# Patient Record
Sex: Male | Born: 1945
Health system: Southern US, Community
[De-identification: ages and names within clinical notes are randomized; demographics above are authoritative.]

## PROBLEM LIST (undated history)

## (undated) DIAGNOSIS — I4891 Unspecified atrial fibrillation: Secondary | ICD-10-CM

## (undated) DIAGNOSIS — E785 Hyperlipidemia, unspecified: Secondary | ICD-10-CM

## (undated) DIAGNOSIS — H269 Unspecified cataract: Secondary | ICD-10-CM

## (undated) DIAGNOSIS — Z8489 Family history of other specified conditions: Secondary | ICD-10-CM

## (undated) DIAGNOSIS — I5042 Chronic combined systolic (congestive) and diastolic (congestive) heart failure: Secondary | ICD-10-CM

## (undated) DIAGNOSIS — I10 Essential (primary) hypertension: Secondary | ICD-10-CM

## (undated) DIAGNOSIS — F419 Anxiety disorder, unspecified: Secondary | ICD-10-CM

## (undated) DIAGNOSIS — K5900 Constipation, unspecified: Secondary | ICD-10-CM

## (undated) DIAGNOSIS — I639 Cerebral infarction, unspecified: Secondary | ICD-10-CM

## (undated) DIAGNOSIS — Q246 Congenital heart block: Secondary | ICD-10-CM

## (undated) DIAGNOSIS — K635 Polyp of colon: Secondary | ICD-10-CM

## (undated) DIAGNOSIS — R011 Cardiac murmur, unspecified: Secondary | ICD-10-CM

## (undated) DIAGNOSIS — D689 Coagulation defect, unspecified: Secondary | ICD-10-CM

## (undated) DIAGNOSIS — I442 Atrioventricular block, complete: Secondary | ICD-10-CM

## (undated) DIAGNOSIS — K5790 Diverticulosis of intestine, part unspecified, without perforation or abscess without bleeding: Secondary | ICD-10-CM

## (undated) DIAGNOSIS — IMO0001 Reserved for inherently not codable concepts without codable children: Secondary | ICD-10-CM

## (undated) DIAGNOSIS — R350 Frequency of micturition: Secondary | ICD-10-CM

## (undated) DIAGNOSIS — K429 Umbilical hernia without obstruction or gangrene: Secondary | ICD-10-CM

## (undated) DIAGNOSIS — F329 Major depressive disorder, single episode, unspecified: Secondary | ICD-10-CM

## (undated) DIAGNOSIS — Z950 Presence of cardiac pacemaker: Secondary | ICD-10-CM

## (undated) DIAGNOSIS — I255 Ischemic cardiomyopathy: Secondary | ICD-10-CM

## (undated) DIAGNOSIS — E0590 Thyrotoxicosis, unspecified without thyrotoxic crisis or storm: Secondary | ICD-10-CM

## (undated) DIAGNOSIS — K222 Esophageal obstruction: Secondary | ICD-10-CM

## (undated) DIAGNOSIS — C8590 Non-Hodgkin lymphoma, unspecified, unspecified site: Secondary | ICD-10-CM

## (undated) HISTORY — DX: Frequency of micturition: R35.0

## (undated) HISTORY — DX: Essential (primary) hypertension: I10

## (undated) HISTORY — PX: COLONOSCOPY: SHX174

## (undated) HISTORY — DX: Polyp of colon: K63.5

## (undated) HISTORY — DX: Hyperlipidemia, unspecified: E78.5

## (undated) HISTORY — DX: Ischemic cardiomyopathy: I25.5

## (undated) HISTORY — DX: Cerebral infarction, unspecified: I63.9

## (undated) HISTORY — DX: Cardiac murmur, unspecified: R01.1

## (undated) HISTORY — DX: Umbilical hernia without obstruction or gangrene: K42.9

## (undated) HISTORY — DX: Congenital heart block: Q24.6

## (undated) HISTORY — DX: Non-Hodgkin lymphoma, unspecified, unspecified site: C85.90

## (undated) HISTORY — DX: Diverticulosis of intestine, part unspecified, without perforation or abscess without bleeding: K57.90

## (undated) HISTORY — DX: Coagulation defect, unspecified: D68.9

## (undated) HISTORY — PX: PACEMAKER PLACEMENT: SHX43

## (undated) HISTORY — DX: Atrioventricular block, complete: I44.2

## (undated) HISTORY — PX: TONSILLECTOMY: SUR1361

## (undated) HISTORY — DX: Esophageal obstruction: K22.2

## (undated) HISTORY — DX: Unspecified cataract: H26.9

## (undated) HISTORY — PX: OTHER SURGICAL HISTORY: SHX169

## (undated) HISTORY — DX: Unspecified atrial fibrillation: I48.91

## (undated) HISTORY — PX: COLECTOMY: SHX59

---
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
## 1996-10-14 DIAGNOSIS — C8590 Non-Hodgkin lymphoma, unspecified, unspecified site: Secondary | ICD-10-CM

## 1996-10-14 HISTORY — DX: Non-Hodgkin lymphoma, unspecified, unspecified site: C85.90

## 2000-01-23 ENCOUNTER — Encounter: Admission: RE | Admit: 2000-01-23 | Discharge: 2000-01-23 | Payer: Self-pay | Admitting: Oncology

## 2000-01-23 ENCOUNTER — Encounter: Payer: Self-pay | Admitting: Oncology

## 2001-07-15 ENCOUNTER — Other Ambulatory Visit: Admission: RE | Admit: 2001-07-15 | Discharge: 2001-07-15 | Payer: Self-pay | Admitting: Gastroenterology

## 2001-07-15 ENCOUNTER — Encounter (INDEPENDENT_AMBULATORY_CARE_PROVIDER_SITE_OTHER): Payer: Self-pay

## 2003-01-05 ENCOUNTER — Encounter: Payer: Self-pay | Admitting: Cardiovascular Disease

## 2003-01-05 ENCOUNTER — Ambulatory Visit (HOSPITAL_COMMUNITY): Admission: RE | Admit: 2003-01-05 | Discharge: 2003-01-06 | Payer: Self-pay | Admitting: Cardiovascular Disease

## 2003-01-06 ENCOUNTER — Encounter: Payer: Self-pay | Admitting: Cardiovascular Disease

## 2003-01-06 HISTORY — PX: CARDIAC CATHETERIZATION: SHX172

## 2003-11-01 ENCOUNTER — Ambulatory Visit (HOSPITAL_COMMUNITY): Admission: RE | Admit: 2003-11-01 | Discharge: 2003-11-01 | Payer: Self-pay | Admitting: Gastroenterology

## 2004-07-08 ENCOUNTER — Inpatient Hospital Stay (HOSPITAL_COMMUNITY): Admission: EM | Admit: 2004-07-08 | Discharge: 2004-07-11 | Payer: Self-pay | Admitting: Emergency Medicine

## 2004-07-09 ENCOUNTER — Encounter (INDEPENDENT_AMBULATORY_CARE_PROVIDER_SITE_OTHER): Payer: Self-pay | Admitting: Cardiology

## 2007-01-16 ENCOUNTER — Encounter: Admission: RE | Admit: 2007-01-16 | Discharge: 2007-01-16 | Payer: Self-pay | Admitting: Family Medicine

## 2007-02-09 ENCOUNTER — Ambulatory Visit (HOSPITAL_COMMUNITY): Admission: RE | Admit: 2007-02-09 | Discharge: 2007-02-09 | Payer: Self-pay | Admitting: Cardiovascular Disease

## 2007-06-25 ENCOUNTER — Encounter: Admission: RE | Admit: 2007-06-25 | Discharge: 2007-06-25 | Payer: Self-pay | Admitting: Otolaryngology

## 2007-09-03 ENCOUNTER — Ambulatory Visit: Payer: Self-pay | Admitting: Gastroenterology

## 2007-09-03 DIAGNOSIS — C8599 Non-Hodgkin lymphoma, unspecified, extranodal and solid organ sites: Secondary | ICD-10-CM

## 2007-09-16 ENCOUNTER — Encounter: Payer: Self-pay | Admitting: Gastroenterology

## 2007-09-16 ENCOUNTER — Ambulatory Visit: Payer: Self-pay | Admitting: Gastroenterology

## 2007-11-11 DIAGNOSIS — I499 Cardiac arrhythmia, unspecified: Secondary | ICD-10-CM | POA: Insufficient documentation

## 2007-11-11 DIAGNOSIS — K5730 Diverticulosis of large intestine without perforation or abscess without bleeding: Secondary | ICD-10-CM | POA: Insufficient documentation

## 2007-11-11 DIAGNOSIS — G459 Transient cerebral ischemic attack, unspecified: Secondary | ICD-10-CM | POA: Insufficient documentation

## 2007-11-11 DIAGNOSIS — K222 Esophageal obstruction: Secondary | ICD-10-CM

## 2008-05-02 ENCOUNTER — Telehealth: Payer: Self-pay | Admitting: Gastroenterology

## 2008-08-18 ENCOUNTER — Encounter: Payer: Self-pay | Admitting: Gastroenterology

## 2009-08-29 ENCOUNTER — Encounter: Payer: Self-pay | Admitting: Gastroenterology

## 2010-03-06 ENCOUNTER — Encounter: Payer: Self-pay | Admitting: Gastroenterology

## 2010-05-22 ENCOUNTER — Ambulatory Visit: Payer: Self-pay | Admitting: Gastroenterology

## 2010-05-22 DIAGNOSIS — K429 Umbilical hernia without obstruction or gangrene: Secondary | ICD-10-CM | POA: Insufficient documentation

## 2010-05-29 ENCOUNTER — Encounter: Admission: RE | Admit: 2010-05-29 | Discharge: 2010-05-29 | Payer: Self-pay | Admitting: Cardiovascular Disease

## 2010-10-12 ENCOUNTER — Encounter: Payer: Self-pay | Admitting: Gastroenterology

## 2010-11-15 NOTE — Assessment & Plan Note (Signed)
Summary: UMBILIcal hernia...em    History of Present Illness Visit Type: Initial Visit Primary GI MD: Melvia Heaps MD [facility] Requesting Jory Tanguma: N/A Chief Complaint: Umbilical hernia and abdominal tightness/constipation History of Present Illness:   Benjamin Mckay is a pleasant 65 year old white male with a history of lymphoma of the colon here for evaluation of an umbilical hernia.  Lymphoma was diagnosed and treated by resection and chemotherapy 13 years ago.  About a year ago he noted some bulging from the umbilical area.  He is without pain.  Last colonoscopy in 2008 showed diverticulosis only.  Patient has a history of an esophageal stricture.  He denies dysphagia.   GI Review of Systems    Reports abdominal pain.      Denies acid reflux, belching, bloating, chest pain, dysphagia with liquids, dysphagia with solids, heartburn, loss of appetite, nausea, vomiting, vomiting blood, weight loss, and  weight gain.      Reports change in bowel habits and  constipation.     Denies anal fissure, black tarry stools, diarrhea, diverticulosis, fecal incontinence, heme positive stool, hemorrhoids, irritable bowel syndrome, jaundice, light color stool, liver problems, rectal bleeding, and  rectal pain.    Current Medications (verified): 1)  Aspirin 81 Mg Tbec (Aspirin) .Marland Kitchen.. 1 By Mouth Once Daily 2)  Toprol Xl 50 Mg Xr24h-Tab (Metoprolol Succinate) .Marland Kitchen.. 1 By Mouth Two Times A Day 3)  Zetia 10 Mg Tabs (Ezetimibe) .Marland Kitchen.. 1 By Mouth Once Daily 4)  Coumadin 5 Mg Tabs (Warfarin Sodium) .Marland Kitchen.. 1 By Mouth Once Daily M,w,th,frid,sat 1/2 Tablet Tues and Sunday 5)  Flomax 0.4 Mg Caps (Tamsulosin Hcl) .... 1 By Mouth Once Daily 6)  Cozaar 50 Mg Tabs (Losartan Potassium) .... 1 By Mouth Once Daily 7)  Diltiazem Hcl 30 Mg Tabs (Diltiazem Hcl) .... 1 By Mouth Once Daily  Allergies (verified): 1)  ! Pcn  Past History:  Past Medical History: Reviewed history from 11/11/2007 and no changes required. Current  Problems:  ESOPHAGEAL STRICTURE (ICD-530.3) TIA (ICD-435.9) UNSPECIFIED CARDIAC DYSRHYTHMIA (ICD-427.9) NEOPLASM, MALIGNANT, COLON (ICD-153.9) Hypertension  Past Surgical History: Appendectomy Pacemaker:  Partial Colon Resection Tonsillectomy  Family History: Reviewed history and no changes required. No FH of Colon Cancer: Family History of Prostate Cancer:father  Social History: Reviewed history and no changes required. Patient has never smoked.  Alcohol Use - no Daily Caffeine Use Illicit Drug Use - no Occupation: [employer] Married   3 boys Smoking Status:  never Drug Use:  no  Review of Systems       The patient complains of arthritis/joint pain.  The patient denies allergy/sinus, anemia, anxiety-new, blood in urine, breast changes/lumps, change in vision, confusion, cough, coughing up blood, depression-new, fainting, fatigue, fever, headaches-new, hearing problems, heart murmur, heart rhythm changes, itching, muscle pains/cramps, night sweats, nosebleeds, shortness of breath, skin rash, sleeping problems, sore throat, swelling of feet/legs, swollen lymph glands, thirst - excessive, urination - excessive, urination changes/pain, urine leakage, vision changes, and voice change.         All other systems were reviewed and were negative   Vital Signs:  Patient profile:   64 year old male Height:      69 inches Weight:      216 pounds BMI:     32 .01 Pulse rate:   68 / minute Pulse rhythm:   regular BP sitting:   142 / 84  (left arm)  Vitals Entered By: Milford Cage NCMA (May 22, 2010 3:53 PM)  Physical Exam  Additional Exam:  On physical exam his healthy-appearing male  Physical Exam: General:   WDWN HEENT:   anicteric.  No pharyngeal abnormalities Neck:   No masses, thyroidmegaly Nodes:   No cervical, axillary, inguinal adenopathy Chest:    Clear to auscultation Cardiac:   No murmurs, gallops, rubs Abdomen:   BS active.  No abd masses,  tenderness, organomegaly; a button size umbilical hernia is present Rectal:   Deferred Extremities:   No cyanosis, clubbing, edema Skeletal:   No deformities Neuro:   Alert, oriented x3.  No focal abnormalities     Impression & Recommendations:  Problem # 1:  HERNIA, UMBILICAL (ICD-553.1) The patient is  asymptomatic.  At this time might do not think that surgical repair as necessary.  Problem # 2:  NEOPLASM, MALIGNANT, COLON (ICD-153.9) History of colonic lymphoma without evidence of recurrence.  Plan follow colonoscopy 2015  Problem # 3:  ESOPHAGEAL STRICTURE (ICD-530.3) Assessment: Comment Only  Problem # 4:  TIA (ICD-435.9) Assessment: Comment Only  Patient Instructions: 1)  Copy sent to : Dr Alanda Amass 2)  Call back as needed  3)  The medication list was reviewed and reconciled.  All changed / newly prescribed medications were explained.  A complete medication list was provided to the patient / caregiver.

## 2010-11-16 NOTE — Letter (Signed)
Summary: [facility]   Imported By: Sherian Rein 08/20/2010 14:26:46  _____________________________________________________________________  External Attachment:    Type:   Image     Comment:   External Document

## 2010-11-21 NOTE — Letter (Signed)
Summary: [facility]  [facility]   Imported By: Lennie Odor 11/12/2010 11:55:54  _____________________________________________________________________  External Attachment:    Type:   Image     Comment:   External Document

## 2011-02-26 NOTE — Cardiovascular Report (Signed)
NAME:  Benjamin Mckay, Benjamin Mckay               ACCOUNT NO.:  [id number]   MEDICAL RECORD NO.:  [id number]          PATIENT TYPE:  AMB   LOCATION:  SDS                          FACILITY:  [facility]   PHYSICIAN:  Nanetta Batty, M.D.   DATE OF BIRTH:  1946-05-15   DATE OF PROCEDURE:  02/09/2007  DATE OF DISCHARGE:                            CARDIAC CATHETERIZATION   PROCEDURE:  Cerebral angiogram, femoral arteriogram, distal abdominal  aortogram.   Mr. Radle is a very pleasant 65 year old gentleman with a history of  noncritical CAD, renal artery stenosis, carotid disease followed by  duplex ultrasound and complete heart block __________ pacemaker therapy.  He has had serial duplexes of his carotids and renals and presents now  for angiography of both to find his anatomy in light of progression of  the disease by duplex ultrasound.   PROCEDURE DESCRIPTION:  The patient was brought to the __________  angiographic suite in the postabsorptive state.  He was not  premedicated.  His right groin was prepped and shaved in the usual  sterile fashion.  Xylocaine 1% was used for local anesthesia.  A 5  French sheath was inserted in the right femoral artery using the  standard Seldinger technique.  A 5 French long pigtail catheter was used  for arch angiography and __________ abdominal aortography.  JB1 catheter  over a Simmons catheter was used for selective angiography of the  carotid and vertebral arteries.  Intra and extra femoral views were  taken.  __________ for the entirety of the case.  Retrograde aortic  pressures monitored at the end of the case.   ANGIOGRAPHIC RESULTS:  1. RCA aortograms:  This was a type 1 bovine arch.  2. Distal abdominal aorta:  Renal arteries appear to be at most 20-30%      stenosis, although the ostium of the right renal artery is not well-      visualized.  The inferior abdominal aorta __________  changes.  3. Right vertebral:  Dominant vessel with thermal intracranial      anatomy.  4. Right carotid:  Sixty to seventy percent mid proximal right ICA      stenosis.  The right carotid supplied the anterior cerebral.  5. Left carotid:  Widely patent internal carotid with normal      intracerebral anatomy.  6. Left vertebral:  Nondominant __________ disease.   IMPRESSION:  Mr. Wolters has moderate right internal carotid artery  stenosis in the 60-60+% range.  It does not appear to be clear enough to  warrant intervention in an asymptomatic individual.  This renal artery  likewise appears  to have noncritical disease.  Continue medical therapy with surveillance  duplex ultrasound will be recommended.  The patient will be discharged  home later today and will follow up with Dr. Alanda Amass in the office.   Sheath was removed.  Pressure was applied to achieve hemostasis.  The  patient left the lab in stable condition.      Nanetta Batty, M.D.  Electronically Signed     JB/MEDQ  D:  02/09/2007  T:  02/09/2007  Job:  [id number]  cc:   Lennis P. Darrold Span, M.D.  Pramod P. Pearlean Brownie, MD

## 2011-02-26 NOTE — Assessment & Plan Note (Signed)
[facility]                         GASTROENTEROLOGY OFFICE NOTE   NAME:Benjamin Mckay, Benjamin Mckay                      MRN:          [id number]  DATE:09/03/2007                            DOB:          [birth year]    PROBLEM:  History of colonic lymphoma.   Benjamin Mckay is a 65 year old white male here for followup colonoscopy.  He has a history of lymphoma of the colon which was treated by resection  and chemotherapy; this occurred almost 10 years ago.  He also has a  history of esophageal stricture which was dilated several years ago.  He  does have some mild dysphagia to solids.  Recent barium swallow  apparently was normal.  He has no lower GI complaints including a change  in bowel habits, abdominal pain, melena or hematochezia.   PAST MEDICAL HISTORY:  Pertinent for:  1. Hypertension.  2. Arrhythmias.  3. He has had a TIA for which he is on Coumadin.  4. He is status post appendectomy.  5. He is status post pacemaker insertion.   FAMILY HISTORY:  Pertinent for father with prostate cancer and mother  with heart disease.   MEDICATIONS:  Baby aspirin, Toprol-XL, Coumadin, Flomax and Crestor.   ALLERGIES:  He is allergic to PENICILLIN.   SOCIAL HISTORY:  He neither smokes or drinks.  He is divorced and works  in Airline pilot.   REVIEW OF SYSTEMS:  Reviewed and is negative.   PHYSICAL EXAMINATION:  VITAL SIGNS:  Pulse 66, blood pressure 124/64,  weight 210.  HEENT:  EOMI.  PERRLA.  Sclerae are anicteric.  Conjunctivae are pink.  NECK:  Supple without thyromegaly, adenopathy or carotid bruits.  CHEST:  Clear to auscultation and percussion without adventitious  sounds.  CARDIAC:  Regular rhythm; normal S1, S2.  There are no murmurs, gallops  or rubs.  ABDOMEN:  Bowel sounds are normoactive.  Abdomen is soft, nontender and  nondistended.  There are no abdominal masses, tenderness, splenic  enlargement or hepatomegaly.  EXTREMITIES:  Full range of motion.   No cyanosis, clubbing or edema.  RECTAL:  Deferred.   IMPRESSION:  1. History of lymphoma of the large intestine.  2. Recurrent dysphagia -- likely secondary to recurrent esophageal      stricture.  3. History of arrhythmias -- on Coumadin.   RECOMMENDATION:  1. Colonoscopy.  2. Esophageal dilatation (to be done at the same time as the      colonoscopy, while off Coumadin).     Barbette Hair. Arlyce Dice, MD,FACG  Electronically Signed    RDK/MedQ  DD: 09/03/2007  DT: 09/04/2007  Job #: [id number]   cc:   Brett Canales A. Cleta Alberts, M.D.  Richard A. Alanda Amass, M.D.  Lennis P. Darrold Span, M.D.

## 2011-03-01 NOTE — Cardiovascular Report (Signed)
NAME:  Benjamin Mckay, Benjamin Mckay NO.:  [id number]   MEDICAL RECORD NO.:  [id number]                   PATIENT TYPE:  OIB   LOCATION:  2856                                 FACILITY:  [facility]   PHYSICIAN:  Richard A. Alanda Amass, M.D.          DATE OF BIRTH:  [birth year]   DATE OF PROCEDURE:  01/05/2003  DATE OF DISCHARGE:                              CARDIAC CATHETERIZATION   PROCEDURE:  End-of-life generator explant with implantation of new VVIR  Pacesetter Verity ADX-XL-SR, model #5157M/S, pulse generator SN:  [id number], 6-  mm pin connector, unipolar.   PRIMARY PHYSICIAN:  Richard A. Alanda Amass, M.D.   COMPLICATIONS:  None.   ESTIMATED BLOOD LOSS:  Approximately 30 cc.   ANESTHESIA:  5 mg of Valium p.o. premedication, 1% local Xylocaine, Versed 3  mg IV, Nubain 4 mg IV, fentanyl 75 mcg IV in divided doses.   PREOPERATIVE DIAGNOSES:  1. Congenital heart block with escape junctional rhythm.  2. Past history of variable atrial inexcitability with prior atrial capture     with pacing.  3. Atrial fibrillation.  4. Recurrent junctional rhythm, intraventricular conduction delay with     symptomatic bradycardia and presyncope associated with end-of-life pulse     generator.  5. Remote PTVP, January 23, 1983, for symptomatic congenital complete heart     block with presyncope and 7-second sinus pauses (678 silicone SE 6-mm pin     connector ventricular lead, passive fixation).  6. End-of-life generator change and DDD upgrade to [facility] III,     July 18, 1994 (#2029M/S generator) with new atrial Pacesetter 1142T;     [id number] electrode.  7. Recent onset of permanent atrial fibrillation.  8. Recent positive Cardiolite for inferior ischemia, January 03, 2003.  9. Status post non-Hodgkin lymphoma, GI treated with colon resection, Dr.     Gerrit Friends, February 03, 1997.  Subsequent 3 cycles of adjuvant CHOP     chemotherapy through September 1998.  No recurrence.   Followed by Dr.     Ottie Glazier P. Livesay.   PROCEDURE:  New pulse generator Verity ADX-XL-SR Pacesetter, #5157M/S.   UNIPOLAR THRESHOLDS:  Ventricular threshold 1.3 to 1.4 volts, R wave 5-6 MV,  impedance 400 ohm.   Good R waves for sensing.   Atrial fibrillation on atrial electrograms.   PROCEDURE:  The patient recently became symptomatic with dizziness and  presyncope, and was found as an outpatient to have end-of-life generator  when seen December 31, 2002.  He was not pacer dependent and was reprogrammed  to the VVI mode, and found to be in underlying atrial fibrillation.  Severe  left atrial enlargement on 2D echocardiogram.  He was admitted for EOL  generator change.  He had not come back for followup for 2 years prior to  this visit, and had not called in on [place].   The patient was brought to the second floor CP lab in the postabsorptive  state after 5 mg of Valium p.o. premedication.  He was given vancomycin 1 g  IV on call to the laboratory for prophylaxis with history of penicillin  allergy.  The left anterior chest was prepped, draped in the usual manner.  One percent Xylocaine was used for local anesthesia.  X-rays were reviewed  and fluoroscopy was reviewed with no lead fractures seen.  The patient was  backup pacing VVI.  A left infraclavicular curvilinear transverse incision  was performed above the previous scar, and above the pulse generator.  Electrocautery was used sparingly to control hemostasis, and blunt  dissection was used to expose the fibrous generator capsule.  The generator  capsule was incised and the generator was freed up using blunt dissection,  and delivered from the pocket.  The patient had underlying junctional and/or  ventricular escape rhythm intermittently, but was temporarily paced through  the ventricular electrode.  Inspection of the ventricular electrode showed  it to be intact.  A 6-mm pin connector silicone __________ LIMA as outlined   above.  The atrial electrode was also intact and had 2 upsizing sleeves on  it that were both removed.  Atrial electrograms were done showing atrial  fibrillation.  The atrial electrode was capped with a silicone cap and two  #1 silk suture ties.  Ventricular thresholds were performed showing adequate  R waves and good chronic thresholds.  The pocket was irrigated with 500 mg  of kanamycin solution.  The new generator was hooked to the [phone] silicone  ventricular electrode, and a single hex nut tightened.  Some silicone glue  was used around the pin connector insulation to ensure good integrity.  The  generator was delivered into the pocket.  Sponge count was correct.  The  subcutaneous tissue was closed with 2 separate running layers of 2-0 Dexon  suture, and the skin was closed with 5-0 subcuticular Dexon sutures.  Steri-  Strips were applied.  Fluoroscopy showed good position of the electrode and  generator.  The patient tolerated the procedure well.  He was VVIR pacing at  the end of the procedure, and reprogrammed in the lab.  Repeat threshold was  1.3 to 1.4 volts.   Magnet rate of BOL equals 100, magnet rate of EOL drops to 85, and voltage  will drop to less than 2.45 volts.   This is a unipolar generator because of the patient's unipolar electrode.                                                 Richard A. Alanda Amass, M.D.    RAW/MEDQ  D:  01/05/2003  T:  01/06/2003  Job:  [id number]   cc:   Darlin Priestly, M.D.  [phone] [address]  [place]  [place] [address]  Fax: [phone]   Lennis P. Darrold Span, M.D.  [address] [facility]  [place]  [place] [address]  Fax: [phone]

## 2011-03-01 NOTE — Discharge Summary (Signed)
NAMEKELSIE, Benjamin Mckay               ACCOUNT NO.:  [id number]   MEDICAL RECORD NO.:  [id number]          PATIENT TYPE:  INP   LOCATION:  3034                         FACILITY:  [facility]   PHYSICIAN:  Pramod P. Pearlean Brownie, MD    DATE OF BIRTH:  November 19, 1945   DATE OF ADMISSION:  07/07/2004  DATE OF DISCHARGE:  07/11/2004                                 DISCHARGE SUMMARY   DISCHARGE DIAGNOSES:  1.  Left hemispheric transient ischemic attacks.  2.  Atrial fibrillation.  3.  Hypertension.  4.  Hyperlipidemia.  5.  History of colon lymphoma.  6.  Pacemaker.  7.  Status post appendectomy.  8.  Status post C-spine surgery.  9.  Status post pacemaker surgery.   STUDIES PERFORMED:  1.  CT of the head on admission showed no acute intracranial abnormality.      CT angio of the head and neck showed moderate stenosis of the proximal      right internal carotid artery about 70%.  Bovine art anatomy, no      stenosis on the left and cannot completely exclude mild stenosis within      the proximal left MCA in one segment.  May consider conventional      angiography.  2.  EKG shows electronic ventricular pacemaker, no old tracing to compare.  3.  Transesophageal echocardiogram performed by Dr. Cristy Hilts. Ganji shows EF 55      to 65% with left atrial appendage, function severely reduced.      Spontaneous echo contrast  or smoke in the left atrial appendage.  It is      felt that reduced LA and LAA function along with the presence of      spontaneous contrast is probably etiology for TIA.  4.  Carotid Doppler with right 60 to 80% ICA stenosis in the mid to upper      end of the scale.  No significant left ICA stenosis.  Vertebral artery      flow antegrade bilaterally.   LABORATORY DATA:  Antithrombin III 99.  ANA negative.  Lupus anticoagulant  not detected.  Factor V Leiden pending.  Hemoglobin A1C 5.4.  Homocystine  10.6.  Lipid profile with cholesterol 172, triglycerides 53, HDL 35 and LDL  126.  TSH was  normal.  Urine drug screen was negative.  CBC normal.  Differential normal except for elevated neutrophils of 80 on admission.  Coagulation studies normal on admission.  Chemistry with glucose 117,  otherwise normal.  Liver function tests normal.  INR the day of discharge  2.8.   HISTORY OF PRESENT ILLNESS:  [name] is a 65 year old right-handed  white male who had recurrent episodes of right body numbness, weakness and  slurred speech the evening of admission.  His symptoms began at 5 p.m. and  for the next five hours, had recurrent episodes of transient five to 10  minute right-sided body weakness, numbness and slurred speech, each lasting  five to 10 minutes.  These episodes occurred multiple times.  When neurology  saw him he had been in the  emergency room two to three hours with no  recurrent symptoms.  He denied previous history of stroke, TIA, blurred  vision or other neurologic problem.  He was not a SAINT-II or TPA candidate  secondary to quick resolution of symptoms.   HOSPITAL COURSE:  Actual stroke could not be documented as the patient could  not have MRI secondary to pacemaker.  It was thought his TIA symptoms were  directly correlated with atrial fibrillation.  The patient was started on IV  heparin to Coumadin.  Cardiology consult with Dr. Alanda Amass, cardiologist,  was obtained.  TEE was performed and did verify atrial smoke and  fibrillation which would probably be etiology of TIAs.  Plans are for  Coumadin, keep INR between 2 and 3 with aspirin.  Dr. Alanda Amass to follow  up.  Neurologically the patient's deficits have resolved and plans are to  discharge him home with return to work next week.  He needs tight risk  factor control.   CONDITION ON DISCHARGE:  Neurologically normal.   PLAN:  1.  Discharge home with family.  Return to work on Tuesday, July 17, 2004.  2.  Coumadin for secondary stroke prevention.  INR goal 2 to 3.  Dr. Pearletha Furl.  Alanda Amass to follow for lab work on Friday and Monday.  3.  Follow-up with Dr. Janalyn Shy P. Sethi in two months.  Call for an      appointment.   DISCHARGE MEDICATIONS:  1.  Coumadin 5 mg a day.  2.  Toprol 50 mg a day.  3.  Lipitor 80 mg a day.  4.  Aspirin 81 mg a day.       SB/MEDQ  D:  07/11/2004  T:  07/11/2004  Job:  [id number]   cc:   Pramod P. Pearlean Brownie, MD  Fax: [phone]   Lennis P. Darrold Span, M.D.  [address] [facility]  [place]  [place] [address]  Fax: [phone]   Richard A. Alanda Amass, M.D.  [phone] [address], [address]  [place]  [place] [address]  Fax: [phone]

## 2011-03-01 NOTE — Discharge Summary (Signed)
NAME:  Benjamin Mckay, Benjamin Mckay NO.:  [id number]   MEDICAL RECORD NO.:  [id number]                   PATIENT TYPE:  OIB   LOCATION:  3712                                 FACILITY:  [facility]   PHYSICIAN:  Richard A. Alanda Amass, M.D.          DATE OF BIRTH:  May 11, 1946   DATE OF ADMISSION:  01/05/2003  DATE OF DISCHARGE:  01/06/2003                                 DISCHARGE SUMMARY   DISCHARGE DIAGNOSES:  1. End-of-life pacemaker, status post generator change this admission.  2. Moderate coronary disease by catheterization this admission.  3. Mild left ventricular dysfunction with an ejection fraction of 45-50% at     catheterization this admission.  4. Peripheral vascular disease with greater than 80% right renal artery     stenosis by angiogram this admission.  5. Treated hyperlipidemia.  6. PENICILLIN allergy.  7. History of colon cancer, status post colectomy in 1998.   HOSPITAL COURSE:  The patient is a 65 year old male followed by Dr.  Alanda Amass with a history of congenital heart block.  He had his initial  pacer in April 1984.  He had a generator upgrade to a pacesetter synchrony  October 1995.  He was recently seen in the office and found to be near end-  of-life.  He is admitted electively now for generator change.  He also has a  family history of coronary disease and he had a Cardiolite study done on  January 03, 2003, which was abnormal with suggestion of inferior ischemia.  It  was decided to proceed with diagnostic catheterization also during this  admission.   The patient was admitted January 05, 2003, as an outpatient for generator  change.  He had a new VVIR pacesetter Verity ADx XL implanted by Dr.  Alanda Amass without complications.  He was set up for diagnostic angiogram  which was done January 06, 2003.  This revealed a 40-50% RCA, 40% circumflex,  80% diagonal, 70% mid LAD with an EF of  45-50% and an incidental finding of  an 80% right renal  artery stenosis.   The plan is for medical therapy at this time.  He will have renal Dopplers  as an outpatient.  Beta-blocker and Plavix were added.  Dr. Alanda Amass will  adjust Coumadin when he sees him as an outpatient.  He was in underlying  atrial fibrillation when his generator was placed.  The patient is  discharged January 06, 2003, in the evening.  He has been instructed that he  should not work x1 week.   DISCHARGE MEDICATIONS:  1. Aspirin 81 mg daily.  2. Plavix 75 mg daily.  3. Lipitor 20 mg daily.  4. Toprol XL 50 mg daily.   LABORATORY DATA:  White count 5.9, hemoglobin 16, hematocrit 47.5, platelets  196. INR 1.08. TSH 1.27.  BUN 16, creatinine 0.8, sodium 137, potassium 4.8.  Liver functions are normal.  Cholesterol is 185, HDL 45,  LDL 131. Urinalysis  is unremarkable.    DISPOSITION:  The patient is discharged in stable condition and will follow  up with Dr. Alanda Amass on January 24, 2003, at 10:30 a.m.  He will have renal  Dopplers as an outpatient January 17, 2003, at 10 a.m.     Abelino Derrick, P.A.-C                    Richard A. Alanda Amass, M.D.    Lenard Lance  D:  01/06/2003  T:  01/07/2003  Job:  [id number]

## 2011-03-01 NOTE — Cardiovascular Report (Signed)
NAME:  Benjamin Mckay, Benjamin Mckay NO.:  [id number]   MEDICAL RECORD NO.:  [id number]                   PATIENT TYPE:  OIB   LOCATION:  3712                                 FACILITY:  [facility]   PHYSICIAN:  Richard A. Alanda Amass, M.D.          DATE OF BIRTH:  May 30, 1946   DATE OF PROCEDURE:  01/06/2003  DATE OF DISCHARGE:                              CARDIAC CATHETERIZATION   PROCEDURES:  Retrograde central aortic catheterization, selective coronary angiography by  Judkins technique, pre and post intracoronary nitroglycerin administration,  left ventricular angiogram by RAO and LAO projection, subselective left  internal mammary artery and right internal mammary artery, abdominal aortic  angiogram midstream projection, selective right and left renal angiograms by  hand injection.   DESCRIPTION OF PROCEDURE:  The patient brought to the second floor CP lab in the postabsorptive state  after 5 mg of Valium p.o. premedication.  He was given 2 mg of Versed for  sedation, 2 mg of Nubain for sedation during the procedure.  The right groin  was prepped, draped in the usual manner.  One percent Xylocaine was used for  local anesthesia.  The RFA was entered with a single puncture using an 18  thin wall needle and a 6-French short Daig side arm sheath was inserted  without difficulty.  Catheterization was done with a 6-French 4-cm taper.  Preformed coronary and pigtail catheters using nonionic dye throughout the  procedure.  LV angiogram was done in the RAO and LAO projections, 25 mL at  14 mL per second, 20 mL 12 mL per second.  Pullback pressure of the CA was  performed.  It showed no gradient across the aortic valve.  Subselective  LIMA and RIMA were done with the right coronary catheter. Abdominal  angiogram was done in the midstream.  Projection at 25 mL 20 mL per second.  Selective left and right renal angiography was done by hand injection with  the right coronary  artery catheter.  The catheter was removed.  Side arm  sheath was flushed.  The patient was given 200 mcg of IC nitroglycerin in  the left coronary with repeat injections done in the lab.  Catheters were  removed.  Side arm sheath was flushed.  The patient tolerated the procedure  well and he was transferred to the holding area for sheath removal and  pressure hemostasis.   PRESSURES:  1. LV:  132/0 mmHg.   1. CA:  132/72 mmHg.   1. There is no gradient across the aortic valve on a catheter pullback.   1. Pressures were done post IC nitroglycerin.  Prior to this, they were 150     and 160 systolic.   LEFT VENTRICULAR ANGIOGRAM:  LV angiogram demonstrated a pacemaker-induced left bundle-branch block  pattern.  There was a synchronous contraction of the septum and hypokinesis  of the distal septum, apical area, and mid inferior wall.  It is  difficult  to tell how much of this was pacemaker-induction related.  Overall EF is  approximately 45% to 50%, and there was no mitral regurgitation.  The  patient was VVI pacing during the procedure.   ABDOMINAL ANGIOGRAM:  Abdominal angiogram demonstrated mild infrarenal atherosclerotic disease,  patent celiac, SMA, and IMAs.  There was borderline LRAS and suspected  significant RRIS on abdominal angiograms and selectives were performed.  This revealed 20% to 30% narrowing of the left single renal artery.  The  right renal artery had a small upper pole branch that was normal, and a very  large middle and lower pole branch (accessory) that had a greater than 80%  ostial and proximal eccentric stenosis.   FLUOROSCOPY:  Fluoroscopy showed good position of the patient's atrial and ventricular  pacer wires that had been previously placed.   There was +1 to +2 coronary calcification seen.   CORONARY ANGIOGRAPHY:  The main left coronary had no significant stenosis.   The left anterior descending artery had irregularities throughout the   proximal third without high grade stenosis.  There was an area of at least  70% narrowing on worst views at the junction of the proximal and mid third  at the level of the small third diagonal branch.  This was eccentric best  seen in the LAO views, and there was good flow.  The LAD beyond this showed  40% irregularity and narrowing in the mid portion and in the junction of the  distal third.  There was good flow in the apex where the vessel bifurcated.   The first diagonal was a small vessel, thin, with an 80% ostial proximal  lesion.   The second diagonal after the SP2 was moderately large, bifurcated, and had  40% proximal narrowing.   The third diagonal was a small thin branch that arose just at the area of  70% LAD narrowing with good flow.   There was a small D branch that was normal.   The circumflex had 40% narrowing in the proximal third, and another  eccentric 40% narrowing before the small OM1.  There was another 40%  irregularity and narrowing in the mid circumflex.  The OM1 had 50% narrowing  which was relatively small.  The OM2 was very small, and the OM3 was  moderate sized, and the distal circumflex bifurcated into a large branch.  The PABG branch from the mid circumflex also gave an LA branch off with no  significant stenosis.   The right coronary was a dominant vessel.  There was approximately 40%  narrowing without catheter dampening in the ostial and very proximal  portion.  Flush injections as well as selective injections and pullbacks  were performed.  There was no catheter damping.   The remainder of the right coronary had no significant stenosis.  There was  a small-to-moderate sized PVA and small bifurcating PLA branch.   DISCUSSION:  The patient's history is well outlined.  He is a 65 year old divorced father  of 3 with 3 grandchildren.  He has congenital heart block which was symptomatic with significant sinus pauses over 7 seconds, prompting VVI   pacer in 1984.  His son has the same problem and has a pacer as well.   The patient had his first pacer January 23, 1983.  He then had a DDD upgrade  on July 18, 1994, when he did have atrial excitability electrically,  although P waves were not seen on the surface EKG.  He  was able to pace DDD  and subsequently VDD.  However, he has an underlying junctional rhythm and  on recent evaluations, has been found to be in atrial fibrillation, when, on  office visit of December 31, 2002, he had end-of-life pulse generator.  He was  admitted because of symptoms of dizziness and presyncope with junctional  bradycardia and intermittent loss of capture secondary to severe generator  battery depletion (the patient had not returned for followup in 2 years or  [place] despite prompting).  On January 05, 2003, he underwent end-of-life  generator explant.  He was found to be in atrial fibrillation.  The atrial  lead was capped.  His 678 silicone segment to LIMA, 6-mm lead was retained  because of excellent chronic thresholds, and a new VVIR generator was  implanted in the left side without problems.  In preparation for this he was  found to have hyperlipidemia (is a nonsmoker) with LDL of 130, cholesterol  less than 200, HDL of 45.  A Cardiolite showed inferior ischemia by  Persantine technique.  It was felt best to proceed with diagnostic  catheterization in this setting.   The patient also has a history of prior non-Hodgkin lymphoma, treated with a  colon resection February 03, 1997, by Dr. Velora Heckler, with subsequent CHOP  through September 1998 with no recurrence, under the followup and care of  Dr. Ottie Glazier P. Livesay.   The patient has diffuse atherosclerotic coronary disease, the majority of  which is not critical or angiographically significant.  He does, however,  have a borderline lesion of the LAD at the junction of the mid third near  the third diagonal.  He does have diffuse atherosclerotic  changes of his  arteries as outlined.  He has high-grade right renal artery stenosis with  moderate hypertension, and normal renal function.   At this point, I would recommend medical therapy.  I would institute aspirin  and Plavix, since his pacer site is stable, and this was not a new pacer but  a generator change (less chance of bleeding).  The patient will need to be  assessed for possible need for Coumadin therapy with his underlying atrial  enlargement and atrial fibrillation, which is chronic and permanent (age  28).  We will institute statin therapy as well.  We will schedule him for  outpatient Dopplers to assess his renal artery stenosis and follow him up  medically.  All of these findings will be explained in detail to the patient  and his family.   CATHETERIZATION DIAGNOSES:  1. Atherosclerotic heart disease, positive Cardiolite for inferior ischemia,    January 03, 2003, possibly related to pacemaker-induced left bundle-branch     block.   1. Mild-to-moderate diffuse coronary artery disease, borderline left     anterior descending lesion, asymptomatic.   1. Ejection fraction of 45% to 50% with pacer-induced left bundle-branch     block.   1. Mild-to-moderate hypertension, probable high-grade right renal artery     stenosis.   1. Status post _______ with atrial paralysis, January 23, 1983, end-of-life     generator July 18, 1994, end-of-life generator January 05, 2003.   1. History of non-Hodgkin lymphoma treated with colon resection, February 03, 1997, subsequent chemotherapy with CHOP x3, disease-free.   1.     Nonsmoker.   1. Hyperlipidemia.  Richard A. Alanda Amass, M.D.    RAW/MEDQ  D:  01/06/2003  T:  01/07/2003  Job:  [id number]   cc:   Darlin Priestly, M.D.  [phone] [address]  [place]  [place] [address]  Fax: [phone]   Lennis P. Darrold Span, M.D.  [address] [facility]  [place]  [place] [address]  Fax:  [phone]   [facility]

## 2011-03-01 NOTE — H&P (Signed)
Benjamin Mckay, Benjamin Mckay               ACCOUNT NO.:  [id number]   MEDICAL RECORD NO.:  [id number]          PATIENT TYPE:  EMS   LOCATION:  MAJO                         FACILITY:  [facility]   PHYSICIAN:  Pramod P. Pearlean Brownie, MD    DATE OF BIRTH:  July 29, 1946   DATE OF ADMISSION:  07/07/2004  DATE OF DISCHARGE:                                HISTORY & PHYSICAL   REFERRING PHYSICIAN:  Paula Libra, M.D.   REASON FOR REFERRAL:  TIA.   HISTORY OF PRESENT ILLNESS:  Benjamin Mckay is a 65 year old, pleasant,  Caucasian male who developed recurrent episodes of right body numbness,  weakness, and slurred speech this evening.  His symptoms began at 5 p.m. and  for the next five hours he had recurrent episodes of transient, 5 to 10  minutes, right sided body weakness, numbness, and slurred speech lasting 5  to 10 minutes.  This occurred multiple times.  Since the last 2-3 hours, he  has been in the emergency room he has had no recurrent symptoms.  He denies  any prior history of strokes, TIAs, blurred vision, focal extremity weakness  or numbness.   PAST MEDICAL HISTORY:  1.  Hypertension.  2.  Hyperlipidemia.  3.  Pacemaker.  4.  Atrial fibrillation.  5.  Colon lymphoma, status post surgery and chemo.   PAST SURGICAL HISTORY:  1.  Appendectomy.  2.  C spine surgery.  3.  Pacemaker surgery.   MEDICATION ALLERGIES:  None.   HOME MEDICATIONS:  Aspirin, Plavix, Lipitor, and Toprol.   SOCIAL HISTORY:  The patient works full time.  He does not smoke or drink.   CARDIOLOGIST:  Richard A. Alanda Mckay, M.D.   REVIEW OF SYSTEMS:  Not significant for any chest pain, shortness of breath,  cough, diarrhea, or fatigue.   PHYSICAL EXAMINATION:  GENERAL:  Reveals a pleasant middle-aged gentleman  not in distress.  VITAL SIGNS:  Afebrile, pulse rate is 68 per minute irregular, blood  pressure 137/89, respiratory rate 18 per minute, sat is 97% on room air.  Distal pulses well felt.  HEAD:  Nontraumatic.  NECK:   Supple without bruit.  ENT:  Unremarkable.  CARDIAC:  Normal.  No murmur or gallop.  LUNGS:  Clear to auscultation.  NEUROLOGIC:  He is awake, alert, oriented x 3 with normal speech and  language function.  There is no aphasia, apraxia, or dysarthria.  Pupils  equal reactive to light and accommodation.  Visual acuity and fields  adequate.  Face is symmetric except for minimal right lower facial  asymmetry.  Tongue is in midline.  Palate elevates normally.  Motor system  exam reveals normal upper extremity drift.  Symmetric strength, tone,  reflexes, coordination, sensation.  Plantars are both downgoing.  Finger-to-  nose and alternating  coordination accurate.  Gait was not tested.   DATA REVIEWED:  Non-contrast CAT scan of the head, done today, reveals no  evidence of acute infarction.   An EKG reveals no evidence of acute ischemia.   A comprehensive metabolic panel, CBC with diff, and coagulation labs and  urine tox screen  are all normal.   IMPRESSION:  A 65 year old gentleman with recurrent left hemispheric  transient ischemic attacks with atrial fibrillation likely of cardioembolic  etiology.   PLAN:  The patient is being admitted to the stroke team.  He will be started  on IV heparin for short term anticoagulation, switched to p.o. Warfarin.  I  strongly counseled the patient to consider long term Warfarin for  anticoagulation given his history of Afib and TIAs.  The patient appears to  have some apprehension related to his job.  We will consult with his  cardiologist, Dr. Alanda Mckay, also to give his input about his need for long  term anticoagulation.  We can not obtain an MRI secondary to his pacemaker,  hence, we will obtain CT angiogram to image his vasculature as well as  carotid and transcranial Doppler studies.  Check fasting lipid profile,  hemoglobin A1c, and homocystine.  I had a long discussion with the patient  and his daughter regarding his symptoms.  Discussed  the plan, further  evaluation and treatment and answered questions.       PPS/MEDQ  D:  07/08/2004  T:  07/08/2004  Job:  [id number]   cc:   Benjamin Mckay, M.D.  [phone] [address]  [place]  [place] [address]  Fax: [phone]

## 2011-04-25 ENCOUNTER — Telehealth: Payer: Self-pay | Admitting: Gastroenterology

## 2011-04-25 NOTE — Telephone Encounter (Signed)
Pt states he has a h/o colon cancer. Pt reports he has been having rectal bleeding and blood in his stools for the past 2 months. He has been having abdominal pain also. Requesting appt with Dr. Arlyce Dice. Appt given for pt to see Dr. Arlyce Dice 04/26/11@9am . Pt aware of appt date and time.

## 2011-04-26 ENCOUNTER — Encounter: Payer: Self-pay | Admitting: Gastroenterology

## 2011-04-26 ENCOUNTER — Ambulatory Visit (INDEPENDENT_AMBULATORY_CARE_PROVIDER_SITE_OTHER): Payer: Medicare Other | Admitting: Gastroenterology

## 2011-04-26 VITALS — BP 136/82 | HR 68 | Ht 69.0 in | Wt 217.8 lb

## 2011-04-26 DIAGNOSIS — K222 Esophageal obstruction: Secondary | ICD-10-CM

## 2011-04-26 DIAGNOSIS — K625 Hemorrhage of anus and rectum: Secondary | ICD-10-CM

## 2011-04-26 MED ORDER — HYDROCORTISONE ACETATE 25 MG RE SUPP: 25.0000 mg | Freq: Two times a day (BID) | RECTAL | Status: DC

## 2011-04-26 MED ORDER — PEG-KCL-NACL-NASULF-NA ASC-C 100 G PO SOLR: 1.0000 | Freq: Once | ORAL | Status: DC

## 2011-04-26 NOTE — Patient Instructions (Signed)
Colonoscopy A colonoscopy is an exam to evaluate your entire colon. In this exam, your colon is cleansed. A long fiberoptic tube is inserted through your rectum and into your colon. The fiberoptic scope (endoscope) is a long bundle of enclosed and very flexible fibers. These fibers transmit light to the area examined and send images from that area to your caregiver. Discomfort is usually minimal. You may be given a drug to help you sleep (sedative) during or prior to the procedure. This exam helps to detect lumps (tumors), polyps, inflammation, and areas of bleeding. Your caregiver may also take a small piece of tissue (biopsy) that will be examined under a microscope. BEFORE THE PROCEDURE  A clear liquid diet may be required for 2 days before the exam.   Liquid injections (enemas) or laxatives may be required.   A large amount of electrolyte solution may be given to you to drink over a short period of time. This solution is used to clean out your colon.   You should be present 1 prior to your procedure or as directed by your caregiver.   Check in at the admissions desk to fill out necessary forms if not preregistered. There will be consent forms to sign prior to the procedure. If accompanied by friends or family, there is a waiting area for them while you are having your procedure.  LET YOUR CAREGIVER KNOW ABOUT:  Allergies to food or medicine.  Medicines taken, including vitamins, herbs, eyedrops, over-the-counter medicines, and creams.   Use of steroids (by mouth or creams).   Previous problems with anesthetics or numbing medicines.   History of bleeding problems or blood clots.  Previous surgery.   Other health problems, including diabetes and kidney problems.   Possibility of pregnancy, if this applies.   AFTER THE PROCEDURE  If you received a sedative and/or pain medicine, you will need to arrange for someone to drive you home.   Occasionally, there is a little blood passed  with the first bowel movement. DO NOT be concerned.  HOME CARE INSTRUCTIONS  It is not unusual to pass moderate amounts of gas and experience mild abdominal cramping following the procedure. This is due to air being used to inflate your colon during the exam. Walking or a warm pack on your belly (abdomen) may help.   You may resume all normal meals and activities after sedatives and medicines have worn off.   Only take over-the-counter or prescription medicines for pain, discomfort, or fever as directed by your caregiver. DO NOT use aspirin or blood thinners if a biopsy was taken. Consult your caregiver for medicine usage if biopsies were taken.  FINDING OUT THE RESULTS OF YOUR TEST Not all test results are available during your visit. If your test results are not back during the visit, make an appointment with your caregiver to find out the results. Do not assume everything is normal if you have not heard from your caregiver or the medical facility. It is important for you to follow up on all of your test results. SEEK IMMEDIATE MEDICAL CARE IF:  You pass large blood clots or fill a toilet with blood following the procedure. This may also occur 10 to 14 days following the procedure. This is more likely if a biopsy was taken.   You develop abdominal pain that keeps getting worse and cannot be relieved with medicine.  Document Released: 09/27/2000 Document Re-Released: 12/25/2009 [facility] Patient Information 2011 [place], [place]. Your colonoscopy/Endoscopy is scheduled on 05/15/2011 at  2pm. You can pick up your MoviPrep from your pharmacy today We will contact Dr Alanda Amass about you holding your coumadin

## 2011-04-26 NOTE — Assessment & Plan Note (Addendum)
Patient is symptomatic again. Plan repeat dilatation. Coumadin will be held if agreed upon by his cardiologist.

## 2011-04-26 NOTE — Assessment & Plan Note (Addendum)
I suspect his bleeding is from hemorrhoids. A more  proximal colonic bleeding source should be ruled out.  Recommendations #1 colonoscopy #2 Anusol a.c. suppositories #3 band ligation of hemorrhoids if he continues to bleed despite therapy and is felt to be due to hemorrhoids

## 2011-04-26 NOTE — Progress Notes (Signed)
History of Present Illness:  Benjamin Mckay has returned for evaluation of dysphagia and rectal bleeding. He has a history of an esophageal stricture and is now complaining of dysphagia to solids. He's also experiencing rectal bleeding consisting of bright red blood per rectum on the toilet tissue and mixed with the stools. There has been no other change in his bowel habits. He is status post partial colonic resection for colonic lymphoma.  The patient is on Coumadin for atrial fibrillation.    Review of Systems: Pertinent positive and negative review of systems were noted in the above HPI section. All other review of systems were otherwise negative.    Current Medications, Allergies, Past Medical History, Past Surgical History, Family History and Social History were reviewed in [employer] electronic medical record  Vital signs were reviewed in today's medical record. Physical Exam: General: Well developed , well nourished, no acute distress Head: Normocephalic and atraumatic Eyes:  sclerae anicteric, EOMI Ears: Normal auditory acuity Mouth: No deformity or lesions Lungs: Clear throughout to auscultation Heart: Regular rate and rhythm; no murmurs, rubs or bruits Abdomen: Soft, non tender and non distended. No masses, hepatosplenomegaly or hernias noted. Normal Bowel sounds Rectal: There are no external lesions Musculoskeletal: Symmetrical with no gross deformities  Pulses:  Normal pulses noted Extremities: No clubbing, cyanosis, edema or deformities noted Neurological: Alert oriented x 4, grossly nonfocal Psychological:  Alert and cooperative. Normal mood and affect

## 2011-05-08 ENCOUNTER — Telehealth: Payer: Self-pay | Admitting: *Deleted

## 2011-05-08 NOTE — Telephone Encounter (Signed)
RECEIVED DOCUMENTATION FROM DR Alanda Amass OK FOR PT TO HOLD COUMADIN   PT INFORMED

## 2011-05-08 NOTE — Telephone Encounter (Signed)
Message copied by Marlowe Kays on Wed May 08, 2011  2:23 PM ------      Message from: Marlowe Kays      Created: Fri Apr 26, 2011  9:44 AM      Regarding: coumadin       Procedure 8-1   Needs to come off coumadin waiting on Dr Alanda Amass

## 2011-05-08 NOTE — Telephone Encounter (Signed)
CALLED DR Mancel Parsons OFFICE TO EXPLAIN THAT WE HAVE NOT HAD A RESPONSE BACK ABOUT PTS COUMADIN..  NURSE TO CALL ME BACK

## 2011-05-15 ENCOUNTER — Ambulatory Visit (AMBULATORY_SURGERY_CENTER): Payer: Medicare Other | Admitting: Gastroenterology

## 2011-05-15 ENCOUNTER — Encounter: Payer: Self-pay | Admitting: Gastroenterology

## 2011-05-15 VITALS — BP 150/96 | HR 69 | Temp 97.3°F | Resp 18 | Ht 69.0 in | Wt 210.0 lb

## 2011-05-15 DIAGNOSIS — K2970 Gastritis, unspecified, without bleeding: Secondary | ICD-10-CM

## 2011-05-15 DIAGNOSIS — D126 Benign neoplasm of colon, unspecified: Secondary | ICD-10-CM

## 2011-05-15 DIAGNOSIS — K2940 Chronic atrophic gastritis without bleeding: Secondary | ICD-10-CM

## 2011-05-15 DIAGNOSIS — K222 Esophageal obstruction: Secondary | ICD-10-CM

## 2011-05-15 DIAGNOSIS — K625 Hemorrhage of anus and rectum: Secondary | ICD-10-CM

## 2011-05-15 MED ORDER — SODIUM CHLORIDE 0.9 % IV SOLN: 500.0000 mL | INTRAVENOUS | Status: DC

## 2011-05-15 MED ORDER — PANTOPRAZOLE SODIUM 40 MG PO TBEC: 40.0000 mg | DELAYED_RELEASE_TABLET | Freq: Every day | ORAL | Status: DC

## 2011-05-15 NOTE — Progress Notes (Signed)
Per the pt his last dose of coumadin was Friday.  Dr. Arlyce Dice made aware. MAW  Pt tolerated both colonoscopy and egd well. MAW

## 2011-05-15 NOTE — Patient Instructions (Addendum)
OV 4-6 weeks Discharge instructions given with verbal understanding. Handouts on polyps , diverticulosis, gastritis and a dilatation diet given. Resume Coumadin in the morning. Resume previous medications today.

## 2011-05-16 ENCOUNTER — Telehealth: Payer: Self-pay | Admitting: *Deleted

## 2011-05-16 NOTE — Telephone Encounter (Signed)
Called home number which was the number left in admitting yesterday and no identifiers so no message left. E Salma Walrond rn

## 2011-07-03 ENCOUNTER — Ambulatory Visit: Payer: Medicare Other | Admitting: Gastroenterology

## 2011-08-21 ENCOUNTER — Encounter: Payer: Self-pay | Admitting: Gastroenterology

## 2011-08-21 ENCOUNTER — Ambulatory Visit (INDEPENDENT_AMBULATORY_CARE_PROVIDER_SITE_OTHER): Payer: Medicare Other | Admitting: Gastroenterology

## 2011-08-21 DIAGNOSIS — K625 Hemorrhage of anus and rectum: Secondary | ICD-10-CM

## 2011-08-21 DIAGNOSIS — K222 Esophageal obstruction: Secondary | ICD-10-CM

## 2011-08-21 DIAGNOSIS — K429 Umbilical hernia without obstruction or gangrene: Secondary | ICD-10-CM

## 2011-08-21 DIAGNOSIS — K219 Gastro-esophageal reflux disease without esophagitis: Secondary | ICD-10-CM | POA: Insufficient documentation

## 2011-08-21 MED ORDER — HYDROCORTISONE ACETATE 25 MG RE SUPP: 25.0000 mg | Freq: Two times a day (BID) | RECTAL | Status: DC

## 2011-08-21 MED ORDER — PANTOPRAZOLE SODIUM 40 MG PO TBEC: 40.0000 mg | DELAYED_RELEASE_TABLET | Freq: Every day | ORAL | Status: DC

## 2011-08-21 NOTE — Assessment & Plan Note (Signed)
This is a due to rectal bleeding exacerbated by anticoagulation therapy.  Recommendations #1  Anusol suppositories

## 2011-08-21 NOTE — Patient Instructions (Signed)
We are sending in your prescription to your pharmacy 

## 2011-08-21 NOTE — Assessment & Plan Note (Signed)
He is symptomatic again off Protonix.  Recommendations #1 resume Protonix daily for at least 2 weeks and then attempt to decrease frequency

## 2011-08-21 NOTE — Assessment & Plan Note (Signed)
No further therapy at this time

## 2011-08-21 NOTE — Progress Notes (Signed)
Benjamin Mckay has returned following upper and lower endoscopy. Colonoscopy in August, 2012 demonstrated a diminutive non-adenomatous polyp and a normal anastomosis. An early esophageal stricture was dilated at endoscopy in August. Mild esophagitis was seen as well as gastritis.  He has been complaining of intermittent rectal bleeding. At one point he had almost daily bowel movements  mixed with blood. With suppositories bleeding has subsided. He felt well on Protonix. Since discontinuing this he developed some bloating and discomfort. He has some difficulty with swallowing with a sensation in his neck of fullness but not dysphagia, per se.  He remains on Coumadin. He is status post partial colonic resection for colonic lymphoma.  He also has a small umbilical hernia. It is not hurting him although he feels the area is tight postprandially.  On PE there is a 2-3cm reducible umbilical hernia

## 2011-08-21 NOTE — Assessment & Plan Note (Addendum)
Difficulties with swallowing maybe secondary to reflux. I doubt he has a recurrent stricture.  Recommendations #1 resume Protonix #2 if symptoms do not improve after at least 2 weeks of Protonix I will obtain a barium swallow

## 2011-10-01 ENCOUNTER — Ambulatory Visit (INDEPENDENT_AMBULATORY_CARE_PROVIDER_SITE_OTHER): Payer: Medicare Other

## 2011-10-01 DIAGNOSIS — J209 Acute bronchitis, unspecified: Secondary | ICD-10-CM

## 2011-10-06 ENCOUNTER — Ambulatory Visit (INDEPENDENT_AMBULATORY_CARE_PROVIDER_SITE_OTHER): Payer: Medicare Other

## 2011-10-06 DIAGNOSIS — J158 Pneumonia due to other specified bacteria: Secondary | ICD-10-CM

## 2011-10-28 ENCOUNTER — Other Ambulatory Visit: Payer: Self-pay | Admitting: Gastroenterology

## 2011-12-20 ENCOUNTER — Other Ambulatory Visit: Payer: Self-pay | Admitting: Gastroenterology

## 2012-01-21 ENCOUNTER — Encounter (HOSPITAL_COMMUNITY): Payer: Self-pay | Admitting: Pharmacy Technician

## 2012-01-30 ENCOUNTER — Other Ambulatory Visit: Payer: Self-pay | Admitting: Cardiovascular Disease

## 2012-01-30 MED ORDER — SODIUM CHLORIDE 0.9 % IR SOLN
80.0000 mg | Status: DC
  Filled 2012-01-30: qty 2

## 2012-01-30 MED ORDER — VANCOMYCIN HCL 1000 MG IV SOLR
1500.0000 mg | INTRAVENOUS | Status: DC
  Filled 2012-01-30: qty 1500

## 2012-01-31 ENCOUNTER — Ambulatory Visit (HOSPITAL_COMMUNITY)
Admission: RE | Admit: 2012-01-31 | Discharge: 2012-02-01 | Disposition: A | Discharge status: Home / Self Care | Payer: Medicare Other | Source: Ambulatory Visit | Attending: Cardiovascular Disease | Admitting: Cardiovascular Disease

## 2012-01-31 ENCOUNTER — Encounter (HOSPITAL_COMMUNITY)
Admission: RE | Disposition: A | Discharge status: Home / Self Care | Payer: Self-pay | Source: Ambulatory Visit | Attending: Cardiovascular Disease

## 2012-01-31 ENCOUNTER — Encounter (HOSPITAL_COMMUNITY): Payer: Self-pay | Admitting: *Deleted

## 2012-01-31 DIAGNOSIS — Z7901 Long term (current) use of anticoagulants: Secondary | ICD-10-CM | POA: Insufficient documentation

## 2012-01-31 DIAGNOSIS — K625 Hemorrhage of anus and rectum: Secondary | ICD-10-CM

## 2012-01-31 DIAGNOSIS — I442 Atrioventricular block, complete: Secondary | ICD-10-CM | POA: Insufficient documentation

## 2012-01-31 DIAGNOSIS — E785 Hyperlipidemia, unspecified: Secondary | ICD-10-CM | POA: Diagnosis present

## 2012-01-31 DIAGNOSIS — I10 Essential (primary) hypertension: Secondary | ICD-10-CM | POA: Diagnosis present

## 2012-01-31 DIAGNOSIS — I25119 Atherosclerotic heart disease of native coronary artery with unspecified angina pectoris: Secondary | ICD-10-CM | POA: Diagnosis present

## 2012-01-31 DIAGNOSIS — I701 Atherosclerosis of renal artery: Secondary | ICD-10-CM | POA: Diagnosis present

## 2012-01-31 DIAGNOSIS — I6529 Occlusion and stenosis of unspecified carotid artery: Secondary | ICD-10-CM | POA: Diagnosis present

## 2012-01-31 DIAGNOSIS — Z45018 Encounter for adjustment and management of other part of cardiac pacemaker: Secondary | ICD-10-CM

## 2012-01-31 DIAGNOSIS — I739 Peripheral vascular disease, unspecified: Secondary | ICD-10-CM | POA: Insufficient documentation

## 2012-01-31 DIAGNOSIS — Z87898 Personal history of other specified conditions: Secondary | ICD-10-CM | POA: Insufficient documentation

## 2012-01-31 DIAGNOSIS — Z23 Encounter for immunization: Secondary | ICD-10-CM | POA: Insufficient documentation

## 2012-01-31 DIAGNOSIS — I2510 Atherosclerotic heart disease of native coronary artery without angina pectoris: Secondary | ICD-10-CM | POA: Insufficient documentation

## 2012-01-31 HISTORY — PX: PERMANENT PACEMAKER GENERATOR CHANGE: SHX6022

## 2012-01-31 HISTORY — PX: PACEMAKER GENERATOR CHANGE: SHX5998

## 2012-01-31 LAB — SURGICAL PCR SCREEN
MRSA, PCR: NEGATIVE
Staphylococcus aureus: NEGATIVE

## 2012-01-31 LAB — BASIC METABOLIC PANEL
BUN: 20 mg/dL (ref 6–23)
Calcium: 9 mg/dL (ref 8.4–10.5)
Chloride: 103 mEq/L (ref 96–112)
Creatinine, Ser: 1.24 mg/dL (ref 0.50–1.35)
GFR calc Af Amer: 68 mL/min — ABNORMAL LOW (ref >90)
GFR calc non Af Amer: 59 mL/min — ABNORMAL LOW (ref >90)

## 2012-01-31 LAB — PROTIME-INR: Prothrombin Time: 15 seconds (ref 11.6–15.2)

## 2012-01-31 SURGERY — PERMANENT PACEMAKER GENERATOR CHANGE
Anesthesia: LOCAL

## 2012-01-31 MED ORDER — METOPROLOL TARTRATE 50 MG PO TABS
50.0000 mg | ORAL_TABLET | Freq: Two times a day (BID) | ORAL | Status: DC
  Administered 2012-01-31 – 2012-02-01 (×2): 50 mg via ORAL
  Filled 2012-01-31 (×3): qty 1

## 2012-01-31 MED ORDER — TAMSULOSIN HCL 0.4 MG PO CAPS
0.4000 mg | ORAL_CAPSULE | Freq: Every day | ORAL | Status: DC
  Administered 2012-01-31: 0.4 mg via ORAL
  Filled 2012-01-31 (×2): qty 1

## 2012-01-31 MED ORDER — MIDAZOLAM HCL 2 MG/2ML IJ SOLN
INTRAMUSCULAR | Status: AC
  Filled 2012-01-31: qty 2

## 2012-01-31 MED ORDER — FENTANYL CITRATE 0.05 MG/ML IJ SOLN
INTRAMUSCULAR | Status: AC
  Filled 2012-01-31: qty 2

## 2012-01-31 MED ORDER — ASPIRIN EC 81 MG PO TBEC
81.0000 mg | DELAYED_RELEASE_TABLET | Freq: Every day | ORAL | Status: DC
  Administered 2012-01-31 – 2012-02-01 (×2): 81 mg via ORAL
  Filled 2012-01-31 (×3): qty 1

## 2012-01-31 MED ORDER — SODIUM CHLORIDE 0.9 % IV SOLN
INTRAVENOUS | Status: DC
  Administered 2012-01-31: 08:00:00 via INTRAVENOUS

## 2012-01-31 MED ORDER — ONDANSETRON HCL 4 MG/2ML IJ SOLN
4.0000 mg | Freq: Four times a day (QID) | INTRAMUSCULAR | Status: DC | PRN
  Filled 2012-01-31: qty 2

## 2012-01-31 MED ORDER — TRIAMTERENE-HCTZ 37.5-25 MG PO TABS
1.0000 | ORAL_TABLET | ORAL | Status: DC
  Administered 2012-01-31: 1 via ORAL
  Filled 2012-01-31 (×2): qty 1

## 2012-01-31 MED ORDER — MUPIROCIN 2 % EX OINT
TOPICAL_OINTMENT | CUTANEOUS | Status: AC
  Administered 2012-01-31: 1
  Filled 2012-01-31: qty 22

## 2012-01-31 MED ORDER — VANCOMYCIN HCL IN DEXTROSE 1-5 GM/200ML-% IV SOLN
1000.0000 mg | Freq: Two times a day (BID) | INTRAVENOUS | Status: AC
  Administered 2012-01-31: 1000 mg via INTRAVENOUS
  Filled 2012-01-31 (×2): qty 200

## 2012-01-31 MED ORDER — LIDOCAINE HCL (PF) 1 % IJ SOLN
INTRAMUSCULAR | Status: AC
  Filled 2012-01-31: qty 60

## 2012-01-31 MED ORDER — SODIUM CHLORIDE 0.45 % IV SOLN: INTRAVENOUS | Status: DC

## 2012-01-31 MED ORDER — CHLORHEXIDINE GLUCONATE 4 % EX LIQD
60.0000 mL | Freq: Once | CUTANEOUS | Status: DC
  Administered 2012-01-31: 4 via TOPICAL

## 2012-01-31 MED ORDER — LOSARTAN POTASSIUM 50 MG PO TABS
100.0000 mg | ORAL_TABLET | Freq: Every day | ORAL | Status: DC
  Administered 2012-01-31 – 2012-02-01 (×2): 100 mg via ORAL
  Filled 2012-01-31 (×3): qty 2

## 2012-01-31 MED ORDER — SODIUM CHLORIDE 0.9 % IV SOLN: 500.0000 mL | INTRAVENOUS | Status: DC

## 2012-01-31 MED ORDER — PNEUMOCOCCAL VAC POLYVALENT 25 MCG/0.5ML IJ INJ
0.5000 mL | INJECTION | INTRAMUSCULAR | Status: AC
  Administered 2012-02-01: 0.5 mL via INTRAMUSCULAR
  Filled 2012-01-31: qty 0.5

## 2012-01-31 MED ORDER — PANTOPRAZOLE SODIUM 40 MG PO TBEC: 40.0000 mg | DELAYED_RELEASE_TABLET | Freq: Every day | ORAL | Status: DC | PRN

## 2012-01-31 MED ORDER — ACETAMINOPHEN 325 MG PO TABS
325.0000 mg | ORAL_TABLET | ORAL | Status: DC | PRN
Start: 2012-01-31 — End: 2012-02-01
  Administered 2012-01-31: 650 mg via ORAL
  Filled 2012-01-31: qty 2

## 2012-01-31 MED ORDER — ASPIRIN 81 MG PO TABS: 81.0000 mg | ORAL_TABLET | Freq: Every day | ORAL | Status: DC

## 2012-01-31 MED ORDER — ZOLPIDEM TARTRATE 5 MG PO TABS
5.0000 mg | ORAL_TABLET | Freq: Every day | ORAL | Status: DC
  Administered 2012-01-31: 5 mg via ORAL
  Filled 2012-01-31: qty 1

## 2012-01-31 NOTE — H&P (Signed)
Date of Initial H&P: 01/21/2012,Dr. Alanda Amass History reviewed, patient examined, no change in status, stable for surgery. Thurmon Fair, MD, [facility] [phone] office [phone] pager 01/31/2012 8:43 AM

## 2012-01-31 NOTE — CV Procedure (Signed)
Procedure report  Procedure performed:  1. Implantation of new single chamber permanent pacemaker 2. Fluoroscopy 3. Light sedation 4. Left upper extremity venogram 5. Removal of old pacemaker generator and capping of chronic ventricular lead 6. Pocket revision    Reason for procedure: Complete heart block Permanent atrial fibrillation Pacemaker at elective replacement interval Chronic unipolar ventricular lead, upgrade to bipolar device  Procedure performed by: Thurmon Fair, MD  Complications: None  Estimated blood loss: <10 mL  Medications administered during procedure: Vancomycin 1 g intravenously Lidocaine 1% 30 mL locally,  Fentanyl 7 mcg intravenously Versed 175 mg intravenously  Device details: Orthoptist DR RF model PM 2210 serial number [id number] Right ventricular lead [employer] 2088 TC-58 cm serial number [id number] Chronic generator was explanted [address]. Jude [place] model [id number] serial [id number] implanted 01/05/2003)  Procedure details:  After the risks and benefits of the procedure were discussed the patient provided informed consent and was brought to the cardiac cath lab in the fasting state. The patient was prepped and draped in usual sterile fashion. Local anesthesia with 1% lidocaine was administered to to the left infraclavicular area. A 5-6 cm horizontal incision was made parallel with and 2-3 cm caudal to the left clavicle, immediately cranial to 2 old scars. Using minimal electrocautery and blunt dissection the prepectoral pocket was opened down to the level of the pectoralis major muscle fascia. The chronic pacemaker capsule was opened. The capsule was a very thick heavily adherent and would not yield to easy pressure. During dissection of the capsule there was repeated inhibition of pacing via the unipolar device.   A. Left upper extremity venogram was then performed to make sure that there was room for an additional lead.  The subclavian vein appeared widely patent. It was accessed without great difficulty and a J-tip guidewire was placed all the way to the right atrium. This was then exchanged for a 7 [place] safe sheath. A new bipolar transvenous pacing lead was placed under fluoroscopic guidance at the level of the right ventricular apical septum. Excellent pacing parameters were seen. The paced beats from the chronic generator were well sensed. There was no underlying rhythm when the pacemaker was turned off. The sheath was peeled away and the new lead was secured in place using 2-0 silk.  Prominent current of injury was seen. Satisfactory pacing and sensing parameters were recorded. There was no evidence of diaphragmatic stimulation at maximum device output. It was then connected to the new pacemaker generator which thereby provided backup pacing during dissection of the pocket.  The chronic generator was then dissected away from the pocket. Extensive revision of the pocket was necessary to accommodate a device that had a very different shape.  The pocket was flushed with copious amounts of antibiotic solution. Inspection showed excellent hemostasis. The chronic lead was then capped.  Repeat testing of the lead parameters via telemetry showed excellent values.  The entire system was then carefully inserted in the pocket with care been taking that the leads and device assumed a comfortable position without pressure on the incision. Great care was taken that the leads be located deep to the generator. The pocket was then closed in layers using 2 layers of 2-0 Vicryl and cutaneous staples, after which a sterile dressing was applied.  At the end of the procedure the following lead parameters were encountered:  Right ventricular lead sensed R waves 8.4-9.4 mV, impedance 737ohms, threshold 0.7 V at 0.5 ms pulse width.  current 0.7 milliamps.  Thurmon Fair, MD, [facility] and [facility] [phone]  office [phone] pager 01/31/2012 10:55 AM

## 2012-02-01 ENCOUNTER — Ambulatory Visit (HOSPITAL_COMMUNITY): Payer: Medicare Other

## 2012-02-01 DIAGNOSIS — Z45018 Encounter for adjustment and management of other part of cardiac pacemaker: Secondary | ICD-10-CM

## 2012-02-01 DIAGNOSIS — I701 Atherosclerosis of renal artery: Secondary | ICD-10-CM | POA: Diagnosis present

## 2012-02-01 DIAGNOSIS — E785 Hyperlipidemia, unspecified: Secondary | ICD-10-CM | POA: Diagnosis present

## 2012-02-01 DIAGNOSIS — I6529 Occlusion and stenosis of unspecified carotid artery: Secondary | ICD-10-CM | POA: Diagnosis present

## 2012-02-01 DIAGNOSIS — I10 Essential (primary) hypertension: Secondary | ICD-10-CM | POA: Diagnosis present

## 2012-02-01 DIAGNOSIS — I25119 Atherosclerotic heart disease of native coronary artery with unspecified angina pectoris: Secondary | ICD-10-CM | POA: Diagnosis present

## 2012-02-01 MED ORDER — WARFARIN SODIUM 5 MG PO TABS: 5.0000 mg | ORAL_TABLET | Freq: Every day | ORAL | Status: DC

## 2012-02-01 NOTE — Discharge Summary (Signed)
Physician Discharge Summary  Patient ID: Benjamin Mckay MRN: [id number] DOB/AGE: 1945-10-19 66 y.o.  Admit date: 01/31/2012 Discharge date: 02/01/2012  Admission Diagnoses: Pacemaker EOL  Discharge Diagnoses:  Principal Problem:  *Pacemaker end of life Active Problems:  HLD  HTN (hypertension)  PAD (peripheral artery disease): Carotid  Renal artery stenosis  CAD (coronary artery disease)   Discharged Condition: stable  Hospital Course:  The patient is a 66 year old Caucasian male with a history of hypertension, atrial fibrillation(takes Coumadin), hyperlipidemia, non-Hodgkin's lymphoma with remote abdominal surgery, asymptomatic carotid bruits, increased iliac velocities with right ABI of 1.10 left ABI 1.15 June 2011. He also has renal artery stenosis as well as coronary disease. He had a pacemaker implanted initially in 1984 by Dr. Alanda Amass to upgraded generator in 1995 and end-of-life generator change in 2004.  The patient presented for generator change to a single-chamber permanent pacemaker(see detailed report below). This was completed without complications. Chest x-ray shows no evidence of pneumothorax but does show fractured lead which is presumably the abandoned atrial lead.  The patient was seen by Dr. Tresa Endo felt stable for discharge home. Patient will restart Coumadin on Sunday, 02/02/2012. The patient will followup at the Coumadin clinic for INR checks and with Dr. Royann Shivers for wound site check.  Consults: None  Significant Diagnostic Studies:  CHEST - 2 VIEW  Comparison: Barium swallow - 06/25/2007  Findings:  Borderline enlarged cardiac silhouette. Normal mediastinal  contours. Left anterior chest wall pacemaker with lead tip  projecting over the expected location of the right atrium and  ventricle. There is a fractured lead seen over the superior aspect  of left lung. No pneumothorax or pleural effusion. No definite  evidence of pulmonary edema. Minimal bibasilar  atelectasis. No  acute osseous abnormalities.  IMPRESSION:  Left anterior chest wall pacemaker insertion/revision. Fractured  lead tip is seen over the left lung apex, presumably an abandoned  pacer lead. Clinical correlation is advised.   Treatments:  Procedure performed:  1. Implantation of new single chamber permanent pacemaker  2. Fluoroscopy  3. Light sedation  4. Left upper extremity venogram  5. Removal of old pacemaker generator and capping of chronic ventricular lead  6. Pocket revision  Reason for procedure:  Complete heart block  Permanent atrial fibrillation  Pacemaker at elective replacement interval  Chronic unipolar ventricular lead, upgrade to bipolar device  Procedure performed by:  Thurmon Fair, MD  Complications:  None  Estimated blood loss:  <10 mL  Medications administered during procedure:  Vancomycin 1 g intravenously  Lidocaine 1% 30 mL locally,  Fentanyl 7 mcg intravenously  Versed 175 mg intravenously  Device details:  Orthoptist DR RF model PM 2210 serial number [id number]  Right ventricular lead [employer] 2088 TC-58 cm serial number [id number]  Chronic generator was explanted [address]. Jude [place] model [id number] serial [id number] implanted 01/05/2003)  Procedure details:  After the risks and benefits of the procedure were discussed the patient provided informed consent and was brought to the cardiac cath lab in the fasting state. The patient was prepped and draped in usual sterile fashion. Local anesthesia with 1% lidocaine was administered to to the left infraclavicular area. A 5-6 cm horizontal incision was made parallel with and 2-3 cm caudal to the left clavicle, immediately cranial to 2 old scars. Using minimal electrocautery and blunt dissection the prepectoral pocket was opened down to the level of the pectoralis major muscle fascia. The chronic pacemaker capsule was opened. The  capsule was a very thick heavily adherent and  would not yield to easy pressure. During dissection of the capsule there was repeated inhibition of pacing via the unipolar device.  A. Left upper extremity venogram was then performed to make sure that there was room for an additional lead. The subclavian vein appeared widely patent. It was accessed without great difficulty and a J-tip guidewire was placed all the way to the right atrium. This was then exchanged for a 7 [place] safe sheath. A new bipolar transvenous pacing lead was placed under fluoroscopic guidance at the level of the right ventricular apical septum. Excellent pacing parameters were seen. The paced beats from the chronic generator were well sensed. There was no underlying rhythm when the pacemaker was turned off. The sheath was peeled away and the new lead was secured in place using 2-0 silk. Prominent current of injury was seen. Satisfactory pacing and sensing parameters were recorded. There was no evidence of diaphragmatic stimulation at maximum device output. It was then connected to the new pacemaker generator which thereby provided backup pacing during dissection of the pocket.  The chronic generator was then dissected away from the pocket. Extensive revision of the pocket was necessary to accommodate a device that had a very different shape.  The pocket was flushed with copious amounts of antibiotic solution. Inspection showed excellent hemostasis. The chronic lead was then capped.  Repeat testing of the lead parameters via telemetry showed excellent values.  The entire system was then carefully inserted in the pocket with care been taking that the leads and device assumed a comfortable position without pressure on the incision. Great care was taken that the leads be located deep to the generator. The pocket was then closed in layers using 2 layers of 2-0 Vicryl and cutaneous staples, after which a sterile dressing was applied.  At the end of the procedure the following lead parameters  were encountered:  Right ventricular lead sensed R waves 8.4-9.4 mV, impedance 737ohms, threshold 0.7 V at 0.5 ms pulse width. current 0.7 milliamps.  Thurmon Fair, MD, [facility]  [facility] and [facility]  [phone] office  [phone] pager  01/31/2012  10:55 AM  Discharge Exam: Blood pressure 106/60, pulse 69, temperature 97.8 F (36.6 C), temperature source Oral, resp. rate 18, height 5\' 9"  (1.753 m), weight 98 kg (216 lb 0.8 oz), SpO2 98.00%.   Disposition:   Discharge Orders    Future Orders Please Complete By Expires   Diet - low sodium heart healthy      Increase activity slowly      Call MD for:  redness, tenderness, or signs of infection (pain, swelling, redness, odor or green/yellow discharge around incision site)        Medication List  As of 02/01/2012 10:45 AM   TAKE these medications         aspirin 81 MG tablet   Take 81 mg by mouth daily.      FLOMAX 0.4 MG Caps   Generic drug: Tamsulosin HCl   Take by mouth.      losartan 100 MG tablet   Commonly known as: COZAAR   Take 100 mg by mouth daily.      metoprolol 50 MG tablet   Commonly known as: LOPRESSOR   Take 50 mg by mouth 2 (two) times daily.      pantoprazole 40 MG tablet   Commonly known as: PROTONIX   Take 40 mg by mouth daily as needed. Acid reflux  triamterene-hydrochlorothiazide 37.5-25 MG per tablet   Commonly known as: MAXZIDE-25   Take 1 tablet by mouth See admin instructions. Pt takes Monday through Friday.      warfarin 5 MG tablet   Commonly known as: COUMADIN   Take 1-2.5 tablets (5-12.5 mg total) by mouth daily. Pt takes 1 tab on tue,wed,thu,sat,and sun. And takes 2 & 1/2 tabs on Mon, and Fri           Follow-up Information    Follow up with Thurmon Fair, MD on 02/06/2012. (for coumadin clinic  and follow up for pacer site check with Dr. Salena Saner )    Contact information:   [address] [place] [address] [phone]        Follow up with Thurmon Fair, MD.   Contact information:   [address] [place] [address] [phone]          Signed: Dwana Melena 02/01/2012, 10:45 AM

## 2012-02-01 NOTE — Discharge Instructions (Signed)

## 2012-02-01 NOTE — Progress Notes (Signed)
[facility]  Subjective: No complaints.  Objective: Vital signs in last 24 hours: Temp:  [96.9 F (36.1 C)-98.6 F (37 C)] 97.8 F (36.6 C) (04/20 0718) Pulse Rate:  [62-71] 69  (04/20 0321) Resp:  [11-18] 18  (04/20 0321) BP: (101-138)/(58-80) 101/58 mmHg (04/20 0321) SpO2:  [94 %-100 %] 98 % (04/20 0718) Weight:  [98 kg (216 lb 0.8 oz)-99.791 kg (220 lb)] 98 kg (216 lb 0.8 oz) (04/19 1357) Last BM Date: 01/31/12  Intake/Output from previous day: 04/19 0701 - 04/20 0700 In: 1040 [P.O.:840; IV Piggyback:200] Out: 2825 [Urine:2825] Intake/Output this shift:    Medications Current Facility-Administered Medications  Medication Dose Route Frequency Provider Last Rate Last Dose  . 0.9 %  sodium chloride infusion  500 mL Intravenous Continuous Mihai Croitoru, MD      . acetaminophen (TYLENOL) tablet 325-650 mg  325-650 mg Oral Q4H PRN Thurmon Fair, MD   650 mg at 01/31/12 2221  . aspirin EC tablet 81 mg  81 mg Oral Daily Mihai Croitoru, MD   81 mg at 01/31/12 1544  . fentaNYL (SUBLIMAZE) 0.05 MG/ML injection           . fentaNYL (SUBLIMAZE) 0.05 MG/ML injection           . lidocaine (XYLOCAINE) 1 % injection           . losartan (COZAAR) tablet 100 mg  100 mg Oral Daily Mihai Croitoru, MD   100 mg at 01/31/12 1543  . metoprolol (LOPRESSOR) tablet 50 mg  50 mg Oral BID Thurmon Fair, MD   50 mg at 01/31/12 2105  . midazolam (VERSED) 2 MG/2ML injection           . midazolam (VERSED) 2 MG/2ML injection           . midazolam (VERSED) 2 MG/2ML injection           . mupirocin ointment (BACTROBAN) 2 %        1 application at 01/31/12 0800  . ondansetron (ZOFRAN) injection 4 mg  4 mg Intravenous Q6H PRN Mihai Croitoru, MD      . pantoprazole (PROTONIX) EC tablet 40 mg  40 mg Oral Daily PRN Mihai Croitoru, MD      . pneumococcal 23 valent vaccine (PNU-IMMUNE) injection 0.5 mL  0.5 mL Intramuscular Tomorrow-1000 Mihai Croitoru, MD      . Tamsulosin HCl  (FLOMAX) capsule 0.4 mg  0.4 mg Oral QPC supper Mihai Croitoru, MD   0.4 mg at 01/31/12 1732  . triamterene-hydrochlorothiazide (MAXZIDE-25) 37.5-25 MG per tablet 1 each  1 each Oral Custom Thurmon Fair, MD   1 each at 01/31/12 1544  . vancomycin (VANCOCIN) IVPB 1000 mg/200 mL premix  1,000 mg Intravenous Q12H Mihai Croitoru, MD   1,000 mg at 01/31/12 1545  . zolpidem (AMBIEN) tablet 5 mg  5 mg Oral QHS Mihai Croitoru, MD   5 mg at 01/31/12 2221  . DISCONTD: 0.45 % sodium chloride infusion   Intravenous Continuous Mihai Croitoru, MD      . DISCONTD: 0.9 %  sodium chloride infusion   Intravenous Continuous Mihai Croitoru, MD 50 mL/hr at 01/31/12 0759    . DISCONTD: aspirin tablet 81 mg  81 mg Oral Daily Mihai Croitoru, MD      . DISCONTD: chlorhexidine (HIBICLENS) 4 % liquid 4 application  60 mL Topical Once Thurmon Fair, MD   4 application at 01/31/12 0730  . DISCONTD: gentamicin (GARAMYCIN) 80 mg  in sodium chloride irrigation 0.9 % 500 mL irrigation  80 mg Irrigation On Call Thurmon Fair, MD      . DISCONTD: vancomycin (VANCOCIN) 1,500 mg in sodium chloride 0.9 % 500 mL IVPB  1,500 mg Intravenous On Call Thurmon Fair, MD        PE: General appearance: alert, cooperative and no distress Lungs: clear to auscultation bilaterally Heart: regular rate and rhythm Extremities: No LEE Pulses: 2+ and symmetric Pacer site: Sore to touch. Drainage appears stopped.  BMET  Basename 01/31/12 0744  NA 135  K 4.2  CL 103  CO2 24  GLUCOSE 108*  BUN 20  CREATININE 1.24  CALCIUM 9.0   PT/INR  Basename 01/31/12 0744  LABPROT 15.0  INR 1.16   Cholesterol No results found for this basename: CHOL in the last 72 hours Cardiac Enzymes No components found with this basename: TROPONIN:3, CKMB:3  Studies/Results: Procedure performed:  1. Implantation of new single chamber permanent pacemaker  2. Fluoroscopy  3. Light sedation  4. Left upper extremity venogram  5. Removal of old pacemaker  generator and capping of chronic ventricular lead  6. Pocket revision  Reason for procedure:  Complete heart block  Permanent atrial fibrillation  Pacemaker at elective replacement interval  Chronic unipolar ventricular lead, upgrade to bipolar device  Procedure performed by:  Thurmon Fair, MD  Complications:  None  Estimated blood loss:  <10 mL  Medications administered during procedure:  Vancomycin 1 g intravenously  Lidocaine 1% 30 mL locally,  Fentanyl 7 mcg intravenously  Versed 175 mg intravenously  Device details:  Orthoptist DR RF model PM 2210 serial number [id number]  Right ventricular lead [employer] 2088 TC-58 cm serial number [id number]  Chronic generator was explanted [address] Jude [place] model [id number] serial [id number] implanted 01/05/2003)  Procedure details:  After the risks and benefits of the procedure were discussed the patient provided informed consent and was brought to the cardiac cath lab in the fasting state. The patient was prepped and draped in usual sterile fashion. Local anesthesia with 1% lidocaine was administered to to the left infraclavicular area. A 5-6 cm horizontal incision was made parallel with and 2-3 cm caudal to the left clavicle, immediately cranial to 2 old scars. Using minimal electrocautery and blunt dissection the prepectoral pocket was opened down to the level of the pectoralis major muscle fascia. The chronic pacemaker capsule was opened. The capsule was a very thick heavily adherent and would not yield to easy pressure. During dissection of the capsule there was repeated inhibition of pacing via the unipolar device.  A. Left upper extremity venogram was then performed to make sure that there was room for an additional lead. The subclavian vein appeared widely patent. It was accessed without great difficulty and a J-tip guidewire was placed all the way to the right atrium. This was then exchanged for a 7 [place] safe  sheath. A new bipolar transvenous pacing lead was placed under fluoroscopic guidance at the level of the right ventricular apical septum. Excellent pacing parameters were seen. The paced beats from the chronic generator were well sensed. There was no underlying rhythm when the pacemaker was turned off. The sheath was peeled away and the new lead was secured in place using 2-0 silk. Prominent current of injury was seen. Satisfactory pacing and sensing parameters were recorded. There was no evidence of diaphragmatic stimulation at maximum device output. It was then connected to the new pacemaker generator  which thereby provided backup pacing during dissection of the pocket.  The chronic generator was then dissected away from the pocket. Extensive revision of the pocket was necessary to accommodate a device that had a very different shape.  The pocket was flushed with copious amounts of antibiotic solution. Inspection showed excellent hemostasis. The chronic lead was then capped.  Repeat testing of the lead parameters via telemetry showed excellent values.  The entire system was then carefully inserted in the pocket with care been taking that the leads and device assumed a comfortable position without pressure on the incision. Great care was taken that the leads be located deep to the generator. The pocket was then closed in layers using 2 layers of 2-0 Vicryl and cutaneous staples, after which a sterile dressing was applied.  At the end of the procedure the following lead parameters were encountered:  Right ventricular lead sensed R waves 8.4-9.4 mV, impedance 737ohms, threshold 0.7 V at 0.5 ms pulse width. current 0.7 milliamps.  Thurmon Fair, MD, [facility]  [facility] and V[facility] Center  [phone] office  [phone] pager  01/31/2012  10:55 AM   CXR:  Pending  Assessment/Plan  Principal Problem:  *Pacemaker end of life Active Problems:  HLD  HTN (hypertension)  PAD (peripheral  artery disease): Carotid  Renal artery stenosis  CAD (coronary artery disease) AFIB  Plan:  S/P single chamber PPM generator change.  Doing well.  CXR read pending.  If ok, DC home today.  Need to restart coumadin 48hrs after procedure.   LOS: 1 day    HAGER,BRYAN W 02/01/2012 7:49 AM   Patient seen and examined. Agree with assessment and plan. No chest pain or SOB. Pacer site stable without hematoma.  Will DC today. Resume home dose coumadin tomorrow.   Lennette Bihari, MD, [facility] 02/01/2012 8:31 AM

## 2012-02-04 ENCOUNTER — Ambulatory Visit
Admission: RE | Admit: 2012-02-04 | Discharge: 2012-02-04 | Disposition: A | Discharge status: Home / Self Care | Payer: Medicare Other | Source: Ambulatory Visit | Attending: Urology | Admitting: Urology

## 2012-02-04 ENCOUNTER — Other Ambulatory Visit: Payer: Self-pay | Admitting: Urology

## 2012-02-04 DIAGNOSIS — N509 Disorder of male genital organs, unspecified: Secondary | ICD-10-CM

## 2012-02-07 ENCOUNTER — Ambulatory Visit: Payer: Medicare Other

## 2012-02-07 ENCOUNTER — Ambulatory Visit (INDEPENDENT_AMBULATORY_CARE_PROVIDER_SITE_OTHER): Payer: Medicare Other | Admitting: Family Medicine

## 2012-02-07 VITALS — BP 98/61 | HR 70 | Temp 98.1°F | Resp 18 | Ht 68.0 in | Wt 218.4 lb

## 2012-02-07 DIAGNOSIS — R0609 Other forms of dyspnea: Secondary | ICD-10-CM

## 2012-02-07 DIAGNOSIS — J988 Other specified respiratory disorders: Secondary | ICD-10-CM

## 2012-02-07 DIAGNOSIS — R0989 Other specified symptoms and signs involving the circulatory and respiratory systems: Secondary | ICD-10-CM

## 2012-02-07 DIAGNOSIS — R059 Cough, unspecified: Secondary | ICD-10-CM

## 2012-02-07 DIAGNOSIS — R05 Cough: Secondary | ICD-10-CM

## 2012-02-07 MED ORDER — HYDROCOD POLST-CHLORPHEN POLST 10-8 MG/5ML PO LQCR: 5.0000 mL | Freq: Two times a day (BID) | ORAL | Status: DC | PRN

## 2012-02-07 MED ORDER — LEVOFLOXACIN 500 MG PO TABS: 500.0000 mg | ORAL_TABLET | Freq: Every day | ORAL | Status: AC

## 2012-02-07 NOTE — Progress Notes (Signed)
[facility]:  Office Visit  Chief Complaint:  Chief Complaint  Patient presents with  . Cough    x 1 week    HPI: Benjamin Mckay is a 66 y.o. male who complains of  Cough x 10 days , productive sputum, thick yellow cough. Was in the hospital 1 week ago for pacemaker replacement. Had cough prior to this. He denies fevers, chills, ear pain, sinus pressure, CP. HAs some SOB and wheezing. Denies allergies and/or asthma.   Past Medical History  Diagnosis Date  . Atrial fibrillation   . Lymphoma 1998    of colon   . Diverticulosis   . Esophageal stricture   . Hyperlipidemia   . Hypertension   . CVA (cerebral infarction)   . Colon polyps   . Urinary frequency   . Umbilical hernia    Past Surgical History  Procedure Date  . Appendectomy 1953  . Colectomy     for lymphoma  . Pacemaker placement     replaced 3 x  . Tonsillectomy   . Neck fusion   . Back surgery    History   Social History  . Marital Status: Divorced    Spouse Name: N/A    Number of Children: N/A  . Years of Education: N/A   Social History Main Topics  . Smoking status: Never Smoker   . Smokeless tobacco: None  . Alcohol Use: No  . Drug Use: No  . Sexually Active: None   Other Topics Concern  . None   Social History Narrative  . None   Family History  Problem Relation Age of Onset  . Prostate cancer Father   . Colon cancer Neg Hx   . Diabetes Maternal Grandmother   . Heart disease Mother    Allergies  Allergen Reactions  . Penicillins Rash   Prior to Admission medications   Medication Sig Start Date End Date Taking? Authorizing Provider  aspirin 81 MG tablet Take 81 mg by mouth daily.     Yes Historical Provider, MD  losartan (COZAAR) 100 MG tablet Take 100 mg by mouth daily.     Yes Historical Provider, MD  Melatonin 5 MG CAPS Take 5 mg by mouth at bedtime.   Yes Historical Provider, MD  metoprolol (LOPRESSOR) 50 MG tablet Take 50 mg by mouth 2 (two) times daily.   04/21/11  Yes Historical Provider, MD  pantoprazole (PROTONIX) 40 MG tablet Take 40 mg by mouth daily as needed. Acid reflux   Yes Historical Provider, MD  Tamsulosin HCl (FLOMAX) 0.4 MG CAPS Take by mouth.     Yes Historical Provider, MD  warfarin (COUMADIN) 5 MG tablet Take 1-2.5 tablets (5-12.5 mg total) by mouth daily. Pt takes 1 tab on tue,wed,thu,sat,and sun. And takes 2 & 1/2 tabs on Mon, and Fri 02/01/12  Yes Dwana Melena, PA  triamterene-hydrochlorothiazide (MAXZIDE-25) 37.5-25 MG per tablet Take 1 tablet by mouth See admin instructions. Pt takes Monday through Friday.    Historical Provider, MD     ROS: The patient denies fevers, chills, night sweats, unintentional weight loss, chest pain, palpitations, nausea, vomiting, abdominal pain, dysuria, hematuria, melena, numbness, weakness, or tingling. +SOB, wheeze  All other systems have been reviewed and were otherwise negative with the exception of those mentioned in the HPI and as above.    PHYSICAL EXAM: Filed Vitals:   02/07/12 1400  BP: 98/61  Pulse: 70  Temp: 98.1 F (36.7 C)  Resp: 18  Filed Vitals:   02/07/12 1400  Height: 5\' 8"  (1.727 m)  Weight: 218 lb 6.4 oz (99.066 kg)   Body mass index is 33.21 kg/(m^2).  General: Alert, no acute distress HEENT:  Normocephalic, atraumatic, oropharynx patent. Tm nl, no exudates. noggy red nares. PERRLA, EOMI.  Cardiovascular:  Regular rate and rhythm, no rubs murmurs or gallops.  No Carotid bruits, radial pulse intact. No pedal edema.  Respiratory: Clear to auscultation bilaterally.  No wheezes, rales, or rhonchi.  No cyanosis, no use of accessory musculature. Decrease BS on right lower lobe GI: No organomegaly, abdomen is soft and non-tender, positive bowel sounds.  No masses. Skin: No rashes. Neurologic: Facial musculature symmetric. Psychiatric: Patient is appropriate throughout our interaction. Lymphatic: No cervical lymphadenopathy Musculoskeletal: Gait  intact.   LABS: Results for orders placed during the hospital encounter of 01/31/12  PROTIME-INR      Component Value Range   Prothrombin Time 15.0  11.6 - 15.2 (seconds)   INR 1.16  0.00 - 1.49   BASIC METABOLIC PANEL      Component Value Range   Sodium 135  135 - 145 (mEq/L)   Potassium 4.2  3.5 - 5.1 (mEq/L)   Chloride 103  96 - 112 (mEq/L)   CO2 24  19 - 32 (mEq/L)   Glucose, Bld 108 (*) 70 - 99 (mg/dL)   BUN 20  6 - 23 (mg/dL)   Creatinine, Ser 4.09  0.50 - 1.35 (mg/dL)   Calcium 9.0  8.4 - 81.1 (mg/dL)   GFR calc non Af Amer 59 (*) >90 (mL/min)   GFR calc Af Amer 68 (*) >90 (mL/min)  SURGICAL PCR SCREEN      Component Value Range   MRSA, PCR NEGATIVE  NEGATIVE    Staphylococcus aureus NEGATIVE  NEGATIVE      EKG/XRAY:   Primary read interpreted by Dr. Conley Rolls at [facility]. COmpared to 02/04/12 xray ? Right lower lobe infiltrate vs increase vascular markings. MOre circumscribed than 4/23 xr.    ASSESSMENT/PLAN: Encounter Diagnoses  Name Primary?  . Cough Yes  . Respiratory infection    Rx Tussionex Respiratory Infection?-Right lower lobe infiltrate on Xray today compared to 02/04/12.  Rx Levaquin 500 mg daily x 10 days F/u prn    Saher Davee PHUONG, DO 02/07/2012 3:12 PM

## 2012-02-08 ENCOUNTER — Emergency Department: Payer: Self-pay | Admitting: Internal Medicine

## 2012-02-08 LAB — COMPREHENSIVE METABOLIC PANEL
Alkaline Phosphatase: 96 U/L (ref 50–136)
Anion Gap: 11 (ref 7–16)
BUN: 24 mg/dL — ABNORMAL HIGH (ref 7–18)
Calcium, Total: 8.4 mg/dL — ABNORMAL LOW (ref 8.5–10.1)
Chloride: 102 mmol/L (ref 98–107)
Co2: 24 mmol/L (ref 21–32)
Creatinine: 1.9 mg/dL — ABNORMAL HIGH (ref 0.60–1.30)
Potassium: 3.7 mmol/L (ref 3.5–5.1)
SGOT(AST): 19 U/L (ref 15–37)

## 2012-02-08 LAB — CBC
HGB: 15.1 g/dL (ref 13.0–18.0)
MCV: 95 fL (ref 80–100)
RBC: 4.72 10*6/uL (ref 4.40–5.90)
RDW: 14.1 % (ref 11.5–14.5)

## 2012-03-30 ENCOUNTER — Other Ambulatory Visit: Payer: Self-pay | Admitting: Gastroenterology

## 2012-06-23 ENCOUNTER — Other Ambulatory Visit: Payer: Self-pay | Admitting: Gastroenterology

## 2012-07-16 HISTORY — PX: CARDIOVASCULAR STRESS TEST: SHX262

## 2012-09-08 ENCOUNTER — Encounter: Payer: Self-pay | Admitting: Gastroenterology

## 2012-09-16 ENCOUNTER — Ambulatory Visit (INDEPENDENT_AMBULATORY_CARE_PROVIDER_SITE_OTHER): Payer: Medicare Other | Admitting: Gastroenterology

## 2012-09-16 ENCOUNTER — Encounter: Payer: Self-pay | Admitting: Gastroenterology

## 2012-09-16 VITALS — BP 116/84 | HR 92 | Ht 67.5 in | Wt 220.2 lb

## 2012-09-16 DIAGNOSIS — K3189 Other diseases of stomach and duodenum: Secondary | ICD-10-CM | POA: Insufficient documentation

## 2012-09-16 DIAGNOSIS — K219 Gastro-esophageal reflux disease without esophagitis: Secondary | ICD-10-CM

## 2012-09-16 DIAGNOSIS — R142 Eructation: Secondary | ICD-10-CM

## 2012-09-16 DIAGNOSIS — R1013 Epigastric pain: Secondary | ICD-10-CM

## 2012-09-16 DIAGNOSIS — R140 Abdominal distension (gaseous): Secondary | ICD-10-CM

## 2012-09-16 NOTE — Patient Instructions (Addendum)
You have been scheduled for a gastric emptying scan at [facility] on 10/01/2012 at 12:30pm. Please arrive at least 15 minutes prior to your appointment for registration. Please make certain not to have anything to eat or drink after midnight the night before your test. Hold all stomach medications (ex: Zofran, phenergan, Reglan) 48 hours prior to your test. If you need to reschedule your appointment, please contact radiology scheduling at [phone]. _____________________________________________________________________ A gastric-emptying study measures how long it takes for food to move through your stomach. There are several ways to measure stomach emptying. In the most common test, you eat food that contains a small amount of radioactive material. A scanner that detects the movement of the radioactive material is placed over your abdomen to monitor the rate at which food leaves your stomach. This test normally takes about 2 hours to complete. _____________________________________________________________________

## 2012-09-16 NOTE — Assessment & Plan Note (Addendum)
Symptoms of postprandial fullness bloating and regurgitation could be do to gastroparesis. Ulcer or nonulcer dyspepsia are possibilities. Symptoms could be due to severe acid reflux although he denies pyrosis.  Occult intra-abdominal malignancy is much less likely, though must be considered in view of the patient's history of colonic lymphoma.  Recommendations #1 gastric emptying scan #2 upper endoscopy #1 is negative #3 to consider CT of the abdomen and pelvis pending results of above

## 2012-09-16 NOTE — Progress Notes (Signed)
History of Present Illness: Benjamin Mckay has returned for evaluation of dyspepsia. Soon after eating he has an intense sense of fullness. He developed excess belching. He rarely vomits. When he has he gets some relief.  He feels there is tightness in his abdomen. He denies pyrosis, per se but has excess belching. He feels a fullness in the neck and sometimes has difficulty initiating a swallow. He denies dysphagia, per se.  Colonoscopy in 2012 demonstrated a diminutive non-neoplastic polyp. A distal esophageal stricture was dilated in 2012. Weight has been stable. There has been no change in his bowel habits.    Past Medical History  Diagnosis Date  . Atrial fibrillation   . Lymphoma 1998    of colon   . Diverticulosis   . Esophageal stricture   . Hyperlipidemia   . Hypertension   . CVA (cerebral infarction)   . Colon polyps   . Urinary frequency   . Umbilical hernia    Past Surgical History  Procedure Date  . Appendectomy 1953  . Colectomy     for lymphoma  . Pacemaker placement     replaced 3 x  . Tonsillectomy   . Neck fusion   . Back surgery    family history includes Diabetes in his maternal grandmother; Heart disease in his mother; and Prostate cancer in his father.  There is no history of Colon cancer. Current Outpatient Prescriptions  Medication Sig Dispense Refill  . aspirin 81 MG tablet Take 81 mg by mouth daily.        Marland Kitchen losartan (COZAAR) 100 MG tablet Take 100 mg by mouth daily.        . Melatonin 5 MG CAPS Take 5 mg by mouth at bedtime.      . metoprolol (LOPRESSOR) 50 MG tablet Take 75 mg by mouth 2 (two) times daily.       . pantoprazole (PROTONIX) 40 MG tablet TAKE 1 TABLET BY MOUTH EVERY DAY  30 tablet  1  . Tamsulosin HCl (FLOMAX) 0.4 MG CAPS Take 0.4 mg by mouth daily.       Marland Kitchen triamterene-hydrochlorothiazide (MAXZIDE-25) 37.5-25 MG per tablet Take 1 tablet by mouth See admin instructions. Pt takes Monday through Friday.      . warfarin (COUMADIN) 5 MG tablet  Take 1-2.5 tablets (5-12.5 mg total) by mouth daily. Pt takes 1 tab on tue,wed,thu,sat,and sun. And takes 2 & 1/2 tabs on Mon, and Fri      . [DISCONTINUED] pantoprazole (PROTONIX) 40 MG tablet Take 40 mg by mouth daily as needed. Acid reflux       Allergies as of 09/16/2012 - Review Complete 09/16/2012  Allergen Reaction Noted  . Penicillins Rash     reports that he has never smoked. He does not have any smokeless tobacco history on file. He reports that he does not drink alcohol or use illicit drugs.     Review of Systems: Pertinent positive and negative review of systems were noted in the above HPI section. All other review of systems were otherwise negative.  Vital signs were reviewed in today's medical record Physical Exam: General: Well developed , well nourished, no acute distress Head: Normocephalic and atraumatic Eyes:  sclerae anicteric, EOMI Ears: Normal auditory acuity Mouth: No deformity or lesions Neck: Supple, no masses or thyromegaly Lungs: Clear throughout to auscultation Heart: Regular rate and rhythm; no murmurs, rubs or bruits Abdomen: Soft, non tender and non distended. No masses, hepatosplenomegaly or hernias noted. Normal Bowel sounds  Rectal:deferred Musculoskeletal: Symmetrical with no gross deformities  Skin: No lesions on visible extremities Pulses:  Normal pulses noted Extremities: No clubbing, cyanosis, edema or deformities noted Neurological: Alert oriented x 4, grossly nonfocal Cervical Nodes:  No significant cervical adenopathy Inguinal Nodes: No significant inguinal adenopathy Psychological:  Alert and cooperative. Normal mood and affect

## 2012-09-28 ENCOUNTER — Other Ambulatory Visit (HOSPITAL_COMMUNITY): Payer: Self-pay | Admitting: Cardiovascular Disease

## 2012-09-28 DIAGNOSIS — I731 Thromboangiitis obliterans [Buerger's disease]: Secondary | ICD-10-CM

## 2012-10-01 ENCOUNTER — Encounter (HOSPITAL_COMMUNITY): Payer: Self-pay

## 2012-10-01 ENCOUNTER — Encounter (HOSPITAL_COMMUNITY)
Admission: RE | Admit: 2012-10-01 | Discharge: 2012-10-01 | Disposition: A | Discharge status: Home / Self Care | Payer: Medicare Other | Source: Ambulatory Visit | Attending: Gastroenterology | Admitting: Gastroenterology

## 2012-10-01 DIAGNOSIS — K219 Gastro-esophageal reflux disease without esophagitis: Secondary | ICD-10-CM | POA: Insufficient documentation

## 2012-10-01 DIAGNOSIS — R140 Abdominal distension (gaseous): Secondary | ICD-10-CM

## 2012-10-01 DIAGNOSIS — R142 Eructation: Secondary | ICD-10-CM | POA: Insufficient documentation

## 2012-10-01 DIAGNOSIS — R141 Gas pain: Secondary | ICD-10-CM | POA: Insufficient documentation

## 2012-10-01 MED ORDER — TECHNETIUM TC 99M SULFUR COLLOID
2.2000 | Freq: Once | INTRAVENOUS | Status: AC | PRN
  Administered 2012-10-01: 2.2 via INTRAVENOUS

## 2012-10-16 ENCOUNTER — Telehealth: Payer: Self-pay | Admitting: Gastroenterology

## 2012-10-16 MED ORDER — METOCLOPRAMIDE HCL 10 MG PO TABS: 10.0000 mg | ORAL_TABLET | Freq: Four times a day (QID) | ORAL | Status: DC

## 2012-10-16 NOTE — Telephone Encounter (Signed)
Notes Recorded by Louis Meckel, MD on 10/08/2012 at 9:36 AM Borderline gastroparesis. Let's try Reglan 10 mg one half hour a.c. and at bedtime. Patient should call back in 10 days or so to report progress.  Instruct patient to contact me immediately if he develops any side effects from her Reglan including paresthesias, tremors, confusion , weakness or muscle spasms.   Spoke with Benjamin Mckay and let him know the results per Dr. Arlyce Dice and rx sent to the pharmacy. Benjamin Mckay aware.

## 2012-10-16 NOTE — Telephone Encounter (Signed)
Pt called back and states that he took Reglan in the past and it affected his heart. Please advise what the pt can take instead for his mild gastroparesis.

## 2012-10-19 MED ORDER — ERYTHROMYCIN BASE 250 MG PO TABS: 250.0000 mg | ORAL_TABLET | Freq: Four times a day (QID) | ORAL | Status: DC

## 2012-10-19 NOTE — Telephone Encounter (Signed)
Try erythromycin 250mg  qid C/b in 1-2 weeks

## 2012-10-19 NOTE — Telephone Encounter (Signed)
Pt aware and rx sent to the pharmacy. 

## 2012-10-19 NOTE — Addendum Note (Signed)
Addended by: Selinda Michaels R on: 10/19/2012 09:39 AM   Modules accepted: Orders

## 2012-10-20 ENCOUNTER — Telehealth: Payer: Self-pay | Admitting: *Deleted

## 2012-10-20 NOTE — Telephone Encounter (Signed)
Dr Arlyce Dice, Pharmacy calling stated that there is an interaction with Erytab and Coumadin.  Do want to still prescribe or change it. I told pharmacy not to fill the medication until I spoke with you

## 2012-10-21 MED ORDER — AMBULATORY NON FORMULARY MEDICATION: Status: DC

## 2012-10-21 NOTE — Telephone Encounter (Signed)
CHANGE TO DOMPERIDONE 10mg  1/2 ac and hs

## 2012-10-21 NOTE — Telephone Encounter (Signed)
CALLED PT AND TOLD HIM I WOULD BE SENDING [facility]PERIDONE TO [facility] [place]

## 2012-10-22 ENCOUNTER — Other Ambulatory Visit (HOSPITAL_COMMUNITY): Payer: Self-pay | Admitting: Cardiovascular Disease

## 2012-10-22 DIAGNOSIS — I4891 Unspecified atrial fibrillation: Secondary | ICD-10-CM

## 2012-10-22 DIAGNOSIS — Z950 Presence of cardiac pacemaker: Secondary | ICD-10-CM

## 2012-10-22 DIAGNOSIS — I447 Left bundle-branch block, unspecified: Secondary | ICD-10-CM

## 2012-10-26 ENCOUNTER — Encounter (HOSPITAL_COMMUNITY): Payer: Medicare Other

## 2012-11-04 ENCOUNTER — Ambulatory Visit (HOSPITAL_COMMUNITY)
Admission: RE | Admit: 2012-11-04 | Discharge: 2012-11-04 | Disposition: A | Discharge status: Home / Self Care | Payer: Medicare Other | Source: Ambulatory Visit | Attending: Cardiovascular Disease | Admitting: Cardiovascular Disease

## 2012-11-04 DIAGNOSIS — I447 Left bundle-branch block, unspecified: Secondary | ICD-10-CM

## 2012-11-04 DIAGNOSIS — I4891 Unspecified atrial fibrillation: Secondary | ICD-10-CM

## 2012-11-04 DIAGNOSIS — I731 Thromboangiitis obliterans [Buerger's disease]: Secondary | ICD-10-CM | POA: Insufficient documentation

## 2012-11-04 DIAGNOSIS — I2510 Atherosclerotic heart disease of native coronary artery without angina pectoris: Secondary | ICD-10-CM

## 2012-11-04 DIAGNOSIS — Z950 Presence of cardiac pacemaker: Secondary | ICD-10-CM

## 2012-11-04 HISTORY — PX: OTHER SURGICAL HISTORY: SHX169

## 2012-11-04 HISTORY — PX: TRANSTHORACIC ECHOCARDIOGRAM: SHX275

## 2012-11-04 NOTE — Progress Notes (Signed)
Carotid Duplex Completed. Marigrace Mccole D  

## 2012-11-04 NOTE — Progress Notes (Signed)
2D Echo Performed 11/04/2012    Clearence Ped, RCS

## 2013-01-29 ENCOUNTER — Encounter: Payer: Self-pay | Admitting: Pharmacist Clinician (PhC)/ Clinical Pharmacy Specialist

## 2013-01-29 DIAGNOSIS — I482 Chronic atrial fibrillation, unspecified: Secondary | ICD-10-CM | POA: Insufficient documentation

## 2013-01-29 DIAGNOSIS — I4891 Unspecified atrial fibrillation: Secondary | ICD-10-CM

## 2013-01-29 DIAGNOSIS — Z7901 Long term (current) use of anticoagulants: Secondary | ICD-10-CM | POA: Insufficient documentation

## 2013-02-10 ENCOUNTER — Other Ambulatory Visit: Payer: Self-pay | Admitting: Dermatology

## 2013-03-10 ENCOUNTER — Other Ambulatory Visit: Payer: Self-pay | Admitting: Cardiovascular Disease

## 2013-03-12 ENCOUNTER — Ambulatory Visit (INDEPENDENT_AMBULATORY_CARE_PROVIDER_SITE_OTHER): Payer: Self-pay | Admitting: Pharmacist Clinician (PhC)/ Clinical Pharmacy Specialist

## 2013-03-12 ENCOUNTER — Telehealth: Payer: Self-pay | Admitting: Cardiovascular Disease

## 2013-03-12 DIAGNOSIS — I4891 Unspecified atrial fibrillation: Secondary | ICD-10-CM

## 2013-03-12 DIAGNOSIS — Z7901 Long term (current) use of anticoagulants: Secondary | ICD-10-CM

## 2013-03-12 NOTE — Telephone Encounter (Signed)
Benjamin Mckay,been waiting for lab  Results that you were going to mail to him-you also were suppose to mail him a lab order!

## 2013-03-12 NOTE — Telephone Encounter (Signed)
Lab slip and lab results mailed to pt. And pt. Called and informed

## 2013-04-09 ENCOUNTER — Other Ambulatory Visit: Payer: Self-pay | Admitting: Cardiovascular Disease

## 2013-04-09 LAB — LIPID PANEL
Cholesterol: 184 mg/dL (ref 0–200)
HDL: 33 mg/dL — ABNORMAL LOW (ref >39)
Total CHOL/HDL Ratio: 5.6 Ratio
Triglycerides: 120 mg/dL (ref <150)

## 2013-04-09 LAB — PROTIME-INR
INR: 2.67 — ABNORMAL HIGH (ref <1.50)
Prothrombin Time: 27.3 seconds — ABNORMAL HIGH (ref 11.6–15.2)

## 2013-04-13 ENCOUNTER — Ambulatory Visit (INDEPENDENT_AMBULATORY_CARE_PROVIDER_SITE_OTHER): Payer: Self-pay | Admitting: Pharmacist Clinician (PhC)/ Clinical Pharmacy Specialist

## 2013-04-13 DIAGNOSIS — Z7901 Long term (current) use of anticoagulants: Secondary | ICD-10-CM

## 2013-04-13 DIAGNOSIS — I4891 Unspecified atrial fibrillation: Secondary | ICD-10-CM

## 2013-05-20 ENCOUNTER — Other Ambulatory Visit: Payer: Self-pay | Admitting: Cardiovascular Disease

## 2013-05-24 ENCOUNTER — Ambulatory Visit (INDEPENDENT_AMBULATORY_CARE_PROVIDER_SITE_OTHER): Payer: Self-pay | Admitting: Pharmacist Clinician (PhC)/ Clinical Pharmacy Specialist

## 2013-05-24 ENCOUNTER — Telehealth: Payer: Self-pay | Admitting: Pharmacist Clinician (PhC)/ Clinical Pharmacy Specialist

## 2013-05-24 DIAGNOSIS — Z7901 Long term (current) use of anticoagulants: Secondary | ICD-10-CM

## 2013-05-24 DIAGNOSIS — I4891 Unspecified atrial fibrillation: Secondary | ICD-10-CM

## 2013-05-24 NOTE — Telephone Encounter (Signed)
Have not heard from his PT-had it downstairs on Thursday.

## 2013-05-24 NOTE — Telephone Encounter (Signed)
Noted, see anticoagulation encounter

## 2013-05-31 LAB — PACEMAKER DEVICE OBSERVATION

## 2013-06-01 ENCOUNTER — Other Ambulatory Visit: Payer: Self-pay | Admitting: Cardiovascular Disease

## 2013-06-02 ENCOUNTER — Telehealth: Payer: Self-pay | Admitting: Cardiovascular Disease

## 2013-06-02 LAB — CBC WITH DIFFERENTIAL/PLATELET
Basophils Relative: 0 % (ref 0–1)
Eosinophils Absolute: 0.3 10*3/uL (ref 0.0–0.7)
Eosinophils Relative: 3 % (ref 0–5)
HCT: 44.8 % (ref 39.0–52.0)
Hemoglobin: 15.1 g/dL (ref 13.0–17.0)
Lymphs Abs: 2 10*3/uL (ref 0.7–4.0)
MCH: 32.2 pg (ref 26.0–34.0)
MCHC: 33.7 g/dL (ref 30.0–36.0)
MCV: 95.5 fL (ref 78.0–100.0)
Monocytes Absolute: 0.5 10*3/uL (ref 0.1–1.0)
Monocytes Relative: 6 % (ref 3–12)
Neutrophils Relative %: 66 % (ref 43–77)
RBC: 4.69 MIL/uL (ref 4.22–5.81)

## 2013-06-02 LAB — COMPREHENSIVE METABOLIC PANEL
Albumin: 3.9 g/dL (ref 3.5–5.2)
BUN: 18 mg/dL (ref 6–23)
CO2: 25 mEq/L (ref 19–32)
Glucose, Bld: 108 mg/dL — ABNORMAL HIGH (ref 70–99)
Sodium: 140 mEq/L (ref 135–145)
Total Bilirubin: 1.2 mg/dL (ref 0.3–1.2)
Total Protein: 6.6 g/dL (ref 6.0–8.3)

## 2013-06-02 LAB — TSH: TSH: 1.79 u[IU]/mL (ref 0.350–4.500)

## 2013-06-02 NOTE — Telephone Encounter (Signed)
Spoke with pharmacist at [facility], gave approval for Rx amiodarone to be filled, she will note approval with insurance provider

## 2013-06-02 NOTE — Telephone Encounter (Signed)
Message forwarded to K. Alvstad, PharmD.  

## 2013-06-02 NOTE — Telephone Encounter (Signed)
Need to inform you of the drug interaction for Amiodarone and Warfrain, the patient insurance will not allowed her to refill this until they hear from the doctor. Please Call..   Thanks

## 2013-06-08 ENCOUNTER — Encounter: Payer: Self-pay | Admitting: Cardiovascular Disease

## 2013-06-15 ENCOUNTER — Telehealth: Payer: Self-pay | Admitting: Cardiovascular Disease

## 2013-06-15 NOTE — Telephone Encounter (Signed)
The prescription amiodarone hcl 200mg .. Been taking it for a week and now has a lot of swelling in face, legs and feet , very sensitive to  the sun and he has stop taking it . Please call to give him further instructions on what to do ?

## 2013-06-15 NOTE — Telephone Encounter (Signed)
JC ask RAW about this

## 2013-06-16 NOTE — Telephone Encounter (Signed)
Pt. Instructed to continue taking daily and to use plenty of sunscreen per dr. Alanda Amass

## 2013-06-21 ENCOUNTER — Other Ambulatory Visit: Payer: Self-pay | Admitting: Cardiovascular Disease

## 2013-06-21 LAB — COMPREHENSIVE METABOLIC PANEL
AST: 16 U/L (ref 0–37)
Alkaline Phosphatase: 105 U/L (ref 39–117)
BUN: 14 mg/dL (ref 6–23)
Glucose, Bld: 109 mg/dL — ABNORMAL HIGH (ref 70–99)
Sodium: 138 mEq/L (ref 135–145)
Total Bilirubin: 0.8 mg/dL (ref 0.3–1.2)

## 2013-06-21 LAB — PROTIME-INR: INR: 3.4 — ABNORMAL HIGH (ref <1.50)

## 2013-06-22 ENCOUNTER — Ambulatory Visit (INDEPENDENT_AMBULATORY_CARE_PROVIDER_SITE_OTHER): Payer: Medicare Other | Admitting: Pharmacist Clinician (PhC)/ Clinical Pharmacy Specialist

## 2013-06-22 DIAGNOSIS — I4891 Unspecified atrial fibrillation: Secondary | ICD-10-CM

## 2013-06-22 DIAGNOSIS — Z7901 Long term (current) use of anticoagulants: Secondary | ICD-10-CM

## 2013-07-05 ENCOUNTER — Other Ambulatory Visit: Payer: Self-pay | Admitting: Cardiovascular Disease

## 2013-07-06 ENCOUNTER — Ambulatory Visit (INDEPENDENT_AMBULATORY_CARE_PROVIDER_SITE_OTHER): Payer: Medicare Other | Admitting: Pharmacist Clinician (PhC)/ Clinical Pharmacy Specialist

## 2013-07-06 DIAGNOSIS — I4891 Unspecified atrial fibrillation: Secondary | ICD-10-CM

## 2013-07-06 DIAGNOSIS — Z7901 Long term (current) use of anticoagulants: Secondary | ICD-10-CM

## 2013-07-07 LAB — PACEMAKER DEVICE OBSERVATION

## 2013-07-08 ENCOUNTER — Encounter: Payer: Self-pay | Admitting: Cardiovascular Disease

## 2013-07-13 ENCOUNTER — Encounter: Payer: Self-pay | Admitting: Cardiovascular Disease

## 2013-07-15 ENCOUNTER — Telehealth: Payer: Self-pay | Admitting: Pharmacist Clinician (PhC)/ Clinical Pharmacy Specialist

## 2013-07-15 NOTE — Telephone Encounter (Signed)
Darl Pikes at [facility] concerned with interaction between amiodarone (started in Aug) and simvastatin 80mg .  Per our records, pt is taking pravastatin 80mg  qd.  Gave orders for new rx for pravastatin, [facility] will [facility].    Spoke with pt, he will switch to pravastatin within the week

## 2013-07-15 NOTE — Telephone Encounter (Signed)
Please call-concerning interaction of his medicine.

## 2013-08-05 ENCOUNTER — Other Ambulatory Visit: Payer: Self-pay | Admitting: Cardiovascular Disease

## 2013-08-05 ENCOUNTER — Ambulatory Visit (INDEPENDENT_AMBULATORY_CARE_PROVIDER_SITE_OTHER): Payer: Medicare Other | Admitting: Pharmacist Clinician (PhC)/ Clinical Pharmacy Specialist

## 2013-08-05 DIAGNOSIS — Z7901 Long term (current) use of anticoagulants: Secondary | ICD-10-CM

## 2013-08-05 DIAGNOSIS — I4891 Unspecified atrial fibrillation: Secondary | ICD-10-CM

## 2013-08-05 LAB — PROTIME-INR
INR: 3.21 — ABNORMAL HIGH (ref <1.50)
Prothrombin Time: 31.3 seconds — ABNORMAL HIGH (ref 11.6–15.2)

## 2013-08-19 ENCOUNTER — Other Ambulatory Visit: Payer: Self-pay | Admitting: *Deleted

## 2013-08-19 MED ORDER — METOPROLOL TARTRATE 50 MG PO TABS: 75.0000 mg | ORAL_TABLET | Freq: Two times a day (BID) | ORAL | Status: DC

## 2013-08-19 NOTE — Telephone Encounter (Signed)
Rx was sent to pharmacy electronically. 

## 2013-08-25 ENCOUNTER — Other Ambulatory Visit: Payer: Self-pay | Admitting: Cardiovascular Disease

## 2013-08-25 LAB — PROTIME-INR
INR: 2.75 — ABNORMAL HIGH (ref <1.50)
Prothrombin Time: 27.9 seconds — ABNORMAL HIGH (ref 11.6–15.2)

## 2013-08-27 ENCOUNTER — Ambulatory Visit (INDEPENDENT_AMBULATORY_CARE_PROVIDER_SITE_OTHER): Payer: Medicare Other | Admitting: Pharmacist Clinician (PhC)/ Clinical Pharmacy Specialist

## 2013-08-27 ENCOUNTER — Telehealth: Payer: Self-pay | Admitting: Pharmacist Clinician (PhC)/ Clinical Pharmacy Specialist

## 2013-08-27 DIAGNOSIS — Z7901 Long term (current) use of anticoagulants: Secondary | ICD-10-CM

## 2013-08-27 DIAGNOSIS — I4891 Unspecified atrial fibrillation: Secondary | ICD-10-CM

## 2013-08-27 NOTE — Telephone Encounter (Signed)
See anticoag note

## 2013-08-27 NOTE — Telephone Encounter (Signed)
Patient states that he has his inr checked "downstrairs" on Wednesday of this week and has not gotten his results.

## 2013-09-06 ENCOUNTER — Other Ambulatory Visit: Payer: Self-pay | Admitting: Urology

## 2013-09-06 DIAGNOSIS — N433 Hydrocele, unspecified: Secondary | ICD-10-CM

## 2013-09-07 ENCOUNTER — Ambulatory Visit
Admission: RE | Admit: 2013-09-07 | Discharge: 2013-09-07 | Disposition: A | Discharge status: Home / Self Care | Payer: Medicare Other | Source: Ambulatory Visit | Attending: Urology | Admitting: Urology

## 2013-09-07 DIAGNOSIS — N433 Hydrocele, unspecified: Secondary | ICD-10-CM

## 2013-09-13 ENCOUNTER — Other Ambulatory Visit: Payer: Medicare Other

## 2013-09-15 ENCOUNTER — Ambulatory Visit (INDEPENDENT_AMBULATORY_CARE_PROVIDER_SITE_OTHER): Payer: Medicare Other | Admitting: Pharmacist Clinician (PhC)/ Clinical Pharmacy Specialist

## 2013-09-15 VITALS — BP 122/76 | HR 76

## 2013-09-15 DIAGNOSIS — I4891 Unspecified atrial fibrillation: Secondary | ICD-10-CM

## 2013-09-15 DIAGNOSIS — Z7901 Long term (current) use of anticoagulants: Secondary | ICD-10-CM

## 2013-09-15 LAB — POCT INR: INR: 3.8

## 2013-09-22 ENCOUNTER — Telehealth (HOSPITAL_COMMUNITY): Payer: Self-pay | Admitting: *Deleted

## 2013-09-23 ENCOUNTER — Telehealth (HOSPITAL_COMMUNITY): Payer: Self-pay | Admitting: *Deleted

## 2013-09-24 ENCOUNTER — Telehealth: Payer: Self-pay | Admitting: Pharmacist Clinician (PhC)/ Clinical Pharmacy Specialist

## 2013-09-24 DIAGNOSIS — I4891 Unspecified atrial fibrillation: Secondary | ICD-10-CM

## 2013-09-24 DIAGNOSIS — Z7901 Long term (current) use of anticoagulants: Secondary | ICD-10-CM

## 2013-09-24 NOTE — Telephone Encounter (Signed)
Standing labs to [facility] 

## 2013-09-27 ENCOUNTER — Telehealth (HOSPITAL_COMMUNITY): Payer: Self-pay | Admitting: *Deleted

## 2013-09-29 ENCOUNTER — Ambulatory Visit (INDEPENDENT_AMBULATORY_CARE_PROVIDER_SITE_OTHER): Payer: Medicare Other | Admitting: Pharmacist Clinician (PhC)/ Clinical Pharmacy Specialist

## 2013-09-29 VITALS — BP 116/70 | HR 80

## 2013-09-29 DIAGNOSIS — Z7901 Long term (current) use of anticoagulants: Secondary | ICD-10-CM

## 2013-09-29 DIAGNOSIS — I4891 Unspecified atrial fibrillation: Secondary | ICD-10-CM

## 2013-09-29 LAB — POCT INR
INR: 3
INR: 3

## 2013-11-02 ENCOUNTER — Ambulatory Visit (INDEPENDENT_AMBULATORY_CARE_PROVIDER_SITE_OTHER): Payer: Medicare Other | Admitting: Pharmacist Clinician (PhC)/ Clinical Pharmacy Specialist

## 2013-11-02 ENCOUNTER — Ambulatory Visit (INDEPENDENT_AMBULATORY_CARE_PROVIDER_SITE_OTHER): Payer: Medicare Other | Admitting: Cardiovascular Disease

## 2013-11-02 ENCOUNTER — Telehealth: Payer: Self-pay | Admitting: Cardiovascular Disease

## 2013-11-02 ENCOUNTER — Encounter: Payer: Self-pay | Admitting: Cardiovascular Disease

## 2013-11-02 VITALS — BP 122/86 | HR 65 | Resp 16 | Ht 69.0 in | Wt 222.6 lb

## 2013-11-02 DIAGNOSIS — I4891 Unspecified atrial fibrillation: Secondary | ICD-10-CM

## 2013-11-02 DIAGNOSIS — I442 Atrioventricular block, complete: Secondary | ICD-10-CM

## 2013-11-02 DIAGNOSIS — Z79899 Other long term (current) drug therapy: Secondary | ICD-10-CM

## 2013-11-02 DIAGNOSIS — I2510 Atherosclerotic heart disease of native coronary artery without angina pectoris: Secondary | ICD-10-CM

## 2013-11-02 DIAGNOSIS — Z950 Presence of cardiac pacemaker: Secondary | ICD-10-CM

## 2013-11-02 DIAGNOSIS — R5383 Other fatigue: Secondary | ICD-10-CM

## 2013-11-02 DIAGNOSIS — Z7901 Long term (current) use of anticoagulants: Secondary | ICD-10-CM

## 2013-11-02 DIAGNOSIS — R5381 Other malaise: Secondary | ICD-10-CM

## 2013-11-02 DIAGNOSIS — I255 Ischemic cardiomyopathy: Secondary | ICD-10-CM

## 2013-11-02 DIAGNOSIS — I701 Atherosclerosis of renal artery: Secondary | ICD-10-CM

## 2013-11-02 DIAGNOSIS — I2589 Other forms of chronic ischemic heart disease: Secondary | ICD-10-CM

## 2013-11-02 DIAGNOSIS — E785 Hyperlipidemia, unspecified: Secondary | ICD-10-CM

## 2013-11-02 DIAGNOSIS — I739 Peripheral vascular disease, unspecified: Secondary | ICD-10-CM

## 2013-11-02 LAB — PACEMAKER DEVICE OBSERVATION

## 2013-11-02 LAB — POCT INR: INR: 2.2

## 2013-11-02 NOTE — Patient Instructions (Signed)
Your physician recommends that you return for lab work in: February 2015.  Your physician has recommended that you have a pulmonary function test with [facility].  Pulmonary Function Tests are a group of tests that measure how well air moves in and out of your lungs.   Your physician recommends that you schedule a follow-up appointment in: 3 months for a device check with [facility].  Your physician recommends that you schedule a follow-up appointment in: 6 months with Dr. Sallyanne Kuster.

## 2013-11-02 NOTE — Telephone Encounter (Signed)
Dr. Sallyanne Kuster ordered PFT with [facility] for this patient.  Patient does not want to schedule at this time.  He will come back in 3 months and discuss it again with the physician.

## 2013-11-06 ENCOUNTER — Encounter: Payer: Self-pay | Admitting: Cardiovascular Disease

## 2013-11-06 DIAGNOSIS — Z950 Presence of cardiac pacemaker: Secondary | ICD-10-CM | POA: Insufficient documentation

## 2013-11-06 DIAGNOSIS — I442 Atrioventricular block, complete: Secondary | ICD-10-CM | POA: Insufficient documentation

## 2013-11-06 HISTORY — DX: Atrioventricular block, complete: I44.2

## 2013-11-06 NOTE — Progress Notes (Signed)
Patient ID: EDKER PUNT, male   DOB: 05-26-1946, 68 y.o.   MRN: [id number]    Reason for office visit Complete heart block, pacemaker, coronary artery disease, permanent atrial fibrillation, hyperlipidemia, peripheral arterial disease, renal artery stenosis    Mr. Carmelina Noun is a long-standing patient of Terance Ice who recently retired. He is here to establish new cardiology followup. I met him once before in 2013 when I implanted a new pacemaker.  He has a long-standing history of complete heart block and initially received a pacemaker in 1984. This had a unipolar atrial and ventricular leads. Several generator change outs were performed. The atrial lead fractured but was not replaced secondary to permanent atrial fibrillation. The ventricular lead began to malfunction and a new bipolar lead was implanted at the time of his generator change out in 2013. He is pacemaker dependent. He takes chronic warfarin anticoagulation for atrial fibrillation. Amiodarone is still being used primarily for ventricular rate control.  Has known coronary disease with moderate lesions in all major coronary territories but has no complaints of angina and had a low risk nuclear stress test in 2013. His hyperlipidemia has been difficult to treat because of statin intolerance, but he can take pravastatin without major side effects.  He has mild to moderate ischemic cardiomyopathy with an ejection fraction of 44% by nuclear scintigraphy (inferior wall scar) and 35-40% by echocardiography. He does not have clinical congestive heart failure. He takes beta blockers and angiotensin receptor blockers but does not require loop diuretics.  He has a moderate stenosis of the right internal carotid estimated to be between 50 and 69% stable on several yearly ultrasound scans going back to 2007. He has a minor degree of stenosis in both the right and left renal artery.  Significant noncardiac diseases in the past include  non-Hodgkin's lymphoma of the colon  from which he has been in remission for many years, gastroesophageal reflux disease with a distal esophageal stricture that has required dilatation.   He has no cardiovascular complaints.   Allergies  Allergen Reactions  . Statins   . Penicillins Rash    Current Outpatient Prescriptions  Medication Sig Dispense Refill  . amiodarone (PACERONE) 200 MG tablet Take 200 mg by mouth daily.      Marland Kitchen aspirin 81 MG tablet Take 81 mg by mouth daily.        Marland Kitchen losartan (COZAAR) 100 MG tablet Take 100 mg by mouth daily.        . metoprolol (LOPRESSOR) 50 MG tablet Take 1.5 tablets (75 mg total) by mouth 2 (two) times daily.  270 tablet  3  . pantoprazole (PROTONIX) 40 MG tablet TAKE 1 TABLET BY MOUTH EVERY DAY  30 tablet  1  . pravastatin (PRAVACHOL) 40 MG tablet Take 40 mg by mouth daily.      . Tamsulosin HCl (FLOMAX) 0.4 MG CAPS Take 0.4 mg by mouth daily.       Marland Kitchen triamterene-hydrochlorothiazide (MAXZIDE-25) 37.5-25 MG per tablet Take 1 tablet by mouth See admin instructions. Pt takes Monday through Friday.      . warfarin (COUMADIN) 5 MG tablet Take 1-2.5 tablets (5-12.5 mg total) by mouth daily. Pt takes 1 tab on tue,wed,thu,sat,and sun. And takes 2 & 1/2 tabs on Mon, and Fri       No current facility-administered medications for this visit.    Past Medical History  Diagnosis Date  . Atrial fibrillation   . Lymphoma 1998    of colon   .  Diverticulosis   . Esophageal stricture   . Hyperlipidemia   . Hypertension   . CVA (cerebral infarction)   . Colon polyps   . Urinary frequency   . Umbilical hernia   . Congenital heart block     Past Surgical History  Procedure Laterality Date  . Appendectomy  1953  . Colectomy      for lymphoma  . Pacemaker placement      replaced 3 x  . Tonsillectomy    . Neck fusion    . Back surgery    . Carotid doppler  11/04/2012    Proximal Rt ICA 50-99% diameter reduction; Lft Bulb demonstrated mild amount  homogeneous plaque-not hemodynamically significant; Lft ICA-normal patency.  . Pacemaker generator change  01/31/2012    St Jude [employer] DR RF model [id number] serial [id number]  . Cardiac catheterization  01/06/2003    Recommend medical therapy  . Cardiovascular stress test  07/16/2012    Mild-moderate perfusion defect seen in Basal inferior, Mid inferior, and Apica lateral consistent with infarct/scar. No scintigraphic evidence for inducible myocardial ischemia. No ECG changes. EKG negative for ischemia.  . Transthoracic echocardiogram  11/04/2012    EF 18-56%, systolic function moderately reduced, mild regurg of the aortic and mitral valves.    Family History  Problem Relation Age of Onset  . Prostate cancer Father   . Colon cancer Neg Hx   . Diabetes Maternal Grandmother   . Heart disease Mother     History   Social History  . Marital Status: Married    Spouse Name: N/A    Number of Children: N/A  . Years of Education: N/A   Occupational History  . Not on file.   Social History Main Topics  . Smoking status: Never Smoker   . Smokeless tobacco: Not on file  . Alcohol Use: No  . Drug Use: No  . Sexual Activity: Not on file   Other Topics Concern  . Not on file   Social History Narrative  . No narrative on file    Review of systems: The patient specifically denies any chest pain at rest or with exertion, dyspnea at rest or with exertion, orthopnea, paroxysmal nocturnal dyspnea, syncope, palpitations, focal neurological deficits, intermittent claudication, lower extremity edema, unexplained weight gain, cough, hemoptysis or wheezing.  The patient also denies abdominal pain, nausea, vomiting, dysphagia, diarrhea, constipation, polyuria, polydipsia, dysuria, hematuria, frequency, urgency, abnormal bleeding or bruising, fever, chills, unexpected weight changes, mood swings, change in skin or hair texture, change in voice quality, auditory or visual problems, allergic reactions or  rashes, new musculoskeletal complaints other than usual "aches and pains".   PHYSICAL EXAM BP 122/86  Pulse 65  Resp 16  Ht 5' 9"  (1.753 m)  Wt 100.971 kg (222 lb 9.6 oz)  BMI 32.86 kg/m2  General: Alert, oriented x3, no distress Head: no evidence of trauma, PERRL, EOMI, no exophtalmos or lid lag, no myxedema, no xanthelasma; normal ears, nose and oropharynx Neck: normal jugular venous pulsations and no hepatojugular reflux; brisk carotid pulses without delay and no carotid bruits Chest: clear to auscultation, no signs of consolidation by percussion or palpation, normal fremitus, symmetrical and full respiratory excursions; pacemaker site is healthy Cardiovascular: normal position and quality of the apical impulse, regular rhythm, normal first and paradoxically split second heart sounds, no rubs or gallops, 1/6 systolic aortic focus murmur Abdomen: no tenderness or distention, no masses by palpation, no abnormal pulsatility or arterial bruits, normal bowel sounds,  no hepatosplenomegaly Extremities: no clubbing, cyanosis or edema; 2+ radial, ulnar and brachial pulses bilaterally; 2+ right femoral, posterior tibial and dorsalis pedis pulses; 2+ left femoral, posterior tibial and dorsalis pedis pulses; no subclavian or femoral bruits Neurological: grossly nonfocal   EKG: V paced, background AF or atrial standstill  Lipid Panel     Component Value Date/Time   CHOL 184 04/09/2013 0819   TRIG 120 04/09/2013 0819   HDL 33* 04/09/2013 0819   CHOLHDL 5.6 04/09/2013 0819   VLDL 24 04/09/2013 0819   LDLCALC 127* 04/09/2013 0819    BMET    Component Value Date/Time   NA 138 06/21/2013 0833   K 4.1 06/21/2013 0833   CL 104 06/21/2013 0833   CO2 25 06/21/2013 0833   GLUCOSE 109* 06/21/2013 0833   BUN 14 06/21/2013 0833   CREATININE 1.04 06/21/2013 0833   CREATININE 1.24 01/31/2012 0744   CALCIUM 8.6 06/21/2013 0833   GFRNONAA 59* 01/31/2012 0744   GFRAA 68* 01/31/2012 0744     ASSESSMENT AND PLAN CAD  (coronary artery disease) Inferior wall scar, EF of approximately 40% by echo in scintigraphy. Last coronary angiogram 2004 80% diagonal 1, 40% diagonal 2, 70% mid LAD after diagonal 2, 40% distal LAD, 40% proximal left circumflex, 50% OM1, 40%. Asymptomatic, low risk perfusion pattern.  HLD Target LDL C<70, but he was intolerant of lipid soluble statins and cannot afford Crestor. Focus more on weight loss and improved diet and exercise.  PAD (peripheral artery disease): Carotid Stable on serial annual exams. Records report previous TIA, but he does not recall details.  Renal artery stenosis Well controlled BP, normal renal function.  Atrial fibrillation No high ventricular rates (complete heart block), but apparently was symptomatic in the past. Needs periodic eye exam (ophthalmogy), thyroid and liver tests, baseline PFTs not available, but worth obtaining. Need to review the purpose of amiodarone since he has a single chamber device and permanent AF. I would favor stopping it, but he is reluctant to make major changes in meds. Will discuss again at his next visit. Lifelong warfarin anticoagulation.  CHB (complete heart block) Pacemaker dependent  Single chamber St[employer] Jude pacemaker 2013 Normal device function, 100% VVIR pacing, estimated longevity 11 years, good lead parameters. Very symptomatic when checking for underlying rhythm. Encouraged remote f/u, even if he still wants to come to the office Q3 mos, as we will become aware of problems sooner.  Cardiomyopathy, ischemic EF approx 40%. 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 NYHA class I, euvolemic. On ARB and beta blocker.   Orders Placed This Encounter  Procedures  . Comp Met (CMET)  . CBC  . TSH  . PFT W/ BRONCH (MET-TEST)  . EKG 12-Lead   Meds ordered this encounter  Medications  . pravastatin (PRAVACHOL) 40 MG tablet    Sig: Take 40 mg by  mouth daily.    Holli Humbles, MD, [place] [phone] office [phone] pager

## 2013-11-07 ENCOUNTER — Encounter: Payer: Self-pay | Admitting: Cardiovascular Disease

## 2013-11-07 DIAGNOSIS — I255 Ischemic cardiomyopathy: Secondary | ICD-10-CM | POA: Insufficient documentation

## 2013-11-07 HISTORY — DX: Ischemic cardiomyopathy: I25.5

## 2013-11-07 NOTE — Assessment & Plan Note (Signed)
Normal device function, 100% VVIR pacing, estimated longevity 11 years, good lead parameters. Very symptomatic when checking for underlying rhythm. Encouraged remote f/u, even if he still wants to come to the office Q3 mos, as we will become aware of problems sooner.

## 2013-11-07 NOTE — Assessment & Plan Note (Signed)
Inferior wall scar, EF of approximately 40% by echo in scintigraphy. Last coronary angiogram 2004 80% diagonal 1, 40% diagonal 2, 70% mid LAD after diagonal 2, 40% distal LAD, 40% proximal left circumflex, 50% OM1, 40%. Asymptomatic, low risk perfusion pattern.

## 2013-11-07 NOTE — Assessment & Plan Note (Signed)
EF approx 40%. 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 NYHA class I, euvolemic. On ARB and beta blocker.

## 2013-11-07 NOTE — Assessment & Plan Note (Signed)
Target LDL C<70, but he was intolerant of lipid soluble statins and cannot afford Crestor. Focus more on weight loss and improved diet and exercise.

## 2013-11-07 NOTE — Assessment & Plan Note (Signed)
Well controlled BP, normal renal function.

## 2013-11-07 NOTE — Assessment & Plan Note (Addendum)
Stable on serial annual exams. Records report previous TIA, but he does not recall details.

## 2013-11-07 NOTE — Assessment & Plan Note (Signed)
Pacemaker dependent 

## 2013-11-07 NOTE — Assessment & Plan Note (Addendum)
No high ventricular rates (complete heart block), but apparently was symptomatic in the past. Needs periodic eye exam (ophthalmogy), thyroid and liver tests, baseline PFTs not available, but worth obtaining. Need to review the purpose of amiodarone since he has a single chamber device and permanent AF. I would favor stopping it, but he is reluctant to make major changes in meds. Will discuss again at his next visit. Lifelong warfarin anticoagulation.

## 2013-11-12 LAB — MDC_IDC_ENUM_SESS_TYPE_INCLINIC
Implantable Pulse Generator Serial Number: 7313697
Lead Channel Pacing Threshold Amplitude: 0.75 V
Lead Channel Pacing Threshold Pulse Width: 0.5 ms
Lead Channel Setting Pacing Amplitude: 0.875
Lead Channel Setting Pacing Pulse Width: 0.5 ms
MDC IDC MSMT BATTERY VOLTAGE: 2.99 V
MDC IDC MSMT LEADCHNL RV IMPEDANCE VALUE: 480 Ohm
MDC IDC SET LEADCHNL RV SENSING SENSITIVITY: 2.5 mV
MDC IDC STAT BRADY RV PERCENT PACED: 99 %

## 2013-12-01 ENCOUNTER — Other Ambulatory Visit: Payer: Self-pay | Admitting: Cardiovascular Disease

## 2013-12-01 LAB — COMPREHENSIVE METABOLIC PANEL
ALK PHOS: 89 U/L (ref 39–117)
ALT: 17 U/L (ref 0–53)
AST: 18 U/L (ref 0–37)
Albumin: 4.1 g/dL (ref 3.5–5.2)
BUN: 15 mg/dL (ref 6–23)
CO2: 26 mEq/L (ref 19–32)
CREATININE: 1.23 mg/dL (ref 0.50–1.35)
Calcium: 8.9 mg/dL (ref 8.4–10.5)
Chloride: 100 mEq/L (ref 96–112)
Glucose, Bld: 97 mg/dL (ref 70–99)
Potassium: 4.8 mEq/L (ref 3.5–5.3)
Sodium: 135 mEq/L (ref 135–145)
TOTAL PROTEIN: 6.8 g/dL (ref 6.0–8.3)
Total Bilirubin: 0.8 mg/dL (ref 0.2–1.2)

## 2013-12-01 LAB — CBC
HCT: 49.1 % (ref 39.0–52.0)
Hemoglobin: 17.1 g/dL — ABNORMAL HIGH (ref 13.0–17.0)
MCH: 32.4 pg (ref 26.0–34.0)
MCHC: 34.8 g/dL (ref 30.0–36.0)
MCV: 93 fL (ref 78.0–100.0)
PLATELETS: 185 10*3/uL (ref 150–400)
RBC: 5.28 MIL/uL (ref 4.22–5.81)
RDW: 14.4 % (ref 11.5–15.5)
WBC: 6.4 10*3/uL (ref 4.0–10.5)

## 2013-12-01 LAB — PROTIME-INR
INR: 2.34 — ABNORMAL HIGH (ref <1.50)
Prothrombin Time: 25.1 seconds — ABNORMAL HIGH (ref 11.6–15.2)

## 2013-12-01 LAB — TSH: TSH: 2.971 u[IU]/mL (ref 0.350–4.500)

## 2013-12-02 ENCOUNTER — Ambulatory Visit (INDEPENDENT_AMBULATORY_CARE_PROVIDER_SITE_OTHER): Payer: Medicare Other | Admitting: Pharmacist Clinician (PhC)/ Clinical Pharmacy Specialist

## 2013-12-02 DIAGNOSIS — I4891 Unspecified atrial fibrillation: Secondary | ICD-10-CM

## 2013-12-02 DIAGNOSIS — Z7901 Long term (current) use of anticoagulants: Secondary | ICD-10-CM

## 2013-12-29 ENCOUNTER — Other Ambulatory Visit: Payer: Self-pay | Admitting: Cardiovascular Disease

## 2013-12-30 ENCOUNTER — Ambulatory Visit (INDEPENDENT_AMBULATORY_CARE_PROVIDER_SITE_OTHER): Payer: Medicare Other | Admitting: Pharmacist Clinician (PhC)/ Clinical Pharmacy Specialist

## 2013-12-30 DIAGNOSIS — I4891 Unspecified atrial fibrillation: Secondary | ICD-10-CM

## 2013-12-30 DIAGNOSIS — Z7901 Long term (current) use of anticoagulants: Secondary | ICD-10-CM

## 2013-12-30 LAB — PROTIME-INR
INR: 2.32 — ABNORMAL HIGH (ref <1.50)
Prothrombin Time: 24.9 seconds — ABNORMAL HIGH (ref 11.6–15.2)

## 2014-01-28 ENCOUNTER — Other Ambulatory Visit: Payer: Self-pay | Admitting: Cardiovascular Disease

## 2014-01-28 ENCOUNTER — Ambulatory Visit (INDEPENDENT_AMBULATORY_CARE_PROVIDER_SITE_OTHER): Payer: Medicare Other | Admitting: *Deleted

## 2014-01-28 DIAGNOSIS — I499 Cardiac arrhythmia, unspecified: Secondary | ICD-10-CM

## 2014-01-28 DIAGNOSIS — I4891 Unspecified atrial fibrillation: Secondary | ICD-10-CM

## 2014-01-28 DIAGNOSIS — I442 Atrioventricular block, complete: Secondary | ICD-10-CM

## 2014-01-28 LAB — PACEMAKER DEVICE OBSERVATION

## 2014-01-28 LAB — PROTIME-INR
INR: 1.87 — ABNORMAL HIGH (ref <1.50)
Prothrombin Time: 21.1 seconds — ABNORMAL HIGH (ref 11.6–15.2)

## 2014-02-01 ENCOUNTER — Ambulatory Visit (INDEPENDENT_AMBULATORY_CARE_PROVIDER_SITE_OTHER): Payer: Medicare Other | Admitting: Pharmacist Clinician (PhC)/ Clinical Pharmacy Specialist

## 2014-02-01 DIAGNOSIS — Z7901 Long term (current) use of anticoagulants: Secondary | ICD-10-CM

## 2014-02-01 DIAGNOSIS — I4891 Unspecified atrial fibrillation: Secondary | ICD-10-CM

## 2014-02-04 LAB — MDC_IDC_ENUM_SESS_TYPE_INCLINIC
Battery Remaining Longevity: 135.6 mo
Brady Statistic RV Percent Paced: 99.99 %
Date Time Interrogation Session: 20150417133136
Implantable Pulse Generator Serial Number: 7313697
Lead Channel Impedance Value: 450 Ohm
Lead Channel Pacing Threshold Pulse Width: 0.5 ms
Lead Channel Sensing Intrinsic Amplitude: 12 mV
Lead Channel Setting Pacing Amplitude: 1 V
Lead Channel Setting Sensing Sensitivity: 2.5 mV
MDC IDC MSMT BATTERY VOLTAGE: 2.99 V
MDC IDC MSMT LEADCHNL RV PACING THRESHOLD AMPLITUDE: 0.625 V
MDC IDC SET LEADCHNL RV PACING PULSEWIDTH: 0.5 ms

## 2014-02-04 NOTE — Progress Notes (Signed)
Pacemaker check in clinic. Normal device function. Threshold and impedance consistent with previous measurements. Device programmed to maximize longevity. Permanent AF + Warfarin. No high ventricular rates noted. Device programmed at appropriate safety margins. Histogram distribution appropriate for patient activity level. Device programmed to optimize intrinsic conduction. Estimated longevity 11.3-11.8 years. Patient will follow up with [facility] on 7-16.

## 2014-02-10 ENCOUNTER — Telehealth: Payer: Self-pay | Admitting: Pharmacist Clinician (PhC)/ Clinical Pharmacy Specialist

## 2014-02-10 MED ORDER — SCOPOLAMINE 1 MG/3DAYS TD PT72: 1.0000 | MEDICATED_PATCH | TRANSDERMAL | Status: DC

## 2014-02-10 MED ORDER — PROMETHAZINE HCL 25 MG PO TABS: 25.0000 mg | ORAL_TABLET | Freq: Four times a day (QID) | ORAL | Status: DC | PRN

## 2014-02-10 NOTE — Telephone Encounter (Signed)
Message copied by Rockne Menghini on Thu Feb 10, 2014  9:24 AM ------      Message from: Sanda Klein      Created: Wed Feb 09, 2014  6:46 PM       Yes, please order that for him      [facility]      ----- Message -----         From: Tommy Medal, RPH-CPP         Sent: 02/09/2014   5:12 PM           To: Sanda Klein, MD            Dr. Loletha Grayer            Mr. Snellings is apparently going on a cruise this weekend, and in the past Dr. Rollene Fare has always given him some transderm-scopalamine patches and a few phenergan tablets to help him out.  He doesn't have a PCP, just goes to [facility] when he needs something.  Can we give him a box of patches (lasts 12 days) and about 10 phenergan to get him by?  I did advise him to plan ahead next time and get from elsewhere.              Erasmo Downer        ------

## 2014-03-03 ENCOUNTER — Other Ambulatory Visit: Payer: Self-pay | Admitting: Cardiovascular Disease

## 2014-03-03 LAB — PROTIME-INR
INR: 1.67 — AB (ref <1.50)
Prothrombin Time: 19.4 seconds — ABNORMAL HIGH (ref 11.6–15.2)

## 2014-03-04 ENCOUNTER — Ambulatory Visit (INDEPENDENT_AMBULATORY_CARE_PROVIDER_SITE_OTHER): Payer: Medicare Other | Admitting: Pharmacist Clinician (PhC)/ Clinical Pharmacy Specialist

## 2014-03-04 DIAGNOSIS — I4891 Unspecified atrial fibrillation: Secondary | ICD-10-CM

## 2014-03-04 DIAGNOSIS — Z7901 Long term (current) use of anticoagulants: Secondary | ICD-10-CM

## 2014-03-30 ENCOUNTER — Other Ambulatory Visit: Payer: Self-pay | Admitting: Cardiovascular Disease

## 2014-03-31 LAB — PROTIME-INR
INR: 2.82 — ABNORMAL HIGH (ref <1.50)
PROTHROMBIN TIME: 28.9 s — AB (ref 11.6–15.2)

## 2014-04-01 ENCOUNTER — Ambulatory Visit (INDEPENDENT_AMBULATORY_CARE_PROVIDER_SITE_OTHER): Payer: Medicare Other | Admitting: Pharmacist Clinician (PhC)/ Clinical Pharmacy Specialist

## 2014-04-01 DIAGNOSIS — Z7901 Long term (current) use of anticoagulants: Secondary | ICD-10-CM

## 2014-04-21 ENCOUNTER — Other Ambulatory Visit: Payer: Self-pay | Admitting: Cardiovascular Disease

## 2014-04-28 ENCOUNTER — Encounter: Payer: Medicare Other | Admitting: Cardiovascular Disease

## 2014-05-03 ENCOUNTER — Other Ambulatory Visit: Payer: Self-pay | Admitting: Cardiovascular Disease

## 2014-05-04 LAB — PROTIME-INR
INR: 3.59 — ABNORMAL HIGH (ref <1.50)
PROTHROMBIN TIME: 35.8 s — AB (ref 11.6–15.2)

## 2014-05-05 ENCOUNTER — Ambulatory Visit (INDEPENDENT_AMBULATORY_CARE_PROVIDER_SITE_OTHER): Payer: Medicare Other | Admitting: Pharmacist Clinician (PhC)/ Clinical Pharmacy Specialist

## 2014-05-05 DIAGNOSIS — Z7901 Long term (current) use of anticoagulants: Secondary | ICD-10-CM

## 2014-05-17 ENCOUNTER — Ambulatory Visit (INDEPENDENT_AMBULATORY_CARE_PROVIDER_SITE_OTHER): Payer: Medicare Other | Admitting: Cardiovascular Disease

## 2014-05-17 ENCOUNTER — Encounter: Payer: Self-pay | Admitting: Cardiovascular Disease

## 2014-05-17 VITALS — BP 130/82 | HR 65 | Resp 16 | Ht 69.0 in | Wt 228.0 lb

## 2014-05-17 DIAGNOSIS — I739 Peripheral vascular disease, unspecified: Secondary | ICD-10-CM

## 2014-05-17 DIAGNOSIS — I2510 Atherosclerotic heart disease of native coronary artery without angina pectoris: Secondary | ICD-10-CM

## 2014-05-17 DIAGNOSIS — I4891 Unspecified atrial fibrillation: Secondary | ICD-10-CM

## 2014-05-17 DIAGNOSIS — R5381 Other malaise: Secondary | ICD-10-CM

## 2014-05-17 DIAGNOSIS — R5383 Other fatigue: Secondary | ICD-10-CM

## 2014-05-17 DIAGNOSIS — I255 Ischemic cardiomyopathy: Secondary | ICD-10-CM

## 2014-05-17 DIAGNOSIS — I701 Atherosclerosis of renal artery: Secondary | ICD-10-CM

## 2014-05-17 DIAGNOSIS — I2589 Other forms of chronic ischemic heart disease: Secondary | ICD-10-CM

## 2014-05-17 DIAGNOSIS — E785 Hyperlipidemia, unspecified: Secondary | ICD-10-CM

## 2014-05-17 DIAGNOSIS — E782 Mixed hyperlipidemia: Secondary | ICD-10-CM

## 2014-05-17 DIAGNOSIS — I442 Atrioventricular block, complete: Secondary | ICD-10-CM

## 2014-05-17 DIAGNOSIS — Z79899 Other long term (current) drug therapy: Secondary | ICD-10-CM

## 2014-05-17 DIAGNOSIS — Z950 Presence of cardiac pacemaker: Secondary | ICD-10-CM

## 2014-05-17 DIAGNOSIS — I499 Cardiac arrhythmia, unspecified: Secondary | ICD-10-CM

## 2014-05-17 LAB — MDC_IDC_ENUM_SESS_TYPE_INCLINIC
Battery Remaining Longevity: 134.4 mo
Battery Voltage: 2.96 V
Brady Statistic RV Percent Paced: 99.99 %
Date Time Interrogation Session: 20150804124717
Implantable Pulse Generator Serial Number: 7313697
Lead Channel Impedance Value: 450 Ohm
Lead Channel Setting Pacing Amplitude: 0.875
Lead Channel Setting Sensing Sensitivity: 2.5 mV
MDC IDC MSMT LEADCHNL RV PACING THRESHOLD AMPLITUDE: 0.625 V
MDC IDC MSMT LEADCHNL RV PACING THRESHOLD PULSEWIDTH: 0.5 ms
MDC IDC MSMT LEADCHNL RV SENSING INTR AMPL: 12 mV
MDC IDC SET LEADCHNL RV PACING PULSEWIDTH: 0.5 ms

## 2014-05-17 MED ORDER — TRIAMTERENE-HCTZ 37.5-25 MG PO TABS: 1.0000 | ORAL_TABLET | ORAL | Status: DC

## 2014-05-17 MED ORDER — WARFARIN SODIUM 5 MG PO TABS: 5.0000 mg | ORAL_TABLET | Freq: Every day | ORAL | Status: DC

## 2014-05-17 MED ORDER — AMIODARONE HCL 200 MG PO TABS: 200.0000 mg | ORAL_TABLET | Freq: Every day | ORAL | Status: DC

## 2014-05-17 MED ORDER — PANTOPRAZOLE SODIUM 40 MG PO TBEC: 40.0000 mg | DELAYED_RELEASE_TABLET | Freq: Every day | ORAL | Status: DC

## 2014-05-17 MED ORDER — LOSARTAN POTASSIUM 100 MG PO TABS: 100.0000 mg | ORAL_TABLET | Freq: Every day | ORAL | Status: DC

## 2014-05-17 NOTE — Patient Instructions (Addendum)
Your physician recommends that you return for lab work in: FASTING at your next coumadin check at [facility].   Remote monitoring is used to monitor your pacemaker from home. This monitoring reduces the number of office visits required to check your device to one time per year. It allows [place] to keep an eye on the functioning of your device to ensure it is working properly. You are scheduled for a device check from home on 08-18-2014. You may send your transmission at any time that day. If you have a wireless device, the transmission will be sent automatically. After your physician reviews your transmission, you will receive a postcard with your next transmission date.  Your physician recommends that you schedule a follow-up appointment in: 6 months with Dr.Croitoru

## 2014-05-17 NOTE — Progress Notes (Signed)
Patient ID: Benjamin Mckay, male   DOB: 09-11-1946, 68 y.o.   MRN: [id number]      Reason for office visit [facility]  Benjamin Mckay is feeling well and is here for a routine pacemaker check. He has permanent atrial fibrillation. He has not had any neurological events or bleeding on warfarin anticoagulation. He walks 4 days a week and plays golf without angina or dyspnea.  Pacemaker check shows normal function. 100% V paced, complete heart block, pacemaker dependent. Battery longevity 11 years or more.  He has a long-standing history of complete heart block and initially received a pacemaker in 1984. This had a unipolar atrial and ventricular leads. Several generator change outs were performed. The atrial lead fractured but was not replaced secondary to permanent atrial fibrillation. The ventricular lead began to malfunction and a new bipolar lead was implanted at the time of his generator change out in 2013. He is pacemaker dependent. He takes chronic warfarin anticoagulation for atrial fibrillation. Amiodarone is still being used primarily for ventricular rate control.  Has known coronary disease with moderate lesions in all major coronary territories but has no complaints of angina and had a low risk nuclear stress test in 2013. His hyperlipidemia has been difficult to treat because of statin intolerance, but he can take pravastatin without major side effects.  He has mild to moderate ischemic cardiomyopathy with an ejection fraction of 44% by nuclear scintigraphy (inferior wall scar) and 35-40% by echocardiography. He does not have clinical congestive heart failure. He takes beta blockers and angiotensin receptor blockers but does not require loop diuretics.  He has a moderate stenosis of the right internal carotid estimated to be between 50 and 69% stable on several yearly ultrasound scans going back to 2007. He has a minor degree of stenosis in both the right and left  renal artery.  Significant noncardiac diseases in the past include non-Hodgkin's lymphoma of the colon from which he has been in remission for many years, gastroesophageal reflux disease with a distal esophageal stricture that has required dilatation.  He has no cardiovascular complaints.  Allergies  Allergen Reactions  . Statins   . Penicillins Rash    Current Outpatient Prescriptions  Medication Sig Dispense Refill  . amiodarone (PACERONE) 200 MG tablet Take 1 tablet (200 mg total) by mouth daily.  90 tablet  3  . aspirin 81 MG tablet Take 81 mg by mouth daily.        Marland Kitchen losartan (COZAAR) 100 MG tablet Take 1 tablet (100 mg total) by mouth daily.  90 tablet  3  . metoprolol (LOPRESSOR) 50 MG tablet Take 1.5 tablets (75 mg total) by mouth 2 (two) times daily.  270 tablet  3  . pantoprazole (PROTONIX) 40 MG tablet Take 1 tablet (40 mg total) by mouth daily.  90 tablet  3  . pravastatin (PRAVACHOL) 40 MG tablet Take 40 mg by mouth daily.      . promethazine (PHENERGAN) 25 MG tablet Take 1 tablet (25 mg total) by mouth every 6 (six) hours as needed.  15 tablet  0  . scopolamine (TRANSDERM-SCOP) 1 MG/3DAYS Place 1 patch (1.5 mg total) onto the skin every 3 (three) days.  4 patch  0  . Tamsulosin HCl (FLOMAX) 0.4 MG CAPS Take 0.4 mg by mouth daily.       Marland Kitchen triamterene-hydrochlorothiazide (MAXZIDE-25) 37.5-25 MG per tablet Take 1 tablet by mouth See admin instructions. Pt takes Monday through Friday.  90 tablet  3  . warfarin (COUMADIN) 5 MG tablet Take 1-2.5 tablets (5-12.5 mg total) by mouth daily. Pt takes 1 tab on tue,wed,thu,sat,and sun. And takes 2 & 1/2 tabs on Mon, and Fri  90 tablet  0   No current facility-administered medications for this visit.    Past Medical History  Diagnosis Date  . Atrial fibrillation   . Lymphoma 1998    of colon   . Diverticulosis   . Esophageal stricture   . Hyperlipidemia   . Hypertension   . CVA (cerebral infarction)   . Colon polyps   . Urinary  frequency   . Umbilical hernia   . Congenital heart block   . CHB (complete heart block) 11/06/2013  . Cardiomyopathy, ischemic 11/07/2013    Past Surgical History  Procedure Laterality Date  . Appendectomy  1953  . Colectomy      for lymphoma  . Pacemaker placement      replaced 3 x  . Tonsillectomy    . Neck fusion    . Back surgery    . Carotid doppler  11/04/2012    Proximal Rt ICA 50-99% diameter reduction; Lft Bulb demonstrated mild amount homogeneous plaque-not hemodynamically significant; Lft ICA-normal patency.  . Pacemaker generator change  01/31/2012    [employer] DR RF model [id number] serial [id number]  . Cardiac catheterization  01/06/2003    Recommend medical therapy  . Cardiovascular stress test  07/16/2012    Mild-moderate perfusion defect seen in Basal inferior, Mid inferior, and Apica lateral consistent with infarct/scar. No scintigraphic evidence for inducible myocardial ischemia. No ECG changes. EKG negative for ischemia.  . Transthoracic echocardiogram  11/04/2012    EF 58-52%, systolic function moderately reduced, mild regurg of the aortic and mitral valves.    Family History  Problem Relation Age of Onset  . Prostate cancer Father   . Colon cancer Neg Hx   . Diabetes Maternal Grandmother   . Heart disease Mother     History   Social History  . Marital Status: Married    Spouse Name: N/A    Number of Children: N/A  . Years of Education: N/A   Occupational History  . Not on file.   Social History Main Topics  . Smoking status: Never Smoker   . Smokeless tobacco: Not on file  . Alcohol Use: No  . Drug Use: No  . Sexual Activity: Not on file   Other Topics Concern  . Not on file   Social History Narrative  . No narrative on file    Review of systems: The patient specifically denies any chest pain at rest or with exertion, dyspnea at rest or with exertion, orthopnea, paroxysmal nocturnal dyspnea, syncope, palpitations, focal neurological  deficits, intermittent claudication, lower extremity edema, unexplained weight gain, cough, hemoptysis or wheezing.  The patient also denies abdominal pain, nausea, vomiting, dysphagia, diarrhea, constipation, polyuria, polydipsia, dysuria, hematuria, frequency, urgency, abnormal bleeding or bruising, fever, chills, unexpected weight changes, mood swings, change in skin or hair texture, change in voice quality, auditory or visual problems, allergic reactions or rashes, new musculoskeletal complaints other than usual "aches and pains".   PHYSICAL EXAM BP 130/82  Pulse 65  Ht 5\' 9"  (1.753 m)  Wt 228 lb (103.42 kg)  BMI 33.65 kg/m2  General: Alert, oriented x3, no distress Head: no evidence of trauma, PERRL, EOMI, no exophtalmos or lid lag, no myxedema, no xanthelasma; normal ears, nose and oropharynx Neck: normal jugular  venous pulsations and no hepatojugular reflux; brisk carotid pulses without delay and no carotid bruits Chest: clear to auscultation, no signs of consolidation by percussion or palpation, normal fremitus, symmetrical and full respiratory excursions, healthy pacemaker site Cardiovascular: normal position and quality of the apical impulse, regular rhythm, normal first and paradoxically split second heart sounds, no murmurs, rubs or gallops Abdomen: no tenderness or distention, no masses by palpation, no abnormal pulsatility or arterial bruits, normal bowel sounds, no hepatosplenomegaly Extremities: no clubbing, cyanosis or edema; 2+ radial, ulnar and brachial pulses bilaterally; 2+ right femoral, posterior tibial and dorsalis pedis pulses; 2+ left femoral, posterior tibial and dorsalis pedis pulses; no subclavian or femoral bruits Neurological: grossly nonfocal   EKG: atrial fibrillation, V paced  Lipid Panel     Component Value Date/Time   CHOL 184 04/09/2013 0819   TRIG 120 04/09/2013 0819   HDL 33* 04/09/2013 0819   CHOLHDL 5.6 04/09/2013 0819   VLDL 24 04/09/2013 0819    LDLCALC 127* 04/09/2013 0819    BMET    Component Value Date/Time   NA 135 12/01/2013 1121   K 4.8 12/01/2013 1121   CL 100 12/01/2013 1121   CO2 26 12/01/2013 1121   GLUCOSE 97 12/01/2013 1121   BUN 15 12/01/2013 1121   CREATININE 1.23 12/01/2013 1121   CREATININE 1.24 01/31/2012 0744   CALCIUM 8.9 12/01/2013 1121   GFRNONAA 59* 01/31/2012 0744   GFRAA 68* 01/31/2012 0744     ASSESSMENT AND PLAN CAD (coronary artery disease)  Inferior wall scar, EF of approximately 40% by echo in scintigraphy.  Last coronary angiogram 2004 80% diagonal 1, 40% diagonal 2, 70% mid LAD after diagonal 2, 40% distal LAD, 40% proximal left circumflex, 50% OM1, 40%.  Asymptomatic, low risk perfusion pattern.   Cardiomyopathy, ischemic  EF approx 40%. 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 NYHA class I, euvolemic. On ARB and beta blocker.   HLD  Target LDL C<70, but he was intolerant of lipid soluble statins and cannot afford Crestor. Focus more on weight loss and improved diet and exercise. Time to repeat lipid profile  PAD (peripheral artery disease): Carotid  Stable on serial annual exams. Records report previous TIA, but he does not recall details.   Renal artery stenosis  Well controlled BP, normal renal function.   Atrial fibrillation  On amiodarone. Needs periodic eye exam (ophthalmogy), thyroid and liver tests, baseline PFTs not available, but worth obtaining.  Reviewed the purpose of amiodarone since he has a single chamber device and permanent AF. I would favor stopping it, but he is still reluctant to make major changes in meds. Will discuss again at his next visit.  Lifelong warfarin anticoagulation.   CHB (complete heart block)  Pacemaker dependent   Single chamber St. Jude pacemaker 2013  Normal device function, 100% VVIR pacing, estimated longevity 11 years, good lead parameters. Very symptomatic when checking  for underlying rhythm. Encouraged remote f/u, even if he still wants to come to the office Q3 mos, as we will become aware of problems sooner.    Orders Placed This Encounter  Procedures  . CBC  . Lipid panel  . Comprehensive metabolic panel  . Implantable device check  . EKG 12-Lead   Meds ordered this encounter  Medications  . amiodarone (PACERONE) 200 MG tablet    Sig: Take 1 tablet (200 mg total) by mouth daily.    Dispense:  90 tablet    Refill:  3  . losartan (COZAAR) 100 MG tablet    Sig: Take 1 tablet (100 mg total) by mouth daily.    Dispense:  90 tablet    Refill:  3  . triamterene-hydrochlorothiazide (MAXZIDE-25) 37.5-25 MG per tablet    Sig: Take 1 tablet by mouth See admin instructions. Pt takes Monday through Friday.    Dispense:  90 tablet    Refill:  3  . warfarin (COUMADIN) 5 MG tablet    Sig: Take 1-2.5 tablets (5-12.5 mg total) by mouth daily. Pt takes 1 tab on tue,wed,thu,sat,and sun. And takes 2 & 1/2 tabs on Mon, and Fri    Dispense:  90 tablet    Refill:  0    Restart on Sunday, February 02, 2012  . pantoprazole (PROTONIX) 40 MG tablet    Sig: Take 1 tablet (40 mg total) by mouth daily.    Dispense:  90 tablet    Refill:  Montgomery Emyah Roznowski, MD, [facility] [phone] office [phone] pager

## 2014-05-25 ENCOUNTER — Other Ambulatory Visit: Payer: Self-pay | Admitting: Cardiovascular Disease

## 2014-05-25 LAB — COMPREHENSIVE METABOLIC PANEL
ALBUMIN: 4.1 g/dL (ref 3.5–5.2)
ALT: 20 U/L (ref 0–53)
AST: 19 U/L (ref 0–37)
Alkaline Phosphatase: 87 U/L (ref 39–117)
BUN: 17 mg/dL (ref 6–23)
CALCIUM: 8.7 mg/dL (ref 8.4–10.5)
CHLORIDE: 103 meq/L (ref 96–112)
CO2: 23 meq/L (ref 19–32)
CREATININE: 1.24 mg/dL (ref 0.50–1.35)
Glucose, Bld: 105 mg/dL — ABNORMAL HIGH (ref 70–99)
POTASSIUM: 4.5 meq/L (ref 3.5–5.3)
Sodium: 136 mEq/L (ref 135–145)
Total Bilirubin: 1.1 mg/dL (ref 0.2–1.2)
Total Protein: 7 g/dL (ref 6.0–8.3)

## 2014-05-25 LAB — LIPID PANEL
CHOLESTEROL: 218 mg/dL — AB (ref 0–200)
HDL: 42 mg/dL (ref >39)
LDL Cholesterol: 152 mg/dL — ABNORMAL HIGH (ref 0–99)
TRIGLYCERIDES: 120 mg/dL (ref <150)
Total CHOL/HDL Ratio: 5.2 Ratio
VLDL: 24 mg/dL (ref 0–40)

## 2014-05-25 LAB — CBC
HCT: 46.4 % (ref 39.0–52.0)
Hemoglobin: 16.2 g/dL (ref 13.0–17.0)
MCH: 33.1 pg (ref 26.0–34.0)
MCHC: 34.9 g/dL (ref 30.0–36.0)
MCV: 94.9 fL (ref 78.0–100.0)
Platelets: 184 10*3/uL (ref 150–400)
RBC: 4.89 MIL/uL (ref 4.22–5.81)
RDW: 14.5 % (ref 11.5–15.5)
WBC: 5.5 10*3/uL (ref 4.0–10.5)

## 2014-05-26 ENCOUNTER — Ambulatory Visit (INDEPENDENT_AMBULATORY_CARE_PROVIDER_SITE_OTHER): Payer: Medicare Other | Admitting: Pharmacist Clinician (PhC)/ Clinical Pharmacy Specialist

## 2014-05-26 DIAGNOSIS — I4891 Unspecified atrial fibrillation: Secondary | ICD-10-CM

## 2014-05-26 DIAGNOSIS — Z7901 Long term (current) use of anticoagulants: Secondary | ICD-10-CM

## 2014-05-26 DIAGNOSIS — I4821 Permanent atrial fibrillation: Secondary | ICD-10-CM

## 2014-05-26 LAB — PROTIME-INR
INR: 3.48 — AB (ref <1.50)
PROTHROMBIN TIME: 35 s — AB (ref 11.6–15.2)

## 2014-05-27 ENCOUNTER — Telehealth: Payer: Self-pay | Admitting: *Deleted

## 2014-05-27 DIAGNOSIS — Z79899 Other long term (current) drug therapy: Secondary | ICD-10-CM

## 2014-05-27 DIAGNOSIS — E782 Mixed hyperlipidemia: Secondary | ICD-10-CM

## 2014-05-27 MED ORDER — PRAVASTATIN SODIUM 40 MG PO TABS: 40.0000 mg | ORAL_TABLET | Freq: Every day | ORAL | Status: DC

## 2014-05-27 NOTE — Telephone Encounter (Signed)
LM with lab results.  Rx sent to pharmacy for Pravastatin 40mg  QHS (although it was already on his med list).  Order placed for labs in 3 months.

## 2014-05-27 NOTE — Telephone Encounter (Signed)
Message copied by Tressa Busman on Fri May 27, 2014  9:20 AM ------      Message from: Sanda Klein      Created: Thu May 26, 2014  9:05 AM       Cholesterol is a worse than before and a lot higher than it should be. He could not afford Crestor and did not tolerate lipitor. Let's try pravastatin 40 mg at bedtime daily and recheck in 3 months please. ------

## 2014-05-30 ENCOUNTER — Other Ambulatory Visit: Payer: Self-pay | Admitting: Dermatology

## 2014-06-29 ENCOUNTER — Other Ambulatory Visit: Payer: Self-pay | Admitting: Cardiovascular Disease

## 2014-06-30 LAB — PROTIME-INR
INR: 3.41 — ABNORMAL HIGH (ref <1.50)
Prothrombin Time: 34.4 seconds — ABNORMAL HIGH (ref 11.6–15.2)

## 2014-07-01 ENCOUNTER — Ambulatory Visit (INDEPENDENT_AMBULATORY_CARE_PROVIDER_SITE_OTHER): Payer: Medicare Other | Admitting: Pharmacist Clinician (PhC)/ Clinical Pharmacy Specialist

## 2014-07-01 DIAGNOSIS — I4821 Permanent atrial fibrillation: Secondary | ICD-10-CM

## 2014-07-01 DIAGNOSIS — I4891 Unspecified atrial fibrillation: Secondary | ICD-10-CM

## 2014-07-01 DIAGNOSIS — Z7901 Long term (current) use of anticoagulants: Secondary | ICD-10-CM

## 2014-07-15 ENCOUNTER — Ambulatory Visit (INDEPENDENT_AMBULATORY_CARE_PROVIDER_SITE_OTHER): Payer: Medicare Other | Admitting: Pharmacist Clinician (PhC)/ Clinical Pharmacy Specialist

## 2014-07-15 DIAGNOSIS — Z7901 Long term (current) use of anticoagulants: Secondary | ICD-10-CM

## 2014-07-15 DIAGNOSIS — I4891 Unspecified atrial fibrillation: Secondary | ICD-10-CM

## 2014-07-15 DIAGNOSIS — I4821 Permanent atrial fibrillation: Secondary | ICD-10-CM

## 2014-07-15 DIAGNOSIS — I482 Chronic atrial fibrillation: Secondary | ICD-10-CM

## 2014-07-15 LAB — POCT INR: INR: 2.6

## 2014-08-05 ENCOUNTER — Ambulatory Visit (INDEPENDENT_AMBULATORY_CARE_PROVIDER_SITE_OTHER): Payer: Medicare Other | Admitting: Pharmacist Clinician (PhC)/ Clinical Pharmacy Specialist

## 2014-08-05 DIAGNOSIS — Z7901 Long term (current) use of anticoagulants: Secondary | ICD-10-CM

## 2014-08-05 DIAGNOSIS — I482 Chronic atrial fibrillation: Secondary | ICD-10-CM

## 2014-08-05 DIAGNOSIS — I4891 Unspecified atrial fibrillation: Secondary | ICD-10-CM

## 2014-08-05 DIAGNOSIS — I4821 Permanent atrial fibrillation: Secondary | ICD-10-CM

## 2014-08-05 LAB — POCT INR: INR: 2.5

## 2014-08-18 ENCOUNTER — Encounter: Payer: Medicare Other | Admitting: *Deleted

## 2014-08-18 ENCOUNTER — Telehealth: Payer: Self-pay | Admitting: Cardiology

## 2014-08-18 NOTE — Telephone Encounter (Signed)
LMOVM reminding pt to send remote transmission.   

## 2014-08-19 ENCOUNTER — Encounter: Payer: Self-pay | Admitting: Cardiology

## 2014-08-31 ENCOUNTER — Other Ambulatory Visit: Payer: Self-pay | Admitting: Dermatology

## 2014-09-02 ENCOUNTER — Ambulatory Visit (INDEPENDENT_AMBULATORY_CARE_PROVIDER_SITE_OTHER): Payer: Medicare Other | Admitting: Pharmacist Clinician (PhC)/ Clinical Pharmacy Specialist

## 2014-09-02 DIAGNOSIS — I4891 Unspecified atrial fibrillation: Secondary | ICD-10-CM

## 2014-09-02 DIAGNOSIS — Z7901 Long term (current) use of anticoagulants: Secondary | ICD-10-CM

## 2014-09-02 DIAGNOSIS — I482 Chronic atrial fibrillation: Secondary | ICD-10-CM

## 2014-09-02 DIAGNOSIS — I4821 Permanent atrial fibrillation: Secondary | ICD-10-CM

## 2014-09-02 LAB — POCT INR: INR: 2.8

## 2014-09-03 ENCOUNTER — Ambulatory Visit (INDEPENDENT_AMBULATORY_CARE_PROVIDER_SITE_OTHER): Payer: Medicare Other | Admitting: Family Medicine

## 2014-09-03 VITALS — BP 110/70 | HR 65 | Temp 97.9°F | Resp 16 | Ht 68.5 in | Wt 230.0 lb

## 2014-09-03 DIAGNOSIS — N289 Disorder of kidney and ureter, unspecified: Secondary | ICD-10-CM

## 2014-09-03 DIAGNOSIS — D751 Secondary polycythemia: Secondary | ICD-10-CM

## 2014-09-03 DIAGNOSIS — R21 Rash and other nonspecific skin eruption: Secondary | ICD-10-CM

## 2014-09-03 LAB — COMPREHENSIVE METABOLIC PANEL
ALT: 38 U/L (ref 0–53)
AST: 23 U/L (ref 0–37)
Albumin: 4 g/dL (ref 3.5–5.2)
Alkaline Phosphatase: 89 U/L (ref 39–117)
BUN: 22 mg/dL (ref 6–23)
CHLORIDE: 100 meq/L (ref 96–112)
CO2: 25 mEq/L (ref 19–32)
CREATININE: 1.62 mg/dL — AB (ref 0.50–1.35)
Calcium: 9.1 mg/dL (ref 8.4–10.5)
GLUCOSE: 95 mg/dL (ref 70–99)
Potassium: 4.1 mEq/L (ref 3.5–5.3)
Sodium: 137 mEq/L (ref 135–145)
Total Bilirubin: 0.9 mg/dL (ref 0.2–1.2)
Total Protein: 6.8 g/dL (ref 6.0–8.3)

## 2014-09-03 LAB — CBC
HCT: 50.1 % (ref 39.0–52.0)
Hemoglobin: 17.2 g/dL — ABNORMAL HIGH (ref 13.0–17.0)
MCH: 33.1 pg (ref 26.0–34.0)
MCHC: 34.3 g/dL (ref 30.0–36.0)
MCV: 96.3 fL (ref 78.0–100.0)
MPV: 10.6 fL (ref 9.4–12.4)
PLATELETS: 210 10*3/uL (ref 150–400)
RBC: 5.2 MIL/uL (ref 4.22–5.81)
RDW: 14 % (ref 11.5–15.5)
WBC: 12.8 10*3/uL — ABNORMAL HIGH (ref 4.0–10.5)

## 2014-09-03 NOTE — Progress Notes (Signed)
Procedure: verbal consent obtained. Skin was cleaned with alcohol and anesthetized with 1 cc 1% lido with epi. A 3 mm punch biopsy was obtained from border of lesion on upper left arm. Wound was dressed and wound care discussed.

## 2014-09-03 NOTE — Progress Notes (Addendum)
[facility] [address], [place] [address] [phone]  Date:  09/03/2014   Name:  Benjamin Mckay   DOB:  1946-03-02   MRN:  [id number]  PCP:  Pcp Not In System    Chief Complaint: Rash   History of Present Illness:  Benjamin Mckay is a 68 y.o. very pleasant male patient who presents with the following:  Today is Saturday.  Here today with an itchy rash which appeared this past Monday; it was fine, papular and somewhat itchy.  He saw Dr. Allyson Sabal for the rash this past Thursday- he took a couple of bx and started him on the clobetasole cream, hydroxizine as needed for itching, and prednisone.  He was doing ok, but then last night into today he noted onset of some "welts" across his abdomen and back.  He was not sure if this represented an allergic reaction to one of the meds per Dr. Allyson Sabal so he came in.  The welts are itchy.   He has not been on any new medications otherwise.  No changes at home as far as he knows- no new soaps, etc.   He itched a lot last night.    He otherwise feels well- no SOB, no CP, no fatigue.  He has an extensive cardiac history but no recent complications.  He has had a pacemaker for many years.    Noted mildly low BP but he has not sx.  Better on recheck    BP Readings from Last 3 Encounters:  09/03/14 90/60  05/17/14 130/82  11/02/13 122/86     Patient Active Problem List   Diagnosis Date Noted  . Cardiomyopathy, ischemic 11/07/2013  . CHB (complete heart block) 11/06/2013  . Single chamber St. Jude pacemaker 2013 11/06/2013  . Atrial fibrillation, permanent 01/29/2013  . Long term (current) use of anticoagulants 01/29/2013  . Dyspepsia and other specified disorders of function of stomach 09/16/2012  . HLD 02/01/2012  . HTN (hypertension) 02/01/2012  . PAD (peripheral artery disease): Carotid 02/01/2012  . Renal artery stenosis 02/01/2012  . CAD (coronary artery disease) 02/01/2012  . Pacemaker end of life  02/01/2012  . Umbilical hernia 53/97/6734  . Esophageal reflux 08/21/2011  . Hemorrhage of rectum and anus 04/26/2011  . HERNIA, UMBILICAL 19/37/9024  . UNSPECIFIED CARDIAC DYSRHYTHMIA 11/11/2007  . TIA 11/11/2007  . ESOPHAGEAL STRICTURE 11/11/2007  . DIVERTICULOSIS OF COLON 11/11/2007  . NEOPLASM, MALIGNANT, COLON 09/03/2007    Past Medical History  Diagnosis Date  . Atrial fibrillation   . Lymphoma 1998    of colon   . Diverticulosis   . Esophageal stricture   . Hyperlipidemia   . Hypertension   . CVA (cerebral infarction)   . Colon polyps   . Urinary frequency   . Umbilical hernia   . Congenital heart block   . CHB (complete heart block) 11/06/2013  . Cardiomyopathy, ischemic 11/07/2013    Past Surgical History  Procedure Laterality Date  . Appendectomy  1953  . Colectomy      for lymphoma  . Pacemaker placement      replaced 3 x  . Tonsillectomy    . Neck fusion    . Back surgery    . Carotid doppler  11/04/2012    Proximal Rt ICA 50-99% diameter reduction; Lft Bulb demonstrated mild amount homogeneous plaque-not hemodynamically significant; Lft ICA-normal patency.  . Pacemaker generator change  01/31/2012    [place] [employer] DR  RF model [id number] serial [id number]  . Cardiac catheterization  01/06/2003    Recommend medical therapy  . Cardiovascular stress test  07/16/2012    Mild-moderate perfusion defect seen in Basal inferior, Mid inferior, and Apica lateral consistent with infarct/scar. No scintigraphic evidence for inducible myocardial ischemia. No ECG changes. EKG negative for ischemia.  . Transthoracic echocardiogram  11/04/2012    EF 65-99%, systolic function moderately reduced, mild regurg of the aortic and mitral valves.    History  Substance Use Topics  . Smoking status: Never Smoker   . Smokeless tobacco: Not on file  . Alcohol Use: No    Family History  Problem Relation Age of Onset  . Prostate cancer Father   . Colon cancer Neg Hx   . Diabetes  Maternal Grandmother   . Heart disease Mother     Allergies  Allergen Reactions  . Statins   . Penicillins Rash    Medication list has been reviewed and updated.  Current Outpatient Prescriptions on File Prior to Visit  Medication Sig Dispense Refill  . amiodarone (PACERONE) 200 MG tablet Take 1 tablet (200 mg total) by mouth daily. 90 tablet 3  . aspirin 81 MG tablet Take 81 mg by mouth daily.      . clobetasol cream (TEMOVATE) 0.05 % Apply as directed  1  . losartan (COZAAR) 100 MG tablet Take 1 tablet (100 mg total) by mouth daily. 90 tablet 3  . metoprolol (LOPRESSOR) 50 MG tablet Take 1.5 tablets (75 mg total) by mouth 2 (two) times daily. 270 tablet 3  . pantoprazole (PROTONIX) 40 MG tablet Take 1 tablet (40 mg total) by mouth daily. 90 tablet 3  . pravastatin (PRAVACHOL) 80 MG tablet Take 80 mg by mouth daily.  1  . Tamsulosin HCl (FLOMAX) 0.4 MG CAPS Take 0.4 mg by mouth daily.     Marland Kitchen triamterene-hydrochlorothiazide (MAXZIDE-25) 37.5-25 MG per tablet Take 1 tablet by mouth See admin instructions. Pt takes Monday through Friday. 90 tablet 3  . warfarin (COUMADIN) 5 MG tablet Take 1-2.5 tablets (5-12.5 mg total) by mouth daily. Pt takes 1 tab on tue,wed,thu,sat,and sun. And takes 2 & 1/2 tabs on Mon, and Fri 90 tablet 0   No current facility-administered medications on file prior to visit.    Review of Systems:  As per HPI- otherwise negative.   Physical Examination: Filed Vitals:   09/03/14 1419  BP: 90/60  Pulse: 65  Temp: 97.9 F (36.6 C)  Resp: 16   Filed Vitals:   09/03/14 1419  Height: 5' 8.5" (1.74 m)  Weight: 230 lb (104.327 kg)   Body mass index is 34.46 kg/(m^2). Ideal Body Weight: Weight in (lb) to have BMI = 25: 166.5  GEN: WDWN, NAD, Non-toxic, A & O x 3, overweight, looks well HEENT: Atraumatic, Normocephalic. Neck supple. No masses, No LAD.  No oral lesions Ears and Nose: No external deformity. CV: RRR, No M/G/R. No JVD. No thrill. No extra  heart sounds. PULM: CTA B, no wheezes, crackles, rhonchi. No retractions. No resp. distress. No accessory muscle use. ABD: S, NT, ND, +BS. No rebound. No HSM. EXTR: No c/c/e NEURO Normal gait.  PSYCH: Normally interactive. Conversant. Not depressed or anxious appearing.  Calm demeanor.  He has a fine, papular rash across his upper belly and arms- this has already undergone punch bx.  He then also has discrete, blanching, flat lesions in a symmetrical pattern across his sides, buttocks, upper thighs and upper arms.  They vary in size from 1/2 cm to 2cm in diameter, salmon colored, irregular borders Small punch bx taken from left upper inner arm  Assessment and Plan: Rash and nonspecific skin eruption - Plan: CBC, Comprehensive metabolic panel, Sedimentation Rate, Dermatology pathology  Benjamin Mckay is here today with an unusual rash.  At this time he does not show signs of other more serious systemic disease, and he is already on prednisone.  Await bx result and labs, he will let me know if getting worse!    Signed Lamar Blinks, MD  addnd- received labs.  Called him back- still awaiting his bx result.  However he is slightly polycythemic and his renal function is up a bit.  He will come back in 1-2 weeks for repeat labs.  Will be in touch with his bx result asap  Results for orders placed or performed in visit on 09/03/14  CBC  Result Value Ref Range   WBC 12.8 (H) 4.0 - 10.5 K/uL   RBC 5.20 4.22 - 5.81 MIL/uL   Hemoglobin 17.2 (H) 13.0 - 17.0 g/dL   HCT 50.1 39.0 - 52.0 %   MCV 96.3 78.0 - 100.0 fL   MCH 33.1 26.0 - 34.0 pg   MCHC 34.3 30.0 - 36.0 g/dL   RDW 14.0 11.5 - 15.5 %   Platelets 210 150 - 400 K/uL   MPV 10.6 9.4 - 12.4 fL  Comprehensive metabolic panel  Result Value Ref Range   Sodium 137 135 - 145 mEq/L   Potassium 4.1 3.5 - 5.3 mEq/L   Chloride 100 96 - 112 mEq/L   CO2 25 19 - 32 mEq/L   Glucose, Bld 95 70 - 99 mg/dL   BUN 22 6 - 23 mg/dL   Creat 1.62 (H) 0.50 - 1.35  mg/dL   Total Bilirubin 0.9 0.2 - 1.2 mg/dL   Alkaline Phosphatase 89 39 - 117 U/L   AST 23 0 - 37 U/L   ALT 38 0 - 53 U/L   Total Protein 6.8 6.0 - 8.3 g/dL   Albumin 4.0 3.5 - 5.2 g/dL   Calcium 9.1 8.4 - 10.5 mg/dL  Sedimentation Rate  Result Value Ref Range   Sed Rate 1 0 - 16 mm/hr   He is on prednisone which likely explains mild leukocytosis.

## 2014-09-03 NOTE — Patient Instructions (Signed)
Good to see you today!   I will be in touch with your biopsy asap.  Let me know if you have any other problems in the meantime

## 2014-09-04 LAB — SEDIMENTATION RATE: Sed Rate: 1 mm/hr (ref 0–16)

## 2014-09-05 NOTE — Addendum Note (Signed)
Addended by: Lamar Blinks C on: 09/05/2014 05:33 PM   Modules accepted: Orders

## 2014-09-07 ENCOUNTER — Encounter: Payer: Self-pay | Admitting: *Deleted

## 2014-09-09 ENCOUNTER — Encounter: Payer: Self-pay | Admitting: Family Medicine

## 2014-09-22 ENCOUNTER — Encounter (HOSPITAL_COMMUNITY): Payer: Self-pay | Admitting: Cardiovascular Disease

## 2014-09-30 ENCOUNTER — Ambulatory Visit (INDEPENDENT_AMBULATORY_CARE_PROVIDER_SITE_OTHER): Payer: Medicare Other | Admitting: Pharmacist Clinician (PhC)/ Clinical Pharmacy Specialist

## 2014-09-30 DIAGNOSIS — I4821 Permanent atrial fibrillation: Secondary | ICD-10-CM

## 2014-09-30 DIAGNOSIS — I482 Chronic atrial fibrillation: Secondary | ICD-10-CM

## 2014-09-30 DIAGNOSIS — I4891 Unspecified atrial fibrillation: Secondary | ICD-10-CM

## 2014-09-30 DIAGNOSIS — Z7901 Long term (current) use of anticoagulants: Secondary | ICD-10-CM

## 2014-09-30 LAB — LIPID PANEL
Cholesterol: 224 mg/dL — ABNORMAL HIGH (ref 0–200)
HDL: 43 mg/dL (ref >39)
LDL CALC: 161 mg/dL — AB (ref 0–99)
TRIGLYCERIDES: 99 mg/dL (ref <150)
Total CHOL/HDL Ratio: 5.2 Ratio
VLDL: 20 mg/dL (ref 0–40)

## 2014-09-30 LAB — BASIC METABOLIC PANEL
BUN: 21 mg/dL (ref 6–23)
CHLORIDE: 104 meq/L (ref 96–112)
CO2: 28 meq/L (ref 19–32)
Calcium: 9.2 mg/dL (ref 8.4–10.5)
Creat: 1.46 mg/dL — ABNORMAL HIGH (ref 0.50–1.35)
Glucose, Bld: 100 mg/dL — ABNORMAL HIGH (ref 70–99)
POTASSIUM: 4.9 meq/L (ref 3.5–5.3)
Sodium: 140 mEq/L (ref 135–145)

## 2014-09-30 LAB — POCT INR: INR: 2.9

## 2014-10-03 ENCOUNTER — Encounter: Payer: Self-pay | Admitting: *Deleted

## 2014-10-03 ENCOUNTER — Telehealth: Payer: Self-pay | Admitting: *Deleted

## 2014-10-03 MED ORDER — EZETIMIBE 10 MG PO TABS: 10.0000 mg | ORAL_TABLET | Freq: Every day | ORAL | Status: DC

## 2014-10-03 NOTE — Telephone Encounter (Signed)
Called and notified patient of lab results and recommendations. Patient voiced understanding. Samples of zetia left at the front desk for patient to pick up. Patient also requests a copy of the lab results for his review. These will be left along with his samples.

## 2014-10-03 NOTE — Telephone Encounter (Signed)
-----   Message from Sanda Klein, MD sent at 09/30/2014  5:07 PM EST ----- Cholesterol still quite high. Kidney function better , not yet at baseline though. Is he taking pravastatin? If still on it I would like to add zetia 10 mg daily and recheck in 3 months

## 2014-10-18 ENCOUNTER — Ambulatory Visit (INDEPENDENT_AMBULATORY_CARE_PROVIDER_SITE_OTHER): Payer: Medicare Other | Admitting: Cardiovascular Disease

## 2014-10-18 ENCOUNTER — Encounter: Payer: Self-pay | Admitting: Cardiovascular Disease

## 2014-10-18 VITALS — BP 124/80 | HR 69 | Ht 69.0 in | Wt 232.6 lb

## 2014-10-18 DIAGNOSIS — I481 Persistent atrial fibrillation: Secondary | ICD-10-CM

## 2014-10-18 DIAGNOSIS — I442 Atrioventricular block, complete: Secondary | ICD-10-CM

## 2014-10-18 DIAGNOSIS — E785 Hyperlipidemia, unspecified: Secondary | ICD-10-CM

## 2014-10-18 DIAGNOSIS — I2510 Atherosclerotic heart disease of native coronary artery without angina pectoris: Secondary | ICD-10-CM

## 2014-10-18 DIAGNOSIS — I4819 Other persistent atrial fibrillation: Secondary | ICD-10-CM

## 2014-10-18 DIAGNOSIS — Z950 Presence of cardiac pacemaker: Secondary | ICD-10-CM

## 2014-10-18 DIAGNOSIS — Z79899 Other long term (current) drug therapy: Secondary | ICD-10-CM

## 2014-10-18 DIAGNOSIS — I4821 Permanent atrial fibrillation: Secondary | ICD-10-CM

## 2014-10-18 DIAGNOSIS — I482 Chronic atrial fibrillation: Secondary | ICD-10-CM

## 2014-10-18 LAB — MDC_IDC_ENUM_SESS_TYPE_INCLINIC
Battery Remaining Longevity: 142.8 mo
Battery Voltage: 2.98 V
Date Time Interrogation Session: 20160105130016
Implantable Pulse Generator Model: 1210
Implantable Pulse Generator Serial Number: 7313697
Lead Channel Sensing Intrinsic Amplitude: 12 mV
Lead Channel Setting Pacing Amplitude: 0.875
Lead Channel Setting Pacing Pulse Width: 0.5 ms
Lead Channel Setting Sensing Sensitivity: 2.5 mV
MDC IDC MSMT LEADCHNL RV IMPEDANCE VALUE: 475 Ohm
MDC IDC MSMT LEADCHNL RV PACING THRESHOLD AMPLITUDE: 0.625 V
MDC IDC MSMT LEADCHNL RV PACING THRESHOLD PULSEWIDTH: 0.5 ms
MDC IDC STAT BRADY RV PERCENT PACED: 100 %

## 2014-10-18 MED ORDER — PRAVASTATIN SODIUM 80 MG PO TABS: 80.0000 mg | ORAL_TABLET | Freq: Every day | ORAL | Status: DC

## 2014-10-18 MED ORDER — EZETIMIBE 10 MG PO TABS: 10.0000 mg | ORAL_TABLET | Freq: Every day | ORAL | Status: DC

## 2014-10-18 NOTE — Patient Instructions (Addendum)
Your physician recommends that you return for lab work in: 1 month (fasting) CBC, CMET, TSH, Lipid  Your pravastatin has been refilled Dr. Loletha Grayer has provided zetia samples #28  Remote monitoring is used to monitor your Pacemaker of ICD from home. This monitoring reduces the number of office visits required to check your device to one time per year. It allows [place] to keep an eye on the functioning of your device to ensure it is working properly. You are scheduled for a device check from home on 01/16/2014. You may send your transmission at any time that day. If you have a wireless device, the transmission will be sent automatically. After your physician reviews your transmission, you will receive a postcard with your next transmission date.  Your physician wants you to follow-up in: 1 year with Dr. Sallyanne Kuster. You will receive a reminder letter in the mail two months in advance. If you don't receive a letter, please call our office to schedule the follow-up appointment.

## 2014-10-21 NOTE — Progress Notes (Signed)
Patient ID: Benjamin Mckay, male   DOB: 06-25-1946, 69 y.o.   MRN: [id number]      Reason for office visit Complete heart block, pacemaker follow-up, CAD, ischemic cardiomyopathy  Benjamin Mckay has no complaints other than shortness of breath when climbing stairs. He believes this is unchanged from previous visits. He continues to walk on it regular basis and plays golf. He denies angina or dyspnea with usual activities. He has not had new focal neurological events and denies any bleeding while on warfarin anticoagulation that has been well regulated.  Pacemaker interrogation shows normal device function. He has complete heart block and is pacemaker dependent with 100% ventricular pacing and no reliable underlying escape rhythm. His device is a single chamber St. Jude device. She'll device was implanted in 1984 and he has had several generator change out's most recently with a generator change and a new ventricular lead performed in 2013.   Has known coronary disease with moderate lesions in all major coronary territories but has no complaints of angina and had a low risk nuclear stress test in 2013. His hyperlipidemia has been difficult to treat because of statin intolerance, but he can take pravastatin without major side effects.  He has mild to moderate ischemic cardiomyopathy with an ejection fraction of 44% by nuclear scintigraphy (inferior wall scar) and 35-40% by echocardiography. He does not have clinical congestive heart failure. He takes beta blockers and angiotensin receptor blockers but does not require loop diuretics.  He has a moderate stenosis of the right internal carotid estimated to be between 50 and 69% stable on several yearly ultrasound scans going back to 2007. He has a minor degree of stenosis in both the right and left renal artery.  Significant noncardiac diseases in the past include non-Hodgkin's lymphoma of the colon from which he has been in remission for many years,  gastroesophageal reflux disease with a distal esophageal stricture that has required dilatation.   He has no cardiovascular complaints.   Allergies  Allergen Reactions  . Statins   . Penicillins Rash    Current Outpatient Prescriptions  Medication Sig Dispense Refill  . amiodarone (PACERONE) 200 MG tablet Take 1 tablet (200 mg total) by mouth daily. 90 tablet 3  . aspirin 81 MG tablet Take 81 mg by mouth daily.      . clobetasol cream (TEMOVATE) 0.05 % Apply as directed  1  . ezetimibe (ZETIA) 10 MG tablet Take 1 tablet (10 mg total) by mouth daily. 28 tablet 0  . hydrocortisone cream 1 % Apply 1 application topically 2 (two) times daily.    . hydrOXYzine (ATARAX/VISTARIL) 10 MG tablet Take 10 mg by mouth 3 (three) times daily as needed.    Marland Kitchen losartan (COZAAR) 100 MG tablet Take 1 tablet (100 mg total) by mouth daily. 90 tablet 3  . metoprolol (LOPRESSOR) 50 MG tablet Take 1.5 tablets (75 mg total) by mouth 2 (two) times daily. 270 tablet 3  . pantoprazole (PROTONIX) 40 MG tablet Take 1 tablet (40 mg total) by mouth daily. 90 tablet 3  . pravastatin (PRAVACHOL) 80 MG tablet Take 1 tablet (80 mg total) by mouth daily. 90 tablet 3  . prednisoLONE 5 MG TABS tablet Take by mouth taper from 4 doses each day to 1 dose and stop.    . Tamsulosin HCl (FLOMAX) 0.4 MG CAPS Take 0.4 mg by mouth daily.     Marland Kitchen triamterene-hydrochlorothiazide (MAXZIDE-25) 37.5-25 MG per tablet Take 1 tablet by mouth See admin  instructions. Pt takes Monday through Friday. 90 tablet 3  . warfarin (COUMADIN) 5 MG tablet Take 1-2.5 tablets (5-12.5 mg total) by mouth daily. Pt takes 1 tab on tue,wed,thu,sat,and sun. And takes 2 & 1/2 tabs on Mon, and Fri 90 tablet 0   No current facility-administered medications for this visit.    Past Medical History  Diagnosis Date  . Atrial fibrillation   . Lymphoma 1998    of colon   . Diverticulosis   . Esophageal stricture   . Hyperlipidemia   . Hypertension   . CVA  (cerebral infarction)   . Colon polyps   . Urinary frequency   . Umbilical hernia   . Congenital heart block   . CHB (complete heart block) 11/06/2013  . Cardiomyopathy, ischemic 11/07/2013    Past Surgical History  Procedure Laterality Date  . Appendectomy  1953  . Colectomy      for lymphoma  . Pacemaker placement      replaced 3 x  . Tonsillectomy    . Neck fusion    . Back surgery    . Carotid doppler  11/04/2012    Proximal Rt ICA 50-99% diameter reduction; Lft Bulb demonstrated mild amount homogeneous plaque-not hemodynamically significant; Lft ICA-normal patency.  . Pacemaker generator change  01/31/2012    St Jude Med Acc[employer] DR RF model [id number] serial [phone]  . Cardiac catheterization  01/06/2003    Recommend medical therapy  . Cardiovascular stress test  07/16/2012    Mild-moderate perfusion defect seen in Basal inferior, Mid inferior, and Apica lateral consistent with infarct/scar. No scintigraphic evidence for inducible myocardial ischemia. No ECG changes. EKG negative for ischemia.  . Transthoracic echocardiogram  11/04/2012    EF 16-38%, systolic function moderately reduced, mild regurg of the aortic and mitral valves.  . Permanent pacemaker generator change N/A 01/31/2012    Procedure: PERMANENT PACEMAKER GENERATOR CHANGE;  Surgeon: Sanda Klein, MD;  Location: [facility];  Service: Cardiovascular;  Laterality: N/A;    Family History  Problem Relation Age of Onset  . Prostate cancer Father   . Colon cancer Neg Hx   . Diabetes Maternal Grandmother   . Heart disease Mother     History   Social History  . Marital Status: Married    Spouse Name: N/A    Number of Children: N/A  . Years of Education: N/A   Occupational History  . Not on file.   Social History Main Topics  . Smoking status: Never Smoker   . Smokeless tobacco: Not on file  . Alcohol Use: No  . Drug Use: No  . Sexual Activity: Not on file   Other Topics Concern  . Not on file   Social  History Narrative    Review of systems: The patient specifically denies any chest pain at rest or with exertion, dyspnea at rest or with exertion, orthopnea, paroxysmal nocturnal dyspnea, syncope, palpitations, focal neurological deficits, intermittent claudication, lower extremity edema, unexplained weight gain, cough, hemoptysis or wheezing.  The patient also denies abdominal pain, nausea, vomiting, dysphagia, diarrhea, constipation, polyuria, polydipsia, dysuria, hematuria, frequency, urgency, abnormal bleeding or bruising, fever, chills, unexpected weight changes, mood swings, change in skin or hair texture, change in voice quality, auditory or visual problems, allergic reactions or rashes, new musculoskeletal complaints other than usual "aches and pains".   PHYSICAL EXAM BP 124/80 mmHg  Pulse 69  Ht _0  (1.753 m)  Wt 232 lb 9.6 oz (105.507 kg)  BMI 34.33  kg/m2 General: Alert, oriented x3, no distress Head: no evidence of trauma, PERRL, EOMI, no exophtalmos or lid lag, no myxedema, no xanthelasma; normal ears, nose and oropharynx Neck: normal jugular venous pulsations and no hepatojugular reflux; brisk carotid pulses without delay and no carotid bruits Chest: clear to auscultation, no signs of consolidation by percussion or palpation, normal fremitus, symmetrical and full respiratory excursions; pacemaker site is healthy Cardiovascular: normal position and quality of the apical impulse, regular rhythm, normal first and paradoxically split second heart sounds, no rubs or gallops, 1/6 systolic aortic focus murmur Abdomen: no tenderness or distention, no masses by palpation, no abnormal pulsatility or arterial bruits, normal bowel sounds, no hepatosplenomegaly Extremities: no clubbing, cyanosis or edema; 2+ radial, ulnar and brachial pulses bilaterally; 2+ right femoral, posterior tibial and dorsalis pedis pulses; 2+ left femoral, posterior tibial and dorsalis pedis pulses; no subclavian or  femoral bruits Neurological: grossly nonfocal   EKG: V paced, background AF or atrial standstill  Lipid Panel     Component Value Date/Time   CHOL 224* 09/30/2014 1000   TRIG 99 09/30/2014 1000   HDL 43 09/30/2014 1000   CHOLHDL 5.2 09/30/2014 1000   VLDL 20 09/30/2014 1000   LDLCALC 161* 09/30/2014 1000    BMET    Component Value Date/Time   NA 140 09/30/2014 1000   K 4.9 09/30/2014 1000   CL 104 09/30/2014 1000   CO2 28 09/30/2014 1000   GLUCOSE 100* 09/30/2014 1000   BUN 21 09/30/2014 1000   CREATININE 1.46* 09/30/2014 1000   CREATININE 1.24 01/31/2012 0744   CALCIUM 9.2 09/30/2014 1000   GFRNONAA 59* 01/31/2012 0744   GFRAA 68* 01/31/2012 0744     ASSESSMENT AND PLAN CAD (coronary artery disease) Inferior wall scar, EF of approximately 40% by echo in scintigraphy. Last coronary angiogram 2004 80% diagonal 1, 40% diagonal 2, 70% mid LAD after diagonal 2, 40% distal LAD, 40% proximal left circumflex, 50% OM1, 40%. Asymptomatic, low risk perfusion pattern.  HLD on pravastatin Target LDL C<70, but he was intolerant of lipid soluble statins and cannot afford Crestor. Focus more on weight loss and improved diet and exercise. And to repeat lipid profile  PAD (peripheral artery disease): Carotid Stable on serial annual exams. Records report previous TIA, but he does not recall details.  Renal artery stenosis Well controlled BP, normal renal function.  Atrial fibrillation No high ventricular rates (complete heart block), but apparently was symptomatic in the past. I don't think needs amiodarone since he has a single chamber device and permanent AF. I would favor stopping it, but he is still reluctant to make major changes in meds that he took for many years under Dr. Lowella Fairy tutelage. Will discuss again at each visit. As long as he takes amiodarone he requires periodic liver function tests and thyroid function studies Lifelong warfarin anticoagulation.  CHB  (complete heart block) Pacemaker dependent  Single chamber St. Jude pacemaker 2013 Normal device function, 100% VVIR pacing, estimated longevity 12 years, good lead parameters. Very symptomatic when checking for underlying rhythm. Encouraged remote f/u, even if he still wants to come to the office Q3 mos, as we will become aware of problems sooner. Heart rate histogram distribution is good  Cardiomyopathy, ischemic EF approx 40%. 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 NYHA class I, euvolemic. On ARB and beta blocker.  Orders Placed This Encounter  Procedures  . Lipid panel  .  Comp Met (CMET)  . CBC  . TSH  . Implantable device check  . EKG 12-Lead   Meds ordered this encounter  Medications  . pravastatin (PRAVACHOL) 80 MG tablet    Sig: Take 1 tablet (80 mg total) by mouth daily.    Dispense:  90 tablet    Refill:  3  . ezetimibe (ZETIA) 10 MG tablet    Sig: Take 1 tablet (10 mg total) by mouth daily.    Dispense:  28 tablet    Refill:  0    Amoni Scallan  Sanda Klein, MD, [facility] [phone] office [phone] pager

## 2014-11-01 ENCOUNTER — Encounter: Payer: Self-pay | Admitting: Cardiovascular Disease

## 2014-11-16 ENCOUNTER — Other Ambulatory Visit: Payer: Self-pay

## 2014-11-16 ENCOUNTER — Other Ambulatory Visit: Payer: Self-pay | Admitting: Cardiovascular Disease

## 2014-11-16 MED ORDER — METOPROLOL TARTRATE 50 MG PO TABS: 75.0000 mg | ORAL_TABLET | Freq: Two times a day (BID) | ORAL | Status: DC

## 2014-12-01 ENCOUNTER — Other Ambulatory Visit: Payer: Self-pay | Admitting: Cardiovascular Disease

## 2014-12-02 ENCOUNTER — Ambulatory Visit (INDEPENDENT_AMBULATORY_CARE_PROVIDER_SITE_OTHER): Payer: Medicare Other | Admitting: Pharmacist Clinician (PhC)/ Clinical Pharmacy Specialist

## 2014-12-02 DIAGNOSIS — I482 Chronic atrial fibrillation: Secondary | ICD-10-CM

## 2014-12-02 DIAGNOSIS — I4821 Permanent atrial fibrillation: Secondary | ICD-10-CM

## 2014-12-02 LAB — COMPREHENSIVE METABOLIC PANEL
ALBUMIN: 3.7 g/dL (ref 3.5–5.2)
ALT: 25 U/L (ref 0–53)
AST: 22 U/L (ref 0–37)
Alkaline Phosphatase: 78 U/L (ref 39–117)
BUN: 13 mg/dL (ref 6–23)
CALCIUM: 8.6 mg/dL (ref 8.4–10.5)
CHLORIDE: 104 meq/L (ref 96–112)
CO2: 26 meq/L (ref 19–32)
Creat: 1.13 mg/dL (ref 0.50–1.35)
GLUCOSE: 96 mg/dL (ref 70–99)
POTASSIUM: 4.4 meq/L (ref 3.5–5.3)
Sodium: 138 mEq/L (ref 135–145)
Total Bilirubin: 1.1 mg/dL (ref 0.2–1.2)
Total Protein: 6.4 g/dL (ref 6.0–8.3)

## 2014-12-02 LAB — LIPID PANEL
CHOLESTEROL: 158 mg/dL (ref 0–200)
HDL: 35 mg/dL — ABNORMAL LOW (ref >39)
LDL Cholesterol: 105 mg/dL — ABNORMAL HIGH (ref 0–99)
Total CHOL/HDL Ratio: 4.5 Ratio
Triglycerides: 91 mg/dL (ref <150)
VLDL: 18 mg/dL (ref 0–40)

## 2014-12-02 LAB — PROTIME-INR
INR: 2.91 — ABNORMAL HIGH (ref <1.50)
Prothrombin Time: 30.4 seconds — ABNORMAL HIGH (ref 11.6–15.2)

## 2014-12-02 LAB — TSH: TSH: 1.713 u[IU]/mL (ref 0.350–4.500)

## 2014-12-02 LAB — CBC
HEMATOCRIT: 45.4 % (ref 39.0–52.0)
HEMOGLOBIN: 15.1 g/dL (ref 13.0–17.0)
MCH: 32.9 pg (ref 26.0–34.0)
MCHC: 33.3 g/dL (ref 30.0–36.0)
MCV: 98.9 fL (ref 78.0–100.0)
MPV: 10.4 fL (ref 8.6–12.4)
Platelets: 199 10*3/uL (ref 150–400)
RBC: 4.59 MIL/uL (ref 4.22–5.81)
RDW: 14 % (ref 11.5–15.5)
WBC: 5.7 10*3/uL (ref 4.0–10.5)

## 2014-12-05 ENCOUNTER — Ambulatory Visit (INDEPENDENT_AMBULATORY_CARE_PROVIDER_SITE_OTHER): Payer: Medicare Other | Admitting: Family Medicine

## 2014-12-05 ENCOUNTER — Ambulatory Visit (HOSPITAL_COMMUNITY)
Admission: RE | Admit: 2014-12-05 | Discharge: 2014-12-05 | Disposition: A | Discharge status: Home / Self Care | Payer: Medicare Other | Source: Ambulatory Visit | Attending: Family Medicine | Admitting: Family Medicine

## 2014-12-05 VITALS — BP 152/80 | HR 70 | Temp 98.0°F | Resp 20 | Ht 68.0 in | Wt 222.2 lb

## 2014-12-05 DIAGNOSIS — R112 Nausea with vomiting, unspecified: Secondary | ICD-10-CM | POA: Diagnosis not present

## 2014-12-05 DIAGNOSIS — R110 Nausea: Secondary | ICD-10-CM | POA: Diagnosis not present

## 2014-12-05 DIAGNOSIS — I4891 Unspecified atrial fibrillation: Secondary | ICD-10-CM

## 2014-12-05 DIAGNOSIS — R42 Dizziness and giddiness: Secondary | ICD-10-CM | POA: Diagnosis not present

## 2014-12-05 DIAGNOSIS — I10 Essential (primary) hypertension: Secondary | ICD-10-CM

## 2014-12-05 DIAGNOSIS — R2689 Other abnormalities of gait and mobility: Secondary | ICD-10-CM | POA: Diagnosis not present

## 2014-12-05 DIAGNOSIS — R270 Ataxia, unspecified: Secondary | ICD-10-CM

## 2014-12-05 DIAGNOSIS — K529 Noninfective gastroenteritis and colitis, unspecified: Secondary | ICD-10-CM | POA: Diagnosis not present

## 2014-12-05 LAB — POCT CBC
GRANULOCYTE PERCENT: 67.5 % (ref 37–80)
HEMATOCRIT: 52.8 % (ref 43.5–53.7)
HEMOGLOBIN: 17.7 g/dL (ref 14.1–18.1)
Lymph, poc: 2.2 (ref 0.6–3.4)
MCH, POC: 33.3 pg — AB (ref 27–31.2)
MCHC: 33.6 g/dL (ref 31.8–35.4)
MCV: 99.1 fL — AB (ref 80–97)
MID (CBC): 0.5 (ref 0–0.9)
MPV: 7.8 fL (ref 0–99.8)
POC GRANULOCYTE: 5.5 (ref 2–6.9)
POC LYMPH %: 26.3 % (ref 10–50)
POC MID %: 6.2 % (ref 0–12)
Platelet Count, POC: 216 10*3/uL (ref 142–424)
RBC: 5.32 M/uL (ref 4.69–6.13)
RDW, POC: 13.8 %
WBC: 8.2 10*3/uL (ref 4.6–10.2)

## 2014-12-05 LAB — POCT URINALYSIS DIPSTICK
Bilirubin, UA: NEGATIVE
Glucose, UA: NEGATIVE
KETONES UA: NEGATIVE
Leukocytes, UA: NEGATIVE
Nitrite, UA: NEGATIVE
PH UA: 6.5
Protein, UA: NEGATIVE
Spec Grav, UA: 1.015
UROBILINOGEN UA: 1

## 2014-12-05 LAB — GLUCOSE, POCT (MANUAL RESULT ENTRY): POC Glucose: 86 mg/dl (ref 70–99)

## 2014-12-05 MED ORDER — ALPRAZOLAM 0.25 MG PO TABS: 0.2500 mg | ORAL_TABLET | Freq: Three times a day (TID) | ORAL | Status: DC | PRN

## 2014-12-05 MED ORDER — PROMETHAZINE HCL 25 MG PO TABS
25.0000 mg | ORAL_TABLET | Freq: Three times a day (TID) | ORAL | Status: DC | PRN
Start: 2014-12-05 — End: 2014-12-13

## 2014-12-05 NOTE — Progress Notes (Addendum)
This chart was scribed for Benjamin Cheadle, MD by Benjamin Mckay, ED Scribe. This patient was seen in room 1 and the patient's care was started at 6:49 PM.  Subjective:    Patient ID: Benjamin Mckay, male    DOB: [birth year], 69 y.o.   MRN: [id number]  Chief Complaint  Patient presents with  . Dizziness    started once on saturday and then again today.  this happens with nausea and today he has been afraid to eat    HPI DEMON VOLANTE is a 69 y.o. Male with PMhx of afib and HTN who presents to the office complaining of dizziness with onset 3 days ago. Pt states that the dizziness feels like an imbalance. He states that its pretty constant but made better when he sits still. Pt reports associated nausea and two episodes of emesis. He denies any medicinal changes. Last visit with his cardiologist was last week, and everything was reported normal. Pt reports taking promethazine, which made him sleep, but when he woke up the dizziness was still there. He is able to tolerate enough liquid to take his daily medications. Pt denies any fever, neck pain, sore throat, visual disturbance, CP, cough, recent falls, recent head trauma, SOB, abdominal pain, nausea, emesis, diarrhea, urinary symptoms, back pain, HA, palpitations, weakness, numbness and rash as associated symptoms.    Past Medical History  Diagnosis Date  . Atrial fibrillation   . Lymphoma 1998    of colon   . Diverticulosis   . Esophageal stricture   . Hyperlipidemia   . Hypertension   . CVA (cerebral infarction)   . Colon polyps   . Urinary frequency   . Umbilical hernia   . Congenital heart block   . CHB (complete heart block) 11/06/2013  . Cardiomyopathy, ischemic 11/07/2013   Allergies  Allergen Reactions  . Statins   . Penicillins Rash   Current Outpatient Prescriptions on File Prior to Visit  Medication Sig Dispense Refill  . amiodarone (PACERONE) 200 MG tablet Take 1 tablet (200 mg total) by mouth daily. 90 tablet 3  .  aspirin 81 MG tablet Take 81 mg by mouth daily.      . hydrOXYzine (ATARAX/VISTARIL) 10 MG tablet Take 10 mg by mouth 3 (three) times daily as needed.    Marland Kitchen losartan (COZAAR) 100 MG tablet Take 1 tablet (100 mg total) by mouth daily. 90 tablet 3  . metoprolol (LOPRESSOR) 50 MG tablet Take 1.5 tablets (75 mg total) by mouth 2 (two) times daily. 270 tablet 3  . pantoprazole (PROTONIX) 40 MG tablet Take 1 tablet (40 mg total) by mouth daily. 90 tablet 3  . pravastatin (PRAVACHOL) 80 MG tablet Take 1 tablet (80 mg total) by mouth daily. 90 tablet 3  . Tamsulosin HCl (FLOMAX) 0.4 MG CAPS Take 0.4 mg by mouth daily.     Marland Kitchen triamterene-hydrochlorothiazide (MAXZIDE-25) 37.5-25 MG per tablet Take 1 tablet by mouth See admin instructions. Pt takes Monday through Friday. 90 tablet 3  . warfarin (COUMADIN) 5 MG tablet Take 1-2.5 tablets (5-12.5 mg total) by mouth daily. Pt takes 1 tab on tue,wed,thu,sat,and sun. And takes 2 & 1/2 tabs on Mon, and Fri 90 tablet 0   No current facility-administered medications on file prior to visit.    Review of Systems  Constitutional: Positive for appetite change. Negative for fever and chills.  HENT: Negative for congestion and ear discharge.   Eyes: Negative for discharge.  Respiratory: Negative for cough  and shortness of breath.   Cardiovascular: Negative for chest pain and leg swelling.  Gastrointestinal: Negative for nausea, vomiting, abdominal pain and diarrhea.  Genitourinary: Negative for difficulty urinating.  Musculoskeletal: Negative for back pain.  Skin: Negative for rash.  Neurological: Positive for dizziness. Negative for weakness, numbness and headaches.    Triage vitals: BP 152/80 mmHg  Pulse 70  Temp(Src) 98 F (36.7 C) (Oral)  Resp 20  Ht 5\' 8"  (1.727 m)  Wt 222 lb 4 oz (100.812 kg)  BMI 33.80 kg/m2  SpO2 95%  Objective:   Physical Exam  Constitutional: He appears well-developed and well-nourished. No distress.  HENT:  Head: Normocephalic  and atraumatic.  Right Ear: External ear normal.  Left Ear: External ear normal.  Mouth/Throat: Oropharynx is clear and moist. No oropharyngeal exudate.  Nasal edema with rhinitis. TM's clear bilaterally.   Eyes: Conjunctivae are normal. Right eye exhibits no discharge. Left eye exhibits no discharge.  Neck: Neck supple. No thyromegaly present.  Cardiovascular: Normal rate, regular rhythm and normal heart sounds.  Exam reveals no gallop and no friction rub.   No murmur heard. Pulmonary/Chest: Effort normal and breath sounds normal. No respiratory distress. He has no wheezes.  Abdominal: Soft. He exhibits no distension. There is no tenderness.  Musculoskeletal: He exhibits no edema or tenderness.  Lymphadenopathy:    He has no cervical adenopathy.  Neurological: He is alert.  CN 2-12 intact, no ataxia on finger to nose, no nystagmus, 5/5 strength throughout, no pronator drift, Romberg negative, staggering gait with wide base. Increase problem stopping and turning around. Normal heel to shin. Normal alternate movement.  Skin: Skin is warm and dry.  Psychiatric: He has a normal mood and affect. His behavior is normal. Thought content normal.  Nursing note and vitals reviewed.  Results for orders placed or performed in visit on 12/05/14  POCT urinalysis dipstick  Result Value Ref Range   Color, UA yellow    Clarity, UA clear    Glucose, UA neg    Bilirubin, UA neg    Ketones, UA neg    Spec Grav, UA 1.015    Blood, UA small    pH, UA 6.5    Protein, UA neg    Urobilinogen, UA 1.0    Nitrite, UA neg    Leukocytes, UA Negative   POCT CBC  Result Value Ref Range   WBC 8.2 4.6 - 10.2 K/uL   Lymph, poc 2.2 0.6 - 3.4   POC LYMPH PERCENT 26.3 10 - 50 %L   MID (cbc) 0.5 0 - 0.9   POC MID % 6.2 0 - 12 %M   POC Granulocyte 5.5 2 - 6.9   Granulocyte percent 67.5 37 - 80 %G   RBC 5.32 4.69 - 6.13 M/uL   Hemoglobin 17.7 14.1 - 18.1 g/dL   HCT, POC 52.8 43.5 - 53.7 %   MCV 99.1 (A) 80 -  97 fL   MCH, POC 33.3 (A) 27 - 31.2 pg   MCHC 33.6 31.8 - 35.4 g/dL   RDW, POC 13.8 %   Platelet Count, POC 216 142 - 424 K/uL   MPV 7.8 0 - 99.8 fL  POCT glucose (manual entry)  Result Value Ref Range   POC Glucose 86 70 - 99 mg/dl    Assessment & Plan:   Essential hypertension - Plan: POCT urinalysis dipstick, EKG 12-Lead, POCT CBC, POCT glucose (manual entry), Sedimentation rate, Comprehensive metabolic panel, TSH, Protime-INR, CANCELED: INR  Dizziness - Plan: POCT urinalysis dipstick, EKG 12-Lead, POCT CBC, POCT glucose (manual entry), Sedimentation rate, Comprehensive metabolic panel, TSH, Protime-INR, CT Head Wo Contrast, Ambulatory referral to Neurology, CANCELED: INR  Nausea - Plan: POCT urinalysis dipstick, EKG 12-Lead, POCT CBC, POCT glucose (manual entry), Sedimentation rate, Comprehensive metabolic panel, TSH, Protime-INR, CANCELED: INR  Imbalance - Plan: Protime-INR, CT Head Wo Contrast, Ambulatory referral to Neurology  Ataxia - Plan: Protime-INR, Ambulatory referral to Neurology  Meds ordered this encounter  Medications  . ALPRAZolam (XANAX) 0.25 MG tablet    Sig: Take 1-2 tablets (0.25-0.5 mg total) by mouth 3 (three) times daily as needed for sleep (dizziness).    Dispense:  20 tablet    Refill:  0  . DISCONTD: promethazine (PHENERGAN) 25 MG tablet    Sig: Take 1 tablet (25 mg total) by mouth every 8 (eight) hours as needed for nausea or vomiting.    Dispense:  20 tablet    Refill:  0   Over 40 min spent in face-to-face evaluation of and consultation with patient and coordination of care.  Over 50% of this time was spent counseling this patient.  I personally performed the services described in this documentation, which was scribed in my presence. The recorded information has been reviewed and considered, and addended by me as needed.  Benjamin Cheadle, MD MPH   Ct Head Wo Contrast  12/05/2014   CLINICAL DATA:  Dizziness, onset 3 days ago. Nausea and emesis.  Hypertension and atrial fibrillation  EXAM: CT HEAD WITHOUT CONTRAST  TECHNIQUE: Contiguous axial images were obtained from the base of the skull through the vertex without intravenous contrast.  COMPARISON:  05/29/2010  FINDINGS: Small remote lacunar infarct observed in the left inferior cerebellum, image 5 series 2. Similar small right cerebellar lacunar infarct, image 8 series 2.  The brainstem, cerebral peduncle is, thalami, basal ganglia, ventricular system, and basilar cisterns appear normal.  No intracranial hemorrhage, mass lesion, or acute CVA.  The middle ears and mastoid air cells appear normal bilaterally. The internal auditory canal is appear normal and symmetric. No asymmetry along the cerebellopontine angles.  IMPRESSION: 1. No significant intracranial abnormality to explain the patient's dizziness. 2. Several small remote lacunar infarcts are present in the cerebellum, unchanged from 2011.   Electronically Signed   By: Van Clines M.D.   On: 12/05/2014 21:20

## 2014-12-05 NOTE — Patient Instructions (Signed)
Since you did not respond to the meclizine we will try you on some alprazolam for now.  Get your head CT tonight and I have put in a stat referral to neurology for you.  You can increase the hydroxyzine and ok to continue with promethazine.   If your symptoms worsen at all, please come back in for further evaluation urgently.  Dizziness Dizziness is a common problem. It is a feeling of unsteadiness or light-headedness. You may feel like you are about to faint. Dizziness can lead to injury if you stumble or fall. A person of any age group can suffer from dizziness, but dizziness is more common in older adults. CAUSES  Dizziness can be caused by many different things, including:  Middle ear problems.  Standing for too long.  Infections.  An allergic reaction.  Aging.  An emotional response to something, such as the sight of blood.  Side effects of medicines.  Tiredness.  Problems with circulation or blood pressure.  Excessive use of alcohol or medicines, or illegal drug use.  Breathing too fast (hyperventilation).  An irregular heart rhythm (arrhythmia).  A low red blood cell count (anemia).  Pregnancy.  Vomiting, diarrhea, fever, or other illnesses that cause body fluid loss (dehydration).  Diseases or conditions such as Parkinson's disease, high blood pressure (hypertension), diabetes, and thyroid problems.  Exposure to extreme heat. DIAGNOSIS  Your health care provider will ask about your symptoms, perform a physical exam, and perform an electrocardiogram (ECG) to record the electrical activity of your heart. Your health care provider may also perform other heart or blood tests to determine the cause of your dizziness. These may include:  Transthoracic echocardiogram (TTE). During echocardiography, sound waves are used to evaluate how blood flows through your heart.  Transesophageal echocardiogram (TEE).  Cardiac monitoring. This allows your health care provider to  monitor your heart rate and rhythm in real time.  Holter monitor. This is a portable device that records your heartbeat and can help diagnose heart arrhythmias. It allows your health care provider to track your heart activity for several days if needed.  Stress tests by exercise or by giving medicine that makes the heart beat faster. TREATMENT  Treatment of dizziness depends on the cause of your symptoms and can vary greatly. HOME CARE INSTRUCTIONS   Drink enough fluids to keep your urine clear or pale yellow. This is especially important in very hot weather. In older adults, it is also important in cold weather.  Take your medicine exactly as directed if your dizziness is caused by medicines. When taking blood pressure medicines, it is especially important to get up slowly.  Rise slowly from chairs and steady yourself until you feel okay.  In the morning, first sit up on the side of the bed. When you feel okay, stand slowly while holding onto something until you know your balance is fine.  Move your legs often if you need to stand in one place for a long time. Tighten and relax your muscles in your legs while standing.  Have someone stay with you for 1-2 days if dizziness continues to be a problem. Do this until you feel you are well enough to stay alone. Have the person call your health care provider if he or she notices changes in you that are concerning.  Do not drive or use heavy machinery if you feel dizzy.  Do not drink alcohol. SEEK IMMEDIATE MEDICAL CARE IF:   Your dizziness or light-headedness gets worse.  You feel nauseous or vomit.  You have problems talking, walking, or using your arms, hands, or legs.  You feel weak.  You are not thinking clearly or you have trouble forming sentences. It may take a friend or family member to notice this.  You have chest pain, abdominal pain, shortness of breath, or sweating.  Your vision changes.  You notice any bleeding.  You  have side effects from medicine that seems to be getting worse rather than better. MAKE SURE YOU:   Understand these instructions.  Will watch your condition.  Will get help right away if you are not doing well or get worse. Document Released: 03/26/2001 Document Revised: 10/05/2013 Document Reviewed: 04/19/2011 [facility] Patient Information 2015 [place], [place]. This information is not intended to replace advice given to you by your health care provider. Make sure you discuss any questions you have with your health care provider.

## 2014-12-06 LAB — COMPREHENSIVE METABOLIC PANEL WITH GFR
ALT: 32 U/L (ref 0–53)
AST: 28 U/L (ref 0–37)
Albumin: 4.2 g/dL (ref 3.5–5.2)
Alkaline Phosphatase: 83 U/L (ref 39–117)
BUN: 17 mg/dL (ref 6–23)
CO2: 27 meq/L (ref 19–32)
Calcium: 9.6 mg/dL (ref 8.4–10.5)
Chloride: 98 meq/L (ref 96–112)
Creat: 1.19 mg/dL (ref 0.50–1.35)
Glucose, Bld: 89 mg/dL (ref 70–99)
Potassium: 4.7 meq/L (ref 3.5–5.3)
Sodium: 137 meq/L (ref 135–145)
Total Bilirubin: 1.5 mg/dL — ABNORMAL HIGH (ref 0.2–1.2)
Total Protein: 7.6 g/dL (ref 6.0–8.3)

## 2014-12-06 LAB — PROTIME-INR
INR: 2.53 — AB (ref <1.50)
Prothrombin Time: 27.3 seconds — ABNORMAL HIGH (ref 11.6–15.2)

## 2014-12-06 LAB — SEDIMENTATION RATE: SED RATE: 1 mm/h (ref 0–20)

## 2014-12-06 LAB — TSH: TSH: 1.172 u[IU]/mL (ref 0.350–4.500)

## 2014-12-07 ENCOUNTER — Ambulatory Visit (INDEPENDENT_AMBULATORY_CARE_PROVIDER_SITE_OTHER): Payer: Medicare Other | Admitting: Emergency Medicine

## 2014-12-07 ENCOUNTER — Inpatient Hospital Stay (HOSPITAL_COMMUNITY)
Admission: EM | Admit: 2014-12-07 | Discharge: 2014-12-10 | DRG: 392 | Disposition: A | Discharge status: Home Health | Payer: Medicare Other | Attending: Internal Medicine | Admitting: Internal Medicine

## 2014-12-07 ENCOUNTER — Emergency Department (HOSPITAL_COMMUNITY): Payer: Medicare Other

## 2014-12-07 ENCOUNTER — Other Ambulatory Visit (HOSPITAL_COMMUNITY): Payer: Self-pay

## 2014-12-07 ENCOUNTER — Telehealth: Payer: Self-pay | Admitting: Family Medicine

## 2014-12-07 ENCOUNTER — Telehealth: Payer: Self-pay | Admitting: Cardiovascular Disease

## 2014-12-07 ENCOUNTER — Encounter (HOSPITAL_COMMUNITY): Payer: Self-pay | Admitting: Emergency Medicine

## 2014-12-07 VITALS — BP 100/68 | HR 65 | Temp 97.3°F | Resp 16 | Ht 68.0 in | Wt 221.4 lb

## 2014-12-07 DIAGNOSIS — Z7901 Long term (current) use of anticoagulants: Secondary | ICD-10-CM | POA: Diagnosis not present

## 2014-12-07 DIAGNOSIS — E785 Hyperlipidemia, unspecified: Secondary | ICD-10-CM | POA: Diagnosis present

## 2014-12-07 DIAGNOSIS — I10 Essential (primary) hypertension: Secondary | ICD-10-CM | POA: Diagnosis present

## 2014-12-07 DIAGNOSIS — Z79899 Other long term (current) drug therapy: Secondary | ICD-10-CM

## 2014-12-07 DIAGNOSIS — I5022 Chronic systolic (congestive) heart failure: Secondary | ICD-10-CM | POA: Diagnosis present

## 2014-12-07 DIAGNOSIS — R112 Nausea with vomiting, unspecified: Secondary | ICD-10-CM | POA: Diagnosis present

## 2014-12-07 DIAGNOSIS — Z981 Arthrodesis status: Secondary | ICD-10-CM | POA: Diagnosis not present

## 2014-12-07 DIAGNOSIS — Z833 Family history of diabetes mellitus: Secondary | ICD-10-CM

## 2014-12-07 DIAGNOSIS — R42 Dizziness and giddiness: Secondary | ICD-10-CM | POA: Diagnosis not present

## 2014-12-07 DIAGNOSIS — I442 Atrioventricular block, complete: Secondary | ICD-10-CM | POA: Diagnosis present

## 2014-12-07 DIAGNOSIS — C8599 Non-Hodgkin lymphoma, unspecified, extranodal and solid organ sites: Secondary | ICD-10-CM | POA: Diagnosis present

## 2014-12-07 DIAGNOSIS — Z7982 Long term (current) use of aspirin: Secondary | ICD-10-CM | POA: Diagnosis not present

## 2014-12-07 DIAGNOSIS — I701 Atherosclerosis of renal artery: Secondary | ICD-10-CM | POA: Diagnosis present

## 2014-12-07 DIAGNOSIS — Z8042 Family history of malignant neoplasm of prostate: Secondary | ICD-10-CM

## 2014-12-07 DIAGNOSIS — K529 Noninfective gastroenteritis and colitis, unspecified: Secondary | ICD-10-CM | POA: Diagnosis present

## 2014-12-07 DIAGNOSIS — C8590 Non-Hodgkin lymphoma, unspecified, unspecified site: Secondary | ICD-10-CM | POA: Diagnosis present

## 2014-12-07 DIAGNOSIS — Z8673 Personal history of transient ischemic attack (TIA), and cerebral infarction without residual deficits: Secondary | ICD-10-CM | POA: Diagnosis not present

## 2014-12-07 DIAGNOSIS — I255 Ischemic cardiomyopathy: Secondary | ICD-10-CM | POA: Diagnosis present

## 2014-12-07 DIAGNOSIS — I2510 Atherosclerotic heart disease of native coronary artery without angina pectoris: Secondary | ICD-10-CM | POA: Diagnosis present

## 2014-12-07 DIAGNOSIS — I482 Chronic atrial fibrillation, unspecified: Secondary | ICD-10-CM | POA: Diagnosis present

## 2014-12-07 DIAGNOSIS — Z8249 Family history of ischemic heart disease and other diseases of the circulatory system: Secondary | ICD-10-CM | POA: Diagnosis not present

## 2014-12-07 DIAGNOSIS — Z950 Presence of cardiac pacemaker: Secondary | ICD-10-CM | POA: Diagnosis not present

## 2014-12-07 DIAGNOSIS — H5500 Unspecified nystagmus: Secondary | ICD-10-CM

## 2014-12-07 DIAGNOSIS — E860 Dehydration: Secondary | ICD-10-CM | POA: Diagnosis present

## 2014-12-07 DIAGNOSIS — I6521 Occlusion and stenosis of right carotid artery: Secondary | ICD-10-CM | POA: Diagnosis present

## 2014-12-07 DIAGNOSIS — R2990 Unspecified symptoms and signs involving the nervous system: Secondary | ICD-10-CM

## 2014-12-07 DIAGNOSIS — R2689 Other abnormalities of gait and mobility: Secondary | ICD-10-CM

## 2014-12-07 DIAGNOSIS — E861 Hypovolemia: Secondary | ICD-10-CM | POA: Diagnosis present

## 2014-12-07 DIAGNOSIS — R270 Ataxia, unspecified: Secondary | ICD-10-CM | POA: Diagnosis present

## 2014-12-07 DIAGNOSIS — N179 Acute kidney failure, unspecified: Secondary | ICD-10-CM | POA: Diagnosis present

## 2014-12-07 DIAGNOSIS — R2681 Unsteadiness on feet: Secondary | ICD-10-CM

## 2014-12-07 DIAGNOSIS — Z86010 Personal history of colonic polyps: Secondary | ICD-10-CM

## 2014-12-07 DIAGNOSIS — G459 Transient cerebral ischemic attack, unspecified: Secondary | ICD-10-CM

## 2014-12-07 DIAGNOSIS — I25119 Atherosclerotic heart disease of native coronary artery with unspecified angina pectoris: Secondary | ICD-10-CM | POA: Diagnosis present

## 2014-12-07 LAB — PROTIME-INR
INR: 3.81 — ABNORMAL HIGH (ref 0.00–1.49)
Prothrombin Time: 37.8 seconds — ABNORMAL HIGH (ref 11.6–15.2)

## 2014-12-07 LAB — URINALYSIS, ROUTINE W REFLEX MICROSCOPIC
Bilirubin Urine: NEGATIVE
Glucose, UA: NEGATIVE mg/dL
HGB URINE DIPSTICK: NEGATIVE
KETONES UR: NEGATIVE mg/dL
Leukocytes, UA: NEGATIVE
Nitrite: NEGATIVE
PROTEIN: NEGATIVE mg/dL
Specific Gravity, Urine: 1.009 (ref 1.005–1.030)
Urobilinogen, UA: 0.2 mg/dL (ref 0.0–1.0)
pH: 5 (ref 5.0–8.0)

## 2014-12-07 LAB — CBC
HEMATOCRIT: 48.6 % (ref 39.0–52.0)
Hemoglobin: 17.2 g/dL — ABNORMAL HIGH (ref 13.0–17.0)
MCH: 33.8 pg (ref 26.0–34.0)
MCHC: 35.4 g/dL (ref 30.0–36.0)
MCV: 95.5 fL (ref 78.0–100.0)
PLATELETS: 198 10*3/uL (ref 150–400)
RBC: 5.09 MIL/uL (ref 4.22–5.81)
RDW: 13 % (ref 11.5–15.5)
WBC: 10.5 10*3/uL (ref 4.0–10.5)

## 2014-12-07 LAB — TROPONIN I: Troponin I: <0.03 ng/mL (ref <0.031)

## 2014-12-07 LAB — BASIC METABOLIC PANEL
Anion gap: 12 (ref 5–15)
BUN: 36 mg/dL — ABNORMAL HIGH (ref 6–23)
CALCIUM: 8.9 mg/dL (ref 8.4–10.5)
CO2: 23 mmol/L (ref 19–32)
CREATININE: 3.37 mg/dL — AB (ref 0.50–1.35)
Chloride: 100 mmol/L (ref 96–112)
GFR calc Af Amer: 20 mL/min — ABNORMAL LOW (ref >90)
GFR calc non Af Amer: 17 mL/min — ABNORMAL LOW (ref >90)
Glucose, Bld: 106 mg/dL — ABNORMAL HIGH (ref 70–99)
POTASSIUM: 3.8 mmol/L (ref 3.5–5.1)
SODIUM: 135 mmol/L (ref 135–145)

## 2014-12-07 MED ORDER — STROKE: EARLY STAGES OF RECOVERY BOOK
Freq: Once | Status: AC
  Administered 2014-12-08: 06:00:00
  Filled 2014-12-07: qty 1

## 2014-12-07 MED ORDER — PRAVASTATIN SODIUM 40 MG PO TABS
80.0000 mg | ORAL_TABLET | Freq: Every day | ORAL | Status: DC
  Administered 2014-12-07 – 2014-12-09 (×3): 80 mg via ORAL
  Filled 2014-12-07 (×4): qty 2

## 2014-12-07 MED ORDER — WARFARIN - PHARMACIST DOSING INPATIENT: Freq: Every day | Status: DC

## 2014-12-07 MED ORDER — METOPROLOL TARTRATE 50 MG PO TABS
75.0000 mg | ORAL_TABLET | Freq: Two times a day (BID) | ORAL | Status: DC
  Administered 2014-12-07: 75 mg via ORAL
  Filled 2014-12-07 (×2): qty 1

## 2014-12-07 MED ORDER — MECLIZINE HCL 25 MG PO TABS
25.0000 mg | ORAL_TABLET | Freq: Once | ORAL | Status: AC
  Administered 2014-12-07: 25 mg via ORAL
  Filled 2014-12-07: qty 1

## 2014-12-07 MED ORDER — PANTOPRAZOLE SODIUM 40 MG PO TBEC
40.0000 mg | DELAYED_RELEASE_TABLET | Freq: Every day | ORAL | Status: DC
  Administered 2014-12-07 – 2014-12-10 (×4): 40 mg via ORAL
  Filled 2014-12-07 (×4): qty 1

## 2014-12-07 MED ORDER — SODIUM CHLORIDE 0.9 % IV SOLN
INTRAVENOUS | Status: AC
  Administered 2014-12-07: 23:00:00 via INTRAVENOUS

## 2014-12-07 MED ORDER — AMIODARONE HCL 200 MG PO TABS
200.0000 mg | ORAL_TABLET | Freq: Every day | ORAL | Status: DC
  Administered 2014-12-07 – 2014-12-10 (×3): 200 mg via ORAL
  Filled 2014-12-07 (×3): qty 1

## 2014-12-07 MED ORDER — ACETAMINOPHEN 325 MG PO TABS: 650.0000 mg | ORAL_TABLET | ORAL | Status: DC | PRN

## 2014-12-07 MED ORDER — TAMSULOSIN HCL 0.4 MG PO CAPS
0.4000 mg | ORAL_CAPSULE | Freq: Every day | ORAL | Status: DC
  Administered 2014-12-07 – 2014-12-08 (×2): 0.4 mg via ORAL
  Filled 2014-12-07 (×2): qty 1

## 2014-12-07 MED ORDER — ALPRAZOLAM 0.25 MG PO TABS
0.2500 mg | ORAL_TABLET | Freq: Three times a day (TID) | ORAL | Status: DC | PRN
  Administered 2014-12-08: 0.5 mg via ORAL
  Administered 2014-12-08: 0.25 mg via ORAL
  Administered 2014-12-09: 0.5 mg via ORAL
  Filled 2014-12-07 (×3): qty 2

## 2014-12-07 MED ORDER — ASPIRIN 81 MG PO CHEW
81.0000 mg | CHEWABLE_TABLET | Freq: Every day | ORAL | Status: DC
  Administered 2014-12-08 – 2014-12-10 (×3): 81 mg via ORAL
  Filled 2014-12-07 (×3): qty 1

## 2014-12-07 NOTE — ED Provider Notes (Signed)
CSN: [id number]     Arrival date & time 12/07/14  1638 History   First MD Initiated Contact with Patient 12/07/14 1643     Chief Complaint  Patient presents with  . Dizziness     (Consider location/radiation/quality/duration/timing/severity/associated sxs/prior Treatment) HPI Comments: Ataxic for 5 days. Seen here 2 days ago, had negative head CT.  Patient is a 69 y.o. male presenting with dizziness. The history is provided by the patient.  Dizziness Quality:  Lightheadedness Severity:  Moderate Onset quality:  Gradual Duration:  5 days Timing:  Constant Progression:  Unchanged Chronicity:  New Context: physical activity and standing up   Relieved by:  Nothing Worsened by:  Nothing Associated symptoms: no blood in stool and no shortness of breath     Past Medical History  Diagnosis Date  . Atrial fibrillation   . Lymphoma 1998    of colon   . Diverticulosis   . Esophageal stricture   . Hyperlipidemia   . Hypertension   . CVA (cerebral infarction)   . Colon polyps   . Urinary frequency   . Umbilical hernia   . Congenital heart block   . CHB (complete heart block) 11/06/2013  . Cardiomyopathy, ischemic 11/07/2013   Past Surgical History  Procedure Laterality Date  . Appendectomy  1953  . Colectomy      for lymphoma  . Pacemaker placement      replaced 3 x  . Tonsillectomy    . Neck fusion    . Back surgery    . Carotid doppler  11/04/2012    Proximal Rt ICA 50-99% diameter reduction; Lft Bulb demonstrated mild amount homogeneous plaque-not hemodynamically significant; Lft ICA-normal patency.  . Pacemaker generator change  01/31/2012    [employer] DR RF model [id number] serial [id number]  . Cardiac catheterization  01/06/2003    Recommend medical therapy  . Cardiovascular stress test  07/16/2012    Mild-moderate perfusion defect seen in Basal inferior, Mid inferior, and Apica lateral consistent with infarct/scar. No scintigraphic evidence for inducible myocardial  ischemia. No ECG changes. EKG negative for ischemia.  . Transthoracic echocardiogram  11/04/2012    EF 20-80%, systolic function moderately reduced, mild regurg of the aortic and mitral valves.  . Permanent pacemaker generator change N/A 01/31/2012    Procedure: PERMANENT PACEMAKER GENERATOR CHANGE;  Surgeon: Sanda Klein, MD;  Location: [facility];  Service: Cardiovascular;  Laterality: N/A;   Family History  Problem Relation Age of Onset  . Prostate cancer Father   . Colon cancer Neg Hx   . Diabetes Maternal Grandmother   . Heart disease Mother    History  Substance Use Topics  . Smoking status: Never Smoker   . Smokeless tobacco: Not on file  . Alcohol Use: No    Review of Systems  Constitutional: Negative for fever.  Respiratory: Negative for cough and shortness of breath.   Gastrointestinal: Negative for blood in stool.  Neurological: Positive for dizziness.  All other systems reviewed and are negative.     Allergies  Statins and Penicillins  Home Medications   Prior to Admission medications   Medication Sig Start Date End Date Taking? Authorizing Provider  ALPRAZolam (XANAX) 0.25 MG tablet Take 1-2 tablets (0.25-0.5 mg total) by mouth 3 (three) times daily as needed for sleep (dizziness). Patient taking differently: Take 0.25-0.5 mg by mouth at bedtime as needed for sleep (dizziness).  12/05/14  Yes Shawnee Knapp, MD  amiodarone (PACERONE) 200 MG tablet  Take 1 tablet (200 mg total) by mouth daily. 05/17/14  Yes Mihai Croitoru, MD  aspirin 81 MG tablet Take 81 mg by mouth daily.     Yes Historical Provider, MD  losartan (COZAAR) 100 MG tablet Take 1 tablet (100 mg total) by mouth daily. 05/17/14  Yes Mihai Croitoru, MD  metoprolol (LOPRESSOR) 50 MG tablet Take 1.5 tablets (75 mg total) by mouth 2 (two) times daily. 11/16/14  Yes Mihai Croitoru, MD  pravastatin (PRAVACHOL) 80 MG tablet Take 1 tablet (80 mg total) by mouth daily. 10/18/14  Yes Mihai Croitoru, MD  promethazine  (PHENERGAN) 25 MG tablet Take 1 tablet (25 mg total) by mouth every 8 (eight) hours as needed for nausea or vomiting. 12/05/14  Yes Shawnee Knapp, MD  Tamsulosin HCl (FLOMAX) 0.4 MG CAPS Take 0.4 mg by mouth daily.    Yes Historical Provider, MD  triamterene-hydrochlorothiazide (MAXZIDE-25) 37.5-25 MG per tablet Take 1 tablet by mouth See admin instructions. Pt takes Monday through Friday. 05/17/14  Yes Mihai Croitoru, MD  warfarin (COUMADIN) 2.5 MG tablet Take 2.5 mg by mouth daily at 6 PM.   Yes Historical Provider, MD  hydrOXYzine (ATARAX/VISTARIL) 10 MG tablet Take 10 mg by mouth 3 (three) times daily as needed.    Historical Provider, MD  pantoprazole (PROTONIX) 40 MG tablet Take 1 tablet (40 mg total) by mouth daily. Patient not taking: Reported on 12/07/2014 05/17/14   Sanda Klein, MD  warfarin (COUMADIN) 5 MG tablet Take 1-2.5 tablets (5-12.5 mg total) by mouth daily. Pt takes 1 tab on tue,wed,thu,sat,and sun. And takes 2 & 1/2 tabs on Mon, and Fri Patient not taking: Reported on 12/07/2014 05/17/14   Mihai Croitoru, MD   BP 125/74 mmHg  Pulse 68  Temp(Src) 97.8 F (36.6 C) (Oral)  Resp 16  Ht 5\' 9"  (1.753 m)  Wt 218 lb 3.2 oz (98.975 kg)  BMI 32.21 kg/m2  SpO2 98% Physical Exam  Constitutional: He is oriented to person, place, and time. He appears well-developed and well-nourished. No distress.  HENT:  Head: Normocephalic and atraumatic.  Mouth/Throat: Oropharynx is clear and moist. No oropharyngeal exudate.  Eyes: EOM are normal. Pupils are equal, round, and reactive to light.  Neck: Normal range of motion. Neck supple.  Cardiovascular: Normal rate and regular rhythm.  Exam reveals no friction rub.   No murmur heard. Pulmonary/Chest: Effort normal and breath sounds normal. No respiratory distress. He has no wheezes. He has no rales.  Abdominal: Soft. He exhibits no distension. There is no tenderness. There is no rebound.  Musculoskeletal: Normal range of motion. He exhibits no edema.   Neurological: He is alert and oriented to person, place, and time. No cranial nerve deficit. He exhibits normal muscle tone. Coordination and gait normal.  Neutral Babinski reflex here Normal Romberg  Skin: No rash noted. He is not diaphoretic.  Nursing note and vitals reviewed.   ED Course  Procedures (including critical care time) Labs Review Labs Reviewed  CBC - Abnormal; Notable for the following:    Hemoglobin 17.2 (*)    All other components within normal limits  BASIC METABOLIC PANEL - Abnormal; Notable for the following:    Glucose, Bld 106 (*)    BUN 36 (*)    Creatinine, Ser 3.37 (*)    GFR calc non Af Amer 17 (*)    GFR calc Af Amer 20 (*)    All other components within normal limits  URINALYSIS, ROUTINE W REFLEX MICROSCOPIC -  Abnormal; Notable for the following:    APPearance CLOUDY (*)    All other components within normal limits  PROTIME-INR - Abnormal; Notable for the following:    Prothrombin Time 37.8 (*)    INR 3.81 (*)    All other components within normal limits  TROPONIN I  TROPONIN I  TROPONIN I  HEMOGLOBIN A1C  LIPID PANEL  CREATININE, URINE, RANDOM  SODIUM, URINE, RANDOM  PROTIME-INR  I-STAT TROPOININ, ED    Imaging Review Dg Chest 2 View  12/07/2014   CLINICAL DATA:  Dizziness for 4 days. History of atrial fibrillation.  EXAM: CHEST  2 VIEW  COMPARISON:  02/07/2012  FINDINGS: Left subclavian approach 3 lead pacemaker remains in place, with a fractured lead again noted. The cardiac silhouette remains mildly enlarged. Increased density in the right lung base is unchanged corresponding to mildly prominent pulmonary vessels and superimposed costochondral calcification. The lungs are well inflated without evidence of airspace consolidation, edema, pleural effusion, or pneumothorax. No acute osseous abnormality is identified.  IMPRESSION: No active cardiopulmonary disease.   Electronically Signed   By: Logan Bores   On: 12/07/2014 17:36   Ct Head Wo  Contrast  12/07/2014   CLINICAL DATA:  Dizziness beginning Saturday. Went to urgent care on Monday. CT was performed and reportedly negative. Patient returned urgent care today for followup. Dizziness continued. Patient was sent tumor is to department.  EXAM: CT HEAD WITHOUT CONTRAST  TECHNIQUE: Contiguous axial images were obtained from the base of the skull through the vertex without intravenous contrast.  COMPARISON:  12/05/2014  FINDINGS: Mild cerebral atrophy. No ventricular dilatation. Mild white matter changes suggesting small vessel ischemia. Old lacunar infarct in the cerebellar hemispheres bilaterally. No change since previous study. No mass effect or midline shift. No abnormal extra-axial fluid collections. Gray-white matter junctions are distinct. Basal cisterns are not effaced. No evidence of acute intracranial hemorrhage. No depressed skull fractures. Visualized paranasal sinuses and mastoid air cells are not opacified.  IMPRESSION: No acute intracranial abnormalities. Mild chronic atrophy and small vessel ischemic changes   Electronically Signed   By: Lucienne Capers M.D.   On: 12/07/2014 17:30     EKG Interpretation None      Date: 12/07/2014  Rate: 65  Rhythm: normal sinus rhythm  QRS Axis: left  Intervals: normal  ST/T Wave abnormalities: normal  Conduction Disutrbances:none  Narrative Interpretation:   Old EKG Reviewed: unchanged   MDM   Final diagnoses:  Dizziness  Acute renal failure, unspecified acute renal failure type  Dehydration    69 year old male here with 5 days of dizziness. Was seen here 2 days ago, Head CT shows remote cerebellar infarct, unchanged. Persistent dizziness since with ataxia and difficulty walking. Had N/V 3 days ago, none since. AFVSS here. Normal neurologic exam for me, normal Romberg, normal gait, normal heel-to-shin, normal finger-to-nose. Dizziness described as lightheaded. Neutral Babinski for me, was reported as upgoing bilaterally  with Urgent Care. Urgent Care requested Neuro eval in their note, I feel this is appropriate.   Patient evaluated by neurology. Dr. Janann Colonel feels patient could be admitted versus going home. He has follow-up with neurology in 5 days. Patient cannot get MRI due to his pacemaker. He is already on Coumadin so does not need aspirin or Plavix. Patient offered admission for PT eval and to expedite workup with echo, carotid Dopplers, possible CTA head and neck, but patient would rather go home.  Labs returned and show ARF from 2 days ago.  Plan for admission for dehydration.  Evelina Bucy, MD 12/07/14 2325

## 2014-12-07 NOTE — ED Notes (Signed)
Neuro DO at bedside.

## 2014-12-07 NOTE — ED Notes (Signed)
Bed: RESA Expected date:  Expected time:  Means of arrival:  Comments: EMS- 69yo M, dizziness

## 2014-12-07 NOTE — Consult Note (Signed)
Consult Reason for Benjamin Mckay Referring Physician: Dr Mingo Amber  CC: gait instability  HPI: Benjamin Mckay is an 69 y.o. male with history of CAD, HLD, HTN, CVA, and atrial fibrillation who presents with 4-5 day history of gait instability. Notes sudden onset, described as "side to side" instability. Denies vertigo or sensation of movement. Feels more off balanced. Denies feeling like he is going to pass out.  Notes symptoms are worse with standing, improves with sitting down. Can be triggered by turning his head to the right. Improves with closing his eyes. Has had associated nausea and emesis. No associated palpitations, diaphoresis. No change in speech or vision. No difficulty swallowing.   Prior CVA 14 years ago. Has a PPM. Head CT in ED reviewed, no acute process. Takes coumadin for his A fib. INR 2.53 on 2/22.  Of note he has a carotid duplex from 2014 showing "50-99%" stenosis of R ICA. Prior carotid study from 2008 shows 70% stenosis of right ICA.   Past Medical History  Diagnosis Date  . Atrial fibrillation   . Lymphoma 1998    of colon   . Diverticulosis   . Esophageal stricture   . Hyperlipidemia   . Hypertension   . CVA (cerebral infarction)   . Colon polyps   . Urinary frequency   . Umbilical hernia   . Congenital heart block   . CHB (complete heart block) 11/06/2013  . Cardiomyopathy, ischemic 11/07/2013    Past Surgical History  Procedure Laterality Date  . Appendectomy  1953  . Colectomy      for lymphoma  . Pacemaker placement      replaced 3 x  . Tonsillectomy    . Neck fusion    . Back surgery    . Carotid doppler  11/04/2012    Proximal Rt ICA 50-99% diameter reduction; Lft Bulb demonstrated mild amount homogeneous plaque-not hemodynamically significant; Lft ICA-normal patency.  . Pacemaker generator change  01/31/2012    St Jude Med Acc[employer] DR RF model [id number] serial [id number]  . Cardiac catheterization  01/06/2003    Recommend medical therapy  .  Cardiovascular stress test  07/16/2012    Mild-moderate perfusion defect seen in Basal inferior, Mid inferior, and Apica lateral consistent with infarct/scar. No scintigraphic evidence for inducible myocardial ischemia. No ECG changes. EKG negative for ischemia.  . Transthoracic echocardiogram  11/04/2012    EF 12-45%, systolic function moderately reduced, mild regurg of the aortic and mitral valves.  . Permanent pacemaker generator change N/A 01/31/2012    Procedure: PERMANENT PACEMAKER GENERATOR CHANGE;  Surgeon: Sanda Klein, MD;  Location: [facility];  Service: Cardiovascular;  Laterality: N/A;    Family History  Problem Relation Age of Onset  . Prostate cancer Father   . Colon cancer Neg Hx   . Diabetes Maternal Grandmother   . Heart disease Mother     Social History:  reports that he has never smoked. He does not have any smokeless tobacco history on file. He reports that he does not drink alcohol or use illicit drugs.  Allergies  Allergen Reactions  . Statins     Leg cramps.   Tolerates pravastatin.    Marland Kitchen Penicillins Rash    Medications: Scheduled:   ROS: Out of a complete 14 system review, the patient complains of only the following symptoms, and all other reviewed systems are negative. + gait instability Physical Examination: Filed Vitals:   12/07/14 1641  BP: 145/87  Pulse: 70  Temp: 97.7  F (36.5 C)  Resp: 12  Orthostatic vitals: Flat: BP 135/67 P 67 Standing: BP 126/81 P 68  Physical Exam  Constitutional: He appears well-developed and well-nourished.  Psych: Affect appropriate to situation Eyes: No scleral injection HENT: No OP obstrucion Head: Normocephalic.  Cardiovascular: Normal rate and regular rhythm.  Respiratory: Effort normal and breath sounds normal.  GI: Soft. Bowel sounds are normal. No distension. There is no tenderness.  Skin: WDI  Neurologic Examination Mental Status: Alert, oriented, thought content appropriate.  Speech fluent  without evidence of aphasia. No dysarthria. Able to follow 3 step commands without difficulty. Cranial Nerves: II: funduscopic exam wnl bilaterally, visual fields grossly normal, pupils equal, round, reactive to light and accommodation III,IV, VI: ptosis not present, extra-ocular motions intact bilaterally, few non-sustained beats nystagmus with left lateral gaze V,VII: smile symmetric, facial light touch sensation normal bilaterally VIII: hearing normal bilaterally IX,X: gag reflex present XI: trapezius strength/neck flexion strength normal bilaterally XII: tongue strength normal  Motor: Right : Upper extremity    Left:     Upper extremity 5/5 deltoid       5/5 deltoid 5/5 biceps      5/5 biceps  5/5 triceps      5/5 triceps 5/5 hand grip      5/5 hand grip  Lower extremity     Lower extremity 5/5 hip flexor      5/5 hip flexor 5/5 quadricep      5/5 quadriceps  5/5 hamstrings     5/5 hamstrings 5/5 plantar flexion       5/5 plantar flexion 5/5 plantar extension     5/5 plantar extension Tone and bulk:normal tone throughout; no atrophy noted Sensory: Pinprick and light touch intact throughout, bilaterally. Proprioception intact Deep Tendon Reflexes: 2+ and symmetric throughout Plantars: Right: downgoing   Left: mute Cerebellar: normal finger-to-nose,  and normal heel-to-shin test Gait: stands without assistance, slightly wide based gait. Turns without difficulty. Negative Romberg   Laboratory Studies:   Basic Metabolic Panel:  Recent Labs Lab 12/01/14 1049 12/05/14 1929  NA 138 137  K 4.4 4.7  CL 104 98  CO2 26 27  GLUCOSE 96 89  BUN 13 17  CREATININE 1.13 1.19  CALCIUM 8.6 9.6    Liver Function Tests:  Recent Labs Lab 12/01/14 1049 12/05/14 1929  AST 22 28  ALT 25 32  ALKPHOS 78 83  BILITOT 1.1 1.5*  PROT 6.4 7.6  ALBUMIN 3.7 4.2   No results for input(s): LIPASE, AMYLASE in the last 168 hours. No results for input(s): AMMONIA in the last 168  hours.  CBC:  Recent Labs Lab 12/01/14 1049 12/05/14 1929  WBC 5.7 8.2  HGB 15.1 17.7  HCT 45.4 52.8  MCV 98.9 99.1*  PLT 199  --     Cardiac Enzymes: No results for input(s): CKTOTAL, CKMB, CKMBINDEX, TROPONINI in the last 168 hours.  BNP: Invalid input(s): POCBNP  CBG: No results for input(s): GLUCAP in the last 168 hours.  Microbiology: Results for orders placed or performed during the hospital encounter of 01/31/12  Surgical pcr screen     Status: None   Collection Time: 01/31/12  7:44 AM  Result Value Ref Range Status   MRSA, PCR NEGATIVE NEGATIVE Final   Staphylococcus aureus NEGATIVE NEGATIVE Final    Comment:        The Xpert SA Assay (FDA approved for NASAL specimens only), is one component of a comprehensive surveillance program.  It is not  intended to diagnose infection nor to guide or monitor treatment.    Coagulation Studies:  Recent Labs  12/05/14 1929  LABPROT 27.3*  INR 2.53*    Urinalysis:  Recent Labs Lab 12/05/14 1914  BILIRUBINUR neg  PROTEINUR neg  UROBILINOGEN 1.0  NITRITE neg  LEUKOCYTESUR Negative    Lipid Panel:     Component Value Date/Time   CHOL 158 12/01/2014 1049   TRIG 91 12/01/2014 1049   HDL 35* 12/01/2014 1049   CHOLHDL 4.5 12/01/2014 1049   VLDL 18 12/01/2014 1049   LDLCALC 105* 12/01/2014 1049    HgbA1C: No results found for: HGBA1C  Urine Drug Screen:  No results found for: LABOPIA, COCAINSCRNUR, LABBENZ, AMPHETMU, THCU, LABBARB  Alcohol Level: No results for input(s): ETH in the last 168 hours.  Other results:  Imaging: Ct Head Wo Contrast  12/07/2014   CLINICAL DATA:  Dizziness beginning Saturday. Went to urgent care on Monday. CT was performed and reportedly negative. Patient returned urgent care today for followup. Dizziness continued. Patient was sent tumor is to department.  EXAM: CT HEAD WITHOUT CONTRAST  TECHNIQUE: Contiguous axial images were obtained from the base of the skull through the  vertex without intravenous contrast.  COMPARISON:  12/05/2014  FINDINGS: Mild cerebral atrophy. No ventricular dilatation. Mild white matter changes suggesting small vessel ischemia. Old lacunar infarct in the cerebellar hemispheres bilaterally. No change since previous study. No mass effect or midline shift. No abnormal extra-axial fluid collections. Gray-white matter junctions are distinct. Basal cisterns are not effaced. No evidence of acute intracranial hemorrhage. No depressed skull fractures. Visualized paranasal sinuses and mastoid air cells are not opacified.  IMPRESSION: No acute intracranial abnormalities. Mild chronic atrophy and small vessel ischemic changes   Electronically Signed   By: Lucienne Capers M.D.   On: 12/07/2014 17:30   Ct Head Wo Contrast  12/05/2014   CLINICAL DATA:  Dizziness, onset 3 days ago. Nausea and emesis. Hypertension and atrial fibrillation  EXAM: CT HEAD WITHOUT CONTRAST  TECHNIQUE: Contiguous axial images were obtained from the base of the skull through the vertex without intravenous contrast.  COMPARISON:  05/29/2010  FINDINGS: Small remote lacunar infarct observed in the left inferior cerebellum, image 5 series 2. Similar small right cerebellar lacunar infarct, image 8 series 2.  The brainstem, cerebral peduncle is, thalami, basal ganglia, ventricular system, and basilar cisterns appear normal.  No intracranial hemorrhage, mass lesion, or acute CVA.  The middle ears and mastoid air cells appear normal bilaterally. The internal auditory canal is appear normal and symmetric. No asymmetry along the cerebellopontine angles.  IMPRESSION: 1. No significant intracranial abnormality to explain the patient's dizziness. 2. Several small remote lacunar infarcts are present in the cerebellum, unchanged from 2011.   Electronically Signed   By: Van Clines M.D.   On: 12/05/2014 21:20     Assessment/Plan:  68y/o gentleman history of A fib (on coumadin), prior CVA, HTN  presenting with 5 day history of gait instability. Gait is slightly wide based but neurological exam is overall non-focal. CT head imaging shows no acute infarct. Differential includes small posterior circulation infarct vs possible peripheral vestibular dysfunction. Patient is unfortunately unable to get MRI due to PPM.   Discussed option of admission with stroke workup vs outpatient follow up. Patient and spouse do not wish to be admitted. Counseled them on risks of discharge and instructed them to return to ED immediately if symptoms worsen. They express understanding and agreement. They already have outpatient  follow up with Dr Rexene Alberts of [place] on 12/13/2014. Continue coumadin. Will need PT referral and recheck of carotid arteries as outpatient.   Jim Like, DO [facility] [phone]  If 7pm- 7am, please page neurology on call as listed in [place]. 12/07/2014, 5:36 PM

## 2014-12-07 NOTE — Progress Notes (Addendum)
   This chart was scribed for Darlyne Russian, MD by Edison Simon, ED Scribe. This patient was seen in room 6 and the patient's care was started at 3:20 PM.   Subjective:    Patient ID: Benjamin Mckay, male    DOB: 03-06-1946, 69 y.o.   MRN: [id number]  HPI  HPI Comments: Benjamin Mckay is a 69 y.o. male with history of CAD, HLD, HTN, CVA, and atrial fibrillationwho presents to the [facility] complaining of "side-to-side" dizziness with sudden onset while at his child's sports game 4 days ago. He states it is worse when standing up. He reports associated chills, diaphoresis, and vomiting. He denies room spinning. He states he has difficulty walking, though he normally has a steady gait. He states he sometimes feel like he cannot locate the floor when he is already on it or the floor is higher than he thinks. He states it does not affect one side more than the other and he denies weakness being greater on one side. He states he has been urinating normally until today; he states he did not have any urine this morning and has gone only a little since then. Wife notes that he seemed to be raising his left leg higher than typical when he walking today. He has a pacemaker. Per wife, his last CVA was 14 years ago. He denies chest pain, neck stiffness, or falling.   Review of Systems  Constitutional: Positive for chills and diaphoresis.  Cardiovascular: Negative for chest pain.  Gastrointestinal: Positive for vomiting.  Genitourinary: Positive for decreased urine volume.  Musculoskeletal: Positive for gait problem. Negative for neck stiffness.  Neurological: Positive for dizziness.  All other systems reviewed and are negative.      Objective:   Physical Exam  Nursing note and vitals reviewed.   CONSTITUTIONAL: Well developed/well nourished, alert, cooperative HEAD: Normocephalic/atraumatic EYES: Very small pupils, poorly reactive, nystagmus on lateral gaze both eyes ENMT:  Mucous membranes moist NECK: supple no meningeal signs SPINE/BACK:entire spine nontender CV: S1/S2 noted, no murmurs/rubs/gallops noted LUNGS: Lungs are clear to auscultation bilaterally, no apparent distress ABDOMEN: soft, nontender, no rebound or guarding, bowel sounds noted throughout abdomen GU:no cva tenderness NEURO: Pt is awake/alert/appropriate, moves all extremitiesx4.  No facial droop.  , his gait is unsteady and needs 2 people for support, no focal weakness of upper or lower extremities, He has bilateral upgoing toes EXTREMITIES: pulses normal/equal, full ROM SKIN: warm, color normal PSYCH: no abnormalities of mood noted, alert and oriented to situation      Assessment & Plan:   Patient had a previous CT on Monday which showed evidence of previous cerebellar lacunar infarcts. Patient's blood work did not give an etiology for his dizziness. On exam today he has nystagmus and unsteady gait and bilateral upgoing toes. I'm very suspicious of a recurrent problem in the cerebellum or possibly the brainstem. He does have a pacemaker and so he cannot have an MRI. IV fluids will be started since patient has not been able to drink. He will be transported to [facility] for further evaluation.I personally performed the services described in this documentation, which was scribed in my presence. The recorded information has been reviewed and is accurate.

## 2014-12-07 NOTE — Telephone Encounter (Addendum)
Patient's wife called at 8:00am and states that her husband's condition has worsened. Patient states that he was seen on 12/05/2014 by Dr. Brigitte Pulse for dizziness. Since that time the patient's dizziness has become worse. He has loss of balance and his cognitive functions have diminished (wife states that yesterday evening he was unable to write a check). Wife states that she is afraid to leave him alone at home. What should they do to treat the situation until patient is able to go to his neurology appointment next week? I attempted to get a medical assistant to help with phone call but was unsuccessful in doing so.  Advised patient's wife that he should RTC or go to ED if his condition becomes too unstable. Please call Sherrill at [phone]. She states that she is currently at work however if she does not answer keep calling. Do not leave a VM.   Thanks,  [employer]

## 2014-12-07 NOTE — H&P (Signed)
PCP: [facility]  Chief Complaint:  Nausea and vomiting with vertigo  HPI: Benjamin Mckay is a 69 y.o. male   has a past medical history of Atrial fibrillation; Lymphoma (1998); Diverticulosis; Esophageal stricture; Hyperlipidemia; Hypertension; CVA (cerebral infarction); Colon polyps; Urinary frequency; Umbilical hernia; Congenital heart block; CHB (complete heart block) (11/06/2013); and Cardiomyopathy, ischemic (11/07/2013).   Presented with  Since Saturday AM patient developed ataxia  worse when tried to ambulate, He vae had two episodes of vomiting, no headache. No localized weakness, no slurred speech. Patient was worried about nausea and discontinued eating or drinking for past 4 days. Only had some Gatorade today. Neurology seen him and recommended CVA work up as outpatient. He is unanble to get MRI as he is sp pacemaker for complete heart block.  Routine labs showed ARF Hospitalist was called for admission. Patient on chronic coumadin with hx of A.fib. Echo from 2014 showing EF of 35-40%   Review of Systems:    Pertinent positives include: Ataxia nausea, vomiting  Constitutional:  No weight loss, night sweats, Fevers, chills, fatigue, weight loss  HEENT:  No headaches, Difficulty swallowing,Tooth/dental problems,Sore throat,  No sneezing, itching, ear ache, nasal congestion, post nasal drip,  Cardio-vascular:  No chest pain, Orthopnea, PND, anasarca, dizziness, palpitations.no Bilateral lower extremity swelling  GI:  No heartburn, indigestion, abdominal pain,, diarrhea, change in bowel habits, loss of appetite, melena, blood in stool, hematemesis Resp:  no shortness of breath at rest. No dyspnea on exertion, No excess mucus, no productive cough, No non-productive cough, No coughing up of blood.No change in color of mucus.No wheezing. Skin:  no rash or lesions. No jaundice GU:  no dysuria, change in color of urine, no urgency or frequency. No straining to urinate.  No  flank pain.  Musculoskeletal:  No joint pain or no joint swelling. No decreased range of motion. No back pain.  Psych:  No change in mood or affect. No depression or anxiety. No memory loss.  Neuro: no localizing neurological complaints, no tingling, no weakness, no double vision, no gait abnormality, no slurred speech, no confusion  Otherwise ROS are negative except for above, 10 systems were reviewed  Past Medical History: Past Medical History  Diagnosis Date  . Atrial fibrillation   . Lymphoma 1998    of colon   . Diverticulosis   . Esophageal stricture   . Hyperlipidemia   . Hypertension   . CVA (cerebral infarction)   . Colon polyps   . Urinary frequency   . Umbilical hernia   . Congenital heart block   . CHB (complete heart block) 11/06/2013  . Cardiomyopathy, ischemic 11/07/2013   Past Surgical History  Procedure Laterality Date  . Appendectomy  1953  . Colectomy      for lymphoma  . Pacemaker placement      replaced 3 x  . Tonsillectomy    . Neck fusion    . Back surgery    . Carotid doppler  11/04/2012    Proximal Rt ICA 50-99% diameter reduction; Lft Bulb demonstrated mild amount homogeneous plaque-not hemodynamically significant; Lft ICA-normal patency.  . Pacemaker generator change  01/31/2012    St Jude Med [employer] DR RF model [id number] serial [phone]  . Cardiac catheterization  01/06/2003    Recommend medical therapy  . Cardiovascular stress test  07/16/2012    Mild-moderate perfusion defect seen in Basal inferior, Mid inferior, and Apica lateral consistent with infarct/scar. No scintigraphic evidence for inducible myocardial ischemia.  No ECG changes. EKG negative for ischemia.  . Transthoracic echocardiogram  11/04/2012    EF 93-57%, systolic function moderately reduced, mild regurg of the aortic and mitral valves.  . Permanent pacemaker generator change N/A 01/31/2012    Procedure: PERMANENT PACEMAKER GENERATOR CHANGE;  Surgeon: Sanda Klein, MD;  Location: [facility];  Service: Cardiovascular;  Laterality: N/A;     Medications: Prior to Admission medications   Medication Sig Start Date End Date Taking? Authorizing Provider  ALPRAZolam (XANAX) 0.25 MG tablet Take 1-2 tablets (0.25-0.5 mg total) by mouth 3 (three) times daily as needed for sleep (dizziness). Patient taking differently: Take 0.25-0.5 mg by mouth at bedtime as needed for sleep (dizziness).  12/05/14  Yes Shawnee Knapp, MD  amiodarone (PACERONE) 200 MG tablet Take 1 tablet (200 mg total) by mouth daily. 05/17/14  Yes Mihai Croitoru, MD  aspirin 81 MG tablet Take 81 mg by mouth daily.     Yes Historical Provider, MD  losartan (COZAAR) 100 MG tablet Take 1 tablet (100 mg total) by mouth daily. 05/17/14  Yes Mihai Croitoru, MD  metoprolol (LOPRESSOR) 50 MG tablet Take 1.5 tablets (75 mg total) by mouth 2 (two) times daily. 11/16/14  Yes Mihai Croitoru, MD  pravastatin (PRAVACHOL) 80 MG tablet Take 1 tablet (80 mg total) by mouth daily. 10/18/14  Yes Mihai Croitoru, MD  promethazine (PHENERGAN) 25 MG tablet Take 1 tablet (25 mg total) by mouth every 8 (eight) hours as needed for nausea or vomiting. 12/05/14  Yes Shawnee Knapp, MD  Tamsulosin HCl (FLOMAX) 0.4 MG CAPS Take 0.4 mg by mouth daily.    Yes Historical Provider, MD  triamterene-hydrochlorothiazide (MAXZIDE-25) 37.5-25 MG per tablet Take 1 tablet by mouth See admin instructions. Pt takes Monday through Friday. 05/17/14  Yes Mihai Croitoru, MD  warfarin (COUMADIN) 2.5 MG tablet Take 2.5 mg by mouth daily at 6 PM.   Yes Historical Provider, MD  hydrOXYzine (ATARAX/VISTARIL) 10 MG tablet Take 10 mg by mouth 3 (three) times daily as needed.    Historical Provider, MD  pantoprazole (PROTONIX) 40 MG tablet Take 1 tablet (40 mg total) by mouth daily. Patient not taking: Reported on 12/07/2014 05/17/14   Sanda Klein, MD  warfarin (COUMADIN) 5 MG tablet Take 1-2.5 tablets (5-12.5 mg total) by mouth daily. Pt takes 1 tab on tue,wed,thu,sat,and sun. And takes 2  & 1/2 tabs on Mon, and Fri Patient not taking: Reported on 12/07/2014 05/17/14   Sanda Klein, MD    Allergies:   Allergies  Allergen Reactions  . Statins     Leg cramps.   Tolerates pravastatin.    Marland Kitchen Penicillins Rash    Social History:  Ambulatory  independently   Lives at home  With family    reports that he has never smoked. He does not have any smokeless tobacco history on file. He reports that he does not drink alcohol or use illicit drugs.    Family History: family history includes Diabetes in his maternal grandmother; Heart disease in his mother; Prostate cancer in his father. There is no history of Colon cancer.    Physical Exam: Patient Vitals for the past 24 hrs:  BP Temp Temp src Pulse Resp SpO2  12/07/14 1954 131/79 mmHg - - 66 10 97 %  12/07/14 1952 131/79 mmHg 97.7 F (36.5 C) Oral 68 18 97 %  12/07/14 1641 145/87 mmHg 97.7 F (36.5 C) Oral 70 12 98 %  12/07/14 1640 - - - - -  97 %    1. General:  in No Acute distress 2. Psychological: Alert and Oriented 3. Head/ENT:     Dry Mucous Membranes                          Head Non traumatic, neck supple                          Normal  Dentition 4. SKIN:  decreased Skin turgor,  Skin clean Dry and intact no rash 5. Heart: Regular rate and rhythm no Murmur, Rub or gallop 6. Lungs: Clear to auscultation bilaterally, no wheezes or crackles   7. Abdomen: Soft, non-tender, Non distended 8. Lower extremities: no clubbing, cyanosis, or edema 9. Neurologically Nystagmus to the left. Otherwise strength 5/5 in all 4 ext, CN 2-12 intact.  10. MSK: Normal range of motion  body mass index is unknown because there is no weight on file.   Labs on Admission:   Results for orders placed or performed during the hospital encounter of 12/07/14 (from the past 24 hour(s))  CBC     Status: Abnormal   Collection Time: 12/07/14  5:08 PM  Result Value Ref Range   WBC 10.5 4.0 - 10.5 K/uL   RBC 5.09 4.22 - 5.81 MIL/uL    Hemoglobin 17.2 (H) 13.0 - 17.0 g/dL   HCT 48.6 39.0 - 52.0 %   MCV 95.5 78.0 - 100.0 fL   MCH 33.8 26.0 - 34.0 pg   MCHC 35.4 30.0 - 36.0 g/dL   RDW 13.0 11.5 - 15.5 %   Platelets 198 150 - 400 K/uL  Basic metabolic panel     Status: Abnormal   Collection Time: 12/07/14  5:08 PM  Result Value Ref Range   Sodium 135 135 - 145 mmol/L   Potassium 3.8 3.5 - 5.1 mmol/L   Chloride 100 96 - 112 mmol/L   CO2 23 19 - 32 mmol/L   Glucose, Bld 106 (H) 70 - 99 mg/dL   BUN 36 (H) 6 - 23 mg/dL   Creatinine, Ser 3.37 (H) 0.50 - 1.35 mg/dL   Calcium 8.9 8.4 - 10.5 mg/dL   GFR calc non Af Amer 17 (L) >90 mL/min   GFR calc Af Amer 20 (L) >90 mL/min   Anion gap 12 5 - 15  Urinalysis, Routine w reflex microscopic     Status: Abnormal   Collection Time: 12/07/14  5:43 PM  Result Value Ref Range   Color, Urine YELLOW YELLOW   APPearance CLOUDY (A) CLEAR   Specific Gravity, Urine 1.009 1.005 - 1.030   pH 5.0 5.0 - 8.0   Glucose, UA NEGATIVE NEGATIVE mg/dL   Hgb urine dipstick NEGATIVE NEGATIVE   Bilirubin Urine NEGATIVE NEGATIVE   Ketones, ur NEGATIVE NEGATIVE mg/dL   Protein, ur NEGATIVE NEGATIVE mg/dL   Urobilinogen, UA 0.2 0.0 - 1.0 mg/dL   Nitrite NEGATIVE NEGATIVE   Leukocytes, UA NEGATIVE NEGATIVE    UA concentrated  No results found for: HGBA1C  Estimated Creatinine Clearance: 24.1 mL/min (by C-G formula based on Cr of 3.37).  BNP (last 3 results) No results for input(s): PROBNP in the last 8760 hours.  Other results:  I have pearsonaly reviewed this: ECG REPORT  Rate: 65  Rhythm: paced     There were no vitals filed for this visit.   Cultures: No results found for: SDES, Williamson, CULT, REPTSTATUS  Radiological Exams on Admission: Dg Chest 2 View  12/07/2014   CLINICAL DATA:  Dizziness for 4 days. History of atrial fibrillation.  EXAM: CHEST  2 VIEW  COMPARISON:  02/07/2012  FINDINGS: Left subclavian approach 3 lead pacemaker remains in place, with a fractured lead  again noted. The cardiac silhouette remains mildly enlarged. Increased density in the right lung base is unchanged corresponding to mildly prominent pulmonary vessels and superimposed costochondral calcification. The lungs are well inflated without evidence of airspace consolidation, edema, pleural effusion, or pneumothorax. No acute osseous abnormality is identified.  IMPRESSION: No active cardiopulmonary disease.   Electronically Signed   By: Logan Bores   On: 12/07/2014 17:36   Ct Head Wo Contrast  12/07/2014   CLINICAL DATA:  Dizziness beginning Saturday. Went to urgent care on Monday. CT was performed and reportedly negative. Patient returned urgent care today for followup. Dizziness continued. Patient was sent tumor is to department.  EXAM: CT HEAD WITHOUT CONTRAST  TECHNIQUE: Contiguous axial images were obtained from the base of the skull through the vertex without intravenous contrast.  COMPARISON:  12/05/2014  FINDINGS: Mild cerebral atrophy. No ventricular dilatation. Mild white matter changes suggesting small vessel ischemia. Old lacunar infarct in the cerebellar hemispheres bilaterally. No change since previous study. No mass effect or midline shift. No abnormal extra-axial fluid collections. Gray-white matter junctions are distinct. Basal cisterns are not effaced. No evidence of acute intracranial hemorrhage. No depressed skull fractures. Visualized paranasal sinuses and mastoid air cells are not opacified.  IMPRESSION: No acute intracranial abnormalities. Mild chronic atrophy and small vessel ischemic changes   Electronically Signed   By: Lucienne Capers M.D.   On: 12/07/2014 17:30   Ct Head Wo Contrast  12/05/2014   CLINICAL DATA:  Dizziness, onset 3 days ago. Nausea and emesis. Hypertension and atrial fibrillation  EXAM: CT HEAD WITHOUT CONTRAST  TECHNIQUE: Contiguous axial images were obtained from the base of the skull through the vertex without intravenous contrast.  COMPARISON:   05/29/2010  FINDINGS: Small remote lacunar infarct observed in the left inferior cerebellum, image 5 series 2. Similar small right cerebellar lacunar infarct, image 8 series 2.  The brainstem, cerebral peduncle is, thalami, basal ganglia, ventricular system, and basilar cisterns appear normal.  No intracranial hemorrhage, mass lesion, or acute CVA.  The middle ears and mastoid air cells appear normal bilaterally. The internal auditory canal is appear normal and symmetric. No asymmetry along the cerebellopontine angles.  IMPRESSION: 1. No significant intracranial abnormality to explain the patient's dizziness. 2. Several small remote lacunar infarcts are present in the cerebellum, unchanged from 2011.   Electronically Signed   By: Van Clines M.D.   On: 12/05/2014 21:20    Chart has been reviewed  Assessment/Plan  69 yo M with hx of A.fib on coumadin, prior TIA, CAD, systolic CHF presents with ataxia nasuea and vomiting resulting in dehydration.   Present on Admission:  . Ataxia possible CVA - unable to obtain MRI due to pacemaker, neurology was awre recommended dc to home with follow up work up but given ARF  Will admit and keep at [facility] will finish CVA work up with carotid doppler and Echo. CT head negative x2 but posterior lesion maybe poorly visiualized  . Single chamber St. Jude pacemaker 2013 -stable . HTN (hypertension) - permissive hypertension hold ARB given ARF and hold HCTZ given dehydration . Cardiomyopathy, ischemic - Chronic repeat echo, currently fluid down . Atrial fibrillation, permanent - now paced continue  metoprolol and coumadin . CHB (complete heart block) - sp pacemaker . CAD (coronary artery disease) -stable cont, aspirin, statin and betablocker . ARF (acute renal failure) - - likely secondary to dehydration, check FeNA and if not improved with IVF would obtain renal US and consider renal consult.    Prophylaxis: SCD  , Protonix  CODE STATUS:  FULL CODE   Other plan  as per orders.  I have spent a total of 55 min on this admission  Maximus Hoffert 12/07/2014, 7:59 PM  [facility]  Pager [phone]   after 2 AM please page floor coverage PA If 7AM-7PM, please contact the day team taking care of the patient  [email or web address]  Password [email or web address]

## 2014-12-07 NOTE — Telephone Encounter (Signed)
Spoke w/ daughter, she reports pt had symptoms since Saturday of dizziness, N&V. (1 episode of vomiting Saturday, has been recurrent ~1-2 days.) He was evaluated by PCP on Monday for symptoms, trialed on meclizine to no effect, put on xanax and promethazine. EKG and orthostatics performed there. EKG showed paced SR. Neg for orthostatic changes. He had head CT Monday evening - normal findings. Daughter concerned for continued dizziness, vomiting. Pt has eaten little and consumed scant fluids. He is unable to keep meds down w/o vomiting.  I advised concern for CV and renal function w/ diminished fluid intake and suggested ER visit for evaluation, rehydration and IV antiemetics.  Caller voiced understanding.

## 2014-12-07 NOTE — Telephone Encounter (Signed)
Attempted to call pt's wife. No answer. Did not leave VM per pt's request.

## 2014-12-07 NOTE — Telephone Encounter (Signed)
Agree. Sounds more vestibular/ENT than cardiac

## 2014-12-07 NOTE — Telephone Encounter (Signed)
Pt have been dizzy since Saturday. He went to Urgent Care on Monday and Monday night had CT. He seems to be getting worse.

## 2014-12-07 NOTE — Telephone Encounter (Signed)
Spoke to pt's wife. She was advised to bring him here or to the ED for further evaluation. Dr. Everlene Farrier also spoke with pt's wife and advised the same thing.

## 2014-12-07 NOTE — Discharge Instructions (Signed)

## 2014-12-07 NOTE — Progress Notes (Signed)
ANTICOAGULATION CONSULT NOTE - Initial Consult  Pharmacy Consult for Warfarin Indication: atrial fibrillation  Allergies  Allergen Reactions  . Statins     Leg cramps.   Tolerates pravastatin.    Marland Kitchen Penicillins Rash    Patient Measurements: Height: 5\' 9"  (175.3 cm) Weight: 218 lb 3.2 oz (98.975 kg) IBW/kg (Calculated) : 70.7  Vital Signs: Temp: 97.8 F (36.6 C) (02/24 2129) Temp Source: Oral (02/24 2129) BP: 125/74 mmHg (02/24 2129) Pulse Rate: 68 (02/24 2129)  Labs:  Recent Labs  12/05/14 1929 12/07/14 1708 12/07/14 2012  HGB 17.7 17.2*  --   HCT 52.8 48.6  --   PLT  --  198  --   LABPROT 27.3*  --  37.8*  INR 2.53*  --  3.81*  CREATININE 1.19 3.37*  --   TROPONINI  --   --  <0.03    Estimated Creatinine Clearance: 24.3 mL/min (by C-G formula based on Cr of 3.37).   Medical History: Past Medical History  Diagnosis Date  . Atrial fibrillation   . Lymphoma 1998    of colon   . Diverticulosis   . Esophageal stricture   . Hyperlipidemia   . Hypertension   . CVA (cerebral infarction)   . Colon polyps   . Urinary frequency   . Umbilical hernia   . Congenital heart block   . CHB (complete heart block) 11/06/2013  . Cardiomyopathy, ischemic 11/07/2013    Assessment: 77 y/oM with PMH of atrial fibrillation, lymphoma, diverticulosis, HTN, CVA, HLD, esophageal stricture, CHB, ICM who presents with ataxia, n/v, and vertigo. Neurology consulted and recommended CVA workup as outpatient, but now being admitted due to ARF and workup to be completed while patient at [facility]. CT head negative x 2 for acute intracranial abnormality but posterior lesion maybe poorly visiualized  Pharmacy consulted to assist with warfarin dosing while patient in the hospital.  Home warfarin dose: 2.5mg  daily, last reported dose 2/23 at 1800  Today, 12/07/2014:  INR 3.81, supratherapeutic  Hgb/Hct, Pltc WNL  No bleeding issues reported  Goal of Therapy:  INR 2-3 Monitor platelets by  anticoagulation protocol: Yes   Plan:   No warfarin tonight, as INR supratherapeutic, likely due to poor PO intake and n/v.  Daily PT/INR  CBC at least q72h while inpatient.  Monitor for signs/symptoms of bleeding.   Lindell Spar, PharmD, BCPS Pager: [phone] 12/07/2014 9:47 PM

## 2014-12-07 NOTE — ED Notes (Signed)
Dizziness began Saturday. Went to urgent care Monday, CT scanned preformed, per EMS all results negative. Pt returned to [facility] urgent care today for routine follow up, dizziness continued, doctor there wanted pt sent to ER.

## 2014-12-08 ENCOUNTER — Telehealth: Payer: Self-pay | Admitting: Cardiovascular Disease

## 2014-12-08 DIAGNOSIS — I2510 Atherosclerotic heart disease of native coronary artery without angina pectoris: Secondary | ICD-10-CM

## 2014-12-08 DIAGNOSIS — Z79899 Other long term (current) drug therapy: Secondary | ICD-10-CM

## 2014-12-08 DIAGNOSIS — R42 Dizziness and giddiness: Secondary | ICD-10-CM

## 2014-12-08 DIAGNOSIS — R270 Ataxia, unspecified: Secondary | ICD-10-CM

## 2014-12-08 DIAGNOSIS — Z7901 Long term (current) use of anticoagulants: Secondary | ICD-10-CM

## 2014-12-08 DIAGNOSIS — Z8673 Personal history of transient ischemic attack (TIA), and cerebral infarction without residual deficits: Secondary | ICD-10-CM

## 2014-12-08 LAB — LIPID PANEL
CHOL/HDL RATIO: 6.4 ratio
Cholesterol: 205 mg/dL — ABNORMAL HIGH (ref 0–200)
HDL: 32 mg/dL — AB (ref >39)
LDL CALC: 144 mg/dL — AB (ref 0–99)
Triglycerides: 143 mg/dL (ref <150)
VLDL: 29 mg/dL (ref 0–40)

## 2014-12-08 LAB — BASIC METABOLIC PANEL
Anion gap: 8 (ref 5–15)
BUN: 37 mg/dL — ABNORMAL HIGH (ref 6–23)
CHLORIDE: 101 mmol/L (ref 96–112)
CO2: 27 mmol/L (ref 19–32)
Calcium: 8.5 mg/dL (ref 8.4–10.5)
Creatinine, Ser: 3.9 mg/dL — ABNORMAL HIGH (ref 0.50–1.35)
GFR calc Af Amer: 17 mL/min — ABNORMAL LOW (ref >90)
GFR, EST NON AFRICAN AMERICAN: 15 mL/min — AB (ref >90)
GLUCOSE: 119 mg/dL — AB (ref 70–99)
Potassium: 3.5 mmol/L (ref 3.5–5.1)
SODIUM: 136 mmol/L (ref 135–145)

## 2014-12-08 LAB — GLUCOSE, CAPILLARY
GLUCOSE-CAPILLARY: 88 mg/dL (ref 70–99)
GLUCOSE-CAPILLARY: 138 mg/dL — AB (ref 70–99)
Glucose-Capillary: 101 mg/dL — ABNORMAL HIGH (ref 70–99)
Glucose-Capillary: 114 mg/dL — ABNORMAL HIGH (ref 70–99)

## 2014-12-08 LAB — TROPONIN I
Troponin I: <0.03 ng/mL (ref <0.031)
Troponin I: <0.03 ng/mL (ref <0.031)

## 2014-12-08 LAB — PROTIME-INR
INR: 4.21 — ABNORMAL HIGH (ref 0.00–1.49)
PROTHROMBIN TIME: 40.9 s — AB (ref 11.6–15.2)

## 2014-12-08 LAB — SODIUM, URINE, RANDOM: Sodium, Ur: 66 mEq/L

## 2014-12-08 LAB — CREATININE, URINE, RANDOM: Creatinine, Urine: 92.1 mg/dL

## 2014-12-08 MED ORDER — SODIUM CHLORIDE 0.9 % IV BOLUS (SEPSIS)
500.0000 mL | Freq: Once | INTRAVENOUS | Status: AC
  Administered 2014-12-08: 500 mL via INTRAVENOUS

## 2014-12-08 MED ORDER — SODIUM CHLORIDE 0.9 % IV SOLN
INTRAVENOUS | Status: DC
  Administered 2014-12-08 (×2): via INTRAVENOUS

## 2014-12-08 NOTE — Progress Notes (Signed)
PROGRESS NOTE  Benjamin Mckay [facility] HKV:[id number] DOB: 04/17/1946 DOA: 12/07/2014 PCP: Pcp Not In System  HPI: Benjamin Mckay is a 69 y.o. male has a past medical history of Atrial fibrillation; Lymphoma (1998); Diverticulosis; Esophageal stricture; Hyperlipidemia; Hypertension; CVA (cerebral infarction); Colon polyps; Urinary frequency; Umbilical hernia; Congenital heart block; CHB (complete heart block) (11/06/2013); and Cardiomyopathy, ischemic (11/07/2013). Presented with Since Saturday AM patient developed ataxia worse when tried to ambulate, He vae had two episodes of vomiting, no headache. No localized weakness, no slurred speech. Patient was worried about nausea and discontinued eating or drinking for past 4 days. Only had some Gatorade today. Neurology seen him and recommended CVA work up as outpatient. He is unanble to get MRI as he is sp pacemaker for complete heart block. Routine labs showed ARF Hospitalist was called for admission. Patient on chronic coumadin with hx of A.fib. Echo from 2014 showing EF of 35-40%  Subjective / 24 H Interval events - patient continues to feel weak and dizzy this morning  Assessment/Plan: Active Problems:   Transient cerebral ischemia   HTN (hypertension)   CAD (coronary artery disease)   Atrial fibrillation, permanent   Long term current use of anticoagulant therapy   CHB (complete heart block)   Single chamber St. Jude pacemaker 2013   Cardiomyopathy, ischemic   ARF (acute renal failure)   Generalized weakness, nausea,? Ataxia - neurology consulted  - CT head was negative for acute stroke, patient cannot have an MRI due to the presence of pacemaker - Continues to feel symptomatic, persistently hypotensive today, this is likely explaining his symptoms. This is been going on for a few days.   Systolic heart failure due to ischemic cardiomyopathy  - 2-D echo pending - Given trajectory of the patient's renal function and needing more fluids in the  setting of heart failure, I have consulted cardiology  Acute renal failure -likely in the setting of hypotension, he is not responding to fluid, I wonder if this is related to his heart failure, 2-D echo was obtained and is currently pending. ? Cardiorenal component  Single chamber St. Jude pacemaker 2013 -stable  HTN (hypertension) - hold home antihypertensives.   Atrial fibrillation, permanent / Complete heart block / - now paced, discontinue Metoprolol  CAD (coronary artery disease) -stable no chest pain, cont aspirin, statin and betablocker   Diet: Diet Heart Fluids: NS 100 cc/h DVT Prophylaxis: Coumadin  Code Status: Full Code Family Communication: d/w patient  Disposition Plan: remain inpatient   Consultants:  Neurology  Cardiology   Procedures:  None    Antibiotics  Anti-infectives    None       Studies  Dg Chest 2 View  12/07/2014   CLINICAL DATA:  Dizziness for 4 days. History of atrial fibrillation.  EXAM: CHEST  2 VIEW  COMPARISON:  02/07/2012  FINDINGS: Left subclavian approach 3 lead pacemaker remains in place, with a fractured lead again noted. The cardiac silhouette remains mildly enlarged. Increased density in the right lung base is unchanged corresponding to mildly prominent pulmonary vessels and superimposed costochondral calcification. The lungs are well inflated without evidence of airspace consolidation, edema, pleural effusion, or pneumothorax. No acute osseous abnormality is identified.  IMPRESSION: No active cardiopulmonary disease.   Electronically Signed   By: Logan Bores   On: 12/07/2014 17:36   Ct Head Wo Contrast  12/07/2014   CLINICAL DATA:  Dizziness beginning Saturday. Went to urgent care on Monday. CT was performed and reportedly negative.  Patient returned urgent care today for followup. Dizziness continued. Patient was sent tumor is to department.  EXAM: CT HEAD WITHOUT CONTRAST  TECHNIQUE: Contiguous axial images were obtained from the  base of the skull through the vertex without intravenous contrast.  COMPARISON:  12/05/2014  FINDINGS: Mild cerebral atrophy. No ventricular dilatation. Mild white matter changes suggesting small vessel ischemia. Old lacunar infarct in the cerebellar hemispheres bilaterally. No change since previous study. No mass effect or midline shift. No abnormal extra-axial fluid collections. Gray-white matter junctions are distinct. Basal cisterns are not effaced. No evidence of acute intracranial hemorrhage. No depressed skull fractures. Visualized paranasal sinuses and mastoid air cells are not opacified.  IMPRESSION: No acute intracranial abnormalities. Mild chronic atrophy and small vessel ischemic changes   Electronically Signed   By: Lucienne Capers M.D.   On: 12/07/2014 17:30    Objective  Filed Vitals:   12/08/14 1040 12/08/14 1401 12/08/14 1427 12/08/14 1438  BP: 90/46 91/53 84/51  86/50  Pulse:  67 69   Temp:  98 F (36.7 C) 97.5 F (36.4 C)   TempSrc:  Oral Oral   Resp:  18 14   Height:      Weight:      SpO2:  96% 98%     Intake/Output Summary (Last 24 hours) at 12/08/14 1449 Last data filed at 12/08/14 1446  Gross per 24 hour  Intake 2775.34 ml  Output    620 ml  Net 2155.34 ml   Filed Weights   12/07/14 2129  Weight: 98.975 kg (218 lb 3.2 oz)   Exam:  General:  NAD  HEENT: no scleral icterus  Cardiovascular: RRR without MRG  Respiratory: CTA biL  Abdomen: soft, non tender  MSK/Extremities: no clubbing/cyanosis  Skin: no rashes   Neuro: non focal   Data Reviewed: Basic Metabolic Panel:  Recent Labs Lab 12/05/14 1929 12/07/14 1708 12/08/14 0850  NA 137 135 136  K 4.7 3.8 3.5  CL 98 100 101  CO2 27 23 27   GLUCOSE 89 106* 119*  BUN 17 36* 37*  CREATININE 1.19 3.37* 3.90*  CALCIUM 9.6 8.9 8.5   Liver Function Tests:  Recent Labs Lab 12/05/14 1929  AST 28  ALT 32  ALKPHOS 83  BILITOT 1.5*  PROT 7.6  ALBUMIN 4.2   CBC:  Recent Labs Lab  12/05/14 1929 12/07/14 1708  WBC 8.2 10.5  HGB 17.7 17.2*  HCT 52.8 48.6  MCV 99.1* 95.5  PLT  --  198   Cardiac Enzymes:  Recent Labs Lab 12/07/14 2012 12/08/14 0235 12/08/14 0850  TROPONINI <0.03 <0.03 <0.03   CBG:  Recent Labs Lab 12/08/14 0732 12/08/14 1151  GLUCAP 88 138*   Scheduled Meds: . amiodarone  200 mg Oral Daily  . aspirin  81 mg Oral Daily  . metoprolol  75 mg Oral BID  . pantoprazole  40 mg Oral Daily  . pravastatin  80 mg Oral q1800  . tamsulosin  0.4 mg Oral Daily  . Warfarin - Pharmacist Dosing Inpatient   Does not apply q1800   Continuous Infusions: . sodium chloride 100 mL/hr at 12/08/14 1054    Marzetta Board, MD [facility] Pager [phone]. If 7 PM - 7 AM, please contact night-coverage at [email or web address], password [facility] 12/08/2014, 2:49 PM  LOS: 1 day

## 2014-12-08 NOTE — Progress Notes (Signed)
PT Cancellation Note  Patient Details Name: Benjamin Mckay MRN: [id number] DOB: 1946/07/12   Cancelled Treatment:    Reason Eval/Treat Not Completed: Medical issues which prohibited therapy (BP  remains very low.)   Claretha Cooper 12/08/2014, 3:49 PM

## 2014-12-08 NOTE — Progress Notes (Signed)
Pt BP remains soft 86/50 manually. UO only 213ml this shift. Dr Cruzita Lederer aware. States he will enter orders for fluid bolus.

## 2014-12-08 NOTE — Progress Notes (Signed)
Nutrition Brief Note  Patient identified on the Malnutrition Screening Tool (MST) Report  Wt Readings from Last 15 Encounters:  12/07/14 218 lb 3.2 oz (98.975 kg)  12/07/14 221 lb 6.4 oz (100.426 kg)  12/05/14 222 lb 4 oz (100.812 kg)  10/18/14 232 lb 9.6 oz (105.507 kg)  09/03/14 230 lb (104.327 kg)  05/17/14 228 lb (103.42 kg)  11/02/13 222 lb 9.6 oz (100.971 kg)  09/16/12 220 lb 4 oz (99.905 kg)  02/07/12 218 lb 6.4 oz (99.066 kg)  01/31/12 216 lb 0.8 oz (98 kg)  08/21/11 224 lb (101.606 kg)  05/15/11 210 lb (95.255 kg)  04/26/11 217 lb 12.8 oz (98.793 kg)  05/22/10 216 lb (97.977 kg)    Body mass index is 32.21 kg/(m^2). Patient meets criteria for obesity based on current BMI.   Current diet order is heart healthy, patient is consuming approximately 100% of meals at this time. Labs and medications reviewed.   No nutrition interventions warranted at this time. If nutrition issues arise, please consult RD.   Laurette Schimke [place], [employer], [place]

## 2014-12-08 NOTE — Telephone Encounter (Signed)
Unfortunately, he cannot have an MRI

## 2014-12-08 NOTE — Progress Notes (Signed)
Pt BP 90/46 manual. Dr Cruzita Lederer aware, AM doses of Toprol and Amiodarone held. Pt updated.

## 2014-12-08 NOTE — Telephone Encounter (Signed)
Returned call, she voiced understanding of information given.

## 2014-12-08 NOTE — Progress Notes (Signed)
Subjective: No overnight events. Patient reports feeling slightly worse this morning with increased dizziness.BP low this morning with systolic in the 64-40H.  Admitted due to ARF.   Carotid doppler completed this morning, shows 60-79% stenosis of R ICA with 1-39% stenosis of left ICA. This is similar to prior studies from 2008 and 2014.   Objective: Current vital signs: BP 85/47 mmHg  Pulse 69  Temp(Src) 98.1 F (36.7 C) (Oral)  Resp 13  Ht 5\' 9"  (1.753 m)  Wt 98.975 kg (218 lb 3.2 oz)  BMI 32.21 kg/m2  SpO2 95% Vital signs in last 24 hours: Temp:  [97.3 F (36.3 C)-98.1 F (36.7 C)] 98.1 F (36.7 C) (02/25 0554) Pulse Rate:  [65-72] 69 (02/25 1007) Resp:  [10-18] 13 (02/25 1007) BP: (85-145)/(47-87) 85/47 mmHg (02/25 1007) SpO2:  [95 %-98 %] 95 % (02/25 1007) Weight:  [98.975 kg (218 lb 3.2 oz)-100.426 kg (221 lb 6.4 oz)] 98.975 kg (218 lb 3.2 oz) (02/24 2129)  Intake/Output from previous day: 02/24 0701 - 02/25 0700 In: 943.7 [P.O.:222; I.V.:721.7] Out: 520 [Urine:520] Intake/Output this shift: Total I/O In: 240 [P.O.:240] Out: -  Nutritional status: Diet Heart  Neurologic Exam: Neurologic Examination Mental Status: Alert, oriented, thought content appropriate.  Cranial Nerves: II:  visual fields grossly normal, pupils equal, round, reactive to light  III,IV, VI:  extra-ocular motions intact bilaterally, few non-sustained beats nystagmus with left lateral gaze V,VII: smile symmetric, facial light touch sensation normal bilaterally Motor: Right :Upper extremityLeft: Upper extremity 5/5 deltoid 5/5 deltoid 5/5 biceps5/5 biceps  5/5 triceps5/5 triceps 5/5 hand  grip5/5 hand grip Lower extremityLower extremity 5/5 hip flexor5/5 hip flexor 5/5 quadricep5/5 quadriceps  5/5 hamstrings5/5 hamstrings 5/5 plantar flexion 5/5 plantar flexion 5/5 plantar extension5/5 plantar extension Tone and bulk:normal tone throughout; no atrophy noted Sensory: Pinprick and light touch intact throughout, bilaterally. Proprioception intact Lab Results: Basic Metabolic Panel:  Recent Labs Lab 12/01/14 1049 12/05/14 1929 12/07/14 1708 12/08/14 0850  NA 138 137 135 136  K 4.4 4.7 3.8 3.5  CL 104 98 100 101  CO2 26 27 23 27   GLUCOSE 96 89 106* 119*  BUN 13 17 36* 37*  CREATININE 1.13 1.19 3.37* 3.90*  CALCIUM 8.6 9.6 8.9 8.5    Liver Function Tests:  Recent Labs Lab 12/01/14 1049 12/05/14 1929  AST 22 28  ALT 25 32  ALKPHOS 78 83  BILITOT 1.1 1.5*  PROT 6.4 7.6  ALBUMIN 3.7 4.2   No results for input(s): LIPASE, AMYLASE in the last 168 hours. No results for input(s): AMMONIA in the last 168 hours.  CBC:  Recent Labs Lab 12/01/14 1049 12/05/14 1929 12/07/14 1708  WBC 5.7 8.2 10.5  HGB 15.1 17.7 17.2*  HCT 45.4 52.8 48.6  MCV 98.9 99.1* 95.5  PLT 199  --  198    Cardiac Enzymes:  Recent Labs Lab 12/07/14 2012 12/08/14 0235 12/08/14 0850  TROPONINI <0.03 <0.03 <0.03    Lipid Panel:  Recent Labs Lab 12/01/14 1049 12/08/14 0235  CHOL 158 205*  TRIG 91 143  HDL 35* 32*  CHOLHDL 4.5 6.4  VLDL 18 29  LDLCALC 105* 144*    CBG:  Recent Labs Lab 12/08/14 0732  GLUCAP 25    Microbiology: Results for orders  placed or performed during the hospital encounter of 01/31/12  Surgical pcr screen     Status: None   Collection Time: 01/31/12  7:44 AM  Result Value Ref Range Status   MRSA, PCR  NEGATIVE NEGATIVE Final   Staphylococcus aureus NEGATIVE NEGATIVE Final    Comment:        The Xpert SA Assay (FDA approved for NASAL specimens only), is one component of a comprehensive surveillance program.  It is not intended to diagnose infection nor to guide or monitor treatment.    Coagulation Studies:  Recent Labs  12/05/14 1929 12/07/14 2012 12/08/14 0235  LABPROT 27.3* 37.8* 40.9*  INR 2.53* 3.81* 4.21*    Imaging: Dg Chest 2 View  12/07/2014   CLINICAL DATA:  Dizziness for 4 days. History of atrial fibrillation.  EXAM: CHEST  2 VIEW  COMPARISON:  02/07/2012  FINDINGS: Left subclavian approach 3 lead pacemaker remains in place, with a fractured lead again noted. The cardiac silhouette remains mildly enlarged. Increased density in the right lung base is unchanged corresponding to mildly prominent pulmonary vessels and superimposed costochondral calcification. The lungs are well inflated without evidence of airspace consolidation, edema, pleural effusion, or pneumothorax. No acute osseous abnormality is identified.  IMPRESSION: No active cardiopulmonary disease.   Electronically Signed   By: Logan Bores   On: 12/07/2014 17:36   Ct Head Wo Contrast  12/07/2014   CLINICAL DATA:  Dizziness beginning Saturday. Went to urgent care on Monday. CT was performed and reportedly negative. Patient returned urgent care today for followup. Dizziness continued. Patient was sent tumor is to department.  EXAM: CT HEAD WITHOUT CONTRAST  TECHNIQUE: Contiguous axial images were obtained from the base of the skull through the vertex without intravenous contrast.  COMPARISON:  12/05/2014  FINDINGS: Mild cerebral atrophy. No ventricular dilatation. Mild white matter changes suggesting small vessel ischemia. Old lacunar  infarct in the cerebellar hemispheres bilaterally. No change since previous study. No mass effect or midline shift. No abnormal extra-axial fluid collections. Gray-white matter junctions are distinct. Basal cisterns are not effaced. No evidence of acute intracranial hemorrhage. No depressed skull fractures. Visualized paranasal sinuses and mastoid air cells are not opacified.  IMPRESSION: No acute intracranial abnormalities. Mild chronic atrophy and small vessel ischemic changes   Electronically Signed   By: Lucienne Capers M.D.   On: 12/07/2014 17:30    Medications:  Scheduled: . amiodarone  200 mg Oral Daily  . aspirin  81 mg Oral Daily  . metoprolol  75 mg Oral BID  . pantoprazole  40 mg Oral Daily  . pravastatin  80 mg Oral q1800  . tamsulosin  0.4 mg Oral Daily  . Warfarin - Pharmacist Dosing Inpatient   Does not apply q1800    Assessment/Plan:  69y/o gentleman history of A fib (on coumadin), prior CVA, HTN presenting with 5 day history of gait instability. Gait is slightly wide based but neurological exam is overall non-focal. CT head imaging shows no acute infarct. Differential includes small posterior circulation infarct vs possible peripheral vestibular dysfunction. Blood pressure is also currently low which may be contributing to his symptoms. (orthostatic vital signs negative in the ED). Patient is unfortunately unable to get MRI due to PPM.   Patient has stable asymptomatic carotid stenosis with right ICA stenosis of 60-79%.   -pending 2D echo -continue coumadin and pravastatin -PT evaluation -consider IV fluid bolus and may need to taper down BP medications.  -will follow as needed.    LOS: 1 day   Jim Like, DO [facility] [phone]  If 7pm- 7am, please page neurology on call as listed in [place]. 12/08/2014  10:29 AM

## 2014-12-08 NOTE — Progress Notes (Signed)
VASCULAR LAB PRELIMINARY  PRELIMINARY  PRELIMINARY  PRELIMINARY  Carotid Dopplers completed.    Preliminary report:  There is 60-79% right ICA stenosis.  There is 1-39% left ICA stenosis.  Vertebral artery flow is antegrade.   Philamena Kramar, RVT 12/08/2014, 9:50 AM

## 2014-12-08 NOTE — Progress Notes (Signed)
OT Cancellation Note  Patient Details Name: Benjamin Mckay MRN: [id number] DOB: [birth year]   Cancelled Treatment:    Reason Eval/Treat Not Completed: Other (comment).  Checked with RN.  Will check back when BP is higher.    Keara Pagliarulo 12/08/2014, 2:55 PM  Lesle Chris, OTR/L [phone] 12/08/2014

## 2014-12-08 NOTE — Consult Note (Signed)
CARDIOLOGY CONSULT NOTE   Patient ID: Benjamin Mckay MRN: [id number] DOB/AGE: 69/29/1947 69 y.o.  Admit Date: 12/07/2014  Primary Physician: Pcp Not In System  Primary Cardiologist   [place] Reason for Consultation:   Clinical Summary Benjamin Mckay is a 69 y.o.male.  The patient had several days of ataxia and dizziness. He did not eat or drink for several days. He did continue to take all of his medications including his diuretic. When his dizziness and ataxia continued he came to the emergency room. Ultimately it was noted that he was in renal failure and was admitted for further evaluation. Several days before admission his creatinine was in the range of 1.1. It is now in the range of 3.5 and his gone up to 3.9 despite IV fluids. He has tolerated the fluids well. He is had a CT scan of the head that was then repeated 2 days later. It shows old cerebellar lacunar infarcts but no acute abnormalities. The studies have been done without contrast. He cannot have an MRI because of his pacemaker. He has been seen by neurology. The exact etiology of his neurologic symptomatology is still not clear. The differential includes cerebral infarcts that are small or vestibular disease.  The patient's cardiac status is followed carefully by Dr. Sallyanne Kuster  (Dr. Loletha Grayer). He has a history of coronary disease that has been followed over time. There is history of ischemic cardiomyopathy with an ejection fraction in the past of 35-40%. He's had complete heart block and has a single-chamber pacemaker. This pacemaker has been replaced many times over the years. He takes amiodarone. Dr. Loletha Grayer suggested earlier this year that there was no longer an indication for the amiodarone. However the patient chose to continue to take it. He has moderate carotid disease. There is a history in the past of TIAs with lacunar infarcts. There is a history of renal artery stenosis. I cannot tell the severity of this, but he has never had severe  hypertension related to it. Historically his volume has been controlled despite his left ventricular dysfunction. There is also a history of non-Hodgkin's lymphoma of the colon for which he had surgery. The patient has chronic atrial fib. He is on long-term Coumadin for this. He's had good control of his INR and it was documented his therapeutic recently.   Allergies  Allergen Reactions  . Statins     Leg cramps.   Tolerates pravastatin.    Marland Kitchen Penicillins Rash    Medications Scheduled Medications: . amiodarone  200 mg Oral Daily  . aspirin  81 mg Oral Daily  . pantoprazole  40 mg Oral Daily  . pravastatin  80 mg Oral q1800  . Warfarin - Pharmacist Dosing Inpatient   Does not apply q1800     Infusions: . sodium chloride 100 mL/hr at 12/08/14 1553     PRN Medications:  acetaminophen, ALPRAZolam   Past Medical History  Diagnosis Date  . Atrial fibrillation   . Lymphoma 1998    of colon   . Diverticulosis   . Esophageal stricture   . Hyperlipidemia   . Hypertension   . CVA (cerebral infarction)   . Colon polyps   . Urinary frequency   . Umbilical hernia   . Congenital heart block   . CHB (complete heart block) 11/06/2013  . Cardiomyopathy, ischemic 11/07/2013    Past Surgical History  Procedure Laterality Date  . Appendectomy  1953  . Colectomy      for lymphoma  .  Pacemaker placement      replaced 3 x  . Tonsillectomy    . Neck fusion    . Back surgery    . Carotid doppler  11/04/2012    Proximal Rt ICA 50-99% diameter reduction; Lft Bulb demonstrated mild amount homogeneous plaque-not hemodynamically significant; Lft ICA-normal patency.  . Pacemaker generator change  01/31/2012    St Jude Med Accent DR RF model [id number] serial [id number]  . Cardiac catheterization  01/06/2003    Recommend medical therapy  . Cardiovascular stress test  07/16/2012    Mild-moderate perfusion defect seen in Basal inferior, Mid inferior, and Apica lateral consistent with infarct/scar.  No scintigraphic evidence for inducible myocardial ischemia. No ECG changes. EKG negative for ischemia.  . Transthoracic echocardiogram  11/04/2012    EF 65-68%, systolic function moderately reduced, mild regurg of the aortic and mitral valves.  . Permanent pacemaker generator change N/A 01/31/2012    Procedure: PERMANENT PACEMAKER GENERATOR CHANGE;  Surgeon: Sanda Klein, MD;  Location: [facility];  Service: Cardiovascular;  Laterality: N/A;    Family History  Problem Relation Age of Onset  . Prostate cancer Father   . Colon cancer Neg Hx   . Diabetes Maternal Grandmother   . Heart disease Mother     Social History Benjamin Mckay reports that he has never smoked. He does not have any smokeless tobacco history on file. Benjamin Mckay reports that he does not drink alcohol.  Review of Systems Patient denies fever, chills, headache, sweats, rash, change in vision, change in hearing, chest pain, cough, urinary symptoms. All other systems are reviewed and are negative.  Physical Examination Blood pressure 118/86, pulse 69, temperature 97.5 F (36.4 C), temperature source Oral, resp. rate 14, height 5\' 9"  (1.753 m), weight 218 lb 3.2 oz (98.975 kg), SpO2 98 %.  Intake/Output Summary (Last 24 hours) at 12/08/14 1815 Last data filed at 12/08/14 1601  Gross per 24 hour  Intake 2775.34 ml  Output   1020 ml  Net 1755.34 ml    Patient is oriented to person time and place. Affect is normal. His wife is in the room. Head is atraumatic. Sclera and conjunctiva are normal. There is no jugular venous distention. Lungs are clear. Respiratory effort is nonlabored. Cardiac exam reveals S1 and S2. The rhythm is regular. The abdomen is soft. There is no peripheral edema. There are no musculoskeletal deformities. There are no skin rashes. I did not perform a neurologic exam. The patient does have dizziness when he stands up. There is no room spinning.  Prior Cardiac Testing/Procedures  Lab Results  Basic  Metabolic Panel:  Recent Labs Lab 12/05/14 1929 12/07/14 1708 12/08/14 0850  NA 137 135 136  K 4.7 3.8 3.5  CL 98 100 101  CO2 27 23 27   GLUCOSE 89 106* 119*  BUN 17 36* 37*  CREATININE 1.19 3.37* 3.90*  CALCIUM 9.6 8.9 8.5    Liver Function Tests:  Recent Labs Lab 12/05/14 1929  AST 28  ALT 32  ALKPHOS 83  BILITOT 1.5*  PROT 7.6  ALBUMIN 4.2    CBC:  Recent Labs Lab 12/05/14 1929 12/07/14 1708  WBC 8.2 10.5  HGB 17.7 17.2*  HCT 52.8 48.6  MCV 99.1* 95.5  PLT  --  198    Cardiac Enzymes:  Recent Labs Lab 12/07/14 2012 12/08/14 0235 12/08/14 0850  TROPONINI <0.03 <0.03 <0.03    BNP: Invalid input(s): POCBNP   Radiology: Dg Chest 2 View  12/07/2014  CLINICAL DATA:  Dizziness for 4 days. History of atrial fibrillation.  EXAM: CHEST  2 VIEW  COMPARISON:  02/07/2012  FINDINGS: Left subclavian approach 3 lead pacemaker remains in place, with a fractured lead again noted. The cardiac silhouette remains mildly enlarged. Increased density in the right lung base is unchanged corresponding to mildly prominent pulmonary vessels and superimposed costochondral calcification. The lungs are well inflated without evidence of airspace consolidation, edema, pleural effusion, or pneumothorax. No acute osseous abnormality is identified.  IMPRESSION: No active cardiopulmonary disease.   Electronically Signed   By: Logan Bores   On: 12/07/2014 17:36   Ct Head Wo Contrast  12/07/2014   CLINICAL DATA:  Dizziness beginning Saturday. Went to urgent care on Monday. CT was performed and reportedly negative. Patient returned urgent care today for followup. Dizziness continued. Patient was sent tumor is to department.  EXAM: CT HEAD WITHOUT CONTRAST  TECHNIQUE: Contiguous axial images were obtained from the base of the skull through the vertex without intravenous contrast.  COMPARISON:  12/05/2014  FINDINGS: Mild cerebral atrophy. No ventricular dilatation. Mild white matter changes  suggesting small vessel ischemia. Old lacunar infarct in the cerebellar hemispheres bilaterally. No change since previous study. No mass effect or midline shift. No abnormal extra-axial fluid collections. Gray-white matter junctions are distinct. Basal cisterns are not effaced. No evidence of acute intracranial hemorrhage. No depressed skull fractures. Visualized paranasal sinuses and mastoid air cells are not opacified.  IMPRESSION: No acute intracranial abnormalities. Mild chronic atrophy and small vessel ischemic changes   Electronically Signed   By: Lucienne Capers M.D.   On: 12/07/2014 17:30     ECG: EKG shows pacing function.  Telemetry: Telemetry reveals a paced rhythm.   Impression and Recommendations     Dizziness and ataxia.       The etiology remains unclear. Unfortunately he cannot have an MRI. I suspect it is likely that he has had some type of small embolic event. However he has been well anticoagulated. There is no evidence of bleeding. I am not sure how we will get to the final diagnosis.    Non-Hodgkin's lymphoma of intestine      He had surgery for this. There is no evidence that he has any type of recurrence that would be affecting his brain at this time.    Transient cerebral ischemia     There is history of episodes in the past. His CT scan does show some lacunar infarcts.    HLD     He has been relatively statin intolerant. However he tolerates pravastatin and Zetia.  Carotid artery disease    The patient has documented carotid artery disease. He will need follow-up of his carotid Dopplers.    Renal artery stenosis    There is a history of renal artery stenosis. This has not affected his renal function or blood pressure in the past. I do not know if dehydration and volume depletion in the setting of renal artery stenosis leads to worsening events of acute renal failure.    CAD (coronary artery disease)     Coronary disease is stable. He is left ventricular  function may be somewhat lower at this time. He has not been having significant symptoms.    Atrial fibrillation, permanent     The patient has chronic atrial fibrillation. He has been anticoagulated with Coumadin and has documented good levels. It is possible that he could still have embolic events. It may be appropriate to proceed with  echo and transesophageal echo for optimal evaluation of the patient's cardiac status and left atrial appendage. This may help with making further decisions going forward. He should not receive vitamin K for his moderately elevated INR. This should be followed as it decreases over time. I am not aware of any data that proves that it would be better to switch him to another anticoagulant. However this could be considered going forward.      CHB (complete heart block)   Single chamber St. Jude pacemaker 2013     The patient's pacemaker is in place and is followed carefully. No change in therapy at this time.    Cardiomyopathy, ischemic     Historically his ejection fraction is 35-40%. The echo today is read as 30-35%. Without side decide review I cannot be sure if there is a definite change or not. He has never had problems with significant volume overload.    ARF (acute renal failure)   Dehydration     I do believe that his acute renal failure is due to dehydration. He did not take in significant fluids for approximately 4 days. He continued to take a diuretic at that time. I don't know what role renal artery stenosis can play with the acute changes. The patient has been receiving fluids since admission and I agree completely. His lungs are completely clear. He has no shortness of breath. His current IV is at 100 cc per hour. I would plan to continue this and carefully continue to assess his pulmonary status. Since it is possible he may have had embolic events to his head, consideration has to be given as to whether or not he has had an acute embolus to one or both of  his kidneys. Evaluation of this question would be appropriate. Fortunately he is beginning to make urine. I'm hopeful that we will begin to see improvement in his creatinine.    Warfarin anticoagulation     I have mentioned that consideration could be given to switching him to a different oral antral coagulant if it is assumed that he is having embolic events. There is no hard data on this.     Amiodarone therapy      it does not appear that amiodarone has lead to any of his acute problems. Over time Dr. Loletha Grayer  may encourage the patient further to stop amiodarone.  At this time I recommend continuing IV fluid and watching the patient's input and output very carefully along with regular assessments of his pulmonary status. Our team will consider whether a transesophageal echo will help [place] in any way.   Daryel November, MD 12/08/2014, 6:15 PM

## 2014-12-08 NOTE — Telephone Encounter (Signed)
Pt has a pacemaker,can he have a MRI?

## 2014-12-08 NOTE — Progress Notes (Signed)
Echocardiogram 2D Echocardiogram has been performed.  Joelene Millin 12/08/2014, 2:55 PM

## 2014-12-08 NOTE — Progress Notes (Signed)
ANTICOAGULATION CONSULT NOTE - Follow up  Pharmacy Consult for Warfarin Indication: atrial fibrillation  Allergies  Allergen Reactions  . Statins     Leg cramps.   Tolerates pravastatin.    Marland Kitchen Penicillins Rash    Patient Measurements: Height: 5\' 9"  (175.3 cm) Weight: 218 lb 3.2 oz (98.975 kg) IBW/kg (Calculated) : 70.7  Vital Signs: Temp: 98.1 F (36.7 C) (02/25 0554) Temp Source: Oral (02/25 0554) BP: 85/47 mmHg (02/25 1037) Pulse Rate: 69 (02/25 1037)  Labs:  Recent Labs  12/05/14 1929 12/07/14 1708 12/07/14 2012 12/08/14 0235 12/08/14 0850  HGB 17.7 17.2*  --   --   --   HCT 52.8 48.6  --   --   --   PLT  --  198  --   --   --   LABPROT 27.3*  --  37.8* 40.9*  --   INR 2.53*  --  3.81* 4.21*  --   CREATININE 1.19 3.37*  --   --  3.90*  TROPONINI  --   --  <0.03 <0.03 <0.03    Estimated Creatinine Clearance: 21 mL/min (by C-G formula based on Cr of 3.9).  Assessment: 66 y/oM with PMH of atrial fibrillation, lymphoma, diverticulosis, HTN, CVA, HLD, esophageal stricture, CHB, ICM who presents with ataxia, n/v, and vertigo. Neurology consulted and recommended CVA workup as outpatient, but now being admitted due to ARF and workup to be completed while patient at [facility]. CT head negative x 2 for acute intracranial abnormality but posterior lesion maybe poorly visiualized  Pharmacy consulted to assist with warfarin dosing while patient in the hospital.  Home warfarin dose: 2.5mg  daily.  Significant events: 2/24: INR supratherapeutic on admission. No Coumadin.  Today, 12/08/2014: INR rose to 4.21 Hgb/Hct, Pltc WNL No bleeding issues reported Tolerating HH diet On home amiodarone and aspirin  Goal of Therapy:  INR 2-3 Monitor platelets by anticoagulation protocol: Yes   Plan:  No Coumadin today.  Daily PT/INR CBC at least q72h while inpatient. Monitor for signs/symptoms of bleeding.  Romeo Rabon, PharmD, pager [phone]. 12/08/2014,1:22 PM.

## 2014-12-08 NOTE — Telephone Encounter (Signed)
Pt has St Jude 1210 Accent SR RF.  Indication on Specsheet: "Magnetic Resonance Imaging (MRI). MRI for patients with implantable devices has been contraindicated by MRI manufacturers. Clinicians should carefully weigh the decisions to use MRI with pacemaker patients. Additional safety concerns include: - Magnetic and RF fields produced by MRI may increase pacing rate, inhibit pacing, cause  asynchronous pacing or result in pacing at random rates. - MRI may result in changes in capture thresholds due to heating of pacing leads. - MRI may irreversibly damage the device. - Patients should be closely monitored during the MRI. - Assess the device function before and after exposure to MRI."  Patient does not have anything scheduled, but daughter wanted to know for future reference as he was recently hospitalized for abnormal neurological concerns.

## 2014-12-09 ENCOUNTER — Telehealth: Payer: Self-pay

## 2014-12-09 DIAGNOSIS — I255 Ischemic cardiomyopathy: Secondary | ICD-10-CM

## 2014-12-09 LAB — GLUCOSE, CAPILLARY
GLUCOSE-CAPILLARY: 157 mg/dL — AB (ref 70–99)
Glucose-Capillary: 98 mg/dL (ref 70–99)
Glucose-Capillary: 94 mg/dL (ref 70–99)
Glucose-Capillary: 105 mg/dL — ABNORMAL HIGH (ref 70–99)

## 2014-12-09 LAB — HEMOGLOBIN A1C
HEMOGLOBIN A1C: 5.7 % — AB (ref 4.8–5.6)
Mean Plasma Glucose: 117 mg/dL

## 2014-12-09 LAB — BASIC METABOLIC PANEL
Anion gap: 7 (ref 5–15)
BUN: 30 mg/dL — AB (ref 6–23)
CO2: 27 mmol/L (ref 19–32)
Calcium: 8.2 mg/dL — ABNORMAL LOW (ref 8.4–10.5)
Chloride: 108 mmol/L (ref 96–112)
Creatinine, Ser: 3.55 mg/dL — ABNORMAL HIGH (ref 0.50–1.35)
GFR, EST AFRICAN AMERICAN: 19 mL/min — AB (ref >90)
GFR, EST NON AFRICAN AMERICAN: 16 mL/min — AB (ref >90)
Glucose, Bld: 98 mg/dL (ref 70–99)
Potassium: 4 mmol/L (ref 3.5–5.1)
Sodium: 142 mmol/L (ref 135–145)

## 2014-12-09 LAB — PROTIME-INR
INR: 4.54 — AB (ref 0.00–1.49)
PROTHROMBIN TIME: 43.4 s — AB (ref 11.6–15.2)

## 2014-12-09 MED ORDER — POLYETHYLENE GLYCOL 3350 17 G PO PACK
17.0000 g | PACK | Freq: Every day | ORAL | Status: DC
  Administered 2014-12-09 – 2014-12-10 (×2): 17 g via ORAL
  Filled 2014-12-09 (×2): qty 1

## 2014-12-09 NOTE — Evaluation (Signed)
Occupational Therapy Evaluation Patient Details Name: Benjamin Mckay MRN: [id number] DOB: January 23, 1946 Today's Date: 12/09/2014    History of Present Illness 69 y.o. male with past medical history of Atrial fibrillation; Lymphoma (1998); Diverticulosis; Esophageal stricture; Hyperlipidemia; Hypertension; CVA (cerebral infarction); Colon polyps; Urinary frequency; Umbilical hernia; Congenital heart block; CHB (complete heart block) (11/06/2013); and Cardiomyopathy admitted with ataxic gait & transient cerebral ischemia    Clinical Impression   Pt admitted as above whom presents w/ intermittent c/o feeling light headed (orthostatics negative), impacting his ability to perform ADL's and functional mobility related to ADL's. He is able to perform LB dressing sitting and then is supervision sit to stand for transfers secondary to c/o as mentioned. He should benefit from continued acute OT to address impairements w/ ADL's and functional mobility at this time, he should progress quickly as he was independent prior to this hospitalization.     Follow Up Recommendations  No OT follow up;Supervision - Intermittent    Equipment Recommendations  None recommended by OT    Recommendations for Other Services       Precautions / Restrictions Precautions Precautions: None Precaution Comments: pt denies falls in past year Restrictions Weight Bearing Restrictions: No      Mobility Bed Mobility Overal bed mobility: Modified Independent                Transfers Overall transfer level: Needs assistance Equipment used: Rolling walker (2 wheeled) Transfers: Sit to/from [employer] Transfers Sit to Stand: Supervision Stand pivot transfers: Supervision       General transfer comment: supervision for balance due to mild dizziness reported in standing -orthostatics negative (see flowsheets)    Balance                                            ADL Overall ADL's :  Needs assistance/impaired Eating/Feeding: Independent;Sitting;Bed level   Grooming: Wash/dry hands;Wash/dry face;Oral care;Modified independent;Set up;Sitting   Upper Body Bathing: Modified independent;Sitting   Lower Body Bathing: Set up;Sit to/from stand   Upper Body Dressing : Modified independent;Sitting   Lower Body Dressing: Supervision/safety;Sit to/from stand Lower Body Dressing Details (indicate cue type and reason): Pt able to I'ly don/doff socks sitting at EOB and crossing one leg over the other, currently recommend supervision secondary to intermittent c/o feeling "light headed" and high INR of 4.5 today. Toilet Transfer: Supervision/safety;RW;Regular Toilet;Grab bars   Toileting- Water quality scientist and Hygiene: Supervision/safety;Sit to/from stand       Functional mobility during ADLs: Supervision/safety;Rolling walker General ADL Comments: Pt is currently w/ intermittent c/o feeling light headed (orthostatics negative), please see orthostatic vitals for details. He is able to perform LB dressing sitting and then is supervision sit to stand for transfers secondary to c/o as mentioned. He should benefit from continued acute OT to address impairements w/ ADL's and functional mobility at this time, he should progress quickly as he was independent prior to this hospitalization      Vision  No change from baseline   Perception     Praxis      Pertinent Vitals/Pain Pain Assessment: No/denies pain     Hand Dominance Right   Extremity/Trunk Assessment Upper Extremity Assessment Upper Extremity Assessment: Overall WFL for tasks assessed   Lower Extremity Assessment Lower Extremity Assessment: Defer to PT evaluation;Overall [facility] for tasks assessed   Cervical / Trunk Assessment Cervical / Trunk  Assessment: Normal   Communication Communication Communication: No difficulties   Cognition Arousal/Alertness: Awake/alert Behavior During Therapy: WFL for tasks  assessed/performed Overall Cognitive Status: Within Functional Limits for tasks assessed                     General Comments       Exercises       Shoulder Instructions      Home Living Family/patient expects to be discharged to:: Private residence Living Arrangements: Spouse/significant other Available Help at Discharge: Family;Available PRN/intermittently Type of Home: House       Home Layout: Two level Alternate Level Stairs-Number of Steps: flight   Bathroom Shower/Tub: Teacher, early years/pre: Standard     Home Equipment: None          Prior Functioning/Environment Level of Independence: Independent        Comments: pt drives, independently bathes in  tub/shower, can make his own simple meals, walks without AD    OT Diagnosis: Other (comment) (Dx ataxia; c/o mild dizziness in standing - orthostatics negative.)   OT Problem List: Decreased activity tolerance;Decreased knowledge of use of DME or AE   OT Treatment/Interventions: Self-care/ADL training;DME and/or AE instruction;Patient/family education;Therapeutic activities    OT Goals(Current goals can be found in the care plan section) Acute Rehab OT Goals Patient Stated Goal: To play golf Time For Goal Achievement: 12/23/14 Potential to Achieve Goals: Good  OT Frequency: Min 2X/week   Barriers to D/C:            Co-evaluation   Reason for Co-Treatment: For patient/therapist safety PT goals addressed during session: Mobility/safety with mobility;Proper use of DME        End of Session Equipment Utilized During Treatment: Rolling walker  Activity Tolerance: Patient tolerated treatment well Patient left: in chair;with call bell/phone within reach;with chair alarm set   Time: 1040-1107 OT Time Calculation (min): 27 min Charges:  OT General Charges $OT Visit: 1 Procedure OT Evaluation $Initial OT Evaluation Tier I: 1 Procedure OT Treatments $Self Care/Home Management :  8-22 mins G-Codes:    Maebell Lyvers Beth Dixon, OTR/L 12/09/2014, 11:30 AM

## 2014-12-09 NOTE — Progress Notes (Signed)
ANTICOAGULATION CONSULT NOTE - Follow up  Pharmacy Consult for Warfarin Indication: atrial fibrillation  Allergies  Allergen Reactions  . Statins     Leg cramps.   Tolerates pravastatin.    Marland Kitchen Penicillins Rash    Patient Measurements: Height: 5\' 9"  (175.3 cm) Weight: 224 lb 10.4 oz (101.9 kg) IBW/kg (Calculated) : 70.7  Vital Signs: Temp: 97.6 F (36.4 C) (02/26 0525) Temp Source: Oral (02/26 0525) BP: 113/63 mmHg (02/26 0913) Pulse Rate: 68 (02/26 0913)  Labs:  Recent Labs  12/07/14 1708 12/07/14 2012 12/08/14 0235 12/08/14 0850 12/09/14 0530  HGB 17.2*  --   --   --   --   HCT 48.6  --   --   --   --   PLT 198  --   --   --   --   LABPROT  --  37.8* 40.9*  --  43.4*  INR  --  3.81* 4.21*  --  4.54*  CREATININE 3.37*  --   --  3.90* 3.55*  TROPONINI  --  <0.03 <0.03 <0.03  --     Estimated Creatinine Clearance: 23.4 mL/min (by C-G formula based on Cr of 3.55).  Assessment: 1 y/oM with PMH of atrial fibrillation, lymphoma, diverticulosis, HTN, CVA, HLD, esophageal stricture, CHB, ICM who presents with ataxia, n/v, and vertigo. Neurology consulted and recommended CVA workup as outpatient, but now being admitted due to ARF and workup to be completed while patient at [facility]. CT head negative x 2 for acute intracranial abnormality but posterior lesion maybe poorly visiualized  Pharmacy consulted to assist with warfarin dosing while patient in the hospital.  Home warfarin dose: 2.5mg  daily.  Significant events: 2/24: INR supratherapeutic on admission. No Coumadin. 2/25: INR remains supratherapeutic. No Coumadin.  Today, 12/09/2014: INR still rising 2/24 - Hgb/Hct, Pltc WNL No bleeding issues reported Tolerating HH diet On home amiodarone and aspirin  Goal of Therapy:  INR 2-3 Monitor platelets by anticoagulation protocol: Yes   Plan:  No Coumadin today.  Daily PT/INR CBC at least q72h while inpatient. Monitor for signs/symptoms of bleeding.  Romeo Rabon,  PharmD, pager [phone]. 12/09/2014,12:32 PM.

## 2014-12-09 NOTE — Evaluation (Addendum)
Physical Therapy Evaluation Patient Details Name: Benjamin Mckay MRN: [id number] DOB: 1946-01-05 Today's Date: 12/09/2014   History of Present Illness  69 y.o. male with past medical history of Atrial fibrillation; Lymphoma (1998); Diverticulosis; Esophageal stricture; Hyperlipidemia; Hypertension; CVA (cerebral infarction); Colon polyps; Urinary frequency; Umbilical hernia; Congenital heart block; CHB (complete heart block) (11/06/2013); and Cardiomyopathy admitted with ataxic gait. Pt found to have acute renal failure. CT of head negative for acute infarct.   Clinical Impression  Pt admitted with above diagnosis. Pt currently with functional limitations due to the deficits listed below (see PT Problem List). Pt will benefit from skilled PT to increase their independence and safety with mobility to allow discharge to the venue listed below.    Pt pivoted bed to recliner with RW and supervision. He reports mild dizziness in standing but is not orthostatic (see flowsheets for orthostatic BPs). Will assess gait when INR comes down. *    Follow Up Recommendations  (TBD following assessment of gait)    Equipment Recommendations   (TBD)    Recommendations for Other Services       Precautions / Restrictions Precautions Precautions: None Precaution Comments: pt denies falls in past year Restrictions Weight Bearing Restrictions: No      Mobility  Bed Mobility Overal bed mobility: Modified Independent                Transfers Overall transfer level: Needs assistance Equipment used: Rolling walker (2 wheeled) Transfers: Sit to/from [employer] Transfers Sit to Stand: Supervision Stand pivot transfers: Supervision       General transfer comment: supervision for balance due to mild dizziness reported in standing -orthostatics negative (see flowsheets)  Ambulation/Gait             General Gait Details: gait deferred due to elevated INR of 4.5  Stairs             Wheelchair Mobility    Modified Rankin (Stroke Patients Only)       Balance                                             Pertinent Vitals/Pain Pain Assessment: No/denies pain    Home Living Family/patient expects to be discharged to:: Private residence Living Arrangements: Spouse/significant other Available Help at Discharge: Family;Available PRN/intermittently Type of Home: House       Home Layout: Two level Home Equipment: None      Prior Function Level of Independence: Independent         Comments: pt drives, independently bathes in  tub/shower, can make his own simple meals, walks without AD     Hand Dominance        Extremity/Trunk Assessment   Upper Extremity Assessment: Defer to OT evaluation           Lower Extremity Assessment: Overall WFL for tasks assessed      Cervical / Trunk Assessment: Normal  Communication   Communication: No difficulties  Cognition Arousal/Alertness: Awake/alert Behavior During Therapy: WFL for tasks assessed/performed Overall Cognitive Status: Within Functional Limits for tasks assessed                      General Comments      Exercises        Assessment/Plan    PT Assessment Patient needs continued PT services  PT Diagnosis  Abnormality of gait   PT Problem List Decreased mobility;Decreased balance  PT Treatment Interventions Gait training;Functional mobility training;Therapeutic activities;Balance training   PT Goals (Current goals can be found in the Care Plan section) Acute Rehab PT Goals Patient Stated Goal: to play golf PT Goal Formulation: With patient Time For Goal Achievement: 12/23/14 Potential to Achieve Goals: Good    Frequency Min 3X/week   Barriers to discharge        Co-evaluation PT/OT/SLP Co-Evaluation/Treatment: Yes Reason for Co-Treatment: For patient/therapist safety PT goals addressed during session: Mobility/safety with mobility;Proper use  of DME         End of Session Equipment Utilized During Treatment: Gait belt Activity Tolerance: Patient tolerated treatment well Patient left: in chair;with call bell/phone within reach;with chair alarm set Nurse Communication: Mobility status         Time: 1040-1058 PT Time Calculation (min) (ACUTE ONLY): 18 min   Charges:   PT Evaluation $Initial PT Evaluation Tier I: 1 Procedure     PT G CodesPhilomena Doheny 12/09/2014, 11:10 AM [phone]

## 2014-12-09 NOTE — Telephone Encounter (Signed)
-----   Message from Candis Schatz sent at 12/09/2014 12:59 PM EST ----- TOC going home tomorrow. Will see Katie on Tuesday. Please have someone call on Monday

## 2014-12-09 NOTE — Progress Notes (Signed)
PROGRESS NOTE  [facility] IWL:[id number] DOB: 13-Dec-1945 DOA: 12/07/2014 PCP: Pcp Not In System  HPI: Benjamin Mckay is a 69 y.o. male has a past medical history of Atrial fibrillation; Lymphoma (1998); Diverticulosis; Esophageal stricture; Hyperlipidemia; Hypertension; CVA (cerebral infarction); Colon polyps; Urinary frequency; Umbilical hernia; Congenital heart block; CHB (complete heart block) (11/06/2013); and Cardiomyopathy, ischemic (11/07/2013). Presented with Since Saturday AM patient developed ataxia worse when tried to ambulate, He vae had two episodes of vomiting, no headache. No localized weakness, no slurred speech. Patient was worried about nausea and discontinued eating or drinking for past 4 days. Only had some Gatorade today. Neurology seen him and recommended CVA work up as outpatient. He is unanble to get MRI as he is sp pacemaker for complete heart block. Routine labs showed ARF Hospitalist was called for admission. Patient on chronic coumadin with hx of A.fib. Echo from 2014 showing EF of 35-40%  Subjective / 24 H Interval events - symptomatically improved  Assessment/Plan: Active Problems:   Non-Hodgkin's lymphoma of intestine   Transient cerebral ischemia   HLD   HTN (hypertension)   Renal artery stenosis   CAD (coronary artery disease)   Atrial fibrillation, permanent   Long term current use of anticoagulant therapy   CHB (complete heart block)   Single chamber St. Jude pacemaker 2013   Cardiomyopathy, ischemic   ARF (acute renal failure)   Dehydration   Warfarin anticoagulation   Ataxia   Dizziness   On amiodarone therapy   Carotid artery disease   Generalized weakness, nausea,? Ataxia - neurology consulted  - CT head was negative for acute stroke, patient cannot have an MRI due to the presence of pacemaker  Systolic heart failure due to ischemic cardiomyopathy  - 2-D echo as below - cardiology following, appreciate input.  Acute renal failure  -likely in the setting of hypotension, improving with fluids - discontinue fluids today per cardiology as patient eating and drinking regularly  Single chamber St. Jude pacemaker 2013 -stable  HTN (hypertension) - hold home antihypertensives.   Atrial fibrillation, permanent / Complete heart block / - now paced, discontinue Metoprolol  CAD (coronary artery disease) -stable no chest pain, cont aspirin, statin and betablocker   Diet: Diet Heart Fluids: none DVT Prophylaxis: Coumadin  Code Status: Full Code Family Communication: d/w patient  Disposition Plan: remain inpatient   Consultants:  Neurology  Cardiology   Procedures:  None    Antibiotics  Anti-infectives    None       Studies  Dg Chest 2 View  12/07/2014   CLINICAL DATA:  Dizziness for 4 days. History of atrial fibrillation.  EXAM: CHEST  2 VIEW  COMPARISON:  02/07/2012  FINDINGS: Left subclavian approach 3 lead pacemaker remains in place, with a fractured lead again noted. The cardiac silhouette remains mildly enlarged. Increased density in the right lung base is unchanged corresponding to mildly prominent pulmonary vessels and superimposed costochondral calcification. The lungs are well inflated without evidence of airspace consolidation, edema, pleural effusion, or pneumothorax. No acute osseous abnormality is identified.  IMPRESSION: No active cardiopulmonary disease.   Electronically Signed   By: Logan Bores   On: 12/07/2014 17:36   Ct Head Wo Contrast  12/07/2014   CLINICAL DATA:  Dizziness beginning Saturday. Went to urgent care on Monday. CT was performed and reportedly negative. Patient returned urgent care today for followup. Dizziness continued. Patient was sent tumor is to department.  EXAM: CT HEAD WITHOUT CONTRAST  TECHNIQUE:  Contiguous axial images were obtained from the base of the skull through the vertex without intravenous contrast.  COMPARISON:  12/05/2014  FINDINGS: Mild cerebral atrophy. No  ventricular dilatation. Mild white matter changes suggesting small vessel ischemia. Old lacunar infarct in the cerebellar hemispheres bilaterally. No change since previous study. No mass effect or midline shift. No abnormal extra-axial fluid collections. Gray-white matter junctions are distinct. Basal cisterns are not effaced. No evidence of acute intracranial hemorrhage. No depressed skull fractures. Visualized paranasal sinuses and mastoid air cells are not opacified.  IMPRESSION: No acute intracranial abnormalities. Mild chronic atrophy and small vessel ischemic changes   Electronically Signed   By: Lucienne Capers M.D.   On: 12/07/2014 17:30    Objective  Filed Vitals:   12/09/14 0144 12/09/14 0525 12/09/14 0822 12/09/14 0913  BP: 108/49 104/52  113/63  Pulse: 68 68  68  Temp: 97.7 F (36.5 C) 97.6 F (36.4 C)    TempSrc: Oral Oral    Resp: 16 16  16   Height:   5\' 9"  (1.753 m)   Weight:   101.9 kg (224 lb 10.4 oz)   SpO2: 98% 99%  97%    Intake/Output Summary (Last 24 hours) at 12/09/14 1233 Last data filed at 12/09/14 1221  Gross per 24 hour  Intake 3513.33 ml  Output   1925 ml  Net 1588.33 ml   Filed Weights   12/07/14 2129 12/09/14 0822  Weight: 98.975 kg (218 lb 3.2 oz) 101.9 kg (224 lb 10.4 oz)   Exam:  General:  NAD  HEENT: no scleral icterus  Cardiovascular: RRR without MRG  Respiratory: CTA biL  Abdomen: soft, non tender  MSK/Extremities: no clubbing/cyanosis  Skin: no rashes   Neuro: non focal   Data Reviewed: Basic Metabolic Panel:  Recent Labs Lab 12/05/14 1929 12/07/14 1708 12/08/14 0850 12/09/14 0530  NA 137 135 136 142  K 4.7 3.8 3.5 4.0  CL 98 100 101 108  CO2 27 23 27 27   GLUCOSE 89 106* 119* 98  BUN 17 36* 37* 30*  CREATININE 1.19 3.37* 3.90* 3.55*  CALCIUM 9.6 8.9 8.5 8.2*   Liver Function Tests:  Recent Labs Lab 12/05/14 1929  AST 28  ALT 32  ALKPHOS 83  BILITOT 1.5*  PROT 7.6  ALBUMIN 4.2   CBC:  Recent Labs Lab  12/05/14 1929 12/07/14 1708  WBC 8.2 10.5  HGB 17.7 17.2*  HCT 52.8 48.6  MCV 99.1* 95.5  PLT  --  198   Cardiac Enzymes:  Recent Labs Lab 12/07/14 2012 12/08/14 0235 12/08/14 0850  TROPONINI <0.03 <0.03 <0.03   CBG:  Recent Labs Lab 12/08/14 1151 12/08/14 1639 12/08/14 2211 12/09/14 0728 12/09/14 1142  GLUCAP 138* 101* 114* 98 157*   Scheduled Meds: . amiodarone  200 mg Oral Daily  . aspirin  81 mg Oral Daily  . pantoprazole  40 mg Oral Daily  . polyethylene glycol  17 g Oral Daily  . pravastatin  80 mg Oral q1800  . Warfarin - Pharmacist Dosing Inpatient   Does not apply q1800   Continuous Infusions:    Marzetta Board, MD [facility] Pager [phone]. If 7 PM - 7 AM, please contact night-coverage at [email or web address], password [facility] 12/09/2014, 12:33 PM  LOS: 2 days

## 2014-12-09 NOTE — Progress Notes (Signed)
    Subjective:  Feels ok today, but hasn't been out of bed. No chest pain or shortness of breath.   Objective:  Vital Signs in the last 24 hours: Temp:  [97.5 F (36.4 C)-98 F (36.7 C)] 97.6 F (36.4 C) (02/26 0525) Pulse Rate:  [65-69] 68 (02/26 0913) Resp:  [13-18] 16 (02/26 0913) BP: (84-126)/(46-86) 113/63 mmHg (02/26 0913) SpO2:  [95 %-99 %] 97 % (02/26 0913) Weight:  [224 lb 10.4 oz (101.9 kg)] 224 lb 10.4 oz (101.9 kg) (02/26 9233)  Intake/Output from previous day: 02/25 0701 - 02/26 0700 In: 3720 [P.O.:720; I.V.:2500; IV Piggyback:500] Out: 0076 [Urine:1575]  Physical Exam: Pt is alert and oriented, NAD HEENT: normal Neck: JVP - normal Lungs: CTA bilaterally CV: RRR without murmur or gallop Abd: soft, NT, Positive BS, no hepatomegaly Ext: no C/C/E, distal pulses intact and equal Skin: warm/dry no rash   Lab Results:  Recent Labs  12/07/14 1708  WBC 10.5  HGB 17.2*  PLT 198    Recent Labs  12/08/14 0850 12/09/14 0530  NA 136 142  K 3.5 4.0  CL 101 108  CO2 27 27  GLUCOSE 119* 98  BUN 37* 30*  CREATININE 3.90* 3.55*    Recent Labs  12/08/14 0235 12/08/14 0850  TROPONINI <0.03 <0.03    Cardiac Studies: 2D Echo: Study Conclusions  - Left ventricle: The cavity size was normal. Wall thickness was increased in a pattern of moderate to severe LVH. Systolic function was moderately to severely reduced. The estimated ejection fraction was in the range of 30% to 35%. - Ventricular septum: Septal motion showed abnormal function and dyssynergy. These changes are consistent with right ventricular pacing. - Aortic valve: There was mild regurgitation. - Mitral valve: There was mild regurgitation. - Left atrium: The atrium was mildly to moderately dilated. - Right ventricle: The cavity size was mildly dilated. Wall thickness was normal.  Tele: Paced rhythm  Assessment/Plan:  1. Acute renal failure. Creatinine plateau and now  beginning to trend down. He is taking PO and I would recommend stopping his IV fluids today to avoid CHF exacerbation. Would hold diuretics and losartan at least another 48 hours. Would anticipate that he will be discharged over the weekend and we will arrange a cardiology follow-up visit with Dr Lurline Del PA/NP next week with a BMET to eval renal function.  2. Chronic systolic heart failure. Continue home meds except ARB and diuretic. Antihypertensives on hold for hypotension. Home meds reviewed. Would start metoprolol back tomorrow as BP slowly improving.  3. AFib - permanent. Paced rhythm. INR supratherapeutic. Hold warfarin.  Dispo: lab follow-up in am. Seems to be improving. Will arrange follow-up as outlined above. Anticipate resume beta-blocker tomorrow but hold diuretic and ARB until seen by cards next week. Please call if we can be of further help. Office will call him with appt times.   Sherren Mocha, M.D. 12/09/2014, 9:31 AM

## 2014-12-10 DIAGNOSIS — R270 Ataxia, unspecified: Secondary | ICD-10-CM

## 2014-12-10 DIAGNOSIS — Z8673 Personal history of transient ischemic attack (TIA), and cerebral infarction without residual deficits: Secondary | ICD-10-CM

## 2014-12-10 LAB — BASIC METABOLIC PANEL
ANION GAP: 7 (ref 5–15)
BUN: 22 mg/dL (ref 6–23)
CO2: 27 mmol/L (ref 19–32)
CREATININE: 2.55 mg/dL — AB (ref 0.50–1.35)
Calcium: 8.4 mg/dL (ref 8.4–10.5)
Chloride: 106 mmol/L (ref 96–112)
GFR calc Af Amer: 28 mL/min — ABNORMAL LOW (ref >90)
GFR calc non Af Amer: 24 mL/min — ABNORMAL LOW (ref >90)
Glucose, Bld: 106 mg/dL — ABNORMAL HIGH (ref 70–99)
POTASSIUM: 3.9 mmol/L (ref 3.5–5.1)
SODIUM: 140 mmol/L (ref 135–145)

## 2014-12-10 LAB — CBC
HCT: 41.3 % (ref 39.0–52.0)
Hemoglobin: 14 g/dL (ref 13.0–17.0)
MCH: 32.9 pg (ref 26.0–34.0)
MCHC: 33.9 g/dL (ref 30.0–36.0)
MCV: 96.9 fL (ref 78.0–100.0)
Platelets: 171 10*3/uL (ref 150–400)
RBC: 4.26 MIL/uL (ref 4.22–5.81)
RDW: 13.2 % (ref 11.5–15.5)
WBC: 6.3 10*3/uL (ref 4.0–10.5)

## 2014-12-10 LAB — PROTIME-INR
INR: 2.92 — AB (ref 0.00–1.49)
PROTHROMBIN TIME: 30.7 s — AB (ref 11.6–15.2)

## 2014-12-10 LAB — GLUCOSE, CAPILLARY: GLUCOSE-CAPILLARY: 101 mg/dL — AB (ref 70–99)

## 2014-12-10 NOTE — Progress Notes (Signed)
CARE MANAGEMENT NOTE 12/10/2014  Patient:  Benjamin Mckay, Benjamin Mckay   Account Number:  [id number]  Date Initiated:  12/10/2014  Documentation initiated by:  [facility]  Subjective/Objective Assessment:   Non-Hodgkin's lymphoma of intestine     Action/Plan:   Anticipated DC Date:  12/10/2014   Anticipated DC Plan:  [place]  CM consult      Choice offered to / List presented to:  C-1 Patient   DME arranged  Vassie Moselle      DME agency  [place] arranged  [place].   Status of service:  Completed, signed off Medicare Important Message given?  YES (If response is "NO", the following Medicare IM given date fields will be blank) Date Medicare IM given:  12/10/2014 Medicare IM given by:  [facility] Date Additional Medicare IM given:   Additional Medicare IM given by:    Discharge Disposition:  [place]  Per UR Regulation:    If discussed at Long Length of Stay Meetings, dates discussed:    Comments:  12/10/2014 11:16 AM NCM spoke to pt and offered choice for [facility]. Pt requested [facility] for HH, notified [facility] rep. Contacted [facility] DME rep for RW for home. Jonnie Finner RN CCM Case Mgmt phone [phone]

## 2014-12-10 NOTE — Progress Notes (Signed)
Went over all discharge information with pt and family. All questions answered.  Waiting for advanced home care to bring walker.  Will wheel out pt once ready to go.

## 2014-12-10 NOTE — Progress Notes (Signed)
NCM spoke to pt and offered choice for [facility]. Pt requested [facility] for HH, notified [facility] rep. Contacted [facility] DME rep for RW for home. Jonnie Finner RN CCM Case Mgmt phone [phone]

## 2014-12-10 NOTE — Progress Notes (Signed)
Physical Therapy Treatment Patient Details Name: Benjamin Mckay MRN: [id number] DOB: [birth year] Today's Date: 12/10/2014    History of Present Illness 69 y.o. male with past medical history of Atrial fibrillation; Lymphoma (1998); Diverticulosis; Esophageal stricture; Hyperlipidemia; Hypertension; CVA (cerebral infarction); Colon polyps; Urinary frequency; Umbilical hernia; Congenital heart block; CHB (complete heart block) (11/06/2013); and Cardiomyopathy admitted with ataxic gait & transient cerebral ischemia     PT Comments    Pt is progressing well but with higher level balance deficits and would benefit from OPPT, if transportation unavailable then recommend HHPT; will need RW initially for incr safety and fall prevention.  Follow Up Recommendations  Outpatient PT;Supervision for mobility/OOB     Equipment Recommendations  Rolling walker with 5" wheels    Recommendations for Other Services       Precautions / Restrictions Precautions Precautions: Fall Precaution Comments: pt denies falls in past year    Mobility  Bed Mobility Overal bed mobility: Modified Independent                Transfers Overall transfer level: Needs assistance Equipment used: Rolling walker (2 wheeled);None Transfers: Sit to/from Stand Sit to Stand: Supervision         General transfer comment: cues for safe hand placement  Ambulation/Gait Ambulation/Gait assistance: Supervision;Min guard;Min assist Ambulation Distance (Feet): 200 Feet (x2) Assistive device: Rolling walker (2 wheeled);None Gait Pattern/deviations: Step-through pattern;Drifts right/left;Trunk flexed (right lean)     General Gait Details: pt with LOB x2 during gait without RW, min to min/guard for balance, delayed reactions, pt describes "fuzziness and needing time to re-focus" with head turns   Stairs            Wheelchair Mobility    Modified Rankin (Stroke Patients Only)       Balance Overall  balance assessment: Needs assistance Sitting-balance support: Feet supported;No upper extremity supported Sitting balance-Leahy Scale: Good   Postural control: Right lateral lean (very slight in sitting)   Standing balance-Leahy Scale: Good   Single Leg Stance - Right Leg: 0 Single Leg Stance - Left Leg: 0 Tandem Stance - Right Leg: 0 Tandem Stance - Left Leg: 0 [place] - Eyes Opened: 60 (sec) [place] - Eyes Closed: 30 (sec) High level balance activites: Direction changes;Backward walking;Turns;Sudden stops;Head turns High Level Balance Comments: LOB x 2 with min  to recover with high level balance challenges; delayed reactions, immediate steppage response with tandem stance    Cognition Arousal/Alertness: Awake/alert Behavior During Therapy: WFL for tasks assessed/performed Overall Cognitive Status: Within Functional Limits for tasks assessed                      Exercises      General Comments        Pertinent Vitals/Pain Pain Assessment: No/denies pain    Home Living Family/patient expects to be discharged to:: Private residence Living Arrangements: Spouse/significant other Available Help at Discharge: Family;Available PRN/intermittently Type of Home: House Home Access: Stairs to enter   Home Layout: Two level;Bed/bath upstairs Home Equipment: None      Prior Function Level of Independence: Independent      Comments: pt drives, independently bathes in  tub/shower, can make his own simple meals, walks without AD   PT Goals (current goals can now be found in the care plan section) Acute Rehab PT Goals Patient Stated Goal: To play golf PT Goal Formulation: With patient Time For Goal Achievement: 12/23/14 Potential to Achieve Goals: Good Progress towards PT goals:  Progressing toward goals    Frequency  Min 3X/week    PT Plan Current plan remains appropriate    Co-evaluation             End of Session Equipment Utilized During Treatment:  Gait belt Activity Tolerance: Patient tolerated treatment well Patient left: in bed;with call bell/phone within reach;with family/visitor present     Time: 0964-3838 PT Time Calculation (min) (ACUTE ONLY): 16 min  Charges:  $Gait Training: 8-22 mins                    G Codes:      Claudene Gatliff December 21, 2014, 10:18 AM

## 2014-12-10 NOTE — Discharge Summary (Addendum)
Physician Discharge Summary  Benjamin Mckay [facility] JXB:[id number] DOB: Dec 30, 1945 DOA: 12/07/2014  PCP: Pcp Not In System  Admit date: 12/07/2014 Discharge date: 12/10/2014  Time spent: 35 minutes  Recommendations for Outpatient Follow-up:  1. Follow up with Neurology in 3 days as scheduled 2. Follow up with Cardiology in 4 days as scheduled 3. Hold Cozaar and Maxzide until seen in Cardiology office 4. Daily weights 5. Recommend repeat BMP in 3-4 days   Discharge Diagnoses:  Active Problems:   Non-Hodgkin's lymphoma of intestine   Transient cerebral ischemia   HLD   HTN (hypertension)   Renal artery stenosis   CAD (coronary artery disease)   Atrial fibrillation, permanent   Long term current use of anticoagulant therapy   CHB (complete heart block)   Single chamber St. Jude pacemaker 2013   Cardiomyopathy, ischemic   ARF (acute renal failure)   Dehydration   Warfarin anticoagulation   Ataxia   Dizziness   On amiodarone therapy   Carotid artery disease  Discharge Condition: stable  Diet recommendation: heart healthy  Filed Weights   12/07/14 2129 12/09/14 0822 12/10/14 0413  Weight: 98.975 kg (218 lb 3.2 oz) 101.9 kg (224 lb 10.4 oz) 102.649 kg (226 lb 4.8 oz)   History of present illness:  Benjamin Mckay is a 69 y.o. male has a past medical history of Atrial fibrillation; Lymphoma (1998); Diverticulosis; Esophageal stricture; Hyperlipidemia; Hypertension; CVA (cerebral infarction); Colon polyps; Urinary frequency; Umbilical hernia; Congenital heart block; CHB (complete heart block) (11/06/2013); and Cardiomyopathy, ischemic (11/07/2013).   Since Saturday AM patient developed ataxia worse when tried to ambulate, He vae had two episodes of vomiting, no headache. No localized weakness, no slurred speech. Patient was worried about nausea and discontinued eating or drinking for past 4 days. Only had some Gatorade today. Neurology seen him and recommended CVA work up as  outpatient. He is unanble to get MRI as he is sp pacemaker for complete heart block. Routine labs showed ARF Hospitalist was called for admission. Patient on chronic coumadin with hx of A.fib. Echo from 2014 showing EF of 35-40%  Hospital Course:  Generalized weakness, nausea, Ataxia/dizziness - neurology was consulted out of concern for CVA, and patient underwent a CT scan of the head which was negative for acute stroke. He is on Coumadin already. We were unable to obtain an MRI due to the presence of pacemaker. He underwent the rest of the workup with a 2-D echo which showed ejection fraction to 30-35% (full read below). He also had carotid duplex which showed his known stenosis on right. This can be further addressed as an outpatient. Patient was still complaining of mild balance issues after dehydration has resolved, physical therapy evaluated him and recommended home health PT which was arranged prior to patient's discharge.  Systolic heart failure due to ischemic cardiomyopathy - 2-D echo as below, cardiology was consulted during patient's hospitalization. Patient was hydrated since he was found to be hypovolemic on admission, his fluids were discontinued on 2/26 and has remained stable since.  Acute renal failure -likely in the setting of ? Gastroenteritis with nausea and vomiting, has had poor by mouth intake over the last 4-5 days prior to coming to the hospital, as well as he continue taking all of his home antihypertensives and diuretics. He was quite hypotensive on arrival, however responded well to fluids and his renal function started to improve on discharge. Patient was able to tolerate the diet and was advised to hold his  diuretic as well as ARB, he has close follow-up with radiology early next week and have repeat blood work at that time. Single chamber St. Jude pacemaker 2013 -stable HTN (hypertension) - hold home antihypertensives, he was initially hypotensive, improved with fluids. As  above, his diuretics and his ARB will be held on discharge Atrial fibrillation, permanent / Complete heart block / - now paced, discontinue Metoprolol. Continue Coumadin. CAD (coronary artery disease) -stable no chest pain, cont aspirin, statin and betablocker   Procedures:  Carotid dopplers Preliminary report: There is 60-79% right ICA stenosis. There is 1-39% left ICA stenosis. Vertebral artery flow is antegrade.    2D echo  Study Conclusions - Left ventricle: The cavity size was normal. Wall thickness was increased in a pattern of moderate to severe LVH. Systolic function was moderately to severely reduced. The estimatedejection fraction was in the range of 30% to 35%. Ventricular septum: Septal motion showed abnormal function anddyssynergy. These changes are consistent with right ventricularpacing. - Aortic valve: There was mild regurgitation. - Mitral valve: There was mild regurgitation. - Left atrium: The atrium was mildly to moderately dilated. - Right ventricle: The cavity size was mildly dilated. Wall thickness was normal.  Consultations:  Cardiology   Neurology  Discharge Exam: Filed Vitals:   12/09/14 1412 12/09/14 2055 12/09/14 2350 12/10/14 0413  BP: 124/70 130/77 123/73 136/76  Pulse: 67 71 68 66  Temp: 97.6 F (36.4 C) 98.2 F (36.8 C) 98.5 F (36.9 C) 98.3 F (36.8 C)  TempSrc: Oral Oral Oral Oral  Resp: 18 18 18 18   Height:      Weight:    102.649 kg (226 lb 4.8 oz)  SpO2: 99% 98% 98% 99%    General: NAD Cardiovascular: RRR Respiratory: CTA biL  Discharge Instructions     Medication List    STOP taking these medications        losartan 100 MG tablet  Commonly known as:  COZAAR     triamterene-hydrochlorothiazide 37.5-25 MG per tablet  Commonly known as:  MAXZIDE-25      TAKE these medications        ALPRAZolam 0.25 MG tablet  Commonly known as:  XANAX  Take 1-2 tablets (0.25-0.5 mg total) by mouth 3 (three) times daily as  needed for sleep (dizziness).     amiodarone 200 MG tablet  Commonly known as:  PACERONE  Take 1 tablet (200 mg total) by mouth daily.     aspirin 81 MG tablet  Take 81 mg by mouth daily.     FLOMAX 0.4 MG Caps capsule  Generic drug:  tamsulosin  Take 0.4 mg by mouth daily.     hydrOXYzine 10 MG tablet  Commonly known as:  ATARAX/VISTARIL  Take 10 mg by mouth 3 (three) times daily as needed.     metoprolol 50 MG tablet  Commonly known as:  LOPRESSOR  Take 1.5 tablets (75 mg total) by mouth 2 (two) times daily.     pantoprazole 40 MG tablet  Commonly known as:  PROTONIX  Take 1 tablet (40 mg total) by mouth daily.     pravastatin 80 MG tablet  Commonly known as:  PRAVACHOL  Take 1 tablet (80 mg total) by mouth daily.     promethazine 25 MG tablet  Commonly known as:  PHENERGAN  Take 1 tablet (25 mg total) by mouth every 8 (eight) hours as needed for nausea or vomiting.     warfarin 2.5 MG tablet  Commonly known  as:  COUMADIN  Take 2.5 mg by mouth daily at 6 PM.     warfarin 5 MG tablet  Commonly known as:  COUMADIN  Take 1-2.5 tablets (5-12.5 mg total) by mouth daily. Pt takes 1 tab on tue,wed,thu,sat,and sun. And takes 2 & 1/2 tabs on Mon, and Fri           Follow-up Information    Follow up with Pcp Not In System.   Why:  Please follow up with Neurology as scheduled.      Follow up with [name], MD In 3 days.   Specialty:  Family Medicine   Why:  For repeat bloodwork   Contact information:   [place] [address] [phone]       Follow up with Sanda Klein, MD In 1 week.   Specialty:  Cardiology   Contact information:   [address] [place] [address] [phone]       Follow up with [facility].   Why:  Home Health Physical Therapy   Contact information:   [address] [place] [address] [phone]       The results of significant diagnostics from this hospitalization  (including imaging, microbiology, ancillary and laboratory) are listed below for reference.    Significant Diagnostic Studies: Dg Chest 2 View  12/07/2014   CLINICAL DATA:  Dizziness for 4 days. History of atrial fibrillation.  EXAM: CHEST  2 VIEW  COMPARISON:  02/07/2012  FINDINGS: Left subclavian approach 3 lead pacemaker remains in place, with a fractured lead again noted. The cardiac silhouette remains mildly enlarged. Increased density in the right lung base is unchanged corresponding to mildly prominent pulmonary vessels and superimposed costochondral calcification. The lungs are well inflated without evidence of airspace consolidation, edema, pleural effusion, or pneumothorax. No acute osseous abnormality is identified.  IMPRESSION: No active cardiopulmonary disease.   Electronically Signed   By: Logan Bores   On: 12/07/2014 17:36   Ct Head Wo Contrast  12/07/2014   CLINICAL DATA:  Dizziness beginning Saturday. Went to urgent care on Monday. CT was performed and reportedly negative. Patient returned urgent care today for followup. Dizziness continued. Patient was sent tumor is to department.  EXAM: CT HEAD WITHOUT CONTRAST  TECHNIQUE: Contiguous axial images were obtained from the base of the skull through the vertex without intravenous contrast.  COMPARISON:  12/05/2014  FINDINGS: Mild cerebral atrophy. No ventricular dilatation. Mild white matter changes suggesting small vessel ischemia. Old lacunar infarct in the cerebellar hemispheres bilaterally. No change since previous study. No mass effect or midline shift. No abnormal extra-axial fluid collections. Gray-white matter junctions are distinct. Basal cisterns are not effaced. No evidence of acute intracranial hemorrhage. No depressed skull fractures. Visualized paranasal sinuses and mastoid air cells are not opacified.  IMPRESSION: No acute intracranial abnormalities. Mild chronic atrophy and small vessel ischemic changes   Electronically Signed    By: Lucienne Capers M.D.   On: 12/07/2014 17:30   Ct Head Wo Contrast  12/05/2014   CLINICAL DATA:  Dizziness, onset 3 days ago. Nausea and emesis. Hypertension and atrial fibrillation  EXAM: CT HEAD WITHOUT CONTRAST  TECHNIQUE: Contiguous axial images were obtained from the base of the skull through the vertex without intravenous contrast.  COMPARISON:  05/29/2010  FINDINGS: Small remote lacunar infarct observed in the left inferior cerebellum, image 5 series 2. Similar small right cerebellar lacunar infarct, image 8 series 2.  The brainstem, cerebral peduncle is,  thalami, basal ganglia, ventricular system, and basilar cisterns appear normal.  No intracranial hemorrhage, mass lesion, or acute CVA.  The middle ears and mastoid air cells appear normal bilaterally. The internal auditory canal is appear normal and symmetric. No asymmetry along the cerebellopontine angles.  IMPRESSION: 1. No significant intracranial abnormality to explain the patient's dizziness. 2. Several small remote lacunar infarcts are present in the cerebellum, unchanged from 2011.   Electronically Signed   By: Van Clines M.D.   On: 12/05/2014 21:20    Microbiology: No results found for this or any previous visit (from the past 240 hour(s)).   Labs: Basic Metabolic Panel:  Recent Labs Lab 12/05/14 1929 12/07/14 1708 12/08/14 0850 12/09/14 0530 12/10/14 0613  NA 137 135 136 142 140  K 4.7 3.8 3.5 4.0 3.9  CL 98 100 101 108 106  CO2 27 23 27 27 27   GLUCOSE 89 106* 119* 98 106*  BUN 17 36* 37* 30* 22  CREATININE 1.19 3.37* 3.90* 3.55* 2.55*  CALCIUM 9.6 8.9 8.5 8.2* 8.4   Liver Function Tests:  Recent Labs Lab 12/05/14 1929  AST 28  ALT 32  ALKPHOS 83  BILITOT 1.5*  PROT 7.6  ALBUMIN 4.2   CBC:  Recent Labs Lab 12/05/14 1929 12/07/14 1708 12/10/14 0613  WBC 8.2 10.5 6.3  HGB 17.7 17.2* 14.0  HCT 52.8 48.6 41.3  MCV 99.1* 95.5 96.9  PLT  --  198 171   Cardiac Enzymes:  Recent Labs Lab  12/07/14 2012 12/08/14 0235 12/08/14 0850  TROPONINI <0.03 <0.03 <0.03   CBG:  Recent Labs Lab 12/09/14 0728 12/09/14 1142 12/09/14 1626 12/09/14 2227 12/10/14 0749  GLUCAP 98 157* 94 105* 101*    Signed:  GHERGHE, COSTIN  [facility] 12/10/2014, 3:01 PM

## 2014-12-12 NOTE — Telephone Encounter (Signed)
Patient contacted regarding discharge from [facility] on December 10, 2014.  Patient understands to follow up with provider Angelena Form, PA on December 14, 2014 at 8:00AM at [facility]. Patient understands discharge instructions? yes Patient understands medications and regiment? yes Patient understands to bring all medications to this visit? yes

## 2014-12-13 ENCOUNTER — Ambulatory Visit (INDEPENDENT_AMBULATORY_CARE_PROVIDER_SITE_OTHER): Payer: Medicare Other | Admitting: Neurology

## 2014-12-13 ENCOUNTER — Encounter: Payer: Self-pay | Admitting: Neurology

## 2014-12-13 VITALS — BP 129/75 | HR 69 | Temp 96.9°F | Resp 18 | Ht 68.0 in | Wt 233.0 lb

## 2014-12-13 DIAGNOSIS — Z950 Presence of cardiac pacemaker: Secondary | ICD-10-CM

## 2014-12-13 DIAGNOSIS — R270 Ataxia, unspecified: Secondary | ICD-10-CM | POA: Diagnosis not present

## 2014-12-13 DIAGNOSIS — I639 Cerebral infarction, unspecified: Secondary | ICD-10-CM

## 2014-12-13 DIAGNOSIS — R351 Nocturia: Secondary | ICD-10-CM

## 2014-12-13 DIAGNOSIS — I482 Chronic atrial fibrillation, unspecified: Secondary | ICD-10-CM

## 2014-12-13 DIAGNOSIS — R0683 Snoring: Secondary | ICD-10-CM | POA: Diagnosis not present

## 2014-12-13 NOTE — Addendum Note (Signed)
Addended by: Star Age on: 12/13/2014 12:24 PM   Modules accepted: Level of Service

## 2014-12-13 NOTE — Progress Notes (Signed)
Cardiology Office Note   Date:  12/14/2014   ID:  LJ MIYAMOTO, DOB [birth year], MRN [id number]  PCP:  Pcp Not In System Cardiologist:  Dr. Sallyanne Kuster    Post hospital follow up for ARF.     History of Present Illness: Benjamin Mckay is a 69 y.o. male with a past medical history of atrial fibrillation on coumadin, lymphoma (1998), diverticulosis, esophageal stricture, HLD, HTN, previous CVA, CHB s/p PPM and ischemic cardiomyopathy who presents today for post hospital follow up after an admission for ARF and hypotension.   The patient was admitted to [facility] from 12/08/14-11/30/14. He presented with several days of ataxia and dizziness as well as n/v. He did not eat or drink for several days. He did continue to take all of his medications including his diuretic. When his dizziness and ataxia continued he came to the emergency room. Ultimately it was noted that he was in renal failure and was admitted for further evaluation. Several days before admission his creatinine was in the range of 1.1. It was noted to peak at 3.9 during his admission. He was treated with IVFs cautiously in the setting of his cardiomyopathy. He had a CT scan w/o contrast of the head that was then repeated 2 days later. This showed old cerebellar lacunar infarcts but no acute abnormalities. He could not have a MRI because of his pacemaker. Carotid Doppler study showed unchanged right carotid artery stenosis of 60-79%, left ICA was less than 40% stenotic. He improved with IVFs. He was given a 2 wheeled walker upon discharge from the hospital and home health physical therapy.   The patient's cardiac status is followed carefully by Dr. Sallyanne Kuster (Dr. Loletha Grayer). He has known coronary disease with moderate lesions in all major coronary territories but has no complaints of angina and had a low risk nuclear stress test in 2013. There is history of ischemic cardiomyopathy with an ejection fraction in the past of 35-40%. He's had CHB and has a  single-chamber pacemaker. He is pacemaker dependent with 100% ventricular pacing and no reliable underlying escape rhythm. His device is a single chamber St[employer] Jude device. His initial device was implanted in 1984 and he has had several generator change out's most recently with a generator change and a new ventricular lead performed in 2013. He takes amiodarone. Dr. Loletha Grayer suggested earlier this year that there was no longer an indication for the amiodarone. However the patient chose to continue to take it. He has moderate carotid disease. There is a history in the past of TIAs with lacunar infarcts. There is a history of renal artery stenosis, but he has never had severe hypertension related to it. Historically his volume has been controlled despite his left ventricular dysfunction. There is also a history of non-Hodgkin's lymphoma of the colon for which he had surgery. The patient has chronic atrial fib. He is on long-term Coumadin for this.   Today he presents for post hospital follow up. He continues to have some mild dizziness but is otherwise feeling well. No CP or SOB. He was seen yesterday for neurology follow up by Dr. Rexene Alberts who felt that he that he had an existing history of cerebellar strokes which showed up on his CAT scans and that his viral syndrome was exacerbating existing symptoms. In light of his nausea, vomiting and resultant hypovolemia, low blood pressure, and acute renal impairments, it seems more likely that he had an acute illness presenting with acute ataxia. She felt that  strongly that he should proceed with a sleep study for probable sleep apnea as he has a history of snoring, apneic episodes and daytime somnolence according to his wife. She ultimately recommended secondary stroke prevention, sleep study and supportive treatment for ataxia and vertigo. He is very reluctant to proceed with sleep study although he has been having sleep difficulties lately. He says he will think about it.      Past Medical History  Diagnosis Date  . Atrial fibrillation   . Lymphoma 1998    of colon   . Diverticulosis   . Esophageal stricture   . Hyperlipidemia   . Hypertension   . CVA (cerebral infarction)   . Colon polyps   . Urinary frequency   . Umbilical hernia   . Congenital heart block   . CHB (complete heart block) 11/06/2013  . Cardiomyopathy, ischemic 11/07/2013    Past Surgical History  Procedure Laterality Date  . Appendectomy  1953  . Colectomy      for lymphoma  . Pacemaker placement      replaced 3 x  . Tonsillectomy    . Neck fusion    . Back surgery    . Carotid doppler  11/04/2012    Proximal Rt ICA 50-99% diameter reduction; Lft Bulb demonstrated mild amount homogeneous plaque-not hemodynamically significant; Lft ICA-normal patency.  . Pacemaker generator change  01/31/2012    St Jude Med [employer] DR RF model [id number] serial [id number]  . Cardiac catheterization  01/06/2003    Recommend medical therapy  . Cardiovascular stress test  07/16/2012    Mild-moderate perfusion defect seen in Basal inferior, Mid inferior, and Apica lateral consistent with infarct/scar. No scintigraphic evidence for inducible myocardial ischemia. No ECG changes. EKG negative for ischemia.  . Transthoracic echocardiogram  11/04/2012    EF 09-73%, systolic function moderately reduced, mild regurg of the aortic and mitral valves.  . Permanent pacemaker generator change N/A 01/31/2012    Procedure: PERMANENT PACEMAKER GENERATOR CHANGE;  Surgeon: Sanda Klein, MD;  Location: [facility];  Service: Cardiovascular;  Laterality: N/A;     Current Outpatient Prescriptions  Medication Sig Dispense Refill  . ALPRAZolam (XANAX) 0.25 MG tablet Take 1-2 tablets (0.25-0.5 mg total) by mouth 3 (three) times daily as needed for sleep (dizziness). (Patient taking differently: Take 0.25-0.5 mg by mouth at bedtime as needed for sleep (dizziness). ) 20 tablet 0  . amiodarone (PACERONE) 200 MG tablet Take 1  tablet (200 mg total) by mouth daily. 90 tablet 3  . aspirin 81 MG tablet Take 81 mg by mouth daily.      . metoprolol (LOPRESSOR) 50 MG tablet Take 1.5 tablets (75 mg total) by mouth 2 (two) times daily. 270 tablet 3  . pantoprazole (PROTONIX) 40 MG tablet Take 1 tablet (40 mg total) by mouth daily. 90 tablet 3  . pravastatin (PRAVACHOL) 80 MG tablet Take 1 tablet (80 mg total) by mouth daily. 90 tablet 3  . Tamsulosin HCl (FLOMAX) 0.4 MG CAPS Take 0.4 mg by mouth daily.     Marland Kitchen warfarin (COUMADIN) 2.5 MG tablet Take 2.5 mg by mouth daily at 6 PM.    . zolpidem (AMBIEN) 10 MG tablet Take 1 tablet (10 mg total) by mouth at bedtime as needed for sleep. 7 tablet 0   No current facility-administered medications for this visit.    Allergies:   Statins and Penicillins    Social History:  The patient  reports that he has never smoked.  He does not have any smokeless tobacco history on file. He reports that he does not drink alcohol or use illicit drugs.   Family History:  The patient's family history includes Diabetes in his maternal grandmother; Heart disease in his mother; Prostate cancer in his father. There is no history of Colon cancer.    ROS:  Please see the history of present illness.   Otherwise, review of systems are positive for none.   All other systems are reviewed and negative.    PHYSICAL EXAM: VS:  BP 138/84 mmHg  Pulse 65  Ht 5\' 8"  (1.727 m)  Wt 234 lb (106.142 kg)  BMI 35.59 kg/m2  SpO2 98% , BMI Body mass index is 35.59 kg/(m^2). GEN: Well nourished, well developed, in no acute distress HEENT: normal Neck: no JVD, carotid bruits, or masses Cardiac: RRR; no murmurs, rubs, or gallops,no edema  Respiratory:  clear to auscultation bilaterally, normal work of breathing GI: soft, nontender, nondistended, + BS MS: no deformity or atrophy Skin: warm and dry, no rash Neuro:  Strength and sensation are intact Psych: euthymic mood, full affect   EKG:  EKG is not ordered  today.    Recent Labs: 12/05/2014: ALT 32; TSH 1.172 12/10/2014: BUN 22; Creatinine 2.55*; Hemoglobin 14.0; Platelets 171; Potassium 3.9; Sodium 140    Lipid Panel    Component Value Date/Time   CHOL 205* 12/08/2014 0235   TRIG 143 12/08/2014 0235   HDL 32* 12/08/2014 0235   CHOLHDL 6.4 12/08/2014 0235   VLDL 29 12/08/2014 0235   LDLCALC 144* 12/08/2014 0235      Wt Readings from Last 3 Encounters:  12/14/14 234 lb (106.142 kg)  12/13/14 233 lb (105.688 kg)  12/10/14 226 lb 4.8 oz (102.649 kg)      Other studies Reviewed: Additional studies/ records that were reviewed today include: Carotid dopplers, 2D ECHO Review of the above records demonstrates:  -- Carotid dopplers: 60-79% right ICA stenosis. There is 1-39% left ICA stenosis. Vertebral artery flow is antegrade.  -- 2D echo (12/08/14): mod-sev LVH. EF 30-35%. Ventricular septum: abnormal function anddyssynergy c/w RV pacing. Mild AR, mild MR, mod LA dilation. Mild RV dilation.   ASSESSMENT AND PLAN:  [place] SHELLHAMMER is a 69 y.o. male with a past medical history of atrial fibrillation on coumadin, lymphoma (1998), diverticulosis, esophageal stricture, HLD, HTN, previous CVA, CAD, CHB s/p PPM and ischemic cardiomyopathy who presents today for post hospital follow up after an admission for ARF and hypotension.   Acute renal failure- creat peaked at 3.9 in the hospital on 12/08/14. He was discharged on 12/10/14 with creat 2.55. Will check BMET today and if back to baseline we will restart his Losartan and Maxide. (baseline b/t 1.1-1.2)  Cardiomyopathy, ischemic: EF 30-35%. 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  -- NYHA class I, euvolemic.  -- Continue BB. ARB and Maxide held due to recent ARF and hypotension. Will check BMET today. If his kidney function has improved we will resume ARB and diuretic.   Atrial fibrillation- permanent. Paced  rhythm. -- He continues to take amiodarone although Dr. Loletha Grayer favors stopping it. He is still reluctant to make major changes in meds that he took for many years under Dr. Lowella Fairy tutelage. As long as he takes amiodarone he requires periodic liver function tests and thyroid function studies. Will defer stopping this to Dr. Loletha Grayer at next visit.  -- Continue warfarin  anticoagulation. INR was 2.92 on discharge (12/10/14). Will check INR today  CAD- Last coronary angiogram 2004 80% diagonal 1, 40% diagonal 2, 70% mid LAD after diagonal 2, 40% distal LAD, 40% proximal left circumflex, 50% OM1, 40%. -- No CP or SOB.  -- Continue ASA, statin and BB.  HLD -- Stable on pravastatin   Carotid artery disease-Stable on serial annual exams.  -- Dopplers on 12/08/14 w/ Right: moderate to severe mixed plaque origin ICA. 60-79% ICA stenosis. Left: mild mixed plaque origin ICA. 1-39% ICA stenosis. Bilateral vertebral artery flow is antegrade. ICA/CCA ratio: R-3.75 L-0.84.  Renal artery stenosis -- Hx of this without any associated HTN    CHB Pacemaker dependent w/ single chamber St. Jude pacemaker 2013 -- Normal device function, 100% VVIR pacing, estimated longevity 12 years, good lead parameters.  -- Set up for remote f/u. Set up for device check next month  Possible OSA- I strongly encouraged following up with sleep study as Dr. Rexene Alberts has recommended. He is reluctant but thinking it over.   Insomnia- he has had some insomnia since leaving the hospital and can't seem to get his sleeping back on track. He requests something to temporarily help him sleep. -- I provided 7 day Rx for ambien   Current medicines are reviewed at length with the patient today.  The patient does not have concerns regarding medicines.  The following changes have been made:  no change  Labs/ tests ordered today include:   Orders Placed This Encounter  Procedures  . Basic Metabolic Panel (BMET)  . INR/PT     Disposition:   FU  with Dr. Sallyanne Kuster in 4-6 weeks.  Renea Ee  12/14/2014 8:51 AM    [facility] [address], [place], [place]  [address] Phone: [phone]; Fax: [phone]

## 2014-12-13 NOTE — Progress Notes (Addendum)
Subjective:    Patient ID: Benjamin Mckay is a 69 y.o. male.  HPI     Star Age, MD, PhD [facility] [address], [address] [place], [place] [address]  Dear Dr. Brigitte Pulse,   I saw your patient, [name], upon your kind request in my neurologic clinic today for initial consultation of his gait disorder. The patient is accompanied by his wife today. As you know, [name] is a 53 year old right-handed gentleman with an underlying complex medical history of atrial fibrillation, lymphoma diagnosed in 1998, diverticulosis, esophageal stricture, hyperlipidemia, hypertension, stroke, congenital  heart disease and complete heart block, status post pacemaker placement, and ischemic  cardiomyopathy, who has had gait and balance problems for about a week. Symptoms started suddenly, when he was a ball game for his grandson, he felt a sensation of full headedness but not faint. He did sit down and broke out in a cold sweat. He went to the bathroom and had to throw up and actually felt a little better after that. He did not have any chest pain or shortness of breath. Thankfully he did not fall. He presented to the emergency room recently was hospitalized from 12/07/2014 through 12/10/2014 for changes in his gait and balance. He developed sudden onset of gait instability, nausea and vomiting. He had no focal weakness and no slurring of speech and for fear of nausea and vomiting he reduced his oral intake and was drinking Gatorade. He is on Coumadin for his A. fib. Head CT without contrast was negative for an acute stroke. Because of his pacemaker he cannot have an MRI. Echocardiogram showed EF of 30-35%. Carotid Doppler study showed unchanged right carotid artery stenosis of 60-79%, left ICA was less than 40% stenotic. Cardiology and neurology were consulted. He was found to be hypovolemic and was titrated the IV fluids. He was found to have acute renal failure most likely in  the setting of fluid loss with poor oral intake and nausea and vomiting. He was also hypotensive upon arrival which improved. He had a head CT without contrast on 12/05/2014: 1. No significant intracranial abnormality to explain the patient's dizziness. 2. Several small remote lacunar infarcts are present in the cerebellum, unchanged from 2011.  He had a head CT without contrast on 12/07/2014: No acute intracranial abnormalities. Mild chronic atrophy and small vessel ischemic changes. In addition, personally reviewed the images through the PACS system.  I also shared some images on the computer with patient and his wife. He reports feeling better since last week. He still feels mildly dizzy when he stands up and changes position with his head. He does not actually feel that the room is spinning but he feels insecure and full headedness. He does not have any lightheadedness or faint feeling. His carotid artery disease on the right is followed by his cardiologist. He has a pacemaker check coming up next month. He has an appointment with cardiology tomorrow. He has never had any telltale one-sided weakness or numbness or tingling or slurring of speech except for one episode in 2008 where he had numbness of his left arm and slurring of speech. He also had dizziness in 2011 when he had his previous CAT scan. The scan from 2011 was deemed stable from 2008. He does have a family history of stroke. He was given a 2 wheeled walker upon discharge from the hospital but has not used it very much. He is awaiting home health physical therapy to start  and they will visit tomorrow. He has taken a walk with his son holding onto him yesterday and that felt good. He has not been driving since his gait instability started last week.  Overall, he feels improved but not 100% back to baseline. Of note, he snores. His wife has noted breathing pauses while he is asleep. He has nocturia at least once or twice per night. He does not  wake up rested and lately he has had more trouble staying asleep. He's never had a sleep study. She started snoring about 5 years ago.  He has no family history of hereditary cerebellar syndrome or ataxia, he has no personal history of alcohol abuse and does not drink alcohol at this time. He does not smoke.  His Past Medical History Is Significant For: Past Medical History  Diagnosis Date  . Atrial fibrillation   . Lymphoma 1998    of colon   . Diverticulosis   . Esophageal stricture   . Hyperlipidemia   . Hypertension   . CVA (cerebral infarction)   . Colon polyps   . Urinary frequency   . Umbilical hernia   . Congenital heart block   . CHB (complete heart block) 11/06/2013  . Cardiomyopathy, ischemic 11/07/2013    His Past Surgical History Is Significant For: Past Surgical History  Procedure Laterality Date  . Appendectomy  1953  . Colectomy      for lymphoma  . Pacemaker placement      replaced 3 x  . Tonsillectomy    . Neck fusion    . Back surgery    . Carotid doppler  11/04/2012    Proximal Rt ICA 50-99% diameter reduction; Lft Bulb demonstrated mild amount homogeneous plaque-not hemodynamically significant; Lft ICA-normal patency.  . Pacemaker generator change  01/31/2012    [employer] DR RF model [id number] serial [id number]  . Cardiac catheterization  01/06/2003    Recommend medical therapy  . Cardiovascular stress test  07/16/2012    Mild-moderate perfusion defect seen in Basal inferior, Mid inferior, and Apica lateral consistent with infarct/scar. No scintigraphic evidence for inducible myocardial ischemia. No ECG changes. EKG negative for ischemia.  . Transthoracic echocardiogram  11/04/2012    EF 79-39%, systolic function moderately reduced, mild regurg of the aortic and mitral valves.  . Permanent pacemaker generator change N/A 01/31/2012    Procedure: PERMANENT PACEMAKER GENERATOR CHANGE;  Surgeon: Sanda Klein, MD;  Location: [facility];  Service:  Cardiovascular;  Laterality: N/A;    His Family History Is Significant For: Family History  Problem Relation Age of Onset  . Prostate cancer Father   . Colon cancer Neg Hx   . Diabetes Maternal Grandmother   . Heart disease Mother     His Social History Is Significant For: History   Social History  . Marital Status: Married    Spouse Name: [employer]  . Number of Children: 3  . Years of Education: BS   Occupational History  . Retired    Social History Main Topics  . Smoking status: Never Smoker   . Smokeless tobacco: Not on file  . Alcohol Use: No  . Drug Use: No  . Sexual Activity: Not on file   Other Topics Concern  . None   Social History Narrative   Consumes caffeine 2 cups per day      His Allergies Are:  Allergies  Allergen Reactions  . Statins     Leg cramps.   Tolerates  pravastatin.    Marland Kitchen Penicillins Rash  :   His Current Medications Are:  Outpatient Encounter Prescriptions as of 12/13/2014  Medication Sig  . ALPRAZolam (XANAX) 0.25 MG tablet Take 1-2 tablets (0.25-0.5 mg total) by mouth 3 (three) times daily as needed for sleep (dizziness). (Patient taking differently: Take 0.25-0.5 mg by mouth at bedtime as needed for sleep (dizziness). )  . amiodarone (PACERONE) 200 MG tablet Take 1 tablet (200 mg total) by mouth daily.  Marland Kitchen aspirin 81 MG tablet Take 81 mg by mouth daily.    . metoprolol (LOPRESSOR) 50 MG tablet Take 1.5 tablets (75 mg total) by mouth 2 (two) times daily.  . pantoprazole (PROTONIX) 40 MG tablet Take 1 tablet (40 mg total) by mouth daily.  . pravastatin (PRAVACHOL) 80 MG tablet Take 1 tablet (80 mg total) by mouth daily.  . Tamsulosin HCl (FLOMAX) 0.4 MG CAPS Take 0.4 mg by mouth daily.   Marland Kitchen warfarin (COUMADIN) 2.5 MG tablet Take 2.5 mg by mouth daily at 6 PM.  . warfarin (COUMADIN) 5 MG tablet Take 1-2.5 tablets (5-12.5 mg total) by mouth daily. Pt takes 1 tab on tue,wed,thu,sat,and sun. And takes 2 & 1/2 tabs on Mon, and Fri  .  [DISCONTINUED] hydrOXYzine (ATARAX/VISTARIL) 10 MG tablet Take 10 mg by mouth 3 (three) times daily as needed.  . [DISCONTINUED] promethazine (PHENERGAN) 25 MG tablet Take 1 tablet (25 mg total) by mouth every 8 (eight) hours as needed for nausea or vomiting.  :   Review of Systems:  Out of a complete 14 point review of systems, all are reviewed and negative with the exception of these symptoms as listed below:  Review of Systems  Eyes:       Blurred vision  Neurological: Positive for dizziness and weakness.       Unbalanced gait    Objective:  Neurologic Exam  Physical Exam Physical Examination:   Filed Vitals:   12/13/14 0931  BP: 129/75  Pulse: 69  Temp:   Resp:    He did not have any orthostatic blood pressure drop. He did not feel lightheaded upon standing but insecure upon standing.  General Examination: The patient is a very pleasant 69 y.o. male in no acute distress. He appears well-developed and well-nourished and well groomed. He is obese. He is mildly anxious appearing.  HEENT: Normocephalic, atraumatic, pupils are equal, round and reactive to light and accommodation. Funduscopic exam is normal with sharp disc margins noted. Extraocular tracking is good without limitation to gaze excursion or nystagmus noted. Normal smooth pursuit is noted. Hearing is grossly intact. Tympanic membranes are clear bilaterally. Face is symmetric with normal facial animation and normal facial sensation. Speech is clear with no dysarthria noted. There is no hypophonia. There is no lip, neck/head, jaw or voice tremor. Neck is supple with full range of passive and active motion. There are no carotid bruits on auscultation. Oropharynx exam reveals: mild mouth dryness, adequate dental hygiene and moderate airway crowding, due to elongated tongue and redundant soft palate. Mallampati is class II. Tongue protrudes centrally and palate elevates symmetrically. Tonsils are small bilaterally. Neck size is  17 and 7/8 inches.    Chest: Clear to auscultation without wheezing, rhonchi or crackles noted.  Heart: S1+S2+0, regular and normal without murmurs, rubs or gallops noted.   Abdomen: Soft, non-tender and non-distended with normal bowel sounds appreciated on auscultation.  Extremities: There is trace pitting edema in the distal lower extremities bilaterally. Pedal pulses are intact.  Skin: Warm and dry without trophic changes noted. There are no varicose veins.  Musculoskeletal: exam reveals no obvious joint deformities, tenderness or joint swelling or erythema.   Neurologically:  Mental status: The patient is awake, alert and oriented in all 4 spheres. His immediate and remote memory, attention, language skills and fund of knowledge are appropriate. There is no evidence of aphasia, agnosia, apraxia or anomia. Speech is clear with normal prosody and enunciation. Thought process is linear. Mood is normal and affect is normal.  Cranial nerves II - XII are as described above under HEENT exam. In addition: shoulder shrug is normal with equal shoulder height noted. Motor exam: Normal bulk, strength and tone is noted. There is no drift, tremor , but he has a slight degree of rebound on the right side. Romberg is negative. Reflexes are 2+ throughout. Babinski: Toes are flexor bilaterally. Fine motor skills and coordination: intact with normal finger taps, normal hand movements, normal rapid alternating patting, normal foot taps and normal foot agility.  Cerebellar testing: No dysmetria or intention tremor on finger to nose testing. Heel to shin is unremarkable on the left and slightly off on the right. There is no truncal ataxia.  Sensory exam: intact to light touch, pinprick, vibration, temperature sense in the upper and lower extremities.  Gait, station and balance: He stands with slight insecurity and stands wide-based. Tandem walk is very difficult for him but he is able to do this holding on. He  walks slightly insecurely and turns slowly.  Assessment and Plan:   In summary, TARQUIN WELCHER is a very pleasant 69 y.o.-year old male with an underlying complex medical history of atrial fibrillation, lymphoma diagnosed in 1998, diverticulosis, esophageal stricture, hyperlipidemia, hypertension, stroke, congenital  heart disease and complete heart block, status post pacemaker placement, and ischemic  cardiomyopathy, who has had gait and balance problems for about a week. Symptoms started suddenly. His physical and neurological exam are pertinent for mild gait ataxia and slight right-sided cerebellar signs. While not impossible, I doubt that he actually had an acute cerebellar stroke or TIA at the time. I believe that he has an existing history of cerebellar strokes and these showed up on his CAT scans and that he had a viral syndrome exacerbating existing symptoms. In light of his nausea, vomiting and resultant hypovolemia, low blood pressure, and acute renal impairments, it seems more likely that he had an acute illness presenting with acute ataxia. He has improved. His gait is still not back to normal or baseline for him. He has multiple vascular risk factors and we talked about his history quite a bit today. He needs secondary stroke prevention. He has never had a sleep study and the one test I would recommend at this time is a sleep study. He is very reluctant to pursue this. I've advised him strongly that untreated OSA is an independent risk factor for cardiovascular disease including congestive heart failure, stroke, memory impairment, and irregular heartbeat such as A. fib. At this juncture, he has had an appropriate workup in the hospital and I do not need to add any test except for a sleep study that I would recommend he pursue. He is willing to consider it and will call us back to schedule and ready. We talked about secondary stroke prevention and supportive treatment for ataxia and vertigo. He will  benefit from physical therapy which is pending to start. He is advised to change positions slowly, keep well hydrated, and be aware that  he may have recurrence of symptoms in the context of an acute illness such as a stomach bug, common cold, viral illness otherwise and so forth.   Most of my 60 minute visit today was spent in counseling and coordination of care, reviewing test results and reviewing medication.  Thank you very much for allowing me to participate in the care of this nice patient. If I can be of any further assistance to you please do not hesitate to call me at [phone].  Sincerely,   Star Age, MD, PhD

## 2014-12-13 NOTE — Patient Instructions (Signed)
Please remember, that vertigo and dizziness can recur without warning. It can last hours or days. Please change positions slowly and always stay well-hydrated. Physical therapy will help. While there is no specific medication that helps with vertigo, some people get relief with as needed use of meclizine. Certain medications can exacerbate vertigo.  Use your walker, do PT at home.  Based on your symptoms and your exam I believe you are at risk for obstructive sleep apnea or OSA, and I think we should proceed with a sleep study to determine whether you do or do not have OSA and how severe it is. If you have more than mild OSA, I want you to consider treatment with CPAP. Please remember, the risks and ramifications of moderate to severe obstructive sleep apnea or OSA are: Cardiovascular disease, including congestive heart failure, stroke, difficult to control hypertension, arrhythmias, and even type 2 diabetes has been linked to untreated OSA. Sleep apnea causes disruption of sleep and sleep deprivation in most cases, which, in turn, can cause recurrent headaches, problems with memory, mood, concentration, focus, and vigilance. Most people with untreated sleep apnea report excessive daytime sleepiness, which can affect their ability to drive. Please do not drive if you feel sleepy.  I will order a sleep study. Call back to schedule.

## 2014-12-14 ENCOUNTER — Ambulatory Visit (INDEPENDENT_AMBULATORY_CARE_PROVIDER_SITE_OTHER): Payer: Medicare Other | Admitting: Physician Assistant

## 2014-12-14 ENCOUNTER — Encounter: Payer: Self-pay | Admitting: Physician Assistant

## 2014-12-14 VITALS — BP 138/84 | HR 65 | Ht 68.0 in | Wt 234.0 lb

## 2014-12-14 DIAGNOSIS — I4891 Unspecified atrial fibrillation: Secondary | ICD-10-CM

## 2014-12-14 DIAGNOSIS — N179 Acute kidney failure, unspecified: Secondary | ICD-10-CM | POA: Diagnosis not present

## 2014-12-14 LAB — BASIC METABOLIC PANEL
BUN: 19 mg/dL (ref 6–23)
CALCIUM: 8.7 mg/dL (ref 8.4–10.5)
CHLORIDE: 105 meq/L (ref 96–112)
CO2: 30 meq/L (ref 19–32)
Creatinine, Ser: 1.4 mg/dL (ref 0.40–1.50)
GFR: 53.41 mL/min — ABNORMAL LOW (ref >60.00)
Glucose, Bld: 100 mg/dL — ABNORMAL HIGH (ref 70–99)
Potassium: 4 mEq/L (ref 3.5–5.1)
Sodium: 139 mEq/L (ref 135–145)

## 2014-12-14 LAB — PROTIME-INR
INR: 2.2 ratio — ABNORMAL HIGH (ref 0.8–1.0)
Prothrombin Time: 24.2 s — ABNORMAL HIGH (ref 9.6–13.1)

## 2014-12-14 MED ORDER — ZOLPIDEM TARTRATE 10 MG PO TABS: 10.0000 mg | ORAL_TABLET | Freq: Every evening | ORAL | Status: DC | PRN

## 2014-12-14 NOTE — Patient Instructions (Signed)
Your physician recommends that you continue on your current medications as directed. Please refer to the Current Medication list given to you today.    LABS TODAY BMET AND INR    FOLLOW TO SEE  (DR CROITURU) AT [place] 4 TO 6 WEEKS

## 2014-12-16 ENCOUNTER — Telehealth: Payer: Self-pay | Admitting: Cardiovascular Disease

## 2014-12-16 NOTE — Telephone Encounter (Signed)
Advised OK per Kathryn's note on labwork to resume losartan, continue holding HCTZ. Caller voiced understanding.  Advised that if symptoms do not improve over weekend or any other concerns use on-call line or follow up w/ [place] early next week.

## 2014-12-16 NOTE — Telephone Encounter (Signed)
Pt have not received his lab results from 12-14-14. Pt ifeet are still swelling,need to know what to do about his medicine.Please call today if possible.

## 2014-12-18 ENCOUNTER — Inpatient Hospital Stay (HOSPITAL_COMMUNITY)
Admission: EM | Admit: 2014-12-18 | Discharge: 2014-12-20 | DRG: 292 | Disposition: A | Discharge status: Home / Self Care | Payer: Medicare Other | Attending: Cardiology | Admitting: Cardiology

## 2014-12-18 ENCOUNTER — Encounter (HOSPITAL_COMMUNITY): Payer: Self-pay | Admitting: Emergency Medicine

## 2014-12-18 ENCOUNTER — Other Ambulatory Visit (HOSPITAL_COMMUNITY): Payer: Self-pay

## 2014-12-18 ENCOUNTER — Emergency Department (HOSPITAL_COMMUNITY): Payer: Medicare Other

## 2014-12-18 DIAGNOSIS — Z880 Allergy status to penicillin: Secondary | ICD-10-CM

## 2014-12-18 DIAGNOSIS — I701 Atherosclerosis of renal artery: Secondary | ICD-10-CM | POA: Diagnosis present

## 2014-12-18 DIAGNOSIS — Z86010 Personal history of colonic polyps: Secondary | ICD-10-CM

## 2014-12-18 DIAGNOSIS — I10 Essential (primary) hypertension: Secondary | ICD-10-CM | POA: Diagnosis present

## 2014-12-18 DIAGNOSIS — I2510 Atherosclerotic heart disease of native coronary artery without angina pectoris: Secondary | ICD-10-CM | POA: Diagnosis present

## 2014-12-18 DIAGNOSIS — I482 Chronic atrial fibrillation, unspecified: Secondary | ICD-10-CM | POA: Diagnosis present

## 2014-12-18 DIAGNOSIS — E861 Hypovolemia: Secondary | ICD-10-CM | POA: Diagnosis present

## 2014-12-18 DIAGNOSIS — Z9049 Acquired absence of other specified parts of digestive tract: Secondary | ICD-10-CM | POA: Diagnosis present

## 2014-12-18 DIAGNOSIS — Z7901 Long term (current) use of anticoagulants: Secondary | ICD-10-CM

## 2014-12-18 DIAGNOSIS — I5043 Acute on chronic combined systolic (congestive) and diastolic (congestive) heart failure: Principal | ICD-10-CM | POA: Diagnosis present

## 2014-12-18 DIAGNOSIS — Z950 Presence of cardiac pacemaker: Secondary | ICD-10-CM | POA: Diagnosis present

## 2014-12-18 DIAGNOSIS — R0602 Shortness of breath: Secondary | ICD-10-CM | POA: Diagnosis not present

## 2014-12-18 DIAGNOSIS — I5022 Chronic systolic (congestive) heart failure: Secondary | ICD-10-CM | POA: Diagnosis present

## 2014-12-18 DIAGNOSIS — E785 Hyperlipidemia, unspecified: Secondary | ICD-10-CM | POA: Diagnosis present

## 2014-12-18 DIAGNOSIS — Z8673 Personal history of transient ischemic attack (TIA), and cerebral infarction without residual deficits: Secondary | ICD-10-CM

## 2014-12-18 DIAGNOSIS — Z981 Arthrodesis status: Secondary | ICD-10-CM

## 2014-12-18 DIAGNOSIS — I519 Heart disease, unspecified: Secondary | ICD-10-CM

## 2014-12-18 DIAGNOSIS — I25119 Atherosclerotic heart disease of native coronary artery with unspecified angina pectoris: Secondary | ICD-10-CM | POA: Diagnosis present

## 2014-12-18 DIAGNOSIS — Z9119 Patient's noncompliance with other medical treatment and regimen: Secondary | ICD-10-CM | POA: Diagnosis present

## 2014-12-18 DIAGNOSIS — Z8572 Personal history of non-Hodgkin lymphomas: Secondary | ICD-10-CM

## 2014-12-18 DIAGNOSIS — Z6832 Body mass index (BMI) 32.0-32.9, adult: Secondary | ICD-10-CM

## 2014-12-18 DIAGNOSIS — I442 Atrioventricular block, complete: Secondary | ICD-10-CM | POA: Diagnosis present

## 2014-12-18 DIAGNOSIS — E876 Hypokalemia: Secondary | ICD-10-CM | POA: Diagnosis not present

## 2014-12-18 DIAGNOSIS — I5042 Chronic combined systolic (congestive) and diastolic (congestive) heart failure: Secondary | ICD-10-CM | POA: Insufficient documentation

## 2014-12-18 DIAGNOSIS — I959 Hypotension, unspecified: Secondary | ICD-10-CM | POA: Diagnosis present

## 2014-12-18 DIAGNOSIS — I255 Ischemic cardiomyopathy: Secondary | ICD-10-CM | POA: Diagnosis present

## 2014-12-18 DIAGNOSIS — N179 Acute kidney failure, unspecified: Secondary | ICD-10-CM | POA: Diagnosis present

## 2014-12-18 DIAGNOSIS — E669 Obesity, unspecified: Secondary | ICD-10-CM | POA: Diagnosis present

## 2014-12-18 DIAGNOSIS — I5021 Acute systolic (congestive) heart failure: Secondary | ICD-10-CM

## 2014-12-18 HISTORY — DX: Chronic combined systolic (congestive) and diastolic (congestive) heart failure: I50.42

## 2014-12-18 LAB — CBC
HCT: 43.3 % (ref 39.0–52.0)
Hemoglobin: 14.9 g/dL (ref 13.0–17.0)
MCH: 33.6 pg (ref 26.0–34.0)
MCHC: 34.4 g/dL (ref 30.0–36.0)
MCV: 97.7 fL (ref 78.0–100.0)
Platelets: 191 10*3/uL (ref 150–400)
RBC: 4.43 MIL/uL (ref 4.22–5.81)
RDW: 13.3 % (ref 11.5–15.5)
WBC: 11.8 10*3/uL — ABNORMAL HIGH (ref 4.0–10.5)

## 2014-12-18 LAB — BASIC METABOLIC PANEL
Anion gap: 6 (ref 5–15)
Anion gap: 4 — ABNORMAL LOW (ref 5–15)
BUN: 18 mg/dL (ref 6–23)
BUN: 14 mg/dL (ref 6–23)
CALCIUM: 8.6 mg/dL (ref 8.4–10.5)
CHLORIDE: 105 mmol/L (ref 96–112)
CO2: 27 mmol/L (ref 19–32)
CO2: 31 mmol/L (ref 19–32)
CREATININE: 1.31 mg/dL (ref 0.50–1.35)
CREATININE: 1.34 mg/dL (ref 0.50–1.35)
Calcium: 8.3 mg/dL — ABNORMAL LOW (ref 8.4–10.5)
Chloride: 101 mmol/L (ref 96–112)
GFR calc non Af Amer: 54 mL/min — ABNORMAL LOW (ref >90)
GFR calc non Af Amer: 53 mL/min — ABNORMAL LOW (ref >90)
GFR, EST AFRICAN AMERICAN: 63 mL/min — AB (ref >90)
GFR, EST AFRICAN AMERICAN: 61 mL/min — AB (ref >90)
GLUCOSE: 121 mg/dL — AB (ref 70–99)
Glucose, Bld: 124 mg/dL — ABNORMAL HIGH (ref 70–99)
Potassium: 4.2 mmol/L (ref 3.5–5.1)
Potassium: 3.5 mmol/L (ref 3.5–5.1)
Sodium: 138 mmol/L (ref 135–145)
Sodium: 136 mmol/L (ref 135–145)

## 2014-12-18 LAB — MAGNESIUM: Magnesium: 1.4 mg/dL — ABNORMAL LOW (ref 1.5–2.5)

## 2014-12-18 LAB — I-STAT TROPONIN, ED: Troponin i, poc: 0.01 ng/mL (ref 0.00–0.08)

## 2014-12-18 LAB — BRAIN NATRIURETIC PEPTIDE: B NATRIURETIC PEPTIDE 5: 363.2 pg/mL — AB (ref 0.0–100.0)

## 2014-12-18 LAB — PROTIME-INR
INR: 2.36 — AB (ref 0.00–1.49)
Prothrombin Time: 26 seconds — ABNORMAL HIGH (ref 11.6–15.2)

## 2014-12-18 MED ORDER — PANTOPRAZOLE SODIUM 40 MG PO TBEC
40.0000 mg | DELAYED_RELEASE_TABLET | Freq: Every day | ORAL | Status: DC
  Administered 2014-12-18 – 2014-12-20 (×3): 40 mg via ORAL
  Filled 2014-12-18 (×2): qty 1

## 2014-12-18 MED ORDER — FUROSEMIDE 10 MG/ML IJ SOLN
20.0000 mg | Freq: Three times a day (TID) | INTRAMUSCULAR | Status: DC
  Administered 2014-12-18 – 2014-12-20 (×7): 20 mg via INTRAVENOUS
  Filled 2014-12-18 (×10): qty 2

## 2014-12-18 MED ORDER — SODIUM CHLORIDE 0.9 % IV SOLN: 250.0000 mL | INTRAVENOUS | Status: DC | PRN

## 2014-12-18 MED ORDER — ONDANSETRON HCL 4 MG/2ML IJ SOLN: 4.0000 mg | Freq: Four times a day (QID) | INTRAMUSCULAR | Status: DC | PRN

## 2014-12-18 MED ORDER — ASPIRIN 81 MG PO CHEW
81.0000 mg | CHEWABLE_TABLET | Freq: Every day | ORAL | Status: DC
  Administered 2014-12-18 – 2014-12-20 (×3): 81 mg via ORAL
  Filled 2014-12-18 (×4): qty 1

## 2014-12-18 MED ORDER — SODIUM CHLORIDE 0.9 % IJ SOLN
3.0000 mL | Freq: Two times a day (BID) | INTRAMUSCULAR | Status: DC
  Administered 2014-12-18 – 2014-12-20 (×5): 3 mL via INTRAVENOUS

## 2014-12-18 MED ORDER — TAMSULOSIN HCL 0.4 MG PO CAPS
0.4000 mg | ORAL_CAPSULE | Freq: Every day | ORAL | Status: DC
  Administered 2014-12-18 – 2014-12-20 (×3): 0.4 mg via ORAL
  Filled 2014-12-18 (×3): qty 1

## 2014-12-18 MED ORDER — LOSARTAN POTASSIUM 50 MG PO TABS
50.0000 mg | ORAL_TABLET | Freq: Every day | ORAL | Status: DC
  Administered 2014-12-18: 50 mg via ORAL
  Filled 2014-12-18 (×2): qty 1

## 2014-12-18 MED ORDER — ACETAMINOPHEN 325 MG PO TABS: 650.0000 mg | ORAL_TABLET | ORAL | Status: DC | PRN

## 2014-12-18 MED ORDER — WARFARIN SODIUM 2.5 MG PO TABS
2.5000 mg | ORAL_TABLET | Freq: Every day | ORAL | Status: DC
  Administered 2014-12-18: 2.5 mg via ORAL
  Filled 2014-12-18 (×2): qty 1

## 2014-12-18 MED ORDER — METOPROLOL TARTRATE 50 MG PO TABS
75.0000 mg | ORAL_TABLET | Freq: Two times a day (BID) | ORAL | Status: DC
  Administered 2014-12-18 (×2): 75 mg via ORAL
  Filled 2014-12-18 (×4): qty 1

## 2014-12-18 MED ORDER — FUROSEMIDE 10 MG/ML IJ SOLN
20.0000 mg | Freq: Once | INTRAMUSCULAR | Status: AC
  Administered 2014-12-18: 20 mg via INTRAVENOUS
  Filled 2014-12-18: qty 4

## 2014-12-18 MED ORDER — ZOLPIDEM TARTRATE 5 MG PO TABS: 10.0000 mg | ORAL_TABLET | Freq: Every evening | ORAL | Status: DC | PRN

## 2014-12-18 MED ORDER — PRAVASTATIN SODIUM 80 MG PO TABS
80.0000 mg | ORAL_TABLET | Freq: Every day | ORAL | Status: DC
  Administered 2014-12-18 – 2014-12-20 (×3): 80 mg via ORAL
  Filled 2014-12-18 (×4): qty 1

## 2014-12-18 MED ORDER — WARFARIN - PHARMACIST DOSING INPATIENT: Freq: Every day | Status: DC

## 2014-12-18 MED ORDER — MAGNESIUM SULFATE 2 GM/50ML IV SOLN: 2.0000 g | Freq: Once | INTRAVENOUS | Status: DC

## 2014-12-18 MED ORDER — SODIUM CHLORIDE 0.9 % IJ SOLN: 3.0000 mL | INTRAMUSCULAR | Status: DC | PRN

## 2014-12-18 MED ORDER — POTASSIUM CHLORIDE CRYS ER 20 MEQ PO TBCR: 40.0000 meq | EXTENDED_RELEASE_TABLET | Freq: Once | ORAL | Status: DC

## 2014-12-18 MED ORDER — AMIODARONE HCL 200 MG PO TABS
200.0000 mg | ORAL_TABLET | Freq: Every day | ORAL | Status: DC
  Administered 2014-12-18 – 2014-12-20 (×3): 200 mg via ORAL
  Filled 2014-12-18 (×3): qty 1

## 2014-12-18 MED ORDER — ZOLPIDEM TARTRATE 5 MG PO TABS
5.0000 mg | ORAL_TABLET | Freq: Every evening | ORAL | Status: DC | PRN
  Administered 2014-12-19 – 2014-12-20 (×2): 5 mg via ORAL
  Filled 2014-12-18 (×2): qty 1

## 2014-12-18 NOTE — Plan of Care (Signed)
Problem: Phase I Progression Outcomes Goal: EF % per last Echo/documented,Core Reminder form on chart Outcome: Completed/Met Date Met:  12/18/14 EF 30-35%(12-08-14)

## 2014-12-18 NOTE — ED Notes (Signed)
Pt. Is for transfer to [facility] , [place] called, report given to receiving Nurse .

## 2014-12-18 NOTE — ED Notes (Signed)
Patient reports a 10 pound weight gain over 1 day. Patient states he is increasingly SOB, had some dizziness "balanace problems" earlier today. Patient states he called his cardiologist and was advised to seek evaluation here. Hx of CHF, renal failure. Bilateral legs are swollen.

## 2014-12-18 NOTE — Progress Notes (Signed)
ANTICOAGULATION CONSULT NOTE - Initial Consult  Pharmacy Consult for warfarin Indication: afib  Allergies  Allergen Reactions  . Statins     Leg cramps.   Tolerates pravastatin.    Marland Kitchen Penicillins Rash    Patient Measurements: Height: 5\' 9"  (175.3 cm) Weight: 237 lb 6.4 oz (107.684 kg) IBW/kg (Calculated) : 70.7  Vital Signs: Temp: 98.7 F (37.1 C) (03/06 0704) Temp Source: Oral (03/06 0704) BP: 147/94 mmHg (03/06 0704) Pulse Rate: 66 (03/06 0704)  Labs:  Recent Labs  12/18/14 0129  HGB 14.9  HCT 43.3  PLT 191  LABPROT 26.0*  INR 2.36*  CREATININE 1.31    Estimated Creatinine Clearance: 65.3 mL/min (by C-G formula based on Cr of 1.31).   Medical History: Past Medical History  Diagnosis Date  . Atrial fibrillation   . Lymphoma 1998    of colon   . Diverticulosis   . Esophageal stricture   . Hyperlipidemia   . Hypertension   . CVA (cerebral infarction)   . Colon polyps   . Urinary frequency   . Umbilical hernia   . Congenital heart block   . CHB (complete heart block) 11/06/2013  . Cardiomyopathy, ischemic 11/07/2013    Assessment: 69 year old Male recently discharged 12/10/14 for ataxia and dizziness and ARF.He presents to [facility] early AM today with 10 lb wt gain over 1 day. Transferred to [facility] for further cardiac care.   Patient has history of afib on chronic warfarin. INR therapeutic this morning at 2.3. No bleeding issues noted on arrival. CBC normal.   Warfarin dose pta - 2.5mg  daily, last dose yesterday  Goal of Therapy:  INR 2-3 Monitor platelets by anticoagulation protocol: Yes   Plan:  Continue warfarin 2.5mg  daily INR daily x3 then reassess  Erin Hearing PharmD., BCPS Clinical Pharmacist Pager [phone] 12/18/2014 9:25 AM

## 2014-12-18 NOTE — H&P (Addendum)
Benjamin Mckay is an 69 y.o. male.    Primary Cardiologist:  Dr. Bertrum Sol Pcp Not In System  Chief Complaint: SOB HPI: 69 year old Male recently discharged 12/10/14 for ataxia and dizziness and ARF.  ARB was held and HCTZ.  Pt had follow up 12/14/14 and was euvolemic  amd his cr had improved so ARB was resumed but not HCTZ as Cr was not yet back to baseline.  He went to [facility] early AM today with 10 lb wt gain over 1 day.   He also had increasing SOB, had some dizziness "balanace problems" earlier today.  Found to be in CHF and given 20 mg IV lasix.  EKG pacing.  He does admit to high salt breakfast yesterday.    Troponin 0.01, BNP 363, INR 2.36, CXR + Acute CHF.  Pt was transferred to [facility] HF unit for admit.  His Cr. Is stable.  His past medical history of Permanent atrial fibrillation on coumadin, lymphoma (1998), diverticulosis, esophageal stricture, HLD, HTN, previous CVA, CHB s/p PPM and ischemic cardiomyopathy.  On recent admit his Cr. Peaked at 3.9.  With his ataxia and dizziness he saw neurology follow up by Dr. Rexene Alberts who felt that he that he had an existing history of cerebellar strokes which showed up on his CAT scans and that his viral syndrome was exacerbating existing symptoms. In light of his nausea, vomiting and resultant hypovolemia, low blood pressure, and acute renal impairments, it seems more likely that he had an acute illness presenting with acute ataxia. She felt that strongly that he should proceed with a sleep study for probable sleep apnea as he has a history of snoring, apneic episodes and daytime somnolence according to his wife.  His cardiac status is followed carefully by Dr. Sallyanne Kuster (Dr. Loletha Grayer). He has known coronary disease with moderate lesions in all major coronary territories but has no complaints of angina and had a low risk nuclear stress test in 2013. There is history of ischemic cardiomyopathy with an ejection fraction in the past of 35-40%. He's had  CHB and has a single-chamber pacemaker. He is pacemaker dependent with 100% ventricular pacing and no reliable underlying escape rhythm. His device is a single chamber St. Jude device. His initial device was implanted in 1984 and he has had several generator change out's most recently with a generator change and a new ventricular lead performed in 2013. He takes amiodarone. Dr. Loletha Grayer suggested earlier this year that there was no longer an indication for the amiodarone. However the patient chose to continue to take it. He has moderate carotid disease. There is a history in the past of TIAs with lacunar infarcts. There is a history of renal artery stenosis, but he has never had severe hypertension related to it. Historically his volume has been controlled despite his left ventricular dysfunction. There is also a history of non-Hodgkin's lymphoma of the colon for which he had surgery. The patient has chronic atrial fib.  CHA2DS2-VSc score 6, he is on long-term Coumadin for this.  Carotid dopplers: 60-79% right ICA stenosis. There is 1-39% left ICA stenosis. Vertebral artery flow is antegrade.  -- 2D echo (12/08/14): mod-sev LVH. EF 30-35%. Ventricular septum: abnormal function anddyssynergy c/w RV pacing. Mild AR, mild MR, mod LA dilation. Mild RV dilation  Past Medical History  Diagnosis Date  . Atrial fibrillation   . Lymphoma 1998    of colon   . Diverticulosis   .  Esophageal stricture   . Hyperlipidemia   . Hypertension   . CVA (cerebral infarction)   . Colon polyps   . Urinary frequency   . Umbilical hernia   . Congenital heart block   . CHB (complete heart block) 11/06/2013  . Cardiomyopathy, ischemic 11/07/2013    Past Surgical History  Procedure Laterality Date  . Appendectomy  1953  . Colectomy      for lymphoma  . Pacemaker placement      replaced 3 x  . Tonsillectomy    . Neck fusion    . Back surgery    . Carotid doppler  11/04/2012    Proximal Rt ICA 50-99% diameter reduction;  Lft Bulb demonstrated mild amount homogeneous plaque-not hemodynamically significant; Lft ICA-normal patency.  . Pacemaker generator change  01/31/2012    St Jude Med [employer] DR RF model [id number] serial [id number]  . Cardiac catheterization  01/06/2003    Recommend medical therapy  . Cardiovascular stress test  07/16/2012    Mild-moderate perfusion defect seen in Basal inferior, Mid inferior, and Apica lateral consistent with infarct/scar. No scintigraphic evidence for inducible myocardial ischemia. No ECG changes. EKG negative for ischemia.  . Transthoracic echocardiogram  11/04/2012    EF 63-33%, systolic function moderately reduced, mild regurg of the aortic and mitral valves.  . Permanent pacemaker generator change N/A 01/31/2012    Procedure: PERMANENT PACEMAKER GENERATOR CHANGE;  Surgeon: Sanda Klein, MD;  Location: [facility]ATH [facility];  Service: Cardiovascular;  Laterality: N/A;    Family History  Problem Relation Age of Onset  . Prostate cancer Father   . Colon cancer Neg Hx   . Diabetes Maternal Grandmother   . Heart disease Mother    Social History:  reports that he has never smoked. He does not have any smokeless tobacco history on file. He reports that he does not drink alcohol or use illicit drugs.  Allergies:  Allergies  Allergen Reactions  . Statins     Leg cramps.   Tolerates pravastatin.    Marland Kitchen Penicillins Rash    Medications Prior to Admission  Medication Sig Dispense Refill  . amiodarone (PACERONE) 200 MG tablet Take 1 tablet (200 mg total) by mouth daily. 90 tablet 3  . aspirin 81 MG tablet Take 81 mg by mouth daily.      Marland Kitchen losartan (COZAAR) 50 MG tablet Take 50 mg by mouth daily.    . metoprolol (LOPRESSOR) 50 MG tablet Take 1.5 tablets (75 mg total) by mouth 2 (two) times daily. 270 tablet 3  . pantoprazole (PROTONIX) 40 MG tablet Take 1 tablet (40 mg total) by mouth daily. 90 tablet 3  . pravastatin (PRAVACHOL) 80 MG tablet Take 1 tablet (80 mg total) by mouth daily. 90  tablet 3  . Tamsulosin HCl (FLOMAX) 0.4 MG CAPS Take 0.4 mg by mouth daily.     Marland Kitchen warfarin (COUMADIN) 2.5 MG tablet Take 2.5 mg by mouth daily at 6 PM.    . zolpidem (AMBIEN) 10 MG tablet Take 1 tablet (10 mg total) by mouth at bedtime as needed for sleep. 7 tablet 0  . ALPRAZolam (XANAX) 0.25 MG tablet Take 1-2 tablets (0.25-0.5 mg total) by mouth 3 (three) times daily as needed for sleep (dizziness). (Patient not taking: Reported on 12/18/2014) 20 tablet 0    Results for orders placed or performed during the hospital encounter of 12/18/14 (from the past 48 hour(s))  CBC     Status: Abnormal   Collection Time:  12/18/14  1:29 AM  Result Value Ref Range   WBC 11.8 (H) 4.0 - 10.5 K/uL   RBC 4.43 4.22 - 5.81 MIL/uL   Hemoglobin 14.9 13.0 - 17.0 g/dL   HCT 43.3 39.0 - 52.0 %   MCV 97.7 78.0 - 100.0 fL   MCH 33.6 26.0 - 34.0 pg   MCHC 34.4 30.0 - 36.0 g/dL   RDW 13.3 11.5 - 15.5 %   Platelets 191 150 - 400 K/uL  Basic metabolic panel     Status: Abnormal   Collection Time: 12/18/14  1:29 AM  Result Value Ref Range   Sodium 138 135 - 145 mmol/L   Potassium 4.2 3.5 - 5.1 mmol/L   Chloride 105 96 - 112 mmol/L   CO2 27 19 - 32 mmol/L   Glucose, Bld 124 (H) 70 - 99 mg/dL   BUN 18 6 - 23 mg/dL   Creatinine, Ser 1.31 0.50 - 1.35 mg/dL   Calcium 8.6 8.4 - 10.5 mg/dL   GFR calc non Af Amer 54 (L) >90 mL/min   GFR calc Af Amer 63 (L) >90 mL/min    Comment: (NOTE) The eGFR has been calculated using the CKD EPI equation. This calculation has not been validated in all clinical situations. eGFR's persistently <90 mL/min signify possible Chronic Kidney Disease.    Anion gap 6 5 - 15  BNP (order ONLY if patient complains of dyspnea/SOB AND you have documented it for THIS visit)     Status: Abnormal   Collection Time: 12/18/14  1:29 AM  Result Value Ref Range   B Natriuretic Peptide 363.2 (H) 0.0 - 100.0 pg/mL  Protime-INR (if pt is taking Coumadin)     Status: Abnormal   Collection Time:  12/18/14  1:29 AM  Result Value Ref Range   Prothrombin Time 26.0 (H) 11.6 - 15.2 seconds   INR 2.36 (H) 0.00 - 1.49  I-stat troponin, ED (not at [facility])     Status: None   Collection Time: 12/18/14  1:37 AM  Result Value Ref Range   Troponin i, poc 0.01 0.00 - 0.08 ng/mL   Comment 3            Comment: Due to the release kinetics of cTnI, a negative result within the first hours of the onset of symptoms does not rule out myocardial infarction with certainty. If myocardial infarction is still suspected, repeat the test at appropriate intervals.    Dg Chest Port 1 View  12/18/2014   CLINICAL DATA:  Initial evaluation for congestive heart failure.  EXAM: PORTABLE CHEST - 1 VIEW  COMPARISON:  Prior radiograph from 12/07/2014  FINDINGS: Left-sided pacemaker/ AICD is unchanged. Cardiomegaly is stable. Mediastinal silhouette within normal limits.  Lungs are normally inflated. There is diffuse pulmonary vascular congestion with indistinctness of the interstitial markings, most consistent with pulmonary edema. No definite focal infiltrate. No pleural effusion. There is no pneumothorax.  No acute osseus abnormality para  IMPRESSION: Findings most consistent with acute CHF exacerbation with moderate diffuse pulmonary edema.   Electronically Signed   By: Jeannine Boga M.D.   On: 12/18/2014 01:48    ROS: General:no colds or fevers, no weight changes Skin:no rashes or ulcers HEENT:no blurred vision, no congestion CV:see HPI PUL:see HPI GI:no diarrhea constipation or melena, no indigestion GU:no hematuria, no dysuria MS:no joint pain, no claudication Neuro:no syncope, + lightheadedness since stroke but worse last pm Endo:no diabetes, no thyroid disease   Blood pressure 147/94, pulse 66,  temperature 98.7 F (37.1 C), temperature source Oral, resp. rate 20, height 5' 9"  (1.753 m), weight 237 lb 6.4 oz (107.684 kg), SpO2 98 %. PE: General:Pleasant affect, NAD Skin:Warm and dry, brisk capillary  refill HEENT:normocephalic, sclera clear, mucus membranes moist Neck:supple, no JVD, no bruits, no adenopathy  Heart:S1S2 RRR without murmur, gallup, rub or click Lungs:diminished in bases without rales, rhonchi, or wheezes GYI:RSWNI, soft, non tender, + BS, do not palpate liver spleen or masses- pt stated abd. Girth larger with volume overload Ext: 1-2+ Lt>rt. lower ext edema, 2+ pedal pulses, 2+ radial pulses Neuro:alert and oriented X 3, MAE, follows commands, + facial symmetry    Assessment/Plan Principal Problem:   Acute on chronic systolic and diastolic heart failure, NYHA class 3 will continue with IV lasix 20 mg every 8 hours He is currently -950.  Wt down 3 lbs, different scales.  Reminded of sodium restriction.  Active Problems:   Recent acute renal failure with holding ARB and HCTZ Cr much improved now 1.31, he was placed back on ARB 2-3 days ago.  Once CHF controlled add HCTZ back   HLD-on Pravachol with hx of statin intolerance   HTN (hypertension)- controlled, mostly this AM elevated but has no had meds   CAD (coronary artery disease)- non obstructive disease 2004 by cath and neg nuc since   Atrial fibrillation, permanent, CHA2DS2VSc score 6, on coumadin   Long term current use of anticoagulant therapy   CHB (complete heart block)-PPM   Single chamber St. Jude pacemaker 2013   Cardiomyopathy, ischemic- EF 30-35% 12/08/14    CHF (congestive heart failure)    OEVOJJ,KKXFG R Nurse Practitioner Certified [place] Pager [phone] or after 5pm or weekends call [phone] 12/18/2014, 7:35 AM   The patient was seen and examined, and I agree with the assessment and plan as documented above, with modifications as noted below. Pt with aforementioned cardiovascular history admitted with acute on chronic systolic and diastolic heart failure, likely secondary to both dietary noncompliance and withholding of HCTZ. EF 30-35% on 12/08/14 (35-40% on 11/04/12). On  beta blockers and ARB. No mention of ICD consideration, but will defer to Dr. Sallyanne Kuster in outpatient setting. Has already had over 1.2 L output on current regimen of IV Lasix and is gradually feeling better. Denies chest pain. ECG shows ventricular pacing. Continue current therapies.

## 2014-12-18 NOTE — Progress Notes (Deleted)
Pt had 7 beat run VT. Dr Oleta Mouse made aware. Orders recieved

## 2014-12-18 NOTE — ED Provider Notes (Signed)
CSN: [id number]     Arrival date & time 12/18/14  0042 History   First MD Initiated Contact with Patient 12/18/14 0252     Chief Complaint  Patient presents with  . Congestive Heart Failure    10# weight gain in 1 day, SOB, increased work of breathing     (Consider location/radiation/quality/duration/timing/severity/associated sxs/prior Treatment) Patient is a 69 y.o. male presenting with CHF. The history is provided by the patient and the spouse. No language interpreter was used.  Congestive Heart Failure This is a new problem. The current episode started today. Pertinent negatives include no abdominal pain, chest pain, chills, diaphoresis or fever. Associated symptoms comments: He presents to ED on recommendation of his cardiologist for evaluation of 10-pound weight gain over the last 24 hours. He has a history of CHF and reports lower extremity swelling in addition to the weight gain. No chest pain, or significant SOB. He states that he was previously on Lasix until a recent hospitalization for "mild stroke" and kidney failure and has not been restarted on any diuretic. Marland Kitchen    Past Medical History  Diagnosis Date  . Atrial fibrillation   . Lymphoma 1998    of colon   . Diverticulosis   . Esophageal stricture   . Hyperlipidemia   . Hypertension   . CVA (cerebral infarction)   . Colon polyps   . Urinary frequency   . Umbilical hernia   . Congenital heart block   . CHB (complete heart block) 11/06/2013  . Cardiomyopathy, ischemic 11/07/2013   Past Surgical History  Procedure Laterality Date  . Appendectomy  1953  . Colectomy      for lymphoma  . Pacemaker placement      replaced 3 x  . Tonsillectomy    . Neck fusion    . Back surgery    . Carotid doppler  11/04/2012    Proximal Rt ICA 50-99% diameter reduction; Lft Bulb demonstrated mild amount homogeneous plaque-not hemodynamically significant; Lft ICA-normal patency.  . Pacemaker generator change  01/31/2012    St Jude Med  Accent DR RF model [id number] serial [id number]  . Cardiac catheterization  01/06/2003    Recommend medical therapy  . Cardiovascular stress test  07/16/2012    Mild-moderate perfusion defect seen in Basal inferior, Mid inferior, and Apica lateral consistent with infarct/scar. No scintigraphic evidence for inducible myocardial ischemia. No ECG changes. EKG negative for ischemia.  . Transthoracic echocardiogram  11/04/2012    EF 12-24%, systolic function moderately reduced, mild regurg of the aortic and mitral valves.  . Permanent pacemaker generator change N/A 01/31/2012    Procedure: PERMANENT PACEMAKER GENERATOR CHANGE;  Surgeon: Sanda Klein, MD;  Location: [facility];  Service: Cardiovascular;  Laterality: N/A;   Family History  Problem Relation Age of Onset  . Prostate cancer Father   . Colon cancer Neg Hx   . Diabetes Maternal Grandmother   . Heart disease Mother    History  Substance Use Topics  . Smoking status: Never Smoker   . Smokeless tobacco: Not on file  . Alcohol Use: No    Review of Systems  Constitutional: Negative for fever, chills and diaphoresis.  HENT: Negative.   Respiratory: Negative.  Negative for shortness of breath.   Cardiovascular: Positive for leg swelling. Negative for chest pain.  Gastrointestinal: Negative.  Negative for abdominal pain.  Musculoskeletal: Negative.   Skin: Negative.   Neurological: Negative.       Allergies  Statins and Penicillins  Home Medications   Prior to Admission medications   Medication Sig Start Date End Date Taking? Authorizing Provider  amiodarone (PACERONE) 200 MG tablet Take 1 tablet (200 mg total) by mouth daily. 05/17/14  Yes Mihai Croitoru, MD  aspirin 81 MG tablet Take 81 mg by mouth daily.     Yes Historical Provider, MD  losartan (COZAAR) 50 MG tablet Take 50 mg by mouth daily.   Yes Historical Provider, MD  metoprolol (LOPRESSOR) 50 MG tablet Take 1.5 tablets (75 mg total) by mouth 2 (two) times daily. 11/16/14   Yes Mihai Croitoru, MD  pantoprazole (PROTONIX) 40 MG tablet Take 1 tablet (40 mg total) by mouth daily. 05/17/14  Yes Mihai Croitoru, MD  pravastatin (PRAVACHOL) 80 MG tablet Take 1 tablet (80 mg total) by mouth daily. 10/18/14  Yes Mihai Croitoru, MD  Tamsulosin HCl (FLOMAX) 0.4 MG CAPS Take 0.4 mg by mouth daily.    Yes Historical Provider, MD  warfarin (COUMADIN) 2.5 MG tablet Take 2.5 mg by mouth daily at 6 PM.   Yes Historical Provider, MD  zolpidem (AMBIEN) 10 MG tablet Take 1 tablet (10 mg total) by mouth at bedtime as needed for sleep. 12/14/14 01/13/15 Yes Eileen Stanford, PA-C  ALPRAZolam Duanne Moron) 0.25 MG tablet Take 1-2 tablets (0.25-0.5 mg total) by mouth 3 (three) times daily as needed for sleep (dizziness). Patient not taking: Reported on 12/18/2014 12/05/14   Shawnee Knapp, MD   BP 144/78 mmHg  Pulse 64  Temp(Src) 98.3 F (36.8 C) (Oral)  Resp 23  Wt 240 lb (108.863 kg)  SpO2 95% Physical Exam  Constitutional: He is oriented to person, place, and time. He appears well-developed and well-nourished.  HENT:  Head: Normocephalic.  Neck: Normal range of motion. Neck supple.  Cardiovascular: Normal rate and regular rhythm.   Pulmonary/Chest: Effort normal and breath sounds normal. He has no wheezes. He exhibits no tenderness.  Diffuse rales only.  Abdominal: Soft. Bowel sounds are normal. There is no tenderness. There is no rebound and no guarding.  Musculoskeletal: Normal range of motion. He exhibits edema.  Neurological: He is alert and oriented to person, place, and time. Coordination normal.  Skin: Skin is warm and dry. No rash noted.  Psychiatric: He has a normal mood and affect.    ED Course  Procedures (including critical care time) Labs Review Labs Reviewed  CBC - Abnormal; Notable for the following:    WBC 11.8 (*)    All other components within normal limits  BASIC METABOLIC PANEL - Abnormal; Notable for the following:    Glucose, Bld 124 (*)    GFR calc non Af Amer 54  (*)    GFR calc Af Amer 63 (*)    All other components within normal limits  BRAIN NATRIURETIC PEPTIDE - Abnormal; Notable for the following:    B Natriuretic Peptide 363.2 (*)    All other components within normal limits  PROTIME-INR - Abnormal; Notable for the following:    Prothrombin Time 26.0 (*)    INR 2.36 (*)    All other components within normal limits  Randolm Idol, ED    Imaging Review Dg Chest Port 1 View  12/18/2014   CLINICAL DATA:  Initial evaluation for congestive heart failure.  EXAM: PORTABLE CHEST - 1 VIEW  COMPARISON:  Prior radiograph from 12/07/2014  FINDINGS: Left-sided pacemaker/ AICD is unchanged. Cardiomegaly is stable. Mediastinal silhouette within normal limits.  Lungs are normally inflated. There is diffuse pulmonary vascular  congestion with indistinctness of the interstitial markings, most consistent with pulmonary edema. No definite focal infiltrate. No pleural effusion. There is no pneumothorax.  No acute osseus abnormality para  IMPRESSION: Findings most consistent with acute CHF exacerbation with moderate diffuse pulmonary edema.   Electronically Signed   By: Jeannine Boga M.D.   On: 12/18/2014 01:48     EKG Interpretation None      MDM   Final diagnoses:  Acute systolic congestive heart failure    Discussed the evaluation with cardiology fellow who advises transfer to [facility] for admission under Dr. Stanford Breed. Care plan discussed with the patient and wife - all questions answered.   He is given Lasix 20 mg in ED with good urine output of greater than 1 liter. Specific measurements of output are being recorded.      Dewaine Oats, PA-C 12/18/14 [id number]  Everlene Balls, MD 12/18/14 [phone]

## 2014-12-19 LAB — PROTIME-INR
INR: 1.93 — ABNORMAL HIGH (ref 0.00–1.49)
Prothrombin Time: 22.3 seconds — ABNORMAL HIGH (ref 11.6–15.2)

## 2014-12-19 LAB — BASIC METABOLIC PANEL
ANION GAP: 5 (ref 5–15)
BUN: 15 mg/dL (ref 6–23)
CALCIUM: 8.1 mg/dL — AB (ref 8.4–10.5)
CHLORIDE: 101 mmol/L (ref 96–112)
CO2: 30 mmol/L (ref 19–32)
CREATININE: 1.37 mg/dL — AB (ref 0.50–1.35)
GFR calc Af Amer: 60 mL/min — ABNORMAL LOW (ref >90)
GFR calc non Af Amer: 51 mL/min — ABNORMAL LOW (ref >90)
Glucose, Bld: 94 mg/dL (ref 70–99)
POTASSIUM: 3.5 mmol/L (ref 3.5–5.1)
Sodium: 136 mmol/L (ref 135–145)

## 2014-12-19 MED ORDER — METOPROLOL TARTRATE 50 MG PO TABS
50.0000 mg | ORAL_TABLET | Freq: Two times a day (BID) | ORAL | Status: DC
  Administered 2014-12-19 – 2014-12-20 (×2): 50 mg via ORAL
  Filled 2014-12-19 (×3): qty 1

## 2014-12-19 MED ORDER — WARFARIN SODIUM 4 MG PO TABS
4.0000 mg | ORAL_TABLET | Freq: Once | ORAL | Status: AC
  Administered 2014-12-19: 4 mg via ORAL
  Filled 2014-12-19 (×2): qty 1

## 2014-12-19 MED ORDER — LOSARTAN POTASSIUM 25 MG PO TABS
25.0000 mg | ORAL_TABLET | Freq: Every day | ORAL | Status: DC
  Administered 2014-12-20: 25 mg via ORAL
  Filled 2014-12-19: qty 1

## 2014-12-19 NOTE — Discharge Instructions (Signed)
Information on my medicine - Coumadin   (Warfarin)  Why was Coumadin prescribed for you? Coumadin was prescribed for you because you have a blood clot or a medical condition that can cause an increased risk of forming blood clots. Blood clots can cause serious health problems by blocking the flow of blood to the heart, lung, or brain. Coumadin can prevent harmful blood clots from forming. As a reminder your indication for Coumadin is:   Stroke Prevention Because Of Atrial Fibrillation  What test will check on my response to Coumadin? While on Coumadin (warfarin) you will need to have an INR test regularly to ensure that your dose is keeping you in the desired range. The INR (international normalized ratio) number is calculated from the result of the laboratory test called prothrombin time (PT).  If an INR APPOINTMENT HAS NOT ALREADY BEEN MADE FOR YOU please schedule an appointment to have this lab work done by your health care provider within 7 days. Your INR goal is:  2 to 3.  What  do you need to  know  About  COUMADIN? Take Coumadin (warfarin) exactly as prescribed by your healthcare provider about the same time each day.  DO NOT stop taking without talking to the doctor who prescribed the medication.  Stopping without other blood clot prevention medication to take the place of Coumadin may increase your risk of developing a new clot or stroke.  Get refills before you run out.  What do you do if you miss a dose? If you miss a dose, take it as soon as you remember on the same day then continue your regularly scheduled regimen the next day.  Do not take two doses of Coumadin at the same time.  Important Safety Information A possible side effect of Coumadin (Warfarin) is an increased risk of bleeding. You should call your healthcare provider right away if you experience any of the following: ? Bleeding from an injury or your nose that does not stop. ? Unusual colored urine (red or dark brown)  or unusual colored stools (red or black). ? Unusual bruising for unknown reasons. ? A serious fall or if you hit your head (even if there is no bleeding).  Some foods or medicines interact with Coumadin (warfarin) and might alter your response to warfarin. To help avoid this: ? Eat a balanced diet, maintaining a consistent amount of Vitamin K. ? Notify your provider about major diet changes you plan to make. ? Avoid alcohol or limit your intake to 1 drink for women and 2 drinks for men per day. (1 drink is 5 oz. wine, 12 oz. beer, or 1.5 oz. liquor.)  Make sure that ANY health care provider who prescribes medication for you knows that you are taking Coumadin (warfarin).  Also make sure the healthcare provider who is monitoring your Coumadin knows when you have started a new medication including herbals and non-prescription products.  Coumadin (Warfarin)  Major Drug Interactions  Increased Warfarin Effect Decreased Warfarin Effect  Alcohol (large quantities) Antibiotics (esp. Septra/Bactrim, Flagyl, Cipro) Amiodarone (Cordarone) Aspirin (ASA) Cimetidine (Tagamet) Megestrol (Megace) NSAIDs (ibuprofen, naproxen, etc.) Piroxicam (Feldene) Propafenone (Rythmol SR) Propranolol (Inderal) Isoniazid (INH) Posaconazole (Noxafil) Barbiturates (Phenobarbital) Carbamazepine (Tegretol) Chlordiazepoxide (Librium) Cholestyramine (Questran) Griseofulvin Oral Contraceptives Rifampin Sucralfate (Carafate) Vitamin K   Coumadin (Warfarin) Major Herbal Interactions  Increased Warfarin Effect Decreased Warfarin Effect  Garlic Ginseng Ginkgo biloba Coenzyme Q10 Green tea St. Johns wort    Coumadin (Warfarin) FOOD Interactions  Eat a consistent  number of servings per week of foods HIGH in Vitamin K (1 serving =  cup)  Collards (cooked, or boiled & drained) Kale (cooked, or boiled & drained) Mustard greens (cooked, or boiled & drained) Parsley *serving size only =  cup Spinach (cooked,  or boiled & drained) Swiss chard (cooked, or boiled & drained) Turnip greens (cooked, or boiled & drained)  Eat a consistent number of servings per week of foods MEDIUM-HIGH in Vitamin K (1 serving = 1 cup)  Asparagus (cooked, or boiled & drained) Broccoli (cooked, boiled & drained, or raw & chopped) Brussel sprouts (cooked, or boiled & drained) *serving size only =  cup Lettuce, raw (green leaf, endive, romaine) Spinach, raw Turnip greens, raw & chopped   These websites have more information on Coumadin (warfarin):  FailFactory.se; VeganReport.com.au;

## 2014-12-19 NOTE — Progress Notes (Signed)
Utilization review completed. Vonzella Althaus, RN, BSN. 

## 2014-12-19 NOTE — Progress Notes (Signed)
Benjamin Mckay for warfarin Indication: afib  Allergies  Allergen Reactions  . Statins     Leg cramps.   Tolerates pravastatin.    Marland Kitchen Penicillins Rash    Patient Measurements: Height: 5\' 9"  (175.3 cm) Weight: 231 lb 12.8 oz (105.144 kg) (scale ) IBW/kg (Calculated) : 70.7  Vital Signs: Temp: 97.3 F (36.3 C) (03/07 1014) Temp Source: Oral (03/07 1014) BP: 114/61 mmHg (03/07 1014) Pulse Rate: 67 (03/07 1014)  Labs:  Recent Labs  12/18/14 0129 12/18/14 1420 12/19/14 0552  HGB 14.9  --   --   HCT 43.3  --   --   PLT 191  --   --   LABPROT 26.0*  --  22.3*  INR 2.36*  --  1.93*  CREATININE 1.31 1.34 1.37*    Estimated Creatinine Clearance: 61.7 mL/min (by C-G formula based on Cr of 1.37).   Medical History: Past Medical History  Diagnosis Date  . Atrial fibrillation   . Lymphoma 1998    of colon   . Diverticulosis   . Esophageal stricture   . Hyperlipidemia   . Hypertension   . CVA (cerebral infarction)   . Colon polyps   . Urinary frequency   . Umbilical hernia   . Congenital heart block   . CHB (complete heart block) 11/06/2013  . Cardiomyopathy, ischemic 11/07/2013    Assessment: 69 year old Male recently discharged 12/10/14 for ataxia and dizziness and ARF.He [facility] to [facility] early [facility] today with 10 lb wt gain over 1 day. Transferred to [facility] for further cardiac care.   Patient has history of afib on chronic warfarin. INR down this morning to 1.9 was therapeutic yesterday. No bleeding issues noted. CBC normal.   Warfarin dose pta - 2.5mg  daily  Goal of Therapy:  INR 2-3 Monitor platelets by anticoagulation protocol: Yes   Plan:  Warfarin 4mg  tonight  INR in am - will transition back to home dose if therapeutic  Erin Hearing PharmD., BCPS Clinical Pharmacist Pager [phone] 12/19/2014 11:21 AM

## 2014-12-19 NOTE — Progress Notes (Signed)
    Subjective:  SOB is improved. No CP. Laying more comfortably in bed. Gained 10 pounds in one day.   Objective:  Vital Signs in the last 24 hours: Temp:  [98.4 F (36.9 C)-99.5 F (37.5 C)] 99.5 F (37.5 C) (03/07 0556) Pulse Rate:  [65-67] 65 (03/07 0556) Resp:  [18-20] 18 (03/07 0556) BP: (98-121)/(44-61) 98/44 mmHg (03/07 0556) SpO2:  [94 %-98 %] 94 % (03/07 0556) Weight:  [231 lb 12.8 oz (105.144 kg)] 231 lb 12.8 oz (105.144 kg) (03/07 0556)  Intake/Output from previous day: 03/06 0701 - 03/07 0700 In: 800 [P.O.:800] Out: 2775 [Urine:2775]   Physical Exam: General: Well developed, well nourished, in no acute distress. Head:  Normocephalic and atraumatic. Lungs: Clear to auscultation and percussion. Heart: Normal S1 and S2.  No murmur, rubs or gallops. Pacer noted.  Abdomen: soft, non-tender, positive bowel sounds. Obese Extremities: No clubbing or cyanosis. No edema. Neurologic: Alert and oriented x 3.    Lab Results:  Recent Labs  12/18/14 0129  WBC 11.8*  HGB 14.9  PLT 191    Recent Labs  12/18/14 1420 12/19/14 0552  NA 136 136  K 3.5 3.5  CL 101 101  CO2 31 30  GLUCOSE 121* 94  BUN 14 15  CREATININE 1.34 1.37*   Imaging: Dg Chest Port 1 View  12/18/2014   CLINICAL DATA:  Initial evaluation for congestive heart failure.  EXAM: PORTABLE CHEST - 1 VIEW  COMPARISON:  Prior radiograph from 12/07/2014  FINDINGS: Left-sided pacemaker/ AICD is unchanged. Cardiomegaly is stable. Mediastinal silhouette within normal limits.  Lungs are normally inflated. There is diffuse pulmonary vascular congestion with indistinctness of the interstitial markings, most consistent with pulmonary edema. No definite focal infiltrate. No pleural effusion. There is no pneumothorax.  No acute osseus abnormality para  IMPRESSION: Findings most consistent with acute CHF exacerbation with moderate diffuse pulmonary edema.   Electronically Signed   By: Jeannine Boga M.D.   On:  12/18/2014 01:48   Personally viewed.   Telemetry: V paced. Personally viewed.   Cardiac Studies:  EF 30%  Assessment/Plan:  Principal Problem:   Acute on chronic systolic and diastolic heart failure, NYHA class 3 Active Problems:   HLD   HTN (hypertension)   CAD (coronary artery disease)   Atrial fibrillation, permanent, CHA2DS2VSc score 6   Long term current use of anticoagulant therapy   CHB (complete heart block)   Single chamber St. Jude pacemaker 2013   Cardiomyopathy, ischemic   CHF (congestive heart failure)  69 year old with acute on chronic systolic HF and following issues:  1) Acute on chronic systolic HF  - Lasix 20 IV Q8  - Creat 1.37  - Will use lasix not HCTZ in future  - 30% EF  - Will likely change to PO tomorrow as potential DC.  2) AFIB  - chronic Paced  - anticoagulated  3) Obese  - weight loss  4) Hypotension  - will back down on metoprolol to 50 BID and losartan 25 QD  Monitor   Benjamin Mckay 12/19/2014, 10:03 AM

## 2014-12-20 ENCOUNTER — Telehealth: Payer: Self-pay | Admitting: Cardiovascular Disease

## 2014-12-20 ENCOUNTER — Telehealth: Payer: Self-pay | Admitting: Neurology

## 2014-12-20 ENCOUNTER — Telehealth: Payer: Self-pay | Admitting: Physician Assistant

## 2014-12-20 LAB — PROTIME-INR
INR: 1.65 — ABNORMAL HIGH (ref 0.00–1.49)
Prothrombin Time: 19.7 seconds — ABNORMAL HIGH (ref 11.6–15.2)

## 2014-12-20 LAB — BASIC METABOLIC PANEL
Anion gap: 9 (ref 5–15)
BUN: 17 mg/dL (ref 6–23)
CO2: 34 mmol/L — ABNORMAL HIGH (ref 19–32)
CREATININE: 1.43 mg/dL — AB (ref 0.50–1.35)
Calcium: 8.3 mg/dL — ABNORMAL LOW (ref 8.4–10.5)
Chloride: 95 mmol/L — ABNORMAL LOW (ref 96–112)
GFR, EST AFRICAN AMERICAN: 57 mL/min — AB (ref >90)
GFR, EST NON AFRICAN AMERICAN: 49 mL/min — AB (ref >90)
GLUCOSE: 92 mg/dL (ref 70–99)
Potassium: 3.1 mmol/L — ABNORMAL LOW (ref 3.5–5.1)
Sodium: 138 mmol/L (ref 135–145)

## 2014-12-20 MED ORDER — WARFARIN SODIUM 5 MG PO TABS
5.0000 mg | ORAL_TABLET | Freq: Once | ORAL | Status: DC
  Filled 2014-12-20: qty 1

## 2014-12-20 MED ORDER — ZOLPIDEM TARTRATE 10 MG PO TABS: 5.0000 mg | ORAL_TABLET | Freq: Every evening | ORAL | Status: DC | PRN

## 2014-12-20 MED ORDER — LOSARTAN POTASSIUM 25 MG PO TABS: 25.0000 mg | ORAL_TABLET | Freq: Every day | ORAL | Status: DC

## 2014-12-20 MED ORDER — FUROSEMIDE 20 MG PO TABS: 20.0000 mg | ORAL_TABLET | Freq: Every day | ORAL | Status: DC

## 2014-12-20 MED ORDER — POTASSIUM CHLORIDE CRYS ER 20 MEQ PO TBCR
40.0000 meq | EXTENDED_RELEASE_TABLET | ORAL | Status: AC
  Administered 2014-12-20 (×2): 40 meq via ORAL
  Filled 2014-12-20 (×2): qty 2

## 2014-12-20 MED ORDER — WARFARIN SODIUM 2.5 MG PO TABS: 2.5000 mg | ORAL_TABLET | Freq: Every day | ORAL | Status: DC

## 2014-12-20 MED ORDER — METOPROLOL TARTRATE 50 MG PO TABS: 50.0000 mg | ORAL_TABLET | Freq: Two times a day (BID) | ORAL | Status: DC

## 2014-12-20 NOTE — Telephone Encounter (Signed)
Called back & set for 90# & 1 refill.

## 2014-12-20 NOTE — Discharge Summary (Signed)
CARDIOLOGY DISCHARGE SUMMARY   Patient ID: Benjamin Mckay MRN: [id number] DOB/AGE: Nov 20, 1945 69 y.o.  Admit date: 12/18/2014 Discharge date: 12/23/2014  PCP: Pcp Not In System Primary Cardiologist: Dr. Sallyanne Kuster  Primary Discharge Diagnosis:  Acute on chronic systolic and diastolic CHF, class III - weight 223 pounds at discharge  Secondary Discharge Diagnosis:    HTN (hypertension)   CAD (coronary artery disease)   Atrial fibrillation, permanent, CHA2DS2VSc score 6   Long term current use of anticoagulant therapy   CHB (complete heart block)   Single chamber St. Jude pacemaker 2013   Cardiomyopathy, ischemic   Hypokalemia  Procedures: Chest x-ray  Hospital Course: Benjamin Mckay is a 69 y.o. male with a history of CHF, permanent atrial fibrillation with chronic Coumadin anticoagulation, CVA, and complete heart block with a pacemaker. He had increasing shortness of breath and weight gain and came to the hospital on 12/18/2014, where he was admitted for further evaluation and treatment.  His chest x-ray and history were consistent with CHF exacerbation. He was diuresed with IV Lasix, losing over 6 L and 13 pounds during his hospital stay. As he diuresed, his respiratory status improved. By discharge, supplemental oxygen was no longer required. His discharge weight was 223 pounds.  His renal function and electrolytes were followed closely during his hospital stay. His potassium required supplementation and this was done. His BUN/creatinine were followed with diuresis, discharge labs are below. This will be followed closely as an outpatient.  On 12/20/2014, he was seen by Dr. Marlou Porch and all data were reviewed. No further inpatient workup was indicated. Weight checks at home as well as a low sodium diet were reviewed with the patient and his wife. He will be on daily Lasix at discharge, with additional Lasix to be taken on a when necessary basis. He will have an early follow-up  appointment. He was ambulating without chest pain or shortness of breath and considered stable for discharge, to follow up as an outpatient.  Labs:   Lab Results  Component Value Date   WBC 11.8* 12/18/2014   HGB 14.9 12/18/2014   HCT 43.3 12/18/2014   MCV 97.7 12/18/2014   PLT 191 12/18/2014     Recent Labs Lab 12/20/14 0503  NA 138  K 3.1*  CL 95*  CO2 34*  BUN 17  CREATININE 1.43*  CALCIUM 8.3*  GLUCOSE 92   B NATRIURETIC PEPTIDE  Date/Time Value Ref Range Status  12/18/2014 01:29 AM 363.2* 0.0 - 100.0 pg/mL Final   No results for input(s): INR in the last 72 hours.    Radiology:  Dg Chest Port 1 View 12/18/2014   CLINICAL DATA:  Initial evaluation for congestive heart failure.  EXAM: PORTABLE CHEST - 1 VIEW  COMPARISON:  Prior radiograph from 12/07/2014  FINDINGS: Left-sided pacemaker/ AICD is unchanged. Cardiomegaly is stable. Mediastinal silhouette within normal limits.  Lungs are normally inflated. There is diffuse pulmonary vascular congestion with indistinctness of the interstitial markings, most consistent with pulmonary edema. No definite focal infiltrate. No pleural effusion. There is no pneumothorax.  No acute osseus abnormality para  IMPRESSION: Findings most consistent with acute CHF exacerbation with moderate diffuse pulmonary edema.   Electronically Signed   By: Jeannine Boga M.D.   On: 12/18/2014 01:48    EKG: Atrial fibrillation, ventricular pacing when necessary   FOLLOW UP PLANS AND APPOINTMENTS Allergies  Allergen Reactions  . Statins     Leg cramps.   Tolerates pravastatin.    Marland Kitchen  Penicillins Rash     Medication List    STOP taking these medications        ALPRAZolam 0.25 MG tablet  Commonly known as:  XANAX      TAKE these medications        amiodarone 200 MG tablet  Commonly known as:  PACERONE  Take 1 tablet (200 mg total) by mouth daily.     aspirin 81 MG tablet  Take 81 mg by mouth daily.     FLOMAX 0.4 MG Caps capsule    Generic drug:  tamsulosin  Take 0.4 mg by mouth daily.     furosemide 20 MG tablet  Commonly known as:  LASIX  Take 1 tablet (20 mg total) by mouth daily. Take an extra tablet daily PRN for weight gain > 3 lbs in a day or 5 lbs in a week.     losartan 25 MG tablet  Commonly known as:  COZAAR  Take 1 tablet (25 mg total) by mouth daily.     metoprolol 50 MG tablet  Commonly known as:  LOPRESSOR  Take 1 tablet (50 mg total) by mouth 2 (two) times daily.     pantoprazole 40 MG tablet  Commonly known as:  PROTONIX  Take 1 tablet (40 mg total) by mouth daily.     pravastatin 80 MG tablet  Commonly known as:  PRAVACHOL  Take 1 tablet (80 mg total) by mouth daily.     warfarin 2.5 MG tablet  Commonly known as:  COUMADIN  Take 1 tablet (2.5 mg total) by mouth daily at 6 PM. Take 5 mg 03/08 and 03/09, then resume usual dose.     zolpidem 10 MG tablet  Commonly known as:  AMBIEN  Take 0.5-1 tablets (5-10 mg total) by mouth at bedtime as needed for sleep.        Discharge Instructions    (HEART FAILURE PATIENTS) Call MD:  Anytime you have any of the following symptoms: 1) 3 pound weight gain in 24 hours or 5 pounds in 1 week 2) shortness of breath, with or without a dry hacking cough 3) swelling in the hands, feet or stomach 4) if you have to sleep on extra pillows at night in order to breathe.    Complete by:  As directed      Diet - low sodium heart healthy    Complete by:  As directed      Increase activity slowly    Complete by:  As directed           Follow-up Information    Follow up with Richardson Dopp, PA-C On 12/27/2014.   Specialty:  Physician Assistant   Why:  Cardiology appointment at 8:00 am, please arrive 15 minutes early for paperwork.   Contact information:   [address] [address] [phone]       BRING ALL MEDICATIONS WITH YOU TO FOLLOW UP APPOINTMENTS  Time spent with patient to include physician time:38  min Signed: Rosaria Ferries, PA-C 12/23/2014, 1:04 PM Co-Sign MD

## 2014-12-20 NOTE — Telephone Encounter (Signed)
New message    tcm appt on 3.15.2016 with Richardson Dopp    Per Suanne Marker B pa.

## 2014-12-20 NOTE — Progress Notes (Signed)
Patient Name: Benjamin Mckay [facility] Date of Encounter: 12/20/2014  Principal Problem:   Acute on chronic systolic and diastolic heart failure, NYHA class 3 Active Problems:   HTN (hypertension)   CAD (coronary artery disease)   Atrial fibrillation, permanent, CHA2DS2VSc score 6   Long term current use of anticoagulant therapy   CHB (complete heart block)   Single chamber St. Jude pacemaker 2013   Cardiomyopathy, ischemic   Primary Cardiologist: Dr Sallyanne Kuster  Patient Profile: 69 yo male w/ hx S-CHF, perm afib on coum, CVA, CHB w/ PPM, CAD, admitted 03/06 w/ volume overload.  SUBJECTIVE: Breathing much better. Eats a lot of prepared foods.  OBJECTIVE Filed Vitals:   12/19/14 1014 12/19/14 1302 12/19/14 1900 12/20/14 0456  BP: 114/61 111/61 131/69 108/59  Pulse: 67 68 72 72  Temp: 97.3 F (36.3 C) 97.9 F (36.6 C) 98.1 F (36.7 C) 97.8 F (36.6 C)  TempSrc: Oral Oral Oral Oral  Resp: 18 18 18 18   Height:      Weight:    223 lb 3.2 oz (101.243 kg)  SpO2: 96% 94% 99% 96%    Intake/Output Summary (Last 24 hours) at 12/20/14 0714 Last data filed at 12/20/14 0650  Gross per 24 hour  Intake   1376 ml  Output   4775 ml  Net  -3399 ml   Filed Weights   12/18/14 0704 12/19/14 0556 12/20/14 0456  Weight: 237 lb 6.4 oz (107.684 kg) 231 lb 12.8 oz (105.144 kg) 223 lb 3.2 oz (101.243 kg)    PHYSICAL EXAM General: Well developed, well nourished, male in no acute distress. Head: Normocephalic, atraumatic.  Neck: Supple without bruits, JVD minimal elevation. Lungs:  Resp regular and unlabored, few rales, essentially clear. Heart: RRR, S1, S2, no S3, S4, or murmur; no rub. Abdomen: Soft, non-tender, non-distended, BS + x 4.  Extremities: No clubbing, cyanosis, no edema.  Neuro: Alert and oriented X 3. Moves all extremities spontaneously. Psych: Normal affect.  LABS: CBC:  Recent Labs  12/18/14 0129  WBC 11.8*  HGB 14.9  HCT 43.3  MCV 97.7  PLT 191   INR:  Recent  Labs  12/20/14 0503  INR 5.46*   Basic Metabolic Panel:  Recent Labs  12/18/14 1420 12/19/14 0552 12/20/14 0503  NA 136 136 138  K 3.5 3.5 3.1*  CL 101 101 95*  CO2 31 30 34*  GLUCOSE 121* 94 92  BUN 14 15 17   CREATININE 1.34 1.37* 1.43*  CALCIUM 8.3* 8.1* 8.3*  MG 1.4*  --   --     Recent Labs  12/18/14 0137  TROPIPOC 0.01   BNP:  B NATRIURETIC PEPTIDE  Date/Time Value Ref Range Status  12/18/2014 01:29 AM 363.2* 0.0 - 100.0 pg/mL Final   TELE:  V pacing      Current Medications:  . amiodarone  200 mg Oral Daily  . aspirin  81 mg Oral Daily  . furosemide  20 mg Intravenous Q8H  . losartan  25 mg Oral Daily  . metoprolol  50 mg Oral BID  . pantoprazole  40 mg Oral Daily  . pravastatin  80 mg Oral Daily  . sodium chloride  3 mL Intravenous Q12H  . tamsulosin  0.4 mg Oral Daily  . Warfarin - Pharmacist Dosing Inpatient   Does not apply q1800      ASSESSMENT AND PLAN: 1) Acute on chronic systolic HF - Diuresing on Lasix 20 IV Q8 - Creat 1.43 -  Will use lasix not HCTZ in future - 30% EF - Will likely change to PO today  - Dry weight is unclear but think 220 lbs, this is one of the lowest in the system  - discussed dietary changes, he will decrease prepared foods.  - MD advise on d/c Lasix dose, consider 20 mg bid  2) AFIB - chronic Paced - anticoagulated  - INR < 2.0 today, ?give a dose of lovenox  - wants rx for coumadin sent in at d/c  3) Obese - weight loss  4) Hypotension - lowered meds, now on metoprolol 50 BID and losartan 25 QD   - only got 1 dose of metoprolol 03/07, no losartan  - follow  5) Hypokalemia  - 80 meq total today  6) Hypomagnesemia  - rec'd 2 Gm IV on admission  - consider daily supplement  Plan: Ambulate and see how tolerated, give am dose of Lasix IV, give other am meds and d/c if tolerates well. TCM appt next week w/ BMET  Signed, Rosaria Ferries , PA-C 7:14 AM 12/20/2014  Personally seen and examined.  Agree with above. Lungs clear. HR 80's briefly, paced 60's most of the time.  Tolerating lower dose metoprolol and lower dose ARB. Continue with these doses as outpt.  Discussed weight checks at home, no salt (wife threw away salt shaker). Lasix 20mg  PO QD at home. Instructed him to take extra if weight increases 3 pounds.    DC today.   Candee Furbish, MD

## 2014-12-20 NOTE — Progress Notes (Signed)
Discharge instructins and prescriptions given to pt. Pt verbally acknowledged understanding. Pt to be transported home by family member who is currently at bedside.  Pt to be transported down to lobby per wheelchair. Will monitor   Angeline Slim I 12/20/2014 12:23 PM

## 2014-12-20 NOTE — Telephone Encounter (Signed)
Contacted the patient to discuss benefits and schedule the sleep study ordered for him and he stated he would not be doing the sleep study.

## 2014-12-20 NOTE — Progress Notes (Signed)
Pt transported out of unit per wheelchair by guest services. Nurse informed pt of follow-up visit on 3/15 with PA.  No acute distress noted. Family member at side with pt's personal belongings.   Angeline Slim I 12/20/2014 12:33 PM

## 2014-12-20 NOTE — Telephone Encounter (Signed)
New Message        [facility] calling to clarify the quantity and refill amount for pts Warfarin. Please call back and advise.

## 2014-12-20 NOTE — Progress Notes (Signed)
[place] for warfarin Indication: afib  Allergies  Allergen Reactions  . Statins     Leg cramps.   Tolerates pravastatin.    Marland Kitchen Penicillins Rash    Patient Measurements: Height: 5\' 9"  (175.3 cm) Weight: 223 lb 3.2 oz (101.243 kg) IBW/kg (Calculated) : 70.7  Vital Signs: Temp: 97.8 F (36.6 C) (03/08 0456) Temp Source: Oral (03/08 0456) BP: 108/59 mmHg (03/08 0456) Pulse Rate: 72 (03/08 0456)  Labs:  Recent Labs  12/18/14 0129 12/18/14 1420 12/19/14 0552 12/20/14 0503  HGB 14.9  --   --   --   HCT 43.3  --   --   --   PLT 191  --   --   --   LABPROT 26.0*  --  22.3* 19.7*  INR 2.36*  --  1.93* 1.65*  CREATININE 1.31 1.34 1.37* 1.43*    Estimated Creatinine Clearance: 58 mL/min (by C-G formula based on Cr of 1.43).   Assessment: 69 year old Male recently discharged 12/10/14 for ataxia and dizziness and ARF.He [facility] to [facility] early AM 3/6 with 10 lb wt gain over 1 day. Transferred to [facility] for further cardiac care.   Patient has history of afib on chronic warfarin. INR down this morning to 1.65 was therapeutic on admission. No doses missed, got dose PTA at 3/5, then 2.5 on 3/6 and 4 mg on 3/7. No bleeding issues noted. CBC normal. Per outpt anticoag flowsheets he has been stable and therapeutic on 2.5 mg daily.   Warfarin dose pta - 2.5mg  daily  Goal of Therapy:  INR 2-3   Plan:  Warfarin 5mg  x1 today, then rec to start back on home dose of 2.5 mg daily  Eudelia Bunch, Pharm.D. [phone] 12/20/2014 8:55 AM

## 2014-12-21 NOTE — Telephone Encounter (Signed)
Patient contacted regarding discharge from [facility] on 12/20/14.  Patient understands to follow up with Richardson Dopp, PAC on 12/27/14 at 8 am at [facility] located at [place]. Patient understands discharge instructions? Yes Patient understands medications and regiment? Yes Patient understands to bring all medications to this visit? Yes  The pts only question was: When is the best time of day to take his Lasix dose. The pt is advised to take his Lasix in the morning as this is a diuretic medication and will cause him to urinate more often and if taken to late in the day it could effect his sleep. He verbalized understanding.

## 2014-12-23 ENCOUNTER — Encounter (HOSPITAL_COMMUNITY): Payer: Self-pay | Admitting: Physician Assistant

## 2014-12-23 ENCOUNTER — Telehealth: Payer: Self-pay | Admitting: *Deleted

## 2014-12-23 NOTE — Telephone Encounter (Signed)
-----   Message from Eileen Stanford, [place] sent at 12/15/2014  2:58 PM EST ----- His creat is much better but not completely back to baseline. Will you tell him it is okay to resume his ARB (losartan) but he should continue to hold HCTZ. His INR was also good and he should continue to follow with the coumadin clinic. Thanks Sophya Vanblarcom!

## 2014-12-26 NOTE — Progress Notes (Signed)
Cardiology Office Note   Date:  12/27/2014   ID:  Benjamin Mckay, DOB [birth year], MRN [id number]  PCP:  Robyn Haber, MD  Cardiologist:  Dr. Sanda Klein     Chief Complaint  Patient presents with  . Congestive Heart Failure  . Hospitalization Follow-up     History of Present Illness: Benjamin Mckay is a 69 y.o. male with a hx of chronic AFib on coumadin, CAD without prior documented MI, ischemic CM, systolic HF, Non-Hodgkin's Lymphoma of the colon s/p resection, esophageal stricture, HL, HTN, prior CVA, CHB s/p PPM, carotid stenosis, renal artery stenosis.  It was recommended in the past that Amiodarone be DC'd but the patient chose to remain on it.    Admitted 11/2014 with ARF in the setting of dehydration likely related to a viral illness.  Losartan and Maxzide were held at DC.   He has seen Neuro and sleep study was recommended to assess for OSA.  He has evidence of old lacunar infarct in the bilateral cerebellar hemispheres.  The neurologist has felt that his dizziness is related to his prior cerebellar strokes.    Seen in FU 12/14/14.  Admitted 0/1-7/49 with a/c systolic HF.  DC weight 223 lbs.  He returns for FU.  He is doing well since DC aside from his dizziness.  The patient denies chest pain, shortness of breath, syncope, orthopnea, PND or significant pedal edema.    Studies/Reports Reviewed Today:  Echo 12/08/14 - Moderate to severe LVH. EF 30% to 35%. - Ventricular septum: Septal motion showed abnormal function and dyssynergy. These changes are consistent with right ventricular pacing. - Aortic valve: There was mild regurgitation. - Mitral valve: There was mild regurgitation. - Left atrium: The atrium was mildly to moderately dilated. - Right ventricle: The cavity size was mildly dilated. Wall thickness was normal.  Carotid US 12/08/14 Summary: RICA. 44-96% ICA LICA. 1-39% ICA stenosis.  Myoview 07/2012 Inf and Lat scar, no ischemia, EF 44%; Low Risk    Cardiac Cath 12/2002 LM:  No sig disease LAD:  prox to mid 70%, mid 40% D1:  Ostial prox 80% (Small vessel) D2:  prox 40% D3:  70% LCx: prox 40%, then 40%, mid 40%, OM1 50% RCA:  Ostial prox 40% EF:  45-50%   Past Medical History  Diagnosis Date  . Atrial fibrillation   . Lymphoma 1998    of colon   . Diverticulosis   . Esophageal stricture   . Hyperlipidemia   . Hypertension   . CVA (cerebral infarction)   . Colon polyps   . Urinary frequency   . Umbilical hernia   . Congenital heart block   . CHB (complete heart block) 11/06/2013  . Cardiomyopathy, ischemic 11/07/2013  . Chronic combined systolic and diastolic CHF (congestive heart failure)     Past Surgical History  Procedure Laterality Date  . Appendectomy  1953  . Colectomy      for lymphoma  . Pacemaker placement      replaced 3 x  . Tonsillectomy    . Neck fusion    . Back surgery    . Carotid doppler  11/04/2012    Proximal Rt ICA 50-99% diameter reduction; Lft Bulb demonstrated mild amount homogeneous plaque-not hemodynamically significant; Lft ICA-normal patency.  . Pacemaker generator change  01/31/2012    St Jude [employer] DR RF model [id number] serial [id number]  . Cardiac catheterization  01/06/2003    Recommend medical therapy  . Cardiovascular stress test  07/16/2012    Mild-moderate perfusion defect seen in Basal inferior, Mid inferior, and Apica lateral consistent with infarct/scar. No scintigraphic evidence for inducible myocardial ischemia. No ECG changes. EKG negative for ischemia.  . Transthoracic echocardiogram  11/04/2012    EF 33-35%, systolic function moderately reduced, mild regurg of the aortic and mitral valves.  . Permanent pacemaker generator change N/A 01/31/2012    Procedure: PERMANENT PACEMAKER GENERATOR CHANGE;  Surgeon: Sanda Klein, MD;  Location: [facility]ATH [facility];      Current Outpatient Prescriptions  Medication Sig Dispense Refill  . amiodarone (PACERONE) 200 MG tablet Take 1 tablet  (200 mg total) by mouth daily. 90 tablet 3  . aspirin 81 MG tablet Take 81 mg by mouth daily.      . furosemide (LASIX) 20 MG tablet Take 1 tablet (20 mg total) by mouth daily. Take an extra tablet daily PRN for weight gain > 3 lbs in a day or 5 lbs in a week. 60 tablet 11  . losartan (COZAAR) 25 MG tablet Take 1 tablet (25 mg total) by mouth daily. 30 tablet 11  . metoprolol (LOPRESSOR) 50 MG tablet Take 1 tablet (50 mg total) by mouth 2 (two) times daily. 270 tablet 3  . pantoprazole (PROTONIX) 40 MG tablet Take 1 tablet (40 mg total) by mouth daily. 90 tablet 3  . pravastatin (PRAVACHOL) 80 MG tablet Take 1 tablet (80 mg total) by mouth daily. 90 tablet 3  . Tamsulosin HCl (FLOMAX) 0.4 MG CAPS Take 0.4 mg by mouth daily.     Marland Kitchen warfarin (COUMADIN) 2.5 MG tablet Take 1 tablet (2.5 mg total) by mouth daily at 6 PM. Take 5 mg 03/08 and 03/09, then resume usual dose.    . zolpidem (AMBIEN) 10 MG tablet Take 0.5-1 tablets (5-10 mg total) by mouth at bedtime as needed for sleep. 7 tablet 0   No current facility-administered medications for this visit.    Allergies:   Statins and Penicillins    Social History:  The patient  reports that he has never smoked. He does not have any smokeless tobacco history on file. He reports that he does not drink alcohol or use illicit drugs.   Family History:  The patient's family history includes Diabetes in his maternal grandmother; Heart disease in his mother; Prostate cancer in his father; Stroke in his mother. There is no history of Colon cancer or Heart attack.    ROS:   Please see the history of present illness.   Review of Systems  Neurological: Positive for dizziness.  All other systems reviewed and are negative.    PHYSICAL EXAM: VS:  BP 125/80 mmHg  Pulse 65  Ht 5\' 9"  (1.753 m)  Wt 220 lb (99.791 kg)  BMI 32.47 kg/m2    Wt Readings from Last 3 Encounters:  12/27/14 220 lb (99.791 kg)  12/20/14 223 lb 3.2 oz (101.243 kg)  12/14/14 234 lb  (106.142 kg)     GEN: Well nourished, well developed, in no acute distress HEENT: normal Neck: no JVD, no masses Cardiac:  Normal S1/S2, RRR; no murmur ,  no rubs or gallops, trace edema  Respiratory:  Decreased breath sounds bilaterally, no wheezing, rhonchi or rales. GI: soft, nontender, nondistended, + BS MS: no deformity or atrophy Skin: warm and dry  Neuro:  CNs II-XII intact, Strength and sensation are intact Psych: Normal affect   EKG:  EKG is ordered today.  It demonstrates:   V paced HR 65  Pacer Interrogation: Functioning appropriately.  No high VR episodes noted and he is pacing 99% of the time.     Recent Labs: 12/05/2014: ALT 32; TSH 1.172 12/18/2014: B Natriuretic Peptide 363.2*; Hemoglobin 14.9; Magnesium 1.4*; Platelets 191 12/20/2014: BUN 17; Creatinine 1.43*; Potassium 3.1*; Sodium 138    Lipid Panel    Component Value Date/Time   CHOL 205* 12/08/2014 0235   TRIG 143 12/08/2014 0235   HDL 32* 12/08/2014 0235   CHOLHDL 6.4 12/08/2014 0235   VLDL 29 12/08/2014 0235   LDLCALC 144* 12/08/2014 0235      ASSESSMENT AND PLAN:  Dizziness He has a lot of gait imbalance and lightheadedness that is worse with standing and walking. He feels fine with sitting and lying. Denies spinning or near syncope.  He had orthostatic VS at the neurologist's office and these did not demonstrate any significant findings.  He has HHPT.  He was doing better with this prior to his recent admission.  He has had a hard time resuming his exercises with PT.  He feels like he has taken a few steps back.  I reviewed his neurologist's notes and his recent Head CT.  We interrogated his device today and it is functioning appropriately.  No high VR episodes noted and he is pacing 99% of the time.  I am not sure that Amiodarone would account for these symptoms.  He does not have symptoms of BPPV.  He is V paced most of the time.  However, I do not think any of his symptoms are related to constant V  pacing.  I had a long discussion with the patient and his wife.  I have advised them that I agree with the neurologist and his symptoms are likely coming from his prior cerebellar CVAs.  I have encouraged him to continue with PT.  If his dizziness worsens, he should contact Neuro for earlier FU.    -  I will review with the PharmD whether Amio can cause these symptoms.  Chronic systolic CHF (congestive heart failure) Volume stable. Continue current Rx.  He is NYHA 2.      -  Check BMET, Mg2+ today.   Cardiomyopathy, ischemic Continue beta-blocker and angiotensin receptor blocker.  Would not increase dose b/c of recent dizziness.  BP at target.  I am not sure if he would meet criteria for CRT upgrade.  Also, if his EF remains < 35%, we should consider upgrade to an ICD.  I will review with Dr. Sanda Klein.    Coronary artery disease involving native coronary artery of native heart without angina pectoris No angina.  Continue ASA, angiotensin receptor blocker, beta-blocker.  Chronic atrial fibrillation Rate controlled.  He has been advised to stop Amiodarone in the past.  He chose to remain on it.  I will review with our PharmD to see if Amiodarone can cause any of these symptoms.  Continue coumadin.  Essential hypertension Controlled.  HLD Continue statin.  CHB (complete heart block) s/p Single chamber St. Jude pacemaker 2013 FU with EP as planned.  Carotid stenosis, bilateral  Mod R sided disease by [place] 11/2014.  Continue ASA, Coumadin.  Current medicines are reviewed at length with the patient today.  The patient has concerns regarding medicines.  The following changes have been made:  As above, I will review his Amiodarone with the PharmD.  Labs/ tests ordered today include:   Orders Placed This Encounter  Procedures  . Basic metabolic panel  . Magnesium  .  EKG 12-Lead    Disposition:   FU with Dr. Dani Gobble Croitoru 4 weeks.   Signed, Versie Starks, MHS 12/27/2014 8:24  AM    [facility] [address], [place], [place]  [address] Phone: [phone]; Fax: [phone]

## 2014-12-27 ENCOUNTER — Ambulatory Visit (INDEPENDENT_AMBULATORY_CARE_PROVIDER_SITE_OTHER): Payer: Medicare Other | Admitting: *Deleted

## 2014-12-27 ENCOUNTER — Encounter: Payer: Self-pay | Admitting: Physician Assistant

## 2014-12-27 ENCOUNTER — Telehealth: Payer: Self-pay

## 2014-12-27 ENCOUNTER — Ambulatory Visit (INDEPENDENT_AMBULATORY_CARE_PROVIDER_SITE_OTHER): Payer: Medicare Other | Admitting: Physician Assistant

## 2014-12-27 VITALS — BP 125/80 | HR 65 | Ht 69.0 in | Wt 220.0 lb

## 2014-12-27 DIAGNOSIS — I442 Atrioventricular block, complete: Secondary | ICD-10-CM

## 2014-12-27 DIAGNOSIS — E785 Hyperlipidemia, unspecified: Secondary | ICD-10-CM

## 2014-12-27 DIAGNOSIS — I482 Chronic atrial fibrillation, unspecified: Secondary | ICD-10-CM

## 2014-12-27 DIAGNOSIS — Z950 Presence of cardiac pacemaker: Secondary | ICD-10-CM

## 2014-12-27 DIAGNOSIS — I6523 Occlusion and stenosis of bilateral carotid arteries: Secondary | ICD-10-CM

## 2014-12-27 DIAGNOSIS — I10 Essential (primary) hypertension: Secondary | ICD-10-CM

## 2014-12-27 DIAGNOSIS — I5022 Chronic systolic (congestive) heart failure: Secondary | ICD-10-CM | POA: Diagnosis not present

## 2014-12-27 DIAGNOSIS — I255 Ischemic cardiomyopathy: Secondary | ICD-10-CM

## 2014-12-27 DIAGNOSIS — R42 Dizziness and giddiness: Secondary | ICD-10-CM

## 2014-12-27 DIAGNOSIS — I2510 Atherosclerotic heart disease of native coronary artery without angina pectoris: Secondary | ICD-10-CM

## 2014-12-27 LAB — MDC_IDC_ENUM_SESS_TYPE_INCLINIC
Brady Statistic RV Percent Paced: 99 %
Implantable Pulse Generator Model: 1210
Implantable Pulse Generator Serial Number: 7313697

## 2014-12-27 LAB — BASIC METABOLIC PANEL
BUN: 16 mg/dL (ref 6–23)
CALCIUM: 9.3 mg/dL (ref 8.4–10.5)
CO2: 31 meq/L (ref 19–32)
CREATININE: 1.2 mg/dL (ref 0.40–1.50)
Chloride: 101 mEq/L (ref 96–112)
GFR: 63.8 mL/min (ref >60.00)
GLUCOSE: 90 mg/dL (ref 70–99)
Potassium: 3.6 mEq/L (ref 3.5–5.1)
Sodium: 136 mEq/L (ref 135–145)

## 2014-12-27 LAB — MAGNESIUM: Magnesium: 2 mg/dL (ref 1.5–2.5)

## 2014-12-27 MED ORDER — ZOLPIDEM TARTRATE 10 MG PO TABS: 10.0000 mg | ORAL_TABLET | Freq: Every evening | ORAL | Status: DC | PRN

## 2014-12-27 NOTE — Patient Instructions (Addendum)
Your physician recommends that you continue on your current medications as directed. Please refer to the Current Medication list given to you today.  You have been given an Rx for Ambien 10mg  as needed for sleep. All further refill request will need to be sent to your primary care physician  Lab Today: Bmet, Mag  Follow up as planned in April 2016 with Dr.Croitoru

## 2014-12-27 NOTE — Telephone Encounter (Signed)
Spoke with pt, Claiborne Billings states this was not set up by hospitalist. I sent this to the wrong doctor. Claiborne Billings states Dr. Brigitte Pulse is his doctor. Dr Brigitte Pulse can you review and let me know. Thanks.

## 2014-12-27 NOTE — Progress Notes (Signed)
Pacemaker interrogation only. Pt seeing Benjamin Mckay for dizziness. No ventricular high rate episodes. Pt VP 100% of time. Histogram appropriate for pt activity.  Follow up as planned.

## 2014-12-27 NOTE — Telephone Encounter (Signed)
Benjamin Mckay from [facility]  Need a verbal order to resume home health for P.T. On pt, he just got out of the hospital Please call [phone]

## 2014-12-27 NOTE — Telephone Encounter (Signed)
This should have been set up by hospitalist

## 2014-12-28 ENCOUNTER — Telehealth: Payer: Self-pay | Admitting: Cardiovascular Disease

## 2014-12-28 ENCOUNTER — Telehealth: Payer: Self-pay | Admitting: Neurology

## 2014-12-28 NOTE — Telephone Encounter (Signed)
Dr. Sallyanne Kuster has given VO to continue home care  [facility]

## 2014-12-28 NOTE — Telephone Encounter (Signed)
Talked to [facility] and she took verbal order for PT>

## 2014-12-28 NOTE — Telephone Encounter (Signed)
Galen Daft with [facility] @ [phone], requesting verbal orders to continue Home Health PT.  Please call and advise.

## 2014-12-28 NOTE — Telephone Encounter (Signed)
Sorry - not my pt either- I saw him only once for ataxia and referred him to neurology - looks like his symptoms worsened so he came back 2d later and saw Dr. Everlene Farrier who had pt admitted to the hospital.  Dr. Joseph Art is listed on pt's chart as his PCP. Pt should come in to clinic for hospital f/u OV - for medicare they are going to need this face-to-face encounter and referral detailing what pt needs and why he is homebound - usually medicare won't approve home health referrals without a lot of specific documentation. In addition, medicare guidelines want pt to be follow-up with o/p OV within 2-3d of hosp discharge.

## 2014-12-28 NOTE — Telephone Encounter (Signed)
Ok to continue PT for ataxia?

## 2014-12-28 NOTE — Telephone Encounter (Signed)
You can give VO from me, but original order for PT did not come from me, if still ok with [facility], it's ok

## 2014-12-29 NOTE — Telephone Encounter (Signed)
Left detailed message from Dr. Brigitte Pulse on Voicemail. Advised to call back if she needed.

## 2015-01-10 ENCOUNTER — Telehealth: Payer: Self-pay | Admitting: *Deleted

## 2015-01-10 NOTE — Telephone Encounter (Signed)
Signed order for SN faxed to [facility].

## 2015-01-16 ENCOUNTER — Ambulatory Visit (INDEPENDENT_AMBULATORY_CARE_PROVIDER_SITE_OTHER): Payer: Medicare Other | Admitting: Pharmacist Clinician (PhC)/ Clinical Pharmacy Specialist

## 2015-01-16 DIAGNOSIS — Z7901 Long term (current) use of anticoagulants: Secondary | ICD-10-CM

## 2015-01-16 DIAGNOSIS — I482 Chronic atrial fibrillation, unspecified: Secondary | ICD-10-CM

## 2015-01-16 DIAGNOSIS — I4891 Unspecified atrial fibrillation: Secondary | ICD-10-CM

## 2015-01-16 DIAGNOSIS — I4821 Permanent atrial fibrillation: Secondary | ICD-10-CM

## 2015-01-16 LAB — POCT INR: INR: 3.9

## 2015-01-17 ENCOUNTER — Ambulatory Visit (INDEPENDENT_AMBULATORY_CARE_PROVIDER_SITE_OTHER): Payer: Medicare Other | Admitting: *Deleted

## 2015-01-17 DIAGNOSIS — I482 Chronic atrial fibrillation: Secondary | ICD-10-CM | POA: Diagnosis not present

## 2015-01-17 DIAGNOSIS — I4821 Permanent atrial fibrillation: Secondary | ICD-10-CM

## 2015-01-17 LAB — MDC_IDC_ENUM_SESS_TYPE_REMOTE
Battery Remaining Longevity: 106 mo
Battery Remaining Percentage: 76 %
Brady Statistic RV Percent Paced: 99 %
Date Time Interrogation Session: 20160405080016
Implantable Pulse Generator Model: 1210
Implantable Pulse Generator Serial Number: 7313697
Lead Channel Pacing Threshold Amplitude: 0.75 V
Lead Channel Sensing Intrinsic Amplitude: 12 mV
Lead Channel Setting Pacing Amplitude: 1 V
Lead Channel Setting Pacing Pulse Width: 0.5 ms
Lead Channel Setting Sensing Sensitivity: 2.5 mV
MDC IDC MSMT BATTERY VOLTAGE: 2.95 V
MDC IDC MSMT LEADCHNL RV IMPEDANCE VALUE: 440 Ohm
MDC IDC MSMT LEADCHNL RV PACING THRESHOLD PULSEWIDTH: 0.5 ms

## 2015-01-17 NOTE — Progress Notes (Signed)
Remote pacemaker transmission.   

## 2015-01-19 DIAGNOSIS — I255 Ischemic cardiomyopathy: Secondary | ICD-10-CM | POA: Diagnosis not present

## 2015-01-19 DIAGNOSIS — I442 Atrioventricular block, complete: Secondary | ICD-10-CM

## 2015-01-19 DIAGNOSIS — I6523 Occlusion and stenosis of bilateral carotid arteries: Secondary | ICD-10-CM

## 2015-01-19 DIAGNOSIS — R42 Dizziness and giddiness: Secondary | ICD-10-CM

## 2015-01-19 DIAGNOSIS — I5022 Chronic systolic (congestive) heart failure: Secondary | ICD-10-CM | POA: Diagnosis not present

## 2015-01-19 DIAGNOSIS — Z950 Presence of cardiac pacemaker: Secondary | ICD-10-CM

## 2015-01-19 DIAGNOSIS — I10 Essential (primary) hypertension: Secondary | ICD-10-CM

## 2015-01-19 DIAGNOSIS — I2510 Atherosclerotic heart disease of native coronary artery without angina pectoris: Secondary | ICD-10-CM

## 2015-01-19 DIAGNOSIS — E785 Hyperlipidemia, unspecified: Secondary | ICD-10-CM

## 2015-01-23 ENCOUNTER — Telehealth: Payer: Self-pay | Admitting: Gastroenterology

## 2015-01-23 NOTE — Telephone Encounter (Signed)
Patient has had constipation for about a month. It is a daily issue that he has the urge to go, but eliminates very little and it is "hard". He has tried Orthoptist and Ex-lax. His concerns are that he is a heart patient and he has an umbilical hernia. Patient is on Coumadin. No blood with the bowel movement.

## 2015-01-23 NOTE — Telephone Encounter (Signed)
Patient advised and agrees to try this plan.

## 2015-01-23 NOTE — Telephone Encounter (Signed)
He can try one dose of MiraLAX daily until he is reset by going to the bathroom more easily and more frequently and then can reduce the frequency of MiraLAX

## 2015-01-24 ENCOUNTER — Encounter: Payer: Self-pay | Admitting: Cardiology

## 2015-01-26 ENCOUNTER — Encounter: Payer: Self-pay | Admitting: Cardiovascular Disease

## 2015-01-30 ENCOUNTER — Encounter: Payer: Self-pay | Admitting: Cardiovascular Disease

## 2015-01-30 ENCOUNTER — Ambulatory Visit (INDEPENDENT_AMBULATORY_CARE_PROVIDER_SITE_OTHER): Payer: Medicare Other | Admitting: Pharmacist Clinician (PhC)/ Clinical Pharmacy Specialist

## 2015-01-30 ENCOUNTER — Ambulatory Visit (INDEPENDENT_AMBULATORY_CARE_PROVIDER_SITE_OTHER): Payer: Medicare Other | Admitting: Cardiovascular Disease

## 2015-01-30 VITALS — BP 130/76 | HR 67 | Ht 69.0 in | Wt 216.6 lb

## 2015-01-30 DIAGNOSIS — I482 Chronic atrial fibrillation, unspecified: Secondary | ICD-10-CM

## 2015-01-30 DIAGNOSIS — I4891 Unspecified atrial fibrillation: Secondary | ICD-10-CM | POA: Diagnosis not present

## 2015-01-30 DIAGNOSIS — H8309 Labyrinthitis, unspecified ear: Secondary | ICD-10-CM

## 2015-01-30 DIAGNOSIS — Z7901 Long term (current) use of anticoagulants: Secondary | ICD-10-CM

## 2015-01-30 DIAGNOSIS — I472 Ventricular tachycardia, unspecified: Secondary | ICD-10-CM

## 2015-01-30 DIAGNOSIS — R270 Ataxia, unspecified: Secondary | ICD-10-CM

## 2015-01-30 DIAGNOSIS — I255 Ischemic cardiomyopathy: Secondary | ICD-10-CM | POA: Diagnosis not present

## 2015-01-30 DIAGNOSIS — Z8679 Personal history of other diseases of the circulatory system: Secondary | ICD-10-CM | POA: Diagnosis not present

## 2015-01-30 DIAGNOSIS — I5022 Chronic systolic (congestive) heart failure: Secondary | ICD-10-CM

## 2015-01-30 DIAGNOSIS — I2510 Atherosclerotic heart disease of native coronary artery without angina pectoris: Secondary | ICD-10-CM

## 2015-01-30 DIAGNOSIS — I442 Atrioventricular block, complete: Secondary | ICD-10-CM | POA: Diagnosis not present

## 2015-01-30 DIAGNOSIS — Z79899 Other long term (current) drug therapy: Secondary | ICD-10-CM

## 2015-01-30 DIAGNOSIS — Z950 Presence of cardiac pacemaker: Secondary | ICD-10-CM

## 2015-01-30 DIAGNOSIS — Z45018 Encounter for adjustment and management of other part of cardiac pacemaker: Secondary | ICD-10-CM

## 2015-01-30 DIAGNOSIS — Z45010 Encounter for checking and testing of cardiac pacemaker pulse generator [battery]: Secondary | ICD-10-CM

## 2015-01-30 DIAGNOSIS — I4821 Permanent atrial fibrillation: Secondary | ICD-10-CM

## 2015-01-30 DIAGNOSIS — E785 Hyperlipidemia, unspecified: Secondary | ICD-10-CM

## 2015-01-30 LAB — MDC_IDC_ENUM_SESS_TYPE_INCLINIC
Battery Voltage: 2.98 V
Brady Statistic RV Percent Paced: 99 %
Implantable Pulse Generator Model: 1210
Implantable Pulse Generator Serial Number: 7313697
Lead Channel Pacing Threshold Amplitude: 0.625 V
Lead Channel Pacing Threshold Pulse Width: 0.5 ms
Lead Channel Setting Pacing Amplitude: 0.875
Lead Channel Setting Pacing Pulse Width: 0.5 ms
MDC IDC MSMT BATTERY REMAINING PERCENTAGE: 95 % — AB
MDC IDC MSMT LEADCHNL RV IMPEDANCE VALUE: 430 Ohm
MDC IDC SET LEADCHNL RV SENSING SENSITIVITY: 2.5 mV

## 2015-01-30 LAB — POCT INR: INR: 3.1

## 2015-01-30 NOTE — Progress Notes (Signed)
Patient ID: Benjamin Mckay, male   DOB: [birth year], 69 y.o.   MRN: [id number]     Cardiology Office Note   Date:  01/30/2015   ID:  Benjamin Mckay, DOB [birth year], MRN [id number]  PCP:  Robyn Haber, MD  Cardiologist:   Sanda Klein, MD   Chief Complaint  Patient presents with  . Hospitalization Follow-up    for dizziness and swelling. Patient still reports dizziness and unsteady balance. They would like a referral to ENT.      History of Present Illness: Benjamin Mckay is a 68 y.o. male who presents for follow-up after 2 recent hospitalizations. In late February he developed severe ataxia making it difficult to walk. This was associated with nausea and vomiting. His oral intake of food and fluids drastically decreased and he was admitted with acute renal insufficiency. There was concern for an acute stroke and CT imaging showed evidence of old cerebellar infarctions, but no acute stroke. MRI could not be performed since he has a pacemaker and complete heart block. Other possible diagnoses that were entertained included gastroenteritis and acute labyrinthitis. A repeat echocardiogram showed a slight decrease in left ventricular ejection fraction to 30-35 percent. Carotid Dopplers showed a stable 60-79 percent right internal carotid stenosis. His angiotensin receptor blocker and diuretic were stopped, he received intravenous fluids and was discharged with follow-up with neurology and cardiology.  On March 6 he was readmitted with acute systolic heart failure and pulmonary edema. Responded well to treatment with loop diuretics.  Weight in January prior to acute illness was 232 pounds. When he presented with acute renal failure he weight 222 pounds. At discharge from his initial hospitalization he weighed 226 pounds. He was readmitted with acute heart failure with a weight of 234 pounds and discharged at 223 pounds. Today he weighs 216 pounds.  While his dizziness has not completely  resolved today it is much improved. He is almost back to baseline. He no longer has anorexia, nausea or vomiting.  Prior to the recent events, despite moderately depressed left ventricular systolic function, Jadan had never had problems with congestive heart failure and was not taking loop diuretics. Had NYHA class I--II in January  (chronic shortness of breath climbing stairs, present for many years).   He has known coronary disease with moderate lesions in all major coronary territories but has no complaints of angina and had a low risk nuclear stress test in 2013. His hyperlipidemia has been difficult to treat because of statin intolerance, but he can take pravastatin without major side effects.   He is described is having "long-standing complete heart block". He received his initial pacemaker in 1984 and had his most recent generator change out as well as placement of a new bipolar right ventricular lead in 2013. He has a single-chamber St. Jude pacemaker.  Since roughly 2004 he is described as having "permanent atrial fibrillation".  Although he is described as having complete heart block, it is interesting that in January 2014, when he was seeing Dr. Rollene Fare his pacemaker interrogation showed only 43% ventricular pacing and 57% ventricular sensing. In August 2014 there was 56% ventricular pacing and 44% ventricular sensing. Frequent episodes of high ventricular rate were seen at the time. His underlying rhythm showed "left bundle branch block at 59 bpm" in August 2014. Electrocardiograms performed with temporary pacemaker inhibition show idioventricular rhythm with rates around 40 beats per minutes in 2013.  In August 2014 while watching a golf tournament he had frequent episodes of near-syncope  and palpitations. Dr. Lowella Fairy notes describe the rhythm has most likely being supraventricular since the intracardiac electrogram showed a similar left bundle branch block with his native rhythm and  atrial fibrillation. He did say that he could not be certain whether the rhythm was not monomorphic VT. Therefore he decided to prescribe amiodarone which the patient has taken ever since.  On my review of the intracardiac electrograms, I think the most likely diagnosis was sustained monomorphic ventricular tachycardia with a fairly slow cycle length of 370/390 ms. In fact, similar recordings were seen during tracings from his device in late 2013. Not long after amiodarone was started the episodes of rapid regular tachycardia abated, but he also developed 100% ventricular pacing ever since.  He prefers regular pacing at 65 bpm without rate response sensor.  Interestingly, his son (also named Donnie) has atrial standstill and congenital heart block.    Past Medical History  Diagnosis Date  . Atrial fibrillation   . Lymphoma 1998    of colon   . Diverticulosis   . Esophageal stricture   . Hyperlipidemia   . Hypertension   . CVA (cerebral infarction)   . Colon polyps   . Urinary frequency   . Umbilical hernia   . Congenital heart block   . CHB (complete heart block) 11/06/2013  . Cardiomyopathy, ischemic 11/07/2013  . Chronic combined systolic and diastolic CHF (congestive heart failure)     Past Surgical History  Procedure Laterality Date  . Appendectomy  1953  . Colectomy      for lymphoma  . Pacemaker placement      replaced 3 x  . Tonsillectomy    . Neck fusion    . Back surgery    . Carotid doppler  11/04/2012    Proximal Rt ICA 50-99% diameter reduction; Lft Bulb demonstrated mild amount homogeneous plaque-not hemodynamically significant; Lft ICA-normal patency.  . Pacemaker generator change  01/31/2012    St Jude Med Accent DR RF model [id number] serial [id number]  . Cardiac catheterization  01/06/2003    Recommend medical therapy  . Cardiovascular stress test  07/16/2012    Mild-moderate perfusion defect seen in Basal inferior, Mid inferior, and Apica lateral consistent with  infarct/scar. No scintigraphic evidence for inducible myocardial ischemia. No ECG changes. EKG negative for ischemia.  . Transthoracic echocardiogram  11/04/2012    EF 50-53%, systolic function moderately reduced, mild regurg of the aortic and mitral valves.  . Permanent pacemaker generator change N/A 01/31/2012    Procedure: PERMANENT PACEMAKER GENERATOR CHANGE;  Surgeon: Sanda Klein, MD;  Location: [facility];      Current Outpatient Prescriptions  Medication Sig Dispense Refill  . amiodarone (PACERONE) 200 MG tablet Take 1 tablet (200 mg total) by mouth daily. 90 tablet 3  . aspirin 81 MG tablet Take 81 mg by mouth daily.      . furosemide (LASIX) 20 MG tablet Take 1 tablet (20 mg total) by mouth daily. Take an extra tablet daily PRN for weight gain > 3 lbs in a day or 5 lbs in a week. 60 tablet 11  . losartan (COZAAR) 25 MG tablet Take 1 tablet (25 mg total) by mouth daily. 30 tablet 11  . metoprolol (LOPRESSOR) 50 MG tablet Take 1 tablet (50 mg total) by mouth 2 (two) times daily. 270 tablet 3  . pravastatin (PRAVACHOL) 80 MG tablet Take 1 tablet (80 mg total) by mouth daily. 90 tablet 3  . Tamsulosin HCl (FLOMAX) 0.4 MG  CAPS Take 0.4 mg by mouth daily.     Marland Kitchen warfarin (COUMADIN) 2.5 MG tablet Take 1 tablet (2.5 mg total) by mouth daily at 6 PM. Take 5 mg 03/08 and 03/09, then resume usual dose.     No current facility-administered medications for this visit.    Allergies:   Statins and Penicillins    Social History:  The patient  reports that he has never smoked. He does not have any smokeless tobacco history on file. He reports that he does not drink alcohol or use illicit drugs.   Family History:  The patient's family history includes Diabetes in his maternal grandmother; Heart disease in his mother; Prostate cancer in his father; Stroke in his mother. There is no history of Colon cancer or Heart attack. his son has a permanent pacemaker for atrial standstill and congenital heart  block   ROS:  Please see the history of present illness.    Otherwise, review of systems positive for none.   All other systems are reviewed and negative.    PHYSICAL EXAM: VS:  BP 130/76 mmHg  Pulse 67  Ht 5\' 9"  (1.753 m)  Wt 216 lb 9.6 oz (98.249 kg)  BMI 31.97 kg/m2 , BMI Body mass index is 31.97 kg/(m^2).  General: Alert, oriented x3, no distress Head: no evidence of trauma, PERRL, EOMI, no exophtalmos or lid lag, no myxedema, no xanthelasma; normal ears, nose and oropharynx Neck: normal jugular venous pulsations and no hepatojugular reflux; brisk carotid pulses without delay and no carotid bruits Chest: clear to auscultation, no signs of consolidation by percussion or palpation, normal fremitus, symmetrical and full respiratory excursions Cardiovascular: normal position and quality of the apical impulse, regular rhythm, normal first and second heart sounds, no murmurs, rubs or gallops Abdomen: no tenderness or distention, no masses by palpation, no abnormal pulsatility or arterial bruits, normal bowel sounds, no hepatosplenomegaly Extremities: no clubbing, cyanosis or edema; 2+ radial, ulnar and brachial pulses bilaterally; 2+ right femoral, posterior tibial and dorsalis pedis pulses; 2+ left femoral, posterior tibial and dorsalis pedis pulses; no subclavian or femoral bruits Neurological: grossly nonfocal Psych: euthymic mood, full affect   EKG:  EKG is ordered today. He has background atrial fibrillation and 100% ventricular pacing. The QRS duration is 172 ms. QTC is 471 ms.    Recent Labs: 12/05/2014: ALT 32; TSH 1.172 12/18/2014: B Natriuretic Peptide 363.2*; Hemoglobin 14.9; Platelets 191 12/27/2014: BUN 16; Creatinine 1.20; Magnesium 2.0; Potassium 3.6; Sodium 136    Lipid Panel    Component Value Date/Time   CHOL 205* 12/08/2014 0235   TRIG 143 12/08/2014 0235   HDL 32* 12/08/2014 0235   CHOLHDL 6.4 12/08/2014 0235   VLDL 29 12/08/2014 0235   LDLCALC 144*  12/08/2014 0235      Wt Readings from Last 3 Encounters:  01/30/15 216 lb 9.6 oz (98.249 kg)  12/27/14 220 lb (99.791 kg)  12/20/14 223 lb 3.2 oz (101.243 kg)      Other studies Reviewed: Additional studies/ records that were reviewed today include: History and physical, neurology consultation, progress notes and discharge summaries from previous hospitalizations in March and February.  Multiple pacemaker interrogation records and Dr. Lowella Fairy notes from 2013 and 2014 are reviewed (volume 5 of his paper chart only is available)  A full pacemaker interrogation is performed today. He has a Loss adjuster, chartered RF device implanted in 2013 when he also received the current right ventricular lead. The old unipolar lead is left in  situ. Generator longevity is estimated to be around 12 years. Right ventricular lead impedance is 450 ohms and the pacing threshold was 0.75 V at 0.5 ms. No R waves are detected.   ASSESSMENT AND PLAN:  Letitia Libra has an extremely complex clinical situation.  He appears to have moderate to severe ischemic cardiomyopathy with a left ventricular ejection fraction that is now less than 35% and with a newly developed systolic heart failure. On his current medical regimen he has 100% right ventricular pacing and bears a diagnosis of complete heart block. However in 2014 was a high proportion of ventricular sensed rhythm and it may be that he has 100% ventricular pacing due to amiodarone therapy. Amiodarone was prescribed in August 2014 for rapid ventricular rates that most likely represent monomorphic ventricular tachycardia that was symptomatic (near syncope).  I believe he meets criteria for implantation of a CRT-D system in more ways than one. Since he has symptomatic sustained VT in 2014. Such a device should be implanted for secondary prevention. Even if the arrhythmia from 2014 is deemed not to be VT, he meets primary prevention ICD implantation criteria (MADIT-2). CRT is  indicated for very high, possibly 100% need for right ventricular pacing in the setting of congestive heart failure. Even if native AV conduction would be reestablished by discontinuing amiodarone, he previously had a left bundle branch block during native AV conduction.  For the time being I think should continue treatment with amiodarone since I suspect he had sustained ventricular tachycardia before initiation of this medicine. The patient once to continue it. His wife is concerned that the amiodarone may have something to do with his recent problems with ataxia.  I think we definitely need assistance from an electrophysiologist with these difficult questions and have referred him for consultation.  Multiple other important and complicated issues remain unresolved: - Is he now truly back to euvolemic status - Should he undergo repeat coronary evaluation for the recent decline in left ventricular systolic function - If he undergoes angiography, what is his risk of recurrent acute renal failure (his creatinine is now down to 1.2, close to his baseline of 1.1) - What was the cause of his acute illness with ataxia nausea and vomiting, did he have another stroke. He is receiving therapeutic anticoagulation with warfarin - An outpatient sleep study has been recommended and is probably appropriate, although may not have direct bearing on the current issues described above - Can we improve his long-term prognosis with the better treatment of his hyperlipidemia. The only statin he has tolerated his pravastatin, but he may do well with a PCSK9 inhibitor - Mr. Shovlin requested a referral to ENT to evaluate for labyrinthitis or other causes for his acute illness. This is appropriate and we made the necessary referral    Current medicines are reviewed at length with the patient today.  The patient has concerns regarding medicines. What is possible role of amiodarone in recent illness and is it safe to  discontinue it long-term?  The following changes have been made:  no change  Labs/ tests ordered today include:  Orders Placed This Encounter  Procedures  . Ambulatory referral to Cardiac Electrophysiology  . Ambulatory referral to ENT   Patient Instructions  Your physician recommends that you schedule a follow-up appointment in: 3 Months  You have been referred to Dr Cristopher Peru and Dr Vanice Sarah, MD  01/30/2015 5:14 PM    Sanda Klein, MD, [facility]  [facility] [phone] office [phone] pager

## 2015-01-30 NOTE — Patient Instructions (Signed)
Your physician recommends that you schedule a follow-up appointment in: 3 Months  You have been referred to Dr Cristopher Peru and Dr Erik Obey

## 2015-01-31 ENCOUNTER — Telehealth: Payer: Self-pay | Admitting: Cardiovascular Disease

## 2015-01-31 NOTE — Telephone Encounter (Signed)
She is still waiting to get orders back form 01-02-15 please.

## 2015-01-31 NOTE — Telephone Encounter (Signed)
Returned call to [facility] with [place] she stated she will re fax orders she needs Dr.Croitoru to sign.Message sent to [facility].

## 2015-02-01 NOTE — Addendum Note (Signed)
Addended byChauncy Lean. on: 02/01/2015 01:04 PM   Modules accepted: Orders

## 2015-02-06 ENCOUNTER — Telehealth: Payer: Self-pay | Admitting: Cardiovascular Disease

## 2015-02-06 NOTE — Telephone Encounter (Signed)
Not sure. He was hospitalized with ataxia in February (balance PT?) and CHF in March. Not sure when order was generated

## 2015-02-06 NOTE — Telephone Encounter (Signed)
Anderson Malta calling regarding a home health order request for physical therapy that was sent 3/21.   Please advise.

## 2015-02-07 ENCOUNTER — Encounter: Payer: Self-pay | Admitting: Cardiovascular Disease

## 2015-02-08 ENCOUNTER — Telehealth: Payer: Self-pay

## 2015-02-08 NOTE — Telephone Encounter (Signed)
Spoke to patient and rescheduled his appt from 03/07/15 to 03/10/15 due to template change.

## 2015-02-13 ENCOUNTER — Telehealth: Payer: Self-pay | Admitting: *Deleted

## 2015-02-13 NOTE — Telephone Encounter (Signed)
Signed order for evaluation and care by [facility].

## 2015-02-20 ENCOUNTER — Ambulatory Visit: Payer: Medicare Other | Admitting: Pharmacist Clinician (PhC)/ Clinical Pharmacy Specialist

## 2015-02-20 ENCOUNTER — Ambulatory Visit (INDEPENDENT_AMBULATORY_CARE_PROVIDER_SITE_OTHER): Payer: Medicare Other | Admitting: Pharmacist Clinician (PhC)/ Clinical Pharmacy Specialist

## 2015-02-20 DIAGNOSIS — Z7901 Long term (current) use of anticoagulants: Secondary | ICD-10-CM

## 2015-02-20 DIAGNOSIS — I4821 Permanent atrial fibrillation: Secondary | ICD-10-CM

## 2015-02-20 DIAGNOSIS — I482 Chronic atrial fibrillation, unspecified: Secondary | ICD-10-CM

## 2015-02-20 DIAGNOSIS — I4891 Unspecified atrial fibrillation: Secondary | ICD-10-CM

## 2015-02-20 LAB — POCT INR: INR: 3.4

## 2015-03-02 ENCOUNTER — Encounter: Payer: Self-pay | Admitting: Internal Medicine

## 2015-03-02 ENCOUNTER — Ambulatory Visit (INDEPENDENT_AMBULATORY_CARE_PROVIDER_SITE_OTHER): Payer: Medicare Other | Admitting: Internal Medicine

## 2015-03-02 VITALS — BP 106/60 | HR 65 | Ht 69.0 in | Wt 213.0 lb

## 2015-03-02 DIAGNOSIS — I482 Chronic atrial fibrillation, unspecified: Secondary | ICD-10-CM

## 2015-03-02 DIAGNOSIS — I442 Atrioventricular block, complete: Secondary | ICD-10-CM

## 2015-03-02 DIAGNOSIS — I10 Essential (primary) hypertension: Secondary | ICD-10-CM | POA: Diagnosis not present

## 2015-03-02 DIAGNOSIS — I5022 Chronic systolic (congestive) heart failure: Secondary | ICD-10-CM

## 2015-03-02 LAB — CUP PACEART INCLINIC DEVICE CHECK
Battery Remaining Longevity: 141.6 mo
Brady Statistic RV Percent Paced: 99.99 %
Lead Channel Impedance Value: 450 Ohm
Lead Channel Pacing Threshold Amplitude: 0.75 V
Lead Channel Setting Sensing Sensitivity: 2.5 mV
MDC IDC MSMT BATTERY VOLTAGE: 2.98 V
MDC IDC MSMT LEADCHNL RV PACING THRESHOLD PULSEWIDTH: 0.5 ms
MDC IDC PG SERIAL: 7313697
MDC IDC SESS DTM: 20160519153905
MDC IDC SET LEADCHNL RV PACING AMPLITUDE: 0.875
MDC IDC SET LEADCHNL RV PACING PULSEWIDTH: 0.5 ms
Pulse Gen Model: 1210

## 2015-03-02 NOTE — Assessment & Plan Note (Signed)
Evaluating the patient's heart failure is difficult as he is not a particularly good historian. While he does not have much in the way of dyspnea, he is clearly fatigued and has lack of energy, mostly low output type symptoms. He is on maximal medical therapy. We discussed the treatment options with regard to his heart failure and upgrade to biventricular device. Because he has left ventricular dysfunction, sustained monomorphic ventricular tachycardia, and pacing induced left bundle branch block, he would be a candidate for biventricular ICD. Attain this goal however will not be straightforward. The patient has 3 leads in his heart and I reviewed his chest x-ray today which demonstrates one of his lead has been either cut or broken and appears to have retracted into the central circulation. The patient has no escape rhythm. Ideally complete extraction with insertion of a new system would be the best option. There would be difficulty in this approach however because his leads have been present for 30 years. I've explained this to the patient and his daughter who is with him today. I would like to discuss with his primary cardiologist. We discovered the alternative of a right-sided implant which would still result in 5 leads in the superior vena cava and 3 leads going across the tricuspid valve. Finally, I'm concerned about infection of his left-sided pocket as this would be the fifth operation in the pocket.

## 2015-03-02 NOTE — Assessment & Plan Note (Signed)
His blood pressure appears to be well-controlled. No change in medications.

## 2015-03-02 NOTE — Progress Notes (Signed)
HPI Benjamin Mckay is referred today for consideration of upgrade from a single chamber pacemaker to a biventricular ICD. The patient isn't very pleasant 69 year old man whose history dates back to 1984 when he presented with complete heart block and underwent permanent pacemaker insertion. He subsequently developed atrial fibrillation. He underwent right ventricular lead revision 3 years ago during his fourth pacemaker insertion. He had a pacemaker change out in 1995 and again in 2004. The patient has been stable but developed ventricular tachycardia in 2013 which was treated with amiodarone. Since then, his left ventricular function has gone down, now at 30%. The patient denies shortness of breath but does note fatigue and weakness and states that he gets tired easier. No anginal symptoms. He appears to have tolerated amiodarone quite well, and has had no additional ventricular tachycardia. He denies syncope. Allergies  Allergen Reactions  . Statins     Leg cramps.   Tolerates pravastatin.    Marland Kitchen Penicillins Rash     Current Outpatient Prescriptions  Medication Sig Dispense Refill  . amiodarone (PACERONE) 200 MG tablet Take 1 tablet (200 mg total) by mouth daily. 90 tablet 3  . aspirin 81 MG tablet Take 81 mg by mouth daily.      . furosemide (LASIX) 20 MG tablet Take 1 tablet (20 mg total) by mouth daily. Take an extra tablet daily PRN for weight gain > 3 lbs in a day or 5 lbs in a week. 60 tablet 11  . losartan (COZAAR) 25 MG tablet Take 1 tablet (25 mg total) by mouth daily. 30 tablet 11  . metoprolol (LOPRESSOR) 50 MG tablet Take 1 tablet (50 mg total) by mouth 2 (two) times daily. 270 tablet 3  . pravastatin (PRAVACHOL) 80 MG tablet Take 1 tablet (80 mg total) by mouth daily. 90 tablet 3  . Tamsulosin HCl (FLOMAX) 0.4 MG CAPS Take 0.4 mg by mouth daily.     Marland Kitchen warfarin (COUMADIN) 2.5 MG tablet Take 1 tablet (2.5 mg total) by mouth daily at 6 PM. Take 5 mg 03/08 and 03/09, then resume  usual dose.     No current facility-administered medications for this visit.     Past Medical History  Diagnosis Date  . Atrial fibrillation   . Lymphoma 1998    of colon   . Diverticulosis   . Esophageal stricture   . Hyperlipidemia   . Hypertension   . CVA (cerebral infarction)   . Colon polyps   . Urinary frequency   . Umbilical hernia   . Congenital heart block   . CHB (complete heart block) 11/06/2013  . Cardiomyopathy, ischemic 11/07/2013  . Chronic combined systolic and diastolic CHF (congestive heart failure)     ROS:   All systems reviewed and negative except as noted in the HPI.   Past Surgical History  Procedure Laterality Date  . Appendectomy  1953  . Colectomy      for lymphoma  . Pacemaker placement      replaced 3 x  . Tonsillectomy    . Neck fusion    . Back surgery    . Carotid doppler  11/04/2012    Proximal Rt ICA 50-99% diameter reduction; Lft Bulb demonstrated mild amount homogeneous plaque-not hemodynamically significant; Lft ICA-normal patency.  . Pacemaker generator change  01/31/2012    St Jude Med Accent DR RF model [id number] serial [id number]  . Cardiac catheterization  01/06/2003    Recommend medical therapy  . Cardiovascular  stress test  07/16/2012    Mild-moderate perfusion defect seen in Basal inferior, Mid inferior, and Apica lateral consistent with infarct/scar. No scintigraphic evidence for inducible myocardial ischemia. No ECG changes. EKG negative for ischemia.  . Transthoracic echocardiogram  11/04/2012    EF 47-42%, systolic function moderately reduced, mild regurg of the aortic and mitral valves.  . Permanent pacemaker generator change N/A 01/31/2012    Procedure: PERMANENT PACEMAKER GENERATOR CHANGE;  Surgeon: Sanda Klein, MD;  Location: [facility];      Family History  Problem Relation Age of Onset  . Prostate cancer Father   . Colon cancer Neg Hx   . Diabetes Maternal Grandmother   . Heart disease Mother   . Stroke Mother     . Heart attack Neg Hx      History   Social History  . Marital Status: Married    Spouse Name: [employer]  . Number of Children: 3  . Years of Education: BS   Occupational History  . Retired    Social History Main Topics  . Smoking status: Never Smoker   . Smokeless tobacco: Not on file  . Alcohol Use: No  . Drug Use: No  . Sexual Activity: Not on file   Other Topics Concern  . Not on file   Social History Narrative   Consumes caffeine 2 cups per day       BP 106/60 mmHg  Pulse 65  Ht 5\' 9"  (1.753 m)  Wt 213 lb (96.616 kg)  BMI 31.44 kg/m2  Physical Exam:  Well appearing 69 year old man, NAD HEENT: Unremarkable Neck:  7 cm JVD, no thyromegally Lymphatics:  No adenopathy Back:  No CVA tenderness Lungs:  Clear, with no wheezes, rales, or rhonchi. Multiple pacemaker scars appear well healed, and the skin is not thin. HEART:  Regular rate rhythm, no murmurs, no rubs, no clicks Abd:  soft, positive bowel sounds, no organomegally, no rebound, no guarding Ext:  2 plus pulses, no edema, no cyanosis, no clubbing Skin:  No rashes no nodules Neuro:  CN II through XII intact, motor grossly intact  EKG - atrial fibrillation with ventricular pacing  DEVICE  Normal device function.  See PaceArt for details.   Assess/Plan:

## 2015-03-02 NOTE — Assessment & Plan Note (Signed)
His ventricular rate is well controlled. He will continue his chronic systemic anticoagulation.

## 2015-03-02 NOTE — Patient Instructions (Signed)
Medication Instructions:  Your physician recommends that you continue on your current medications as directed. Please refer to the Current Medication list given to you today.  Labwork: None ordered  Testing/Procedures: None ordered  Follow-Up: Dr. Lovena Le is going to talk with Dr. Sallyanne Kuster, and we will be in touch with you once they have discussed plan.   Thank you for choosing [facility]!!

## 2015-03-07 ENCOUNTER — Ambulatory Visit: Payer: Medicare Other | Admitting: Neurology

## 2015-03-09 ENCOUNTER — Ambulatory Visit (INDEPENDENT_AMBULATORY_CARE_PROVIDER_SITE_OTHER): Payer: Medicare Other | Admitting: Pharmacist Clinician (PhC)/ Clinical Pharmacy Specialist

## 2015-03-09 DIAGNOSIS — I4821 Permanent atrial fibrillation: Secondary | ICD-10-CM

## 2015-03-09 DIAGNOSIS — I482 Chronic atrial fibrillation, unspecified: Secondary | ICD-10-CM

## 2015-03-09 DIAGNOSIS — Z7901 Long term (current) use of anticoagulants: Secondary | ICD-10-CM | POA: Diagnosis not present

## 2015-03-09 DIAGNOSIS — I4891 Unspecified atrial fibrillation: Secondary | ICD-10-CM

## 2015-03-09 LAB — POCT INR: INR: 2.8

## 2015-03-10 ENCOUNTER — Ambulatory Visit: Payer: Self-pay | Admitting: Neurology

## 2015-04-07 ENCOUNTER — Ambulatory Visit (INDEPENDENT_AMBULATORY_CARE_PROVIDER_SITE_OTHER): Payer: Medicare Other | Admitting: Pharmacist

## 2015-04-07 ENCOUNTER — Ambulatory Visit: Payer: Medicare Other | Admitting: Pharmacist Clinician (PhC)/ Clinical Pharmacy Specialist

## 2015-04-07 DIAGNOSIS — Z7901 Long term (current) use of anticoagulants: Secondary | ICD-10-CM | POA: Diagnosis not present

## 2015-04-07 DIAGNOSIS — I482 Chronic atrial fibrillation, unspecified: Secondary | ICD-10-CM

## 2015-04-07 DIAGNOSIS — I4891 Unspecified atrial fibrillation: Secondary | ICD-10-CM | POA: Diagnosis not present

## 2015-04-07 DIAGNOSIS — I4821 Permanent atrial fibrillation: Secondary | ICD-10-CM

## 2015-04-07 LAB — POCT INR: INR: 2.6

## 2015-05-17 ENCOUNTER — Ambulatory Visit (INDEPENDENT_AMBULATORY_CARE_PROVIDER_SITE_OTHER): Payer: Medicare Other | Admitting: Pharmacist Clinician (PhC)/ Clinical Pharmacy Specialist

## 2015-05-17 ENCOUNTER — Ambulatory Visit (INDEPENDENT_AMBULATORY_CARE_PROVIDER_SITE_OTHER): Payer: Medicare Other | Admitting: Cardiovascular Disease

## 2015-05-17 VITALS — BP 116/68 | HR 65 | Resp 16 | Ht 69.0 in | Wt 207.9 lb

## 2015-05-17 DIAGNOSIS — I482 Chronic atrial fibrillation, unspecified: Secondary | ICD-10-CM

## 2015-05-17 DIAGNOSIS — I5022 Chronic systolic (congestive) heart failure: Secondary | ICD-10-CM | POA: Diagnosis not present

## 2015-05-17 DIAGNOSIS — Z79899 Other long term (current) drug therapy: Secondary | ICD-10-CM | POA: Diagnosis not present

## 2015-05-17 DIAGNOSIS — Z950 Presence of cardiac pacemaker: Secondary | ICD-10-CM

## 2015-05-17 DIAGNOSIS — I255 Ischemic cardiomyopathy: Secondary | ICD-10-CM

## 2015-05-17 DIAGNOSIS — I442 Atrioventricular block, complete: Secondary | ICD-10-CM | POA: Diagnosis not present

## 2015-05-17 DIAGNOSIS — Z7901 Long term (current) use of anticoagulants: Secondary | ICD-10-CM

## 2015-05-17 DIAGNOSIS — I25118 Atherosclerotic heart disease of native coronary artery with other forms of angina pectoris: Secondary | ICD-10-CM

## 2015-05-17 DIAGNOSIS — I4891 Unspecified atrial fibrillation: Secondary | ICD-10-CM

## 2015-05-17 DIAGNOSIS — Z8679 Personal history of other diseases of the circulatory system: Secondary | ICD-10-CM

## 2015-05-17 DIAGNOSIS — I4821 Permanent atrial fibrillation: Secondary | ICD-10-CM

## 2015-05-17 LAB — POCT INR: INR: 2.3

## 2015-05-17 NOTE — Patient Instructions (Signed)
Your physician wants you to follow-up in: 6 Months. You will receive a reminder letter in the mail two months in advance. If you don't receive a letter, please call our office to schedule the follow-up appointment.   Remote monitoring is used to monitor your Pacemaker of ICD from home. This monitoring reduces the number of office visits required to check your device to one time per year. It allows [place] to keep an eye on the functioning of your device to ensure it is working properly. You are scheduled for a device check from home on 08/15/2015. You may send your transmission at any time that day. If you have a wireless device, the transmission will be sent automatically. After your physician reviews your transmission, you will receive a postcard with your next transmission date.  Your physician recommends that you return for lab work CMP and TSH

## 2015-05-18 ENCOUNTER — Encounter: Payer: Self-pay | Admitting: *Deleted

## 2015-05-18 LAB — COMPLETE METABOLIC PANEL WITH GFR
ALBUMIN: 4.1 g/dL (ref 3.6–5.1)
ALK PHOS: 99 U/L (ref 40–115)
ALT: 31 U/L (ref 9–46)
AST: 25 U/L (ref 10–35)
BILIRUBIN TOTAL: 1.4 mg/dL — AB (ref 0.2–1.2)
BUN: 21 mg/dL (ref 7–25)
CALCIUM: 9.4 mg/dL (ref 8.6–10.3)
CHLORIDE: 101 mmol/L (ref 98–110)
CO2: 26 mmol/L (ref 20–31)
Creat: 1.27 mg/dL — ABNORMAL HIGH (ref 0.70–1.25)
GFR, EST AFRICAN AMERICAN: 66 mL/min (ref >=60)
GFR, Est Non African American: 57 mL/min — ABNORMAL LOW (ref >=60)
Glucose, Bld: 83 mg/dL (ref 65–99)
POTASSIUM: 4.5 mmol/L (ref 3.5–5.3)
Sodium: 139 mmol/L (ref 135–146)
TOTAL PROTEIN: 7 g/dL (ref 6.1–8.1)

## 2015-05-18 LAB — TSH: TSH: 1.43 u[IU]/mL (ref 0.350–4.500)

## 2015-05-19 ENCOUNTER — Encounter: Payer: Self-pay | Admitting: Cardiovascular Disease

## 2015-05-19 NOTE — Progress Notes (Addendum)
Patient ID: Benjamin Mckay, male   DOB: 05/16/1946, 69 y.o.   MRN: [id number]     Cardiology Office Note   Date:  05/19/2015   ID:  Benjamin Mckay, DOB November 24, 1945, MRN [id number]  PCP:  Robyn Haber, MD  Cardiologist:   Sanda Klein, MD   Chief Complaint  Patient presents with  . Follow-up      History of Present Illness: Benjamin Mckay is a 69 y.o. male who presents for follow-up for ischemic cardiomyopathy with congestive heart failure, permanent atrial fibrillation, complete heart block and a history of nonsustained VT.  He was evaluated by Dr. Cristopher Peru for possible upgrade of his pacemaker to a biventricular ICD system. After discussing the risk/benefit balance of this procedure decision was made to proceed conservatively. He has had multiple previous pacemaker procedures (5 procedures on the left side) and is at high risk of infection, mechanical complications and even death with intervention, especially since he is pacemaker dependent. The decision was made to proceed conservatively. He does have complaints of fatigue and poor stamina, although he denies dyspnea. He was able to play golf earlier today. He states that leg weakness and pain seems to be a bigger limitation to physical activity than shortness of breath.  He has not had acute heart failure exacerbation since March 2016. His problems with ataxia and dizziness seem to have improved substantially. Device interrogation today shows normal function. He does not have any escape rhythm. He has 100% ventricular pacing (programmed VVIR). Battery voltage is 2.99 V with estimated generator longevity of 10-12 years. Right ventricular lead impedance is 450 ohms, right ventricular lead threshold is 0.65 @ 0.5 ms pulse width. No episodes of ventricular tachycardia have occurred.  Past Medical History  Diagnosis Date  . Atrial fibrillation   . Lymphoma 1998    of colon   . Diverticulosis   . Esophageal stricture   .  Hyperlipidemia   . Hypertension   . CVA (cerebral infarction)   . Colon polyps   . Urinary frequency   . Umbilical hernia   . Congenital heart block   . CHB (complete heart block) 11/06/2013  . Cardiomyopathy, ischemic 11/07/2013  . Chronic combined systolic and diastolic CHF (congestive heart failure)     Past Surgical History  Procedure Laterality Date  . Appendectomy  1953  . Colectomy      for lymphoma  . Pacemaker placement      replaced 3 x  . Tonsillectomy    . Neck fusion    . Back surgery    . Carotid doppler  11/04/2012    Proximal Rt ICA 50-99% diameter reduction; Lft Bulb demonstrated mild amount homogeneous plaque-not hemodynamically significant; Lft ICA-normal patency.  . Pacemaker generator change  01/31/2012    St Jude Med Accent DR RF model [id number] serial [phone]  . Cardiac catheterization  01/06/2003    Recommend medical therapy  . Cardiovascular stress test  07/16/2012    Mild-moderate perfusion defect seen in Basal inferior, Mid inferior, and Apica lateral consistent with infarct/scar. No scintigraphic evidence for inducible myocardial ischemia. No ECG changes. EKG negative for ischemia.  . Transthoracic echocardiogram  11/04/2012    EF 22-63%, systolic function moderately reduced, mild regurg of the aortic and mitral valves.  . Permanent pacemaker generator change N/A 01/31/2012    Procedure: PERMANENT PACEMAKER GENERATOR CHANGE;  Surgeon: Sanda Klein, MD;  Location: [facility];      Current Outpatient Prescriptions  Medication Sig Dispense Refill  .  amiodarone (PACERONE) 200 MG tablet Take 1 tablet (200 mg total) by mouth daily. 90 tablet 3  . aspirin 81 MG tablet Take 81 mg by mouth daily.      . furosemide (LASIX) 20 MG tablet Take 1 tablet (20 mg total) by mouth daily. Take an extra tablet daily PRN for weight gain > 3 lbs in a day or 5 lbs in a week. 60 tablet 11  . losartan (COZAAR) 25 MG tablet Take 1 tablet (25 mg total) by mouth daily. 30 tablet 11    . metoprolol (LOPRESSOR) 50 MG tablet Take 1 tablet (50 mg total) by mouth 2 (two) times daily. 270 tablet 3  . pravastatin (PRAVACHOL) 80 MG tablet Take 1 tablet (80 mg total) by mouth daily. 90 tablet 3  . Tamsulosin HCl (FLOMAX) 0.4 MG CAPS Take 0.4 mg by mouth daily.     Marland Kitchen warfarin (COUMADIN) 2.5 MG tablet Take 1 tablet (2.5 mg total) by mouth daily at 6 PM. Take 5 mg 03/08 and 03/09, then resume usual dose.     No current facility-administered medications for this visit.    Allergies:   Statins and Penicillins    Social History:  The patient  reports that he has never smoked. He does not have any smokeless tobacco history on file. He reports that he does not drink alcohol or use illicit drugs.   Family History:  The patient's family history includes Diabetes in his maternal grandmother; Heart disease in his mother; Prostate cancer in his father; Stroke in his mother. There is no history of Colon cancer or Heart attack.    ROS:  Please see the history of present illness.    Otherwise, review of systems positive for none.   All other systems are reviewed and negative.    PHYSICAL EXAM: VS:  BP 116/68 mmHg  Pulse 65  Ht 5\' 9"  (1.753 m)  Wt 207 lb 14.4 oz (94.303 kg)  BMI 30.69 kg/m2 , BMI Body mass index is 30.69 kg/(m^2).  General: Alert, oriented x3, no distress Head: no evidence of trauma, PERRL, EOMI, no exophtalmos or lid lag, no myxedema, no xanthelasma; normal ears, nose and oropharynx Neck: normal jugular venous pulsations and no hepatojugular reflux; brisk carotid pulses without delay and no carotid bruits Chest: clear to auscultation, no signs of consolidation by percussion or palpation, normal fremitus, symmetrical and full respiratory excursions, healthy appearance of the left subclavian pacemaker site  Cardiovascular: normal position and quality of the apical impulse, regular rhythm, normal first and  paradoxically split second heart sounds, no  murmurs, rubs or  gallops Abdomen: no tenderness or distention, no masses by palpation, no abnormal pulsatility or arterial bruits, normal bowel sounds, no hepatosplenomegaly Extremities: no clubbing, cyanosis or edema; 2+ radial, ulnar and brachial pulses bilaterally; 2+ right femoral, posterior tibial and dorsalis pedis pulses; 2+ left femoral, posterior tibial and dorsalis pedis pulses; no subclavian or femoral bruits Neurological: grossly nonfocal Psych: euthymic mood, full affect   EKG:  EKG is not ordered today.  Recent Labs: 12/18/2014: B Natriuretic Peptide 363.2*; Hemoglobin 14.9; Platelets 191 12/27/2014: Magnesium 2.0 05/17/2015: ALT 31; BUN 21; Creat 1.27*; Potassium 4.5; Sodium 139; TSH 1.430    Lipid Panel    Component Value Date/Time   CHOL 205* 12/08/2014 0235   TRIG 143 12/08/2014 0235   HDL 32* 12/08/2014 0235   CHOLHDL 6.4 12/08/2014 0235   VLDL 29 12/08/2014 0235   LDLCALC 144* 12/08/2014 0235  Wt Readings from Last 3 Encounters:  05/17/15 207 lb 14.4 oz (94.303 kg)  03/02/15 213 lb (96.616 kg)  01/30/15 216 lb 9.6 oz (98.249 kg)      Other studies Reviewed: Additional studies/ records that were reviewed today includrecords from Dr. Cristopher Peru, pacemaker check from May 19  ASSESSMENT AND PLAN:  1. Complete heart block, pacemaker dependent. No detectable escape rhythm on his last pacemaker check. Before initiation of amiodarone therapy he did have roughly 50% ventricular sensed rhythm. After evaluation by electrophysiology, it is felt that the best course at this time remains medical therapy and conventional pacemaker. The risks of upgrading to CRT-P or CRT-D are high   2. History of sustained symptomatic ventricular tachycardia without recurrence since initiation of amiodarone. Check liver function tests and thyroid function tests every 6 months, ophthalmological exam yearly, avoid sun exposure, report respiratory symptoms promptly.   3. Moderate to severe ischemic  cardiomyopathy, most recent LVEF 30-35 percent . Right ventricular pacing may be contributing to reduction in left ventricular systolic function. He does not have angina pectoris at this time. His most recent assessment for ischemia was a nuclear perfusion study performed in October 2013 showing a inferior scar without ischemia and ejection fraction of 44%. Ejection fraction of 54% was documented in 2010, when he had a lower burden of ventricular pacing.   4. Chronic combined systolic and diastolic heart failure, currently NYHA functional class I-II, clinically euvolemic. Continue treatment with beta blockers and angiotensin receptor blocker. He only requires low doses of loop diuretic.  5. CAD with moderate diffuse lesions by previous coronary angiography   6.  Normally functioning single chamber permanent pacemaker (St. Jude Accent SR RF) with a relatively new bipolar ventricular lead. Remote monitoring every 3 months and office check in 6 months.   7. Permanent atrial fibrillation on warfarin anticoagulation    8. Chronic 60-79% right internal carotid artery stenosis; CT evidence of old cerebellar infarctions   9. Ataxia of uncertain etiology, improved  8. Chronic kidney disease, stage II with recent episode of acute renal failure related to nausea and vomiting    Current medicines are reviewed at length with the patient today.  The patient does not have concerns regarding medicines.  The following changes have been made:  no change  Labs/ tests ordered today include:  Orders Placed This Encounter  Procedures  . COMPLETE METABOLIC PANEL WITH GFR  . TSH    Patient Instructions  Your physician wants you to follow-up in: 6 Months. You will receive a reminder letter in the mail two months in advance. If you don't receive a letter, please call our office to schedule the follow-up appointment.   Remote monitoring is used to monitor your Pacemaker of ICD from home. This monitoring reduces  the number of office visits required to check your device to one time per year. It allows [place] to keep an eye on the functioning of your device to ensure it is working properly. You are scheduled for a device check from home on 08/15/2015. You may send your transmission at any time that day. If you have a wireless device, the transmission will be sent automatically. After your physician reviews your transmission, you will receive a postcard with your next transmission date.  Your physician recommends that you return for lab work CMP and TSH       Signed, Sanda Klein, MD  05/19/2015 10:14 AM    Sanda Klein, MD, [facility] [phone] office [phone]  pager

## 2015-06-01 LAB — CUP PACEART INCLINIC DEVICE CHECK
Battery Voltage: 2.99 V
Brady Statistic RV Percent Paced: 99 %
Date Time Interrogation Session: 20160818164348
MDC IDC MSMT BATTERY REMAINING PERCENTAGE: 95 % — AB
MDC IDC MSMT LEADCHNL RV IMPEDANCE VALUE: 450 Ohm
MDC IDC MSMT LEADCHNL RV PACING THRESHOLD AMPLITUDE: 0.625 V
MDC IDC MSMT LEADCHNL RV PACING THRESHOLD PULSEWIDTH: 0.5 ms
MDC IDC PG SERIAL: 7313697
MDC IDC SET LEADCHNL RV PACING AMPLITUDE: 0.875
MDC IDC SET LEADCHNL RV PACING PULSEWIDTH: 0.5 ms
MDC IDC SET LEADCHNL RV SENSING SENSITIVITY: 2.5 mV
Pulse Gen Model: 1210

## 2015-06-02 ENCOUNTER — Telehealth: Payer: Self-pay | Admitting: Physician Assistant

## 2015-06-02 NOTE — Telephone Encounter (Signed)
Please call this patient.  Reviewing records of patients attributed to [place] who have a diagnosis of CHF.  Dr. Joseph Art is listed as this patient's PCP, but has not seen the patient at least since our [facility] go-live 10/2011. He has been here for visits with other providers since then.  Please clarify the patient's PCP. If it is [facility], then update the care team in [place]: The research indicates that the long-acting version of the metoprolol he takes is better than the shorter acting. We would like to switch him from Lopressor (which he takes BID) to Toprol XL (which he would take only once a day). Is he agreeable to that?  If not [facility], then please update the care team in [facility] and suggest that the patient contact his PCP or cardiologist (Dr. Sallyanne Kuster) to discuss the change.

## 2015-06-12 NOTE — Telephone Encounter (Signed)
Spoke with pt, advised message. He would like to ask his cardiologist about this first.

## 2015-06-14 ENCOUNTER — Ambulatory Visit (INDEPENDENT_AMBULATORY_CARE_PROVIDER_SITE_OTHER): Payer: Medicare Other | Admitting: Pharmacist Clinician (PhC)/ Clinical Pharmacy Specialist

## 2015-06-14 DIAGNOSIS — I4891 Unspecified atrial fibrillation: Secondary | ICD-10-CM | POA: Diagnosis not present

## 2015-06-14 DIAGNOSIS — I4821 Permanent atrial fibrillation: Secondary | ICD-10-CM

## 2015-06-14 DIAGNOSIS — Z7901 Long term (current) use of anticoagulants: Secondary | ICD-10-CM

## 2015-06-14 DIAGNOSIS — I482 Chronic atrial fibrillation, unspecified: Secondary | ICD-10-CM

## 2015-06-14 LAB — POCT INR: INR: 2.8

## 2015-06-15 ENCOUNTER — Other Ambulatory Visit: Payer: Self-pay | Admitting: Physician Assistant

## 2015-06-27 ENCOUNTER — Encounter: Payer: Self-pay | Admitting: Cardiovascular Disease

## 2015-07-04 ENCOUNTER — Encounter: Payer: Self-pay | Admitting: Pharmacist

## 2015-07-11 ENCOUNTER — Other Ambulatory Visit: Payer: Self-pay | Admitting: Cardiovascular Disease

## 2015-07-11 NOTE — Telephone Encounter (Signed)
Rx request sent to pharmacy.  

## 2015-07-26 ENCOUNTER — Ambulatory Visit (INDEPENDENT_AMBULATORY_CARE_PROVIDER_SITE_OTHER): Payer: Medicare Other | Admitting: Pharmacist

## 2015-07-26 DIAGNOSIS — I4821 Permanent atrial fibrillation: Secondary | ICD-10-CM

## 2015-07-26 DIAGNOSIS — Z7901 Long term (current) use of anticoagulants: Secondary | ICD-10-CM | POA: Diagnosis not present

## 2015-07-26 DIAGNOSIS — I482 Chronic atrial fibrillation, unspecified: Secondary | ICD-10-CM

## 2015-07-26 DIAGNOSIS — I4891 Unspecified atrial fibrillation: Secondary | ICD-10-CM

## 2015-07-26 LAB — POCT INR: INR: 1.7

## 2015-08-21 ENCOUNTER — Ambulatory Visit (INDEPENDENT_AMBULATORY_CARE_PROVIDER_SITE_OTHER): Payer: Medicare Other | Admitting: *Deleted

## 2015-08-21 DIAGNOSIS — I442 Atrioventricular block, complete: Secondary | ICD-10-CM | POA: Diagnosis not present

## 2015-08-22 NOTE — Progress Notes (Signed)
Remote pacemaker transmission.   

## 2015-08-23 ENCOUNTER — Ambulatory Visit (INDEPENDENT_AMBULATORY_CARE_PROVIDER_SITE_OTHER): Payer: Medicare Other | Admitting: Pharmacist Clinician (PhC)/ Clinical Pharmacy Specialist

## 2015-08-23 ENCOUNTER — Encounter: Payer: Self-pay | Admitting: Cardiology

## 2015-08-23 DIAGNOSIS — Z7901 Long term (current) use of anticoagulants: Secondary | ICD-10-CM

## 2015-08-23 DIAGNOSIS — I4891 Unspecified atrial fibrillation: Secondary | ICD-10-CM | POA: Diagnosis not present

## 2015-08-23 DIAGNOSIS — I482 Chronic atrial fibrillation, unspecified: Secondary | ICD-10-CM

## 2015-08-23 DIAGNOSIS — I4821 Permanent atrial fibrillation: Secondary | ICD-10-CM

## 2015-08-23 LAB — CUP PACEART REMOTE DEVICE CHECK
Battery Remaining Longevity: 111 mo
Brady Statistic RV Percent Paced: 99 %
Implantable Lead Implant Date: 20130419
Implantable Lead Location: 753860
Lead Channel Pacing Threshold Amplitude: 0.75 V
Lead Channel Pacing Threshold Pulse Width: 0.5 ms
Lead Channel Setting Pacing Pulse Width: 0.5 ms
MDC IDC MSMT BATTERY REMAINING PERCENTAGE: 95.5 %
MDC IDC MSMT BATTERY VOLTAGE: 2.96 V
MDC IDC MSMT LEADCHNL RV IMPEDANCE VALUE: 430 Ohm
MDC IDC PG SERIAL: 7313697
MDC IDC SESS DTM: 20161107071248
MDC IDC SET LEADCHNL RV PACING AMPLITUDE: 1 V
MDC IDC SET LEADCHNL RV SENSING SENSITIVITY: 2.5 mV

## 2015-08-23 LAB — POCT INR: INR: 2.2

## 2015-09-20 ENCOUNTER — Ambulatory Visit (INDEPENDENT_AMBULATORY_CARE_PROVIDER_SITE_OTHER): Payer: Medicare Other | Admitting: Pharmacist Clinician (PhC)/ Clinical Pharmacy Specialist

## 2015-09-20 DIAGNOSIS — Z7901 Long term (current) use of anticoagulants: Secondary | ICD-10-CM | POA: Diagnosis not present

## 2015-09-20 DIAGNOSIS — I482 Chronic atrial fibrillation, unspecified: Secondary | ICD-10-CM

## 2015-09-20 DIAGNOSIS — I4891 Unspecified atrial fibrillation: Secondary | ICD-10-CM | POA: Diagnosis not present

## 2015-09-20 DIAGNOSIS — I4821 Permanent atrial fibrillation: Secondary | ICD-10-CM

## 2015-09-20 LAB — POCT INR: INR: 2.4

## 2015-10-18 ENCOUNTER — Ambulatory Visit (INDEPENDENT_AMBULATORY_CARE_PROVIDER_SITE_OTHER): Payer: PPO | Admitting: Pharmacist Clinician (PhC)/ Clinical Pharmacy Specialist

## 2015-10-18 DIAGNOSIS — Z7901 Long term (current) use of anticoagulants: Secondary | ICD-10-CM

## 2015-10-18 DIAGNOSIS — I4891 Unspecified atrial fibrillation: Secondary | ICD-10-CM | POA: Diagnosis not present

## 2015-10-18 DIAGNOSIS — I482 Chronic atrial fibrillation, unspecified: Secondary | ICD-10-CM

## 2015-10-18 DIAGNOSIS — I4821 Permanent atrial fibrillation: Secondary | ICD-10-CM

## 2015-10-18 LAB — POCT INR: INR: 2.7

## 2015-10-25 ENCOUNTER — Ambulatory Visit (INDEPENDENT_AMBULATORY_CARE_PROVIDER_SITE_OTHER): Payer: PPO | Admitting: Urgent Care

## 2015-10-25 ENCOUNTER — Ambulatory Visit (INDEPENDENT_AMBULATORY_CARE_PROVIDER_SITE_OTHER): Payer: PPO

## 2015-10-25 VITALS — BP 126/82 | HR 81 | Temp 98.3°F | Resp 18 | Ht 68.5 in | Wt 215.6 lb

## 2015-10-25 DIAGNOSIS — R05 Cough: Secondary | ICD-10-CM

## 2015-10-25 DIAGNOSIS — R0602 Shortness of breath: Secondary | ICD-10-CM | POA: Diagnosis not present

## 2015-10-25 DIAGNOSIS — J988 Other specified respiratory disorders: Secondary | ICD-10-CM | POA: Diagnosis not present

## 2015-10-25 DIAGNOSIS — Z8679 Personal history of other diseases of the circulatory system: Secondary | ICD-10-CM

## 2015-10-25 DIAGNOSIS — R059 Cough, unspecified: Secondary | ICD-10-CM

## 2015-10-25 DIAGNOSIS — R062 Wheezing: Secondary | ICD-10-CM | POA: Diagnosis not present

## 2015-10-25 DIAGNOSIS — J22 Unspecified acute lower respiratory infection: Secondary | ICD-10-CM

## 2015-10-25 LAB — BRAIN NATRIURETIC PEPTIDE: Brain Natriuretic Peptide: 411.2 pg/mL — ABNORMAL HIGH (ref 0.0–100.0)

## 2015-10-25 LAB — POCT CBC
GRANULOCYTE PERCENT: 69.3 % (ref 37–80)
HEMATOCRIT: 42.1 % — AB (ref 43.5–53.7)
HEMOGLOBIN: 15.1 g/dL (ref 14.1–18.1)
Lymph, poc: 1.1 (ref 0.6–3.4)
MCH: 33 pg — AB (ref 27–31.2)
MCHC: 35.8 g/dL — AB (ref 31.8–35.4)
MCV: 92.1 fL (ref 80–97)
MID (cbc): 0.7 (ref 0–0.9)
MPV: 7.1 fL (ref 0–99.8)
POC GRANULOCYTE: 4.1 (ref 2–6.9)
POC LYMPH PERCENT: 18.8 %L (ref 10–50)
POC MID %: 11.9 % (ref 0–12)
Platelet Count, POC: 161 10*3/uL (ref 142–424)
RBC: 4.57 M/uL — AB (ref 4.69–6.13)
RDW, POC: 12.8 %
WBC: 5.9 10*3/uL (ref 4.6–10.2)

## 2015-10-25 MED ORDER — AZITHROMYCIN 250 MG PO TABS: ORAL_TABLET | ORAL | Status: DC

## 2015-10-25 MED ORDER — HYDROCODONE-HOMATROPINE 5-1.5 MG/5ML PO SYRP: 5.0000 mL | ORAL_SOLUTION | Freq: Every evening | ORAL | Status: DC | PRN

## 2015-10-25 MED ORDER — ALBUTEROL SULFATE (2.5 MG/3ML) 0.083% IN NEBU
2.5000 mg | INHALATION_SOLUTION | Freq: Once | RESPIRATORY_TRACT | Status: AC
  Administered 2015-10-25: 2.5 mg via RESPIRATORY_TRACT

## 2015-10-25 MED ORDER — IPRATROPIUM BROMIDE 0.02 % IN SOLN
0.5000 mg | Freq: Once | RESPIRATORY_TRACT | Status: AC
  Administered 2015-10-25: 0.5 mg via RESPIRATORY_TRACT

## 2015-10-25 NOTE — Patient Instructions (Addendum)
-   Increase your Lasix dose to 2 pills daily. - Increase your potassium intake through food like coconut water, avocados.

## 2015-10-25 NOTE — Progress Notes (Signed)
MRN: [id number] DOB: [birth year]  Subjective:   Benjamin Mckay is a 70 y.o. male with pmh CHF, Afib, congenital heart block, pacemaker status presenting for chief complaint of URI  Reports 1 week history of occasionally productive cough, wheezing, sinus congestion, post-nasal drainage. Has tried otc NyQuil, Delsym with minimal relief. Denies fever, chest pain, shob, n/v, abdominal pain, sore throat, ear pain, ear drainage, sinus pain. Has had lower leg swelling, ~5lb increase in the past week, ~10lb in the past month. Denies pnd, feeling smothered, difficulty laying down, cough worse at night. He increased his Lasix to twice daily yesterday. His PT/INR level was 2.7, 10/18/2015. Plans on going on a cruise on 10/28/2015 for 2 weeks.  [place] has a current medication list which includes the following prescription(s): amiodarone, aspirin, furosemide, losartan, metoprolol, pravastatin, tamsulosin, and warfarin. Also is allergic to statins and penicillins.  Benjamin Mckay  has a past medical history of Atrial fibrillation ([place]); Lymphoma ([place]) (1998); Diverticulosis; Esophageal stricture; Hyperlipidemia; Hypertension; CVA (cerebral infarction); Colon polyps; Urinary frequency; Umbilical hernia; Congenital heart block; CHB (complete heart block) ([place]) (11/06/2013); Cardiomyopathy, ischemic (11/07/2013); and Chronic combined systolic and diastolic CHF (congestive heart failure) ([place]). Also  has past surgical history that includes Appendectomy [id number]); Colectomy; pacemaker placement; Tonsillectomy; neck fusion; Back surgery; CAROTID DOPPLER (11/04/2012); Pacemaker generator change (01/31/2012); Cardiac catheterization (01/06/2003); Cardiovascular stress test (07/16/2012); transthoracic echocardiogram (11/04/2012); and Permanent pacemaker generator change (N/A, 01/31/2012).  Objective:   Vitals: BP 126/82 mmHg  Pulse 81  Temp(Src) 98.3 F (36.8 C) (Oral)  Resp 18  Ht 5' 8.5" (1.74 m)  Wt 215 lb 9.6 oz (97.796 kg)  BMI  32.30 kg/m2  SpO2 97%  Wt Readings from Last 3 Encounters:  10/25/15 215 lb 9.6 oz (97.796 kg)  05/17/15 207 lb 14.4 oz (94.303 kg)  03/02/15 213 lb (96.616 kg)   Physical Exam  Constitutional: He is oriented to person, place, and time. He appears well-developed and well-nourished.  HENT:  TM's intact bilaterally, no effusions or erythema. Nasal turbinates pink and moist, nasal passages patent. No sinus tenderness. Oropharynx with slight post-nasal drainage, mucous membranes moist, dentition in good repair.  Eyes: Right eye exhibits no discharge. Left eye exhibits no discharge. No scleral icterus.  Neck: Normal range of motion. Neck supple. No JVD present.  Cardiovascular: Normal rate, regular rhythm and intact distal pulses.  Exam reveals no gallop and no friction rub.   No murmur heard. Pulmonary/Chest: No respiratory distress. He has wheezes (throughout). He has no rales.  Musculoskeletal: He exhibits edema (1+ pitting edema up to mid calf).  Lymphadenopathy:    He has no cervical adenopathy.  Neurological: He is alert and oriented to person, place, and time.  Skin: Skin is warm and dry. No rash noted. No erythema. No pallor.   [facility] reading (PRIMARY) by  Dr. Brigitte Pulse and PA-Latajah Thuman. Chest - patchy infiltrates throughout, pacemaker visible.  Dg Chest 2 View  10/25/2015  CLINICAL DATA:  Shortness of breath and cough EXAM: CHEST - 2 VIEW COMPARISON:  12/18/2014 FINDINGS: Cardiac shadow is stable. Pacing device is again seen. Mild central vascular congestion is noted without definitive interstitial edema. There is some mild increased density noted in the right upper lobe which may represent some very early infiltrate. No sizable effusion is seen. IMPRESSION: Mild vascular congestion. Changes suggestive of early right upper lobe infiltrate. Electronically Signed   By: Inez Catalina M.D.   On: 10/25/2015 10:14   Results for orders placed or performed in  visit on 10/25/15 (from the past 24  hour(s))  POCT CBC     Status: Abnormal   Collection Time: 10/25/15  9:03 AM  Result Value Ref Range   WBC 5.9 4.6 - 10.2 K/uL   Lymph, poc 1.1 0.6 - 3.4   POC LYMPH PERCENT 18.8 10 - 50 %L   MID (cbc) 0.7 0 - 0.9   POC MID % 11.9 0 - 12 %M   POC Granulocyte 4.1 2 - 6.9   Granulocyte percent 69.3 37 - 80 %G   RBC 4.57 (A) 4.69 - 6.13 M/uL   Hemoglobin 15.1 14.1 - 18.1 g/dL   HCT, POC 42.1 (A) 43.5 - 53.7 %   MCV 92.1 80 - 97 fL   MCH, POC 33.0 (A) 27 - 31.2 pg   MCHC 35.8 (A) 31.8 - 35.4 g/dL   RDW, POC 12.8 %   Platelet Count, POC 161 142 - 424 K/uL   MPV 7.1 0 - 99.8 fL   Assessment and Plan :   This case was precepted with Dr. [name].   1. Lower respiratory infection 2. Cough 3. Wheezing - Right lung wheezing improved, s/p nebulizer treatment. Left lung sounds still had wheezing but no rales. Start Azithromycin for the next 5 days. Hycodan for cough suppression. - F/u in 2 days since patient is planning on going on a 2 week cruise.  4. History of CHF (congestive heart failure) - Recommended patient take Lasix twice daily. Patient will recheck with me on Friday 10/27/2015. However, I do feel reassured that this is less likely to be CHF, labs pending.  Jaynee Eagles, PA-C [facility] [phone] 10/25/2015 8:34 AM

## 2015-10-27 ENCOUNTER — Ambulatory Visit (INDEPENDENT_AMBULATORY_CARE_PROVIDER_SITE_OTHER): Payer: PPO | Admitting: Urgent Care

## 2015-10-27 ENCOUNTER — Ambulatory Visit (INDEPENDENT_AMBULATORY_CARE_PROVIDER_SITE_OTHER): Payer: PPO

## 2015-10-27 VITALS — BP 120/64 | HR 65 | Temp 98.2°F | Resp 16 | Ht 68.0 in | Wt 218.0 lb

## 2015-10-27 DIAGNOSIS — R0989 Other specified symptoms and signs involving the circulatory and respiratory systems: Secondary | ICD-10-CM | POA: Diagnosis not present

## 2015-10-27 DIAGNOSIS — I5040 Unspecified combined systolic (congestive) and diastolic (congestive) heart failure: Secondary | ICD-10-CM | POA: Diagnosis not present

## 2015-10-27 DIAGNOSIS — R059 Cough, unspecified: Secondary | ICD-10-CM

## 2015-10-27 DIAGNOSIS — R0602 Shortness of breath: Secondary | ICD-10-CM

## 2015-10-27 DIAGNOSIS — Z5181 Encounter for therapeutic drug level monitoring: Secondary | ICD-10-CM | POA: Diagnosis not present

## 2015-10-27 DIAGNOSIS — R05 Cough: Secondary | ICD-10-CM | POA: Diagnosis not present

## 2015-10-27 LAB — BASIC METABOLIC PANEL
BUN: 25 mg/dL (ref 7–25)
CALCIUM: 8.9 mg/dL (ref 8.6–10.3)
CO2: 29 mmol/L (ref 20–31)
Chloride: 102 mmol/L (ref 98–110)
Creat: 1.03 mg/dL (ref 0.70–1.25)
Glucose, Bld: 81 mg/dL (ref 65–99)
Potassium: 3.9 mmol/L (ref 3.5–5.3)
SODIUM: 139 mmol/L (ref 135–146)

## 2015-10-27 MED ORDER — POTASSIUM CHLORIDE CRYS ER 20 MEQ PO TBCR: 20.0000 meq | EXTENDED_RELEASE_TABLET | Freq: Every day | ORAL | Status: DC

## 2015-10-27 NOTE — Patient Instructions (Signed)

## 2015-10-27 NOTE — Progress Notes (Addendum)
MRN: [id number] DOB: April 20, 1946  Subjective:   Benjamin Mckay is a 70 y.o. male presenting for follow up on CAP, CHF. Patient was initially seen on 10/25/2015, started on Azithromycin for pneumonia. Patient also has a history of combined systolic and diastolic heart failure with his last echo showing a decline in his EF to 35%. He was taking Lasix 20mg  every other day. He does have a cardiologist, Dr. Sallyanne Kuster but is opposed to trying to have an urgent visit with him given his travels plans to go on a cruise for 2 weeks tomorrow. However, he does have an appointment for 11/21/2015 with Dr. Sallyanne Kuster. At last visit I advised patient to start 40mg  Lasix daily since he is unwilling to see his cardiologist. Today, he reports significant improvement in his cough, chest tightness. He has lost 2 pounds in the past 2 days by his home scale. He denies chest pain, dyspnea, shob, orthopnea. He admits minimal lower leg swelling that is unchanged from his last visit with me. He also is sleeping on two pillows which is unchanged for the past year.   Benjamin Mckay has a current medication list which includes the following prescription(s): amiodarone, aspirin, azithromycin, furosemide, hydrocodone-homatropine, losartan, metoprolol, pravastatin, tamsulosin, and warfarin. Also is allergic to statins and penicillins.  Benjamin Mckay  has a past medical history of Atrial fibrillation ([employer]); Lymphoma ([place]) (1998); Diverticulosis; Esophageal stricture; Hyperlipidemia; Hypertension; CVA (cerebral infarction); Colon polyps; Urinary frequency; Umbilical hernia; Congenital heart block; CHB (complete heart block) ([place]) (11/06/2013); Cardiomyopathy, ischemic (11/07/2013); and Chronic combined systolic and diastolic CHF (congestive heart failure) ([place]). Also  has past surgical history that includes Appendectomy [id number]); Colectomy; pacemaker placement; Tonsillectomy; neck fusion; Back surgery; CAROTID DOPPLER (11/04/2012); Pacemaker generator change  (01/31/2012); Cardiac catheterization (01/06/2003); Cardiovascular stress test (07/16/2012); transthoracic echocardiogram (11/04/2012); and Permanent pacemaker generator change (N/A, 01/31/2012).  Objective:   Vitals: BP 120/64 mmHg  Pulse 65  Temp(Src) 98.2 F (36.8 C)  Resp 16  Ht 5\' 8"  (1.727 m)  Wt 218 lb (98.884 kg)  BMI 33.15 kg/m2  SpO2 96%  Wt Readings from Last 3 Encounters:  10/27/15 218 lb (98.884 kg)  10/25/15 215 lb 9.6 oz (97.796 kg)  05/17/15 207 lb 14.4 oz (94.303 kg)   Physical Exam  Constitutional: He is oriented to person, place, and time. He appears well-developed and well-nourished.  HENT:  Mouth/Throat: Oropharynx is clear and moist.  Eyes: No scleral icterus.  Neck: Normal range of motion. Neck supple. No JVD present.  Cardiovascular: Normal rate, regular rhythm and intact distal pulses.  Exam reveals no gallop and no friction rub.   No murmur heard. Pulmonary/Chest: No respiratory distress. He has wheezes (left-sided on forced expiration). He has no rales.  Musculoskeletal: He exhibits edema (trace edema up to mid calf bilaterally).  Neurological: He is alert and oriented to person, place, and time.  Skin: Skin is warm and dry.   Results for orders placed or performed in visit on 10/27/15 (from the past 72 hour(s))  Basic metabolic panel     Status: None   Collection Time: 10/27/15 10:11 AM  Result Value Ref Range   Sodium 139 135 - 146 mmol/L   Potassium 3.9 3.5 - 5.3 mmol/L   Chloride 102 98 - 110 mmol/L   CO2 29 20 - 31 mmol/L   Glucose, Bld 81 65 - 99 mg/dL   BUN 25 7 - 25 mg/dL   Creat 1.03 0.70 - 1.25 mg/dL   Calcium 8.9 8.6 -  10.3 mg/dL  Brain natriuretic peptide     Status: Abnormal   Collection Time: 10/27/15 10:12 AM  Result Value Ref Range   Brain Natriuretic Peptide 394.1 (H) 0.0 - 100.0 pg/mL   [facility] reading (PRIMARY) by  Dr. Lorelei Pont. CXR:  RUL infiltrate is improved slightly.  Vascular congestion also appears to be somewhat less  Dg  Chest 2 View  10/27/2015  CLINICAL DATA:  Congestive heart failure.  Cough. EXAM: CHEST  2 VIEW COMPARISON:  10/25/2015 FINDINGS: Dual lead pacer noted. There is an abandoned pacer lead extending to the right atrium which is discontinuous superiorly and does not appear attached to the pulse generator. Mild enlargement of the cardiopericardial silhouette noted without edema. Persistent vague density in the right upper lobe is nonspecific but could represent some resolving edema or faint pneumonia. IMPRESSION: 1. Stable appearance of indistinct density in the right upper lobe, possibly from low-level edema or pneumonia. Follow up to clearance is recommended -consider radiography in 4 weeks time. 2. Mild enlargement of the cardiopericardial silhouette. Electronically Signed   By: Van Clines M.D.   On: 10/27/2015 10:25   Assessment and Plan :   This case was precepted with Dr. Lorelei Pont.   1. Combined systolic and diastolic congestive heart failure, unspecified congestive heart failure chronicity ([place]) 2. Cough 3. Chest congestion 4. SOB (shortness of breath) 5. Medication monitoring encounter - Patient mildly improved from his initial visit. Counseled extensively with Dr. Lillie Fragmin help regarding risks of traveling and CHF. Patient acknowledged risks. He will continue to take Lasix 40mg  daily for one week, finish Azithromycin for pneumonia. He will monitor his weights while away. Upon his return he will present to his cardiologist visit. Labs are pending and will be reported to Dr. Lorelei Pont.  Jaynee Eagles, PA-C [facility] [phone] 10/27/2015 9:05 AM   Wt Readings from Last 3 Encounters:  10/27/15 218 lb (98.884 kg)  10/25/15 215 lb 9.6 oz (97.796 kg)  05/17/15 207 lb 14.4 oz (94.303 kg)   addnd by J Copland Last echo a year ago, 30-35% EF. He was last in the hospital in March of 2016.  His discharge weight was 223.  However he has lost weight  since then and considers approx 210 to be his dry weight.  Although he is up by our scale today he reports he is down 2 lbs by his home scale. (Per verbal report, staff subtracted 4lbs of weight on 1/11 for clothes and shoes) He had been on 20mg  lasix every other day- now taking 40/day the last 3 days  We will plan to have him stay on lasix 40/d until he is back at his dry weight Encouraged him to really watch his diet on his upcoming trip- salt will be hard to avoid.  Also take home scale with him so he can monitor his weight and "check in" with ship's medical officer when he boards.  Continue to take lasix 40/d until back to his dry weight.  He is currently NOT on any potassium. Will also rx potassium to have on hand- will check K level today and advise him on this further   Called on 1/14; renal function is ok.  Will have him take Kdur 20 every other day as long as he is on the increased dose of lasix.  He understands and will closely monitor himself on his upcoming trip.  Will ask him to follow-up for labs and with cardiology when he  returns home.  See letter to pt  Results for orders placed or performed in visit on 0000000  Basic metabolic panel  Result Value Ref Range   Sodium 139 135 - 146 mmol/L   Potassium 3.9 3.5 - 5.3 mmol/L   Chloride 102 98 - 110 mmol/L   CO2 29 20 - 31 mmol/L   Glucose, Bld 81 65 - 99 mg/dL   BUN 25 7 - 25 mg/dL   Creat 1.03 0.70 - 1.25 mg/dL   Calcium 8.9 8.6 - 10.3 mg/dL  Brain natriuretic peptide  Result Value Ref Range   Brain Natriuretic Peptide 394.1 (H) 0.0 - 100.0 pg/mL

## 2015-10-28 LAB — BRAIN NATRIURETIC PEPTIDE: Brain Natriuretic Peptide: 394.1 pg/mL — ABNORMAL HIGH (ref 0.0–100.0)

## 2015-10-31 NOTE — Addendum Note (Signed)
Addended by: Lamar Blinks C on: 10/31/2015 06:34 AM   Modules accepted: Orders

## 2015-11-13 ENCOUNTER — Encounter: Payer: Self-pay | Admitting: Physician Assistant

## 2015-11-13 ENCOUNTER — Inpatient Hospital Stay (HOSPITAL_COMMUNITY)
Admission: AD | Admit: 2015-11-13 | Discharge: 2015-11-24 | DRG: 292 | Disposition: A | Discharge status: Home / Self Care | Payer: PPO | Source: Ambulatory Visit | Attending: Cardiovascular Disease | Admitting: Cardiovascular Disease

## 2015-11-13 ENCOUNTER — Ambulatory Visit (INDEPENDENT_AMBULATORY_CARE_PROVIDER_SITE_OTHER): Payer: PPO | Admitting: Physician Assistant

## 2015-11-13 ENCOUNTER — Ambulatory Visit (INDEPENDENT_AMBULATORY_CARE_PROVIDER_SITE_OTHER): Payer: PPO | Admitting: Family Medicine

## 2015-11-13 ENCOUNTER — Ambulatory Visit (INDEPENDENT_AMBULATORY_CARE_PROVIDER_SITE_OTHER): Payer: PPO

## 2015-11-13 VITALS — BP 154/86 | HR 80 | Temp 98.0°F | Resp 17 | Ht 68.5 in | Wt 237.0 lb

## 2015-11-13 VITALS — BP 110/64 | HR 64 | Ht 68.5 in | Wt 238.0 lb

## 2015-11-13 DIAGNOSIS — I255 Ischemic cardiomyopathy: Secondary | ICD-10-CM

## 2015-11-13 DIAGNOSIS — I10 Essential (primary) hypertension: Secondary | ICD-10-CM | POA: Diagnosis present

## 2015-11-13 DIAGNOSIS — I110 Hypertensive heart disease with heart failure: Principal | ICD-10-CM | POA: Diagnosis present

## 2015-11-13 DIAGNOSIS — I442 Atrioventricular block, complete: Secondary | ICD-10-CM | POA: Diagnosis not present

## 2015-11-13 DIAGNOSIS — E0580 Other thyrotoxicosis without thyrotoxic crisis or storm: Secondary | ICD-10-CM

## 2015-11-13 DIAGNOSIS — Z950 Presence of cardiac pacemaker: Secondary | ICD-10-CM | POA: Diagnosis not present

## 2015-11-13 DIAGNOSIS — I472 Ventricular tachycardia: Secondary | ICD-10-CM | POA: Diagnosis not present

## 2015-11-13 DIAGNOSIS — I5023 Acute on chronic systolic (congestive) heart failure: Secondary | ICD-10-CM | POA: Diagnosis not present

## 2015-11-13 DIAGNOSIS — E0590 Thyrotoxicosis, unspecified without thyrotoxic crisis or storm: Secondary | ICD-10-CM | POA: Diagnosis not present

## 2015-11-13 DIAGNOSIS — R0982 Postnasal drip: Secondary | ICD-10-CM | POA: Diagnosis not present

## 2015-11-13 DIAGNOSIS — R0602 Shortness of breath: Secondary | ICD-10-CM | POA: Diagnosis not present

## 2015-11-13 DIAGNOSIS — Z7901 Long term (current) use of anticoagulants: Secondary | ICD-10-CM | POA: Diagnosis not present

## 2015-11-13 DIAGNOSIS — C8599 Non-Hodgkin lymphoma, unspecified, extranodal and solid organ sites: Secondary | ICD-10-CM | POA: Diagnosis not present

## 2015-11-13 DIAGNOSIS — I482 Chronic atrial fibrillation, unspecified: Secondary | ICD-10-CM

## 2015-11-13 DIAGNOSIS — E785 Hyperlipidemia, unspecified: Secondary | ICD-10-CM

## 2015-11-13 DIAGNOSIS — Z981 Arthrodesis status: Secondary | ICD-10-CM | POA: Diagnosis not present

## 2015-11-13 DIAGNOSIS — I509 Heart failure, unspecified: Secondary | ICD-10-CM | POA: Diagnosis not present

## 2015-11-13 DIAGNOSIS — R05 Cough: Secondary | ICD-10-CM | POA: Diagnosis not present

## 2015-11-13 DIAGNOSIS — I5022 Chronic systolic (congestive) heart failure: Secondary | ICD-10-CM

## 2015-11-13 DIAGNOSIS — Z7982 Long term (current) use of aspirin: Secondary | ICD-10-CM | POA: Diagnosis not present

## 2015-11-13 DIAGNOSIS — I2583 Coronary atherosclerosis due to lipid rich plaque: Secondary | ICD-10-CM

## 2015-11-13 DIAGNOSIS — T462X5A Adverse effect of other antidysrhythmic drugs, initial encounter: Secondary | ICD-10-CM | POA: Diagnosis not present

## 2015-11-13 DIAGNOSIS — R059 Cough, unspecified: Secondary | ICD-10-CM

## 2015-11-13 DIAGNOSIS — I2510 Atherosclerotic heart disease of native coronary artery without angina pectoris: Secondary | ICD-10-CM

## 2015-11-13 DIAGNOSIS — R635 Abnormal weight gain: Secondary | ICD-10-CM

## 2015-11-13 DIAGNOSIS — Z8673 Personal history of transient ischemic attack (TIA), and cerebral infarction without residual deficits: Secondary | ICD-10-CM

## 2015-11-13 DIAGNOSIS — Z9111 Patient's noncompliance with dietary regimen: Secondary | ICD-10-CM | POA: Diagnosis not present

## 2015-11-13 DIAGNOSIS — Z79899 Other long term (current) drug therapy: Secondary | ICD-10-CM | POA: Diagnosis not present

## 2015-11-13 DIAGNOSIS — I25119 Atherosclerotic heart disease of native coronary artery with unspecified angina pectoris: Secondary | ICD-10-CM | POA: Diagnosis present

## 2015-11-13 DIAGNOSIS — R058 Other specified cough: Secondary | ICD-10-CM

## 2015-11-13 HISTORY — DX: Thyrotoxicosis, unspecified without thyrotoxic crisis or storm: E05.90

## 2015-11-13 LAB — COMPREHENSIVE METABOLIC PANEL
ALT: 61 U/L (ref 17–63)
AST: 48 U/L — ABNORMAL HIGH (ref 15–41)
Albumin: 2.7 g/dL — ABNORMAL LOW (ref 3.5–5.0)
Alkaline Phosphatase: 95 U/L (ref 38–126)
Anion gap: 10 (ref 5–15)
BUN: 21 mg/dL — ABNORMAL HIGH (ref 6–20)
CHLORIDE: 104 mmol/L (ref 101–111)
CO2: 28 mmol/L (ref 22–32)
CREATININE: 1.08 mg/dL (ref 0.61–1.24)
Calcium: 8.9 mg/dL (ref 8.9–10.3)
GFR calc non Af Amer: >60 mL/min (ref >60)
Glucose, Bld: 98 mg/dL (ref 65–99)
POTASSIUM: 4 mmol/L (ref 3.5–5.1)
SODIUM: 142 mmol/L (ref 135–145)
Total Bilirubin: 1.8 mg/dL — ABNORMAL HIGH (ref 0.3–1.2)
Total Protein: 5.6 g/dL — ABNORMAL LOW (ref 6.5–8.1)

## 2015-11-13 LAB — CBC WITH DIFFERENTIAL/PLATELET
BASOS ABS: 0 10*3/uL (ref 0.0–0.1)
Basophils Relative: 0 %
EOS ABS: 0.3 10*3/uL (ref 0.0–0.7)
EOS PCT: 6 %
HCT: 38.1 % — ABNORMAL LOW (ref 39.0–52.0)
Hemoglobin: 13.2 g/dL (ref 13.0–17.0)
Lymphocytes Relative: 23 %
Lymphs Abs: 1.1 10*3/uL (ref 0.7–4.0)
MCH: 32.8 pg (ref 26.0–34.0)
MCHC: 34.6 g/dL (ref 30.0–36.0)
MCV: 94.8 fL (ref 78.0–100.0)
MONO ABS: 0.8 10*3/uL (ref 0.1–1.0)
Monocytes Relative: 16 %
Neutro Abs: 2.7 10*3/uL (ref 1.7–7.7)
Neutrophils Relative %: 55 %
PLATELETS: 149 10*3/uL — AB (ref 150–400)
RBC: 4.02 MIL/uL — AB (ref 4.22–5.81)
RDW: 13.4 % (ref 11.5–15.5)
WBC: 5 10*3/uL (ref 4.0–10.5)

## 2015-11-13 LAB — TSH: TSH: 0.034 u[IU]/mL — AB (ref 0.350–4.500)

## 2015-11-13 LAB — BRAIN NATRIURETIC PEPTIDE: B NATRIURETIC PEPTIDE 5: 552.5 pg/mL — AB (ref 0.0–100.0)

## 2015-11-13 LAB — PROTIME-INR
INR: 3.45 — AB (ref 0.00–1.49)
PROTHROMBIN TIME: 34 s — AB (ref 11.6–15.2)

## 2015-11-13 MED ORDER — SODIUM CHLORIDE 0.9 % IV SOLN
250.0000 mL | INTRAVENOUS | Status: DC | PRN
  Administered 2015-11-15: 250 mL via INTRAVENOUS

## 2015-11-13 MED ORDER — POTASSIUM CHLORIDE CRYS ER 20 MEQ PO TBCR
20.0000 meq | EXTENDED_RELEASE_TABLET | Freq: Two times a day (BID) | ORAL | Status: DC
  Administered 2015-11-13 – 2015-11-24 (×22): 20 meq via ORAL
  Filled 2015-11-13 (×22): qty 1

## 2015-11-13 MED ORDER — ONDANSETRON HCL 4 MG/2ML IJ SOLN: 4.0000 mg | Freq: Four times a day (QID) | INTRAMUSCULAR | Status: DC | PRN

## 2015-11-13 MED ORDER — ACETAMINOPHEN 325 MG PO TABS
650.0000 mg | ORAL_TABLET | ORAL | Status: DC | PRN
  Administered 2015-11-22: 650 mg via ORAL
  Filled 2015-11-13: qty 2

## 2015-11-13 MED ORDER — SODIUM CHLORIDE 0.9% FLUSH
3.0000 mL | Freq: Two times a day (BID) | INTRAVENOUS | Status: DC
  Administered 2015-11-13 – 2015-11-23 (×16): 3 mL via INTRAVENOUS

## 2015-11-13 MED ORDER — PRAVASTATIN SODIUM 40 MG PO TABS
80.0000 mg | ORAL_TABLET | Freq: Every day | ORAL | Status: DC
Start: 2015-11-13 — End: 2015-11-24
  Administered 2015-11-13 – 2015-11-23 (×11): 80 mg via ORAL
  Filled 2015-11-13 (×12): qty 2

## 2015-11-13 MED ORDER — METOPROLOL SUCCINATE ER 25 MG PO TB24
25.0000 mg | ORAL_TABLET | Freq: Every day | ORAL | Status: DC
  Administered 2015-11-13 – 2015-11-24 (×12): 25 mg via ORAL
  Filled 2015-11-13 (×12): qty 1

## 2015-11-13 MED ORDER — TAMSULOSIN HCL 0.4 MG PO CAPS
0.4000 mg | ORAL_CAPSULE | Freq: Every day | ORAL | Status: DC
  Administered 2015-11-13 – 2015-11-23 (×11): 0.4 mg via ORAL
  Filled 2015-11-13 (×11): qty 1

## 2015-11-13 MED ORDER — WARFARIN - PHARMACIST DOSING INPATIENT
Freq: Every day | Status: DC
  Administered 2015-11-18 – 2015-11-22 (×4)

## 2015-11-13 MED ORDER — ASPIRIN EC 81 MG PO TBEC
81.0000 mg | DELAYED_RELEASE_TABLET | Freq: Every day | ORAL | Status: DC
  Administered 2015-11-14 – 2015-11-23 (×10): 81 mg via ORAL
  Filled 2015-11-13 (×10): qty 1

## 2015-11-13 MED ORDER — LOSARTAN POTASSIUM 25 MG PO TABS
25.0000 mg | ORAL_TABLET | Freq: Every day | ORAL | Status: DC
  Administered 2015-11-14 – 2015-11-18 (×5): 25 mg via ORAL
  Filled 2015-11-13 (×6): qty 1

## 2015-11-13 MED ORDER — FUROSEMIDE 10 MG/ML IJ SOLN
40.0000 mg | Freq: Two times a day (BID) | INTRAMUSCULAR | Status: DC
  Administered 2015-11-13 – 2015-11-14 (×2): 40 mg via INTRAVENOUS
  Filled 2015-11-13 (×2): qty 4

## 2015-11-13 MED ORDER — AMIODARONE HCL 200 MG PO TABS
200.0000 mg | ORAL_TABLET | Freq: Every day | ORAL | Status: DC
  Administered 2015-11-14 – 2015-11-15 (×2): 200 mg via ORAL
  Filled 2015-11-13 (×2): qty 1

## 2015-11-13 MED ORDER — SODIUM CHLORIDE 0.9% FLUSH: 3.0000 mL | INTRAVENOUS | Status: DC | PRN

## 2015-11-13 NOTE — H&P (Signed)
Benjamin Mckay is an 70 y.o. male.   Chief Complaint:  Severe LE edema, SOB   Patient ID: Benjamin Mckay, male   DOB: 1945-11-11, 70 y.o.   MRN: [id number]    Date:  11/13/2015   ID:  Benjamin Mckay, DOB 04/17/1946, MRN [id number]  PCP:  Robyn Haber, MD  Primary Cardiologist:  [facility]  Chief Complaint  Patient presents with  . Follow-up    no chest pain, alot shortness of breath, alot swelling, no cramping, no dizziness or lightheadedness     History of Present Illness: Benjamin Mckay is a 70 y.o. male  who presents for follow-up for ischemic cardiomyopathy with congestive heart failure, permanent atrial fibrillation, complete heart block and a history of nonsustained VT.  He was evaluated by Dr. Cristopher Peru for possible upgrade of his pacemaker to a biventricular ICD system. After discussing the risk/benefit balance of this procedure decision was made to proceed conservatively. He has had multiple previous pacemaker procedures (5 procedures on the left side) and is at high risk of infection, mechanical complications and even death with intervention, especially since he is pacemaker dependent. The decision was made to proceed conservatively. He does have complaints of fatigue and poor stamina, although he denies dyspnea. He was able to play golf earlier today. He states that leg weakness and pain seems to be a bigger limitation to physical activity than shortness of breath.  Patient has noticed worsening lower extremity edema over the last 3 weeks. He started by coughing and was treated for pneumonia with a Z-Pak. After the first week he went on a cruise for 2 weeks and his weight is increased by another 20 pounds in the 2 week.. Last August his weight was 208 pounds today is 238. He did increase his Lasix to 20 mg twice daily while he was on the cruise.  Reports the swelling is to his thighs and scrotum. He's noticed an increase in shortness of breath and more coughing today. He says  his been trying to watch his sodium intake however, I suspect all the food on the cruise was loaded with salt.  Surprisingly he denies orthopnea.  The patient currently denies nausea, vomiting, fever, chest pain, dizziness, PND, cough, congestion, abdominal pain, hematochezia, melena, claudication.  Wt Readings from Last 3 Encounters:  11/13/15 238 lb (107.956 kg)  11/13/15 237 lb (107.502 kg)  10/27/15 218 lb (98.884 kg)    Medications: Medication Sig  amiodarone (PACERONE) 200 MG tablet TAKE 1 TABLET (200 MG TOTAL) BY MOUTH DAILY.  aspirin 81 MG tablet Take 81 mg by mouth daily.    furosemide (LASIX) 20 MG tablet Take 1 tablet (20 mg total) by mouth daily. Take an extra tablet daily PRN for weight gain > 3 lbs in a day or 5 lbs in a week.  losartan (COZAAR) 25 MG tablet Take 1 tablet (25 mg total) by mouth daily.  metoprolol (LOPRESSOR) 50 MG tablet Take 1 tablet (50 mg total) by mouth 2 (two) times daily.  pravastatin (PRAVACHOL) 80 MG tablet Take 1 tablet (80 mg total) by mouth daily.  Tamsulosin HCl (FLOMAX) 0.4 MG CAPS Take 0.4 mg by mouth daily.   warfarin (COUMADIN) 2.5 MG tablet Take 1 tablet by mouth daily or as directed by coumadin clinic  potassium chloride SA (K-DUR,KLOR-CON) 20 MEQ tablet Take 1 tablet (20 mEq total) by mouth daily. Use as directed Patient not taking: Reported on 11/13/2015     Past Medical History  Diagnosis  Date  . Atrial fibrillation ([place])   . Lymphoma ([place]) 1998    of colon   . Diverticulosis   . Esophageal stricture   . Hyperlipidemia   . Hypertension   . CVA (cerebral infarction)   . Colon polyps   . Urinary frequency   . Umbilical hernia   . Congenital heart block   . CHB (complete heart block) ([place]) 11/06/2013  . Cardiomyopathy, ischemic 11/07/2013  . Chronic combined systolic and diastolic CHF (congestive heart failure) [facility])     Past Surgical History  Procedure Laterality Date  . Appendectomy  1953  . Colectomy      for lymphoma  .  Pacemaker placement      replaced 3 x  . Tonsillectomy    . Neck fusion    . Back surgery    . Carotid doppler  11/04/2012    Proximal Rt ICA 50-99% diameter reduction; Lft Bulb demonstrated mild amount homogeneous plaque-not hemodynamically significant; Lft ICA-normal patency.  . Pacemaker generator change  01/31/2012    [employer] DR RF model [id number] serial [phone]  . Cardiac catheterization  01/06/2003    Recommend medical therapy  . Cardiovascular stress test  07/16/2012    Mild-moderate perfusion defect seen in Basal inferior, Mid inferior, and Apica lateral consistent with infarct/scar. No scintigraphic evidence for inducible myocardial ischemia. No ECG changes. EKG negative for ischemia.  . Transthoracic echocardiogram  11/04/2012    EF [id number], systolic function moderately reduced, mild regurg of the aortic and mitral valves.  . Permanent pacemaker generator change N/A 01/31/2012    Procedure: PERMANENT PACEMAKER GENERATOR CHANGE;  Surgeon: Sanda Klein, MD;  Location: [facility];     Family History  Problem Relation Age of Onset  . Prostate cancer Father   . Colon cancer Neg Hx   . Diabetes Maternal Grandmother   . Heart disease Mother   . Stroke Mother   . Heart attack Neg Hx    Social History:  reports that he has never smoked. He does not have any smokeless tobacco history on file. He reports that he does not drink alcohol or use illicit drugs.  Allergies:  Allergies  Allergen Reactions  . Statins     Leg cramps.   Tolerates pravastatin.    Marland Kitchen Penicillins Rash     (Not in a hospital admission)  No results found for this or any previous visit (from the past 48 hour(s)). Dg Chest 2 View  11/13/2015  CLINICAL DATA:  Persistent cough since 10/27/2015 EXAM: CHEST  2 VIEW COMPARISON:  Chest x-ray 10/27/2015. FINDINGS: The heart is mildly enlarged but stable. The pacer wires are stable. Bibasilar opacity suspicious for pneumonia with probable small bilateral pleural  effusions. The bony thorax is intact. IMPRESSION: Bibasilar infiltrates. Electronically Signed   By: Marijo Sanes M.D.   On: 11/13/2015 11:59    Review of Systems  Constitutional: Negative for fever and diaphoresis.  Respiratory: Positive for cough and shortness of breath.   Cardiovascular: Positive for leg swelling. Negative for chest pain, orthopnea and PND.  Gastrointestinal: Negative for nausea, vomiting, abdominal pain, blood in stool and melena.  Genitourinary:       Severe scrotal swelling  Musculoskeletal: Negative for myalgias.  Neurological: Negative for dizziness and weakness.  All other systems reviewed and are negative.   Blood pressure 110/64, pulse 64, height 5' 8.5" (1.74 m), weight 238 lb (107.956 kg). Physical Exam  Nursing note and vitals reviewed. Constitutional: He  is oriented to person, place, and time. He appears well-developed and well-nourished. No distress.  HENT:  Head: Normocephalic and atraumatic.  Eyes: EOM are normal. Pupils are equal, round, and reactive to light.  Neck: Normal range of motion. Neck supple. No JVD present.  Cardiovascular: Normal rate, regular rhythm, S1 normal and S2 normal.   No murmur heard. Pulses:      Radial pulses are 2+ on the right side, and 2+ on the left side.       Dorsalis pedis pulses are 2+ on the right side, and 2+ on the left side.  Respiratory: Effort normal. He has rales.  GI: Soft. Bowel sounds are normal. He exhibits no distension. There is no tenderness.  Musculoskeletal: He exhibits edema.  Severe 3+ pitting edema up to his buttocks and scrotum  Lymphadenopathy:    He has no cervical adenopathy.  Neurological: He is alert and oriented to person, place, and time. He exhibits normal muscle tone.  Skin: Skin is warm and dry.  His mild superficial wound on his right lower extremity which he had scratched it on something. Now it is weeping fluid  Psychiatric: He has a normal mood and affect.      Assessment/Plan Acute on chronic systolic heart failure Hyperlipidemia Chronic atrial fibrillation Chronic Coumadin Essential hypertension Permanent pacemaker Ischemic cardiomyopathy  Mr. Campau is approximately 30 pounds volume overloaded with edema up to his thighs and scrotum. He'll be admitted for IV diuresis which will take several days.  We'll start with IV Lasix 40 mg twice daily with supplemental potassium.  Admission will check a CBC, CMP, BNP.  He probably had a chest x-ray earlier today which showed CHF.   Redo 2-D echocardiogram.  Tarri Fuller, PAC 11/13/2015, 3:19 PM   I have seen and examined the patient along with Tarri Fuller, PA.  I have reviewed the chart, notes and new data.  I agree with PA/NP's note.  Key new complaints: marked and uncomfortable edema of the lower extremities, less severe left heart failure symptoms (exertional dyspnea class 2-3, cough) Key examination changes: edema to groin/scrotum, weeping from right leg superficial laceration Key new findings / data: labs pending  PLAN: Hospitalize for IV diuretics, electrolyte monitoring. Reconsider CRT-P?   Sanda Klein, MD, [place] [phone] 11/13/2015, 4:57 PM

## 2015-11-13 NOTE — Progress Notes (Signed)
Patient arrived to unit from MD office with wife. Patient oriented to unit and nurse call system. Patient resting comfortably in bed. Continue with plan of care.

## 2015-11-13 NOTE — Progress Notes (Signed)
[facility] [address] [place] [address] [phone]  Date:  11/13/2015   Name:  Benjamin Mckay   DOB:  01/12/1946   MRN:  [id number]  PCP:  Benjamin Haber, MD    Chief Complaint: Cough and swelling in legs   History of Present Illness:  Benjamin Mckay is a 70 y.o. very pleasant male patient who presents with the following:  Known history of CHF, a fib, heart block, pacemaker. We last saw him prior to a planned trip- a 2 week cruise.  See OV from 1/11 and 1/13.   He was treated for pneumonia and CHF exacerbation. We had increased his lasix to 40/ day and added some K supplementation prior to his trip.  There was concern that he would get worse on his trip but understandably he was determined to go  He made it back from his trip but he notes that he not feeling well overall.  He notes much worse LE edema, now all the way into his thighs He got home about 36 hours ago  Wt Readings from Last 3 Encounters:  11/13/15 237 lb (107.502 kg)  10/27/15 218 lb (98.884 kg)  10/25/15 215 lb 9.6 oz (97.796 kg)   He is not breathing as well as he normally does  No CP He does have SOB. No orthopnea noted however No fever noted.   Besides his CHF he is well today   Patient Active Problem List   Diagnosis Date Noted  . History of sustained ventricular tachycardia 01/30/2015  . CHF (congestive heart failure) ([place]) 12/18/2014  . Chronic systolic CHF (congestive heart failure) ([place]) 12/18/2014  . Warfarin anticoagulation 12/08/2014  . Ataxia 12/08/2014  . Dizziness 12/08/2014  . On amiodarone therapy 12/08/2014  . ARF (acute renal failure) ([place]) 12/07/2014  . Acute renal failure syndrome ([place])   . Dehydration   . Cardiomyopathy, ischemic 11/07/2013  . CHB (complete heart block) ([place]) 11/06/2013  . Single chamber St. Jude pacemaker 2013 11/06/2013  . Chronic atrial fibrillation ([place]) 01/29/2013  . Long term current use of anticoagulant therapy 01/29/2013  .  Dyspepsia and other specified disorders of function of stomach 09/16/2012  . HLD 02/01/2012  . HTN (hypertension) 02/01/2012  . Carotid stenosis 02/01/2012  . Renal artery stenosis ([place]) 02/01/2012  . CAD (coronary artery disease) 02/01/2012  . Pacemaker end of life 02/01/2012  . Umbilical hernia [id number]  . Esophageal reflux 08/21/2011  . Hemorrhage of rectum and anus 04/26/2011  . HERNIA, UMBILICAL [id number]  . UNSPECIFIED CARDIAC DYSRHYTHMIA 11/11/2007  . Transient cerebral ischemia 11/11/2007  . ESOPHAGEAL STRICTURE 11/11/2007  . DIVERTICULOSIS OF COLON 11/11/2007  . Non-Hodgkin's lymphoma of intestine ([place]) 09/03/2007    Past Medical History  Diagnosis Date  . Atrial fibrillation ([place])   . Lymphoma ([place]) 1998    of colon   . Diverticulosis   . Esophageal stricture   . Hyperlipidemia   . Hypertension   . CVA (cerebral infarction)   . Colon polyps   . Urinary frequency   . Umbilical hernia   . Congenital heart block   . CHB (complete heart block) ([place]) 11/06/2013  . Cardiomyopathy, ischemic 11/07/2013  . Chronic combined systolic and diastolic CHF (congestive heart failure) [facility])     Past Surgical History  Procedure Laterality Date  . Appendectomy  1953  . Colectomy      for lymphoma  . Pacemaker placement      replaced 3 x  .  Tonsillectomy    . Neck fusion    . Back surgery    . Carotid doppler  11/04/2012    Proximal Rt ICA 50-99% diameter reduction; Lft Bulb demonstrated mild amount homogeneous plaque-not hemodynamically significant; Lft ICA-normal patency.  . Pacemaker generator change  01/31/2012    St Jude Med [employer] DR RF model [id number] serial [phone]  . Cardiac catheterization  01/06/2003    Recommend medical therapy  . Cardiovascular stress test  07/16/2012    Mild-moderate perfusion defect seen in Basal inferior, Mid inferior, and Apica lateral consistent with infarct/scar. No scintigraphic evidence for inducible myocardial ischemia. No ECG changes. EKG  negative for ischemia.  . Transthoracic echocardiogram  11/04/2012    EF 123456, systolic function moderately reduced, mild regurg of the aortic and mitral valves.  . Permanent pacemaker generator change N/A 01/31/2012    Procedure: PERMANENT PACEMAKER GENERATOR CHANGE;  Surgeon: Sanda Klein, MD;  Location: [facility];     Social History  Substance Use Topics  . Smoking status: Never Smoker   . Smokeless tobacco: None  . Alcohol Use: No    Family History  Problem Relation Age of Onset  . Prostate cancer Father   . Colon cancer Neg Hx   . Diabetes Maternal Grandmother   . Heart disease Mother   . Stroke Mother   . Heart attack Neg Hx     Allergies  Allergen Reactions  . Statins     Leg cramps.   Tolerates pravastatin.    Marland Kitchen Penicillins Rash    Medication list has been reviewed and updated.  Current Outpatient Prescriptions on File Prior to Visit  Medication Sig Dispense Refill  . amiodarone (PACERONE) 200 MG tablet TAKE 1 TABLET (200 MG TOTAL) BY MOUTH DAILY. 90 tablet 1  . aspirin 81 MG tablet Take 81 mg by mouth daily.      Marland Kitchen azithromycin (ZITHROMAX) 250 MG tablet Start with 2 tablets today, then 1 daily thereafter. 6 tablet 0  . furosemide (LASIX) 20 MG tablet Take 1 tablet (20 mg total) by mouth daily. Take an extra tablet daily PRN for weight gain > 3 lbs in a day or 5 lbs in a week. 60 tablet 11  . HYDROcodone-homatropine (HYCODAN) 5-1.5 MG/5ML syrup Take 5 mLs by mouth at bedtime as needed. 120 mL 0  . losartan (COZAAR) 25 MG tablet Take 1 tablet (25 mg total) by mouth daily. 30 tablet 11  . metoprolol (LOPRESSOR) 50 MG tablet Take 1 tablet (50 mg total) by mouth 2 (two) times daily. 270 tablet 3  . potassium chloride SA (K-DUR,KLOR-CON) 20 MEQ tablet Take 1 tablet (20 mEq total) by mouth daily. Use as directed 30 tablet 0  . pravastatin (PRAVACHOL) 80 MG tablet Take 1 tablet (80 mg total) by mouth daily. 90 tablet 3  . Tamsulosin HCl (FLOMAX) 0.4 MG CAPS Take 0.4  mg by mouth daily.     Marland Kitchen warfarin (COUMADIN) 2.5 MG tablet Take 1 tablet by mouth daily or as directed by coumadin clinic 90 tablet 1   No current facility-administered medications on file prior to visit.    Review of Systems:  As per HPI- otherwise negative.   Physical Examination: Filed Vitals:   11/13/15 1113  BP: 154/86  Pulse: 80  Temp: 98 F (36.7 C)  Resp: 17   Filed Vitals:   11/13/15 1113  Height: 5' 8.5" (1.74 m)  Weight: 237 lb (107.502 kg)   Body mass index is 35.51  kg/(m^2). Ideal Body Weight: Weight in (lb) to have BMI = 25: 166.5  GEN: WDWN, NAD, Non-toxic, A & O x 3, looks well but bloated  HEENT: Atraumatic, Normocephalic. Neck supple. No masses, No LAD. Ears and Nose: No external deformity. CV: RRR, No M/G/R. No JVD. No thrill. No extra heart sounds. PULM: CTA B, no wheezes, crackles, rhonchi. No retractions. No resp. distress. No accessory muscle use. EXTR: No c/c.  He has edema to the thighs bilaterally  NEURO Normal gait.  PSYCH: Normally interactive. Conversant. Not depressed or anxious appearing.  Calm demeanor.   [facility] reading (PRIMARY) by  Dr. Lorelei Pont. CXR: he has increased vascular congestion in the bilateral bases.  Stable cardiomegaly and pacer leads in place   Dg Chest 2 View  11/13/2015  CLINICAL DATA:  Persistent cough since 10/27/2015 EXAM: CHEST  2 VIEW COMPARISON:  Chest x-ray 10/27/2015. FINDINGS: The heart is mildly enlarged but stable. The pacer wires are stable. Bibasilar opacity suspicious for pneumonia with probable small bilateral pleural effusions. The bony thorax is intact. IMPRESSION: Bibasilar infiltrates. Electronically Signed   By: Marijo Sanes M.D.   On: 11/13/2015 11:59   Dg Chest 2 View  10/27/2015  CLINICAL DATA:  Congestive heart failure.  Cough. EXAM: CHEST  2 VIEW COMPARISON:  10/25/2015 FINDINGS: Dual lead pacer noted. There is an abandoned pacer lead extending to the right atrium which is discontinuous superiorly and  does not appear attached to the pulse generator. Mild enlargement of the cardiopericardial silhouette noted without edema. Persistent vague density in the right upper lobe is nonspecific but could represent some resolving edema or faint pneumonia. IMPRESSION: 1. Stable appearance of indistinct density in the right upper lobe, possibly from low-level edema or pneumonia. Follow up to clearance is recommended -consider radiography in 4 weeks time. 2. Mild enlargement of the cardiopericardial silhouette. Electronically Signed   By: Van Clines M.D.   On: 10/27/2015 10:25   Dg Chest 2 View  10/25/2015  CLINICAL DATA:  Shortness of breath and cough EXAM: CHEST - 2 VIEW COMPARISON:  12/18/2014 FINDINGS: Cardiac shadow is stable. Pacing device is again seen. Mild central vascular congestion is noted without definitive interstitial edema. There is some mild increased density noted in the right upper lobe which may represent some very early infiltrate. No sizable effusion is seen. IMPRESSION: Mild vascular congestion. Changes suggestive of early right upper lobe infiltrate. Electronically Signed   By: Inez Catalina M.D.   On: 10/25/2015 10:14    Assessment and Plan: Acute on chronic systolic congestive heart failure (HCC) - Plan: DG Chest 2 View  Abnormal weight gain  Here today with gain of 20 lbs since his last visit and likely CHF exacerbation.  CXR read as above from today's film- however I would be more suspicious that his lung findings are due to CHF In any case he will see cardiology today at 2pm for further management.  Did not draw labs (BNP, etc) today as we cannot get them back prior to his planned appt   Signed Lamar Blinks, MD

## 2015-11-13 NOTE — Progress Notes (Deleted)
Patient ID: Benjamin Mckay, male   DOB: [birth year], 70 y.o.   MRN: [id number]    Date:  11/13/2015   ID:  Benjamin Mckay, DOB 1946/09/06, MRN [id number]  PCP:  Robyn Haber, MD  Primary Cardiologist:  ***   Chief Complaint  Patient presents with  . Follow-up    no chest pain, alot shortness of breath, alot swelling, no cramping, no dizziness or lightheadedness     History of Present Illness: Benjamin Mckay is a 70 y.o. male  who presents for follow-up for ischemic cardiomyopathy with congestive heart failure, permanent atrial fibrillation, complete heart block and a history of nonsustained VT.  He was evaluated by Dr. Cristopher Peru for possible upgrade of his pacemaker to a biventricular ICD system. After discussing the risk/benefit balance of this procedure decision was made to proceed conservatively. He has had multiple previous pacemaker procedures (5 procedures on the left side) and is at high risk of infection, mechanical complications and even death with intervention, especially since he is pacemaker dependent. The decision was made to proceed conservatively. He does have complaints of fatigue and poor stamina, although he denies dyspnea. He was able to play golf earlier today. He states that leg weakness and pain seems to be a bigger limitation to physical activity than shortness of breath.  He has not had acute heart failure exacerbation since March 2016. His problems with ataxia and dizziness seem to have improved substantially. Device interrogation today shows normal function. He does not have any escape rhythm. He has 100% ventricular pacing (programmed VVIR). Battery voltage is 2.99 V with estimated generator longevity of 10-12 years. Right ventricular lead impedance is 450 ohms, right ventricular lead threshold is 0.65 @ 0.5 ms pulse width. No episodes of ventricular tachycardia have occurred.  The patient currently denies nausea, vomiting, fever, chest pain, shortness of breath,  orthopnea, dizziness, PND, cough, congestion, abdominal pain, hematochezia, melena, lower extremity edema, claudication.  Wt Readings from Last 3 Encounters:  11/13/15 238 lb (107.956 kg)  11/13/15 237 lb (107.502 kg)  10/27/15 218 lb (98.884 kg)     Past Medical History  Diagnosis Date  . Atrial fibrillation ([place])   . Lymphoma ([place]) 1998    of colon   . Diverticulosis   . Esophageal stricture   . Hyperlipidemia   . Hypertension   . CVA (cerebral infarction)   . Colon polyps   . Urinary frequency   . Umbilical hernia   . Congenital heart block   . CHB (complete heart block) ([place]) 11/06/2013  . Cardiomyopathy, ischemic 11/07/2013  . Chronic combined systolic and diastolic CHF (congestive heart failure) ([place])     Current Outpatient Prescriptions  Medication Sig Dispense Refill  . amiodarone (PACERONE) 200 MG tablet TAKE 1 TABLET (200 MG TOTAL) BY MOUTH DAILY. 90 tablet 1  . aspirin 81 MG tablet Take 81 mg by mouth daily.      . furosemide (LASIX) 20 MG tablet Take 1 tablet (20 mg total) by mouth daily. Take an extra tablet daily PRN for weight gain > 3 lbs in a day or 5 lbs in a week. 60 tablet 11  . losartan (COZAAR) 25 MG tablet Take 1 tablet (25 mg total) by mouth daily. 30 tablet 11  . metoprolol (LOPRESSOR) 50 MG tablet Take 1 tablet (50 mg total) by mouth 2 (two) times daily. 270 tablet 3  . pravastatin (PRAVACHOL) 80 MG tablet Take 1 tablet (80 mg total) by mouth daily. 90 tablet  3  . Tamsulosin HCl (FLOMAX) 0.4 MG CAPS Take 0.4 mg by mouth daily.     Marland Kitchen warfarin (COUMADIN) 2.5 MG tablet Take 1 tablet by mouth daily or as directed by coumadin clinic 90 tablet 1  . potassium chloride SA (K-DUR,KLOR-CON) 20 MEQ tablet Take 1 tablet (20 mEq total) by mouth daily. Use as directed (Patient not taking: Reported on 11/13/2015) 30 tablet 0   No current facility-administered medications for this visit.    Allergies:    Allergies  Allergen Reactions  . Statins     Leg cramps.    Tolerates pravastatin.    Marland Kitchen Penicillins Rash    Social History:  The patient  reports that he has never smoked. He does not have any smokeless tobacco history on file. He reports that he does not drink alcohol or use illicit drugs.   Family history:   Family History  Problem Relation Age of Onset  . Prostate cancer Father   . Colon cancer Neg Hx   . Diabetes Maternal Grandmother   . Heart disease Mother   . Stroke Mother   . Heart attack Neg Hx     ROS:  Please see the history of present illness.  All other systems reviewed and negative.   PHYSICAL EXAM: VS:  BP 110/64 mmHg  Pulse 64  Ht 5' 8.5" (1.74 m)  Wt 238 lb (107.956 kg)  BMI 35.66 kg/m2 Well nourished, well developed, in no acute distress HEENT: Pupils are equal round react to light accommodation extraocular movements are intact.  Neck: no JVDNo cervical lymphadenopathy. Cardiac: Regular rate and rhythm without murmurs rubs or gallops. Lungs:  clear to auscultation bilaterally, no wheezing, rhonchi or rales Abd: soft, nontender, positive bowel sounds all quadrants, no hepatosplenomegaly Ext: no lower extremity edema.  2+ radial and dorsalis pedis pulses. Skin: warm and dry Neuro:  Grossly normal  EKG:  ***     ASSESSMENT AND PLAN:  Problem List Items Addressed This Visit    None

## 2015-11-13 NOTE — Progress Notes (Signed)
ANTICOAGULATION CONSULT NOTE - Initial Consult  Pharmacy Consult for Warfarin Indication: atrial fibrillation  Allergies  Allergen Reactions  . Statins     Leg cramps.   Tolerates pravastatin.    Marland Kitchen Penicillins Rash    Patient Measurements: Height: 5\' 9"  (175.3 cm) Weight: 232 lb 11.2 oz (105.552 kg) IBW/kg (Calculated) : 70.7  Vital Signs: Temp: 98.7 F (37.1 C) (01/30 1549) Temp Source: Oral (01/30 1549) BP: 128/64 mmHg (01/30 1549) Pulse Rate: 64 (01/30 1549)  Labs: No results for input(s): HGB, HCT, PLT, APTT, LABPROT, INR, HEPARINUNFRC, HEPRLOWMOCWT, CREATININE, CKTOTAL, CKMB, TROPONINI in the last 72 hours.  Estimated Creatinine Clearance: 81.1 mL/min (by C-G formula based on Cr of 1.03).   Medical History: Past Medical History  Diagnosis Date  . Atrial fibrillation ([place])   . Lymphoma ([place]) 1998    of colon   . Diverticulosis   . Esophageal stricture   . Hyperlipidemia   . Hypertension   . CVA (cerebral infarction)   . Colon polyps   . Urinary frequency   . Umbilical hernia   . Congenital heart block   . CHB (complete heart block) ([place]) 11/06/2013  . Cardiomyopathy, ischemic 11/07/2013  . Chronic combined systolic and diastolic CHF (congestive heart failure) (H[facility])     Medications:  Prescriptions prior to admission  Medication Sig Dispense Refill Last Dose  . amiodarone (PACERONE) 200 MG tablet TAKE 1 TABLET (200 MG TOTAL) BY MOUTH DAILY. 90 tablet 1 Taking  . aspirin 81 MG tablet Take 81 mg by mouth daily.     Taking  . furosemide (LASIX) 20 MG tablet Take 1 tablet (20 mg total) by mouth daily. Take an extra tablet daily PRN for weight gain > 3 lbs in a day or 5 lbs in a week. 60 tablet 11 Taking  . losartan (COZAAR) 25 MG tablet Take 1 tablet (25 mg total) by mouth daily. 30 tablet 11 Taking  . metoprolol (LOPRESSOR) 50 MG tablet Take 1 tablet (50 mg total) by mouth 2 (two) times daily. 270 tablet 3 Taking  . potassium chloride SA (K-DUR,KLOR-CON) 20 MEQ  tablet Take 1 tablet (20 mEq total) by mouth daily. Use as directed (Patient not taking: Reported on 11/13/2015) 30 tablet 0 Not Taking  . pravastatin (PRAVACHOL) 80 MG tablet Take 1 tablet (80 mg total) by mouth daily. 90 tablet 3 Taking  . Tamsulosin HCl (FLOMAX) 0.4 MG CAPS Take 0.4 mg by mouth daily.    Taking  . warfarin (COUMADIN) 2.5 MG tablet Take 1 tablet by mouth daily or as directed by coumadin clinic 90 tablet 1 Taking    Assessment: 23 YOM who was admitted with worsening lower extremity edema over last 3 weeks. He was on Coumadin prior to admission for h/o permanent atrial fibrillation. He also has a pacemaker. Pharmacy consulted to resume warfarin on admission. INR on admission is elevated at 3.45. Hgb wnl. Plt 149k. Last dose of Coumadin was yesterday.  Home warfarin dose: 1.25 mg on Mon/Fri; 2.5 mg on all other days    Goal of Therapy:  INR 2-3 Monitor platelets by anticoagulation protocol: Yes   Plan:  -Hold Coumadin today -Monitor daily INR  Albertina Parr, PharmD., BCPS Clinical Pharmacist Pager [phone]

## 2015-11-13 NOTE — Progress Notes (Signed)
Patient ID: Benjamin Mckay, male   DOB: 06-Jul-1946, 70 y.o.   MRN: [id number]  See H&P

## 2015-11-13 NOTE — Patient Instructions (Signed)
Go directly to [facility] from here.

## 2015-11-13 NOTE — H&P (Signed)
Benjamin Mckay is an 70 y.o. male.  Chief Complaint: Severe LE edema, SOB   Patient ID: Benjamin Mckay, male DOB: [birth year], 70 y.o. MRN: [id number]    Date: 11/13/2015   ID: Benjamin Mckay, DOB 03/17/1946, MRN [id number]  PCP: Robyn Haber, MD Primary Cardiologist: Jaques Mineer  Chief Complaint  Patient presents with  . Follow-up    no chest pain, alot shortness of breath, alot swelling, no cramping, no dizziness or lightheadedness    History of Present Illness: Benjamin Mckay is a 70 y.o. male who presents for follow-up for ischemic cardiomyopathy with congestive heart failure, permanent atrial fibrillation, complete heart block and a history of nonsustained VT.  He was evaluated by Dr. Cristopher Peru for possible upgrade of his pacemaker to a biventricular ICD system. After discussing the risk/benefit balance of this procedure decision was made to proceed conservatively. He has had multiple previous pacemaker procedures (5 procedures on the left side) and is at high risk of infection, mechanical complications and even death with intervention, especially since he is pacemaker dependent. The decision was made to proceed conservatively. He does have complaints of fatigue and poor stamina, although he denies dyspnea. He was able to play golf earlier today. He states that leg weakness and pain seems to be a bigger limitation to physical activity than shortness of breath.  Patient has noticed worsening lower extremity edema over the last 3 weeks. He started by coughing and was treated for pneumonia with a Z-Pak. After the first week he went on a cruise for 2 weeks and his weight is increased by another 20 pounds in the 2 week.. Last August his weight was 208 pounds today is 238. He did increase his Lasix to 20 mg twice daily while he was on the cruise. Reports the swelling is to his thighs and scrotum. He's noticed an increase in shortness of breath and more  coughing today. He says his been trying to watch his sodium intake however, I suspect all the food on the cruise was loaded with salt. Surprisingly he denies orthopnea.  The patient currently denies nausea, vomiting, fever, chest pain, dizziness, PND, cough, congestion, abdominal pain, hematochezia, melena, claudication.  Wt Readings from Last 3 Encounters:  11/13/15 238 lb (107.956 kg)  11/13/15 237 lb (107.502 kg)  10/27/15 218 lb (98.884 kg)   Medications: Medication Sig  amiodarone (PACERONE) 200 MG tablet TAKE 1 TABLET (200 MG TOTAL) BY MOUTH DAILY.  aspirin 81 MG tablet Take 81 mg by mouth daily.   furosemide (LASIX) 20 MG tablet Take 1 tablet (20 mg total) by mouth daily. Take an extra tablet daily PRN for weight gain > 3 lbs in a day or 5 lbs in a week.  losartan (COZAAR) 25 MG tablet Take 1 tablet (25 mg total) by mouth daily.  metoprolol (LOPRESSOR) 50 MG tablet Take 1 tablet (50 mg total) by mouth 2 (two) times daily.  pravastatin (PRAVACHOL) 80 MG tablet Take 1 tablet (80 mg total) by mouth daily.  Tamsulosin HCl (FLOMAX) 0.4 MG CAPS Take 0.4 mg by mouth daily.   warfarin (COUMADIN) 2.5 MG tablet Take 1 tablet by mouth daily or as directed by coumadin clinic  potassium chloride SA (K-DUR,KLOR-CON) 20 MEQ tablet Take 1 tablet (20 mEq total) by mouth daily. Use as directed Patient not taking: Reported on 11/13/2015     Past Medical History  Diagnosis Date  . Atrial fibrillation ([place])   . Lymphoma ([place]) 1998    of colon   .  Diverticulosis   . Esophageal stricture   . Hyperlipidemia   . Hypertension   . CVA (cerebral infarction)   . Colon polyps   . Urinary frequency   . Umbilical hernia   . Congenital heart block   . CHB (complete heart block) ([place]) 11/06/2013  . Cardiomyopathy, ischemic 11/07/2013  . Chronic combined systolic and diastolic CHF (congestive heart failure) [facility])      Past Surgical History  Procedure Laterality Date  . Appendectomy  1953  . Colectomy      for lymphoma  . Pacemaker placement      replaced 3 x  . Tonsillectomy    . Neck fusion    . Back surgery    . Carotid doppler  11/04/2012    Proximal Rt ICA 50-99% diameter reduction; Lft Bulb demonstrated mild amount homogeneous plaque-not hemodynamically significant; Lft ICA-normal patency.  . Pacemaker generator change  01/31/2012    St Jude Med [employer] DR RF model [id number] serial [phone]  . Cardiac catheterization  01/06/2003    Recommend medical therapy  . Cardiovascular stress test  07/16/2012    Mild-moderate perfusion defect seen in Basal inferior, Mid inferior, and Apica lateral consistent with infarct/scar. No scintigraphic evidence for inducible myocardial ischemia. No ECG changes. EKG negative for ischemia.  . Transthoracic echocardiogram  11/04/2012    EF [id number], systolic function moderately reduced, mild regurg of the aortic and mitral valves.  . Permanent pacemaker generator change N/A 01/31/2012    Procedure: PERMANENT PACEMAKER GENERATOR CHANGE; Surgeon: Sanda Klein, MD; Location: [place] CATH [facility];     Family History  Problem Relation Age of Onset  . Prostate cancer Father   . Colon cancer Neg Hx   . Diabetes Maternal Grandmother   . Heart disease Mother   . Stroke Mother   . Heart attack Neg Hx    Social History:  reports that he has never smoked. He does not have any smokeless tobacco history on file. He reports that he does not drink alcohol or use illicit drugs.  Allergies:  Allergies  Allergen Reactions  . Statins     Leg cramps. Tolerates pravastatin.   Marland Kitchen Penicillins Rash     (Not in a hospital admission)   Lab Results Last 48 Hours    No results found for this or any previous visit (from the past 48 hour(s)).    Imaging Results (Last 48  hours)    Dg Chest 2 View  11/13/2015 CLINICAL DATA: Persistent cough since 10/27/2015 EXAM: CHEST 2 VIEW COMPARISON: Chest x-ray 10/27/2015. FINDINGS: The heart is mildly enlarged but stable. The pacer wires are stable. Bibasilar opacity suspicious for pneumonia with probable small bilateral pleural effusions. The bony thorax is intact. IMPRESSION: Bibasilar infiltrates. Electronically Signed By: Marijo Sanes M.D. On: 11/13/2015 11:59     Review of Systems  Constitutional: Negative for fever and diaphoresis.  Respiratory: Positive for cough and shortness of breath.  Cardiovascular: Positive for leg swelling. Negative for chest pain, orthopnea and PND.  Gastrointestinal: Negative for nausea, vomiting, abdominal pain, blood in stool and melena.  Genitourinary:   Severe scrotal swelling  Musculoskeletal: Negative for myalgias.  Neurological: Negative for dizziness and weakness.  All other systems reviewed and are negative.   Blood pressure 110/64, pulse 64, height 5' 8.5" (1.74 m), weight 238 lb (107.956 kg). Physical Exam  Nursing note and vitals reviewed. Constitutional: He is oriented to person, place, and time. He appears well-developed and well-nourished. No distress.  HENT:  Head:  Normocephalic and atraumatic.  Eyes: EOM are normal. Pupils are equal, round, and reactive to light.  Neck: Normal range of motion. Neck supple. No JVD present.  Cardiovascular: Normal rate, regular rhythm, S1 normal and S2 normal.  No murmur heard. Pulses:  Radial pulses are 2+ on the right side, and 2+ on the left side.   Dorsalis pedis pulses are 2+ on the right side, and 2+ on the left side.  Respiratory: Effort normal. He has rales.  GI: Soft. Bowel sounds are normal. He exhibits no distension. There is no tenderness.  Musculoskeletal: He exhibits edema.  Severe 3+ pitting edema up to his buttocks and scrotum  Lymphadenopathy:   He has no cervical adenopathy.   Neurological: He is alert and oriented to person, place, and time. He exhibits normal muscle tone.  Skin: Skin is warm and dry.  His mild superficial wound on his right lower extremity which he had scratched it on something. Now it is weeping fluid  Psychiatric: He has a normal mood and affect.     Assessment/Plan Acute on chronic systolic heart failure Hyperlipidemia Chronic atrial fibrillation Chronic Coumadin Essential hypertension Permanent pacemaker Ischemic cardiomyopathy  Benjamin Mckay is approximately 30 pounds volume overloaded with edema up to his thighs and scrotum. He'll be admitted for IV diuresis which will take several days. We'll start with IV Lasix 40 mg twice daily with supplemental potassium. Admission will check a CBC, CMP, BNP. He probably had a chest x-ray earlier today which showed CHF. Redo 2-D echocardiogram.  Tarri Fuller, PAC 11/13/2015, 3:19 PM   I have seen and examined the patient along with Tarri Fuller, PA. I have reviewed the chart, notes and new data. I agree with PA/NP's note.  Key new complaints: marked and uncomfortable edema of the lower extremities, less severe left heart failure symptoms (exertional dyspnea class 2-3, cough) Key examination changes: edema to groin/scrotum, weeping from right leg superficial laceration Key new findings / data: labs pending  PLAN: Hospitalize for IV diuretics, electrolyte monitoring. Reconsider CRT-P?   Sanda Klein, MD, [place] [phone] 11/13/2015, 4:57 PM

## 2015-11-13 NOTE — Patient Instructions (Addendum)
Because you received an x-ray today, you will receive an invoice from [facility]. Please contact [facility] at [phone] with questions or concerns regarding your invoice. Our billing staff will not be able to assist you with those questions.  You have an appt to see Georgana Curio at [facility] today at 2pm for your heart failure exacerbation Be sure to let them know that we did a chest x-ray for you today.  Arrive around 1:45

## 2015-11-14 DIAGNOSIS — I110 Hypertensive heart disease with heart failure: Principal | ICD-10-CM

## 2015-11-14 DIAGNOSIS — E785 Hyperlipidemia, unspecified: Secondary | ICD-10-CM

## 2015-11-14 DIAGNOSIS — I442 Atrioventricular block, complete: Secondary | ICD-10-CM | POA: Diagnosis not present

## 2015-11-14 DIAGNOSIS — C8599 Non-Hodgkin lymphoma, unspecified, extranodal and solid organ sites: Secondary | ICD-10-CM | POA: Diagnosis not present

## 2015-11-14 DIAGNOSIS — I482 Chronic atrial fibrillation: Secondary | ICD-10-CM | POA: Diagnosis not present

## 2015-11-14 DIAGNOSIS — I472 Ventricular tachycardia: Secondary | ICD-10-CM | POA: Diagnosis not present

## 2015-11-14 DIAGNOSIS — I5023 Acute on chronic systolic (congestive) heart failure: Secondary | ICD-10-CM | POA: Diagnosis not present

## 2015-11-14 LAB — BASIC METABOLIC PANEL
ANION GAP: 6 (ref 5–15)
BUN: 21 mg/dL — ABNORMAL HIGH (ref 6–20)
CALCIUM: 8.5 mg/dL — AB (ref 8.9–10.3)
CO2: 28 mmol/L (ref 22–32)
Chloride: 104 mmol/L (ref 101–111)
Creatinine, Ser: 1.24 mg/dL (ref 0.61–1.24)
GFR, EST NON AFRICAN AMERICAN: 58 mL/min — AB (ref >60)
GLUCOSE: 97 mg/dL (ref 65–99)
POTASSIUM: 3.5 mmol/L (ref 3.5–5.1)
SODIUM: 138 mmol/L (ref 135–145)

## 2015-11-14 LAB — PROTIME-INR
INR: 3.3 — AB (ref 0.00–1.49)
PROTHROMBIN TIME: 32.9 s — AB (ref 11.6–15.2)

## 2015-11-14 MED ORDER — GUAIFENESIN-CODEINE 100-10 MG/5ML PO SOLN
5.0000 mL | Freq: Four times a day (QID) | ORAL | Status: DC | PRN
  Administered 2015-11-14 – 2015-11-17 (×5): 5 mL via ORAL
  Filled 2015-11-14 (×5): qty 5

## 2015-11-14 MED ORDER — SPIRONOLACTONE 25 MG PO TABS
12.5000 mg | ORAL_TABLET | Freq: Every day | ORAL | Status: DC
  Administered 2015-11-15 – 2015-11-19 (×5): 12.5 mg via ORAL
  Filled 2015-11-14 (×5): qty 1

## 2015-11-14 MED ORDER — FUROSEMIDE 10 MG/ML IJ SOLN
8.0000 mg/h | INTRAVENOUS | Status: DC
  Administered 2015-11-14: 4 mg/h via INTRAVENOUS
  Administered 2015-11-17: 8 mg/h via INTRAVENOUS
  Filled 2015-11-14 (×8): qty 25

## 2015-11-14 NOTE — Discharge Instructions (Addendum)
PLEASE call [phone] to schedule the following visits at [facility] at [facility], [facility], [address], [place], [place]: 1. Lab visit in one week for CBC with differential and hepatic function panel. 2. Lab visit in 3 weeks for TSH and free T4 level. 3. Office visit with Dr. Buddy Duty in 3 or 4 weeks. * THE OFFICE  MAY CALL YOU BUT PLEASE CALL THEM TO GET ALL OF THIS ARRANGED*  Information on my medicine - Coumadin   (Warfarin)  This medication education was reviewed with me or my healthcare representative as part of my discharge preparation.  The pharmacist that spoke with me during my hospital stay was:  Arty Baumgartner, [facility]  Why was Coumadin prescribed for you? Coumadin was prescribed for you because you have a blood clot or a medical condition that can cause an increased risk of forming blood clots. Blood clots can cause serious health problems by blocking the flow of blood to the heart, lung, or brain. Coumadin can prevent harmful blood clots from forming. As a reminder your indication for Coumadin is:   Stroke Prevention Because Of Atrial Fibrillation  What test will check on my response to Coumadin? While on Coumadin (warfarin) you will need to have an INR test regularly to ensure that your dose is keeping you in the desired range. The INR (international normalized ratio) number is calculated from the result of the laboratory test called prothrombin time (PT).  If an INR APPOINTMENT HAS NOT ALREADY BEEN MADE FOR YOU please schedule an appointment to have this lab work done by your health care provider within 7 days. Your INR goal is usually a number between:  2 to 3 or your provider may give you a more narrow range like 2-2.5.  Ask your health care provider during an office visit what your goal INR is.  What  do you need to  know  About  COUMADIN? Take Coumadin  (warfarin) exactly as prescribed by your healthcare provider about the same time each day.  DO NOT stop taking without talking to the doctor who prescribed the medication.  Stopping without other blood clot prevention medication to take the place of Coumadin may increase your risk of developing a new clot or stroke.  Get refills before you run out.  What do you do if you miss a dose? If you miss a dose, take it as soon as you remember on the same day then continue your regularly scheduled regimen the next day.  Do not take two doses of Coumadin at the same time.  Important Safety Information A possible side effect of Coumadin (Warfarin) is an increased risk of bleeding. You should call your healthcare provider right away if you experience any of the following: ? Bleeding from an injury or your nose that does not stop. ? Unusual colored urine (red or dark brown) or unusual colored stools (red or black). ? Unusual bruising for unknown reasons. ? A serious fall or if you hit your head (even if there is no bleeding).  Some foods or medicines interact with Coumadin (warfarin) and might alter your response to warfarin. To help avoid this: ? Eat a balanced diet, maintaining a consistent amount of Vitamin K. ? Notify your provider about major diet changes you plan to make. ? Avoid alcohol or limit your intake to 1 drink for women and 2 drinks for men per day. (1 drink is 5 oz. wine, 12 oz. beer, or 1.5 oz. liquor.)  Make sure that ANY health care provider who prescribes medication for you knows that you are taking Coumadin (warfarin).  Also make sure the healthcare provider who is monitoring your Coumadin knows when you have started a new medication including herbals and non-prescription products.  Coumadin (Warfarin)  Major Drug Interactions  Increased Warfarin Effect Decreased Warfarin Effect  Alcohol (large quantities) Antibiotics (esp. Septra/Bactrim, Flagyl, Cipro) Amiodarone  (Cordarone) Aspirin (ASA) Cimetidine (Tagamet) Megestrol (Megace) NSAIDs (ibuprofen, naproxen, etc.) Piroxicam (Feldene) Propafenone (Rythmol SR) Propranolol (Inderal) Isoniazid (INH) Posaconazole (Noxafil) Barbiturates (Phenobarbital) Carbamazepine (Tegretol) Chlordiazepoxide (Librium) Cholestyramine (Questran) Griseofulvin Oral Contraceptives Rifampin Sucralfate (Carafate) Vitamin K   Coumadin (Warfarin) Major Herbal Interactions  Increased Warfarin Effect Decreased Warfarin Effect  Garlic Ginseng Ginkgo biloba Coenzyme Q10 Green tea St. Johns wort    Coumadin (Warfarin) FOOD Interactions  Eat a consistent number of servings per week of foods HIGH in Vitamin K (1 serving =  cup)  Collards (cooked, or boiled & drained) Kale (cooked, or boiled & drained) Mustard greens (cooked, or boiled & drained) Parsley *serving size only =  cup Spinach (cooked, or boiled & drained) Swiss chard (cooked, or boiled & drained) Turnip greens (cooked, or boiled & drained)  Eat a consistent number of servings per week of foods MEDIUM-HIGH in Vitamin K (1 serving = 1 cup)  Asparagus (cooked, or boiled & drained) Broccoli (cooked, boiled & drained, or raw & chopped) Brussel sprouts (cooked, or boiled & drained) *serving size only =  cup Lettuce, raw (green leaf, endive, romaine) Spinach, raw Turnip greens, raw & chopped   These websites have more information on Coumadin (warfarin):  FailFactory.se; VeganReport.com.au;

## 2015-11-14 NOTE — Progress Notes (Signed)
Utilization review completed. Emyah Roznowski, RN, BSN. 

## 2015-11-14 NOTE — Progress Notes (Signed)
ANTICOAGULATION CONSULT NOTE - Follow Up Consult  Pharmacy Consult for Coumadin Indication: atrial fibrillation  Patient Measurements: Height: 5\' 9"  (175.3 cm) Weight: 230 lb 4.8 oz (104.463 kg) IBW/kg (Calculated) : 70.7  Vital Signs: Temp: 97.2 F (36.2 C) (01/31 1152) Temp Source: Oral (01/31 1152) BP: 122/54 mmHg (01/31 1152) Pulse Rate: 65 (01/31 1152)  Labs:  Recent Labs  11/13/15 1704 11/14/15 0536  HGB 13.2  --   HCT 38.1*  --   PLT 149*  --   LABPROT 34.0* 32.9*  INR 3.45* 3.30*  CREATININE 1.08 1.24    Estimated Creatinine Clearance: 67 mL/min (by C-G formula based on Cr of 1.24).  Assessment:   INR remains supratherapeutic (3.30) after holding Coumadin yesterday.   Home regimen: 2.5 mg daily except 1.25 mg on Mondays and Fridays.   Last several outpatient INRs have been therapeutic.  Goal of Therapy:  INR 2-3 Monitor platelets by anticoagulation protocol: Yes   Plan:   No Coumadin again today.  Discussed with patient.  Continue daily PT/INR.  Arty Baumgartner, [place] Pager: [phone] 11/14/2015,3:59 PM

## 2015-11-14 NOTE — Progress Notes (Signed)
PATIENT ID: [id number] with ischemic cardiomyopathy LVEF 30-35%, CAD, permanent atrial fibrillatin, CHB s/p ICD, and NSVT here with acute on chronic systolic heart failure.  INTERVAL HISTORY: Diuresed 1.7L since yesterday.  SUBJECTIVE:  Patient does not feel any difference since admission.  Notes that his breathing may be slightly improved, but not much difference.  Edema unchanged.  He is not getting as good of a urinary response as he did on lasix drip in the past.    PHYSICAL EXAM Filed Vitals:   11/13/15 2042 11/14/15 0507 11/14/15 0900 11/14/15 1152  BP: 126/60 134/66 126/66 122/54  Pulse: 69 64  65  Temp: 98.3 F (36.8 C) 98.3 F (36.8 C)  97.2 F (36.2 C)  TempSrc: Oral Oral  Oral  Resp: 20 20  18   Height:      Weight:  104.463 kg (230 lb 4.8 oz)    SpO2: 97% 94% 96% 98%   General:  Well appearing. No acute distress Neck: JVP 2 upper neck at 45. No carotid bruits Lungs:  Crackles at the right base. No rhonchi or wheezes. Heart:  Regular rate and rhythm. 2/6 systolic murmur at the right upper sternal border. No rubs or gallops. Abdomen:  Soft. Nontender, nondistended. Active bowel sounds Extremities:  3+ pitting edema to the scrotum.   LABS: Lab Results  Component Value Date   TROPONINI <0.03 12/08/2014   Results for orders placed or performed during the hospital encounter of 11/13/15 (from the past 24 hour(s))  Brain natriuretic peptide     Status: Abnormal   Collection Time: 11/13/15  5:04 PM  Result Value Ref Range   B Natriuretic Peptide 552.5 (H) 0.0 - 100.0 pg/mL  CBC WITH DIFFERENTIAL     Status: Abnormal   Collection Time: 11/13/15  5:04 PM  Result Value Ref Range   WBC 5.0 4.0 - 10.5 K/uL   RBC 4.02 (L) 4.22 - 5.81 MIL/uL   Hemoglobin 13.2 13.0 - 17.0 g/dL   HCT 38.1 (L) 39.0 - 52.0 %   MCV 94.8 78.0 - 100.0 fL   MCH 32.8 26.0 - 34.0 pg   MCHC 34.6 30.0 - 36.0 g/dL   RDW 13.4 11.5 - 15.5 %   Platelets 149 (L) 150 - 400 K/uL   Neutrophils Relative % 55 %    Neutro Abs 2.7 1.7 - 7.7 K/uL   Lymphocytes Relative 23 %   Lymphs Abs 1.1 0.7 - 4.0 K/uL   Monocytes Relative 16 %   Monocytes Absolute 0.8 0.1 - 1.0 K/uL   Eosinophils Relative 6 %   Eosinophils Absolute 0.3 0.0 - 0.7 K/uL   Basophils Relative 0 %   Basophils Absolute 0.0 0.0 - 0.1 K/uL  Comprehensive metabolic panel     Status: Abnormal   Collection Time: 11/13/15  5:04 PM  Result Value Ref Range   Sodium 142 135 - 145 mmol/L   Potassium 4.0 3.5 - 5.1 mmol/L   Chloride 104 101 - 111 mmol/L   CO2 28 22 - 32 mmol/L   Glucose, Bld 98 65 - 99 mg/dL   BUN 21 (H) 6 - 20 mg/dL   Creatinine, Ser 1.08 0.61 - 1.24 mg/dL   Calcium 8.9 8.9 - 10.3 mg/dL   Total Protein 5.6 (L) 6.5 - 8.1 g/dL   Albumin 2.7 (L) 3.5 - 5.0 g/dL   AST 48 (H) 15 - 41 U/L   ALT 61 17 - 63 U/L   Alkaline Phosphatase 95 38 - 126  U/L   Total Bilirubin 1.8 (H) 0.3 - 1.2 mg/dL   GFR calc non Af Amer >60 >60 mL/min   GFR calc Af Amer >60 >60 mL/min   Anion gap 10 5 - 15  TSH     Status: Abnormal   Collection Time: 11/13/15  5:04 PM  Result Value Ref Range   TSH 0.034 (L) 0.350 - 4.500 uIU/mL  Protime-INR     Status: Abnormal   Collection Time: 11/13/15  5:04 PM  Result Value Ref Range   Prothrombin Time 34.0 (H) 11.6 - 15.2 seconds   INR 3.45 (H) 0.00 - [id number]  Basic metabolic panel     Status: Abnormal   Collection Time: 11/14/15  5:36 AM  Result Value Ref Range   Sodium 138 135 - 145 mmol/L   Potassium 3.5 3.5 - 5.1 mmol/L   Chloride 104 101 - 111 mmol/L   CO2 28 22 - 32 mmol/L   Glucose, Bld 97 65 - 99 mg/dL   BUN 21 (H) 6 - 20 mg/dL   Creatinine, Ser 1.24 0.61 - 1.24 mg/dL   Calcium 8.5 (L) 8.9 - 10.3 mg/dL   GFR calc non Af Amer 58 (L) >60 mL/min   GFR calc Af Amer >60 >60 mL/min   Anion gap 6 5 - 15  Protime-INR     Status: Abnormal   Collection Time: 11/14/15  5:36 AM  Result Value Ref Range   Prothrombin Time 32.9 (H) 11.6 - 15.2 seconds   INR 3.30 (H) 0.00 - 1.49    Intake/Output Summary  (Last 24 hours) at 11/14/15 1413 Last data filed at 11/14/15 1300  Gross per 24 hour  Intake    580 ml  Output   2360 ml  Net  -1780 ml    EKG:  pending  ASSESSMENT AND PLAN:  Active Problems:   HLD   HTN (hypertension)   Chronic atrial fibrillation (HCC)   Long term current use of anticoagulant therapy   CHB (complete heart block) (HCC)   Single chamber St. Jude pacemaker 2013   Cardiomyopathy, ischemic   Acute on chronic systolic heart failure (HCC)   # Acute on chronic systolic heart failure: Recent dietary indiscretion on a cruise.  Patient admitted with 3+ pitting edema to the scrotum.  He has diuresed 1.7L since yesterday on lasix 40mg  IV bid.  He remained significantly volume overloaded. We will convert him to a Lasix drip at 4 mg per hour. We will also start spironolactone 12.5 mg daily. Ideally, he would also be started on Entresto.  However, we will not make that many changes at one time. We'll plan to start [facility] prior to discharge.  Will check an EKG.  Echo pending.   # Chronic atrial fibrillation: Continue amiodarone, metoprolol and warfarin per pharmacy consult.  # Hypertension: Blood pressure well-controlled.    # Hyperlipidemia: Continue pravastatin.  # CAD: Not an active issue.  Continue aspirin, statin and beta blocker.  Will check an EKG.  Elmon Shader C. Oval Linsey, MD, [facility] 11/14/2015 2:13 PM

## 2015-11-15 ENCOUNTER — Telehealth: Payer: Self-pay | Admitting: Cardiovascular Disease

## 2015-11-15 ENCOUNTER — Inpatient Hospital Stay (HOSPITAL_COMMUNITY): Payer: PPO

## 2015-11-15 DIAGNOSIS — I442 Atrioventricular block, complete: Secondary | ICD-10-CM | POA: Diagnosis not present

## 2015-11-15 DIAGNOSIS — I509 Heart failure, unspecified: Secondary | ICD-10-CM

## 2015-11-15 DIAGNOSIS — I10 Essential (primary) hypertension: Secondary | ICD-10-CM | POA: Diagnosis not present

## 2015-11-15 DIAGNOSIS — E785 Hyperlipidemia, unspecified: Secondary | ICD-10-CM | POA: Diagnosis not present

## 2015-11-15 DIAGNOSIS — I255 Ischemic cardiomyopathy: Secondary | ICD-10-CM | POA: Diagnosis not present

## 2015-11-15 DIAGNOSIS — I482 Chronic atrial fibrillation: Secondary | ICD-10-CM | POA: Diagnosis not present

## 2015-11-15 DIAGNOSIS — I5023 Acute on chronic systolic (congestive) heart failure: Secondary | ICD-10-CM | POA: Diagnosis not present

## 2015-11-15 DIAGNOSIS — E0590 Thyrotoxicosis, unspecified without thyrotoxic crisis or storm: Secondary | ICD-10-CM | POA: Diagnosis not present

## 2015-11-15 LAB — BASIC METABOLIC PANEL
ANION GAP: 12 (ref 5–15)
BUN: 19 mg/dL (ref 6–20)
CHLORIDE: 101 mmol/L (ref 101–111)
CO2: 26 mmol/L (ref 22–32)
Calcium: 8.5 mg/dL — ABNORMAL LOW (ref 8.9–10.3)
Creatinine, Ser: 1.13 mg/dL (ref 0.61–1.24)
GFR calc non Af Amer: >60 mL/min (ref >60)
Glucose, Bld: 98 mg/dL (ref 65–99)
POTASSIUM: 4.1 mmol/L (ref 3.5–5.1)
SODIUM: 139 mmol/L (ref 135–145)

## 2015-11-15 LAB — T4, FREE

## 2015-11-15 LAB — PROTIME-INR
INR: 2.76 — AB (ref 0.00–1.49)
PROTHROMBIN TIME: 28.8 s — AB (ref 11.6–15.2)

## 2015-11-15 LAB — TSH: TSH: 0.04 u[IU]/mL — AB (ref 0.350–4.500)

## 2015-11-15 MED ORDER — BENZONATATE 100 MG PO CAPS
200.0000 mg | ORAL_CAPSULE | Freq: Three times a day (TID) | ORAL | Status: DC | PRN
Start: 2015-11-15 — End: 2015-11-24
  Administered 2015-11-15 – 2015-11-23 (×12): 200 mg via ORAL
  Filled 2015-11-15 (×12): qty 2

## 2015-11-15 MED ORDER — WARFARIN SODIUM 2.5 MG PO TABS
2.5000 mg | ORAL_TABLET | Freq: Once | ORAL | Status: AC
  Administered 2015-11-15: 2.5 mg via ORAL
  Filled 2015-11-15 (×2): qty 1

## 2015-11-15 NOTE — Telephone Encounter (Signed)
Patient Spouse Dentist)  Brought FMLA ([place]) Forms, AUTH and Check to [facility] for processing.  Sent to [employer] @ [employer] CHAPS to [employer] on 11/15/15. lp

## 2015-11-15 NOTE — Progress Notes (Addendum)
Patient Name: Benjamin Mckay [facility] Date of Encounter: 11/15/2015     Active Problems:   HLD   HTN (hypertension)   Chronic atrial fibrillation ([place])   Long term current use of anticoagulant therapy   CHB (complete heart block) ([place])   Single chamber St. Jude pacemaker 2013   Cardiomyopathy, ischemic   Acute on chronic systolic heart failure (HCC)    PATIENT ID: [id number] with ischemic cardiomyopathy LVEF 30-35%, CAD, permanent atrial fibrillatin, CHB s/p ICD, and NSVT here with acute on chronic systolic heart failure.  SUBJECTIVE: Patient states that his swelling is unchanged since yesterday. Does admit to using the bathroom more since placed on IV Lasix drip yesterday afternoon but does not notice any change in his leg swelling. Notes that his urine is dark in color. Patient denies SOB while lying in bed, palpitations, chest pain, and orthopnea.   CURRENT MEDS . amiodarone  200 mg Oral Daily  . aspirin EC  81 mg Oral Daily  . losartan  25 mg Oral Daily  . metoprolol succinate  25 mg Oral Daily  . potassium chloride  20 mEq Oral BID  . pravastatin  80 mg Oral QHS  . sodium chloride flush  3 mL Intravenous Q12H  . spironolactone  12.5 mg Oral Daily  . tamsulosin  0.4 mg Oral QHS  . Warfarin - Pharmacist Dosing Inpatient   Does not apply q1800    OBJECTIVE  Filed Vitals:   11/14/15 2054 11/15/15 0001 11/15/15 0544 11/15/15 0959  BP: 137/67 123/47 139/56 138/59  Pulse: 65 65 71 72  Temp: 98.1 F (36.7 C) 98.2 F (36.8 C) 98.9 F (37.2 C)   TempSrc: Oral Oral Oral   Resp: 18 20 18    Height:      Weight:   229 lb 8 oz (104.101 kg)   SpO2: 97% 96% 96% 97%    Intake/Output Summary (Last 24 hours) at 11/15/15 1054 Last data filed at 11/15/15 0852  Gross per 24 hour  Intake    560 ml  Output   2025 ml  Net  -1465 ml   Filed Weights   11/13/15 1549 11/14/15 0507 11/15/15 0544  Weight: 232 lb 11.2 oz (105.552 kg) 230 lb 4.8 oz (104.463 kg) 229 lb 8 oz (104.101 kg)     PHYSICAL EXAM  General: Well developed, well nourished pleasant male in NAD. Neuro: Alert and oriented X 3. Moves all extremities spontaneously. Psych: Normal affect. HEENT:  Normal  Neck: Supple without bruits or JVD. Lungs:  Resp regular and unlabored. Mild crackles auscultated in posterior bases of lungs b/l. No rhonchi or wheezes.  Heart: Regular rate and rhythm. Systolic murmur noted at RUSB. No rubs or gallops.  Abdomen: Soft, non-tender, non-distended, BS + x 4.  Extremities: No clubbing, cyanosis  DP/Radials 2+ and equal bilaterally. 3+ pitting edema in lower extremities b/l to scrotum.   Accessory Clinical Findings  CBC  Recent Labs  11/13/15 1704  WBC 5.0  NEUTROABS 2.7  HGB 13.2  HCT 38.1*  MCV 94.8  PLT 123456*   Basic Metabolic Panel  Recent Labs  11/14/15 0536 11/15/15 0430  NA 138 139  K 3.5 4.1  CL 104 101  CO2 28 26  GLUCOSE 97 98  BUN 21* 19  CREATININE 1.24 1.13  CALCIUM 8.5* 8.5*   Liver Function Tests  Recent Labs  11/13/15 1704  AST 48*  ALT 61  ALKPHOS 95  BILITOT 1.8*  PROT 5.6*  ALBUMIN  2.7*  Thyroid Function Tests  Recent Labs  11/13/15 1704  TSH 0.034*   Echo: pending  EKG: 11/14/15 EKG showed electronic ventricular pacemaker at 68 bpm, no acute changes compared to prior EKGs.   TELE: Ventricular paced rhythm, rate 72 bpm. Intermittent PVCs.   Radiology/Studies  Dg Chest 2 View  11/13/2015  CLINICAL DATA:  Persistent cough since 10/27/2015 EXAM: CHEST  2 VIEW COMPARISON:  Chest x-ray 10/27/2015. FINDINGS: The heart is mildly enlarged but stable. The pacer wires are stable. Bibasilar opacity suspicious for pneumonia with probable small bilateral pleural effusions. The bony thorax is intact. IMPRESSION: Bibasilar infiltrates. Electronically Signed   By: Marijo Sanes M.D.   On: 11/13/2015 11:59   Dg Chest 2 View  10/27/2015  CLINICAL DATA:  Congestive heart failure.  Cough. EXAM: CHEST  2 VIEW COMPARISON:  10/25/2015  FINDINGS: Dual lead pacer noted. There is an abandoned pacer lead extending to the right atrium which is discontinuous superiorly and does not appear attached to the pulse generator. Mild enlargement of the cardiopericardial silhouette noted without edema. Persistent vague density in the right upper lobe is nonspecific but could represent some resolving edema or faint pneumonia. IMPRESSION: 1. Stable appearance of indistinct density in the right upper lobe, possibly from low-level edema or pneumonia. Follow up to clearance is recommended -consider radiography in 4 weeks time. 2. Mild enlargement of the cardiopericardial silhouette. Electronically Signed   By: Van Clines M.D.   On: 10/27/2015 10:25   Dg Chest 2 View  10/25/2015  CLINICAL DATA:  Shortness of breath and cough EXAM: CHEST - 2 VIEW COMPARISON:  12/18/2014 FINDINGS: Cardiac shadow is stable. Pacing device is again seen. Mild central vascular congestion is noted without definitive interstitial edema. There is some mild increased density noted in the right upper lobe which may represent some very early infiltrate. No sizable effusion is seen. IMPRESSION: Mild vascular congestion. Changes suggestive of early right upper lobe infiltrate. Electronically Signed   By: Inez Catalina M.D.   On: 10/25/2015 10:14    ASSESSMENT AND PLAN  1. Acute on chronic systolic heart failure: Patient has  diuresed 1.4L since yesterday on Lasix drip at 4 mg per hour and spironolactone 12.5 mg daily.  He remains volume overloaded.  Weight trending down since admission (232>>230>>229).  Advice on increasing IV lasix? Creatinine stable at 1.13. K is stable. BP stable at 138/59. Echo pending. EKG showed electronic ventricular pacemaker at 68 bpm, no acute changes compared to prior EKGs.   2. Chronic atrial fibrillation: rate is controlled. Continue amiodarone, metoprolol and warfarin per pharmacy consult.  3. Hypertension: Blood pressure well-controlled.Last BP  reading 138/59  4.  Hyperlipidemia: Continue pravastatin.  4. CAD: Not an active issue. Patient denies chest pain. Continue aspirin, statin and beta blocker.   5. Hyperthyroidism: TSH low at 0.034. Patient has no prior history of thyroid issues. We will recheck TSH and check free T3/T4.  Signed, Tenna Delaine PA-Student   I have seen and examined patient along with PA-S. I agree with the assessment and plan as outlined above. We will continue diuresis as he remains volume overloaded. BP, renal function and K are all stable. Continue strict I/Os and daily weights. Continue current meds for chronic atrial fibrillation and CAD. We will recheck TSH and check Free T3/T4. MD to see and will provide further recommendations.    History and all data above reviewed.  Mr. Cruthirds is a 49M with ischemic cardiomyopathy LVEF 30-35%, CAD, permanent  atrial fibrillatin, CHB s/p ICD, and NSVT here with acute on chronic systolic heart failure. Patient examined.  I agree with the findings as above.  The patient exam reveals:  Gen: Well-appearing.  NAD Neck: JVP above clavicle sitting upright. COR: RRR.  II/VI systolic murmur at LUSB. No r/g.  Normal S1/S2.   Lungs: Diminished at the R base with associated crackles at the R base.   Abd: Soft.  NT, ND.  +BS Ext: WWP.  3+ pitting edema to upper thigh bilaterally.    All available labs, radiology testing, previous records reviewed. Agree with documented assessment and plan. 1.7L net negative yesterday but weight it unchanged.  Will increase lasix drip to 8 mg/hr.  Renal function and electrolytes are stable.  BP well-controlled.   Sahasra Belue C. Oval Linsey, MD, [facility]  11/15/2015 12:49 PM

## 2015-11-15 NOTE — Progress Notes (Signed)
  Echocardiogram 2D Echocardiogram has been performed.  Darlina Sicilian M 11/15/2015, 1:51 PM

## 2015-11-15 NOTE — Progress Notes (Signed)
ANTICOAGULATION CONSULT NOTE - Follow Up Consult  Pharmacy Consult for Coumadin Indication: atrial fibrillation  Patient Measurements: Height: 5\' 9"  (175.3 cm) Weight: 229 lb 8 oz (104.101 kg) (scale c) IBW/kg (Calculated) : 70.7  Vital Signs: Temp: 98.2 F (36.8 C) (02/01 1205) Temp Source: Oral (02/01 1205) BP: 100/71 mmHg (02/01 1205) Pulse Rate: 65 (02/01 1205)  Labs:  Recent Labs  11/13/15 1704 11/14/15 0536 11/15/15 0430  HGB 13.2  --   --   HCT 38.1*  --   --   PLT 149*  --   --   LABPROT 34.0* 32.9* 28.8*  INR 3.45* 3.30* 2.76*  CREATININE 1.08 1.24 1.13    Estimated Creatinine Clearance: 73.4 mL/min (by C-G formula based on Cr of 1.13).  Assessment:   INR down to 2.76 today after holding Coumadin the last 2 days when INR >3.   Home regimen: 2.5 mg daily except 1.25 mg on Mondays and Fridays.   Last several outpatient INRs have been therapeutic.  Goal of Therapy:  INR 2-3 Monitor platelets by anticoagulation protocol: Yes   Plan:   Coumadin 2.5 mg today, usual Wednesday dose.  Discussed with patient.  Continue daily PT/INR for now.  Arty Baumgartner, [place] Pager: [phone] 11/15/2015,12:33 PM

## 2015-11-16 ENCOUNTER — Encounter (HOSPITAL_COMMUNITY): Payer: Self-pay | Admitting: General Practice

## 2015-11-16 DIAGNOSIS — E0590 Thyrotoxicosis, unspecified without thyrotoxic crisis or storm: Secondary | ICD-10-CM | POA: Diagnosis not present

## 2015-11-16 DIAGNOSIS — Z950 Presence of cardiac pacemaker: Secondary | ICD-10-CM | POA: Diagnosis not present

## 2015-11-16 DIAGNOSIS — I5023 Acute on chronic systolic (congestive) heart failure: Secondary | ICD-10-CM | POA: Diagnosis not present

## 2015-11-16 DIAGNOSIS — I482 Chronic atrial fibrillation: Secondary | ICD-10-CM | POA: Diagnosis not present

## 2015-11-16 DIAGNOSIS — I442 Atrioventricular block, complete: Secondary | ICD-10-CM | POA: Diagnosis not present

## 2015-11-16 DIAGNOSIS — I10 Essential (primary) hypertension: Secondary | ICD-10-CM | POA: Diagnosis not present

## 2015-11-16 DIAGNOSIS — I255 Ischemic cardiomyopathy: Secondary | ICD-10-CM

## 2015-11-16 DIAGNOSIS — E785 Hyperlipidemia, unspecified: Secondary | ICD-10-CM | POA: Diagnosis not present

## 2015-11-16 DIAGNOSIS — I472 Ventricular tachycardia: Secondary | ICD-10-CM

## 2015-11-16 HISTORY — DX: Thyrotoxicosis, unspecified without thyrotoxic crisis or storm: E05.90

## 2015-11-16 LAB — BASIC METABOLIC PANEL
Anion gap: 12 (ref 5–15)
BUN: 17 mg/dL (ref 6–20)
CALCIUM: 8.4 mg/dL — AB (ref 8.9–10.3)
CO2: 27 mmol/L (ref 22–32)
CREATININE: 1.27 mg/dL — AB (ref 0.61–1.24)
Chloride: 98 mmol/L — ABNORMAL LOW (ref 101–111)
GFR calc Af Amer: >60 mL/min (ref >60)
GFR calc non Af Amer: 56 mL/min — ABNORMAL LOW (ref >60)
GLUCOSE: 101 mg/dL — AB (ref 65–99)
Potassium: 3.7 mmol/L (ref 3.5–5.1)
Sodium: 137 mmol/L (ref 135–145)

## 2015-11-16 LAB — PROTIME-INR
INR: 2.36 — ABNORMAL HIGH (ref 0.00–1.49)
Prothrombin Time: 25.6 seconds — ABNORMAL HIGH (ref 11.6–15.2)

## 2015-11-16 LAB — T3, FREE: T3, Free: 12.6 pg/mL — ABNORMAL HIGH (ref 2.0–4.4)

## 2015-11-16 MED ORDER — AMIODARONE HCL 200 MG PO TABS
200.0000 mg | ORAL_TABLET | Freq: Every day | ORAL | Status: DC
  Administered 2015-11-16 – 2015-11-24 (×9): 200 mg via ORAL
  Filled 2015-11-16 (×9): qty 1

## 2015-11-16 MED ORDER — WARFARIN SODIUM 2.5 MG PO TABS
2.5000 mg | ORAL_TABLET | Freq: Once | ORAL | Status: AC
Start: 2015-11-16 — End: 2015-11-16
  Administered 2015-11-16: 2.5 mg via ORAL
  Filled 2015-11-16: qty 1

## 2015-11-16 NOTE — Care Management Important Message (Signed)
Important Message  Patient Details  Name: Benjamin Mckay MRN: [id number] Date of Birth: [birth year]   Medicare Important Message Given:  Yes    Ellissa Ayo P Quashawn Jewkes 11/16/2015, 1:05 PM

## 2015-11-16 NOTE — Progress Notes (Signed)
Pt a/o, c/o cough PRN Tessalon given as ordered, pt remains on lasix gtt @ 8 ml/hr, VSS, pt stable

## 2015-11-16 NOTE — Consult Note (Signed)
ELECTROPHYSIOLOGY CONSULT NOTE    Patient ID: Benjamin Mckay MRN: [id number], DOB/AGE: December 23, 1945 70 y.o.  Admit date: 11/13/2015 Date of Consult: 11/16/2015  Primary Physician: Robyn Haber, MD Primary Cardiologist: Cr. Croitoru  Reason for Consultation: Permanent AFib, NSVT  HPI: Benjamin Mckay is a 70 y.o. male with PMHx of permanent AFib, CHB, PPM/pacer dependent, ICM/CHF, NSVT, HTN, HLD, CVA, was admitted to [facility] 11/13/15 with c/o increasing pedal edema, significant weight gain (after a 2 week cruise) eventually edematous in his thighs and scrotum.  A week prior to his cruise with a cough and treated out patient for a pneumonia.  He was admitted with CHF exacerbation, with a 30lb weight increase from his baseline. And is on lasix gtt.  He has been seen by EP (Dr. Lovena Le) for evaluation of upgrade of his PPM to ICD with ICM and NSVT, After discussing the risk/benefit balance of this procedure decision was made to proceed conservatively. He has had multiple previous pacemaker procedures (5 procedures on the left side) and is at high risk of infection, mechanical complications and even death with intervention, especially since he is pacemaker dependent. The decision was made to proceed conservatively.  Dr. Lovena Le noted that his history dates back to 1984 when he presented with complete heart block and underwent permanent pacemaker insertion. He subsequently developed atrial fibrillation. He underwent right ventricular lead revision 3 years ago during his fourth pacemaker insertion. He had a pacemaker change out in 1995 and again in 2004. The patient has been stable but developed sustained monomorphic ventricular tachycardia in 2013 which was treated with amiodarone.  On the amiodarone has had no additional VT, and has been 100% paced.  During this hospitalization it has been noted that he has become markedly hyperthyroid, suspect secondary to the amiodarone.  Cardiology has discussed  discontinuation of his amiodarone but the patient is very reluctant despite the likely hood of toxicity.  He mentions about 2-3 years ago visual changes and the need for the first time to require glasses, says he thinks was the amiodarone because his eye doctor made a mention about the medicine but did not suggest he needed to consider stopping it.  Denies any further decline in his vision.  Past Medical History  Diagnosis Date  . Atrial fibrillation ([place])   . Lymphoma ([place]) 1998    of colon   . Diverticulosis   . Esophageal stricture   . Hyperlipidemia   . Hypertension   . CVA (cerebral infarction)   . Colon polyps   . Urinary frequency   . Umbilical hernia   . Congenital heart block   . CHB (complete heart block) ([place]) 11/06/2013  . Cardiomyopathy, ischemic 11/07/2013  . Chronic combined systolic and diastolic CHF (congestive heart failure) ([place])   . Hyperthyroidism 11/16/2015     Surgical History:  Past Surgical History  Procedure Laterality Date  . Appendectomy  1953  . Colectomy      for lymphoma  . Pacemaker placement      replaced 3 x  . Tonsillectomy    . Neck fusion    . Back surgery    . Carotid doppler  11/04/2012    Proximal Rt ICA 50-99% diameter reduction; Lft Bulb demonstrated mild amount homogeneous plaque-not hemodynamically significant; Lft ICA-normal patency.  . Pacemaker generator change  01/31/2012    St Jude Med [employer] DR RF model [id number] serial [id number]  . Cardiac catheterization  01/06/2003    Recommend medical therapy  . Cardiovascular  stress test  07/16/2012    Mild-moderate perfusion defect seen in Basal inferior, Mid inferior, and Apica lateral consistent with infarct/scar. No scintigraphic evidence for inducible myocardial ischemia. No ECG changes. EKG negative for ischemia.  . Transthoracic echocardiogram  11/04/2012    EF 123456, systolic function moderately reduced, mild regurg of the aortic and mitral valves.  . Permanent pacemaker generator change  N/A 01/31/2012    Procedure: PERMANENT PACEMAKER GENERATOR CHANGE;  Surgeon: Sanda Klein, MD;  Location: [facility];      Prescriptions prior to admission  Medication Sig Dispense Refill Last Dose  . amiodarone (PACERONE) 200 MG tablet TAKE 1 TABLET (200 MG TOTAL) BY MOUTH DAILY. (Patient taking differently: TAKE 1 TABLET (200 MG TOTAL) BY MOUTH DAILY AT 2PM) 90 tablet 1 11/13/2015 at 1400  . aspirin EC 81 MG tablet Take 81 mg by mouth daily.   11/13/2015 at Unknown time  . Dextromethorphan Polistirex (DELSYM PO) Take 10 mLs by mouth daily.   11/13/2015 at Unknown time  . furosemide (LASIX) 20 MG tablet Take 1 tablet (20 mg total) by mouth daily. Take an extra tablet daily PRN for weight gain > 3 lbs in a day or 5 lbs in a week. (Patient taking differently: Take 20 mg by mouth See admin instructions. Take 1 tablet (20 mg) by mouth daily, maytake an extra tablet daily as needed  for weight gain > 3 lbs in a day or >5 lbs in a week.) 60 tablet 11 11/13/2015 at Unknown time  . hydrocortisone cream 1 % Apply 1 application topically 2 (two) times daily as needed for itching.   2 weeks ago  . ibuprofen (ADVIL,MOTRIN) 200 MG tablet Take 400 mg by mouth every 6 (six) hours as needed (pain).   week ago  . losartan (COZAAR) 25 MG tablet Take 1 tablet (25 mg total) by mouth daily. 30 tablet 11 11/13/2015 at Unknown time  . Melatonin 5 MG TABS Take 5 mg by mouth at bedtime.   11/04/2015  . metoprolol (LOPRESSOR) 50 MG tablet Take 1 tablet (50 mg total) by mouth 2 (two) times daily. 270 tablet 3 11/13/2015 at 1000  . pravastatin (PRAVACHOL) 80 MG tablet Take 1 tablet (80 mg total) by mouth daily. (Patient taking differently: Take 80 mg by mouth at bedtime. ) 90 tablet 3 11/12/2015 at Unknown time  . Tamsulosin HCl (FLOMAX) 0.4 MG CAPS Take 0.4 mg by mouth at bedtime.    11/12/2015 at Unknown time  . warfarin (COUMADIN) 2.5 MG tablet Take 1 tablet by mouth daily or as directed by coumadin clinic (Patient taking  differently: Take 1.25-2.5 mg by mouth daily at 6 PM. Take 1/2 tablet (1.25 mg) by mouth on Monday and Friday, take 1 tablet (2.5 mg) on Sunday, Tuesday, Wednesday, Thursday, Saturday or as directed by coumadin clinic) 90 tablet 1 11/12/2015 at Unknown time  . potassium chloride SA (K-DUR,KLOR-CON) 20 MEQ tablet Take 1 tablet (20 mEq total) by mouth daily. Use as directed (Patient not taking: Reported on 11/13/2015) 30 tablet 0 Not Taking at Unknown time    Inpatient Medications:  . amiodarone  200 mg Oral Daily  . aspirin EC  81 mg Oral Daily  . losartan  25 mg Oral Daily  . metoprolol succinate  25 mg Oral Daily  . potassium chloride  20 mEq Oral BID  . pravastatin  80 mg Oral QHS  . sodium chloride flush  3 mL Intravenous Q12H  . spironolactone  12.5  mg Oral Daily  . tamsulosin  0.4 mg Oral QHS  . warfarin  2.5 mg Oral ONCE-1800  . Warfarin - Pharmacist Dosing Inpatient   Does not apply q1800    Allergies:  Allergies  Allergen Reactions  . Adhesive [Tape] Other (See Comments)    Skin irritation - please use paper tape  . Statins Other (See Comments)    Leg cramps.   Tolerates pravastatin.    Marland Kitchen Penicillins Rash    Has patient had a PCN reaction causing immediate rash, facial/tongue/throat swelling, SOB or lightheadedness with hypotension: Yes Has patient had a PCN reaction causing severe rash involving mucus membranes or skin necrosis: No Has patient had a PCN reaction that required hospitalization No Has patient had a PCN reaction occurring within the last 10 years: No If all of the above answers are "NO", then may proceed with Cephalosporin use.    Social History   Social History  . Marital Status: Married    Spouse Name: [employer]  . Number of Children: 3  . Years of Education: BS   Occupational History  . Retired    Social History Main Topics  . Smoking status: Never Smoker   . Smokeless tobacco: Not on file  . Alcohol Use: No  . Drug Use: No  . Sexual  Activity: Not on file   Other Topics Concern  . Not on file   Social History Narrative   Consumes caffeine 2 cups per day       Family History  Problem Relation Age of Onset  . Prostate cancer Father   . Colon cancer Neg Hx   . Diabetes Maternal Grandmother   . Heart disease Mother   . Stroke Mother   . Heart attack Neg Hx      Review of Systems: All other systems reviewed and are otherwise negative except as noted above.  Physical Exam: Filed Vitals:   11/15/15 0959 11/15/15 1205 11/15/15 2155 11/16/15 0648  BP: 138/59 100/71 139/50 119/60  Pulse: 72 65 65 66  Temp:  98.2 F (36.8 C) 98.6 F (37 C) 98.3 F (36.8 C)  TempSrc:  Oral Oral Oral  Resp:  18 18 18   Height:      Weight:    222 lb 6.4 oz (100.88 kg)  SpO2: 97% 96% 98% 96%    GEN- The patient is well appearing, alert and oriented x 3 today.   HEENT: normocephalic, atraumatic; sclera clear, conjunctiva pink; hearing intact; oropharynx clear; neck supple, no JVP Lymph- no cervical lymphadenopathy Lungs- diminished at the bases L>R, normal work of breathing.  No wheezes, rales, rhonchi Heart- Regular rate and rhythm, 1/6 SM, no rubs or gallops, PMI not laterally displaced GI- soft, non-tender, non-distended Extremities- 3+ peripheral edema MS- no significant deformity or atrophy Skin- warm and dry, no rash or lesion Psych- euthymic mood, full affect Neuro- no gross deficits observed  Labs:   Lab Results  Component Value Date   WBC 5.0 11/13/2015   HGB 13.2 11/13/2015   HCT 38.1* 11/13/2015   MCV 94.8 11/13/2015   PLT 149* 11/13/2015    Recent Labs Lab 11/13/15 1704  11/16/15 0445  NA 142  < > 137  K 4.0  < > 3.7  CL 104  < > 98*  CO2 28  < > 27  BUN 21*  < > 17  CREATININE 1.08  < > 1.27*  CALCIUM 8.9  < > 8.4*  PROT 5.6*  --   --  BILITOT 1.8*  --   --   ALKPHOS 95  --   --   ALT 61  --   --   AST 48*  --   --   GLUCOSE 98  < > 101*  < > = values in this interval not displayed.      Radiology/Studies:  Dg Chest 2 View 11/13/2015  CLINICAL DATA:  Persistent cough since 10/27/2015 EXAM: CHEST  2 VIEW COMPARISON:  Chest x-ray 10/27/2015. FINDINGS: The heart is mildly enlarged but stable. The pacer wires are stable. Bibasilar opacity suspicious for pneumonia with probable small bilateral pleural effusions. The bony thorax is intact. IMPRESSION: Bibasilar infiltrates. Electronically Signed   By: Marijo Sanes M.D.   On: 11/13/2015 11:59    EKG: V paced TELEMETRY: V paced 11/15/15 Echocardiogram Study Conclusions - Left ventricle: The cavity size was normal. There was mild concentric hypertrophy. Systolic function was mildly reduced. The estimated ejection fraction was in the range of 45% to 50%. There is akinesis of the anteroseptal myocardium. Doppler parameters are consistent with high ventricular filling pressure. - Aortic valve: There was mild regurgitation. - Mitral valve: Calcified annulus. - Right ventricle: Pacer wire or catheter noted in right ventricle. - Right atrium: The atrium was mildly dilated. - Pulmonary arteries: Systolic pressure was moderately increased. PA peak pressure: 54 mm Hg (S). - Pericardium, extracardiac: There was a moderate-sized left pleural effusion.  12/08/14: Echocardiogram Study Conclusions - Left ventricle: The cavity size was normal. Wall thickness was increased in a pattern of moderate to severe LVH. Systolic function was moderately to severely reduced. The estimated ejection fraction was in the range of 30% to 35%. - Ventricular septum: Septal motion showed abnormal function and dyssynergy. These changes are consistent with right ventricular pacing. - Aortic valve: There was mild regurgitation. - Mitral valve: There was mild regurgitation. - Left atrium: The atrium was mildly to moderately dilated. - Right ventricle: The cavity size was mildly dilated. Wall thickness was normal.  DEVICE HISTORY:  PPM  implant 1984 secondary to CHB, generator changes 1995, 2004, 2013, with RV lead revision Current device is STJ device  Assessment and Plan:   1. CHF, ICM     Anasarca, on Lasix gtt, aldactone, K+     Fluid negative 7642ml     On BB/ARB     EF has improved by his echo here  2. Permanent AFib     Paced rhythm on telemetry     CHADS2Vasc is at least 4 on coumadin     INR 2.36      3. Hyperthyroidism     Likely secondary to amiodarone  3. Hx of VT     On amiodarone since 2013 or 2014     The patient hd c/o dizziness and near syncope that have completely resolved since being on the amiodarone.      The patient is hesitant to discontinue the amiodarone given he has not had any dizzy spells/near syncope since starting it but willing to consider other options.  Will await further discussion with Dr. Lovena Le for [place].   Venetia Night, PA-C 11/16/2015 2:59 PM  EP Attending  Patient seen and examined. His exam demonstrated I had seen him in the past and he has presented with worsening CHF and volume overload. He has been found to be hyperthyroid. He has had some diuresis and his volume status is improved. Question is what next. His PPM leads are old. However, they are working. I  would hope to avoid another operation if at all possible. If at all possible, would prefer to maintain amio and treat hyperthyroid. Might consider an Endocrine consult. Would continue his amio for now. I would not recommend PM lead extraction or insertion of a BiV ICD at this point.   Mikle Bosworth.D.

## 2015-11-16 NOTE — Progress Notes (Signed)
Patient Profile: 89M with ischemic cardiomyopathy LVEF 30-35%, CAD, permanent atrial fibrillation, CHB s/p ICD, and NSVT here with acute on chronic systolic heart failure.   Subjective: Feels fine. Happier with the amount of diuresis that has resulted after increasing his lasix drip rate. Feels that he is improving.   Objective: Vital signs in last 24 hours: Temp:  [98.2 F (36.8 C)-98.6 F (37 C)] 98.3 F (36.8 C) (02/02 0648) Pulse Rate:  [65-72] 66 (02/02 0648) Resp:  [18] 18 (02/02 0648) BP: (100-139)/(50-71) 119/60 mmHg (02/02 0648) SpO2:  [96 %-98 %] 96 % (02/02 0648) Weight:  [222 lb 6.4 oz (100.88 kg)] 222 lb 6.4 oz (100.88 kg) (02/02 0648) Last BM Date: 11/13/15  Intake/Output from previous day: 02/01 0701 - 02/02 0700 In: 560 [P.O.:560] Out: 3825 [Urine:3825] Intake/Output this shift:    Medications Current Facility-Administered Medications  Medication Dose Route Frequency Provider Last Rate Last Dose  . 0.9 %  sodium chloride infusion  250 mL Intravenous PRN Brett Canales, PA-C 10 mL/hr at 11/15/15 1257 250 mL at 11/15/15 1257  . acetaminophen (TYLENOL) tablet 650 mg  650 mg Oral Q4H PRN Brett Canales, PA-C      . amiodarone (PACERONE) tablet 200 mg  200 mg Oral Daily Brett Canales, PA-C   200 mg at 11/15/15 1001  . aspirin EC tablet 81 mg  81 mg Oral Daily Brett Canales, PA-C   81 mg at 11/15/15 [id number]  . benzonatate (TESSALON) capsule 200 mg  200 mg Oral TID PRN Skeet Latch, MD   200 mg at 11/15/15 2141  . furosemide (LASIX) 250 mg in dextrose 5 % 250 mL (1 mg/mL) infusion  8 mg/hr Intravenous Continuous Skeet Latch, MD 8 mL/hr at 11/15/15 1257 8 mg/hr at 11/15/15 1257  . guaiFENesin-codeine 100-10 MG/5ML solution 5 mL  5 mL Oral Q6H PRN Eileen Stanford, PA-C   5 mL at 11/15/15 1818  . losartan (COZAAR) tablet 25 mg  25 mg Oral Daily Brett Canales, PA-C   25 mg at 11/15/15 1001  . metoprolol succinate (TOPROL-XL) 24 hr tablet 25 mg  25 mg Oral Daily  Brett Canales, PA-C   25 mg at 11/15/15 1001  . ondansetron (ZOFRAN) injection 4 mg  4 mg Intravenous Q6H PRN Brett Canales, PA-C      . potassium chloride SA (K-DUR,KLOR-CON) CR tablet 20 mEq  20 mEq Oral BID Brett Canales, PA-C   20 mEq at 11/15/15 2141  . pravastatin (PRAVACHOL) tablet 80 mg  80 mg Oral QHS Brett Canales, PA-C   80 mg at 11/15/15 2142  . sodium chloride flush (NS) 0.9 % injection 3 mL  3 mL Intravenous Q12H Brett Canales, PA-C   3 mL at 11/15/15 1003  . sodium chloride flush (NS) 0.9 % injection 3 mL  3 mL Intravenous PRN Brett Canales, PA-C      . spironolactone (ALDACTONE) tablet 12.5 mg  12.5 mg Oral Daily Skeet Latch, MD   12.5 mg at 11/15/15 1001  . tamsulosin (FLOMAX) capsule 0.4 mg  0.4 mg Oral QHS Brett Canales, PA-C   0.4 mg at 11/15/15 2142  . Warfarin - Pharmacist Dosing Inpatient   Does not apply q1800 Lavenia Atlas, [place] at 11/13/15 1818    PE: General appearance: alert, cooperative and no distress Neck: no carotid bruit and no JVD Lungs: clear to auscultation bilaterally Heart: regular rate  and rhythm and SM at RUSB Extremities: 2-3+ bilateral pitting edema  Pulses: 2+ and symmetric Skin: warm and dry Neurologic: Grossly normal  Lab Results:   Recent Labs  11/13/15 1704  WBC 5.0  HGB 13.2  HCT 38.1*  PLT 149*   BMET  Recent Labs  11/14/15 0536 11/15/15 0430 11/16/15 0445  NA 138 139 137  K 3.5 4.1 3.7  CL 104 101 98*  CO2 28 26 27   GLUCOSE 97 98 101*  BUN 21* 19 17  CREATININE 1.24 1.13 1.27*  CALCIUM 8.5* 8.5* 8.4*   PT/INR  Recent Labs  11/14/15 0536 11/15/15 0430 11/16/15 0445  LABPROT 32.9* 28.8* 25.6*  INR 3.30* 2.76* 2.36*   I/O last 3 completed shifts: In: 680 [P.O.:680] Out: 4850 [Urine:4850]    Filed Weights   11/14/15 0507 11/15/15 0544 11/16/15 0648  Weight: 230 lb 4.8 oz (104.463 kg) 229 lb 8 oz (104.101 kg) 222 lb 6.4 oz (100.88 kg)     Assessment/Plan  Active Problems:   HLD    HTN (hypertension)   Chronic atrial fibrillation ([place])   Long term current use of anticoagulant therapy   CHB (complete heart block) (HCC)   Single chamber St. Jude pacemaker 2013   Cardiomyopathy, ischemic   Acute on chronic systolic heart failure ([place])   1. Acute on chronic systolic heart failure:  He remains volume overloaded. Good diuresis after increase in IV Lasix rate to 8 mg/hr yesterday.  -3.8L out yesterday. Weight trending down since admission (232>>230>>229 >>222, with 7 lb weight loss in the past 24 hrs. Slight bump in SCr from 1.13>>1.27. Will continue to monitor.  K is stable at 3.7. Will continue to monitor daily and will supplement as needed. BP stable at 119/60. Echo completed yesterday and LVEF is actually improved compared to prior, now at 45-50%. Normal RV.  Doppler parameters are consistent with high ventricular filling pressure. Continue diuresis with lasix drip. Continue strict I/Os, daily weights and low sodium diet.  Daily BMPs to monitor renal function and K.   2. Chronic atrial fibrillation: rate is controlled. Continue metoprolol and warfarin per pharmacy. 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 He also has a h/o of sustained symptomatic ventricular tachycardia, for which amiodarone has been used. Thus will discuss with MD regarding risk/ benefits to before discontinuing.  F/u thyroid studies as an outpatient.   3. Hypertension: Blood pressure well-controlled.Last BP reading 119/60  4. Hyperlipidemia: Continue pravastatin.  5. CAD: Not an active issue. Patient denies chest pain. Continue aspirin, statin and beta blocker.   6. Hyperthyroidism: Initial TSH low at 0.034. Repeat TSH also abnormal at 0.040. Free T3/T4 are elevated c/w hyperthyroidism. T3 12.6 and T4 >5.50. Will discuss with MD possible discontinuation of amiodarone. He will need f/u thyroid function test as an  outpatient.      LOS: 3 days    Brittainy M. Ladoris Gene 11/16/2015 7:52 AM

## 2015-11-16 NOTE — Progress Notes (Signed)
ANTICOAGULATION CONSULT NOTE - Follow Up Consult  Pharmacy Consult for Coumadin Indication: atrial fibrillation  Patient Measurements: Height: 5\' 9"  (175.3 cm) Weight: 222 lb 6.4 oz (100.88 kg) (scale c) IBW/kg (Calculated) : 70.7  Vital Signs: Temp: 98.3 F (36.8 C) (02/02 0648) Temp Source: Oral (02/02 0648) BP: 119/60 mmHg (02/02 0648) Pulse Rate: 66 (02/02 0648)  Labs:  Recent Labs  11/13/15 1704 11/14/15 0536 11/15/15 0430 11/16/15 0445  HGB 13.2  --   --   --   HCT 38.1*  --   --   --   PLT 149*  --   --   --   LABPROT 34.0* 32.9* 28.8* 25.6*  INR 3.45* 3.30* 2.76* 2.36*  CREATININE 1.08 1.24 1.13 1.27*    Estimated Creatinine Clearance: 64.3 mL/min (by C-G formula based on Cr of 1.27).  Assessment:   INR down to 2.36 today after holding Coumadin for 2 days when INR >3, then resuming with usual dose of 2.5 mg yesterday.   Home regimen: 2.5 mg daily except 1.25 mg on Mondays and Fridays.   Last several outpatient INRs have been therapeutic.  Goal of Therapy:  INR 2-3 Monitor platelets by anticoagulation protocol: Yes   Plan:   Coumadin 2.5 mg again today, usual Thursday dose.  Discussed with patient.  Continue daily PT/INR for now.  Arty Baumgartner, [place] Pager: [phone] 11/16/2015,1:06 PM

## 2015-11-17 ENCOUNTER — Inpatient Hospital Stay (HOSPITAL_COMMUNITY): Payer: PPO

## 2015-11-17 ENCOUNTER — Other Ambulatory Visit: Payer: Self-pay

## 2015-11-17 DIAGNOSIS — I482 Chronic atrial fibrillation: Secondary | ICD-10-CM | POA: Diagnosis not present

## 2015-11-17 DIAGNOSIS — R05 Cough: Secondary | ICD-10-CM | POA: Diagnosis not present

## 2015-11-17 DIAGNOSIS — E0580 Other thyrotoxicosis without thyrotoxic crisis or storm: Secondary | ICD-10-CM | POA: Diagnosis not present

## 2015-11-17 DIAGNOSIS — I255 Ischemic cardiomyopathy: Secondary | ICD-10-CM | POA: Diagnosis not present

## 2015-11-17 DIAGNOSIS — E0590 Thyrotoxicosis, unspecified without thyrotoxic crisis or storm: Secondary | ICD-10-CM | POA: Diagnosis not present

## 2015-11-17 DIAGNOSIS — I5023 Acute on chronic systolic (congestive) heart failure: Secondary | ICD-10-CM | POA: Diagnosis not present

## 2015-11-17 DIAGNOSIS — I442 Atrioventricular block, complete: Secondary | ICD-10-CM | POA: Diagnosis not present

## 2015-11-17 DIAGNOSIS — I10 Essential (primary) hypertension: Secondary | ICD-10-CM | POA: Diagnosis not present

## 2015-11-17 DIAGNOSIS — E785 Hyperlipidemia, unspecified: Secondary | ICD-10-CM | POA: Diagnosis not present

## 2015-11-17 LAB — BASIC METABOLIC PANEL
ANION GAP: 9 (ref 5–15)
BUN: 20 mg/dL (ref 6–20)
CO2: 32 mmol/L (ref 22–32)
Calcium: 8.6 mg/dL — ABNORMAL LOW (ref 8.9–10.3)
Chloride: 96 mmol/L — ABNORMAL LOW (ref 101–111)
Creatinine, Ser: 1.4 mg/dL — ABNORMAL HIGH (ref 0.61–1.24)
GFR calc Af Amer: 58 mL/min — ABNORMAL LOW (ref >60)
GFR, EST NON AFRICAN AMERICAN: 50 mL/min — AB (ref >60)
Glucose, Bld: 116 mg/dL — ABNORMAL HIGH (ref 65–99)
POTASSIUM: 3.7 mmol/L (ref 3.5–5.1)
Sodium: 137 mmol/L (ref 135–145)

## 2015-11-17 LAB — PROTIME-INR
INR: 2.17 — ABNORMAL HIGH (ref 0.00–1.49)
PROTHROMBIN TIME: 24 s — AB (ref 11.6–15.2)

## 2015-11-17 MED ORDER — WARFARIN SODIUM 2.5 MG PO TABS
2.5000 mg | ORAL_TABLET | Freq: Once | ORAL | Status: AC
  Administered 2015-11-17: 2.5 mg via ORAL
  Filled 2015-11-17: qty 1

## 2015-11-17 MED ORDER — METHIMAZOLE 10 MG PO TABS
10.0000 mg | ORAL_TABLET | Freq: Three times a day (TID) | ORAL | Status: DC
  Administered 2015-11-17 – 2015-11-24 (×20): 10 mg via ORAL
  Filled 2015-11-17 (×20): qty 1

## 2015-11-17 MED ORDER — GUAIFENESIN-CODEINE 100-10 MG/5ML PO SOLN
10.0000 mL | Freq: Four times a day (QID) | ORAL | Status: DC | PRN
  Administered 2015-11-17 – 2015-11-23 (×19): 10 mL via ORAL
  Filled 2015-11-17 (×19): qty 10

## 2015-11-17 NOTE — Addendum Note (Signed)
Addended by: Diana Eves on: 11/17/2015 03:15 PM   Modules accepted: Orders

## 2015-11-17 NOTE — Progress Notes (Signed)
Pt a/o, no c/o pain, pt requested PRN Tessalon for cough, med given as ordered, pt remains on lasix gtt @ 8 ml/hr, VSS, pt stable, diuresing well,

## 2015-11-17 NOTE — Consult Note (Signed)
   [facility] Inpatient Consult   11/17/2015  Benjamin Mckay [employer] [facility] [employer] Feb 19, 1946 [id number] Patient evaluated for community based chronic disease management services with [facility] as a benefit of patient's [employer] Medicare Insurance. Patient states that he is in with HF exacerbation.  He endorses Dr. Robyn Haber as his primary care provider. Consent form signed for post hospital follow up.  Offered automated telephonic calls for post hospital follow up and he states he would prefer to speak with a live person for follow up.  He states that he is pretty active.  Patient will receive post hospital discharge call and will be evaluated for monthly home visits for assessments and disease process education.  Left contact information and [facility] literature at bedside to take home. Will make Inpatient Case Manager aware that [facility] following. Of note, [facility] services does not replace or interfere with any services that are arranged by inpatient case management or social work.  For additional questions or referrals please contact:   Natividad Brood, RN BSN [facility] Liaison  [phone] business mobile phone Toll free office [phone]

## 2015-11-17 NOTE — Consult Note (Addendum)
Reason for Consult: amiodarone-associated thyrotoxicosis  Referring Physician: Skeet Latch, MD  HPI:  Benjamin Mckay notes that the edema in his legs worsened--prompting his hospital stay.  He reports associated weight gain.  He describes his abnormal thyroid function tests as a recent development.  To his knowledge, he has not used thyroid hormone or received any thyroid treatments.  In regards to possible precipitating or contributing factors:  he has taken amiodarone for about 3 years, his granddaughter recently had thyroid surgery for her benign thyroid nodule.    Past Medical History  Diagnosis Date  . Atrial fibrillation ([place])   . Lymphoma ([place]) 1998    of colon   . Diverticulosis   . Esophageal stricture   . Hyperlipidemia   . Hypertension   . CVA (cerebral infarction)   . Colon polyps   . Urinary frequency   . Umbilical hernia   . Congenital heart block   . CHB (complete heart block) ([place]) 11/06/2013  . Cardiomyopathy, ischemic 11/07/2013  . Chronic combined systolic and diastolic CHF (congestive heart failure) ([place])   . Hyperthyroidism 11/16/2015    Past Surgical History  Procedure Laterality Date  . Appendectomy  1953  . Colectomy      for lymphoma  . Pacemaker placement      replaced 3 x  . Tonsillectomy    . Neck fusion    . Back surgery    . Carotid doppler  11/04/2012    Proximal Rt ICA 50-99% diameter reduction; Lft Bulb demonstrated mild amount homogeneous plaque-not hemodynamically significant; Lft ICA-normal patency.  . Pacemaker generator change  01/31/2012    St Jude Med [employer] DR RF model [id number] serial [phone]  . Cardiac catheterization  01/06/2003    Recommend medical therapy  . Cardiovascular stress test  07/16/2012    Mild-moderate perfusion defect seen in Basal inferior, Mid inferior, and Apica lateral consistent with infarct/scar. No scintigraphic evidence for inducible myocardial ischemia. No ECG changes. EKG negative for ischemia.  . Transthoracic  echocardiogram  11/04/2012    EF [id number], systolic function moderately reduced, mild regurg of the aortic and mitral valves.  . Permanent pacemaker generator change N/A 01/31/2012    Procedure: PERMANENT PACEMAKER GENERATOR CHANGE;  Surgeon: Sanda Klein, MD;  Location: [facility]ATH [facility];     Family History  Problem Relation Age of Onset  . Prostate cancer Father   . Colon cancer Neg Hx   . Diabetes Maternal Grandmother   . Heart disease Mother   . Stroke Mother   . Heart attack Neg Hx   Granddaughter - benign thyroid nodule at age 41.  Social History:  reports that he has never smoked. He does not have any smokeless tobacco history on file. He reports that he does not drink alcohol or use illicit drugs.  Allergies:  Allergies  Allergen Reactions  . Adhesive [Tape] Other (See Comments)    Skin irritation - please use paper tape  . Statins Other (See Comments)    Leg cramps.   Tolerates pravastatin.    Marland Kitchen Penicillins Rash    Has patient had a PCN reaction causing immediate rash, facial/tongue/throat swelling, SOB or lightheadedness with hypotension: Yes Has patient had a PCN reaction causing severe rash involving mucus membranes or skin necrosis: No Has patient had a PCN reaction that required hospitalization No Has patient had a PCN reaction occurring within the last 10 years: No If all of the above answers are "NO", then may proceed with Cephalosporin use.  Medications: I have reviewed the patient's current medications.  ROS  General: No weight loss. Neck:  No hoarseness, but clears throat often. Cardiovascular: No palpitations. Respiratory: Mild dyspnea and cough. Gastrointestinal: No diarrhea or loose stools. Neurologic: No tremor, no confusion. Psychiatric: Mild insomnia, no anxiety. Skin: No rash, no jaundice. Hematologic/lymphatic: No abnormal bleeding, no easy bruising.  Blood pressure 129/57, pulse 67, temperature 98.6 F (37 C), temperature source Oral, resp. rate 18,  height 5\' 9"  (1.753 m), weight 99.02 kg (218 lb 4.8 oz), SpO2 95 %. Physical Exam  General: No apparent distress. Eyes: Anicteric, no scleral show, no periorbital puffiness, no lid lag. Neck: Supple, trachea midline. Thyroid: Mostly substernal which limits the thyroid exam, no thyroid bruit. Cardiovascular: Regular rhythm and rate, normal radial pulses, bilateral lower extremity edema. Respiratory: Normal respiratory effort, clear to auscultation. Gastrointestinal: Normal pitch active bowel sounds, nontender abdomen without appreciable hepatomegaly. Neurologic: Cranial nerves normal as tested, biceps and patellar deep tendon reflexes 1+ and symmetric, no tremor. Musculoskeletal: Normal muscle tone, no muscle atrophy. Skin: Mildly excessive warmth, no visible rash. Mental status: Alert, conversant, speech clear, thought logical, appropriate mood and affect, no hallucinations or delusions evident. Hematologic/lymphatic: No cervical adenopathy, no jaundice.    Lab Results  Component Value Date   TSH 0.040* 11/15/2015   TSH 0.034* 11/13/2015   TSH 1.430 05/17/2015   FREET4 >5.50* 11/15/2015   T3FREE 12.6* 11/15/2015   WBC 5.0 11/13/2015   HGB 13.2 11/13/2015   PLT 149* 11/13/2015   NA 137 11/17/2015   K 3.7 11/17/2015   CALCIUM 8.6* 11/17/2015   ALBUMIN 2.7* 11/13/2015    Lab Results  Component Value Date   AST 48* 11/13/2015   ALT 61 11/13/2015   ALKPHOS 95 11/13/2015   NEUTROABS 2.7 11/13/2015   BILITOT 1.8* 11/13/2015    Dg Chest 2 View  11/17/2015  CLINICAL DATA:  Cough EXAM: CHEST  2 VIEW COMPARISON:  11/13/2015 FINDINGS: Left subclavian pacemaker device and leads are stable. Bibasilar hazy airspace disease has improved. Lung volumes have improved. No pneumothorax. Small pleural effusions are suspected. IMPRESSION: Improved bibasilar airspace disease. Small pleural effusions are suspected. Electronically Signed   By: Marybelle Killings M.D.   On: 11/17/2015 14:11    Assessment/Plan: 1.  Amiodarone-associated thyrotoxicosis.  Benjamin Mckay seems to be tolerating his hyperthyroxinemia relatively well.  I reviewed the lab findings, interpretation, and care plan with the patient.  I provided him a printed copy of the relevant lab reports.    Education provided, including handouts from the American Thyroid Association on: Thyroid Function Tests, Hyperthyroidism.    I recommend checking for underlying thyroid nodules (by thyroid ultrasound) or Graves' disease (by thyroid ultrasound and thyroid stimulating immunoglobulin lab test).    Continue beta-blocker therapy as ordered by his cardiologist.  Start methimazole 10 mg by mouth three times a day during hospital stay (anticipate transition to 10 mg tablets three tablets by mouth once a day after hospital discharge).    Informed consent obtained after discussion of anticipated benefits, alternatives, nature of therapy, and risks--including but not limited to rash, bone marrow suppression, liver damage, etc. from methimazole.   If no evidence of underlying thyroid nodules or Graves' disease, then this may represent a transient thyrotoxicosis due to a thyroiditis.  If that is the case, then methimazole might not be effective and prednisone could be considered (if his heart failure can be adequately controlled).    Benjamin Mckay 11/17/2015, 6:13 PM

## 2015-11-17 NOTE — Progress Notes (Signed)
ANTICOAGULATION CONSULT NOTE - Follow Up Consult  Pharmacy Consult for Coumadin Indication: atrial fibrillation  Patient Measurements: Height: 5\' 9"  (175.3 cm) Weight: 218 lb 4.8 oz (99.02 kg) (scale c) IBW/kg (Calculated) : 70.7  Vital Signs: Temp: 98.6 F (37 C) (02/03 1206) Temp Source: Oral (02/03 1206) BP: 129/57 mmHg (02/03 1206) Pulse Rate: 67 (02/03 1206)  Labs:  Recent Labs  11/15/15 0430 11/16/15 0445 11/17/15 0400  LABPROT 28.8* 25.6* 24.0*  INR 2.76* 2.36* 2.17*  CREATININE 1.13 1.27*  --     Estimated Creatinine Clearance: 63.7 mL/min (by C-G formula based on Cr of 1.27).  Assessment:   INR remains therapeutic (2.17) but trending down. Coumadin held 1/30 and 1/31 when INR >3, then resumed usual regimen on 2/1.   Home regimen: 2.5 mg daily except 1.25 mg on Mondays and Fridays.   Last several outpatient INRs have been therapeutic.  Goal of Therapy:  INR 2-3 Monitor platelets by anticoagulation protocol: Yes   Plan:   Coumadin 2.5 mg again today, instead of usual Friday dose of 1.25 mg.  Discussed with patient.  Continue daily PT/INR for now.  Arty Baumgartner, [place] Pager: [phone] 11/17/2015,12:23 PM

## 2015-11-17 NOTE — Progress Notes (Signed)
Patient Profile: 25M with ischemic cardiomyopathy LVEF 30-35%, CAD, permanent atrial fibrillation, CHB s/p ICD, and NSVT here with acute on chronic systolic heart failure.   Subjective: Feeling well.  Continues to have non-productive cough.   Objective: Vital signs in last 24 hours: Temp:  [98 F (36.7 C)-98.4 F (36.9 C)] 98.4 F (36.9 C) (02/03 0559) Pulse Rate:  [68-73] 71 (02/03 0937) Resp:  [18] 18 (02/03 0559) BP: (98-127)/(50-59) 98/58 mmHg (02/03 0937) SpO2:  [93 %-98 %] 93 % (02/03 0559) Weight:  [99.02 kg (218 lb 4.8 oz)] 99.02 kg (218 lb 4.8 oz) (02/03 0406) Last BM Date: 11/13/14  Intake/Output from previous day: 02/02 0701 - 02/03 0700 In: 27 [P.O.:780] Out: 4875 [Urine:4875] Intake/Output this shift: Total I/O In: 240 [P.O.:240] Out: 900 [Urine:900]  Medications Current Facility-Administered Medications  Medication Dose Route Frequency Provider Last Rate Last Dose  . 0.9 %  sodium chloride infusion  250 mL Intravenous PRN Brett Canales, PA-C 10 mL/hr at 11/15/15 1257 250 mL at 11/15/15 1257  . acetaminophen (TYLENOL) tablet 650 mg  650 mg Oral Q4H PRN Brett Canales, PA-C      . amiodarone (PACERONE) tablet 200 mg  200 mg Oral Daily Brittainy Erie Noe, PA-C   200 mg at 11/17/15 [id number]  . aspirin EC tablet 81 mg  81 mg Oral Daily Brett Canales, PA-C   81 mg at 11/17/15 [id number]  . benzonatate (TESSALON) capsule 200 mg  200 mg Oral TID PRN Skeet Latch, MD   200 mg at 11/16/15 2139  . furosemide (LASIX) 250 mg in dextrose 5 % 250 mL (1 mg/mL) infusion  8 mg/hr Intravenous Continuous Skeet Latch, MD 8 mL/hr at 11/15/15 1257 8 mg/hr at 11/15/15 1257  . guaiFENesin-codeine 100-10 MG/5ML solution 5 mL  5 mL Oral Q6H PRN Eileen Stanford, PA-C   5 mL at 11/17/15 0956  . losartan (COZAAR) tablet 25 mg  25 mg Oral Daily Brett Canales, PA-C   25 mg at 11/17/15 [id number]  . metoprolol succinate (TOPROL-XL) 24 hr tablet 25 mg  25 mg Oral Daily Brett Canales, PA-C   25  mg at 11/17/15 [id number]  . ondansetron (ZOFRAN) injection 4 mg  4 mg Intravenous Q6H PRN Brett Canales, PA-C      . potassium chloride SA (K-DUR,KLOR-CON) CR tablet 20 mEq  20 mEq Oral BID Brett Canales, PA-C   20 mEq at 11/17/15 0935  . pravastatin (PRAVACHOL) tablet 80 mg  80 mg Oral QHS Brett Canales, PA-C   80 mg at 11/16/15 2139  . sodium chloride flush (NS) 0.9 % injection 3 mL  3 mL Intravenous Q12H Brett Canales, PA-C   3 mL at 11/17/15 [id number]  . sodium chloride flush (NS) 0.9 % injection 3 mL  3 mL Intravenous PRN Brett Canales, PA-C      . spironolactone (ALDACTONE) tablet 12.5 mg  12.5 mg Oral Daily Skeet Latch, MD   12.5 mg at 11/17/15 0936  . tamsulosin (FLOMAX) capsule 0.4 mg  0.4 mg Oral QHS Brett Canales, PA-C   0.4 mg at 11/16/15 2139  . Warfarin - Pharmacist Dosing Inpatient   Does not apply q1800 Lavenia Atlas, [place] at 11/13/15 1818    PE: Gen: Well-appearing.  Neck: JVD at mid ear at 45 degrees COR: RRR. II/VI systolic murmur at LUSB. No r/g  Lungs: Diminished at right base.  Abd: Soft, NT,  ND.  Ext 2+ pitting edema to the upper thighs bilaterally  Lab Results:  No results for input(s): WBC, HGB, HCT, PLT in the last 72 hours. BMET  Recent Labs  11/15/15 0430 11/16/15 0445  NA 139 137  K 4.1 3.7  CL 101 98*  CO2 26 27  GLUCOSE 98 101*  BUN 19 17  CREATININE 1.13 1.27*  CALCIUM 8.5* 8.4*   PT/INR  Recent Labs  11/15/15 0430 11/16/15 0445 11/17/15 0400  LABPROT 28.8* 25.6* 24.0*  INR 2.76* 2.36* 2.17*   I/O last 3 completed shifts: In: 1000 [P.O.:1000] Out: 7000 [Urine:7000] Total I/O In: 240 [P.O.:240] Out: 900 [Urine:900]  Filed Weights   11/15/15 0544 11/16/15 0648 11/17/15 0406  Weight: 104.101 kg (229 lb 8 oz) 100.88 kg (222 lb 6.4 oz) 99.02 kg (218 lb 4.8 oz)     Assessment/Plan  Active Problems:   HLD   HTN (hypertension)   Chronic atrial fibrillation ([place])   Long term current use of anticoagulant  therapy   CHB (complete heart block) ([facility])   Single chamber St. Jude pacemaker 2013   Cardiomyopathy, ischemic   Acute on chronic systolic heart failure ([place])   Hyperthyroidism   1. Acute on chronic systolic heart failure:  He remains volume overloaded. Diuresis well at current rate on lasix drip.  Weight is down 4lb from yesterday.  Continue diuresis with lasix drip. Continue strict I/Os, daily weights and low sodium diet.  Daily BMPs to monitor renal function and K.  Continue losartan, metoprolol and spironolactone.   2. Chronic atrial fibrillation: Rate is controlled. Continue metoprolol and warfarin per pharmacy.   3. NSVT: He has been on Amiodarone but now with abnormal thyroid function test, concerning for amiodarone induced hyperthyroidism. He has a h/o of sustained symptomatic ventricular tachycardia, for which amiodarone has been used.   EP was consulted and recommended that he continue amiodarone with treatment for his hyperthyroidism.  Will consult Endocrinology.  4. Hypertension: Blood pressure well-controlled. Continue losartan and metoprolol.  5. Hyperlipidemia: Continue pravastatin.  6. CAD: Not an active issue. Patient denies chest pain. Continue aspirin, statin and beta blocker.   7. Amiodarone-induced hyperthyroidism: Initial TSH low at 0.034. Repeat TSH also abnormal at 0.040. Free T3/T4 are elevated c/w hyperthyroidism. T3 12.6 and T4 >5.50. Will need endocrine consult for treatment of hyperthyroidism, as per EP, there are no alternatives to amiodarone.     LOS: 4 days    Sharaya Boruff C. Oval Linsey, MD, [facility]   11/17/2015 12:05 PM

## 2015-11-18 ENCOUNTER — Inpatient Hospital Stay (HOSPITAL_COMMUNITY): Payer: PPO

## 2015-11-18 DIAGNOSIS — E785 Hyperlipidemia, unspecified: Secondary | ICD-10-CM | POA: Diagnosis not present

## 2015-11-18 DIAGNOSIS — E0580 Other thyrotoxicosis without thyrotoxic crisis or storm: Secondary | ICD-10-CM | POA: Diagnosis not present

## 2015-11-18 DIAGNOSIS — I255 Ischemic cardiomyopathy: Secondary | ICD-10-CM | POA: Diagnosis not present

## 2015-11-18 DIAGNOSIS — E0590 Thyrotoxicosis, unspecified without thyrotoxic crisis or storm: Secondary | ICD-10-CM | POA: Diagnosis not present

## 2015-11-18 DIAGNOSIS — E032 Hypothyroidism due to medicaments and other exogenous substances: Secondary | ICD-10-CM | POA: Diagnosis not present

## 2015-11-18 DIAGNOSIS — I2510 Atherosclerotic heart disease of native coronary artery without angina pectoris: Secondary | ICD-10-CM

## 2015-11-18 DIAGNOSIS — I482 Chronic atrial fibrillation: Secondary | ICD-10-CM | POA: Diagnosis not present

## 2015-11-18 DIAGNOSIS — I10 Essential (primary) hypertension: Secondary | ICD-10-CM | POA: Diagnosis not present

## 2015-11-18 DIAGNOSIS — Z5181 Encounter for therapeutic drug level monitoring: Secondary | ICD-10-CM | POA: Diagnosis not present

## 2015-11-18 DIAGNOSIS — I5023 Acute on chronic systolic (congestive) heart failure: Secondary | ICD-10-CM | POA: Diagnosis not present

## 2015-11-18 DIAGNOSIS — I442 Atrioventricular block, complete: Secondary | ICD-10-CM | POA: Diagnosis not present

## 2015-11-18 LAB — BASIC METABOLIC PANEL
Anion gap: 8 (ref 5–15)
BUN: 21 mg/dL — AB (ref 6–20)
CHLORIDE: 96 mmol/L — AB (ref 101–111)
CO2: 32 mmol/L (ref 22–32)
CREATININE: 1.48 mg/dL — AB (ref 0.61–1.24)
Calcium: 8.6 mg/dL — ABNORMAL LOW (ref 8.9–10.3)
GFR calc Af Amer: 54 mL/min — ABNORMAL LOW (ref >60)
GFR calc non Af Amer: 47 mL/min — ABNORMAL LOW (ref >60)
GLUCOSE: 114 mg/dL — AB (ref 65–99)
Potassium: 4 mmol/L (ref 3.5–5.1)
Sodium: 136 mmol/L (ref 135–145)

## 2015-11-18 LAB — PROTIME-INR
INR: 2.21 — ABNORMAL HIGH (ref 0.00–1.49)
Prothrombin Time: 24.4 seconds — ABNORMAL HIGH (ref 11.6–15.2)

## 2015-11-18 LAB — T4, FREE: Free T4: >5.5 ng/dL — ABNORMAL HIGH (ref 0.61–1.12)

## 2015-11-18 LAB — TSH: TSH: 0.047 u[IU]/mL — ABNORMAL LOW (ref 0.350–4.500)

## 2015-11-18 MED ORDER — WARFARIN SODIUM 2.5 MG PO TABS
2.5000 mg | ORAL_TABLET | Freq: Once | ORAL | Status: AC
  Administered 2015-11-18: 2.5 mg via ORAL
  Filled 2015-11-18: qty 1

## 2015-11-18 MED ORDER — PANTOPRAZOLE SODIUM 40 MG PO TBEC
40.0000 mg | DELAYED_RELEASE_TABLET | Freq: Every day | ORAL | Status: DC
  Administered 2015-11-18 – 2015-11-24 (×7): 40 mg via ORAL
  Filled 2015-11-18 (×7): qty 1

## 2015-11-18 NOTE — Progress Notes (Signed)
ANTICOAGULATION CONSULT NOTE - Follow Up Consult  Pharmacy Consult for Coumadin Indication: atrial fibrillation  Patient Measurements: Height: 5\' 9"  (175.3 cm) Weight: 215 lb 1.6 oz (97.569 kg) (Scale C) IBW/kg (Calculated) : 70.7  Vital Signs: Temp: 98.2 F (36.8 C) (02/04 1202) Temp Source: Oral (02/04 1202) BP: 125/55 mmHg (02/04 1202) Pulse Rate: 69 (02/04 1202)  Labs:  Recent Labs  11/16/15 0445 11/17/15 0400 11/17/15 1429 11/18/15 0433  LABPROT 25.6* 24.0*  --  24.4*  INR 2.36* 2.17*  --  2.21*  CREATININE 1.27*  --  1.40* 1.48*    Estimated Creatinine Clearance: 54.3 mL/min (by C-G formula based on Cr of 1.48).  Assessment: 70 y/o male on chronic Coumadin for Afib. He is on chronic amiodarone and is being treated with methimazole for amiodarone induced hyperthyroidism. As the hyperthyroidism is treated, his weekly Coumadin regimen may need to be increased. Will need to watch over the next several weeks. No bleeding noted, last CBC 1/30.  Home regimen: 2.5 mg daily except 1.25 mg on Mondays and Fridays.  Goal of Therapy:  INR 2-3 Monitor platelets by anticoagulation protocol: Yes   Plan:   Coumadin 2.5 mg PO today  Daily PT/INR   Monitor for s/sx bleeding  [facility], Pharm.D., BCPS Clinical Pharmacist Pager: [phone] 11/18/2015 1:55 PM

## 2015-11-18 NOTE — Progress Notes (Signed)
Patient Profile: 52M with ischemic cardiomyopathy LVEF 30-35%, CAD, permanent atrial fibrillation, CHB s/p ICD, and NSVT here with acute on chronic systolic heart failure.   Subjective: Feeling well.  Continues to have non-productive cough.   Objective: Vital signs in last 24 hours: Temp:  [97.5 F (36.4 C)-98.6 F (37 C)] 98.2 F (36.8 C) (02/04 0841) Pulse Rate:  [64-68] 67 (02/04 0841) Resp:  [17-19] 18 (02/04 0841) BP: (100-129)/(43-62) 100/43 mmHg (02/04 0841) SpO2:  [92 %-98 %] 92 % (02/04 0841) Weight:  [97.569 kg (215 lb 1.6 oz)] 97.569 kg (215 lb 1.6 oz) (02/04 0521) Last BM Date: 11/13/14  Intake/Output from previous day: 02/03 0701 - 02/04 0700 In: [id number] [P.O.:1160; I.V.:96] Out: 3275 [Urine:3275] Intake/Output this shift: Total I/O In: 240 [P.O.:240] Out: 900 [Urine:900]  Medications Current Facility-Administered Medications  Medication Dose Route Frequency Provider Last Rate Last Dose  . 0.9 %  sodium chloride infusion  250 mL Intravenous PRN Brett Canales, PA-C 10 mL/hr at 11/15/15 1257 250 mL at 11/15/15 1257  . acetaminophen (TYLENOL) tablet 650 mg  650 mg Oral Q4H PRN Brett Canales, PA-C      . amiodarone (PACERONE) tablet 200 mg  200 mg Oral Daily Brittainy Erie Noe, PA-C   200 mg at 11/17/15 [id number]  . aspirin EC tablet 81 mg  81 mg Oral Daily Brett Canales, PA-C   81 mg at 11/17/15 [id number]  . benzonatate (TESSALON) capsule 200 mg  200 mg Oral TID PRN Skeet Latch, MD   200 mg at 11/17/15 2140  . furosemide (LASIX) 250 mg in dextrose 5 % 250 mL (1 mg/mL) infusion  8 mg/hr Intravenous Continuous Skeet Latch, MD 8 mL/hr at 11/17/15 2135 8 mg/hr at 11/17/15 2135  . guaiFENesin-codeine 100-10 MG/5ML solution 10 mL  10 mL Oral Q6H PRN Skeet Latch, MD   10 mL at 11/18/15 0350  . losartan (COZAAR) tablet 25 mg  25 mg Oral Daily Brett Canales, PA-C   25 mg at 11/17/15 [id number]  . methimazole (TAPAZOLE) tablet 10 mg  10 mg Oral TID Delrae Rend, MD   10 mg  at 11/17/15 2135  . metoprolol succinate (TOPROL-XL) 24 hr tablet 25 mg  25 mg Oral Daily Brett Canales, PA-C   25 mg at 11/17/15 [id number]  . ondansetron (ZOFRAN) injection 4 mg  4 mg Intravenous Q6H PRN Brett Canales, PA-C      . potassium chloride SA (K-DUR,KLOR-CON) CR tablet 20 mEq  20 mEq Oral BID Brett Canales, PA-C   20 mEq at 11/17/15 2134  . pravastatin (PRAVACHOL) tablet 80 mg  80 mg Oral QHS Brett Canales, PA-C   80 mg at 11/17/15 2135  . sodium chloride flush (NS) 0.9 % injection 3 mL  3 mL Intravenous Q12H Brett Canales, PA-C   3 mL at 11/17/15 [id number]  . sodium chloride flush (NS) 0.9 % injection 3 mL  3 mL Intravenous PRN Brett Canales, PA-C      . spironolactone (ALDACTONE) tablet 12.5 mg  12.5 mg Oral Daily Skeet Latch, MD   12.5 mg at 11/17/15 0936  . tamsulosin (FLOMAX) capsule 0.4 mg  0.4 mg Oral QHS Brett Canales, PA-C   0.4 mg at 11/17/15 2135  . Warfarin - Pharmacist Dosing Inpatient   Does not apply q1800 Lavenia Atlas, [place] at 11/13/15 1818    PE: Gen: Well-appearing.  Neck: JVD 2 cm above  clavicle at 45 degrees COR: RRR. II/VI systolic murmur at LUSB. No r/g  Lungs: CTAB.  No crackles, rhonchi or wheezes  Abd: Soft, NT, ND.  Ext: 2+ pitting edema to the upper knees bilaterally  Lab Results:  No results for input(s): WBC, HGB, HCT, PLT in the last 72 hours. BMET  Recent Labs  11/16/15 0445 11/17/15 1429 11/18/15 0433  NA 137 137 136  K 3.7 3.7 4.0  CL 98* 96* 96*  CO2 27 32 32  GLUCOSE 101* 116* 114*  BUN 17 20 21*  CREATININE 1.27* 1.40* 1.48*  CALCIUM 8.4* 8.6* 8.6*   PT/INR  Recent Labs  11/16/15 0445 11/17/15 0400 11/18/15 0433  LABPROT 25.6* 24.0* 24.4*  INR 2.36* 2.17* 2.21*   I/O last 3 completed shifts: In: [id number] [P.O.:1160; I.V.:96] Out: 5375 [Urine:5375] Total I/O In: 240 [P.O.:240] Out: 900 [Urine:900]  Filed Weights   11/16/15 0648 11/17/15 0406 11/18/15 0521  Weight: 100.88 kg (222 lb 6.4 oz) 99.02 kg  (218 lb 4.8 oz) 97.569 kg (215 lb 1.6 oz)   Telemetry:  Ventricular pacing.  No events.  Assessment/Plan  Active Problems:   HLD   HTN (hypertension)   Chronic atrial fibrillation ([place])   Long term current use of anticoagulant therapy   CHB (complete heart block) (HCC)   Single chamber St. Jude pacemaker 2013   Cardiomyopathy, ischemic   Acute on chronic systolic heart failure ([place])   Hyperthyroidism   1. Acute on chronic systolic heart failure:  Volume status improving daily.  He was only 2L negative yesterday but weight decreased by 3lb.  Renal function slightly worse today.  Continue diuresis with lasix drip. Continue strict I/Os, daily weights and low sodium diet.  Daily BMPs to monitor renal function and K.  Continue losartan, metoprolol and spironolactone.   2. Chronic atrial fibrillation: Rate is controlled. Continue metoprolol and warfarin per pharmacy.   3. NSVT: He has been on Amiodarone but now with abnormal thyroid function test, concerning for amiodarone induced hyperthyroidism. He has a h/o of sustained symptomatic ventricular tachycardia, for which amiodarone has been used.   EP was consulted and recommended that he continue amiodarone with treatment for his hyperthyroidism.    4. Hypertension: Blood pressure well-controlled. Continue losartan and metoprolol.  May need to hold losartan if renal function worsens.  5. Hyperlipidemia: Continue pravastatin.  6. CAD: Not an active issue. Patient denies chest pain. Continue aspirin, statin and beta blocker.   7. Amiodarone-induced hyperthyroidism: Initial TSH low at 0.034. Repeat TSH also abnormal at 0.040. Free T3/T4 are elevated c/w hyperthyroidism. T3 12.6 and T4 >5.50.   Benjamin Mckay was seen by Dr. Buddy Duty yesterday and started on methimazole.  A thyroid ultrasound and thyroid stimulating immunoglobulin were also ordered.   Appreciate his consulation and recommendations.   LOS: 5 days    Benjamin Sahagian C. Oval Linsey, MD, [facility]     11/18/2015 10:01 AM

## 2015-11-18 NOTE — Progress Notes (Signed)
Subjective: Benjamin Mckay feels better today.  He notes that the edema in his legs has improved.  He has a nonproductive cough.  He reports losing 3 pounds of body weight since yesterday, with the help of his diuretics and other therapies.  Social history:  Benjamin Mckay is accompanied now by his son and a grandson.  Family medical history:   69 year-of-age granddaughter had symptomatic hyperthyroidism from a toxic thyroid nodule diagnosed by pediatric endocrinologist, Dr. Tillman Sers; resolved after hemithyroidectomy by endocrine surgeon, Dr. Coralee Rud a small benign-appearing thyroid nodule was noted in the remaining thyroid lobe.  Review of systems: No diarrhea or loose stools, but constipation.   No palpitations. Occasional trouble swallowing.    Objective: Vital signs in last 24 hours: Temp:  [97.5 F (36.4 C)-98.6 F (37 C)] 98.2 F (36.8 C) (02/04 0841) Pulse Rate:  [64-68] 68 (02/04 0958) Resp:  [17-19] 18 (02/04 0841) BP: (100-129)/(42-62) 102/42 mmHg (02/04 0958) SpO2:  [92 %-98 %] 92 % (02/04 0841) Weight:  [97.569 kg (215 lb 1.6 oz)] 97.569 kg (215 lb 1.6 oz) (02/04 0521) Weight change: -1.452 kg (-3 lb 3.2 oz) Last BM Date: 11/13/14  Intake/Output from previous day: 02/03 0701 - 02/04 0700 In: [id number] [P.O.:1160; I.V.:96] Out: 3275 [Urine:3275] Intake/Output this shift: Total I/O In: 240 [P.O.:240] Out: 900 [Urine:900]  General: No apparent distress. Eyes: Anicteric, no proptosis. Neck: Supple, trachea midline. Thyroid: mostly substernal which limits the thyroid exam Cardiovascular: Regular rhythm and rate, normal radial pulses, less edema in legs than during initial visit. Respiratory: Normal respiratory effort, clear to auscultation. Gastrointestinal: Nontender abdomen without appreciable hepatomegaly. Neurologic: Cranial nerves normal as tested, deep tendon reflexes 1+, no tremor. Musculoskeletal: Normal muscle tone, no muscle atrophy. Skin: Mildly  excessive warmth, no visible rash, no onycholysis. Mental status: Alert, conversant, speech clear, thought logical, appropriate mood and affect, no hallucinations or delusions evident. Hematologic/lymphatic: No cervical adenopathy, no jaundice.  Lab Results: Lab Results  Component Value Date   TSH 0.047* 11/18/2015   TSH 0.040* 11/15/2015   TSH 0.034* 11/13/2015   TSH 1.430 05/17/2015   FREET4 >5.50* 11/18/2015   FREET4 >5.50* 11/15/2015   WBC 5.0 11/13/2015   HGB 13.2 11/13/2015   NEUTROABS 2.7 11/13/2015   PLT 149* 11/13/2015   ALKPHOS 95 11/13/2015   ALT 61 11/13/2015   AST 48* 11/13/2015   BILITOT 1.8* 11/13/2015   Medications: I have reviewed the patient's current medications.  Assessment/Plan: 1.  Amiodarone-associated thyrotoxicosis. 2.  Medication monitoring.  Today I reviewed the lab findings and care plan in detail with the patient, his son, and Dr. Skeet Latch.    Informed consent obtained after discussion of anticipated benefits, alternatives, nature of therapy, and risks--including but not limited to rash, bone marrow suppression, liver damage, etc. from methimazole.    Continue beta-blocker therapy, as ordered by cardiologist.  Continue methimazole 30 mg by mouth per day--divided three times a day during hospital stay, but change to three 10 mg tablets once a day as an outpatient.    The patient was instructed to call [phone] to schedule the following visits at [place] at [facility], [facility], [address], [place], [place]: 1.  Lab visit in one week for CBC with differential and hepatic function panel. 2.  Lab visit in 3 weeks for TSH and free T4 level. 3.  Office visit with me (Dr. Buddy Duty) in 3 or 4 weeks.  The thyroid ultrasound can be obtained as  an outpatient, if he is ready to return home before the thyroid ultrasound is obtained.  Note:  Thyroid hormone increases metabolism of clotting factors.   Thyroid hormone also increases metabolism of warfarin.  Consider close monitoring of INR during warfarin therapy when thyroid hormone levels are rising or falling.    Discharge planning at discretion of cardiologist.   LOS: 5 days   Eward Rutigliano 11/18/2015, 10:39 AM

## 2015-11-19 DIAGNOSIS — I482 Chronic atrial fibrillation: Secondary | ICD-10-CM | POA: Diagnosis not present

## 2015-11-19 DIAGNOSIS — I255 Ischemic cardiomyopathy: Secondary | ICD-10-CM | POA: Diagnosis not present

## 2015-11-19 DIAGNOSIS — I442 Atrioventricular block, complete: Secondary | ICD-10-CM | POA: Diagnosis not present

## 2015-11-19 DIAGNOSIS — I5023 Acute on chronic systolic (congestive) heart failure: Secondary | ICD-10-CM | POA: Diagnosis not present

## 2015-11-19 DIAGNOSIS — E0590 Thyrotoxicosis, unspecified without thyrotoxic crisis or storm: Secondary | ICD-10-CM | POA: Diagnosis not present

## 2015-11-19 DIAGNOSIS — I10 Essential (primary) hypertension: Secondary | ICD-10-CM | POA: Diagnosis not present

## 2015-11-19 DIAGNOSIS — E785 Hyperlipidemia, unspecified: Secondary | ICD-10-CM | POA: Diagnosis not present

## 2015-11-19 LAB — PROTIME-INR
INR: 2.52 — AB (ref 0.00–1.49)
PROTHROMBIN TIME: 26.9 s — AB (ref 11.6–15.2)

## 2015-11-19 LAB — BASIC METABOLIC PANEL
Anion gap: 9 (ref 5–15)
BUN: 28 mg/dL — AB (ref 6–20)
CALCIUM: 8.2 mg/dL — AB (ref 8.9–10.3)
CO2: 30 mmol/L (ref 22–32)
CREATININE: 1.67 mg/dL — AB (ref 0.61–1.24)
Chloride: 94 mmol/L — ABNORMAL LOW (ref 101–111)
GFR calc Af Amer: 47 mL/min — ABNORMAL LOW (ref >60)
GFR, EST NON AFRICAN AMERICAN: 40 mL/min — AB (ref >60)
GLUCOSE: 108 mg/dL — AB (ref 65–99)
Potassium: 4.1 mmol/L (ref 3.5–5.1)
Sodium: 133 mmol/L — ABNORMAL LOW (ref 135–145)

## 2015-11-19 LAB — CBC WITH DIFFERENTIAL/PLATELET
BASOS ABS: 0 10*3/uL (ref 0.0–0.1)
BASOS PCT: 0 %
EOS ABS: 0.2 10*3/uL (ref 0.0–0.7)
EOS PCT: 2 %
HEMATOCRIT: 36 % — AB (ref 39.0–52.0)
Hemoglobin: 12.3 g/dL — ABNORMAL LOW (ref 13.0–17.0)
Lymphocytes Relative: 14 %
Lymphs Abs: 1 10*3/uL (ref 0.7–4.0)
MCH: 32.4 pg (ref 26.0–34.0)
MCHC: 34.2 g/dL (ref 30.0–36.0)
MCV: 94.7 fL (ref 78.0–100.0)
MONO ABS: 1 10*3/uL (ref 0.1–1.0)
MONOS PCT: 14 %
NEUTROS ABS: 5.1 10*3/uL (ref 1.7–7.7)
Neutrophils Relative %: 70 %
PLATELETS: 138 10*3/uL — AB (ref 150–400)
RBC: 3.8 MIL/uL — ABNORMAL LOW (ref 4.22–5.81)
RDW: 13.1 % (ref 11.5–15.5)
WBC: 7.4 10*3/uL (ref 4.0–10.5)

## 2015-11-19 LAB — THYROGLOBULIN ANTIBODY: THYROGLOBULIN ANTIBODY: 35.2 [IU]/mL — AB (ref 0.0–0.9)

## 2015-11-19 MED ORDER — AZITHROMYCIN 250 MG PO TABS
250.0000 mg | ORAL_TABLET | Freq: Every day | ORAL | Status: AC
  Administered 2015-11-20 – 2015-11-23 (×4): 250 mg via ORAL
  Filled 2015-11-19 (×4): qty 1

## 2015-11-19 MED ORDER — AZITHROMYCIN 500 MG PO TABS
500.0000 mg | ORAL_TABLET | Freq: Every day | ORAL | Status: AC
  Administered 2015-11-19: 500 mg via ORAL
  Filled 2015-11-19: qty 1

## 2015-11-19 MED ORDER — WARFARIN SODIUM 2.5 MG PO TABS
2.5000 mg | ORAL_TABLET | Freq: Once | ORAL | Status: AC
  Administered 2015-11-19: 2.5 mg via ORAL
  Filled 2015-11-19: qty 1

## 2015-11-19 NOTE — Progress Notes (Addendum)
Patient Profile: 55M with ischemic cardiomyopathy LVEF 30-35%, CAD, permanent atrial fibrillation, CHB s/p ICD, and NSVT here with acute on chronic systolic heart failure.   Subjective: Feeling well.  Continues to have non-productive cough.   Objective: Vital signs in last 24 hours: Temp:  [98 F (36.7 C)-99.9 F (37.7 C)] 99.8 F (37.7 C) (02/05 1035) Pulse Rate:  [65-69] 67 (02/05 0407) Resp:  [18] 18 (02/05 1035) BP: (105-129)/(40-55) 105/40 mmHg (02/05 1035) SpO2:  [95 %-99 %] 95 % (02/05 1035) Weight:  [97.433 kg (214 lb 12.8 oz)] 97.433 kg (214 lb 12.8 oz) (02/05 0407) Last BM Date: 11/13/14  Intake/Output from previous day: 02/04 0701 - 02/05 0700 In: 1200 [P.O.:1200] Out: 2250 [Urine:2250] Intake/Output this shift: Total I/O In: 360 [P.O.:360] Out: 350 [Urine:350]  Medications Current Facility-Administered Medications  Medication Dose Route Frequency Provider Last Rate Last Dose  . 0.9 %  sodium chloride infusion  250 mL Intravenous PRN Brett Canales, PA-C 10 mL/hr at 11/15/15 1257 250 mL at 11/15/15 1257  . acetaminophen (TYLENOL) tablet 650 mg  650 mg Oral Q4H PRN Brett Canales, PA-C      . amiodarone (PACERONE) tablet 200 mg  200 mg Oral Daily Brittainy Erie Noe, PA-C   200 mg at 11/18/15 0959  . aspirin EC tablet 81 mg  81 mg Oral Daily Brett Canales, PA-C   81 mg at 11/18/15 [id number]  . benzonatate (TESSALON) capsule 200 mg  200 mg Oral TID PRN Skeet Latch, MD   200 mg at 11/19/15 0825  . furosemide (LASIX) 250 mg in dextrose 5 % 250 mL (1 mg/mL) infusion  8 mg/hr Intravenous Continuous Skeet Latch, MD 8 mL/hr at 11/17/15 2135 8 mg/hr at 11/17/15 2135  . guaiFENesin-codeine 100-10 MG/5ML solution 10 mL  10 mL Oral Q6H PRN Skeet Latch, MD   10 mL at 11/19/15 0354  . methimazole (TAPAZOLE) tablet 10 mg  10 mg Oral TID Delrae Rend, MD   10 mg at 11/18/15 2116  . metoprolol succinate (TOPROL-XL) 24 hr tablet 25 mg  25 mg Oral Daily Brett Canales,  PA-C   25 mg at 11/18/15 0959  . ondansetron (ZOFRAN) injection 4 mg  4 mg Intravenous Q6H PRN Brett Canales, PA-C      . pantoprazole (PROTONIX) EC tablet 40 mg  40 mg Oral Daily Skeet Latch, MD   40 mg at 11/18/15 1547  . potassium chloride SA (K-DUR,KLOR-CON) CR tablet 20 mEq  20 mEq Oral BID Brett Canales, PA-C   20 mEq at 11/18/15 2116  . pravastatin (PRAVACHOL) tablet 80 mg  80 mg Oral QHS Brett Canales, PA-C   80 mg at 11/18/15 2116  . sodium chloride flush (NS) 0.9 % injection 3 mL  3 mL Intravenous Q12H Brett Canales, PA-C   3 mL at 11/18/15 1004  . sodium chloride flush (NS) 0.9 % injection 3 mL  3 mL Intravenous PRN Brett Canales, PA-C      . spironolactone (ALDACTONE) tablet 12.5 mg  12.5 mg Oral Daily Skeet Latch, MD   12.5 mg at 11/18/15 0959  . tamsulosin (FLOMAX) capsule 0.4 mg  0.4 mg Oral QHS Brett Canales, PA-C   0.4 mg at 11/18/15 2116  . Warfarin - Pharmacist Dosing Inpatient   Does not apply q1800 Lavenia Atlas, [facility]        PE: Gen: Well-appearing.  Neck: JVD 1 cm above clavicle at 45  degrees COR: RRR. II/VI systolic murmur at LUSB. No r/g  Lungs: CTAB.  No crackles, rhonchi or wheezes  Abd: Soft, NT, ND.  Ext: 1+ pitting edema to the anterior tibia bilaterally  Lab Results:  No results for input(s): WBC, HGB, HCT, PLT in the last 72 hours. BMET  Recent Labs  11/17/15 1429 11/18/15 0433 11/19/15 0520  NA 137 136 133*  K 3.7 4.0 4.1  CL 96* 96* 94*  CO2 32 32 30  GLUCOSE 116* 114* 108*  BUN 20 21* 28*  CREATININE 1.40* 1.48* 1.67*  CALCIUM 8.6* 8.6* 8.2*   PT/INR  Recent Labs  11/17/15 0400 11/18/15 0433 11/19/15 0520  LABPROT 24.0* 24.4* 26.9*  INR 2.17* 2.21* 2.52*   I/O last 3 completed shifts: In: 1768 [P.O.:1680; I.V.:88] Out: 2950 [Urine:2950] Total I/O In: 360 [P.O.:360] Out: 350 [Urine:350]  Filed Weights   11/17/15 0406 11/18/15 0521 11/19/15 0407  Weight: 99.02 kg (218 lb 4.8 oz) 97.569 kg (215 lb 1.6 oz)  97.433 kg (214 lb 12.8 oz)   Telemetry:  Ventricular pacing.  No events.  Assessment/Plan  Active Problems:   HLD   HTN (hypertension)   Chronic atrial fibrillation ([place])   Long term current use of anticoagulant therapy   CHB (complete heart block) (HCC)   Single chamber St. Jude pacemaker 2013   Cardiomyopathy, ischemic   Acute on chronic systolic heart failure ([place])   Hyperthyroidism   1. Acute on chronic systolic heart failure:  Volume status improving daily.  Diuresis has slowed and he was only 1 L negative yesterday. Weight decreased by 1 pound. His baseline weight is 200 pounds, sitting still above his dry weight and is clearly viable overloaded on exam. I suspect that he will need a day to recruit the remaining extravascular fluid into the intravascular space. His renal function is slightly worse today. We will give him a day of rest from the IV diuresis. I have asked him to elevate his legs when sitting. We will also place compression stockings. We will also hold his losartan in order to allow his kidneys to rest.  Continue strict I/Os, daily weights and low sodium diet.  Daily BMPs to monitor renal function and K.  Continue metoprolol.  Hold spironolactone.  Tomorrow we will resume diuresis, likely with bolus dosing of IV Lasix, as long as his renal function improves. He may ultimately do better on torsemide and Lasix.  2. Chronic atrial fibrillation: Rate is controlled. Continue metoprolol and warfarin per pharmacy.   3. NSVT: He has been on Amiodarone but now with abnormal thyroid function test, concerning for amiodarone induced hyperthyroidism. He has a h/o of sustained symptomatic ventricular tachycardia, for which amiodarone has been used.   EP was consulted and recommended that he continue amiodarone with treatment for his hyperthyroidism.    4. Hypertension: Blood pressure well-controlled. Hold losartan due to worsening renal function.  5. Hyperlipidemia: Continue  pravastatin.  6. CAD: Not an active issue. Patient denies chest pain. Continue aspirin, statin and beta blocker.   7. Amiodarone-induced hyperthyroidism: Initial TSH low at 0.034. Repeat TSH also abnormal at 0.040. Free T3/T4 are elevated c/w hyperthyroidism. T3 12.6 and T4 >5.50.  A thyroid ultrasound concerning for thyroiditis versus multinodular goiter. No dominant mass or nodule was noted.  Continue methimazole and metoprolol  8. Cough: Mr. Maines' main complaint is his chronic, nonproductive cough. We've tried multiple antitussives and started a PPI yesterday, which has not improved his symptoms. He denies any  postnasal drip. Chest x-ray is negative for infection. He reports low-grade fevers, though the highest temperature recorded is 99.8. His symptoms have been ongoing for over a week now and have not improved with diuresis. We discussed the fact that may be viral. Think it is reasonable to treat with a Z-Pak, though I'm not particularly hopefu that this will help.  It is unlikely, though possible that his thyroid dysfunction could be related.    LOS: 6 days    Benjamin Mckay C. Oval Linsey, MD, [facility]   11/19/2015 10:38 AM

## 2015-11-19 NOTE — Progress Notes (Signed)
ANTICOAGULATION CONSULT NOTE - Follow Up Consult  Pharmacy Consult for Coumadin Indication: atrial fibrillation  Patient Measurements: Height: 5\' 9"  (175.3 cm) Weight: 214 lb 12.8 oz (97.433 kg) (scale c) IBW/kg (Calculated) : 70.7  Vital Signs: Temp: 99.8 F (37.7 C) (02/05 1035) Temp Source: Oral (02/05 1035) BP: 105/40 mmHg (02/05 1035) Pulse Rate: 67 (02/05 0407)  Labs:  Recent Labs  11/17/15 0400 11/17/15 1429 11/18/15 0433 11/19/15 0520  LABPROT 24.0*  --  24.4* 26.9*  INR 2.17*  --  2.21* 2.52*  CREATININE  --  1.40* 1.48* 1.67*    Estimated Creatinine Clearance: 48.1 mL/min (by C-G formula based on Cr of 1.67).  Assessment: 70 y/o male on chronic Coumadin for Afib. He is on chronic amiodarone and is being treated with methimazole for amiodarone induced hyperthyroidism. As the hyperthyroidism is treated, his weekly Coumadin regimen may need to be increased. Will need to watch over the next several weeks. INR is therapeutic at 2.52. No bleeding noted, last CBC 1/30.  Home regimen: 2.5 mg daily except 1.25 mg on Mondays and Fridays.  Goal of Therapy:  INR 2-3 Monitor platelets by anticoagulation protocol: Yes   Plan:   Coumadin 2.5 mg PO today  Daily PT/INR   CBC weekly, due in am  Monitor for s/sx bleeding  [facility], [place].D., BCPS Clinical Pharmacist Pager: [phone] 11/19/2015 11:26 AM

## 2015-11-20 DIAGNOSIS — Z5181 Encounter for therapeutic drug level monitoring: Secondary | ICD-10-CM | POA: Diagnosis not present

## 2015-11-20 DIAGNOSIS — R05 Cough: Secondary | ICD-10-CM | POA: Diagnosis not present

## 2015-11-20 DIAGNOSIS — E0580 Other thyrotoxicosis without thyrotoxic crisis or storm: Secondary | ICD-10-CM | POA: Diagnosis not present

## 2015-11-20 DIAGNOSIS — R946 Abnormal results of thyroid function studies: Secondary | ICD-10-CM | POA: Diagnosis not present

## 2015-11-20 DIAGNOSIS — E049 Nontoxic goiter, unspecified: Secondary | ICD-10-CM | POA: Diagnosis not present

## 2015-11-20 DIAGNOSIS — I5023 Acute on chronic systolic (congestive) heart failure: Secondary | ICD-10-CM | POA: Diagnosis not present

## 2015-11-20 LAB — BASIC METABOLIC PANEL
Anion gap: 10 (ref 5–15)
Anion gap: 6 (ref 5–15)
BUN: 31 mg/dL — AB (ref 6–20)
BUN: 31 mg/dL — AB (ref 6–20)
CHLORIDE: 96 mmol/L — AB (ref 101–111)
CHLORIDE: 97 mmol/L — AB (ref 101–111)
CO2: 27 mmol/L (ref 22–32)
CO2: 30 mmol/L (ref 22–32)
CREATININE: 1.44 mg/dL — AB (ref 0.61–1.24)
Calcium: 8.4 mg/dL — ABNORMAL LOW (ref 8.9–10.3)
Calcium: 8.4 mg/dL — ABNORMAL LOW (ref 8.9–10.3)
Creatinine, Ser: 1.46 mg/dL — ABNORMAL HIGH (ref 0.61–1.24)
GFR calc Af Amer: 56 mL/min — ABNORMAL LOW (ref >60)
GFR calc non Af Amer: 48 mL/min — ABNORMAL LOW (ref >60)
GFR calc non Af Amer: 47 mL/min — ABNORMAL LOW (ref >60)
GFR, EST AFRICAN AMERICAN: 55 mL/min — AB (ref >60)
GLUCOSE: 153 mg/dL — AB (ref 65–99)
Glucose, Bld: 106 mg/dL — ABNORMAL HIGH (ref 65–99)
POTASSIUM: 4.2 mmol/L (ref 3.5–5.1)
POTASSIUM: 4.1 mmol/L (ref 3.5–5.1)
SODIUM: 133 mmol/L — AB (ref 135–145)
SODIUM: 133 mmol/L — AB (ref 135–145)

## 2015-11-20 LAB — PROTIME-INR
INR: 2.91 — ABNORMAL HIGH (ref 0.00–1.49)
Prothrombin Time: 29.9 seconds — ABNORMAL HIGH (ref 11.6–15.2)

## 2015-11-20 MED ORDER — FUROSEMIDE 10 MG/ML IJ SOLN
80.0000 mg | Freq: Two times a day (BID) | INTRAMUSCULAR | Status: DC
  Administered 2015-11-20 – 2015-11-23 (×8): 80 mg via INTRAVENOUS
  Filled 2015-11-20 (×8): qty 8

## 2015-11-20 MED ORDER — WARFARIN SODIUM 2.5 MG PO TABS
2.5000 mg | ORAL_TABLET | Freq: Once | ORAL | Status: AC
  Administered 2015-11-20: 2.5 mg via ORAL
  Filled 2015-11-20: qty 1

## 2015-11-20 NOTE — Progress Notes (Signed)
Patient Name: [facility] Date of Encounter: 11/20/2015  Primary Cardiologist: Croitoru   Principal Problem:   Acute on chronic systolic heart failure ([place]) Active Problems:   HLD   HTN (hypertension)   Chronic atrial fibrillation ([place])   Long term current use of anticoagulant therapy   CHB (complete heart block) ([place])   Single chamber St. Jude pacemaker 2013   Cardiomyopathy, ischemic   Hyperthyroidism    SUBJECTIVE  Denies obvious discomfort other than continuous cough. Still edematous. Scrotal edema improved.   CURRENT MEDS . amiodarone  200 mg Oral Daily  . aspirin EC  81 mg Oral Daily  . azithromycin  250 mg Oral Daily  . furosemide  80 mg Intravenous BID  . methimazole  10 mg Oral TID  . metoprolol succinate  25 mg Oral Daily  . pantoprazole  40 mg Oral Daily  . potassium chloride  20 mEq Oral BID  . pravastatin  80 mg Oral QHS  . sodium chloride flush  3 mL Intravenous Q12H  . tamsulosin  0.4 mg Oral QHS  . Warfarin - Pharmacist Dosing Inpatient   Does not apply q1800    OBJECTIVE  Filed Vitals:   11/19/15 1300 11/19/15 2031 11/20/15 0427 11/20/15 0939  BP: 94/36 103/84 141/49 125/53  Pulse: 70 66 69 73  Temp: 98.4 F (36.9 C) 98.4 F (36.9 C) 97.8 F (36.6 C)   TempSrc: Oral Oral Oral   Resp: 20 16 16    Height:      Weight:   215 lb 3.2 oz (97.614 kg)   SpO2: 97% 97% 97%     Intake/Output Summary (Last 24 hours) at 11/20/15 0945 Last data filed at 11/20/15 0600  Gross per 24 hour  Intake 1345.87 ml  Output   1375 ml  Net -29.13 ml   Filed Weights   11/18/15 0521 11/19/15 0407 11/20/15 0427  Weight: 215 lb 1.6 oz (97.569 kg) 214 lb 12.8 oz (97.433 kg) 215 lb 3.2 oz (97.614 kg)    PHYSICAL EXAM  General: Pleasant, NAD. Neuro: Alert and oriented X 3. Moves all extremities spontaneously. Psych: Normal affect. HEENT:  Normal  Neck: Supple without bruits. Lungs:  Resp regular and unlabored. Decreased breath sound in R base, otherwise,  CTA with only mild crackle in L base Heart: RRR no s3, s4, or murmurs. Abdomen: Soft, non-tender, non-distended, BS + x 4.  Extremities: No clubbing, cyanosis. DP/PT/Radials 2+ and equal bilaterally. 2+ pitting edema in bilateral LE.   Accessory Clinical Findings  CBC  Recent Labs  11/19/15 1225  WBC 7.4  NEUTROABS 5.1  HGB 12.3*  HCT 36.0*  MCV 94.7  PLT 0000000*   Basic Metabolic Panel  Recent Labs  11/19/15 0520 11/20/15 0410  NA 133* 133*  K 4.1 4.2  CL 94* 96*  CO2 30 27  GLUCOSE 108* 106*  BUN 28* 31*  CREATININE 1.67* 1.44*  CALCIUM 8.2* 8.4*   Thyroid Function Tests  Recent Labs  11/18/15 0433  TSH 0.047*    TELE Paced rhythm    ECG  No new EKG  Echocardiogram 11/15/2015  LV EF: 45% -  50%  ------------------------------------------------------------------- Indications:   CHF - 428.0.  ------------------------------------------------------------------- History:  PMH: Lower Extremity Edema. Cardiomyopathy- Ischemic. Congenital Heart Block. Dyspnea. Risk factors: Hypertension. Dyslipidemia.  ------------------------------------------------------------------- Study Conclusions  - Left ventricle: The cavity size was normal. There was mild concentric hypertrophy. Systolic function was mildly reduced. The estimated ejection fraction was in the range  of 45% to 50%. There is akinesis of the anteroseptal myocardium. Doppler parameters are consistent with high ventricular filling pressure. - Aortic valve: There was mild regurgitation. - Mitral valve: Calcified annulus. - Right ventricle: Pacer wire or catheter noted in right ventricle. - Right atrium: The atrium was mildly dilated. - Pulmonary arteries: Systolic pressure was moderately increased. PA peak pressure: 54 mm Hg (S). - Pericardium, extracardiac: There was a moderate-sized left pleural effusion.     Radiology/Studies  Dg Chest 2 View  11/17/2015  CLINICAL DATA:   Cough EXAM: CHEST  2 VIEW COMPARISON:  11/13/2015 FINDINGS: Left subclavian pacemaker device and leads are stable. Bibasilar hazy airspace disease has improved. Lung volumes have improved. No pneumothorax. Small pleural effusions are suspected. IMPRESSION: Improved bibasilar airspace disease. Small pleural effusions are suspected. Electronically Signed   By: Marybelle Killings M.D.   On: 11/17/2015 14:11   Dg Chest 2 View  11/13/2015  CLINICAL DATA:  Persistent cough since 10/27/2015 EXAM: CHEST  2 VIEW COMPARISON:  Chest x-ray 10/27/2015. FINDINGS: The heart is mildly enlarged but stable. The pacer wires are stable. Bibasilar opacity suspicious for pneumonia with probable small bilateral pleural effusions. The bony thorax is intact. IMPRESSION: Bibasilar infiltrates. Electronically Signed   By: Marijo Sanes M.D.   On: 11/13/2015 11:59   Dg Chest 2 View  10/27/2015  CLINICAL DATA:  Congestive heart failure.  Cough. EXAM: CHEST  2 VIEW COMPARISON:  10/25/2015 FINDINGS: Dual lead pacer noted. There is an abandoned pacer lead extending to the right atrium which is discontinuous superiorly and does not appear attached to the pulse generator. Mild enlargement of the cardiopericardial silhouette noted without edema. Persistent vague density in the right upper lobe is nonspecific but could represent some resolving edema or faint pneumonia. IMPRESSION: 1. Stable appearance of indistinct density in the right upper lobe, possibly from low-level edema or pneumonia. Follow up to clearance is recommended -consider radiography in 4 weeks time. 2. Mild enlargement of the cardiopericardial silhouette. Electronically Signed   By: Van Clines M.D.   On: 10/27/2015 10:25   Dg Chest 2 View  10/25/2015  CLINICAL DATA:  Shortness of breath and cough EXAM: CHEST - 2 VIEW COMPARISON:  12/18/2014 FINDINGS: Cardiac shadow is stable. Pacing device is again seen. Mild central vascular congestion is noted without definitive  interstitial edema. There is some mild increased density noted in the right upper lobe which may represent some very early infiltrate. No sizable effusion is seen. IMPRESSION: Mild vascular congestion. Changes suggestive of early right upper lobe infiltrate. Electronically Signed   By: Inez Catalina M.D.   On: 10/25/2015 10:14   US Soft Tissue Head/neck  11/19/2015  CLINICAL DATA:  Hyperthyroidism due to amiodarone. EXAM: THYROID ULTRASOUND TECHNIQUE: Ultrasound examination of the thyroid gland and adjacent soft tissues was performed. COMPARISON:  None. FINDINGS: Right thyroid lobe Measurements: 6.3 x 3.0 x 2.0 cm. No nodules visualized. Heterogeneous parenchyma is noted. Left thyroid lobe Measurements: 5.5 x 3.1 x 2.4 cm. No nodules visualized. Heterogeneous parenchyma is noted. Isthmus Thickness: 7 mm.  No nodules visualized. Lymphadenopathy None visualized. IMPRESSION: Heterogeneous parenchyma of both thyroid lobes is noted suggesting multinodular goiter or thyroiditis, but no dominant mass or nodule is noted. Electronically Signed   By: Marijo Conception, M.D.   On: 11/19/2015 07:05    ASSESSMENT AND PLAN  1. Acute on chronic systolic HF: with 30 lbs volume overload, likely related to dietary indiscretion  - Echo 11/15/2015 EF 45-50%,  akinesis of anteroseptal myocardium, mild AR, PA peak pressure 49mmHg, moderate L pleural effusion. Note EF improved from previous 30-35% since early 2016.   - I/O - 13L. Weight down from 232 to 215 lbs. Baseline 200 lbs. Given 1 day holiday on 2/5 to allow kidney to rest, lasix gtt discontinued. Plan to resume IV diuresis today at 80mg  BID. Losartan and spironolactone on hold, plan to restart later before discharge. Will order compression stocking. Hopefully with further diuresis, his cough will improve as well. Monitor for sign of infection, Tmax 99.9 yesterday  2. ICM with baseline EF 30-35%, now improved to 45-50%  3. Permanent afib: Continue amiodarone, metoprolol and  warfarin per pharmacy consult  - markedly hyperthyroid free T3 12.6, free T4 > 5.5, TSH 0.04. Likely related to amiodarone toxicity. However amiodarone has also been suppressing his symptomatic NSVT.   - EP consulted on 11/16/2015 recommend continue amiodarone given the benefit, and treat hyperthyroid.  4. CAD: no angina  5. H/o CHB s/p St Jude PPM since 1984, pacemaker dependent   - although he may benefit from upgrading to BiV ICD, however he has multiple previous PPM, at high risk for infection. Decided to manage conservatively. Seen by EP on 11/16/2015, did not recommend lead extraction or insertion of BiV ICD at this point.  6. Hyperthyroidism: likely related to amiodarone. EP consulted on 2/2, recommend treat hyperthyroidism and maintain amio given its benefit. No obvious alternative to amiodarone as it is used to suppress symptomatic NSVT  - seen by Dr. Buddy Duty on 11/17/2015, recommended start methimazole, continue BB. Check for thyroid nodule or Grave's disease, however also cannot r/o transient thyroiditis.  - The patient was instructed to call [phone] to schedule the following visits at [place] at [facility], [facility], [address], [place], [place]:   1. Lab visit in one week for CBC with differential and hepatic function panel.   2. Lab visit in 3 weeks for TSH and free T4 level.   3. Office visit with Dr. Buddy Duty in 3 or 4 weeks.  Hilbert Corrigan PA-C Pager: [phone] Patient seen and examined. I agree with the assessment and plan as detailed above. See also my additional thoughts below.   IV diuresis is being resumed today. The assessment and plan above are carefully done and completed. The plans are outlined.  Dola Argyle, MD, [facility] 11/20/2015 10:17 AM

## 2015-11-20 NOTE — Progress Notes (Signed)
Subjective: Benjamin Mckay feels better today.  He again notes that the edema in his legs has improved.  He still has a nonproductive cough, which he attributes to his pulmonary edema.    Social history:  Benjamin Mckay is accompanied now by his wife.  Family medical history:   49 year-of-age granddaughter had hyperthyroidism from a toxic thyroid noduleyroid lobe.  Review of systems: No diarrhea or loose stools, but constipation.   No palpitations. Occasional trouble swallowing.   No hoarseness.  Objective: Vital signs in last 24 hours: Temp:  [97.8 F (36.6 C)-99 F (37.2 C)] 99 F (37.2 C) (02/06 1125) Pulse Rate:  [66-73] 66 (02/06 1125) Resp:  [16] 16 (02/06 1125) BP: (103-141)/(49-84) 129/50 mmHg (02/06 1125) SpO2:  [95 %-97 %] 95 % (02/06 1125) Weight:  [97.614 kg (215 lb 3.2 oz)] 97.614 kg (215 lb 3.2 oz) (02/06 0427) Weight change: 0.181 kg (6.4 oz) Last BM Date: 11/19/15  Intake/Output from previous day: 02/05 0701 - 02/06 0700 In: 1908 [P.O.:1680; I.V.:228] Out: 1725 [Urine:1725] Intake/Output this shift: Total I/O In: -  Out: 875 [Urine:875]  General: No apparent distress. Eyes: Anicteric, no proptosis. Neck: Supple, trachea midline. Thyroid: mostly substernal which limits the thyroid exam. Cardiovascular: Regular rhythm and rate, normal radial pulses, less edema in legs than during initial visit. Respiratory: Normal respiratory effort, clear to auscultation. Gastrointestinal: Nontender abdomen without appreciable hepatomegaly. Neurologic: Cranial nerves normal as tested, deep tendon reflexes 1+, no tremor. Musculoskeletal: Normal muscle tone, no muscle atrophy. Skin: Mildly excessive warmth, no visible rash, no onycholysis. Mental status: Alert, conversant, speech clear, thought logical, appropriate mood and affect, no hallucinations or delusions evident. Hematologic/lymphatic: No cervical adenopathy, no jaundice.  Lab Results: Lab Results  Component Value Date    TSH 0.047* 11/18/2015   TSH 0.040* 11/15/2015   TSH 0.034* 11/13/2015   TSH 1.430 05/17/2015   FREET4 >5.50* 11/18/2015   FREET4 >5.50* 11/15/2015   WBC 7.4 11/19/2015   HGB 12.3* 11/19/2015   NEUTROABS 5.1 11/19/2015   PLT 138* 11/19/2015   ALKPHOS 95 11/13/2015   ALT 61 11/13/2015   AST 48* 11/13/2015   BILITOT 1.8* 11/13/2015   THYROGLOBULI 35.2* 11/18/2015   Medications: I have reviewed the patient's current medications.  Assessment/Plan: 1.  Amiodarone-associated thyrotoxicosis. 2.  Goiter.   3.  Thyroid autoimmunity (abnormal result for anti-thyroglobulin antibody).  4.  Medication monitoring.  Today I reviewed the lab findings, ultrasound findings, and care plan in detail with the patient and his wife.  I reviewed the ultrasound report and ultrasound images--enlarged thyroid gland, heterogenous echotexture.  The thyroid stimulating immunoglobulin test should help [place] distinguish underlying Graves' disease from underlying Hashimoto's thyroiditis.    Continue beta-blocker therapy, as ordered by cardiologist.  Continue methimazole 30 mg by mouth per day--divided three times a day during hospital stay, but change to three 10 mg tablets once a day as an outpatient.    Discharge planning at discretion of cardiologist.   LOS: 7 days   Benjamin Mckay 11/20/2015, 5:24 PM

## 2015-11-20 NOTE — Progress Notes (Addendum)
ANTICOAGULATION CONSULT NOTE - Follow Up Consult  Pharmacy Consult for Coumadin Indication: atrial fibrillation  Patient Measurements: Height: 5\' 9"  (175.3 cm) Weight: 215 lb 3.2 oz (97.614 kg) (Scale C) IBW/kg (Calculated) : 70.7  Vital Signs: Temp: 97.8 F (36.6 C) (02/06 0427) Temp Source: Oral (02/06 0427) BP: 125/53 mmHg (02/06 0939) Pulse Rate: 73 (02/06 0939)  Labs:  Recent Labs  11/18/15 0433 11/19/15 0520 11/19/15 1225 11/20/15 0410  HGB  --   --  12.3*  --   HCT  --   --  36.0*  --   PLT  --   --  138*  --   LABPROT 24.4* 26.9*  --  29.9*  INR 2.21* 2.52*  --  2.91*  CREATININE 1.48* 1.67*  --  1.44*    Estimated Creatinine Clearance: 55.8 mL/min (by C-G formula based on Cr of 1.44).  Assessment: 70 y/o male on warfarin for Afib. 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 INR will need to be monitored closely while he is on methimazole. CBC stable. INR 2.5  > 2.9.  Home warfarin regimen: 1.25 mg on Mondays and Fridays, 2.5 mg on all other days.  Goal of Therapy:  INR 2-3 Monitor platelets by anticoagulation protocol: Yes   Plan:   Coumadin 2.5 mg po x1  Daily PT/INR   Monitor for s/sx bleeding    Hughes Better, PharmD, BCPS Clinical Pharmacist Pager: [phone] 11/20/2015 10:33 AM

## 2015-11-20 NOTE — Care Management Important Message (Signed)
Important Message  Patient Details  Name: Benjamin Mckay MRN: [id number] Date of Birth: [birth year]   Medicare Important Message Given:  Yes    Louanne Belton 11/20/2015, 12:32 [place] Message  Patient Details  Name: Benjamin Mckay MRN: [id number] Date of Birth: 11-10-1945   Medicare Important Message Given:  Yes    Idali Lafever, Neal Dy 11/20/2015, 12:32 PM

## 2015-11-21 ENCOUNTER — Telehealth: Payer: Self-pay | Admitting: Cardiovascular Disease

## 2015-11-21 ENCOUNTER — Encounter: Payer: Medicare Other | Admitting: Cardiovascular Disease

## 2015-11-21 ENCOUNTER — Encounter: Payer: PPO | Admitting: Pharmacist Clinician (PhC)/ Clinical Pharmacy Specialist

## 2015-11-21 DIAGNOSIS — R05 Cough: Secondary | ICD-10-CM | POA: Diagnosis not present

## 2015-11-21 DIAGNOSIS — I5023 Acute on chronic systolic (congestive) heart failure: Secondary | ICD-10-CM | POA: Diagnosis not present

## 2015-11-21 LAB — PROTIME-INR
INR: 3 — AB (ref 0.00–1.49)
PROTHROMBIN TIME: 30.6 s — AB (ref 11.6–15.2)

## 2015-11-21 LAB — BASIC METABOLIC PANEL
Anion gap: 10 (ref 5–15)
BUN: 28 mg/dL — AB (ref 6–20)
CALCIUM: 8.5 mg/dL — AB (ref 8.9–10.3)
CO2: 29 mmol/L (ref 22–32)
CREATININE: 1.37 mg/dL — AB (ref 0.61–1.24)
Chloride: 93 mmol/L — ABNORMAL LOW (ref 101–111)
GFR calc Af Amer: 59 mL/min — ABNORMAL LOW (ref >60)
GFR, EST NON AFRICAN AMERICAN: 51 mL/min — AB (ref >60)
Glucose, Bld: 103 mg/dL — ABNORMAL HIGH (ref 65–99)
Potassium: 3.9 mmol/L (ref 3.5–5.1)
SODIUM: 132 mmol/L — AB (ref 135–145)

## 2015-11-21 MED ORDER — WARFARIN SODIUM 2.5 MG PO TABS
1.2500 mg | ORAL_TABLET | Freq: Once | ORAL | Status: AC
  Administered 2015-11-21: 1.25 mg via ORAL
  Filled 2015-11-21 (×2): qty 0.5

## 2015-11-21 NOTE — Progress Notes (Signed)
ANTICOAGULATION CONSULT NOTE - Follow Up Consult  Pharmacy Consult for Coumadin Indication: atrial fibrillation  Patient Measurements: Height: 5\' 9"  (175.3 cm) Weight: 212 lb (96.163 kg) (scale c) IBW/kg (Calculated) : 70.7  Vital Signs: Temp: 98.8 F (37.1 C) (02/06 2251) Temp Source: Oral (02/06 2251) BP: 114/48 mmHg (02/06 2251) Pulse Rate: 65 (02/06 2251)  Labs:  Recent Labs  11/19/15 0520 11/19/15 1225 11/20/15 0410 11/20/15 1418 11/21/15 0336  HGB  --  12.3*  --   --   --   HCT  --  36.0*  --   --   --   PLT  --  138*  --   --   --   LABPROT 26.9*  --  29.9*  --  30.6*  INR 2.52*  --  2.91*  --  3.00*  CREATININE 1.67*  --  1.44* 1.46* 1.37*    Estimated Creatinine Clearance: 58.2 mL/min (by C-G formula based on Cr of 1.37).  Assessment: 70 y/o male on warfarin for Afib. 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 INR will need to be monitored closely while he is on methimazole. CBC stable. INR 2.5  > 2.9 > 3  Home warfarin regimen: 1.25 mg on Mondays and Fridays, 2.5 mg on all other days.  Goal of Therapy:  INR 2-3 Monitor platelets by anticoagulation protocol: Yes   Plan:   Coumadin 1.25 mg po x1  Daily PT/INR   Monitor for s/sx bleeding    Hughes Better, PharmD, BCPS Clinical Pharmacist Pager: [phone] 11/21/2015 8:16 AM

## 2015-11-21 NOTE — Progress Notes (Signed)
Talked to patient about DCP; patient lives at home with spouse, independent prior to admission, drives his vehicle and is active in the community. Patient could benefit from the [facility] CHF program; patient is agreeable for this service, referral made. [facility] [phone]

## 2015-11-21 NOTE — Telephone Encounter (Signed)
pt's wife calling re status of FMLA paperwork

## 2015-11-21 NOTE — Telephone Encounter (Signed)
Received FMLA Forms ([facility]) back from [place] @ [facility] for Dr Sallyanne Kuster to review, complete and sign.  Forms given to B Lassiiter for Dr [employer]. lp

## 2015-11-21 NOTE — Progress Notes (Signed)
Patient Name: [facility] Date of Encounter: 11/21/2015  Primary Cardiologist: Cro[place]u   Principal Problem:   Acute on chronic systolic heart failure ([place]) Active Problems:   HLD   HTN (hypertension)   CAD (coronary artery disease)   Chronic atrial fibrillation (HCC)   CHB (complete heart block) ([place])   Single chamber St. Jude pacemaker 2013   Cardiomyopathy, ischemic   Warfarin anticoagulation   On amiodarone therapy   Hyperthyroidism    SUBJECTIVE  Complains of persistent productive cough. States his swelling is better but still not to baseline. Denies SOB, CP, orthopnea, or palpitations.   CURRENT MEDS . amiodarone  200 mg Oral Daily  . aspirin EC  81 mg Oral Daily  . azithromycin  250 mg Oral Daily  . furosemide  80 mg Intravenous BID  . methimazole  10 mg Oral TID  . metoprolol succinate  25 mg Oral Daily  . pantoprazole  40 mg Oral Daily  . potassium chloride  20 mEq Oral BID  . pravastatin  80 mg Oral QHS  . sodium chloride flush  3 mL Intravenous Q12H  . tamsulosin  0.4 mg Oral QHS  . warfarin  1.25 mg Oral ONCE-1800  . Warfarin - Pharmacist Dosing Inpatient   Does not apply q1800    OBJECTIVE  Filed Vitals:   11/20/15 0939 11/20/15 1125 11/20/15 2251 11/21/15 0553  BP: 125/53 129/50 114/48   Pulse: 73 66 65   Temp:  99 F (37.2 C) 98.8 F (37.1 C)   TempSrc:  Oral Oral   Resp:  16 16   Height:      Weight:    212 lb (96.163 kg)  SpO2:  95% 97%     Intake/Output Summary (Last 24 hours) at 11/21/15 0828 Last data filed at 11/21/15 0557  Gross per 24 hour  Intake    480 ml  Output   2555 ml  Net  -2075 ml   Filed Weights   11/19/15 0407 11/20/15 0427 11/21/15 0553  Weight: 214 lb 12.8 oz (97.433 kg) 215 lb 3.2 oz (97.614 kg) 212 lb (96.163 kg)    PHYSICAL EXAM  General: Pleasant, well developed, well nourished male in NAD. Neuro: Alert and oriented X 3. Moves all extremities spontaneously. Psych: Normal affect. HEENT:   Normal  Neck: Supple without bruits. Lungs:  Resp regular and unlabored. Mild rackles auscultated b/l in posterior lung bases.  Heart: RRR no s3, s4. Systolic murmur noted at LUSB.  Abdomen: Soft, non-tender, non-distended, BS + x 4.  Extremities: No clubbing, cyanosis. 2+ pitting edema to upper knees b/l. DP/PT/Radials 2+ and equal bilaterally.  Accessory Clinical Findings  CBC  Recent Labs  11/19/15 1225  WBC 7.4  NEUTROABS 5.1  HGB 12.3*  HCT 36.0*  MCV 94.7  PLT 0000000*   Basic Metabolic Panel  Recent Labs  11/20/15 1418 11/21/15 0336  NA 133* 132*  K 4.1 3.9  CL 97* 93*  CO2 30 29  GLUCOSE 153* 103*  BUN 31* 28*  CREATININE 1.46* 1.37*  CALCIUM 8.4* 8.5*    TELE  Ventricular paced rhythm , HR 68 bpm.   Radiology/Studies  11/19/2015 US Soft Tissue Head/Neck: FINDINGS: Right thyroid lobe Measurements: 6.3 x 3.0 x 2.0 cm. No nodules visualized. Heterogeneous parenchyma is noted. Left thyroid lobe Measurements: 5.5 x 3.1 x 2.4 cm. No nodules visualized. Heterogeneous parenchyma is noted. Isthmus Thickness: 7 mm. No nodules visualized. Lymphadenopathy None visualized. IMPRESSION: Heterogeneous parenchyma of both  thyroid lobes is noted suggesting multinodular goiter or thyroiditis, but no dominant mass or nodule is noted  11/17/2015 DG Chest 2 View: FINDINGS: Left subclavian pacemaker device and leads are stable. Bibasilar hazy airspace disease has improved. Lung volumes have improved. No pneumothorax. Small pleural effusions are suspected. IMPRESSION: Improved bibasilar airspace disease. Small pleural effusions are suspected.  11/15/2015 Echo: Study Conclusions - Left ventricle: The cavity size was normal. There was mild concentric hypertrophy. Systolic function was mildly reduced. The estimated ejection fraction was in the range of 45% to 50%. There is akinesis of the anteroseptal myocardium. Doppler parameters are consistent with high  ventricular filling pressure. - Aortic valve: There was mild regurgitation. - Mitral valve: Calcified annulus. - Right ventricle: Pacer wire or catheter noted in right ventricle. - Right atrium: The atrium was mildly dilated. - Pulmonary arteries: Systolic pressure was moderately increased. PA peak pressure: 54 mm Hg (S). - Pericardium, extracardiac: There was a moderate-sized left pleural effusion.  ASSESSMENT AND PLAN  1. Acute on chronic systolic HF: with 30 lbs volume overload, likely related to dietary indiscretion - Echo 11/15/2015 EF 45-50%, akinesis of anteroseptal myocardium, mild AR, PA peak pressure 84mmHg, moderate L pleural effusion. Note EF improved from previous 30-35% since early 2016.  - I/O - 16L. Weight down from 232 to 212 lbs. Baseline 200 lbs. Given 1 day holiday on 2/5 to allow kidney to rest, lasix gtt discontinued.  Losartan and spironolactone on hold, plan to restart later before discharge.IV lasix 80mg  BID reinitiated yesterday. Patient tolerating well. Creatinine remains stable at 1.37. Continue with diuresis.  Patient wearing compression stockings. Cough has not improved, Tmax 99.0 yesterday, WBC 7.4. CXR 11/17/15 with improved bibasilar airspace and small pleural effusions. Continue to monitor for sign of infection.   2. ICM with baseline EF 30-35%, now improved to 45-50%  3. Permanent afib: Continue amiodarone, metoprolol and warfarin per pharmacy consult - markedly hyperthyroid free T3 12.6, free T4 > 5.5, TSH 0.04. Likely related to amiodarone toxicity. However amiodarone has also been suppressing his symptomatic NSVT.  - EP consulted on 11/16/2015 recommend continue amiodarone given the benefit, and treat hyperthyroid.   4. CAD: no angina  5. H/o CHB s/p St Jude PPM since 1984, pacemaker dependent - although he may benefit from upgrading to BiV ICD, however he has multiple previous PPM, at high  risk for infection. Decided to manage conservatively. Seen by EP on 11/16/2015, did not recommend lead extraction or insertion of BiV ICD at this point.  6. Hyperthyroidism: likely related to amiodarone. EP consulted on 2/2, recommend treat hyperthyroidism and maintain amio given its benefit. No obvious alternative to amiodarone as it is used to suppress symptomatic NSVT  -Per Dr. Buddy Duty 11/20/2015, [place] results discussed with patient and wife: ultrasound report and ultrasound images showed enlarged thyroid gland, heterogenous. Awaiting result of thyroid stimulating immunoglobulin test to distinguish underlying Grave's disease vs Hashimoto's thyroiditis. Continue methimazole 30 mg po daily, divided three times a day during hospital stay, but change to three 10 mg tablets once a day as an outpatient. Continue BB.  - The patient was instructed to call [phone] to schedule the following visits at [place] at [facility], [facility], [address], [place], [place]: 1. Lab visit in one week for CBC with differential and hepatic function panel. 2. Lab visit in 3 weeks for TSH and free T4 level. 3. Office visit with Dr. Buddy Duty in 3 or 4 weeks.  Signed, Tenna Delaine  PA-Student  Patient has been seen with Benjamin Mckay, physical exam has been performed separately, we have discussed treatment plan, continue IV lasix for now, patient is still 7 lbs over his baseline weight 200-205  Hilbert Corrigan PA Pager: [phone] Patient seen and examined. I agree with the assessment and plan as detailed above. See also my additional thoughts below.   The patient continues to improve with continued diuresis. I have reviewed all of the note above and agree with the approach to treatment.  Dola Argyle, MD, [facility] 11/21/2015 10:07 AM

## 2015-11-21 NOTE — Telephone Encounter (Signed)
Aware FMLA papers are waiting for dr c to sign. Aware he is back in the office tomorrow

## 2015-11-22 ENCOUNTER — Encounter: Payer: Self-pay | Admitting: Gastroenterology

## 2015-11-22 ENCOUNTER — Telehealth: Payer: Self-pay | Admitting: Cardiovascular Disease

## 2015-11-22 DIAGNOSIS — R05 Cough: Secondary | ICD-10-CM | POA: Diagnosis not present

## 2015-11-22 DIAGNOSIS — R059 Cough, unspecified: Secondary | ICD-10-CM

## 2015-11-22 DIAGNOSIS — I5023 Acute on chronic systolic (congestive) heart failure: Secondary | ICD-10-CM | POA: Diagnosis not present

## 2015-11-22 LAB — BASIC METABOLIC PANEL
Anion gap: 9 (ref 5–15)
BUN: 25 mg/dL — AB (ref 6–20)
CHLORIDE: 95 mmol/L — AB (ref 101–111)
CO2: 31 mmol/L (ref 22–32)
Calcium: 8.5 mg/dL — ABNORMAL LOW (ref 8.9–10.3)
Creatinine, Ser: 1.27 mg/dL — ABNORMAL HIGH (ref 0.61–1.24)
GFR calc Af Amer: >60 mL/min (ref >60)
GFR calc non Af Amer: 56 mL/min — ABNORMAL LOW (ref >60)
Glucose, Bld: 104 mg/dL — ABNORMAL HIGH (ref 65–99)
POTASSIUM: 4.1 mmol/L (ref 3.5–5.1)
SODIUM: 135 mmol/L (ref 135–145)

## 2015-11-22 LAB — PROTIME-INR
INR: 3.71 — ABNORMAL HIGH (ref 0.00–1.49)
Prothrombin Time: 35.9 seconds — ABNORMAL HIGH (ref 11.6–15.2)

## 2015-11-22 MED ORDER — WARFARIN 0.5 MG HALF TABLET
0.5000 mg | ORAL_TABLET | Freq: Once | ORAL | Status: AC
Start: 2015-11-22 — End: 2015-11-22
  Administered 2015-11-22: 0.5 mg via ORAL
  Filled 2015-11-22: qty 1

## 2015-11-22 MED ORDER — SPIRONOLACTONE 25 MG PO TABS
12.5000 mg | ORAL_TABLET | Freq: Every day | ORAL | Status: DC
  Administered 2015-11-22 – 2015-11-24 (×3): 12.5 mg via ORAL
  Filled 2015-11-22 (×3): qty 1

## 2015-11-22 NOTE — Telephone Encounter (Signed)
Received signed FMLA Forms back from Dr Sallyanne Kuster.  Patient had telephoned office and spoke with [facility] in Triage.  He advised papers signed and ready for pick up.  Patient asked that her FMLA papers be faxed to her employer today.  Faxed forms to [employer] and prepared her copy for pick up today. lp

## 2015-11-22 NOTE — Telephone Encounter (Signed)
Checked w [employer] - paperwork ready. Returned call and informed Benay Spice that FMLA paperwork available for pickup. She voiced understanding, no further questions.

## 2015-11-22 NOTE — Progress Notes (Signed)
ANTICOAGULATION CONSULT NOTE - Follow Up Consult  Pharmacy Consult for Coumadin Indication: atrial fibrillation  Patient Measurements: Height: 5\' 9"  (175.3 cm) Weight: 208 lb 1.6 oz (94.394 kg) (Scale C) IBW/kg (Calculated) : 70.7  Vital Signs: Temp: 97.4 F (36.3 C) (02/08 0412) Temp Source: Oral (02/08 0412) BP: 115/43 mmHg (02/08 0412) Pulse Rate: 66 (02/08 0412)  Labs:  Recent Labs  11/19/15 1225  11/20/15 0410 11/20/15 1418 11/21/15 0336 11/22/15 0608  HGB 12.3*  --   --   --   --   --   HCT 36.0*  --   --   --   --   --   PLT 138*  --   --   --   --   --   LABPROT  --   --  29.9*  --  30.6* 35.9*  INR  --   --  2.91*  --  3.00* 3.71*  CREATININE  --   < > 1.44* 1.46* 1.37* 1.27*  < > = values in this interval not displayed.  Estimated Creatinine Clearance: 62.3 mL/min (by C-G formula based on Cr of 1.27).  Assessment: 70 y/o male on warfarin for Afib. 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 INR continuous to fluctuate and will need to be monitored closely while he is on methimazole. Surprisingly, the INR continues to increase 3 > 3.7 today. Generally when patients are on methimazole, the metabolism of Vitamin K dependent clotting factors is reduced, making the patient require more warfarin. Please monitor closely.  Home warfarin regimen: 1.25 mg on Mondays and Fridays, 2.5 mg on all other days.  Goal of Therapy:  INR 2-3 Monitor platelets by anticoagulation protocol: Yes   Plan:   Coumadin 0.5 mg po x1  Daily PT/INR   Monitor for s/sx bleeding    Hughes Better, PharmD, BCPS Clinical Pharmacist Pager: [phone] 11/22/2015 8:23 AM

## 2015-11-22 NOTE — Progress Notes (Signed)
Noted pt asleep while making round during change of shift.    Pt up in recliner. Will continue to monitor.  Karie Kirks, Therapist, sports.

## 2015-11-22 NOTE — Progress Notes (Signed)
Patient Name: Benjamin Mckay [facility] Date of Encounter: 11/22/2015     Principal Problem:   Acute on chronic systolic heart failure ([place]) Active Problems:   HLD   HTN (hypertension)   CAD (coronary artery disease)   Chronic atrial fibrillation (HCC)   CHB (complete heart block) ([place])   Single chamber St. Jude pacemaker 2013   Cardiomyopathy, ischemic   Warfarin anticoagulation   On amiodarone therapy   Hyperthyroidism    SUBJECTIVE Patient states that his cough is getting progressively worse. Notes that it is productive with dark tan sputum production and occurs intermittently throughout the day. States that his edema is much better. Denies SOB, CP, palpitations, orthopnea, and fever.   CURRENT MEDS . amiodarone  200 mg Oral Daily  . aspirin EC  81 mg Oral Daily  . azithromycin  250 mg Oral Daily  . furosemide  80 mg Intravenous BID  . methimazole  10 mg Oral TID  . metoprolol succinate  25 mg Oral Daily  . pantoprazole  40 mg Oral Daily  . potassium chloride  20 mEq Oral BID  . pravastatin  80 mg Oral QHS  . sodium chloride flush  3 mL Intravenous Q12H  . tamsulosin  0.4 mg Oral QHS  . warfarin  0.5 mg Oral ONCE-1800  . Warfarin - Pharmacist Dosing Inpatient   Does not apply q1800    OBJECTIVE  Filed Vitals:   11/21/15 1241 11/21/15 1734 11/21/15 2107 11/22/15 0412  BP: 125/55 129/51 110/41 115/43  Pulse: 65 66 68 66  Temp: 98.6 F (37 C) 98.2 F (36.8 C) 97.8 F (36.6 C) 97.4 F (36.3 C)  TempSrc: Oral Oral Oral Oral  Resp: 16 18 16 17   Height:      Weight:    208 lb 1.6 oz (94.394 kg)  SpO2: 98% 97% 96% 96%    Intake/Output Summary (Last 24 hours) at 11/22/15 1019 Last data filed at 11/22/15 0845  Gross per 24 hour  Intake    840 ml  Output   2250 ml  Net  -1410 ml   Filed Weights   11/20/15 0427 11/21/15 0553 11/22/15 0412  Weight: 215 lb 3.2 oz (97.614 kg) 212 lb (96.163 kg) 208 lb 1.6 oz (94.394 kg)    PHYSICAL EXAM  General: Pleasant, well  developed, well nourished male in NAD. Neuro: Alert and oriented X 3. Moves all extremities spontaneously. Psych: Normal affect. HEENT:  Normal  Neck: Supple without bruits. Lungs:  Resp regular and unlabored, CTAB, no rhonchi, wheezes, or rales.  Heart: RRR no s3, s4, systolic murmur noted at RUSB. Abdomen: Soft, non-tender, non-distended, BS + x 4.  Extremities: No clubbing, cyanosis. 2+ pitting edema in lower extremities to knees b/l. DP/PT/Radials 2+ and equal bilaterally.  Accessory Clinical Findings  CBC  Recent Labs  11/19/15 1225  WBC 7.4  NEUTROABS 5.1  HGB 12.3*  HCT 36.0*  MCV 94.7  PLT 0000000*   Basic Metabolic Panel  Recent Labs  11/21/15 0336 11/22/15 0608  NA 132* 135  K 3.9 4.1  CL 93* 95*  CO2 29 31  GLUCOSE 103* 104*  BUN 28* 25*  CREATININE 1.37* 1.27*  CALCIUM 8.5* 8.5*    TELE: Ventricular paced rhythm, HR 60-70s.    Radiology/Studies Dg Chest 2 View 11/17/2015  CLINICAL DATA:  Cough EXAM: CHEST  2 VIEW COMPARISON:  11/13/2015 FINDINGS: Left subclavian pacemaker device and leads are stable. Bibasilar hazy airspace disease has improved. Lung volumes have  improved. No pneumothorax. Small pleural effusions are suspected. IMPRESSION: Improved bibasilar airspace disease. Small pleural effusions are suspected. Electronically Signed   By: Marybelle Killings M.D.   On: 11/17/2015 14:11   US Soft Tissue Head/neck 11/19/2015  CLINICAL DATA:  Hyperthyroidism due to amiodarone. EXAM: THYROID ULTRASOUND TECHNIQUE: Ultrasound examination of the thyroid gland and adjacent soft tissues was performed. COMPARISON:  None. FINDINGS: Right thyroid lobe Measurements: 6.3 x 3.0 x 2.0 cm. No nodules visualized. Heterogeneous parenchyma is noted. Left thyroid lobe Measurements: 5.5 x 3.1 x 2.4 cm. No nodules visualized. Heterogeneous parenchyma is noted. Isthmus Thickness: 7 mm.  No nodules visualized. Lymphadenopathy None visualized. IMPRESSION: Heterogeneous parenchyma of both  thyroid lobes is noted suggesting multinodular goiter or thyroiditis, but no dominant mass or nodule is noted. Electronically Signed   By: Marijo Conception, M.D.   On: 11/19/2015 07:05    ASSESSMENT AND PLAN  1. Acute on chronic systolic HF: with 30 lbs volume overload, likely related to dietary indiscretion - Echo 11/15/2015 EF 45-50%, akinesis of anteroseptal myocardium, mild AR, PA peak pressure 69mmHg, moderate L pleural effusion. Note EF improved from previous 30-35% since early 2016.  - Net I/O since admission - 17.4 L. Weight down from 232 to 208  lbs. Baseline 200 lbs. Given 1 day holiday on 2/5 to allow kidney to rest, lasix gtt discontinued. Losartan on hold, plan to restart later before discharge. Receiving IV lasix 80mg  BID. Patient tolerating well. Creatinine remains stable at 1.27. Continue with diuresis. Continue monitoring creatinine. Patient wearing compression stockings. Cough has not improved, but in fact gotten progressively worse per patient. Temp is not elevated.  CXR 11/17/15 with improved bibasilar airspace and small pleural effusions. Patient has no relief with tessalon, gauifenesin-codeine, or zithromax. Patient is not on ACEi.   - will restart low dose spironolactone today 12.5mg  daily. Will start losartan   2. ICM with baseline EF 30-35%, now improved to 45-50%  3. Permanent afib: Continue amiodarone, metoprolol and warfarin per pharmacy consult - markedly hyperthyroid free T3 12.6, free T4 > 5.5, TSH 0.04. Likely related to amiodarone toxicity. However amiodarone has also been suppressing his symptomatic NSVT.  - EP consulted on 11/16/2015 recommend continue amiodarone given the benefit, and treat hyperthyroid.  - INR is 3.71, pharmacy following   4. CAD: no angina  5. H/o CHB s/p St Jude PPM since 1984, pacemaker dependent - although he may benefit from upgrading to BiV ICD, however he has  multiple previous PPM, at high risk for infection. Decided to manage conservatively. Seen by EP on 11/16/2015, did not recommend lead extraction or insertion of BiV ICD at this point.  6. Hyperthyroidism: likely related to amiodarone. EP consulted on 2/2, recommend treat hyperthyroidism and maintain amio given its benefit. No obvious alternative to amiodarone as it is used to suppress symptomatic NSVT -Per Dr. Buddy Duty 11/20/2015, [place] results discussed with patient and wife: ultrasound report and ultrasound images showed enlarged thyroid gland, heterogenous. Awaiting result of thyroid stimulating immunoglobulin test to distinguish underlying Grave's disease vs Hashimoto's thyroiditis. Continue methimazole 30 mg po daily, divided three times a day during hospital stay, but change to three 10 mg tablets once a day as an outpatient. Continue BB.  - The patient was instructed to call [phone] to schedule the following visits at [place] [facility], [facility], [address], [place], [place]: 1. Lab visit in one week for CBC with differential and hepatic function panel. 2. Lab visit  in 3 weeks for TSH and free T4 level. 3. Office visit with Dr. Buddy Duty in 3 or 4 weeks.   Signed, [place]  Patient seen and examined separately with [facility], treatment plan discussed with [place] and Dr. Ron Parker, need 1 more day of diuresis. Hopefully euvolemic tomorrow.  Hilbert Corrigan PA Pager: [phone] Patient seen and examined. I agree with the assessment and plan as detailed above. See also my additional thoughts below.   In addition to the problems as outlined above, the patient has a cough. I feel this is not related to his CHF. He is not febrile. He does have productive sputum with the cough that is discolored. He is currently receiving medications  including erythromycin. The plan for now will be to continue the same therapy.  Dola Argyle, MD, [facility] 11/22/2015 11:26 AM

## 2015-11-22 NOTE — Telephone Encounter (Signed)
New message      Returning a call to someone regarding the FMLA papers

## 2015-11-23 ENCOUNTER — Inpatient Hospital Stay (HOSPITAL_COMMUNITY): Payer: PPO

## 2015-11-23 DIAGNOSIS — I5023 Acute on chronic systolic (congestive) heart failure: Secondary | ICD-10-CM | POA: Diagnosis not present

## 2015-11-23 DIAGNOSIS — R0982 Postnasal drip: Secondary | ICD-10-CM | POA: Diagnosis not present

## 2015-11-23 DIAGNOSIS — R05 Cough: Secondary | ICD-10-CM

## 2015-11-23 DIAGNOSIS — I255 Ischemic cardiomyopathy: Secondary | ICD-10-CM | POA: Diagnosis not present

## 2015-11-23 LAB — BASIC METABOLIC PANEL
ANION GAP: 7 (ref 5–15)
BUN: 21 mg/dL — ABNORMAL HIGH (ref 6–20)
CALCIUM: 8.6 mg/dL — AB (ref 8.9–10.3)
CO2: 31 mmol/L (ref 22–32)
Chloride: 98 mmol/L — ABNORMAL LOW (ref 101–111)
Creatinine, Ser: 1.17 mg/dL (ref 0.61–1.24)
Glucose, Bld: 95 mg/dL (ref 65–99)
Potassium: 3.8 mmol/L (ref 3.5–5.1)
Sodium: 136 mmol/L (ref 135–145)

## 2015-11-23 LAB — PROTIME-INR
INR: 3.81 — AB (ref 0.00–1.49)
PROTHROMBIN TIME: 36.6 s — AB (ref 11.6–15.2)

## 2015-11-23 NOTE — Progress Notes (Addendum)
Patient Name: [facility] Date of Encounter: 11/23/2015     Principal Problem:   Acute on chronic systolic heart failure ([place]) Active Problems:   HLD   HTN (hypertension)   CAD (coronary artery disease)   Chronic atrial fibrillation (HCC)   CHB (complete heart block) ([place])   Single chamber St. Jude pacemaker 2013   Cardiomyopathy, ischemic   Warfarin anticoagulation   On amiodarone therapy   Hyperthyroidism   Cough with sputum    SUBJECTIVE: Patient reports that his cough is still aggravating. States that it is still productive, but not producing as much sputum with color as he was yesterday.  However, while I was in the room, patient did cough up sputum that was reddish/tan in color. Notes that some bouts are so intense that he feels as if he may vomit. In terms of edema, patient reports that he is feeling better with each day. States he still has some fluid in his legs but other than that no complaints. Denies SOB, CP, palpitations, orthopnea, and fever.  CURRENT MEDS . amiodarone  200 mg Oral Daily  . aspirin EC  81 mg Oral Daily  . azithromycin  250 mg Oral Daily  . furosemide  80 mg Intravenous BID  . methimazole  10 mg Oral TID  . metoprolol succinate  25 mg Oral Daily  . pantoprazole  40 mg Oral Daily  . potassium chloride  20 mEq Oral BID  . pravastatin  80 mg Oral QHS  . sodium chloride flush  3 mL Intravenous Q12H  . spironolactone  12.5 mg Oral Daily  . tamsulosin  0.4 mg Oral QHS  . Warfarin - Pharmacist Dosing Inpatient   Does not apply q1800    OBJECTIVE  Filed Vitals:   11/22/15 1714 11/23/15 0436 11/23/15 0700 11/23/15 0800  BP: 127/57 133/51 125/56   Pulse: 67 65 75 71  Temp:  98.2 F (36.8 C) 98.2 F (36.8 C)   TempSrc:  Oral Oral   Resp:  20 20   Height:      Weight:  202 lb 11.2 oz (91.944 kg)    SpO2:  95% 93%     Intake/Output Summary (Last 24 hours) at 11/23/15 0835 Last data filed at 11/23/15 0800  Gross per 24 hour  Intake    1660 ml  Output   2775 ml  Net  -1115 ml   Filed Weights   11/21/15 0553 11/22/15 0412 11/23/15 0436  Weight: 212 lb (96.163 kg) 208 lb 1.6 oz (94.394 kg) 202 lb 11.2 oz (91.944 kg)    PHYSICAL EXAM  General: Pleasant, well developed, well nourished male in NAD.  Neuro: Alert and oriented X 3. Moves all extremities spontaneously. Psych: Normal affect. HEENT:  Normal  Neck: Supple without bruits.  Lungs:  Resp regular and unlabored, CTAB, no rhonchi, wheezes, or rales. Heart: RRR no s3, s4, systolic murmur noted at RUSB. Abdomen: Soft, non-tender, non-distended, BS + x 4.  Extremities: No clubbing, cyanosis. 2+ pitting edema in lower extremities to knees b/l, more pronounced along mid shins and at knees.  DP/PT/Radials 2+ and equal bilaterally.  Accessory Clinical Findings  Basic Metabolic Panel  Recent Labs  11/22/15 0608 11/23/15 0547  NA 135 136  K 4.1 3.8  CL 95* 98*  CO2 31 31  GLUCOSE 104* 95  BUN 25* 21*  CREATININE 1.27* 1.17  CALCIUM 8.5* 8.6*    TELE Ventricular paced rhythm with HR in 70s.  Radiology/Studies  Dg Chest 2 View 11/17/2015  CLINICAL DATA:  Cough EXAM: CHEST  2 VIEW COMPARISON:  11/13/2015 FINDINGS: Left subclavian pacemaker device and leads are stable. Bibasilar hazy airspace disease has improved. Lung volumes have improved. No pneumothorax. Small pleural effusions are suspected. IMPRESSION: Improved bibasilar airspace disease. Small pleural effusions are suspected. Electronically Signed   By: Marybelle Killings M.D.   On: 11/17/2015 14:11   US Soft Tissue Head/neck 11/19/2015  CLINICAL DATA:  Hyperthyroidism due to amiodarone. EXAM: THYROID ULTRASOUND TECHNIQUE: Ultrasound examination of the thyroid gland and adjacent soft tissues was performed. COMPARISON:  None. FINDINGS: Right thyroid lobe Measurements: 6.3 x 3.0 x 2.0 cm. No nodules visualized. Heterogeneous parenchyma is noted. Left thyroid lobe Measurements: 5.5 x 3.1 x 2.4 cm. No nodules  visualized. Heterogeneous parenchyma is noted. Isthmus Thickness: 7 mm.  No nodules visualized. Lymphadenopathy None visualized. IMPRESSION: Heterogeneous parenchyma of both thyroid lobes is noted suggesting multinodular goiter or thyroiditis, but no dominant mass or nodule is noted. Electronically Signed   By: Marijo Conception, M.D.   On: 11/19/2015 07:05    ASSESSMENT AND PLAN 79M with ischemic cardiomyopathy LVEF 30-35%, CAD, permanent atrial fibrillation, CHB s/p ICD, and NSVT here with acute on chronic systolic heart failure.  1. Acute on chronic systolic HF: with 30 lbs volume overload, likely related to dietary indiscretion - Echo 11/15/2015 EF 45-50%, akinesis of anteroseptal myocardium, mild AR, PA peak pressure 28mmHg, moderate L pleural effusion. Note EF improved from previous 30-35% since early 2016.  - Net I/O since admission -18.8 L. Weight down from 232 to 202 lbs. Baseline 200 lbs. Given 1 day holiday on 2/5 to allow kidney to rest, lasix gtt discontinued.   - Receiving IV lasix 80mg  BID. Patient tolerating well. Creatinine still improving, most recent value is 1.17. Although patient's weight is down to 202, he does not yet appear euvolemic. Still has 2+ pitting edema to the knees b/l. Notes that he thinks some of this weight loss may be due to not eating much while in hospital, notes that with his cough he has not had much of an appetite since admission.  Continue with diuresis. Anticipate 1 more day of IV diuresis. Continue monitoring creatinine.   -Will continue to hold off on Losartan 25mg  daily, creatinine and BP are stable  2. ICM: with baseline EF 30-35%, now improved to 45-50%  3. Permanent afib: Continue amiodarone, metoprolol and warfarin per pharmacy consult - markedly hyperthyroid free T3 12.6, free T4 > 5.5, TSH 0.04. Likely related to amiodarone toxicity. However amiodarone has also been suppressing his symptomatic NSVT.   - EP consulted on 11/16/2015 recommend continue amiodarone given the benefit, and treat  hyperthyroid. - INR is 3.81, pharmacy following   4. CAD: no angina. Will stop ASA in the setting of long term coumadin use.   5. H/o CHB: s/p St Jude PPM since 1984, pacemaker dependent - although he may benefit from upgrading to BiV ICD, however he has multiple previous  PPM, at high risk for infection. Decided to manage conservatively. Seen by EP on 11/16/2015, did not recommend lead extraction or insertion of BiV ICD at this point.  6. Hyperthyroidism: likely related to amiodarone.   -EP consulted on 2/2, recommend treat hyperthyroidism and maintain amio given its benefit. No obvious alternative to amiodarone as it is used to suppress symptomatic NSVT -Per Dr. Buddy Duty 11/20/2015, [place] results discussed with patient and wife: ultrasound report and ultrasound images showed enlarged thyroid gland,  heterogenous. Awaiting result of thyroid stimulating immunoglobulin test to distinguish underlying Grave's disease vs Hashimoto's thyroiditis. Continue methimazole 30 mg po daily, divided three times a day during hospital stay, but change to three 10 mg tablets once a day as an outpatient. Continue BB.  - The patient was instructed to call [phone] to schedule the following visits at [facility], [facility], [address], [place], [place] [facility]: 1. Lab visit in one week for CBC with differential and hepatic function panel. 2. Lab visit in 3 weeks for TSH and free T4 level. 3. Office visit with Dr. Buddy Duty in 3 or 4 weeks.  7. Cough w/ blood tinged sputum:  -Cough has not improved, still productive with less color to it than yesterday. However, while I was in the room, patient did cough up sputum that was reddish/tan in color.  Temp is not elevated.CXR 11/17/15 with improved bibasilar airspace and small pleural effusions Does not appear to be related to CHF. Patient is recieving tessalon, gauifenesin-codeine, and zithromax. Patient is not on ACEi. Plan is to continue same therapy.Although he has been on ABx, with continued cough I will repeat CXR and will get pulm consult. INR is supratheraputic and on ASA ( we will stop ASA)  Signed, Tenna Delaine PA-Student-- edited by Nell Range PA-C  Patient seen and examined. I agree with the assessment and plan as detailed above. See also my additional thoughts below.   The patient's CHF continues to improve. Unfortunately his cough is becoming worse in the hospital. Etiology is not clear. Today he had blood tinged sputum for the first time. Two-view chest x-ray has been ordered. We will request pulmonary consultation to help with the further assessment. His INR is mildly elevated. He does not need ongoing aspirin and this will be stopped today.  Dola Argyle, MD, [facility] 11/23/2015 11:29 AM

## 2015-11-23 NOTE — Consult Note (Signed)
Name: Benjamin Mckay MRN: [id number] DOB: 04-10-1946    ADMISSION DATE:  11/13/2015 CONSULTATION DATE:  11/23/2015  REFERRING MD :  Dr. Sallyanne Mckay  CHIEF COMPLAINT:  Cough  BRIEF PATIENT DESCRIPTION: 70 year old male admitted 1/30 with acute on chronic systolic CHF. Sucessfully diuresed. Cough, which was present on admission, has persisted and become increasingly productive for tan secretions with intermittent blood streaks. PCCM consulted for further evaluation of this.   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 He was admitted 1/30 for acute on chronic CHF with about 30 pound weight gain in a 6 month period. Notably he was on a 14 day cruise prior to admission. He admits diet on cruise was probably not most compliant with sodium restrictions. He was treated with aggressive IV diuresis, which he responded well to, as he is 18.5 L down from time of admission. Weight is down to 202lbs. Hospitalization now complicated by cough productive for tan sputum, with intermittent hemoptysis. Benjamin Mckay states that his cough has been present for some time, even predating his cruise. In fact due to cough and LE edema he presented to PCP prior to cruise. He was diagnosed with CAP and prescrbed PO azithromycin. Cough was not as productive at that time. He has noot described fevers with the exception of about 4 days ago when he had a low grade fever. He does complain of sinus congestion. He frequently picks at his nose and when he blows it there are thick tan bloody secretions. He also has a history of esophageal strictures and reports common difficult swallowing where "food sort of gets stuck, could  bring it back up, but eventually it goes down". He takes warfarin for PAF. PCCM consulted for further evaluation.  PAST MEDICAL HISTORY :   has a past medical history of Atrial fibrillation ([place]); Lymphoma (Benjamin Mckay) (1998); Diverticulosis; Esophageal stricture; Hyperlipidemia; Hypertension; CVA (cerebral infarction); Colon polyps; Urinary frequency; Umbilical hernia; Congenital heart block; CHB (complete heart block) ([place]) (11/06/2013); Cardiomyopathy, ischemic (11/07/2013); Chronic combined systolic and diastolic CHF (congestive heart failure) ([place]); and Hyperthyroidism (11/16/2015).  has past surgical history that includes Appendectomy [id number]); Colectomy; pacemaker placement; Tonsillectomy; neck fusion; Back surgery; CAROTID DOPPLER (11/04/2012); Pacemaker generator change (01/31/2012); Cardiac catheterization (01/06/2003); Cardiovascular stress test (07/16/2012); transthoracic echocardiogram (11/04/2012); and Permanent pacemaker generator change (N/A, 01/31/2012). Prior to Admission medications   Medication Sig Start Date End Date Taking? Authorizing Provider  amiodarone (PACERONE) 200 MG tablet TAKE 1 TABLET (200 MG TOTAL) BY MOUTH DAILY. Patient taking differently: TAKE 1 TABLET (200 MG TOTAL) BY MOUTH DAILY AT [facility] 07/11/15  Yes Benjamin Croitoru, MD  aspirin EC 81 MG tablet Take 81 mg by mouth daily.   Yes Historical Provider, MD  Dextromethorphan Polistirex (DELSYM PO) Take 10 mLs by mouth daily.   Yes Historical Provider, MD  furosemide (LASIX) 20 MG tablet Take 1 tablet (20 mg total) by mouth daily. Take an extra tablet daily PRN for weight gain > 3 lbs in a day or 5 lbs in a week. Patient taking differently: Take 20 mg by mouth See admin instructions. Take 1 tablet (20 mg) by mouth daily, maytake an extra tablet daily as needed  for weight gain > 3  lbs in a day or >5 lbs in a week. 12/20/14  Yes Benjamin G Barrett, PA-C  hydrocortisone cream 1 % Apply 1 application topically 2 (two) times daily as needed for  itching.   Yes Historical Provider, MD  ibuprofen (ADVIL,MOTRIN) 200 MG tablet Take 400 mg by mouth every 6 (six) hours as needed (pain).   Yes Historical Provider, MD  losartan (COZAAR) 25 MG tablet Take 1 tablet (25 mg total) by mouth daily. 12/20/14  Yes Benjamin G Barrett, PA-C  Melatonin 5 MG TABS Take 5 mg by mouth at bedtime.   Yes Historical Provider, MD  metoprolol (LOPRESSOR) 50 MG tablet Take 1 tablet (50 mg total) by mouth 2 (two) times daily. 12/20/14  Yes Benjamin G Barrett, PA-C  pravastatin (PRAVACHOL) 80 MG tablet Take 1 tablet (80 mg total) by mouth daily. Patient taking differently: Take 80 mg by mouth at bedtime.  10/18/14  Yes Benjamin Croitoru, MD  Tamsulosin HCl (FLOMAX) 0.4 MG CAPS Take 0.4 mg by mouth at bedtime.    Yes Historical Provider, MD  warfarin (COUMADIN) 2.5 MG tablet Take 1 tablet by mouth daily or as directed by coumadin clinic Patient taking differently: Take 1.25-2.5 mg by mouth daily at 6 PM. Take 1/2 tablet (1.25 mg) by mouth on Monday and Friday, take 1 tablet (2.5 mg) on Sunday, Tuesday, Wednesday, Thursday, Saturday or as directed by coumadin clinic 06/15/15  Yes Benjamin Croitoru, MD  potassium chloride SA (K-DUR,KLOR-CON) 20 MEQ tablet Take 1 tablet (20 mEq total) by mouth daily. Use as directed Patient not taking: Reported on 11/13/2015 10/27/15   Benjamin Mclean, MD   Allergies  Allergen Reactions  . Adhesive [Tape] Other (See Comments)    Skin irritation - please use paper tape  . Statins Other (See Comments)    Leg cramps.   Tolerates pravastatin.    Marland Kitchen Penicillins Rash    Has patient had a PCN reaction causing immediate rash, facial/tongue/throat swelling, SOB or lightheadedness with hypotension: Yes Has patient had a PCN reaction causing severe rash involving mucus membranes or skin necrosis: No Has patient had a PCN reaction that required hospitalization No Has patient had a PCN reaction occurring within the last 10 years: No If all of the above answers are  "NO", then may proceed with Cephalosporin use.    FAMILY HISTORY:  family history includes Diabetes in his maternal grandmother; Heart disease in his mother; Prostate cancer in his father; Stroke in his mother. There is no history of Colon cancer or Heart attack. SOCIAL HISTORY:  reports that he has never smoked. He does not have any smokeless tobacco history on file. He reports that he does not drink alcohol or use illicit drugs.  REVIEW OF SYSTEMS:   Bolds are positive  Constitutional: weight loss, gain, night sweats, Fevers, chills, fatigue .  HEENT: headaches, Sore throat, sneezing, nasal congestion, post nasal drip, Difficulty swallowing, Tooth/dental problems, visual complaints visual changes, ear ache CV:  chest pain, radiates: ,Orthopnea, PND, swelling in lower extremities, dizziness, palpitations, syncope.  GI  heartburn, indigestion, abdominal pain, nausea, vomiting, diarrhea, change in bowel habits, loss of appetite, bloody stools.  Resp: cough, productive: , hemoptysis, dyspnea, chest pain, pleuritic.  Skin: rash or itching or icterus GU: dysuria, change in color of urine, urgency or frequency. flank pain, hematuria  MS: joint pain or swelling. decreased range of motion  Psych: change in mood or affect. depression or anxiety.  Neuro: difficulty with speech, weakness, numbness, ataxia  SUBJECTIVE:   VITAL SIGNS: Temp:  [98.1 F (36.7 C)-98.5 F (36.9 C)] 98.5 F (36.9 C) (02/09 1000) Pulse Rate:  [65-76] 76 (02/09 1000) Resp:  [19-20] 19 (02/09 1000) BP: (122-148)/(43-57) 122/43 mmHg (02/09 1000) SpO2:  [93 %-96 %] 94 % (02/09 1000) Weight:  [91.944 kg (202 lb 11.2 oz)] 91.944 kg (202 lb 11.2 oz) (02/09 0436)  PHYSICAL EXAMINATION: General:  Male of normal body habitus in NAD Neuro:  Alert, oriented, non-focal HEENT:  Rafael Hernandez/AT, PERRL, no JVD Cardiovascular:  IRIR, no MRG, BLE edema +2 pitting to knees Lungs:  Scant bibasilar crackles L>R Abdomen:  Soft, non-tender,  non-distended Musculoskeletal:  No acute ROM limitation or deformity Skin:  Red shiny BLE   Recent Labs Lab 11/21/15 0336 11/22/15 0608 11/23/15 0547  NA 132* 135 136  K 3.9 4.1 3.8  CL 93* 95* 98*  CO2 29 31 31   BUN 28* 25* 21*  CREATININE 1.37* 1.27* 1.17  GLUCOSE 103* 104* 95    Recent Labs Lab 11/19/15 1225  HGB 12.3*  HCT 36.0*  WBC 7.4  PLT 138*   No results found.  ASSESSMENT / PLAN:  Cough - Benjamin Kyne has had cough for about a month now, predating his cruise. He was given Azithromycin under suspicion of PNA, however, cough did not improve. CXR this AM with no focal consolidation, and without fevers concern for PNA is relatively low. Will assess WBC. More notably, he complains of nasal/sinus congestion for a "long time" which is hard and he frequently needs to pick and blow his nose, which produces dark mucous with bright red blood, much more so than is seen with his cough. This is the most likely source of his productive cough, especially seeing as he is an warfarin, which has been supratherapeutic. He also has a history of esophageal strictures and due to this has risk of aspiration, despite having no subjective complaints of reflux.   Plan: - Cough suppression as ordered by primary team - Nasal saline washing - Nasal steroids - Antihistamine (Cl[place]atin formulary) - PPI - Avoid coughing as able, discussed with patient - Consider barium swallow to assess aspiration component , can be done outpatient - Will require pulmonary follow up as outpatient  Acute on chronic CHF Atrial fibrillation - persistent Hyperthyroidism - Management per primary team   Please discharge on nasal saline washings, nasal steroids, antihistamine, and PPI when timing deemed appropriate by primary team.   Pulmonary follow up arranged 2/23 2:15PM [facility] with Rexene Edison, NP  PCCM will sign off, please call if further assistance required   Georgann Housekeeper, AGACNP-BC [place]  Pulmonology/Critical Care Pager [phone] or [phone]  11/23/2015 12:33 PM     Attending:  I have seen and examined the patient with Georgann Housekeeper our nurse practitioner and agree with the findings from his note.  Briefly, this is a 70 year old male with systolic heart failure who came to our hospital with leg swelling and hypoxemic respiratory failure secondary to pulmonary edema. He had recently been on a 14 day cruise which led to the acute systolic heart failure exacerbation. He notes he's had a cough for the last several weeks. This has been persistent now that his pulmonary edema and shortness of breath have improved. He has postnasal drip and he has some intermittent dysphagia with known esophageal strictures. Next line On exam to crackles in bases bilaterally, normal effort no wheezing, Cardiovascular: Irregularly irregular no murmurs gallops rubs GI: Bowel sounds positive nontender nondistended  Chest x-ray images personally reviewed showing normal pulmonary parenchyma without pulmonary edema or lung mass  Cough: Subacute, I think that this is likely due to irritable larynx from ongoing coughing, postnasal drip and likely some degree of acid reflux. He does not have evidence of a pulmonary abnormality right now to explain this. None of his medications would explain the cough. There is no evidence of amiodarone-induced lung toxicity. Plan: Start nasal rinses Start Nasonex Start Zyrtec Start pantoprazole I instructed him today to try to suppress the cough is much as possible and to rest his voice Continued cough suppressive therapy  Follow-up with pulmonary in clinic, if no improvement then consider upper GI/barium swallow  Pulmonary and critical care medicine will sign off  Roselie Awkward, MD [place] PCCM Pager: [phone] Cell: [phone] After 3pm or if no response, call [phone]

## 2015-11-23 NOTE — Care Management Important Message (Signed)
Important Message  Patient Details  Name: Benjamin Mckay MRN: [id number] Date of Birth: Oct 19, 1945   Medicare Important Message Given:  Yes    Loann Quill 11/23/2015, 8:18 AM

## 2015-11-23 NOTE — Progress Notes (Signed)
ANTICOAGULATION CONSULT NOTE - Follow Up Consult  Pharmacy Consult for Coumadin Indication: atrial fibrillation  Patient Measurements: Height: 5\' 9"  (175.3 cm) Weight: 202 lb 11.2 oz (91.944 kg) IBW/kg (Calculated) : 70.7  Vital Signs: Temp: 98.2 F (36.8 C) (02/09 0700) Temp Source: Oral (02/09 0700) BP: 125/56 mmHg (02/09 0700) Pulse Rate: 71 (02/09 0800)  Labs:  Recent Labs  11/21/15 0336 11/22/15 0608 11/23/15 0547  LABPROT 30.6* 35.9* 36.6*  INR 3.00* 3.71* 3.81*  CREATININE 1.37* 1.27* 1.17    Estimated Creatinine Clearance: 66.8 mL/min (by C-G formula based on Cr of 1.17).  Assessment: 70 y/o male on warfarin for Afib. 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 INR continuous to fluctuate and will need to be monitored closely while he is on methimazole. INR continues to increase 3 > 3.7 > 3.8 today. Generally when patients are on methimazole, the metabolism of Vitamin K dependent clotting factors is reduced, making the patient require more warfarin. Please monitor closely.  Home warfarin regimen: 1.25 mg on Mondays and Fridays, 2.5 mg on all other days.  Goal of Therapy:  INR 2-3 Monitor platelets by anticoagulation protocol: Yes   Plan:   Hold warfarin tonight  Daily PT/INR   Monitor for s/sx bleeding    Hughes Better, PharmD, BCPS Clinical Pharmacist Pager: [phone] 11/23/2015 9:14 AM

## 2015-11-24 ENCOUNTER — Other Ambulatory Visit: Payer: Self-pay | Admitting: Physician Assistant

## 2015-11-24 DIAGNOSIS — I5023 Acute on chronic systolic (congestive) heart failure: Secondary | ICD-10-CM

## 2015-11-24 DIAGNOSIS — R05 Cough: Secondary | ICD-10-CM | POA: Diagnosis not present

## 2015-11-24 LAB — BASIC METABOLIC PANEL
ANION GAP: 8 (ref 5–15)
BUN: 20 mg/dL (ref 6–20)
CALCIUM: 8.5 mg/dL — AB (ref 8.9–10.3)
CO2: 30 mmol/L (ref 22–32)
Chloride: 97 mmol/L — ABNORMAL LOW (ref 101–111)
Creatinine, Ser: 1.16 mg/dL (ref 0.61–1.24)
GFR calc non Af Amer: >60 mL/min (ref >60)
Glucose, Bld: 96 mg/dL (ref 65–99)
POTASSIUM: 3.9 mmol/L (ref 3.5–5.1)
Sodium: 135 mmol/L (ref 135–145)

## 2015-11-24 LAB — PROTIME-INR
INR: 3.88 — ABNORMAL HIGH (ref 0.00–1.49)
PROTHROMBIN TIME: 37.1 s — AB (ref 11.6–15.2)

## 2015-11-24 LAB — THYROID STIMULATING IMMUNOGLOBULIN: THYROID STIMULATING IMMUNOGLOB: 54 % (ref 0–139)

## 2015-11-24 MED ORDER — METHIMAZOLE 10 MG PO TABS: 30.0000 mg | ORAL_TABLET | Freq: Every day | ORAL | Status: DC

## 2015-11-24 MED ORDER — BENZONATATE 200 MG PO CAPS: 200.0000 mg | ORAL_CAPSULE | Freq: Three times a day (TID) | ORAL | Status: DC | PRN

## 2015-11-24 MED ORDER — SPIRONOLACTONE 25 MG PO TABS: 12.5000 mg | ORAL_TABLET | Freq: Every day | ORAL | Status: DC

## 2015-11-24 MED ORDER — CETIRIZINE HCL 5 MG PO TABS: 5.0000 mg | ORAL_TABLET | Freq: Every day | ORAL | Status: DC

## 2015-11-24 MED ORDER — FUROSEMIDE 40 MG PO TABS
40.0000 mg | ORAL_TABLET | Freq: Every day | ORAL | Status: DC
  Administered 2015-11-24: 40 mg via ORAL
  Filled 2015-11-24: qty 1

## 2015-11-24 MED ORDER — MOMETASONE FUROATE 50 MCG/ACT NA SUSP: 2.0000 | Freq: Every day | NASAL | Status: DC

## 2015-11-24 MED ORDER — METOPROLOL SUCCINATE ER 25 MG PO TB24: 25.0000 mg | ORAL_TABLET | Freq: Every day | ORAL | Status: DC

## 2015-11-24 MED ORDER — POTASSIUM CHLORIDE CRYS ER 20 MEQ PO TBCR: 20.0000 meq | EXTENDED_RELEASE_TABLET | Freq: Every day | ORAL | Status: DC

## 2015-11-24 MED ORDER — PANTOPRAZOLE SODIUM 40 MG PO TBEC: 40.0000 mg | DELAYED_RELEASE_TABLET | Freq: Every day | ORAL | Status: DC

## 2015-11-24 MED ORDER — GUAIFENESIN-CODEINE 100-10 MG/5ML PO SOLN: 10.0000 mL | Freq: Four times a day (QID) | ORAL | Status: DC | PRN

## 2015-11-24 MED ORDER — FUROSEMIDE 40 MG PO TABS: 40.0000 mg | ORAL_TABLET | Freq: Every day | ORAL | Status: DC

## 2015-11-24 NOTE — Progress Notes (Signed)
Patient Name:  Benjamin Mckay, DOB: 1946/03/17, MRN: [id number] Primary Doctor: Robyn Haber, MD Primary Cardiologist:   Date: 11/24/2015   SUBJECTIVE:  The patient's feeling much better. He is now diuresed well. We have received good input from the pulmonary team.   Past Medical History  Diagnosis Date  . Atrial fibrillation ([place])   . Lymphoma ([place]) 1998    of colon   . Diverticulosis   . Esophageal stricture   . Hyperlipidemia   . Hypertension   . CVA (cerebral infarction)   . Colon polyps   . Urinary frequency   . Umbilical hernia   . Congenital heart block   . CHB (complete heart block) ([place]) 11/06/2013  . Cardiomyopathy, ischemic 11/07/2013  . Chronic combined systolic and diastolic CHF (congestive heart failure) ([place])   . Hyperthyroidism 11/16/2015   Filed Vitals:   11/23/15 1400 11/23/15 2109 11/24/15 0645 11/24/15 0900  BP: 112/40 129/48 136/54 125/50  Pulse: 65 65 65 74  Temp: 97.3 F (36.3 C) 97.9 F (36.6 C) 98.6 F (37 C) 98.1 F (36.7 C)  TempSrc: Oral Oral Oral Oral  Resp: 20 18 20 20   Height:      Weight:   198 lb 1.6 oz (89.858 kg)   SpO2: 98% 97% 96% 94%    Intake/Output Summary (Last 24 hours) at 11/24/15 1041 Last data filed at 11/24/15 0908  Gross per 24 hour  Intake   1080 ml  Output   1820 ml  Net   -740 ml   Filed Weights   11/22/15 0412 11/23/15 0436 11/24/15 0645  Weight: 208 lb 1.6 oz (94.394 kg) 202 lb 11.2 oz (91.944 kg) 198 lb 1.6 oz (89.858 kg)     LABS: Basic Metabolic Panel:  Recent Labs  11/23/15 0547 11/24/15 0425  NA 136 135  K 3.8 3.9  CL 98* 97*  CO2 31 30  GLUCOSE 95 96  BUN 21* 20  CREATININE 1.17 1.16  CALCIUM 8.6* 8.5*   Liver Function Tests: No results for input(s): AST, ALT, ALKPHOS, BILITOT, PROT, ALBUMIN in the last 72 hours. No results for input(s): LIPASE, AMYLASE in the last 72 hours. CBC: No results for input(s): WBC, NEUTROABS, HGB, HCT, MCV, PLT in the last 72 hours. Cardiac  Enzymes: No results for input(s): CKTOTAL, CKMB, CKMBINDEX, TROPONINI in the last 72 hours. BNP: Invalid input(s): POCBNP D-Dimer: No results for input(s): DDIMER in the last 72 hours. Thyroid Function Tests: No results for input(s): TSH, T4TOTAL, T3FREE, THYROIDAB in the last 72 hours.  Invalid input(s): FREET3  RADIOLOGY: Dg Chest 2 View  11/23/2015  CLINICAL DATA:  Productive cough onset today with intermittent bloody sputum; hx a-fib, HTN EXAM: CHEST  2 VIEW COMPARISON:  Multiple prior studies including most recent 11/17/2015 FINDINGS: Left-sided transvenous pacemaker with leads to the right atrium and right ventricle. Long-term stability of orphan lead. The heart is enlarged. There are no focal consolidations or pleural effusions. No pulmonary edema. IMPRESSION: Cardiomegaly. Electronically Signed   By: Nolon Nations M.D.   On: 11/23/2015 13:24   Dg Chest 2 View  11/17/2015  CLINICAL DATA:  Cough EXAM: CHEST  2 VIEW COMPARISON:  11/13/2015 FINDINGS: Left subclavian pacemaker device and leads are stable. Bibasilar hazy airspace disease has improved. Lung volumes have improved. No pneumothorax. Small pleural effusions are suspected. IMPRESSION: Improved bibasilar airspace disease. Small pleural effusions are suspected. Electronically Signed   By: Marybelle Killings M.D.   On: 11/17/2015 14:11  Dg Chest 2 View  11/13/2015  CLINICAL DATA:  Persistent cough since 10/27/2015 EXAM: CHEST  2 VIEW COMPARISON:  Chest x-ray 10/27/2015. FINDINGS: The heart is mildly enlarged but stable. The pacer wires are stable. Bibasilar opacity suspicious for pneumonia with probable small bilateral pleural effusions. The bony thorax is intact. IMPRESSION: Bibasilar infiltrates. Electronically Signed   By: Marijo Sanes M.D.   On: 11/13/2015 11:59   Dg Chest 2 View  10/27/2015  CLINICAL DATA:  Congestive heart failure.  Cough. EXAM: CHEST  2 VIEW COMPARISON:  10/25/2015 FINDINGS: Dual lead pacer noted. There is an  abandoned pacer lead extending to the right atrium which is discontinuous superiorly and does not appear attached to the pulse generator. Mild enlargement of the cardiopericardial silhouette noted without edema. Persistent vague density in the right upper lobe is nonspecific but could represent some resolving edema or faint pneumonia. IMPRESSION: 1. Stable appearance of indistinct density in the right upper lobe, possibly from low-level edema or pneumonia. Follow up to clearance is recommended -consider radiography in 4 weeks time. 2. Mild enlargement of the cardiopericardial silhouette. Electronically Signed   By: Van Clines M.D.   On: 10/27/2015 10:25   US Soft Tissue Head/neck  11/19/2015  CLINICAL DATA:  Hyperthyroidism due to amiodarone. EXAM: THYROID ULTRASOUND TECHNIQUE: Ultrasound examination of the thyroid gland and adjacent soft tissues was performed. COMPARISON:  None. FINDINGS: Right thyroid lobe Measurements: 6.3 x 3.0 x 2.0 cm. No nodules visualized. Heterogeneous parenchyma is noted. Left thyroid lobe Measurements: 5.5 x 3.1 x 2.4 cm. No nodules visualized. Heterogeneous parenchyma is noted. Isthmus Thickness: 7 mm.  No nodules visualized. Lymphadenopathy None visualized. IMPRESSION: Heterogeneous parenchyma of both thyroid lobes is noted suggesting multinodular goiter or thyroiditis, but no dominant mass or nodule is noted. Electronically Signed   By: Marijo Conception, M.D.   On: 11/19/2015 07:05    PHYSICAL EXAM   patient is oriented to person time and place. Affect is normal. Lungs are clear. Respiratory effort is not labored. Cardiac exam reveals S1 and S2. Abdomen is soft. His edema now is only trace.  ASSESSMENT AND PLAN:     Acute on chronic systolic heart failure ([place])     The patient is significantly improved with significant diuresis. He was on only 20 mg of Lasix at home. I have switched him from IV Lasix to 40 mg of oral Lasix today and for a home dose. I discussed  carefully with him limiting his salt and fluid intake. He knows to weigh himself daily and bring the weights to his office visit. He needs an early post hospital CHF visit.    HLD   HTN (hypertension)   CAD (coronary artery disease)   Chronic atrial fibrillation (HCC)   CHB (complete heart block) (HCC)   Single chamber St. Jude pacemaker 2013   Cardiomyopathy, ischemic   Warfarin anticoagulation   On amiodarone therapy   Hyperthyroidism    Cough with sputum     Complete pulmonary evaluation was done yesterday with recommendations concerning his cough. Plan to arrange for post hospital pulmonary visit also. I feel this is important since he did have slight hemoptysis yesterday.  The patient is ready for discharge home today.   Dola Argyle 11/24/2015 10:41 AM

## 2015-11-24 NOTE — Discharge Summary (Signed)
Discharge Summary    Patient ID: Benjamin Mckay,  MRN: [id number], DOB/AGE: 1945/11/04 70 y.o.  Admit date: 11/13/2015 Discharge date: 11/24/2015  Primary Care Provider: Robyn Haber Primary Cardiologist: Dr. Sallyanne Kuster   Discharge Diagnoses    Principal Problem:   Acute on chronic systolic heart failure ([place]) Active Problems:   HLD   HTN (hypertension)   CAD (coronary artery disease)   Chronic atrial fibrillation (HCC)   CHB (complete heart block) ([place])   Single chamber St. Jude pacemaker 2013   Cardiomyopathy, ischemic   Warfarin anticoagulation   On amiodarone therapy   Hyperthyroidism   Cough with sputum   Allergies Allergies  Allergen Reactions  . Adhesive [Tape] Other (See Comments)    Skin irritation - please use paper tape  . Statins Other (See Comments)    Leg cramps.   Tolerates pravastatin.    Marland Kitchen Penicillins Rash    Has patient had a PCN reaction causing immediate rash, facial/tongue/throat swelling, SOB or lightheadedness with hypotension: Yes Has patient had a PCN reaction causing severe rash involving mucus membranes or skin necrosis: No Has patient had a PCN reaction that required hospitalization No Has patient had a PCN reaction occurring within the last 10 years: No If all of the above answers are "NO", then may proceed with Cephalosporin use.     History of Present Illness     10M with ischemic cardiomyopathy LVEF 30-35%, CAD, permanent atrial fibrillation, CHB s/p ICD, CVA, and NSVT here with acute on chronic systolic heart failure who was admitted from the office on 11/13/15 for SOB, LE edema and 30 lbs weight gain.   He admitted to dietary indiscretion while on a cruise. Since that time he had been noticing increased weight (208--> 228lbs), LE edema that extended into his scrotum and worsening SOB despite trying to double his home lasix. It was decided to admit him from the office for IV diuresis.   Hospital Course     Consultants: PCCM,  EP  1. Acute on chronic systolic HF: with 30 lbs volume overload, likely related to dietary indiscretion - Echo 11/15/2015 EF 45-50%, akinesis of anteroseptal myocardium, mild AR, PA peak pressure 86mmHg, moderate L pleural effusion. Note EF improved from previous 30-35% since early 2016.  - Net I/O since admission -19.5 L. Weight down from 232 to 198 lbs. Baseline 200 lbs. He is felt to be euvolemic. His creat improved with diuresis 1.67--> 1.16. Discharge weight 198 lbs.  - Continue Toprol XL 25mg  daily and spiro 12.5mg  daily. Will continue to hold off on Losartan 25mg  daily.. This can be added back as an outpatient if BP will allow. Will discharge on Lasix 40mg  daily and 62mEq Kdur. Will need BMET at follow up  2. ICM: with baseline EF 30-35%, now improved to 45-50%. Continue BB. Hold ARB until outpatient appointment.  3. Permanent afib: Continue amiodarone, metoprolol and warfarin (currently on hold due to supratheraputic INR) - markedly hyperthyroid free T3 12.6, free T4 > 5.5, TSH 0.04. Likely related to amiodarone toxicity. However amiodarone has also been suppressing his symptomatic NSVT.  - EP consulted on 11/16/2015 recommend continue amiodarone given the benefit, and treat hyperthyroid.  - INR is 3.88 (likely due to Methimazole addition) and coumadin has been held ( will arrange coumadin clinic appointment on 11/27/15).   4. CAD: no angina. Will stop ASA in the setting of long term coumadin use.   5. H/o CHB: s/p St Jude PPM since 1984, pacemaker dependent - although  he may benefit from upgrading to BiV ICD, however he has multiple previous PPM implantations, at high risk for infection. Decided to manage conservatively. Seen by EP on 11/16/2015, did not recommend lead extraction or insertion of BiV ICD at this point.  6. Hyperthyroidism: likely related to amiodarone.   -EP consulted on 2/2, recommend treat hyperthyroidism and maintain amio given its benefit. No obvious alternative to amiodarone as it is used to suppress symptomatic NSVT -Per Dr. Buddy Duty 11/20/2015, [place] results discussed with patient and wife: ultrasound report and ultrasound images showed enlarged thyroid gland, heterogenous. Awaiting result of thyroid stimulating immunoglobulin test to distinguish underlying Grave's disease vs Hashimoto's thyroiditis. Continue methimazole 30 mg po daily, divided three times a day during hospital stay, but changed to three 10 mg tablets once a day at discharge Continue BB.  - The patient was instructed to call [phone] to schedule the following visits at [place] [facility], [facility], [place], [place], [place]: 1. Lab visit in one week for CBC with differential and hepatic function panel. 2. Lab visit in 3 weeks for TSH and free T4 level. 3. Office visit with Dr. Buddy Duty in 3 or 4 weeks.  - I have called Dr. Cindra Eves office to get this arranged and also put in the patients discharge instructions.   7. Cough w/ blood tinged sputum: -Cough did not improved with diuresis: PCCM conculted when he had tan blood tinged sputum. They recommended starting nasal rinses, Nasonex, Zyrtec and pantoprazole. He was told to try to suppress the cough is much as possible and to rest his voice. I have added these prescriptions at discharge. He will see PCCM in the outpatient setting in follow up as well.    The patient has had an uncomplicated hospital course and is recovering well. He has been seen by Dr. Ron Parker today and deemed ready for discharge home. All follow-up appointments have been scheduled. Discharge medications are listed below.  _____________  Discharge Vitals Blood pressure 127/52, pulse 65, temperature  98.1 F (36.7 C), temperature source Oral, resp. rate 20, height 5\' 9"  (1.753 m), weight 198 lb 1.6 oz (89.858 kg), SpO2 97 %.  Filed Weights   11/22/15 0412 11/23/15 0436 11/24/15 0645  Weight: 208 lb 1.6 oz (94.394 kg) 202 lb 11.2 oz (91.944 kg) 198 lb 1.6 oz (89.858 kg)    Labs & Radiologic Studies     CBC No results for input(s): WBC, NEUTROABS, HGB, HCT, MCV, PLT in the last 72 hours. Basic Metabolic Panel  Recent Labs  11/23/15 0547 11/24/15 0425  NA 136 135  K 3.8 3.9  CL 98* 97*  CO2 31 30  GLUCOSE 95 96  BUN 21* 20  CREATININE 1.17 1.16  CALCIUM 8.6* 8.5*     Dg Chest 2 View  11/23/2015  CLINICAL DATA:  Productive cough onset today with intermittent bloody sputum; hx a-fib, HTN EXAM: CHEST  2 VIEW COMPARISON:  Multiple prior studies including most recent 11/17/2015 FINDINGS: Left-sided transvenous pacemaker with leads to the right atrium and right ventricle. Long-term stability of orphan lead. The heart is enlarged. There are no focal consolidations or pleural effusions. No pulmonary edema. IMPRESSION: Cardiomegaly. Electronically Signed   By: Nolon Nations M.D.   On: 11/23/2015 13:24   Dg Chest 2 View  11/17/2015  CLINICAL DATA:  Cough EXAM: CHEST  2 VIEW COMPARISON:  11/13/2015 FINDINGS: Left subclavian pacemaker device and leads are stable. Bibasilar hazy airspace disease has improved. Lung  volumes have improved. No pneumothorax. Small pleural effusions are suspected. IMPRESSION: Improved bibasilar airspace disease. Small pleural effusions are suspected. Electronically Signed   By: Marybelle Killings M.D.   On: 11/17/2015 14:11   Dg Chest 2 View  11/13/2015  CLINICAL DATA:  Persistent cough since 10/27/2015 EXAM: CHEST  2 VIEW COMPARISON:  Chest x-ray 10/27/2015. FINDINGS: The heart is mildly enlarged but stable. The pacer wires are stable. Bibasilar opacity suspicious for pneumonia with probable small bilateral pleural effusions. The bony thorax is intact. IMPRESSION:  Bibasilar infiltrates. Electronically Signed   By: Marijo Sanes M.D.   On: 11/13/2015 11:59   Dg Chest 2 View  10/27/2015  CLINICAL DATA:  Congestive heart failure.  Cough. EXAM: CHEST  2 VIEW COMPARISON:  10/25/2015 FINDINGS: Dual lead pacer noted. There is an abandoned pacer lead extending to the right atrium which is discontinuous superiorly and does not appear attached to the pulse generator. Mild enlargement of the cardiopericardial silhouette noted without edema. Persistent vague density in the right upper lobe is nonspecific but could represent some resolving edema or faint pneumonia. IMPRESSION: 1. Stable appearance of indistinct density in the right upper lobe, possibly from low-level edema or pneumonia. Follow up to clearance is recommended -consider radiography in 4 weeks time. 2. Mild enlargement of the cardiopericardial silhouette. Electronically Signed   By: Van Clines M.D.   On: 10/27/2015 10:25   US Soft Tissue Head/neck  11/19/2015  CLINICAL DATA:  Hyperthyroidism due to amiodarone. EXAM: THYROID ULTRASOUND TECHNIQUE: Ultrasound examination of the thyroid gland and adjacent soft tissues was performed. COMPARISON:  None. FINDINGS: Right thyroid lobe Measurements: 6.3 x 3.0 x 2.0 cm. No nodules visualized. Heterogeneous parenchyma is noted. Left thyroid lobe Measurements: 5.5 x 3.1 x 2.4 cm. No nodules visualized. Heterogeneous parenchyma is noted. Isthmus Thickness: 7 mm.  No nodules visualized. Lymphadenopathy None visualized. IMPRESSION: Heterogeneous parenchyma of both thyroid lobes is noted suggesting multinodular goiter or thyroiditis, but no dominant mass or nodule is noted. Electronically Signed   By: Marijo Conception, M.D.   On: 11/19/2015 07:05     Diagnostic Studies/Procedures    2D ECHO: 11/15/2015 LV EF: 45% -   50% Study Conclusions - Left ventricle: The cavity size was normal. There was mild   concentric hypertrophy. Systolic function was mildly reduced. The    estimated ejection fraction was in the range of 45% to 50%. There   is akinesis of the anteroseptal myocardium. Doppler parameters   are consistent with high ventricular filling pressure. - Aortic valve: There was mild regurgitation. - Mitral valve: Calcified annulus. - Right ventricle: Pacer wire or catheter noted in right ventricle. - Right atrium: The atrium was mildly dilated. - Pulmonary arteries: Systolic pressure was moderately increased.   PA peak pressure: 54 mm Hg (S). - Pericardium, extracardiac: There was a moderate-sized left   pleural effusion.  _____________    Disposition   Pt is being discharged home today in good condition.  Follow-up Plans & Appointments    Follow-up Information    Follow up with Rexene Edison, NP On 12/07/2015.   Specialty:  Pulmonary Disease   Why:  2:15 PM, [facility] - 2nd floor   Contact information:   [address] [address] [phone]       Follow up with Tarri Fuller, PA-C On 12/04/2015.   Specialties:  Physician Assistant, Radiology, Interventional Cardiology   Why:  @ 1:30pm   Contact information:   [place]  [place] [address] [phone]       Follow up with KERR,Exavier Lina, MD.   Specialty:  Endocrinology   Why:  The office will call you to make an appoinment., If you do not hear from them, please contact them., You should be seen within 1-2 weeks.   Contact information:   [address]  [place] [address] [phone]       Follow up with [facility] On 11/27/2015.   Specialty:  Cardiology   Why:  @ 9:40am with Tommy Medal to check your INR.    Contact information:   [address] [place] [phone]     Discharge Instructions    AMB Referral to [facility] Management    Complete by:  As directed   Reason for consult:  HF follow up and monitoring  Diagnoses of:   Heart Failure Kidney Failure     Expected date of contact:  1-3 days (reserved for hospital discharges)  Please assign to community nurse for transition of care calls and assess for home visits. Patient will likely be appropriate for the Health Coach after the transition follow up. Questions please call:  Natividad Brood, RN BSN [facility] Liaison  [phone] business mobile phone Toll free office [phone]           Discharge Medications   Current Discharge Medication List    START taking these medications   Details  benzonatate (TESSALON) 200 MG capsule Take 1 capsule (200 mg total) by mouth 3 (three) times daily as needed for cough. Qty: 90 capsule, Refills: 1    cetirizine (ZYRTEC) 5 MG tablet Take 1 tablet (5 mg total) by mouth daily. Qty: 30 tablet, Refills: 11    guaiFENesin-codeine 100-10 MG/5ML syrup Take 10 mLs by mouth every 6 (six) hours as needed for cough. Qty: 120 mL, Refills: 0    methimazole (TAPAZOLE) 10 MG tablet Take 3 tablets (30 mg total) by mouth daily. Qty: 90 tablet, Refills: 1    metoprolol succinate (TOPROL-XL) 25 MG 24 hr tablet Take 1 tablet (25 mg total) by mouth daily. Qty: 30 tablet, Refills: 11   Associated Diagnoses: Acute on chronic systolic heart failure (HCC)    mometasone (NASONEX) 50 MCG/ACT nasal spray Place 2 sprays into the nose daily. Qty: 17 g, Refills: 12    pantoprazole (PROTONIX) 40 MG tablet Take 1 tablet (40 mg total) by mouth daily. Qty: 30 tablet, Refills: 11    spironolactone (ALDACTONE) 25 MG tablet Take 0.5 tablets (12.5 mg total) by mouth daily. Qty: 30 tablet, Refills: 11      CONTINUE these medications which have CHANGED   Details  furosemide (LASIX) 40 MG tablet Take 1 tablet (40 mg total) by mouth daily. Qty: 30 tablet, Refills: 11    potassium chloride SA (K-DUR,KLOR-CON) 20 MEQ tablet Take 1 tablet (20 mEq total) by mouth daily. Qty: 30 tablet, Refills: 11      CONTINUE these medications which have NOT CHANGED    Details  amiodarone (PACERONE) 200 MG tablet TAKE 1 TABLET (200 MG TOTAL) BY MOUTH DAILY. Qty: 90 tablet, Refills: 1    hydrocortisone cream 1 % Apply 1 application topically 2 (two) times daily as needed for itching.    Melatonin 5 MG TABS Take 5 mg by mouth at bedtime.    pravastatin (PRAVACHOL) 80 MG tablet Take 1 tablet (80 mg total) by mouth daily. Qty: 90 tablet,  Refills: 3    Tamsulosin HCl (FLOMAX) 0.4 MG CAPS Take 0.4 mg by mouth at bedtime.       STOP taking these medications     aspirin EC 81 MG tablet      Dextromethorphan Polistirex (DELSYM PO)      ibuprofen (ADVIL,MOTRIN) 200 MG tablet      losartan (COZAAR) 25 MG tablet      metoprolol (LOPRESSOR) 50 MG tablet      warfarin (COUMADIN) 2.5 MG tablet             Outstanding Labs/Studies   BMET at follow up. INR on 11/27/15  Duration of Discharge Encounter   Greater than 30 minutes including physician time.  Mable Fill R PA-C 11/24/2015, 12:01 PM Patient seen and examined. I agree with the assessment and plan as detailed above. See also my additional thoughts below.   See progress note. I made decision for discharge. I agree with note and plans.  Dola Argyle, MD, [facility] 11/27/2015 8:27 AM

## 2015-11-24 NOTE — Progress Notes (Signed)
ANTICOAGULATION CONSULT NOTE - Follow Up Consult  Pharmacy Consult for Coumadin Indication: atrial fibrillation  Patient Measurements: Height: 5\' 9"  (175.3 cm) Weight: 198 lb 1.6 oz (89.858 kg) IBW/kg (Calculated) : 70.7  Vital Signs: Temp: 98.6 F (37 C) (02/10 0645) Temp Source: Oral (02/10 0645) BP: 136/54 mmHg (02/10 0645) Pulse Rate: 65 (02/10 0645)  Labs:  Recent Labs  11/22/15 0608 11/23/15 0547 11/24/15 0425  LABPROT 35.9* 36.6* 37.1*  INR 3.71* 3.81* 3.88*  CREATININE 1.27* 1.17 1.16    Estimated Creatinine Clearance: 66.6 mL/min (by C-G formula based on Cr of 1.16).  Assessment: 70 y/o male on warfarin for Afib. 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 INR continuous to fluctuate and will need to be monitored closely while he is on methimazole. INR remains elevated, 3.88 today. Please monitor closely. Pt reports having blood-tinged sputum yesterday.  Home warfarin regimen: 1.25 mg on Mondays and Fridays, 2.5 mg on all other days.  Goal of Therapy:  INR 2-3 Monitor platelets by anticoagulation protocol: Yes   Plan:   Hold warfarin tonight  Daily PT/INR   Monitor for s/sx bleeding    Hughes Better, PharmD, BCPS Clinical Pharmacist Pager: [phone] 11/24/2015 8:06 AM

## 2015-11-27 ENCOUNTER — Telehealth: Payer: Self-pay | Admitting: *Deleted

## 2015-11-27 ENCOUNTER — Ambulatory Visit (INDEPENDENT_AMBULATORY_CARE_PROVIDER_SITE_OTHER): Payer: PPO | Admitting: Pharmacist Clinician (PhC)/ Clinical Pharmacy Specialist

## 2015-11-27 ENCOUNTER — Other Ambulatory Visit: Payer: Self-pay | Admitting: *Deleted

## 2015-11-27 ENCOUNTER — Encounter: Payer: Self-pay | Admitting: *Deleted

## 2015-11-27 DIAGNOSIS — Z7901 Long term (current) use of anticoagulants: Secondary | ICD-10-CM | POA: Diagnosis not present

## 2015-11-27 DIAGNOSIS — I4821 Permanent atrial fibrillation: Secondary | ICD-10-CM

## 2015-11-27 DIAGNOSIS — I4891 Unspecified atrial fibrillation: Secondary | ICD-10-CM | POA: Diagnosis not present

## 2015-11-27 DIAGNOSIS — I482 Chronic atrial fibrillation, unspecified: Secondary | ICD-10-CM

## 2015-11-27 LAB — POCT INR: INR: 3

## 2015-11-27 NOTE — Patient Outreach (Signed)
[facility]) Care Management  11/27/2015  Atlee Jelks [facility] 1946-08-12 [id number]  Transition of care d/c 11/23/2014 (refusal of services via [facility])  RN spoke with pt today and introduced the [facility] program and services. Verified pt's recent hospitalization and purpose of today's call. Discussed pt's medical condition and inquired on management of care. Pt reports his weights with a weight on yesterday of 196 lbs, today at 194 lbs and denies any swelling with no problems breathing (GREEN zone). Pt confirms adherence with taking all his medications with no reported issues or problems and has received the heart failure for his ongoing education on the HF zones. Note the transition of care template has been completed.  RN further extended an offer for community home visits for further education on his HF however pt decline. Pt has also declined a Engineer, maintenance and does not wish to continue transition of care calls over the next few weeks. Pt grateful for today's call but feels his is able to manage his care independently. Case will not be opened for [facility] services.  Raina Mina, RN Care Management Coordinator [place] Office [phone]

## 2015-11-27 NOTE — Telephone Encounter (Signed)
Letter done for wife's employer to stay out of work for one week after discharge from hospital.

## 2015-11-28 ENCOUNTER — Telehealth: Payer: Self-pay | Admitting: Cardiovascular Disease

## 2015-11-28 ENCOUNTER — Encounter: Payer: Self-pay | Admitting: *Deleted

## 2015-11-28 NOTE — Telephone Encounter (Signed)
Patients spouse Bracken Palleschi contacted office regarding work note dated 11/27/15 from Dr Sallyanne Kuster prepared by Janett Labella.  Mrs Stahr advises she no longer needs that note and her employer would like [place] to fax a letter to the  [facility] Dept voiding the letter for their records.

## 2015-11-28 NOTE — Telephone Encounter (Signed)
Opened phone note in error 

## 2015-11-28 NOTE — Telephone Encounter (Signed)
Patients spouse - Benay Spice requested letter prepared by B. Earlie Counts 11/27/15 for Dr Sallyanne Kuster fo

## 2015-11-29 LAB — THYROGLOBULIN LEVEL: THYROGLOBULIN: 159 ng/mL — AB

## 2015-12-01 ENCOUNTER — Other Ambulatory Visit: Payer: Self-pay

## 2015-12-01 DIAGNOSIS — Z5181 Encounter for therapeutic drug level monitoring: Secondary | ICD-10-CM | POA: Diagnosis not present

## 2015-12-01 DIAGNOSIS — E0580 Other thyrotoxicosis without thyrotoxic crisis or storm: Secondary | ICD-10-CM | POA: Diagnosis not present

## 2015-12-01 NOTE — Patient Outreach (Signed)
[facility]) Care Management  12/01/2015  Mylo [facility] Aug 01, 1946 [id number]   RED ON EMMI ALERT  Alert Date: 11/30/15 Alert Day #: Day # 6 Alert Reason: "Loss interest in things they used to enjoy? Yes"   Outreach attempt # 1 to patient. Patient reached. He denies any issues or concerns. States he is doing well. RN CM followed up on EMMI alert and education provided. Patient declines [facility] services. No needs or concerns at this time. Advised to call if anything changes.  Plan: RN CM will notify [facility] administrative assistant of patient's refusal.  Enzo Montgomery, RN,BSN,CCM [facility] Telephonic Care Management Coordinator Direct Phone: [phone] Toll Free: [phone] Fax: [phone]

## 2015-12-03 ENCOUNTER — Other Ambulatory Visit: Payer: Self-pay | Admitting: Cardiovascular Disease

## 2015-12-04 ENCOUNTER — Ambulatory Visit (INDEPENDENT_AMBULATORY_CARE_PROVIDER_SITE_OTHER): Payer: PPO | Admitting: Physician Assistant

## 2015-12-04 ENCOUNTER — Encounter: Payer: Self-pay | Admitting: Physician Assistant

## 2015-12-04 ENCOUNTER — Ambulatory Visit (INDEPENDENT_AMBULATORY_CARE_PROVIDER_SITE_OTHER): Payer: PPO | Admitting: Pharmacist Clinician (PhC)/ Clinical Pharmacy Specialist

## 2015-12-04 VITALS — BP 152/76 | HR 72 | Ht 69.0 in | Wt 191.2 lb

## 2015-12-04 DIAGNOSIS — Z7901 Long term (current) use of anticoagulants: Secondary | ICD-10-CM

## 2015-12-04 DIAGNOSIS — I482 Chronic atrial fibrillation, unspecified: Secondary | ICD-10-CM

## 2015-12-04 DIAGNOSIS — E785 Hyperlipidemia, unspecified: Secondary | ICD-10-CM | POA: Diagnosis not present

## 2015-12-04 DIAGNOSIS — E860 Dehydration: Secondary | ICD-10-CM | POA: Diagnosis not present

## 2015-12-04 DIAGNOSIS — R05 Cough: Secondary | ICD-10-CM

## 2015-12-04 DIAGNOSIS — R059 Cough, unspecified: Secondary | ICD-10-CM

## 2015-12-04 DIAGNOSIS — I4891 Unspecified atrial fibrillation: Secondary | ICD-10-CM

## 2015-12-04 DIAGNOSIS — I10 Essential (primary) hypertension: Secondary | ICD-10-CM

## 2015-12-04 DIAGNOSIS — I2510 Atherosclerotic heart disease of native coronary artery without angina pectoris: Secondary | ICD-10-CM

## 2015-12-04 DIAGNOSIS — I2583 Coronary atherosclerosis due to lipid rich plaque: Secondary | ICD-10-CM

## 2015-12-04 DIAGNOSIS — I4821 Permanent atrial fibrillation: Secondary | ICD-10-CM

## 2015-12-04 DIAGNOSIS — R600 Localized edema: Secondary | ICD-10-CM | POA: Diagnosis not present

## 2015-12-04 DIAGNOSIS — R609 Edema, unspecified: Secondary | ICD-10-CM | POA: Diagnosis not present

## 2015-12-04 DIAGNOSIS — Z950 Presence of cardiac pacemaker: Secondary | ICD-10-CM

## 2015-12-04 LAB — POCT INR: INR: 4.7

## 2015-12-04 MED ORDER — HYDROCODONE-CHLORPHENIRAMINE 5-4 MG/5ML PO SOLN: 5.0000 mL | Freq: Two times a day (BID) | ORAL | Status: DC | PRN

## 2015-12-04 NOTE — Progress Notes (Signed)
Patient ID: Benjamin Mckay, male   DOB: 1946/02/16, 70 y.o.   MRN: [id number]    Date:  12/04/2015   ID:  Benjamin Mckay, DOB April 30, 1946, MRN [id number]  PCP:  Benjamin Haber, MD  Primary Cardiologist:  Cro[place]   Chief Complaint  Patient presents with  . Follow-up     no chest pain, no shortness of breath, has edema, no pain or cramping in legs, no lightheadedness or dizziness     History of Present Illness: Benjamin Mckay is a 70 y.o. male with ischemic cardiomyopathy LVEF 30-35%, CAD, permanent atrial fibrillation, CHB s/p ICD, CVA, and NSVT here with acute on chronic systolic heart failure who was admitted from the office on 11/13/15 for SOB, LE edema and 30 lbs weight gain.   He admitted to dietary indiscretion while on a cruise. Since that time he had been noticing increased weight (208--> 228lbs), LE edema that extended into his scrotum and worsening SOB despite trying to double his home lasix. It was decided to admit him from the office for IV diuresis.   He was diuresed from 230-198 pounds. Creatinine improved from 1.67 1.16. Was continued on metoprolol XL 25 mg daily and spironolactone 12.5 mg daily.   Losartan was held at discharge. Lasix was continued at 40 mg daily with 20 MEQ  Of potassium.   He is hyperthyroid which is likely related to amiodarone and was started on methimazole 10 mg. Was also recommended he followed up with endocrinology.  He is now here for posthospital evaluation.  He reports reports feeling terrible in the way of being fatigued and tired. Continues to cough there is nausea associated with the cough. He had ordered function checked on the 17th and he said his labs were good. INR today is 4.7.  He also reports decreased appetite. It has lost an additional 7 pounds on our scale. On his scale is 189 pounds. He has not been sleeping at all however, he tried some of his wife's Tussionex one night and he slept very well..  Think the coughing is keeping him  up.  The patient currently denies vomiting, fever, chest pain, shortness of breath, orthopnea, dizziness, PND,  abdominal pain, hematochezia, melena, lower extremity edema, claudication.  Wt Readings from Last 3 Encounters:  12/04/15 191 lb 3 oz (86.722 kg)  11/24/15 198 lb 1.6 oz (89.858 kg)  11/13/15 238 lb (107.956 kg)     Past Medical History  Diagnosis Date  . Atrial fibrillation ([place])   . Lymphoma ([place]) 1998    of colon   . Diverticulosis   . Esophageal stricture   . Hyperlipidemia   . Hypertension   . CVA (cerebral infarction)   . Colon polyps   . Urinary frequency   . Umbilical hernia   . Congenital heart block   . CHB (complete heart block) ([place]) 11/06/2013  . Cardiomyopathy, ischemic 11/07/2013  . Chronic combined systolic and diastolic CHF (congestive heart failure) ([place])   . Hyperthyroidism 11/16/2015    Current Outpatient Prescriptions  Medication Sig Dispense Refill  . amiodarone (PACERONE) 200 MG tablet TAKE 1 TABLET (200 MG TOTAL) BY MOUTH DAILY. (Patient taking differently: TAKE 1 TABLET (200 MG TOTAL) BY MOUTH DAILY AT 2PM) 90 tablet 1  . benzonatate (TESSALON) 200 MG capsule Take 1 capsule (200 mg total) by mouth 3 (three) times daily as needed for cough. 90 capsule 1  . cetirizine (ZYRTEC) 5 MG tablet Take 1 tablet (5 mg total) by mouth daily.  30 tablet 11  . furosemide (LASIX) 40 MG tablet Take 1 tablet (40 mg total) by mouth daily. 30 tablet 11  . hydrocortisone cream 1 % Apply 1 application topically 2 (two) times daily as needed for itching.    . methimazole (TAPAZOLE) 10 MG tablet Take 3 tablets (30 mg total) by mouth daily. 90 tablet 1  . metoprolol succinate (TOPROL-XL) 25 MG 24 hr tablet Take 1 tablet (25 mg total) by mouth daily. 30 tablet 11  . mometasone (NASONEX) 50 MCG/ACT nasal spray Place 2 sprays into the nose daily. 17 g 12  . potassium chloride SA (K-DUR,KLOR-CON) 20 MEQ tablet Take 1 tablet (20 mEq total) by mouth daily. 30 tablet 11  .  spironolactone (ALDACTONE) 25 MG tablet Take 0.5 tablets (12.5 mg total) by mouth daily. 30 tablet 11  . Tamsulosin HCl (FLOMAX) 0.4 MG CAPS Take 0.4 mg by mouth at bedtime.     Marland Kitchen warfarin (COUMADIN) 2.5 MG tablet Take 1 tablet by mouth daily. Use as directed  1  . Hydrocodone-Chlorpheniramine 5-4 MG/5ML SOLN Take 5 mLs by mouth every 12 (twelve) hours as needed. 1 Bottle 0  . pravastatin (PRAVACHOL) 80 MG tablet TAKE 1 TABLET BY MOUTH EVERY DAY 90 tablet 3   No current facility-administered medications for this visit.    Allergies:    Allergies  Allergen Reactions  . Adhesive [Tape] Other (See Comments)    Skin irritation - please use paper tape  . Statins Other (See Comments)    Leg cramps.   Tolerates pravastatin.    Marland Kitchen Penicillins Rash    Has patient had a PCN reaction causing immediate rash, facial/tongue/throat swelling, SOB or lightheadedness with hypotension: Yes Has patient had a PCN reaction causing severe rash involving mucus membranes or skin necrosis: No Has patient had a PCN reaction that required hospitalization No Has patient had a PCN reaction occurring within the last 10 years: No If all of the above answers are "NO", then may proceed with Cephalosporin use.    Social History:  The patient  reports that he has never smoked. He does not have any smokeless tobacco history on file. He reports that he does not drink alcohol or use illicit drugs.   Family history:   Family History  Problem Relation Age of Onset  . Prostate cancer Father   . Colon cancer Neg Hx   . Diabetes Maternal Grandmother   . Heart disease Mother   . Stroke Mother   . Heart attack Neg Hx     ROS:  Please see the history of present illness.  All other systems reviewed and negative.   PHYSICAL EXAM: VS:  BP 152/76 mmHg  Pulse 72  Ht 5\' 9"  (1.753 m)  Wt 191 lb 3 oz (86.722 kg)  BMI 28.22 kg/m2 Well nourished, well developed, in no acute distress although he does appear lethargic and  fatigued. HEENT: Pupils are equal round react to light accommodation extraocular movements are intact. Conjunctiva are very pink and display good perfusion. Neck: no JVDNo cervical lymphadenopathy. Cardiac: Regular rate and rhythm without murmurs rubs or gallops. Lungs:  clear to auscultation bilaterally, no wheezing, rhonchi or rales Abd: soft, nontender, positive bowel sounds all quadrants, no hepatosplenomegaly Ext: He has 1+ tense pitting lower extremity edema.  2+ radial and dorsalis pedis pulses. Skin: warm and dry.  Very good capillary refill. Neuro:  Grossly normal  EKG:  V paced  ASSESSMENT AND PLAN:  Problem List Items Addressed This  Visit    Single chamber [employer] Jude pacemaker 2013   HTN (hypertension)   Relevant Medications   warfarin (COUMADIN) 2.5 MG tablet   HLD   Relevant Medications   warfarin (COUMADIN) 2.5 MG tablet   Dehydration - Primary   Relevant Orders   CBC   Basic metabolic panel   Brain natriuretic peptide   Cough   Chronic atrial fibrillation (HCC)   Relevant Medications   warfarin (COUMADIN) 2.5 MG tablet   CAD (coronary artery disease)   Relevant Medications   warfarin (COUMADIN) 2.5 MG tablet    Other Visit Diagnoses    Bilateral edema of lower extremity        Relevant Orders    EKG 12-Lead    CBC    Basic metabolic panel    Brain natriuretic peptide    Compression stockings      Mr. Oharrow looks a lot better as far as his volume status however, is complaining of severe fatigue and lethargy and just in general feels terrible. Is getting nauseated because of this persistent coughing that he's having he had one good night of sleep after taking his wife's Tussionex. I have prescribed this for him every 12 hours without refills. He is due to see pulmonary tomorrow. Coughing seems to be worse when he lays down. His lungs are clear on exam he lost an additional 7 pounds since discharge.   I do not think he is anemic since the conjunctiva are nice  and pink and he has good capillary refill.  I do think however, despite the lower extremity edema, that he may be intravascularly dry.  have stopped his Lasix and spironolactone until I get the results of the new basic metabolic panel a BNP and a CBC that I ordered.  Pulmonary thought that his coughing may be related to an irritated larynx or possibly acid reflux. He is on Protonix 40 mg a day. The Tessalon and guaifenesin and that helps her with discontinue those both.  He is scheduled for endocrinology and pulmonology follow-up soon.

## 2015-12-04 NOTE — Patient Instructions (Signed)
Medication Instructions: Benjamin Fuller, PA-C, has recommended making the following medication changes: You may take Tussionex - 5 mL by mouth every 12 hours as needed HOLD Lasix and Spironolactone - until you hear your lab results!!  Labwork: Your physician recommends that you return for lab work TODAY.  Testing/Procedures: NONE  Follow-up: Gaspar Bidding recommends that you schedule a follow-up appointment in 2 weeks.  If you need a refill on your cardiac medications before your next appointment, please call your pharmacy.

## 2015-12-04 NOTE — Telephone Encounter (Signed)
Rx(s) sent to pharmacy electronically.  

## 2015-12-05 ENCOUNTER — Telehealth: Payer: Self-pay | Admitting: *Deleted

## 2015-12-05 ENCOUNTER — Telehealth: Payer: Self-pay | Admitting: Cardiovascular Disease

## 2015-12-05 LAB — BASIC METABOLIC PANEL
BUN: 24 mg/dL (ref 7–25)
CHLORIDE: 101 mmol/L (ref 98–110)
CO2: 26 mmol/L (ref 20–31)
Calcium: 8.9 mg/dL (ref 8.6–10.3)
Creat: 0.85 mg/dL (ref 0.70–1.25)
GLUCOSE: 113 mg/dL — AB (ref 65–99)
POTASSIUM: 4.5 mmol/L (ref 3.5–5.3)
SODIUM: 135 mmol/L (ref 135–146)

## 2015-12-05 LAB — CBC
HEMATOCRIT: 41.1 % (ref 39.0–52.0)
HEMOGLOBIN: 13.5 g/dL (ref 13.0–17.0)
MCH: 30.9 pg (ref 26.0–34.0)
MCHC: 32.8 g/dL (ref 30.0–36.0)
MCV: 94.1 fL (ref 78.0–100.0)
MPV: 9.9 fL (ref 8.6–12.4)
Platelets: 269 10*3/uL (ref 150–400)
RBC: 4.37 MIL/uL (ref 4.22–5.81)
RDW: 13 % (ref 11.5–15.5)
WBC: 4.8 10*3/uL (ref 4.0–10.5)

## 2015-12-05 LAB — BRAIN NATRIURETIC PEPTIDE: Brain Natriuretic Peptide: 468.8 pg/mL — ABNORMAL HIGH (ref <100)

## 2015-12-05 MED ORDER — HYDROCOD POLST-CPM POLST ER 10-8 MG/5ML PO SUER: 5.0000 mL | Freq: Two times a day (BID) | ORAL | Status: DC | PRN

## 2015-12-05 NOTE — Telephone Encounter (Signed)
Pharmacist from [facility] called and stated that Hydrocodone-Chlorpheniramine does not come in the dose that was ordered. Please call [phone].

## 2015-12-05 NOTE — Telephone Encounter (Signed)
Received a call from pharmacist Shamir with [facility] calling to let Benjamin Fuller PA know Tussionex prescribed 12/04/15 does not come in 5-4 mg/5 ml,comes in 10-8 mg/97ml.Advised ok to give him 10-8 mg/75ml every 12 hrs as needed for cough 120 ml no refills.

## 2015-12-05 NOTE — Telephone Encounter (Signed)
Yes.  I think that will be fine.  Tarri Fuller PAC

## 2015-12-07 ENCOUNTER — Ambulatory Visit (INDEPENDENT_AMBULATORY_CARE_PROVIDER_SITE_OTHER): Payer: PPO | Admitting: Adult Health

## 2015-12-07 ENCOUNTER — Inpatient Hospital Stay: Admission: RE | Admit: 2015-12-07 | Payer: PPO | Source: Ambulatory Visit

## 2015-12-07 ENCOUNTER — Ambulatory Visit
Admission: RE | Admit: 2015-12-07 | Discharge: 2015-12-07 | Disposition: A | Discharge status: Home / Self Care | Payer: PPO | Source: Ambulatory Visit | Attending: Adult Health | Admitting: Adult Health

## 2015-12-07 ENCOUNTER — Encounter: Payer: Self-pay | Admitting: Adult Health

## 2015-12-07 ENCOUNTER — Other Ambulatory Visit (INDEPENDENT_AMBULATORY_CARE_PROVIDER_SITE_OTHER): Payer: PPO

## 2015-12-07 VITALS — BP 142/86 | HR 70 | Temp 98.8°F | Ht 69.0 in | Wt 194.0 lb

## 2015-12-07 DIAGNOSIS — Z79899 Other long term (current) drug therapy: Secondary | ICD-10-CM

## 2015-12-07 DIAGNOSIS — R059 Cough, unspecified: Secondary | ICD-10-CM

## 2015-12-07 DIAGNOSIS — R05 Cough: Secondary | ICD-10-CM

## 2015-12-07 DIAGNOSIS — R918 Other nonspecific abnormal finding of lung field: Secondary | ICD-10-CM | POA: Diagnosis not present

## 2015-12-07 DIAGNOSIS — I5020 Unspecified systolic (congestive) heart failure: Secondary | ICD-10-CM | POA: Diagnosis not present

## 2015-12-07 DIAGNOSIS — J90 Pleural effusion, not elsewhere classified: Secondary | ICD-10-CM | POA: Diagnosis not present

## 2015-12-07 LAB — SEDIMENTATION RATE: SED RATE: 50 mm/h — AB (ref 0–22)

## 2015-12-07 MED ORDER — HYDROCODONE-HOMATROPINE 5-1.5 MG/5ML PO SYRP: 5.0000 mL | ORAL_SOLUTION | Freq: Four times a day (QID) | ORAL | Status: DC | PRN

## 2015-12-07 NOTE — Assessment & Plan Note (Signed)
Recent flare now appears under imrpoved control without evidence of fluid overload on exam.  Cont on current regimen  follow up with cards as planned.

## 2015-12-07 NOTE — Assessment & Plan Note (Signed)
Ongoing cough unresponsive to therapy.  He is on amiodarone . Previous cxr without evidence of amio pulmonary toxicity- will  Check CT chest today , ESR Get  PFT on return(hopefully cough will be better he will be able to complete this )

## 2015-12-07 NOTE — Patient Instructions (Addendum)
Delsym 2 tsp Twice daily  As needed  Cough  Tessalon .Three times a day  As needed  Cough.  Hydromet 1 tsp every 6hr As needed  Cough , may make you sleepy  Restart Protonix 40mg  daily  Continue on zyrtec 10mg  daily  Add Chlortrimeton 4mg  2 At bedtime   Sips of water to avoid cough and throat clearing.  We are setting you up for a CT chest .  Labs today .  Please contact office for sooner follow up if symptoms do not improve or worsen or seek emergency care  Follow up with Dr. Lake Bells in 4 weeks and As needed with PFT

## 2015-12-07 NOTE — Assessment & Plan Note (Signed)
Cyclical cough ? Post viral cough  Pt is on amiodarone - will check ESR and CT chest . To r/o amio pulm toxicity  Check PFT  Cont w/ cough suppression regimen , restart GERD tx and add antihistamine. .   Plan  Delsym 2 tsp Twice daily  As needed  Cough  Tessalon .Three times a day  As needed  Cough.  Hydromet 1 tsp every 6hr As needed  Cough , may make you sleepy  Restart Protonix 23m daily  Continue on zyrtec 170mdaily  Add Chlortrimeton 34m334m At bedtime   Sips of water to avoid cough and throat clearing.  We are setting you up for a CT chest .  Labs today .  Please contact office for sooner follow up if symptoms do not improve or worsen or seek emergency care  Follow up with Dr. McQLake Bells 4 weeks and As needed

## 2015-12-07 NOTE — Progress Notes (Signed)
Complex situation, agree with current management.  GERD, sinus congestion treatment as well as getting PFT and f/u CT chest.

## 2015-12-07 NOTE — Progress Notes (Signed)
Subjective:    Patient ID: Benjamin Mckay, male    DOB: 02/13/1946, 70 y.o.   MRN: [id number]  HPI 70 yo male never smoker with chronic CHF seen for pulmonary consult for cough during hospitalization 11/2015 .  Has Chronic A-Fib on Amiodarone/coumadin , Ischemic CM w/ pacemaker  Echo EF 45-50%   12/07/2015 [facility] follow up  Pt returns for a post hospital follow up . He was recently admitted for decompensated systolic CHF . He had a 30lb wt increase prior to admission. He was agressively diuresised. W/ neg 18L .  Dyspnea and edema improved . However pt continued to have a persistent dry cough. And hemoptysis CXR showed no sign of PNA. Or acute process.  He was seen by pulmonary and treated with cough suppression , reflux regimen , rhinitis regimen .  He does have a hx of esophageal stricture . Denies GERD sx. Has occasional swallow issues with food sticking.  He says hemoptyiss resolved but cough is worse. Can not stop coughing. Using tessalon, delsym and tussionex without much help.  Denies discolored mucus or fever.  He was on recent cruise in [place] , flew to [place]  Prior to hospitalization. He had cough during this time. Cough has been present for ~1 month. INR review showed therapeutic throughout this time period.  Says leg swelling and dyspnea are better since discharge.    Past Medical History  Diagnosis Date  . Atrial fibrillation ([place])   . Lymphoma ([place]) 1998    of colon   . Diverticulosis   . Esophageal stricture   . Hyperlipidemia   . Hypertension   . CVA (cerebral infarction)   . Colon polyps   . Urinary frequency   . Umbilical hernia   . Congenital heart block   . CHB (complete heart block) ([place]) 11/06/2013  . Cardiomyopathy, ischemic 11/07/2013  . Chronic combined systolic and diastolic CHF (congestive heart failure) ([place])   . Hyperthyroidism 11/16/2015    Current Outpatient Prescriptions on File Prior to Visit  Medication Sig Dispense Refill  . amiodarone  (PACERONE) 200 MG tablet TAKE 1 TABLET (200 MG TOTAL) BY MOUTH DAILY. (Patient taking differently: TAKE 1 TABLET (200 MG TOTAL) BY MOUTH DAILY AT 2PM) 90 tablet 1  . benzonatate (TESSALON) 200 MG capsule Take 1 capsule (200 mg total) by mouth 3 (three) times daily as needed for cough. 90 capsule 1  . cetirizine (ZYRTEC) 5 MG tablet Take 1 tablet (5 mg total) by mouth daily. 30 tablet 11  . chlorpheniramine-HYDROcodone (TUSSIONEX PENNKINETIC ER) 10-8 MG/5ML SUER Take 5 mLs by mouth every 12 (twelve) hours as needed for cough. 120 mL 0  . furosemide (LASIX) 40 MG tablet Take 1 tablet (40 mg total) by mouth daily. 30 tablet 11  . methimazole (TAPAZOLE) 10 MG tablet Take 3 tablets (30 mg total) by mouth daily. 90 tablet 1  . metoprolol succinate (TOPROL-XL) 25 MG 24 hr tablet Take 1 tablet (25 mg total) by mouth daily. 30 tablet 11  . mometasone (NASONEX) 50 MCG/ACT nasal spray Place 2 sprays into the nose daily. 17 g 12  . potassium chloride SA (K-DUR,KLOR-CON) 20 MEQ tablet Take 1 tablet (20 mEq total) by mouth daily. 30 tablet 11  . pravastatin (PRAVACHOL) 80 MG tablet TAKE 1 TABLET BY MOUTH EVERY DAY 90 tablet 3  . Tamsulosin HCl (FLOMAX) 0.4 MG CAPS Take 0.4 mg by mouth at bedtime.     Marland Kitchen warfarin (COUMADIN) 2.5 MG tablet Take  1 tablet by mouth daily. Use as directed  1  . hydrocortisone cream 1 % Apply 1 application topically 2 (two) times daily as needed for itching. Reported on 12/07/2015    . spironolactone (ALDACTONE) 25 MG tablet Take 0.5 tablets (12.5 mg total) by mouth daily. (Patient not taking: Reported on 12/07/2015) 30 tablet 11   No current facility-administered medications on file prior to visit.      Review of Systems Constitutional:   No  weight loss, night sweats,  Fevers, chills, fatigue, or  lassitude.  HEENT:   No headaches,  Difficulty swallowing,  Tooth/dental problems, or  Sore throat,                No sneezing, itching, ear ache,  +nasal congestion, post nasal drip,    CV:  No chest pain,  Orthopnea, PND, swelling in lower extremities, anasarca, dizziness, palpitations, syncope.   GI  No heartburn, indigestion, abdominal pain, nausea, vomiting, diarrhea, change in bowel habits, loss of appetite, bloody stools.   Resp:    No chest wall deformity  Skin: no rash or lesions.  GU: no dysuria, change in color of urine, no urgency or frequency.  No flank pain, no hematuria   MS:  No joint pain or swelling.  No decreased range of motion.  No back pain.  Psych:  No change in mood or affect. No depression or anxiety.  No memory loss.         Objective:   Physical Exam Filed Vitals:   12/07/15 1417  Pulse: 70  Temp: 98.8 F (37.1 C)  TempSrc: Oral  Height: 5\' 9"  (1.753 m)  Weight: 194 lb (87.998 kg)  SpO2: 95%   GEN: A/Ox3; pleasant , NAD, elderly , ++dry cough   HEENT:  [place]/AT,  EACs-clear, TMs-wnl, NOSE-clear, THROAT-clear, no lesions, no postnasal drip or exudate noted.   NECK:  Supple w/ fair ROM; no JVD; normal carotid impulses w/o bruits; no thyromegaly or nodules palpated; no lymphadenopathy.  RESP  Decreased BS in bases no accessory muscle use, no dullness to percussion  CARD:  RRR, no m/r/g  , 1+ peripheral edema, pulses intact, no cyanosis or clubbing.  GI:   Soft & nt; nml bowel sounds; no organomegaly or masses detected.  Musco: Warm bil, no deformities or joint swelling noted.   Neuro: alert, no focal deficits noted.    Skin: Warm, no lesions or rashes        Assessment & Plan:

## 2015-12-08 ENCOUNTER — Telehealth: Payer: Self-pay | Admitting: Adult Health

## 2015-12-08 DIAGNOSIS — R911 Solitary pulmonary nodule: Secondary | ICD-10-CM

## 2015-12-08 NOTE — Telephone Encounter (Signed)
Patient notified of CT results and lab resutls. Order entered for follow up CT. Nothing further needed.

## 2015-12-08 NOTE — Telephone Encounter (Signed)
Spoke with patient's wife-she is aware that we are sending message to TP and will get a call back. Wife understands that TP is still working clinic this afternoon.

## 2015-12-08 NOTE — Telephone Encounter (Signed)
See result notes for CT chest

## 2015-12-11 ENCOUNTER — Telehealth: Payer: Self-pay | Admitting: Adult Health

## 2015-12-11 NOTE — Telephone Encounter (Signed)
Spoke with pt's spouse regarding ct chest results and recs.  All questions were answered.  Nothing further needed.

## 2015-12-12 ENCOUNTER — Telehealth: Payer: Self-pay | Admitting: *Deleted

## 2015-12-12 ENCOUNTER — Encounter: Payer: Self-pay | Admitting: Cardiovascular Disease

## 2015-12-12 ENCOUNTER — Ambulatory Visit (INDEPENDENT_AMBULATORY_CARE_PROVIDER_SITE_OTHER): Payer: PPO | Admitting: Pharmacist Clinician (PhC)/ Clinical Pharmacy Specialist

## 2015-12-12 ENCOUNTER — Ambulatory Visit (INDEPENDENT_AMBULATORY_CARE_PROVIDER_SITE_OTHER): Payer: PPO | Admitting: Cardiovascular Disease

## 2015-12-12 VITALS — BP 110/56 | HR 64 | Ht 69.0 in | Wt 191.0 lb

## 2015-12-12 DIAGNOSIS — I4821 Permanent atrial fibrillation: Secondary | ICD-10-CM

## 2015-12-12 DIAGNOSIS — E785 Hyperlipidemia, unspecified: Secondary | ICD-10-CM

## 2015-12-12 DIAGNOSIS — I482 Chronic atrial fibrillation, unspecified: Secondary | ICD-10-CM

## 2015-12-12 DIAGNOSIS — Z950 Presence of cardiac pacemaker: Secondary | ICD-10-CM

## 2015-12-12 DIAGNOSIS — I442 Atrioventricular block, complete: Secondary | ICD-10-CM

## 2015-12-12 DIAGNOSIS — Z7901 Long term (current) use of anticoagulants: Secondary | ICD-10-CM | POA: Diagnosis not present

## 2015-12-12 DIAGNOSIS — E7800 Pure hypercholesterolemia, unspecified: Secondary | ICD-10-CM | POA: Insufficient documentation

## 2015-12-12 DIAGNOSIS — I4891 Unspecified atrial fibrillation: Secondary | ICD-10-CM

## 2015-12-12 DIAGNOSIS — Z79899 Other long term (current) drug therapy: Secondary | ICD-10-CM

## 2015-12-12 DIAGNOSIS — I5023 Acute on chronic systolic (congestive) heart failure: Secondary | ICD-10-CM

## 2015-12-12 DIAGNOSIS — I6521 Occlusion and stenosis of right carotid artery: Secondary | ICD-10-CM

## 2015-12-12 DIAGNOSIS — I2510 Atherosclerotic heart disease of native coronary artery without angina pectoris: Secondary | ICD-10-CM

## 2015-12-12 DIAGNOSIS — I255 Ischemic cardiomyopathy: Secondary | ICD-10-CM | POA: Diagnosis not present

## 2015-12-12 DIAGNOSIS — E0590 Thyrotoxicosis, unspecified without thyrotoxic crisis or storm: Secondary | ICD-10-CM

## 2015-12-12 DIAGNOSIS — Z8679 Personal history of other diseases of the circulatory system: Secondary | ICD-10-CM

## 2015-12-12 DIAGNOSIS — I6523 Occlusion and stenosis of bilateral carotid arteries: Secondary | ICD-10-CM

## 2015-12-12 LAB — POCT INR: INR: 6

## 2015-12-12 MED ORDER — AMIODARONE HCL 200 MG PO TABS: 100.0000 mg | ORAL_TABLET | Freq: Every day | ORAL | Status: DC

## 2015-12-12 MED ORDER — FUROSEMIDE 20 MG PO TABS: ORAL_TABLET | ORAL | Status: DC

## 2015-12-12 NOTE — Progress Notes (Signed)
Patient ID: Benjamin Mckay, male   DOB: April 13, 1946, 70 y.o.   MRN: [id number]    Cardiology Office Note    Date:  12/12/2015   ID:  Benjamin Mckay, DOB 1946/08/20, MRN [id number]  PCP:  Robyn Haber, MD  Cardiologist:   Sanda Klein, MD   Chief Complaint  Patient presents with  . [place]  . Edema    History of Present Illness:  Benjamin Mckay is a 70 y.o. male  here for follow-up of ischemic cardiomyopathy, systolic heart failure with recent acute exacerbation requiring hospitalization, permanent atrial fibrillation (versus atrial standstill), complete heart block/pacemaker dependent, history of sustained ventricular tachycardia on chronic amiodarone therapy, recently diagnosed hyperthyroidism (likely related to amiodarone).  Since discharge from the hospital a couple weeks ago his weight has continued to decrease and is now down to 190-194 pounds, by far the lowest weight he has had in several years. He denies dyspnea, but has had a cough productive of thick white sputum for the last 5 weeks. He has tried multiple cough suppressants without much success. Hydromet made him throw up. Tessalon was ineffective. He does fairly well with Tussionnex because it puts him to sleep. he had a recent CT of the chest that does not show evidence of heart failure, shows a small bilateral pleural effusions and resolving inflammatory infiltrates as well as a 9 mm nodule of uncertain significance.  Does not have any edema. He denies dizziness, lightheadedness, syncope or palpitations.  Pacemaker interrogation shows 100% ventricular paced rhythm without any episodes of ventricular tachycardia. The [employer]. Jude Accent single chamber generator was implanted in 2013 and has an estimated longevity of another 4-5 years. Lead parameters are good. Ventricular auto capture is in high output mode, he does not have an underlying escape rhythm.    Past Medical History  Diagnosis Date  . Atrial fibrillation  ([place])   . Lymphoma ([place]) 1998    of colon   . Diverticulosis   . Esophageal stricture   . Hyperlipidemia   . Hypertension   . CVA (cerebral infarction)   . Colon polyps   . Urinary frequency   . Umbilical hernia   . Congenital heart block   . CHB (complete heart block) ([place]) 11/06/2013  . Cardiomyopathy, ischemic 11/07/2013  . Chronic combined systolic and diastolic CHF (congestive heart failure) ([place])   . Hyperthyroidism 11/16/2015    Past Surgical History  Procedure Laterality Date  . Appendectomy  1953  . Colectomy      for lymphoma  . Pacemaker placement      replaced 3 x  . Tonsillectomy    . Neck fusion    . Back surgery    . Carotid doppler  11/04/2012    Proximal Rt ICA 50-99% diameter reduction; Lft Bulb demonstrated mild amount homogeneous plaque-not hemodynamically significant; Lft ICA-normal patency.  . Pacemaker generator change  01/31/2012    St Jude Med Accent DR RF model [id number] serial [id number]  . Cardiac catheterization  01/06/2003    Recommend medical therapy  . Cardiovascular stress test  07/16/2012    Mild-moderate perfusion defect seen in Basal inferior, Mid inferior, and Apica lateral consistent with infarct/scar. No scintigraphic evidence for inducible myocardial ischemia. No ECG changes. EKG negative for ischemia.  . Transthoracic echocardiogram  11/04/2012    EF 123456, systolic function moderately reduced, mild regurg of the aortic and mitral valves.  . Permanent pacemaker generator change N/A 01/31/2012    Procedure: PERMANENT PACEMAKER GENERATOR  CHANGE;  Surgeon: Sanda Klein, MD;  Location: [facility] CATH LAB;     Outpatient Prescriptions Prior to Visit  Medication Sig Dispense Refill  . benzonatate (TESSALON) 200 MG capsule Take 1 capsule (200 mg total) by mouth 3 (three) times daily as needed for cough. 90 capsule 1  . cetirizine (ZYRTEC) 5 MG tablet Take 1 tablet (5 mg total) by mouth daily. 30 tablet 11  . chlorpheniramine-HYDROcodone (TUSSIONEX  PENNKINETIC ER) 10-8 MG/5ML SUER Take 5 mLs by mouth every 12 (twelve) hours as needed for cough. 120 mL 0  . HYDROcodone-homatropine (HYDROMET) 5-1.5 MG/5ML syrup Take 5 mLs by mouth every 6 (six) hours as needed. 240 mL 0  . hydrocortisone cream 1 % Apply 1 application topically 2 (two) times daily as needed for itching. Reported on 12/07/2015    . methimazole (TAPAZOLE) 10 MG tablet Take 3 tablets (30 mg total) by mouth daily. 90 tablet 1  . metoprolol succinate (TOPROL-XL) 25 MG 24 hr tablet Take 1 tablet (25 mg total) by mouth daily. 30 tablet 11  . mometasone (NASONEX) 50 MCG/ACT nasal spray Place 2 sprays into the nose daily. 17 g 12  . potassium chloride SA (K-DUR,KLOR-CON) 20 MEQ tablet Take 1 tablet (20 mEq total) by mouth daily. 30 tablet 11  . pravastatin (PRAVACHOL) 80 MG tablet TAKE 1 TABLET BY MOUTH EVERY DAY 90 tablet 3  . spironolactone (ALDACTONE) 25 MG tablet Take 0.5 tablets (12.5 mg total) by mouth daily. 30 tablet 11  . Tamsulosin HCl (FLOMAX) 0.4 MG CAPS Take 0.4 mg by mouth at bedtime.     Marland Kitchen warfarin (COUMADIN) 2.5 MG tablet Take 1 tablet by mouth daily. Use as directed  1  . amiodarone (PACERONE) 200 MG tablet TAKE 1 TABLET (200 MG TOTAL) BY MOUTH DAILY. (Patient taking differently: TAKE 1 TABLET (200 MG TOTAL) BY MOUTH DAILY AT 2PM) 90 tablet 1  . furosemide (LASIX) 40 MG tablet Take 1 tablet (40 mg total) by mouth daily. 30 tablet 11   No facility-administered medications prior to visit.     Allergies:   Adhesive; Statins; and Penicillins   Social History   Social History  . Marital Status: Married    Spouse Name: [employer]  . Number of Children: 3  . Years of Education: BS   Occupational History  . Retired    Social History Main Topics  . Smoking status: Never Smoker   . Smokeless tobacco: None  . Alcohol Use: No  . Drug Use: No  . Sexual Activity: Not Asked   Other Topics Concern  . None   Social History Narrative   Consumes caffeine 2 cups  per day       Family History:  The patient's family history includes Diabetes in his maternal grandmother; Heart disease in his mother; Prostate cancer in his father; Stroke in his mother. There is no history of Colon cancer or Heart attack.   ROS:   Please see the history of present illness.    ROS All other systems reviewed and are negative.   PHYSICAL EXAM:   VS:  BP 110/56 mmHg  Pulse 64  Ht 5\' 9"  (1.753 m)  Wt 86.637 kg (191 lb)  BMI 28.19 kg/m2   GEN: Well nourished, well developed, in no acute distress HEENT: normal Neck: no JVD, carotid bruits, or masses Cardiac: Laterally displaced apical impulse, paradoxically split S2, RRR; no murmurs, rubs, or gallops,no edema ; healthy subclavian pacemaker site Respiratory:  clear to auscultation  bilaterally, normal work of breathing GI: soft, nontender, nondistended, + BS MS: no deformity or atrophy Skin: warm and dry, no rash Neuro:  Alert and Oriented x 3, Strength and sensation are intact Psych: euthymic mood, full affect  Wt Readings from Last 3 Encounters:  12/12/15 86.637 kg (191 lb)  12/07/15 87.998 kg (194 lb)  12/04/15 86.722 kg (191 lb 3 oz)      Studies/Labs Reviewed:   EKG:  EKG is ordered today.  The ekg ordered today demonstrates 100% ventricular paced rhythm. Atrial mechanism could be very fine atrial fibrillation or true atrial standstill.  Recent Labs: 12/27/2014: Magnesium 2.0 11/13/2015: ALT 61; B Natriuretic Peptide 552.5* 11/18/2015: TSH 0.047* 12/04/2015: BUN 24; Creat 0.85; Hemoglobin 13.5; Platelets 269; Potassium 4.5; Sodium 135   Lipid Panel    Component Value Date/Time   CHOL 205* 12/08/2014 0235   TRIG 143 12/08/2014 0235   HDL 32* 12/08/2014 0235   CHOLHDL 6.4 12/08/2014 0235   VLDL 29 12/08/2014 0235   LDLCALC 144* 12/08/2014 0235    ASSESSMENT:    1. Acute on chronic systolic heart failure (HCC)   2. Cardiomyopathy, ischemic   3. CHB (complete heart block) (HCC)   4. Chronic atrial  fibrillation (HCC)   5. Warfarin anticoagulation   6. History of sustained ventricular tachycardia   7. Single chamber St. Jude pacemaker 2013   8. On amiodarone therapy   9. Hyperthyroidism   10. Hyperlipidemia   11. Coronary artery disease involving native coronary artery of native heart without angina pectoris   12. Carotid stenosis, right      PLAN:  In order of problems listed above:  1. CHF: There are no signs of hypervolemia at this time after aggressive diuresis. Most of the weight loss was resolution of hypervolemia, but I believe he has also lost true weight due to hyperthyroidism.. Functional status is poor, but due to weakness rather than dyspnea. Reduce furosemide to 4 days a week only.  2. CMP: Recent estimate of LV EF 45-50 percent with anteroseptal akinesis. There was evidence of high left ventricular filling pressures by Doppler and the pulmonary artery pressure was 54 mmHg (February 1) he is on beta blockers and spironolactone and has been intolerance to ace inhibitors due to hypotension.  3. CHB: He is pacemaker dependent. The option for upgrade to a biventricular device has been discussed, but due to his multiple previous surgical procedures and high risk of infection, Dr. Lovena Le recommended observation at least for the time being. 4. AFib: Whether he has atrial standstill or true atrial fibrillation, anticoagulation is recommended. CHADSVasc 24  or higher (CVA 2, age, HF). 5. Warfarin anticoagulation checked today. INR very elevated. Not sure if this is secondary to thyroid disorder. Dose adjusted 6. VT:  None seen on current device check. None has been recorded since he was started on treatment with amiodarone. in 2014 for an episode of near syncope and sustained monomorphic VT with a fairly slow cycle length of 370 ms. 7. PPM: Normal single chamber pacemaker function, pacemaker dependent.  auto capture in high output mode. Will reevaluate at his next office appointment.  Initial device implanted in 1984, most recent generator change out and placement of new bipolar right ventricular lead in 2013  8. Amiodarone side effects: will try to reduce amiodarone dose 100 mg daily to see if this hastens resolution of hyperthyroidism, without recurrence of ventricular tachycardia. Last Dr. Lovena Le if he believes alternative antiarrhythmic therapy is warranted  9.  Hyperthyroidism (probably amiodarone related): he had labs performed about a week ago and was told by Dr. Buddy Duty to stay on the same medications. Unfortunately, I don't have a copy of those results  10.  LDL cholesterol was too high, but will focus on addressing this once we have resolved the thyroid problem. It's not clear to me whether he was compliant with pravastatin around the time that the labs were drawn 11. CAD: No angina pectoris despite hyperthyroidism 12. Carotid stenosis, right: Known stable 60-79 percent stenosis; CT head shows evidence of old strokes but in the cerebellar distribution. It's time to reevaluate this     Medication Adjustments/Labs and Tests Ordered: Current medicines are reviewed at length with the patient today.  Concerns regarding medicines are outlined above.  Medication changes, Labs and Tests ordered today are listed in the Patient Instructions below. Patient Instructions  Your physician has recommended you make the following change in your medication:   DECREASE AMIODARONE TO 100 MG DAILY ( 1/2 TABLET DAILY).  DECREASE FUROSEMIDE TO 20 MG 4 TIMES A WEEK.  A NEW RX HAS BEEN SENT TO YOUR PHARMACY FOR THE 20 MG TABLETS.  Dr. Sallyanne Kuster recommends that you schedule a follow-up appointment in: 4-6 WEEKS          Signed, Chaquita Basques, MD  12/12/2015 2:23 PM    [facility] [address], [place], [place]  [address] Phone: [phone]; Fax: [phone]

## 2015-12-12 NOTE — Patient Instructions (Signed)
Your physician has recommended you make the following change in your medication:   DECREASE AMIODARONE TO 100 MG DAILY ( 1/2 TABLET DAILY).  DECREASE FUROSEMIDE TO 20 MG 4 TIMES A WEEK.  A NEW RX HAS BEEN SENT TO YOUR PHARMACY FOR THE 20 MG TABLETS.  Dr. Sallyanne Kuster recommends that you schedule a follow-up appointment in: 4-6 WEEKS

## 2015-12-12 NOTE — Telephone Encounter (Signed)
-----   Message from Sanda Klein, MD sent at 12/12/2015  2:23 PM EST ----- Not urgent but he needs a repeat carotid ultrasound for right carotid stenosis. Also, please ask him if he was taking the pravastatin when he had his most recent blood tests checked?

## 2015-12-12 NOTE — Telephone Encounter (Signed)
Order placed for carotid US. Patient notified.  States he was taking pravastatin when his last labs were done.

## 2015-12-15 DIAGNOSIS — E0580 Other thyrotoxicosis without thyrotoxic crisis or storm: Secondary | ICD-10-CM | POA: Diagnosis not present

## 2015-12-18 ENCOUNTER — Ambulatory Visit (INDEPENDENT_AMBULATORY_CARE_PROVIDER_SITE_OTHER): Payer: PPO | Admitting: Pharmacist Clinician (PhC)/ Clinical Pharmacy Specialist

## 2015-12-18 DIAGNOSIS — Z7901 Long term (current) use of anticoagulants: Secondary | ICD-10-CM

## 2015-12-18 DIAGNOSIS — I4891 Unspecified atrial fibrillation: Secondary | ICD-10-CM

## 2015-12-18 DIAGNOSIS — I482 Chronic atrial fibrillation, unspecified: Secondary | ICD-10-CM

## 2015-12-18 DIAGNOSIS — I4821 Permanent atrial fibrillation: Secondary | ICD-10-CM

## 2015-12-18 LAB — POCT INR: INR: 1.7

## 2015-12-26 ENCOUNTER — Ambulatory Visit (INDEPENDENT_AMBULATORY_CARE_PROVIDER_SITE_OTHER): Payer: PPO | Admitting: Physician Assistant

## 2015-12-26 ENCOUNTER — Encounter: Payer: Self-pay | Admitting: Physician Assistant

## 2015-12-26 ENCOUNTER — Ambulatory Visit (INDEPENDENT_AMBULATORY_CARE_PROVIDER_SITE_OTHER): Payer: PPO | Admitting: Pharmacist Clinician (PhC)/ Clinical Pharmacy Specialist

## 2015-12-26 VITALS — BP 166/70 | HR 70 | Ht 69.0 in | Wt 194.4 lb

## 2015-12-26 DIAGNOSIS — I442 Atrioventricular block, complete: Secondary | ICD-10-CM

## 2015-12-26 DIAGNOSIS — I482 Chronic atrial fibrillation, unspecified: Secondary | ICD-10-CM

## 2015-12-26 DIAGNOSIS — I4821 Permanent atrial fibrillation: Secondary | ICD-10-CM

## 2015-12-26 DIAGNOSIS — Z5181 Encounter for therapeutic drug level monitoring: Secondary | ICD-10-CM | POA: Diagnosis not present

## 2015-12-26 DIAGNOSIS — R946 Abnormal results of thyroid function studies: Secondary | ICD-10-CM | POA: Diagnosis not present

## 2015-12-26 DIAGNOSIS — I4891 Unspecified atrial fibrillation: Secondary | ICD-10-CM

## 2015-12-26 DIAGNOSIS — E049 Nontoxic goiter, unspecified: Secondary | ICD-10-CM | POA: Diagnosis not present

## 2015-12-26 DIAGNOSIS — I10 Essential (primary) hypertension: Secondary | ICD-10-CM | POA: Diagnosis not present

## 2015-12-26 DIAGNOSIS — Z7901 Long term (current) use of anticoagulants: Secondary | ICD-10-CM

## 2015-12-26 DIAGNOSIS — E785 Hyperlipidemia, unspecified: Secondary | ICD-10-CM | POA: Diagnosis not present

## 2015-12-26 DIAGNOSIS — I2510 Atherosclerotic heart disease of native coronary artery without angina pectoris: Secondary | ICD-10-CM | POA: Diagnosis not present

## 2015-12-26 DIAGNOSIS — R8299 Other abnormal findings in urine: Secondary | ICD-10-CM

## 2015-12-26 DIAGNOSIS — R82998 Other abnormal findings in urine: Secondary | ICD-10-CM

## 2015-12-26 DIAGNOSIS — I2583 Coronary atherosclerosis due to lipid rich plaque: Secondary | ICD-10-CM

## 2015-12-26 DIAGNOSIS — I5022 Chronic systolic (congestive) heart failure: Secondary | ICD-10-CM

## 2015-12-26 DIAGNOSIS — E0580 Other thyrotoxicosis without thyrotoxic crisis or storm: Secondary | ICD-10-CM | POA: Diagnosis not present

## 2015-12-26 LAB — POCT INR: INR: 2

## 2015-12-26 NOTE — Progress Notes (Signed)
Patient ID: Benjamin Mckay, male   DOB: [birth year], 70 y.o.   MRN: [id number]    Date:  12/26/2015   ID:  Benjamin Mckay, DOB [birth year], MRN [id number]  PCP:  No PCP Per Patient  Primary Cardiologist:  [facility]   Complaint: Heart failure evaluation   History of Present Illness: Benjamin Mckay is a 70 y.o. male with ischemic cardiomyopathy LVEF 30-35%, CAD, permanent atrial fibrillation, CHB s/p ICD, CVA, and NSVT here with acute on chronic systolic heart failure who was admitted from the office on 11/13/15 for SOB, LE edema and 30 lbs weight gain.   He admitted to dietary indiscretion while on a cruise. Since that time he had been noticing increased weight (208--> 228lbs), LE edema that extended into his scrotum and worsening SOB despite trying to double his home lasix. It was decided to admit him from the office for IV diuresis.   He was diuresed from 230-198 pounds. Creatinine improved from 1.67 1.16. Was continued on metoprolol XL 25 mg daily and spironolactone 12.5 mg daily.Losartan was held at discharge. Lasix was continued at 40 mg daily with 20 MEQ Of potassium.He is hyperthyroid which is likely related to amiodarone which was recently decreased 100 mg daily and was started on methimazole 10 mg.  I saw him on February 20he reported feeling terrible in the way of being fatigued and tired.  His hemoglobin and basic metabolic panel.  He still struggles with persistent cough that was all was recently increased to 50 mg. Is here today for follow-up reports his weight has been stable. He is feeling better but still has the persistent cough.  Tussionex seems to help because it puts him to sleep. He had a CT of the chest recently showed no evidence of heart failure with small bilateral pleural effusions and resolving inflammatory infiltrates as well as a 9 mm nodule.  Recent interrogation of his pacemaker showed he was 100% ventricular paced without any episodes of ventricular tachycardia.   His wife does note his urine has been very dark and orange colored.  His last office visit his INR was up to 6 but today he is therapeutic. He is wearing his compression socks.  The patient currently denies nausea, vomiting, fever, chest pain, shortness of breath, orthopnea, dizziness, PND, cough, congestion, abdominal pain, hematochezia, melena, lower extremity edema, claudication.  Wt Readings from Last 3 Encounters:  12/26/15 194 lb 6.4 oz (88.179 kg)  12/12/15 191 lb (86.637 kg)  12/07/15 194 lb (87.998 kg)     Past Medical History  Diagnosis Date  . Atrial fibrillation ([place])   . Lymphoma ([place]) 1998    of colon   . Diverticulosis   . Esophageal stricture   . Hyperlipidemia   . Hypertension   . CVA (cerebral infarction)   . Colon polyps   . Urinary frequency   . Umbilical hernia   . Congenital heart block   . CHB (complete heart block) ([place]) 11/06/2013  . Cardiomyopathy, ischemic 11/07/2013  . Chronic combined systolic and diastolic CHF (congestive heart failure) ([place])   . Hyperthyroidism 11/16/2015    Current Outpatient Prescriptions  Medication Sig Dispense Refill  . amiodarone (PACERONE) 200 MG tablet Take 0.5 tablets (100 mg total) by mouth daily. 90 tablet 1  . benzonatate (TESSALON) 200 MG capsule Take 1 capsule (200 mg total) by mouth 3 (three) times daily as needed for cough. 90 capsule 1  . cetirizine (ZYRTEC) 5 MG tablet Take 1 tablet (5 mg  total) by mouth daily. 30 tablet 11  . chlorpheniramine-HYDROcodone (TUSSIONEX PENNKINETIC ER) 10-8 MG/5ML SUER Take 5 mLs by mouth every 12 (twelve) hours as needed for cough. 120 mL 0  . furosemide (LASIX) 20 MG tablet TAKE 20 MG 4 TIMES WEEKLY 30 tablet 5  . HYDROcodone-homatropine (HYDROMET) 5-1.5 MG/5ML syrup Take 5 mLs by mouth every 6 (six) hours as needed. 240 mL 0  . hydrocortisone cream 1 % Apply 1 application topically 2 (two) times daily as needed for itching. Reported on 12/07/2015    . methimazole (TAPAZOLE) 10 MG  tablet Take 3 tablets (30 mg total) by mouth daily. 90 tablet 1  . metoprolol succinate (TOPROL-XL) 25 MG 24 hr tablet Take 1 tablet (25 mg total) by mouth daily. 30 tablet 11  . mometasone (NASONEX) 50 MCG/ACT nasal spray Place 2 sprays into the nose daily. 17 g 12  . potassium chloride Mckay (K-DUR,KLOR-CON) 20 MEQ tablet Take 1 tablet (20 mEq total) by mouth daily. 30 tablet 11  . pravastatin (PRAVACHOL) 80 MG tablet TAKE 1 TABLET BY MOUTH EVERY DAY 90 tablet 3  . Tamsulosin HCl (FLOMAX) 0.4 MG CAPS Take 0.4 mg by mouth at bedtime.     Marland Kitchen warfarin (COUMADIN) 2.5 MG tablet Take 1 tablet by mouth daily. Use as directed  1   No current facility-administered medications for this visit.    Allergies:    Allergies  Allergen Reactions  . Adhesive [Tape] Other (See Comments)    Skin irritation - please use paper tape  . Statins Other (See Comments)    Leg cramps.   Tolerates pravastatin.    Marland Kitchen Penicillins Rash    Has patient had a PCN reaction causing immediate rash, facial/tongue/throat swelling, SOB or lightheadedness with hypotension: Yes Has patient had a PCN reaction causing severe rash involving mucus membranes or skin necrosis: No Has patient had a PCN reaction that required hospitalization No Has patient had a PCN reaction occurring within the last 10 years: No If all of the above answers are "NO", then may proceed with Cephalosporin use.    Social History:  The patient  reports that he has never smoked. He does not have any smokeless tobacco history on file. He reports that he does not drink alcohol or use illicit drugs.   Family history:   Family History  Problem Relation Age of Onset  . Prostate cancer Father   . Colon cancer Neg Hx   . Diabetes Maternal Grandmother   . Heart disease Mother   . Stroke Mother   . Heart attack Neg Hx     ROS:  Please see the history of present illness.  All other systems reviewed and negative.   PHYSICAL EXAM: VS:  BP 166/70 mmHg  Pulse 70   Ht 5\' 9"  (1.753 m)  Wt 194 lb 6.4 oz (88.179 kg)  BMI 28.69 kg/m2 Well nourished, well developed, in no acute distress HEENT: Pupils are equal round react to light accommodation extraocular movements are intact.  Neck: no JVDNo cervical lymphadenopathy. Cardiac: Regular rate and rhythm without murmurs rubs or gallops. Lungs:  clear to auscultation bilaterally, no wheezing, rhonchi or rales Abd: soft, nontender, positive bowel sounds all quadrants, no hepatosplenomegaly Ext: no lower extremity edema.  2+ radial and dorsalis pedis pulses. Skin: warm and dry Neuro:  Grossly normal     ASSESSMENT AND PLAN:  Problem List Items Addressed This Visit    HTN (hypertension)   HLD   Dark urine -  Primary   Relevant Orders   Urinalysis   Chronic systolic CHF (congestive heart failure) (HCC)   Chronic atrial fibrillation (HCC)   CHB (complete heart block) (HCC)   CAD (coronary artery disease)     Mr. Kowal  feels better than he did before. His weight is stable.  Dr. Sallyanne Kuster decreased his Lasix to 4 times daily and he appears euvolemic on exam. He is wearing his compression socks. His heart rate is controlled.  INR is therapeutic. He is having very dark orange colored urine. Will check a urinalysis. He does drink about 3 bottles of water a day.  Serum creatinine was within normal limits on recent labs..  Do not think he is dehydrated in any way.   He denies any chest pain.  He is scheduled for follow-up in 2 weeks.

## 2015-12-26 NOTE — Patient Instructions (Signed)
Your physician recommends that you return for lab work TODAY - urinalysis.  Please keep your appointment with Dr Sallyanne Kuster on 01/12/2016 at 9:00a.  If you need a refill on your cardiac medications before your next appointment, please call your pharmacy.

## 2015-12-27 ENCOUNTER — Telehealth: Payer: Self-pay | Admitting: Cardiovascular Disease

## 2015-12-27 LAB — URINALYSIS
BILIRUBIN URINE: NEGATIVE
GLUCOSE, UA: NEGATIVE
Hgb urine dipstick: NEGATIVE
Ketones, ur: NEGATIVE
LEUKOCYTES UA: NEGATIVE
Nitrite: NEGATIVE
Protein, ur: NEGATIVE
SPECIFIC GRAVITY, URINE: 1.019 (ref 1.001–1.035)
pH: 5.5 (ref 5.0–8.0)

## 2015-12-27 NOTE — Telephone Encounter (Signed)
Results given, pt aware.

## 2015-12-27 NOTE — Telephone Encounter (Signed)
Pt is calling back to get the results to his blood test. Please f/u with him  Thanks

## 2016-01-08 LAB — CUP PACEART INCLINIC DEVICE CHECK
Implantable Lead Location: 753860
Lead Channel Setting Pacing Amplitude: 1 V
Lead Channel Setting Pacing Pulse Width: 0.5 ms
Lead Channel Setting Sensing Sensitivity: 2.5 mV
MDC IDC LEAD IMPLANT DT: 20130419
MDC IDC PG SERIAL: 7313697
MDC IDC SESS DTM: 20170327115515

## 2016-01-09 ENCOUNTER — Ambulatory Visit (INDEPENDENT_AMBULATORY_CARE_PROVIDER_SITE_OTHER): Payer: PPO | Admitting: Pulmonary Disease

## 2016-01-09 ENCOUNTER — Encounter: Payer: Self-pay | Admitting: Pulmonary Disease

## 2016-01-09 ENCOUNTER — Telehealth: Payer: Self-pay | Admitting: Gastroenterology

## 2016-01-09 VITALS — BP 144/78 | HR 66 | Ht 69.0 in | Wt 187.2 lb

## 2016-01-09 DIAGNOSIS — K219 Gastro-esophageal reflux disease without esophagitis: Secondary | ICD-10-CM

## 2016-01-09 DIAGNOSIS — R911 Solitary pulmonary nodule: Secondary | ICD-10-CM

## 2016-01-09 DIAGNOSIS — R05 Cough: Secondary | ICD-10-CM | POA: Diagnosis not present

## 2016-01-09 DIAGNOSIS — R1310 Dysphagia, unspecified: Secondary | ICD-10-CM | POA: Diagnosis not present

## 2016-01-09 DIAGNOSIS — R059 Cough, unspecified: Secondary | ICD-10-CM

## 2016-01-09 NOTE — Assessment & Plan Note (Signed)
Repeat CT chest May 2017 to follow-up the 9 mm groundglass nodule seen in the right upper lobe.

## 2016-01-09 NOTE — Patient Instructions (Signed)
We will order a barium swallow test and refer you back to [facility] to evaluate the esophageal stricture We will repeat a CT chest in May 2017 to follow-up the pulmonary nodule Follow-up with me in 4-6 weeks

## 2016-01-09 NOTE — Progress Notes (Signed)
Subjective:    Patient ID: Benjamin Mckay, male    DOB: [birth year], 70 y.o.   MRN: [id number]  Synopsis: This gentleman has systolic heart failure and a history of complete heart block with atrial fibrillation he takes amiodarone and warfarin and was sent to the [facility] pulmonary clinic for evaluation of cough in February 2017. Ulnar function testing was ordered but had not been completed yet. He also had a high-resolution CT chest ordered.  HPI Chief Complaint  Patient presents with  . Follow-up    pt c/o nonprod cough, intermittent chest pain with cough.  denies mucus production, wheezing, fever.     He says that he is still coughing quite a bit. Some days are worse than others.  He says that wheezes from time to time.  He says that sometimes there is a pressure in his chest.  He will sometimes cough up white mucus, but right now he is not getting that.  He has hemoptysis in th hospital, but none since.   No acid fluex or indigestion.  He takes protonix reguarly.  Protonix didn't help the cough. No post nasal drip.  Never smoker.  Food "sits" in his throat often, he has had his esophagus dilated 2x in the past.  The last time he had this was 5 years ago.  Water will make him cough a lot. He had a barium swallow years ago at [place], maybe 5 years ago before his last esophageal dilation  Past Medical History  Diagnosis Date  . Atrial fibrillation ([place])   . Lymphoma ([place]) 1998    of colon   . Diverticulosis   . Esophageal stricture   . Hyperlipidemia   . Hypertension   . CVA (cerebral infarction)   . Colon polyps   . Urinary frequency   . Umbilical hernia   . Congenital heart block   . CHB (complete heart block) ([place]) 11/06/2013  . Cardiomyopathy, ischemic 11/07/2013  . Chronic combined systolic and diastolic CHF (congestive heart failure) ([place])   . Hyperthyroidism 11/16/2015      Review of Systems  Constitutional: Negative for fever, chills and fatigue.  HENT:  Negative for postnasal drip, rhinorrhea and sinus pressure.   Respiratory: Positive for cough. Negative for shortness of breath and wheezing.   Cardiovascular: Positive for leg swelling. Negative for chest pain and palpitations.       Objective:   Physical Exam Filed Vitals:   01/09/16 1008  BP: 144/78  Pulse: 66  Height: 5\' 9"  (1.753 m)  Weight: 187 lb 3.2 oz (84.913 kg)  SpO2: 97%    Gen: chronically ill  appearing, no acute distress HENT: NCAT, OP clear, neck supple without masses Eyes: PERRL, EOMi Lymph: no cervical lymphadenopathy PULM: Faint crackle RLL, otherwise clear CV: RRR, systolic murmur, no JVD GI: BS+, soft, nontender, no hsm Derm: no rash or skin breakdown MSK: normal bulk and tone Neuro: A&Ox4, CN II-XII intact, strength 5/5 in all 4 extremities Psyche: normal mood and affect  Notes from our nurse practitioner reviewed where he was evaluated for cough and sent for a CT chest  Images from his CT chest personally reviewed, see summary below     Assessment & Plan:  Cough I have personally reviewed the images from his high-resolution CT chest which showed the nodule as well as what looked like a resolving infiltrate in the right lower lobe. He tells me that he has issues with food getting stuck in his throat and  has had esophageal dysphagia and strictures requiring balloon dilation twice in the past. He's been expanding more trouble with this recently. So while I see no evidence at this time of amiodarone-induced lung toxicity and do worry that he has recurrent aspiration pneumonitis. This is likely due to esophageal stricture.  So his cough may be due to aspiration pneumonitis, but there is also likely a strong component of laryngeal irritation from esophageal reflux contribute.  Plan: Barium swallow Referral back to [facility] If no improvement in cough after esophageal dilation (if needed) then we will treat for ongoing laryngeal irritation with either  Neurontin or Elavil F/u 3 months  Esophageal reflux Continue Protonix for now  Solitary pulmonary nodule Repeat CT chest May 2017 to follow-up the 9 mm groundglass nodule seen in the right upper lobe.     Current outpatient prescriptions:  .  amiodarone (PACERONE) 200 MG tablet, Take 0.5 tablets (100 mg total) by mouth daily., Disp: 90 tablet, Rfl: 1 .  furosemide (LASIX) 20 MG tablet, TAKE 20 MG 4 TIMES WEEKLY, Disp: 30 tablet, Rfl: 5 .  hydrocortisone cream 1 %, Apply 1 application topically 2 (two) times daily as needed for itching. Reported on 12/07/2015, Disp: , Rfl:  .  methimazole (TAPAZOLE) 10 MG tablet, Take 3 tablets (30 mg total) by mouth daily., Disp: 90 tablet, Rfl: 1 .  metoprolol succinate (TOPROL-XL) 25 MG 24 hr tablet, Take 1 tablet (25 mg total) by mouth daily., Disp: 30 tablet, Rfl: 11 .  pravastatin (PRAVACHOL) 80 MG tablet, TAKE 1 TABLET BY MOUTH EVERY DAY, Disp: 90 tablet, Rfl: 3 .  Tamsulosin HCl (FLOMAX) 0.4 MG CAPS, Take 0.4 mg by mouth at bedtime. , Disp: , Rfl:  .  warfarin (COUMADIN) 2.5 MG tablet, Take 1 tablet by mouth daily. Use as directed, Disp: , Rfl: 1

## 2016-01-09 NOTE — Telephone Encounter (Signed)
Spoke with patient and he wants to wait on OV with Dr. Loletha Carrow.

## 2016-01-09 NOTE — Assessment & Plan Note (Addendum)
Continue Protonix for now 

## 2016-01-09 NOTE — Assessment & Plan Note (Signed)
I have personally reviewed the images from his high-resolution CT chest which showed the nodule as well as what looked like a resolving infiltrate in the right lower lobe. He tells me that he has issues with food getting stuck in his throat and has had esophageal dysphagia and strictures requiring balloon dilation twice in the past. He's been expanding more trouble with this recently. So while I see no evidence at this time of amiodarone-induced lung toxicity and do worry that he has recurrent aspiration pneumonitis. This is likely due to esophageal stricture.  So his cough may be due to aspiration pneumonitis, but there is also likely a strong component of laryngeal irritation from esophageal reflux contribute.  Plan: Barium swallow Referral back to [facility] If no improvement in cough after esophageal dilation (if needed) then we will treat for ongoing laryngeal irritation with either Neurontin or Elavil F/u 3 months

## 2016-01-11 ENCOUNTER — Other Ambulatory Visit: Payer: Self-pay

## 2016-01-11 ENCOUNTER — Ambulatory Visit (HOSPITAL_COMMUNITY)
Admission: RE | Admit: 2016-01-11 | Discharge: 2016-01-11 | Disposition: A | Discharge status: Home / Self Care | Payer: PPO | Source: Ambulatory Visit | Attending: Pulmonary Disease | Admitting: Pulmonary Disease

## 2016-01-11 DIAGNOSIS — R1310 Dysphagia, unspecified: Secondary | ICD-10-CM | POA: Diagnosis not present

## 2016-01-11 DIAGNOSIS — K219 Gastro-esophageal reflux disease without esophagitis: Secondary | ICD-10-CM

## 2016-01-11 DIAGNOSIS — K222 Esophageal obstruction: Secondary | ICD-10-CM

## 2016-01-11 DIAGNOSIS — T17300A Unspecified foreign body in larynx causing asphyxiation, initial encounter: Secondary | ICD-10-CM | POA: Diagnosis not present

## 2016-01-11 MED ORDER — MAGNESIUM HYDROXIDE 400 MG/5ML PO SUSP
ORAL | Status: AC
  Filled 2016-01-11: qty 30

## 2016-01-12 ENCOUNTER — Ambulatory Visit (INDEPENDENT_AMBULATORY_CARE_PROVIDER_SITE_OTHER): Payer: PPO | Admitting: Pharmacist Clinician (PhC)/ Clinical Pharmacy Specialist

## 2016-01-12 ENCOUNTER — Ambulatory Visit (HOSPITAL_COMMUNITY)
Admission: RE | Admit: 2016-01-12 | Discharge: 2016-01-12 | Disposition: A | Discharge status: Home / Self Care | Payer: PPO | Source: Ambulatory Visit | Attending: Cardiology | Admitting: Cardiology

## 2016-01-12 ENCOUNTER — Encounter: Payer: Self-pay | Admitting: Cardiovascular Disease

## 2016-01-12 ENCOUNTER — Ambulatory Visit (INDEPENDENT_AMBULATORY_CARE_PROVIDER_SITE_OTHER): Payer: PPO | Admitting: Cardiovascular Disease

## 2016-01-12 ENCOUNTER — Other Ambulatory Visit (HOSPITAL_COMMUNITY): Payer: Self-pay | Admitting: Pulmonary Disease

## 2016-01-12 VITALS — BP 130/82 | HR 80 | Ht 69.0 in | Wt 185.0 lb

## 2016-01-12 DIAGNOSIS — E785 Hyperlipidemia, unspecified: Secondary | ICD-10-CM | POA: Diagnosis not present

## 2016-01-12 DIAGNOSIS — I442 Atrioventricular block, complete: Secondary | ICD-10-CM

## 2016-01-12 DIAGNOSIS — E0580 Other thyrotoxicosis without thyrotoxic crisis or storm: Secondary | ICD-10-CM | POA: Insufficient documentation

## 2016-01-12 DIAGNOSIS — I5042 Chronic combined systolic (congestive) and diastolic (congestive) heart failure: Secondary | ICD-10-CM | POA: Insufficient documentation

## 2016-01-12 DIAGNOSIS — I6521 Occlusion and stenosis of right carotid artery: Secondary | ICD-10-CM

## 2016-01-12 DIAGNOSIS — I6523 Occlusion and stenosis of bilateral carotid arteries: Secondary | ICD-10-CM | POA: Diagnosis not present

## 2016-01-12 DIAGNOSIS — I2510 Atherosclerotic heart disease of native coronary artery without angina pectoris: Secondary | ICD-10-CM

## 2016-01-12 DIAGNOSIS — Z79899 Other long term (current) drug therapy: Secondary | ICD-10-CM

## 2016-01-12 DIAGNOSIS — I110 Hypertensive heart disease with heart failure: Secondary | ICD-10-CM | POA: Diagnosis not present

## 2016-01-12 DIAGNOSIS — Z7901 Long term (current) use of anticoagulants: Secondary | ICD-10-CM

## 2016-01-12 DIAGNOSIS — R1310 Dysphagia, unspecified: Secondary | ICD-10-CM

## 2016-01-12 DIAGNOSIS — I482 Chronic atrial fibrillation, unspecified: Secondary | ICD-10-CM

## 2016-01-12 DIAGNOSIS — I10 Essential (primary) hypertension: Secondary | ICD-10-CM

## 2016-01-12 DIAGNOSIS — I5022 Chronic systolic (congestive) heart failure: Secondary | ICD-10-CM

## 2016-01-12 DIAGNOSIS — T462X5A Adverse effect of other antidysrhythmic drugs, initial encounter: Secondary | ICD-10-CM

## 2016-01-12 DIAGNOSIS — I4891 Unspecified atrial fibrillation: Secondary | ICD-10-CM | POA: Diagnosis not present

## 2016-01-12 DIAGNOSIS — Z8679 Personal history of other diseases of the circulatory system: Secondary | ICD-10-CM

## 2016-01-12 DIAGNOSIS — Z950 Presence of cardiac pacemaker: Secondary | ICD-10-CM

## 2016-01-12 DIAGNOSIS — I701 Atherosclerosis of renal artery: Secondary | ICD-10-CM

## 2016-01-12 DIAGNOSIS — I4821 Permanent atrial fibrillation: Secondary | ICD-10-CM

## 2016-01-12 LAB — POCT INR: INR: 1.5

## 2016-01-12 MED ORDER — AMIODARONE HCL 200 MG PO TABS: 100.0000 mg | ORAL_TABLET | ORAL | Status: DC

## 2016-01-12 NOTE — Progress Notes (Signed)
Patient ID: Benjamin Mckay, male   DOB: 28-Aug-1946, 70 y.o.   MRN: [id number]    Cardiology Office Note    Date:  01/12/2016   ID:  Benjamin Mckay, DOB Jun 30, 1946, MRN [id number]  PCP:  No PCP Per Patient  Cardiologist:   Sanda Klein, MD   Chief complaint: weakness, weight loss   History of Present Illness:  Benjamin Mckay is a 70 y.o. 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  I don't have his most recent lab results, but his endocrinologist told him that he is "still high" and increase his dose of methimazole. Benjamin Mckay continues to lose weight, has insomnia, polyphagia, accelerated bowel pattern, irritability and heat intolerance, all consistent with hyperthyroidism.  He no longer has any edema and denies dyspnea on exertion. He has not had palpitations or syncope. He denies claudication or any focal neurological events or bleeding problems.  At his last appointment we decreased his amiodarone dose, and an effort to assist with resolution of hyperthyroidism. His INR today was 1.4. Interrogation of his pacemaker shows no episodes of ventricular tachycardia. As always he has 100% ventricular pacing. Device function is normal. The current generator was a change out implant in 2013 and has an estimated longevity of 5.7-9.1 years. Ventricular lead parameters are excellent with pacing threshold 0.75 V at 0.5 ms, sensing R waves greater than 12 mV and impedance 440 ohms.    Past Medical History  Diagnosis Date  . Atrial fibrillation ([place])   . Lymphoma ([place]) 1998    of colon   . Diverticulosis   . Esophageal stricture   .  Hyperlipidemia   . Hypertension   . CVA (cerebral infarction)   . Colon polyps   . Urinary frequency   . Umbilical hernia   . Congenital heart block   . CHB (complete heart block) ([place]) 11/06/2013  . Cardiomyopathy, ischemic 11/07/2013  . Chronic combined systolic and diastolic CHF (congestive heart failure) ([place])   . Hyperthyroidism 11/16/2015    Past Surgical History  Procedure Laterality Date  . Appendectomy  1953  . Colectomy      for lymphoma  . Pacemaker placement      replaced 3 x  . Tonsillectomy    . Neck fusion    . Back surgery    . Carotid doppler  11/04/2012    Proximal Rt ICA 50-99% diameter reduction; Lft Bulb demonstrated mild amount homogeneous plaque-not hemodynamically significant; Lft ICA-normal patency.  . Pacemaker generator change  01/31/2012    St Jude Med [employer] DR RF model [id number] serial [id number]  . Cardiac catheterization  01/06/2003    Recommend medical therapy  . Cardiovascular stress test  07/16/2012    Mild-moderate perfusion defect seen in Basal inferior, Mid inferior, and Apica lateral consistent with infarct/scar. No scintigraphic evidence for inducible myocardial ischemia. No ECG changes. EKG negative for ischemia.  . Transthoracic echocardiogram  11/04/2012    EF [id number], systolic function moderately reduced, mild regurg of the aortic and mitral valves.  . Permanent pacemaker generator change N/A 01/31/2012    Procedure: PERMANENT PACEMAKER GENERATOR CHANGE;  Surgeon: Sanda Klein, MD;  Location: [facility] CATH LAB;     Current Medications: Outpatient Prescriptions Prior to Visit  Medication Sig Dispense Refill  . furosemide (LASIX) 20 MG tablet TAKE 20 MG 4 TIMES WEEKLY 30 tablet 5  . hydrocortisone cream 1 % Apply 1 application topically 2 (two) times daily as needed for itching. Reported on 12/07/2015    . methimazole (TAPAZOLE) 10 MG tablet Take 3 tablets (30 mg total) by mouth daily. 90 tablet 1  . metoprolol succinate (TOPROL-XL) 25 MG 24 hr tablet  Take 1 tablet (25 mg total) by mouth daily. 30 tablet 11  . pravastatin (PRAVACHOL) 80 MG tablet TAKE 1 TABLET BY MOUTH EVERY DAY 90 tablet 3  . Tamsulosin HCl (FLOMAX) 0.4 MG CAPS Take 0.4 mg by mouth at bedtime.     Marland Kitchen warfarin (COUMADIN) 2.5 MG tablet Take 1 tablet by mouth daily. Use as directed  1  . amiodarone (PACERONE) 200 MG tablet Take 0.5 tablets (100 mg total) by mouth daily. 90 tablet 1   No facility-administered medications prior to visit.     Allergies:   Adhesive; Statins; and Penicillins   Social History   Social History  . Marital Status: Married    Spouse Name: [employer]  . Number of Children: 3  . Years of Education: BS   Occupational History  . Retired    Social History Main Topics  . Smoking status: Never Smoker   . Smokeless tobacco: Never Used  . Alcohol Use: No  . Drug Use: No  . Sexual Activity: Not Asked   Other Topics Concern  . None   Social History Narrative   Consumes caffeine 2 cups per day       Family History:  The patient's family history includes Diabetes in his maternal grandmother; Heart disease in his mother; Prostate cancer in his father; Stroke in his mother. There is no history of Colon cancer or Heart attack.   ROS:   Please see the history of present illness.    ROS All other systems reviewed and are negative.   PHYSICAL EXAM:   VS:  BP 130/82 mmHg  Pulse 80  Ht 5\' 9"  (1.753 m)  Wt 83.915 kg (185 lb)  BMI 27.31 kg/m2   GEN: Well nourished, well developed, in no acute distress HEENT: normal Neck: no JVD, carotid bruits, or masses Cardiac: Paradoxically split second heart sound, RRR; no murmurs, rubs, or gallops,no edema , healthy pacemaker site Respiratory:  clear to auscultation bilaterally, normal work of breathing GI: soft, nontender, nondistended, + BS MS: no deformity or atrophy Skin: warm and dry, no rash Neuro:  Alert and Oriented x 3, Strength and sensation are intact Psych: euthymic mood, full  affect  Wt Readings from Last 3 Encounters:  01/12/16 83.915 kg (185 lb)  01/09/16 84.913 kg (187 lb 3.2 oz)  12/26/15 88.179 kg (194 lb 6.4 oz)      Studies/Labs Reviewed:   EKG:  EKG is not ordered today.   Recent Labs: 11/13/2015: ALT 61; B Natriuretic Peptide 552.5* 11/18/2015: TSH 0.047* 12/04/2015: BUN 24; Creat 0.85; Hemoglobin 13.5; Platelets 269; Potassium 4.5; Sodium 135   Lipid Panel    Component Value Date/Time   CHOL 205* 12/08/2014 0235   TRIG 143 12/08/2014 0235   HDL 32* 12/08/2014 0235   CHOLHDL 6.4 12/08/2014 0235   VLDL 29 12/08/2014 0235   LDLCALC 144* 12/08/2014 0235     ASSESSMENT:    1. Chronic systolic CHF (congestive heart failure) ([place])   2. CHB (complete heart block) ([facility])   3. Chronic atrial fibrillation ([facility])  4. Coronary artery disease involving native coronary artery of native heart without angina pectoris   5. Essential hypertension   6. Single chamber St. Jude pacemaker 2013   7. On amiodarone therapy   8. History of sustained ventricular tachycardia   9. 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 Long term current use of anticoagulant   11. Renal artery stenosis ([place])   12. Carotid stenosis, right   13. Hyperlipidemia      PLAN:  In order of problems listed above:  1. CHF: Appears to be at euvolemic state. We'll continue current dose of diuretic. NYHA functional class II, mostly due to muscle weakness rather than dyspnea. 2. CHB: Pacemaker dependent. Upgrade to CRT device has been discussed but felt to be high-risk due to his multiple previous surgical procedures and increased risk of infection 3. AFib: CHADSVasc 4 (age, CHF, CAD, HTN). Warfarin anticoagulation without bleeding complications and without history of stroke/TIA. 4. CAD: Scar of previous inferior wall myocardial infarction, currently free of angina pectoris, despite stress of hyperthyroid state 5. HTN: Well controlled 6. PPM: Normal device function. Had transient  high output mode, now back to normal CAPTURE mode. Will perform a download in one month to look for evidence of ventricular tachycardia as we decrease the dose of amiodarone. 7. Amiodarone side effects: We'll reduce dose further to 100 mg every other day. 8. VT: None has been recorded since he was started on treatment with amiodarone in 2014 for an episode of near syncope and sustained monomorphic VT with a fairly slow cycle length of 370 ms (inferior scar VT?). We'll monitor his pacemaker monthly for evidence of recurrent VT as we let the amiodarone washout. 9. Hyperthyroidism: He has another appointment for labs next week 10. Anticoagulation: INR low likely due to reduction amiodarone dose, possibly also due to hyperthyroidism 11. RAS:  Normal renal function and blood pressure  12. Carotid stenosis: Rechecked today, mild progression, reevaluate in 6/12 months.  13. Recheck lipids once his thyroid condition is stabilized    Medication Adjustments/Labs and Tests Ordered: Current medicines are reviewed at length with the patient today.  Concerns regarding medicines are outlined above.  Medication changes, Labs and Tests ordered today are listed in the Patient Instructions below. Patient Instructions  Dr Sallyanne Kuster has recommended making the following medication changes: 1. DECREASE Amiodarone to 100 mg (0.5 tablet) EVERY OTHER DAY  Remote monitoring is used to monitor your Pacemaker of ICD from home. This monitoring reduces the number of office visits required to check your device to one time per year. It allows [place] to keep an eye on the functioning of your device to ensure it is working properly. You are scheduled for a device check from home on Feb 12, 2016. You may send your transmission at any time that day. If you have a wireless device, the transmission will be sent automatically. After your physician reviews your transmission, you will receive a postcard with your next transmission date.  Dr  Sallyanne Kuster recommends that you schedule a follow-up appointment in 2 months.  If you need a refill on your cardiac medications before your next appointment, please call your pharmacy.    Benjamin Spray, MD  01/12/2016 10:33 AM    [facility] [address], [place], [place]  [address] Phone: [phone]; Fax: [phone]

## 2016-01-12 NOTE — Patient Instructions (Signed)
Dr Sallyanne Kuster has recommended making the following medication changes: 1. DECREASE Amiodarone to 100 mg (0.5 tablet) EVERY OTHER DAY  Remote monitoring is used to monitor your Pacemaker of ICD from home. This monitoring reduces the number of office visits required to check your device to one time per year. It allows [place] to keep an eye on the functioning of your device to ensure it is working properly. You are scheduled for a device check from home on Feb 12, 2016. You may send your transmission at any time that day. If you have a wireless device, the transmission will be sent automatically. After your physician reviews your transmission, you will receive a postcard with your next transmission date.  Dr Sallyanne Kuster recommends that you schedule a follow-up appointment in 2 months.  If you need a refill on your cardiac medications before your next appointment, please call your pharmacy.

## 2016-01-15 ENCOUNTER — Other Ambulatory Visit: Payer: Self-pay

## 2016-01-15 DIAGNOSIS — I6523 Occlusion and stenosis of bilateral carotid arteries: Secondary | ICD-10-CM

## 2016-01-15 DIAGNOSIS — E0580 Other thyrotoxicosis without thyrotoxic crisis or storm: Secondary | ICD-10-CM | POA: Diagnosis not present

## 2016-01-16 LAB — CUP PACEART INCLINIC DEVICE CHECK
Implantable Lead Implant Date: 20130419
MDC IDC LEAD LOCATION: 753860
MDC IDC SESS DTM: 20170404102245
Pulse Gen Model: 1210
Pulse Gen Serial Number: 7313697

## 2016-01-18 ENCOUNTER — Ambulatory Visit (HOSPITAL_COMMUNITY)
Admission: RE | Admit: 2016-01-18 | Discharge: 2016-01-18 | Disposition: A | Discharge status: Home / Self Care | Payer: PPO | Source: Ambulatory Visit | Attending: Pulmonary Disease | Admitting: Pulmonary Disease

## 2016-01-18 DIAGNOSIS — K222 Esophageal obstruction: Secondary | ICD-10-CM | POA: Insufficient documentation

## 2016-01-18 DIAGNOSIS — R05 Cough: Secondary | ICD-10-CM

## 2016-01-18 DIAGNOSIS — G459 Transient cerebral ischemic attack, unspecified: Secondary | ICD-10-CM | POA: Diagnosis not present

## 2016-01-18 DIAGNOSIS — R1310 Dysphagia, unspecified: Secondary | ICD-10-CM

## 2016-01-18 DIAGNOSIS — R059 Cough, unspecified: Secondary | ICD-10-CM

## 2016-01-18 DIAGNOSIS — T17300A Unspecified foreign body in larynx causing asphyxiation, initial encounter: Secondary | ICD-10-CM | POA: Diagnosis not present

## 2016-01-22 ENCOUNTER — Encounter: Payer: Self-pay | Admitting: Cardiovascular Disease

## 2016-01-22 ENCOUNTER — Ambulatory Visit (INDEPENDENT_AMBULATORY_CARE_PROVIDER_SITE_OTHER): Payer: PPO | Admitting: Pharmacist Clinician (PhC)/ Clinical Pharmacy Specialist

## 2016-01-22 DIAGNOSIS — I482 Chronic atrial fibrillation, unspecified: Secondary | ICD-10-CM

## 2016-01-22 DIAGNOSIS — I4821 Permanent atrial fibrillation: Secondary | ICD-10-CM

## 2016-01-22 DIAGNOSIS — Z7901 Long term (current) use of anticoagulants: Secondary | ICD-10-CM

## 2016-01-22 DIAGNOSIS — I4891 Unspecified atrial fibrillation: Secondary | ICD-10-CM | POA: Diagnosis not present

## 2016-01-22 LAB — POCT INR: INR: 2.2

## 2016-01-30 DIAGNOSIS — H2513 Age-related nuclear cataract, bilateral: Secondary | ICD-10-CM | POA: Diagnosis not present

## 2016-02-05 ENCOUNTER — Ambulatory Visit (INDEPENDENT_AMBULATORY_CARE_PROVIDER_SITE_OTHER): Payer: PPO | Admitting: Physician Assistant

## 2016-02-05 ENCOUNTER — Ambulatory Visit (INDEPENDENT_AMBULATORY_CARE_PROVIDER_SITE_OTHER): Payer: PPO | Admitting: Pharmacist

## 2016-02-05 ENCOUNTER — Ambulatory Visit (INDEPENDENT_AMBULATORY_CARE_PROVIDER_SITE_OTHER): Payer: PPO

## 2016-02-05 ENCOUNTER — Telehealth: Payer: Self-pay | Admitting: Cardiovascular Disease

## 2016-02-05 VITALS — BP 128/70 | HR 73 | Temp 97.8°F | Resp 18 | Ht 69.0 in | Wt 192.8 lb

## 2016-02-05 DIAGNOSIS — Z7901 Long term (current) use of anticoagulants: Secondary | ICD-10-CM | POA: Diagnosis not present

## 2016-02-05 DIAGNOSIS — J189 Pneumonia, unspecified organism: Secondary | ICD-10-CM | POA: Diagnosis not present

## 2016-02-05 DIAGNOSIS — R0781 Pleurodynia: Secondary | ICD-10-CM

## 2016-02-05 DIAGNOSIS — I4891 Unspecified atrial fibrillation: Secondary | ICD-10-CM | POA: Diagnosis not present

## 2016-02-05 DIAGNOSIS — I4821 Permanent atrial fibrillation: Secondary | ICD-10-CM

## 2016-02-05 DIAGNOSIS — I482 Chronic atrial fibrillation, unspecified: Secondary | ICD-10-CM

## 2016-02-05 DIAGNOSIS — R05 Cough: Secondary | ICD-10-CM

## 2016-02-05 DIAGNOSIS — R053 Chronic cough: Secondary | ICD-10-CM

## 2016-02-05 LAB — COMPREHENSIVE METABOLIC PANEL
ALBUMIN: 3.3 g/dL — AB (ref 3.6–5.1)
ALT: 8 U/L — ABNORMAL LOW (ref 9–46)
AST: 11 U/L (ref 10–35)
Alkaline Phosphatase: 123 U/L — ABNORMAL HIGH (ref 40–115)
BUN: 14 mg/dL (ref 7–25)
CALCIUM: 8.7 mg/dL (ref 8.6–10.3)
CHLORIDE: 104 mmol/L (ref 98–110)
CO2: 27 mmol/L (ref 20–31)
Creat: 0.74 mg/dL (ref 0.70–1.18)
Glucose, Bld: 86 mg/dL (ref 65–99)
POTASSIUM: 4.5 mmol/L (ref 3.5–5.3)
SODIUM: 135 mmol/L (ref 135–146)
TOTAL PROTEIN: 6.4 g/dL (ref 6.1–8.1)
Total Bilirubin: 1.4 mg/dL — ABNORMAL HIGH (ref 0.2–1.2)

## 2016-02-05 LAB — POCT CBC
GRANULOCYTE PERCENT: 64.9 % (ref 37–80)
HEMATOCRIT: 43 % — AB (ref 43.5–53.7)
Hemoglobin: 15.1 g/dL (ref 14.1–18.1)
LYMPH, POC: 1.7 (ref 0.6–3.4)
MCH, POC: 31.9 pg — AB (ref 27–31.2)
MCHC: 35.1 g/dL (ref 31.8–35.4)
MCV: 90.9 fL (ref 80–97)
MID (CBC): 1.3 — AB (ref 0–0.9)
MPV: 7 fL (ref 0–99.8)
POC GRANULOCYTE: 5.4 (ref 2–6.9)
POC LYMPH %: 20 % (ref 10–50)
POC MID %: 15.1 % — AB (ref 0–12)
Platelet Count, POC: 250 10*3/uL (ref 142–424)
RBC: 4.73 M/uL (ref 4.69–6.13)
RDW, POC: 15.9 %
WBC: 8.3 10*3/uL (ref 4.6–10.2)

## 2016-02-05 LAB — POCT URINALYSIS DIP (MANUAL ENTRY)
BILIRUBIN UA: NEGATIVE
Blood, UA: NEGATIVE
Glucose, UA: NEGATIVE
Leukocytes, UA: NEGATIVE
Nitrite, UA: NEGATIVE
PH UA: 5
SPEC GRAV UA: 1.025
Urobilinogen, UA: 1

## 2016-02-05 LAB — POC MICROSCOPIC URINALYSIS (UMFC)

## 2016-02-05 LAB — POCT INR: INR: 1.6

## 2016-02-05 MED ORDER — CODEINE POLT-CHLORPHEN POLT ER 14.7-2.8 MG/5ML PO SUER: 10.0000 mL | Freq: Two times a day (BID) | ORAL | Status: DC

## 2016-02-05 NOTE — Telephone Encounter (Signed)
Per Dr C, patient needs an EP follow-up, first available. Patient has seen Dr Lovena Le in the past. Routed to EP scheduler.

## 2016-02-05 NOTE — Progress Notes (Signed)
02/05/2016 12:09 PM   DOB: 1946/05/31 / MRN: [id number]  SUBJECTIVE:  Benjamin Mckay is a 70 y.o. male presenting for right sided posterior rid pain that is worsened by movement.  He has a history of chornic cough for the last four months.  Denies SOB and DOE.  Has had several investigations with regard to the cough including a chest CT, which was abnormal, and a barium swallow that did show some aspiration of gastric contents into the airway. He is taking Pantoprazole daily and is not missing doses and denies GERD like symptoms.   His cough goes away when he lies on his back.  He does report the cough is getting better and was preceded by a virus.    Immunization History  Administered Date(s) Administered  . Influenza,inj,Quad PF,36+ Mos 07/15/2015  . Pneumococcal Polysaccharide-23 02/01/2012   He is allergic to adhesive; statins; and penicillins.   He  has a past medical history of Atrial fibrillation ([place]); Lymphoma ([place]) (1998); Diverticulosis; Esophageal stricture; Hyperlipidemia; Hypertension; CVA (cerebral infarction); Colon polyps; Urinary frequency; Umbilical hernia; Congenital heart block; CHB (complete heart block) ([place]) (11/06/2013); Cardiomyopathy, ischemic (11/07/2013); Chronic combined systolic and diastolic CHF (congestive heart failure) ([place]); and Hyperthyroidism (11/16/2015).    He  reports that he has never smoked. He has never used smokeless tobacco. He reports that he does not drink alcohol or use illicit drugs. He  has no sexual activity history on file. The patient  has past surgical history that includes Appendectomy [id number]); Colectomy; pacemaker placement; Tonsillectomy; neck fusion; Back surgery; CAROTID DOPPLER (11/04/2012); Pacemaker generator change (01/31/2012); Cardiac catheterization (01/06/2003); Cardiovascular stress test (07/16/2012); transthoracic echocardiogram (11/04/2012); and Permanent pacemaker generator change (N/A, 01/31/2012).  His family history includes Diabetes  in his maternal grandmother; Heart disease in his mother; Prostate cancer in his father; Stroke in his mother. There is no history of Colon cancer or Heart attack.  Review of Systems  Constitutional: Negative for fever and chills.  Respiratory: Positive for cough. Negative for hemoptysis, sputum production, shortness of breath and wheezing.   Cardiovascular: Negative for chest pain.  Musculoskeletal: Positive for myalgias. Negative for back pain and falls.  Skin: Negative for rash.  Neurological: Negative for dizziness and headaches.    Problem list and medications reviewed and updated by myself where necessary, and exist elsewhere in the encounter.   OBJECTIVE:  BP 128/70 mmHg  Pulse 73  Temp(Src) 97.8 F (36.6 C) (Oral)  Resp 18  Ht 5\' 9"  (1.753 m)  Wt 192 lb 12.8 oz (87.454 kg)  BMI 28.46 kg/m2  SpO2 98%  Physical Exam  Constitutional: He is oriented to person, place, and time. He appears well-developed. He does not appear ill.  HENT:  Right Ear: Tympanic membrane normal.  Left Ear: Tympanic membrane normal.  Nose: Nose normal.  Mouth/Throat: Uvula is midline, oropharynx is clear and moist and mucous membranes are normal.  Eyes: Conjunctivae and EOM are normal. Pupils are equal, round, and reactive to light.  Cardiovascular: Normal rate, regular rhythm and normal heart sounds.   Pulmonary/Chest: Effort normal. No respiratory distress. He has no wheezes. He has no rales.    Abdominal: He exhibits no distension.  Musculoskeletal: Normal range of motion.  Neurological: He is alert and oriented to person, place, and time. No cranial nerve deficit. Coordination normal.  Skin: Skin is warm and dry. He is not diaphoretic.  Psychiatric: He has a normal mood and affect.  Nursing note and vitals reviewed.   Results  for orders placed or performed in visit on 02/05/16 (from the past 72 hour(s))  POCT urinalysis dipstick     Status: Abnormal   Collection Time: 02/05/16 11:26 AM    Result Value Ref Range   Color, UA orange (A) yellow   Clarity, UA clear clear   Glucose, UA negative negative   Bilirubin, UA small (A) negative   Ketones, POC UA negative negative   Spec Grav, UA 1.025    Blood, UA negative negative   pH, UA 5.0    Protein Ur, POC trace (A) negative   Urobilinogen, UA 1.0    Nitrite, UA Negative Negative   Leukocytes, UA Negative Negative  POCT CBC     Status: Abnormal   Collection Time: 02/05/16 11:26 AM  Result Value Ref Range   WBC 8.3 4.6 - 10.2 K/uL   Lymph, poc 1.7 0.6 - 3.4   POC LYMPH PERCENT 20.0 10 - 50 %L   MID (cbc) 1.3 (A) 0 - 0.9   POC MID % 15.1 (A) 0 - 12 %M   POC Granulocyte 5.4 2 - 6.9   Granulocyte percent 64.9 37 - 80 %G   RBC 4.73 4.69 - 6.13 M/uL   Hemoglobin 15.1 14.1 - 18.1 g/dL   HCT, POC 43.0 (A) 43.5 - 53.7 %   MCV 90.9 80 - 97 fL   MCH, POC 31.9 (A) 27 - 31.2 pg   MCHC 35.1 31.8 - 35.4 g/dL   RDW, POC 15.9 %   Platelet Count, POC 250 142 - 424 K/uL   MPV 7.0 0 - 99.8 fL  POCT Microscopic Urinalysis ([facility])     Status: Abnormal   Collection Time: 02/05/16 11:34 AM  Result Value Ref Range   WBC,UR,HPF,POC None None WBC/hpf   RBC,UR,HPF,POC None None RBC/hpf   Bacteria None None, Too numerous to count   Mucus Present (A) Absent   Epithelial Cells, UR Per Microscopy None None, Too numerous to count cells/hpf   Lab Results  Component Value Date   WBC 8.3 02/05/2016   HGB 15.1 02/05/2016   HCT 43.0* 02/05/2016   MCV 90.9 02/05/2016   PLT 269 12/04/2015   Lab Results  Component Value Date   TSH 0.047* 11/18/2015   Lab Results  Component Value Date   CREATININE 0.85 12/04/2015   Lab Results  Component Value Date   ALT 61 11/13/2015   AST 48* 11/13/2015   ALKPHOS 95 11/13/2015   BILITOT 1.8* 11/13/2015     Dg Chest 2 View  02/05/2016  CLINICAL DATA:  Right sided posterior rib pain that is worsened by movement. He has a history of chronic cough for the last four months. EXAM: CHEST  2 VIEW  COMPARISON:  11/23/2015 FINDINGS: Mild cardiac enlargement. 3 lead pacer stable. Left lung is clear. Collie Siad mild elevation of the right diaphragm similar to previous. Airspace disease right lower lobe new from the prior study. Possible tiny right pleural effusion. Bony thorax grossly intact. IMPRESSION: Right lower lobe opacity suggesting consolidation with possibilities including pneumonitis. Electronically Signed   By: Skipper Cliche M.D.   On: 02/05/2016 11:42    ASSESSMENT AND PLAN  Hanson was seen today for cough and flank pain.  Diagnoses and all orders for this visit:  Rib pain on right side: His pain is worse with movement, coughing, and there is TTP.  Rads shows right lower lobe pneumonitis.  He has pulmonary and cardiac follow up in place in the very near  future.   -     DG Chest 2 View; Future -     POCT urinalysis dipstick -     POCT CBC  Chronic cough: This started after a virus.  He is not taking ace/arb, and is not missing doses of his protonix. No TDAP documented.  Amiodarone does have the unfortunate side effect of lung toxicity. His dose has been decreased roughly 1 month ago.  Dr. Spero Curb in cards did consider the amiodarone for a cause of chronic cough however felt it was most likely secondary to an esophageal stricture. I spoke with Dr. Sallyanne Kuster via phone and he has been trying to ween Mr. Folley off of amiodarone for some time now and given the radiograph finding advised that he stop the amiodarone altogether. I have called and made to patient aware and have asked that he direct further questions to Dr. Orene Desanctis. I appreciate the recommendations of Dr. Sallyanne Kuster.      The patient was advised to call or return to clinic if he does not see an improvement in symptoms or to seek the care of the closest emergency department if he worsens with the above plan.   Philis Fendt, MHS, PA-C [facility] 02/05/2016 12:09 PM

## 2016-02-05 NOTE — Telephone Encounter (Signed)
Discussed new complaints of right-sided chest discomfort, long-standing persistent cough and new chest x-ray abnormalities. At this point recommend discontinuation of amiodarone and follow-up.

## 2016-02-05 NOTE — Patient Instructions (Signed)
     IF you received an x-ray today, you will receive an invoice from [facility]. Please contact [facility] at [phone] with questions or concerns regarding your invoice.   IF you received labwork today, you will receive an invoice from [facility]. Please contact [facility] at [phone] with questions or concerns regarding your invoice.   Our billing staff will not be able to assist you with questions regarding bills from these companies.  You will be contacted with the lab results as soon as they are available. The fastest way to get your results is to activate your My Chart account. Instructions are located on the last page of this paperwork. If you have not heard from us regarding the results in 2 weeks, please contact this office.      

## 2016-02-06 LAB — AMIODARONE LEVEL
AMIODARONE LVL: 1 ug/mL — AB (ref 1.5–2.5)
Desethylamiodarone: 1.2 ug/mL — ABNORMAL LOW (ref 1.5–2.5)

## 2016-02-09 ENCOUNTER — Ambulatory Visit (INDEPENDENT_AMBULATORY_CARE_PROVIDER_SITE_OTHER): Payer: PPO | Admitting: Pulmonary Disease

## 2016-02-09 VITALS — BP 126/74 | HR 64 | Ht 69.0 in | Wt 192.0 lb

## 2016-02-09 DIAGNOSIS — R059 Cough, unspecified: Secondary | ICD-10-CM

## 2016-02-09 DIAGNOSIS — R911 Solitary pulmonary nodule: Secondary | ICD-10-CM | POA: Diagnosis not present

## 2016-02-09 DIAGNOSIS — K219 Gastro-esophageal reflux disease without esophagitis: Secondary | ICD-10-CM

## 2016-02-09 DIAGNOSIS — R05 Cough: Secondary | ICD-10-CM

## 2016-02-09 NOTE — Assessment & Plan Note (Signed)
He needs to have a repeat CT scan performed to evaluate the nodule, but I would like to move this up to next week because of the abnormalities from his chest x-ray.

## 2016-02-09 NOTE — Progress Notes (Signed)
Subjective:    Patient ID: Benjamin Mckay, male    DOB: 01/20/1946, 70 y.o.   MRN: [id number]  Synopsis: This gentleman has systolic heart failure and a history of complete heart block with atrial fibrillation he takes amiodarone and warfarin and was sent to the [facility] pulmonary clinic for evaluation of cough in February 2017. Ulnar function testing was ordered but had not been completed yet. He also had a high-resolution CT chest ordered.  HPI Chief Complaint  Patient presents with  . Follow-up    pt c/o constant nonprod cough when upright-cough does not bother pt when laying down.  Pt also notes SOB on exertion.     Benjamin Mckay has been doing OK.  He has no breathing complaints.  His cough will come and go and sometimes it is worse than others.  He says that it will worsen from time to time.  He was told that he had Amiodarone-induced lung toxicity so the amiodarone was held. Since then his cough has improved. He had a chest x-ray at that time that was unusual. He denies any breathing complaints like shortness of breath but he still has a cough when he sitting up. He doesn't have a cough when he is lying flat.  Past Medical History  Diagnosis Date  . Atrial fibrillation ([place])   . Lymphoma ([place]) 1998    of colon   . Diverticulosis   . Esophageal stricture   . Hyperlipidemia   . Hypertension   . CVA (cerebral infarction)   . Colon polyps   . Urinary frequency   . Umbilical hernia   . Congenital heart block   . CHB (complete heart block) ([place]) 11/06/2013  . Cardiomyopathy, ischemic 11/07/2013  . Chronic combined systolic and diastolic CHF (congestive heart failure) ([place])   . Hyperthyroidism 11/16/2015  . Colon polyps       Review of Systems  Constitutional: Negative for fever, chills and fatigue.  HENT: Negative for postnasal drip, rhinorrhea and sinus pressure.   Respiratory: Positive for cough. Negative for shortness of breath and wheezing.   Cardiovascular: Negative for chest  pain, palpitations and leg swelling.       Objective:   Physical Exam Filed Vitals:   02/09/16 1544  BP: 126/74  Pulse: 64  Height: 5\' 9"  (1.753 m)  Weight: 192 lb (87.091 kg)  SpO2: 96%    Gen: well appearing HENT: OP clear, TM's clear, neck supple PULM: diminished RLL, diminishe percussion RLL, normal on left CV: RRR, no mgr, trace edema GI: BS+, soft, nontender Derm: no cyanosis or rash Psyche: normal mood and affect   Cardiology records reviewed were he initially had his amiodarone level decreased and then eventually stopped after his urgent care visit  CXR images personally reviewed from where he saw urgent care and had a chest x-ray which shows likely collapse in the right lower lobe.     Assessment & Plan:  Solitary pulmonary nodule He needs to have a repeat CT scan performed to evaluate the nodule, but I would like to move this up to next week because of the abnormalities from his chest x-ray.  Cough He had a barium swallow test which showed moderate dysmotility and some laryngeal penetration which led to a cough reflex. I do worry that the cough is due primarily to acid reflux given his extensive history from esophageal dysmotility and dysphagia in the past. The right lower lobe findings would be consistent with this as that's typically where aspirated  gastric contents 10 to go.  At this time I'm not really convinced that amiodarone has anything to do with his respiratory complaints.  Plan summary Follow-up with GI next week regarding his acid reflux and dysphagia with esophageal dysmotility I'm going to schedule the CT scan for next week Depending on the findings of the CT scan he may need to have a bronchoscopy to evaluate what I think is right lower lobe collapse versus a thoracentesis if he still has a pleural effusion on that side. If he needs to have a procedure we will need to stop warfarin. F/u 4 weeks  Esophageal reflux As above. His barium swallow  showed some aspiration with esophageal dysmotility.     Current outpatient prescriptions:  .  furosemide (LASIX) 20 MG tablet, TAKE 20 MG 4 TIMES WEEKLY, Disp: 30 tablet, Rfl: 5 .  hydrocortisone cream 1 %, Apply 1 application topically 2 (two) times daily as needed for itching. Reported on 12/07/2015, Disp: , Rfl:  .  methimazole (TAPAZOLE) 10 MG tablet, Take 3 tablets (30 mg total) by mouth daily., Disp: 90 tablet, Rfl: 1 .  metoprolol succinate (TOPROL-XL) 25 MG 24 hr tablet, Take 1 tablet (25 mg total) by mouth daily., Disp: 30 tablet, Rfl: 11 .  pantoprazole (PROTONIX) 40 MG tablet, Take 1 tablet by mouth daily., Disp: , Rfl:  .  pravastatin (PRAVACHOL) 80 MG tablet, TAKE 1 TABLET BY MOUTH EVERY DAY, Disp: 90 tablet, Rfl: 3 .  Tamsulosin HCl (FLOMAX) 0.4 MG CAPS, Take 0.4 mg by mouth at bedtime. , Disp: , Rfl:  .  warfarin (COUMADIN) 2.5 MG tablet, Take 1 tablet by mouth daily. Use as directed, Disp: , Rfl: 1

## 2016-02-09 NOTE — Assessment & Plan Note (Signed)
He had a barium swallow test which showed moderate dysmotility and some laryngeal penetration which led to a cough reflex. I do worry that the cough is due primarily to acid reflux given his extensive history from esophageal dysmotility and dysphagia in the past. The right lower lobe findings would be consistent with this as that's typically where aspirated gastric contents 10 to go.  At this time I'm not really convinced that amiodarone has anything to do with his respiratory complaints.  Plan summary Follow-up with GI next week regarding his acid reflux and dysphagia with esophageal dysmotility I'm going to schedule the CT scan for next week Depending on the findings of the CT scan he may need to have a bronchoscopy to evaluate what I think is right lower lobe collapse versus a thoracentesis if he still has a pleural effusion on that side. If he needs to have a procedure we will need to stop warfarin. F/u 4 weeks

## 2016-02-09 NOTE — Patient Instructions (Signed)
We will arrange for the CT scan to be performed next week Depending on the results of the CT we may need to schedule a bronchoscopy I will see you back in 4 weeks or sooner if needed

## 2016-02-09 NOTE — Assessment & Plan Note (Signed)
As above. His barium swallow showed some aspiration with esophageal dysmotility.

## 2016-02-12 ENCOUNTER — Encounter: Payer: Self-pay | Admitting: Gastroenterology

## 2016-02-12 ENCOUNTER — Ambulatory Visit (INDEPENDENT_AMBULATORY_CARE_PROVIDER_SITE_OTHER): Payer: PPO | Admitting: Gastroenterology

## 2016-02-12 VITALS — BP 150/80 | HR 82 | Ht 69.0 in | Wt 193.8 lb

## 2016-02-12 DIAGNOSIS — R1310 Dysphagia, unspecified: Secondary | ICD-10-CM

## 2016-02-12 DIAGNOSIS — R05 Cough: Secondary | ICD-10-CM | POA: Diagnosis not present

## 2016-02-12 DIAGNOSIS — E0580 Other thyrotoxicosis without thyrotoxic crisis or storm: Secondary | ICD-10-CM | POA: Diagnosis not present

## 2016-02-12 DIAGNOSIS — K5901 Slow transit constipation: Secondary | ICD-10-CM | POA: Diagnosis not present

## 2016-02-12 DIAGNOSIS — R059 Cough, unspecified: Secondary | ICD-10-CM

## 2016-02-12 NOTE — Patient Instructions (Addendum)
Today your blood pressure was elevated. Please follow up with your Primary Care Provider for blood pressure management.  If you are age 70 or older, your body mass index should be between 23-30. Your Body mass index is 28.61 kg/(m^2). If this is out of the aforementioned range listed, please consider follow up with your Primary Care Provider.  If you are age 64 or younger, your body mass index should be between 19-25. Your Body mass index is 28.61 kg/(m^2). If this is out of the aformentioned range listed, please consider follow up with your Primary Care Provider.   Thank you for choosing [facility]  Dr Wilfrid Lund III

## 2016-02-12 NOTE — Progress Notes (Signed)
[facility] Consult Note:  History: Benjamin Mckay 02/12/2016  Referring physician: No PCP Per Patient  Reason for consult/chief complaint: coughing and Aspiration   Subjective HPI:  This gentleman was referred to me for chronic cough. He previously saw Dr. Deatra Ina, last visit was an EGD in August 2012. At that time, a "early stricture" was found, minimal resistance with the passage of an 18 mm dilator. On this occasion, Benjamin Mckay reports about 3 months of intermittent postprandial coughing. He will come in these severe episodes, then he may not have any for up to a week. Her many years he is felt that food is "a little slow" passing into the upper esophagus. This problem seems to be worse after he was hospitalized for CHF in February. At that time chest x-ray suggested infiltrates, amiodarone was stopped out of concern for possible pulmonary toxicity. He recently saw Dr. Lake Bells from pulmonary, who doubted this was the cause. Recent chest x-ray showed a right lower lobe infiltrate and a possible small effusion. Bladder function testing is been done, CT scan of the chest is pending with a possible bronchoscopy to follow. Benjamin Mckay reports that the symptoms only occur during the day when he is upright, he has no nocturnal coughing or choking. He does not get pyrosis or regurgitation. He is been on Protonix 40 mg once a day since his recent hospital stay. Lastly, he reports about 2 months of stools being somewhat smaller with the need to strain. There is no rectal bleeding or dyschezia. Of note, his diuretics were resumed during the CHF visit.  ROS:  Review of Systems  Constitutional: Negative for appetite change and unexpected weight change.  HENT: Negative for mouth sores and voice change.   Eyes: Negative for pain and redness.  Respiratory: Positive for cough. Negative for shortness of breath.   Cardiovascular: Negative for chest pain and palpitations.  Genitourinary: Negative for  dysuria and hematuria.  Musculoskeletal: Negative for myalgias and arthralgias.  Skin: Negative for pallor and rash.  Neurological: Negative for weakness and headaches.  Hematological: Negative for adenopathy.   He denies a change in his vocal quality  Past Medical History: Past Medical History  Diagnosis Date  . Atrial fibrillation ([place])   . Lymphoma ([place]) 1998    of colon   . Diverticulosis   . Esophageal stricture   . Hyperlipidemia   . Hypertension   . CVA (cerebral infarction)   . Colon polyps   . Urinary frequency   . Umbilical hernia   . Congenital heart block   . CHB (complete heart block) ([place]) 11/06/2013  . Cardiomyopathy, ischemic 11/07/2013  . Chronic combined systolic and diastolic CHF (congestive heart failure) ([place])   . Hyperthyroidism 11/16/2015  . Colon polyps    He had a TIA years ago from A. fib, that is when his Coumadin was started. He has no residual neurologic deficit.  Past Surgical History: Past Surgical History  Procedure Laterality Date  . Appendectomy  1953  . Colectomy      for lymphoma  . Pacemaker placement      replaced 3 x  . Tonsillectomy    . Neck fusion    . Back surgery    . Carotid doppler  11/04/2012    Proximal Rt ICA 50-99% diameter reduction; Lft Bulb demonstrated mild amount homogeneous plaque-not hemodynamically significant; Lft ICA-normal patency.  . Pacemaker generator change  01/31/2012    [employer] DR RF model [id number] serial [phone]  .  Cardiac catheterization  01/06/2003    Recommend medical therapy  . Cardiovascular stress test  07/16/2012    Mild-moderate perfusion defect seen in Basal inferior, Mid inferior, and Apica lateral consistent with infarct/scar. No scintigraphic evidence for inducible myocardial ischemia. No ECG changes. EKG negative for ischemia.  . Transthoracic echocardiogram  11/04/2012    EF [id number], systolic function moderately reduced, mild regurg of the aortic and mitral valves.  . Permanent  pacemaker generator change N/A 01/31/2012    Procedure: PERMANENT PACEMAKER GENERATOR CHANGE;  Surgeon: Sanda Klein, MD;  Location: [facility]ATH [facility];      Family History: Family History  Problem Relation Age of Onset  . Prostate cancer Father   . Colon cancer Neg Hx   . Diabetes Maternal Grandmother   . Heart disease Mother   . Stroke Mother   . Heart attack Neg Hx     Social History: Social History   Social History  . Marital Status: Married    Spouse Name: [employer]  . Number of Children: 3  . Years of Education: BS   Occupational History  . Retired    Social History Main Topics  . Smoking status: Never Smoker   . Smokeless tobacco: Never Used  . Alcohol Use: No  . Drug Use: No  . Sexual Activity: Not Asked   Other Topics Concern  . None   Social History Narrative   Consumes caffeine 2 cups per day      Allergies: Allergies  Allergen Reactions  . Adhesive [Tape] Other (See Comments)    Skin irritation - please use paper tape  . Statins Other (See Comments)    Leg cramps.   Tolerates pravastatin.    Marland Kitchen Penicillins Rash    Has patient had a PCN reaction causing immediate rash, facial/tongue/throat swelling, SOB or lightheadedness with hypotension: Yes Has patient had a PCN reaction causing severe rash involving mucus membranes or skin necrosis: No Has patient had a PCN reaction that required hospitalization No Has patient had a PCN reaction occurring within the last 10 years: No If all of the above answers are "NO", then may proceed with Cephalosporin use.    Outpatient Meds: Current Outpatient Prescriptions  Medication Sig Dispense Refill  . furosemide (LASIX) 20 MG tablet TAKE 20 MG 4 TIMES WEEKLY 30 tablet 5  . hydrocortisone cream 1 % Apply 1 application topically 2 (two) times daily as needed for itching. Reported on 12/07/2015    . methimazole (TAPAZOLE) 10 MG tablet Take 60 mg by mouth daily.    . metoprolol succinate (TOPROL-XL) 25 MG 24 hr  tablet Take 1 tablet (25 mg total) by mouth daily. 30 tablet 11  . pantoprazole (PROTONIX) 40 MG tablet Take 1 tablet by mouth daily.    . pravastatin (PRAVACHOL) 80 MG tablet TAKE 1 TABLET BY MOUTH EVERY DAY 90 tablet 3  . Tamsulosin HCl (FLOMAX) 0.4 MG CAPS Take 0.4 mg by mouth at bedtime.     Marland Kitchen warfarin (COUMADIN) 2.5 MG tablet Take 1 tablet by mouth daily. Use as directed  1   No current facility-administered medications for this visit.      ___________________________________________________________________ Objective  Exam:  BP 150/80 mmHg  Pulse 82  Ht 5\' 9"  (1.753 m)  Wt 193 lb 12.8 oz (87.907 kg)  BMI 28.61 kg/m2   General: this is a(n) Well-appearing elderly man with good muscle mass, normal vocal quality.   Eyes: sclera anicteric, no redness  ENT: oral mucosa moist  without lesions, no cervical or supraclavicular lymphadenopathy, good dentition  CV: RRR without murmur, S1/S2, no JVD, no peripheral edema  Resp: Decreased breath sounds at the right base , normal RR and effort noted  GI: soft, no tenderness, with active bowel sounds. No guarding or palpable organomegaly noted.  Skin; warm and dry, no rash or jaundice noted  Neuro: awake, alert and oriented x 3. Normal gross motor function and fluent speech  Labs:   Lab Results  Component Value Date   WBC 8.3 02/05/2016   HGB 15.1 02/05/2016   HCT 43.0* 02/05/2016   MCV 90.9 02/05/2016   PLT 269 12/04/2015    Radiologic Studies:  CXR 02/05/16, RLL infiltrate with possible small effusion Barium swallow 01/11/16 - dysmotility and aspiration. MBS OK  Assessment: Encounter Diagnoses  Name Primary?  . Cough Yes  . Dysphagia   . Slow transit constipation     His oropharyngeal dysphagia and intermittent aspiration appears likely due to esophageal dysmotility. I am afraid I do not have any medicines or therapeutic procedures that I believe will help. He does not seem to have any other symptoms of a  neurologic disorder.  Plan: I recommend that he decrease his PPI to 3 times a week since this does not appear to be reflux related. If there is no change to 3 weeks after that, he can stop this medicine. Chin tuck maneuver advised during swallowing. Advice given for bowel regimen. I think the diuretic is leading to increased colonic water absorption and therefore constipation.  Thank you for the courtesy of this consult.  Please call me with any questions or concerns.  Nelida Meuse III

## 2016-02-14 ENCOUNTER — Ambulatory Visit
Admission: RE | Admit: 2016-02-14 | Discharge: 2016-02-14 | Disposition: A | Discharge status: Home / Self Care | Payer: PPO | Source: Ambulatory Visit | Attending: Pulmonary Disease | Admitting: Pulmonary Disease

## 2016-02-14 DIAGNOSIS — R911 Solitary pulmonary nodule: Secondary | ICD-10-CM | POA: Diagnosis not present

## 2016-02-15 ENCOUNTER — Other Ambulatory Visit: Payer: Self-pay

## 2016-02-15 DIAGNOSIS — H5703 Miosis: Secondary | ICD-10-CM | POA: Diagnosis not present

## 2016-02-15 DIAGNOSIS — H2511 Age-related nuclear cataract, right eye: Secondary | ICD-10-CM | POA: Diagnosis not present

## 2016-02-15 DIAGNOSIS — Z01812 Encounter for preprocedural laboratory examination: Secondary | ICD-10-CM

## 2016-02-17 ENCOUNTER — Telehealth: Payer: Self-pay | Admitting: Pulmonary Disease

## 2016-02-17 NOTE — Telephone Encounter (Signed)
Called by Mrs Toma who relates that her husband has increased cough. Cough is chronic, however, appears to be worse tonight. He has been seen in the PCCM office by Dr. Lake Bells who feels that the cough may be an element of gastric reflux. The case is complicated by a pleural effusion for which thoracentesis is planned next week and a history of CHF. I told Mrs. Cornwall that exact diagnosis over the telephone was not possible, However,  I suggested that the patient try an extra dose of Lasix tonight. If he became more short of breath or his cough interfered with sleep or his ability to function, Mrs. Bushell was instructed to bring him to the Emergency Department for further evaluation and possible admission to the hospital.

## 2016-02-19 ENCOUNTER — Other Ambulatory Visit (INDEPENDENT_AMBULATORY_CARE_PROVIDER_SITE_OTHER): Payer: PPO

## 2016-02-19 ENCOUNTER — Ambulatory Visit (INDEPENDENT_AMBULATORY_CARE_PROVIDER_SITE_OTHER): Payer: PPO | Admitting: Pharmacist Clinician (PhC)/ Clinical Pharmacy Specialist

## 2016-02-19 ENCOUNTER — Other Ambulatory Visit: Payer: Self-pay

## 2016-02-19 DIAGNOSIS — I4891 Unspecified atrial fibrillation: Secondary | ICD-10-CM | POA: Diagnosis not present

## 2016-02-19 DIAGNOSIS — Z7901 Long term (current) use of anticoagulants: Secondary | ICD-10-CM

## 2016-02-19 DIAGNOSIS — I482 Chronic atrial fibrillation, unspecified: Secondary | ICD-10-CM

## 2016-02-19 DIAGNOSIS — Z01812 Encounter for preprocedural laboratory examination: Secondary | ICD-10-CM | POA: Diagnosis not present

## 2016-02-19 DIAGNOSIS — I4821 Permanent atrial fibrillation: Secondary | ICD-10-CM

## 2016-02-19 LAB — PROTIME-INR
INR: 1.4 ratio — ABNORMAL HIGH (ref 0.8–1.0)
Prothrombin Time: 15.3 s — ABNORMAL HIGH (ref 9.6–13.1)

## 2016-02-19 LAB — POCT INR: INR: 1.4

## 2016-02-20 ENCOUNTER — Ambulatory Visit (HOSPITAL_COMMUNITY)
Admission: RE | Admit: 2016-02-20 | Discharge: 2016-02-20 | Disposition: A | Discharge status: Home / Self Care | Payer: PPO | Source: Ambulatory Visit | Attending: Pulmonary Disease | Admitting: Pulmonary Disease

## 2016-02-20 ENCOUNTER — Ambulatory Visit (HOSPITAL_COMMUNITY): Admission: RE | Admit: 2016-02-20 | Payer: PPO | Source: Ambulatory Visit

## 2016-02-20 DIAGNOSIS — J948 Other specified pleural conditions: Secondary | ICD-10-CM | POA: Diagnosis not present

## 2016-02-20 DIAGNOSIS — J90 Pleural effusion, not elsewhere classified: Secondary | ICD-10-CM | POA: Diagnosis not present

## 2016-02-20 DIAGNOSIS — I517 Cardiomegaly: Secondary | ICD-10-CM | POA: Diagnosis not present

## 2016-02-20 LAB — LACTATE DEHYDROGENASE, PLEURAL OR PERITONEAL FLUID: LD, Fluid: 87 U/L — ABNORMAL HIGH (ref 3–23)

## 2016-02-20 LAB — BODY FLUID CELL COUNT WITH DIFFERENTIAL
EOS FL: 58 %
Lymphs, Fluid: 14 %
Monocyte-Macrophage-Serous Fluid: 14 % — ABNORMAL LOW (ref 50–90)
NEUTROPHIL FLUID: 14 % (ref 0–25)
Total Nucleated Cell Count, Fluid: 325 cu mm (ref 0–1000)

## 2016-02-20 LAB — PROTEIN, BODY FLUID: TOTAL PROTEIN, FLUID: 3.9 g/dL

## 2016-02-20 LAB — LACTATE DEHYDROGENASE: LDH: 114 U/L (ref 98–192)

## 2016-02-20 LAB — PROTEIN, TOTAL: Total Protein: 6 g/dL — ABNORMAL LOW (ref 6.5–8.1)

## 2016-02-20 NOTE — Procedures (Signed)
Thoracentesis Procedure Note  Pre-operative Diagnosis: Large right pleural effusion   Post-operative Diagnosis: same  Indications: Diagnostic evaluation of pleural fluid & therapeutic relief of dyspnea.    Procedure Details  Consent: Informed consent was obtained. Risks of the procedure were discussed including: infection, bleeding, pain, pneumothorax.  Under sterile conditions the patient was positioned. Betadine solution and sterile drapes were utilized.  1% plain lidocaine was used to anesthetize the 8th rib space. Fluid was obtained without any difficulties and minimal blood loss.  A dressing was applied to the wound and wound care instructions were provided.   Findings 1500 ml of amber pleural fluid was obtained. A sample was sent for cell counts, GS, culture, LDH, protein and cytology.    Complications:  None; patient tolerated the procedure well.          Condition: stable  Plan A follow up chest x-ray was ordered. Tylenol 650 mg. for pain.   Procedure performed under direct supervision of Dr. Titus Mould and with ultrasound guidance.   Benjamin Gens, NP-C [facility] Pgr: [phone] or if no answer [phone] 02/20/2016, 11:23 AM      I supervised and was present for the entire procedure Did pre procedure [place] as well Tolerated well 1.5 cc amber clear fluid Post op pcxr ordered  Lavon Paganini. Titus Mould, MD, [place] Pgr: [facility]

## 2016-02-20 NOTE — Procedures (Signed)
[place] chest  1. LArge rt effusion, no fibrinous material , no loculations , 2 small lung flaps  Lavon Paganini. Titus Mould, MD, [place] Pgr: [facility]

## 2016-02-20 NOTE — Progress Notes (Signed)
Patient seen for thoracentesis.    Vitals:   Arrival:  SpO2 94%, HR 65, BP 170/87, RR 18 Post procedure:  SpO2 94%, HR 69, BP 119/65, RR 18 Discharge:  SpO2 96%, HR 67, BP 130/68, RR 18.  Labs labeled and sent to the lab.  Chest Xray shows no pneumothorax.  MD aware.    Patient discharged home with instructions given by NP.

## 2016-02-23 ENCOUNTER — Telehealth: Payer: Self-pay | Admitting: Pulmonary Disease

## 2016-02-23 LAB — BODY FLUID CULTURE
CULTURE: NO GROWTH
SPECIAL REQUESTS: NORMAL

## 2016-02-23 NOTE — Telephone Encounter (Signed)
Appointment has been scheduled. Nothing further was needed.

## 2016-02-23 NOTE — Telephone Encounter (Signed)
Spoke with the pt  He is scheduled to see TP on 02/26/16 at 11:15 am

## 2016-02-23 NOTE — Telephone Encounter (Signed)
I called Benjamin Mckay to let him know that there is no evidence of malignancy or infection from the pleural fluid we sampled. However, I'm still confused as to the cause of his ongoing cough and abnormal chest x-ray. My highest suspicion is that this is aspiration related. He claims that he is still coughing. I would like for him to come to our clinic next week to have a chest x-ray and see one of our nurse practitioners about his ongoing symptoms.. I believe he may need to have a bronchoscopy for an airway exam and BAL to assess this further (this could be performed on warfarin).  Will cc triage so they can help make an appointment.

## 2016-02-26 ENCOUNTER — Ambulatory Visit (INDEPENDENT_AMBULATORY_CARE_PROVIDER_SITE_OTHER)
Admission: RE | Admit: 2016-02-26 | Discharge: 2016-02-26 | Disposition: A | Discharge status: Home / Self Care | Payer: PPO | Source: Ambulatory Visit | Attending: Adult Health | Admitting: Adult Health

## 2016-02-26 ENCOUNTER — Encounter: Payer: Self-pay | Admitting: Adult Health

## 2016-02-26 ENCOUNTER — Ambulatory Visit (INDEPENDENT_AMBULATORY_CARE_PROVIDER_SITE_OTHER): Payer: PPO | Admitting: Adult Health

## 2016-02-26 VITALS — BP 132/84 | HR 66 | Temp 97.7°F | Ht 69.0 in | Wt 189.0 lb

## 2016-02-26 DIAGNOSIS — I5022 Chronic systolic (congestive) heart failure: Secondary | ICD-10-CM | POA: Diagnosis not present

## 2016-02-26 DIAGNOSIS — J90 Pleural effusion, not elsewhere classified: Secondary | ICD-10-CM | POA: Diagnosis not present

## 2016-02-26 DIAGNOSIS — J948 Other specified pleural conditions: Secondary | ICD-10-CM

## 2016-02-26 DIAGNOSIS — R05 Cough: Secondary | ICD-10-CM | POA: Diagnosis not present

## 2016-02-26 MED ORDER — LEVOFLOXACIN 750 MG PO TABS: 750.0000 mg | ORAL_TABLET | Freq: Every day | ORAL | Status: AC

## 2016-02-26 NOTE — Progress Notes (Signed)
Subjective:    Patient ID: Benjamin Mckay, male    DOB: 1946/08/18, 70 y.o.   MRN: [id number]  HPI 70  yo male never smoker with chronic CHF seen for pulmonary consult for cough during hospitalization 11/2015 .  Has Chronic A-Fib on Amiodarone/coumadin , Ischemic CM w/ pacemaker   TEST  11/2015 Echo EF 45-50%    02/26/2016 Follow up : Pleural Effusion  Pt returns for a 2 week follow up. Complains he has a persistent cough , has been present since January 2017. He is very frustrated, most concern is over cough .  Seen by GI 02/12/16 felt to have intermittent Dysphagia and intermittent aspiration. Likely due to esophageal dysmotility. He was recommended to continue on a protein pump inhibitor and to use aspiration precautions. Barium swallow on March 30 showed small amount of aspiration. Modified barium swallow April 6 showed no aspiration or penetration. Was seen in Urgent care on 4/24 , with increased cough . CXR showed a RLL opacity . Says he has not been on any abx . No fever or discolored mucus . He was taken off Amiodarone for possible lung toxicity.  Cough might have improved briefly but has not seen much change.  CT chest on 5/3 showed larger right pleural effusion with associated collapse/consolidation RML/RLL.  He was set up for a thoracentesis and 1.5L removed on 5/9 with exudative process noted.  Cytology was neg for malignant cells. Cx neg.  He returns today w/ very little dyspnea . Main issue is dry cough is not better.  CXR today shows progressive large right pleural effusion.  He denies chest pain, orthopnea, increased edema.  Wt has been stable with wt down 4lbs. .  Denies hemoptysis . He denies overt reflux or choking. Does feel he has to swallow twice sometimes.   Past Medical History  Diagnosis Date  . Atrial fibrillation ([place])   . Lymphoma ([place]) 1998    of colon   . Diverticulosis   . Esophageal stricture   . Hyperlipidemia   . Hypertension   . CVA (cerebral  infarction)   . Colon polyps   . Urinary frequency   . Umbilical hernia   . Congenital heart block   . CHB (complete heart block) ([place]) 11/06/2013  . Cardiomyopathy, ischemic 11/07/2013  . Chronic combined systolic and diastolic CHF (congestive heart failure) ([place])   . Hyperthyroidism 11/16/2015  . Colon polyps     Current Outpatient Prescriptions on File Prior to Visit  Medication Sig Dispense Refill  . furosemide (LASIX) 20 MG tablet TAKE 20 MG 4 TIMES WEEKLY 30 tablet 5  . hydrocortisone cream 1 % Apply 1 application topically 2 (two) times daily as needed for itching. Reported on 12/07/2015    . methimazole (TAPAZOLE) 10 MG tablet Take 60 mg by mouth daily.    . metoprolol succinate (TOPROL-XL) 25 MG 24 hr tablet Take 1 tablet (25 mg total) by mouth daily. 30 tablet 11  . pantoprazole (PROTONIX) 40 MG tablet Take 1 tablet by mouth daily.    . pravastatin (PRAVACHOL) 80 MG tablet TAKE 1 TABLET BY MOUTH EVERY DAY 90 tablet 3  . Tamsulosin HCl (FLOMAX) 0.4 MG CAPS Take 0.4 mg by mouth at bedtime.     Marland Kitchen warfarin (COUMADIN) 2.5 MG tablet Take 1 tablet by mouth daily. Use as directed  1   No current facility-administered medications on file prior to visit.      Review of Systems Constitutional:   No  weight loss, night sweats,  Fevers, chills, fatigue, or  lassitude.  HEENT:   No headaches,  Difficulty swallowing,  Tooth/dental problems, or  Sore throat,                No sneezing, itching, ear ache, nasal congestion, post nasal drip,   CV:  No chest pain,  Orthopnea, PND, , anasarca, dizziness, palpitations, syncope.   GI  No heartburn, indigestion, abdominal pain, nausea, vomiting, diarrhea, change in bowel habits, loss of appetite, bloody stools.   Resp:    No chest wall deformity  Skin: no rash or lesions.  GU: no dysuria, change in color of urine, no urgency or frequency.  No flank pain, no hematuria   MS:  No joint pain or swelling.  No decreased range of motion.  No back  pain.  Psych:  No change in mood or affect. No depression or anxiety.  No memory loss.         Objective:   Physical Exam Filed Vitals:   02/26/16 1120  BP: 132/84  Pulse: 66  Temp: 97.7 F (36.5 C)  TempSrc: Oral  Height: 5\' 9"  (1.753 m)  Weight: 189 lb (85.73 kg)  SpO2: 94%   GEN: A/Ox3; pleasant , NAD, elderly , ++dry cough   HEENT:  [place]/AT,  EACs-clear, TMs-wnl, NOSE-clear, THROAT-clear, no lesions, no postnasal drip or exudate noted.   NECK:  Supple w/ fair ROM; no JVD; normal carotid impulses w/o bruits; no thyromegaly or nodules palpated; no lymphadenopathy.  RESP  Decreased BS on Right no accessory muscle use, no dullness to percussion  CARD:  RRR, no m/r/g  , tr -1+ peripheral edema support stockings. pulses intact, no cyanosis or clubbing.  GI:   Soft & nt; nml bowel sounds; no organomegaly or masses detected.  Musco: Warm bil, no deformities or joint swelling noted.   Neuro: alert, no focal deficits noted.    Skin: Warm, no lesions or rashes        Assessment & Plan:  Finlee Milo NP-C  [facility]  02/26/2016

## 2016-02-26 NOTE — Patient Instructions (Signed)
Begin Levaquin 750mg  daily for 7 days .  Set up for therapeutic Thoracentesis on right pleural effusion .  Hold coumadin for 4 days prior to thoracentesis. .  Check INR day before thoracentesis.  Follow up 10 days with Dr. Lake Bells or Tomicka Lover NP with chest xray .  Please contact office for sooner follow up if symptoms do not improve or worsen or seek emergency care

## 2016-02-26 NOTE — Assessment & Plan Note (Signed)
Recurrent :Large Right Pleural Effusion ? Etiology  Possible this is aspiration related with intermittent aspiration causing a Pneumonitis w/ pleural effusion  Cytology was neg for malignant cells.  He will require repeat thoracentesis , will set up for therapeutic tap w/ only 1.5L removal for comfort.  Add abx with Levaqin x 7 days .  Discussed referral to thoracic surgery for evaluation - he is strongly against this at this time . Explained that we will get  Close follow up next week with cxr , if reaccumulates will need referral at that time -which is highly likely  Plan  Begin Levaquin 750mg  daily for 7 days .  Set up for therapeutic Thoracentesis on right pleural effusion .  Hold coumadin for 4 days prior to thoracentesis. .  Check INR day before thoracentesis.  Follow up 10 days with Dr. Lake Bells or Parrett NP with chest xray .  Please contact office for sooner follow up if symptoms do not improve or worsen or seek emergency care

## 2016-02-26 NOTE — Assessment & Plan Note (Signed)
Appear euvolemic without evidence of vol overload on exam.

## 2016-02-27 NOTE — Progress Notes (Signed)
Reviewed and discussed with Tammy Parrett.  It's not clear to me what is causing this exudative effusion as cytology and cultures are negative.  Could he be aspirating?  Nevertheless given quick recurrence and symptoms we will perform a thoracentesis again (through radiology) and refer to thoracic surgery for pleural biopsy and likely pleuridesis.

## 2016-02-28 NOTE — Progress Notes (Signed)
OK strange.  If he is still resistant to surgery and if the fluid is coming back next week we could try steroids but in my experience this often doesn't work.

## 2016-03-04 ENCOUNTER — Other Ambulatory Visit (INDEPENDENT_AMBULATORY_CARE_PROVIDER_SITE_OTHER): Payer: PPO

## 2016-03-04 DIAGNOSIS — J90 Pleural effusion, not elsewhere classified: Secondary | ICD-10-CM

## 2016-03-04 LAB — PROTIME-INR
INR: 1.6 ratio — AB (ref 0.8–1.0)
PROTHROMBIN TIME: 17.2 s — AB (ref 9.6–13.1)

## 2016-03-05 ENCOUNTER — Other Ambulatory Visit: Payer: PPO

## 2016-03-05 ENCOUNTER — Ambulatory Visit (HOSPITAL_COMMUNITY)
Admission: RE | Admit: 2016-03-05 | Discharge: 2016-03-05 | Disposition: A | Discharge status: Home / Self Care | Payer: PPO | Source: Ambulatory Visit | Attending: Pulmonary Disease | Admitting: Pulmonary Disease

## 2016-03-05 DIAGNOSIS — J948 Other specified pleural conditions: Secondary | ICD-10-CM | POA: Diagnosis not present

## 2016-03-05 DIAGNOSIS — J90 Pleural effusion, not elsewhere classified: Secondary | ICD-10-CM

## 2016-03-05 DIAGNOSIS — Z9889 Other specified postprocedural states: Secondary | ICD-10-CM

## 2016-03-05 LAB — LACTATE DEHYDROGENASE, PLEURAL OR PERITONEAL FLUID: LD FL: 84 U/L — AB (ref 3–23)

## 2016-03-05 LAB — GRAM STAIN

## 2016-03-05 LAB — BODY FLUID CELL COUNT WITH DIFFERENTIAL
EOS FL: 48 %
Lymphs, Fluid: 1 %
MONOCYTE-MACROPHAGE-SEROUS FLUID: 45 % — AB (ref 50–90)
Neutrophil Count, Fluid: 6 % (ref 0–25)
WBC FLUID: 147 uL (ref 0–1000)

## 2016-03-05 LAB — PROTEIN, BODY FLUID: TOTAL PROTEIN, FLUID: 4.2 g/dL

## 2016-03-05 NOTE — Progress Notes (Signed)
Patient seen for thoracentesis.    Preprocedure vitals:  HR 108, SPO2 94%, RR 22, BP 207/105. Post vitals:  HR 68, SpO2 94%, RR 23, BP 135/61.  Chest xray showed no pneumothorax after procedure.  Samples sent to the lab.  MD aware of xray results.  Patient discharged to home.

## 2016-03-05 NOTE — Procedures (Signed)
Thoracentesis Procedure Note  Pre-operative Diagnosis: Pleural effusion  Post-operative Diagnosis: same  Indications: Pleural effusion  Procedure Details  Consent: Informed consent was obtained. Risks of the procedure were discussed including: infection, bleeding, pain, pneumothorax.  Under sterile conditions the patient was positioned. Betadine solution and sterile drapes were utilized.  1% buffered lidocaine was used to anesthetize the 6 rib space. Fluid was obtained without any difficulties and minimal blood loss.  A dressing was applied to the wound and wound care instructions were provided.   Findings 2100 ml of cloudy amber pleural fluid was obtained. A sample was sent to Pathology for cytogenetics, flow, and cell counts, as well as for infection analysis.  Complications:  None; patient tolerated the procedure well.          Condition: stable  Plan A follow up chest x-ray was ordered. Bed Rest for 0 hours. Tylenol 650 mg. for pain.  Attending Attestation: I was present for the entire procedure.  Rush Farmer, M.D. [facility]. Pager: [phone]. After hours pager: [phone].

## 2016-03-07 LAB — CHOLESTEROL, BODY FLUID: Cholesterol, Fluid: 118 mg/dL

## 2016-03-07 LAB — PH, BODY FLUID: pH, Body Fluid: 8.2

## 2016-03-08 ENCOUNTER — Ambulatory Visit (INDEPENDENT_AMBULATORY_CARE_PROVIDER_SITE_OTHER): Payer: PPO | Admitting: Pulmonary Disease

## 2016-03-08 ENCOUNTER — Other Ambulatory Visit: Payer: Self-pay | Admitting: Pharmacist Clinician (PhC)/ Clinical Pharmacy Specialist

## 2016-03-08 ENCOUNTER — Ambulatory Visit (INDEPENDENT_AMBULATORY_CARE_PROVIDER_SITE_OTHER): Payer: PPO | Admitting: Cardiovascular Disease

## 2016-03-08 ENCOUNTER — Encounter: Payer: Self-pay | Admitting: Cardiovascular Disease

## 2016-03-08 ENCOUNTER — Ambulatory Visit (INDEPENDENT_AMBULATORY_CARE_PROVIDER_SITE_OTHER): Payer: PPO | Admitting: Pharmacist Clinician (PhC)/ Clinical Pharmacy Specialist

## 2016-03-08 ENCOUNTER — Encounter: Payer: Self-pay | Admitting: Pulmonary Disease

## 2016-03-08 VITALS — BP 126/74 | HR 69 | Ht 69.0 in | Wt 186.8 lb

## 2016-03-08 VITALS — BP 134/80 | HR 72 | Ht 69.0 in | Wt 186.8 lb

## 2016-03-08 DIAGNOSIS — I482 Chronic atrial fibrillation, unspecified: Secondary | ICD-10-CM

## 2016-03-08 DIAGNOSIS — R05 Cough: Secondary | ICD-10-CM | POA: Diagnosis not present

## 2016-03-08 DIAGNOSIS — E0580 Other thyrotoxicosis without thyrotoxic crisis or storm: Secondary | ICD-10-CM

## 2016-03-08 DIAGNOSIS — E785 Hyperlipidemia, unspecified: Secondary | ICD-10-CM

## 2016-03-08 DIAGNOSIS — Z7901 Long term (current) use of anticoagulants: Secondary | ICD-10-CM

## 2016-03-08 DIAGNOSIS — I2510 Atherosclerotic heart disease of native coronary artery without angina pectoris: Secondary | ICD-10-CM

## 2016-03-08 DIAGNOSIS — I4891 Unspecified atrial fibrillation: Secondary | ICD-10-CM | POA: Diagnosis not present

## 2016-03-08 DIAGNOSIS — J90 Pleural effusion, not elsewhere classified: Secondary | ICD-10-CM

## 2016-03-08 DIAGNOSIS — J948 Other specified pleural conditions: Secondary | ICD-10-CM

## 2016-03-08 DIAGNOSIS — Z8679 Personal history of other diseases of the circulatory system: Secondary | ICD-10-CM

## 2016-03-08 DIAGNOSIS — R059 Cough, unspecified: Secondary | ICD-10-CM

## 2016-03-08 DIAGNOSIS — I6523 Occlusion and stenosis of bilateral carotid arteries: Secondary | ICD-10-CM

## 2016-03-08 DIAGNOSIS — I442 Atrioventricular block, complete: Secondary | ICD-10-CM | POA: Diagnosis not present

## 2016-03-08 DIAGNOSIS — Z950 Presence of cardiac pacemaker: Secondary | ICD-10-CM

## 2016-03-08 DIAGNOSIS — I701 Atherosclerosis of renal artery: Secondary | ICD-10-CM

## 2016-03-08 DIAGNOSIS — I5022 Chronic systolic (congestive) heart failure: Secondary | ICD-10-CM

## 2016-03-08 DIAGNOSIS — I10 Essential (primary) hypertension: Secondary | ICD-10-CM

## 2016-03-08 DIAGNOSIS — G4700 Insomnia, unspecified: Secondary | ICD-10-CM

## 2016-03-08 DIAGNOSIS — I4821 Permanent atrial fibrillation: Secondary | ICD-10-CM

## 2016-03-08 LAB — POCT INR: INR: 1.6

## 2016-03-08 MED ORDER — WARFARIN SODIUM 2.5 MG PO TABS: ORAL_TABLET | ORAL | Status: DC

## 2016-03-08 MED ORDER — TRAZODONE HCL 50 MG PO TABS: 50.0000 mg | ORAL_TABLET | Freq: Every evening | ORAL | Status: DC | PRN

## 2016-03-08 NOTE — Assessment & Plan Note (Signed)
He is having increasing insomnia recently.  Plan: Trazodone daily at bedtime

## 2016-03-08 NOTE — Assessment & Plan Note (Signed)
He has been experiencing a recurrent exudative pleural effusion for the last several weeks. We have drained it now twice, most recently with 2.1 L off. Today on exam he clearly has persistence of the effusion.  Bed cytology studies have been negative.  I explained to him today that I'm not certain why this is recurring. One possibility is that he is having recurrent aspiration pneumonia, the cultures have all been negative. I explained to him that the differential diagnosis is broad and includes malignancy. I do not think a good approach would be to try to treat this with steroids to see if it goes away because of the size of the effusion and its rapid reaccumulation.  Plan: I believe the next best step is to see thoracic surgery for VATS pleurodesis, pleural biopsy and drainage.

## 2016-03-08 NOTE — Assessment & Plan Note (Signed)
This has been persistent and I think is related to esophageal dysmotility, but clearly the large pleural effusion is likely contribute in.  After we have definitive drainage of the large right-sided pleural effusion we need to repeat his CT scan of his chest to see if there is evidence of persistent underlying lung disease.

## 2016-03-08 NOTE — Progress Notes (Signed)
Subjective:    Patient ID: Benjamin Mckay, male    DOB: [birth year], 70 y.o.   MRN: [id number]  Synopsis: This gentleman has systolic heart failure and a history of complete heart block with atrial fibrillation he takes amiodarone and warfarin and was sent to the [facility] pulmonary clinic for evaluation of cough in February 2017. Ulnar function testing was ordered but had not been completed yet. He also had a high-resolution CT chest ordered.  HPI Chief Complaint  Patient presents with  . Follow-up    follow up post thoracentesis.  Pt c/o sob, sometimes prod cough with white mucus.     Dr. Nelda Marseille removed 2.1 L of fluid from his right chest last week which really helped him breathe better. He is still coughing, coughing up thick white mucus.  He is not choking on food Not losing weight or having fevers or chills. He is feels like the dyspnea may be coming back in the next few days.   The cough is still a problem for him and keeps his family up at night. He is having a lot of insomnia.  Past Medical History  Diagnosis Date  . Atrial fibrillation ([place])   . Lymphoma ([place]) 1998    of colon   . Diverticulosis   . Esophageal stricture   . Hyperlipidemia   . Hypertension   . CVA (cerebral infarction)   . Colon polyps   . Urinary frequency   . Umbilical hernia   . Congenital heart block   . CHB (complete heart block) ([place]) 11/06/2013  . Cardiomyopathy, ischemic 11/07/2013  . Chronic combined systolic and diastolic CHF (congestive heart failure) ([place])   . Hyperthyroidism 11/16/2015  . Colon polyps       Review of Systems  Constitutional: Negative for fever, chills and fatigue.  HENT: Negative for postnasal drip, rhinorrhea and sinus pressure.   Respiratory: Positive for cough. Negative for shortness of breath and wheezing.   Cardiovascular: Negative for chest pain, palpitations and leg swelling.       Objective:   Physical Exam Filed Vitals:   03/08/16 1048  BP: 126/74    Pulse: 69  Height: 5\' 9"  (1.753 m)  Weight: 186 lb 12.8 oz (84.732 kg)  SpO2: 96%    Gen: well appearing HENT: OP clear, TM's clear, neck supple PULM: diminished RLL, diminishe percussion RLL, normal on left CV: RRR, no mgr, trace edema GI: BS+, soft, nontender Derm: no cyanosis or rash Psyche: normal mood and affect    Images from this weeks chest x-ray showed persistent pleural effusion after thoracentesis Records from my nurse practitioner reviewed were she was referring him for thoracentesis      Assessment & Plan:  Insomnia He is having increasing insomnia recently.  Plan: Trazodone daily at bedtime  Cough This has been persistent and I think is related to esophageal dysmotility, but clearly the large pleural effusion is likely contribute in.  After we have definitive drainage of the large right-sided pleural effusion we need to repeat his CT scan of his chest to see if there is evidence of persistent underlying lung disease.  Pleural effusion, right He has been experiencing a recurrent exudative pleural effusion for the last several weeks. We have drained it now twice, most recently with 2.1 L off. Today on exam he clearly has persistence of the effusion.  Bed cytology studies have been negative.  I explained to him today that I'm not certain why this is recurring. One possibility  is that he is having recurrent aspiration pneumonia, the cultures have all been negative. I explained to him that the differential diagnosis is broad and includes malignancy. I do not think a good approach would be to try to treat this with steroids to see if it goes away because of the size of the effusion and its rapid reaccumulation.  Plan: I believe the next best step is to see thoracic surgery for VATS pleurodesis, pleural biopsy and drainage.     Current outpatient prescriptions:  .  furosemide (LASIX) 20 MG tablet, TAKE 20 MG 4 TIMES WEEKLY, Disp: 30 tablet, Rfl: 5 .   hydrocortisone cream 1 %, Apply 1 application topically 2 (two) times daily as needed for itching. Reported on 12/07/2015, Disp: , Rfl:  .  methimazole (TAPAZOLE) 10 MG tablet, Take 60 mg by mouth daily., Disp: , Rfl:  .  metoprolol succinate (TOPROL-XL) 25 MG 24 hr tablet, Take 1 tablet (25 mg total) by mouth daily., Disp: 30 tablet, Rfl: 11 .  pantoprazole (PROTONIX) 40 MG tablet, Take 1 tablet by mouth daily., Disp: , Rfl:  .  pravastatin (PRAVACHOL) 80 MG tablet, TAKE 1 TABLET BY MOUTH EVERY DAY, Disp: 90 tablet, Rfl: 3 .  Tamsulosin HCl (FLOMAX) 0.4 MG CAPS, Take 0.4 mg by mouth at bedtime. , Disp: , Rfl:  .  warfarin (COUMADIN) 2.5 MG tablet, Take 1/2 -1 tablet by mouth daily as directed by coumadin clinic, Disp: 90 tablet, Rfl: 1 .  traZODone (DESYREL) 50 MG tablet, Take 1 tablet (50 mg total) by mouth at bedtime as needed for sleep., Disp: 30 tablet, Rfl: 2

## 2016-03-08 NOTE — Patient Instructions (Signed)
We will refer you to thoracic surgery for drainage of the fluid, a pleural biopsy, and pleurodesis. We will see you back in 3-4 weeks or sooner if needed X line lead me know if you're having increasing shortness of breath and we can arrange for a temporary drainage of the fluid if needed.

## 2016-03-08 NOTE — Patient Instructions (Signed)
Your physician recommends that you continue on your current medications as directed. Please refer to the Current Medication list given to you today.  Dr Sallyanne Kuster recommends that you schedule a follow-up appointment in 3 months with a pacemaker check.  If you need a refill on your cardiac medications before your next appointment, please call your pharmacy.

## 2016-03-09 DIAGNOSIS — J90 Pleural effusion, not elsewhere classified: Secondary | ICD-10-CM | POA: Insufficient documentation

## 2016-03-09 DIAGNOSIS — E0580 Other thyrotoxicosis without thyrotoxic crisis or storm: Secondary | ICD-10-CM | POA: Insufficient documentation

## 2016-03-09 NOTE — Progress Notes (Signed)
Patient ID: Benjamin Mckay, male   DOB: June 20, 1946, 70 y.o.   MRN: [id number] Patient ID: Benjamin Mckay [facility], male   DOB: 09-08-1946, 70 y.o.   MRN: [id number]    Cardiology Office Note    Date:  03/09/2016   ID:  Benjamin Mckay, DOB [birth year], MRN [id number]  PCP:  No PCP Per Patient  Cardiologist:   Sanda Klein, MD   Chief complaint: weakness, weight loss   History of Present Illness:  Benjamin Mckay is a 71 y.o. male with complex cardiac history (complete heart block-pacemaker dependent, chronic atrial fibrillation versus atrial standstill, history of sustained ventricular tachycardia and near syncope, coronary artery disease with history of inferior wall scar due to myocardial infarction, renal and carotid artery stenosis) now with complications related to amiodarone induced hyperthyroidism And a recurrent right pleural effusion.  He underwent thoracentesis on May 9 and May 23 and has exudative fluid, but the cytology shows only "reactive mesothelial cells". His breathing improves after each thoracentesis, but he feels that he is reaccumulating effusion as he is developing a little more exertional dyspnea again. He is seeing Dr. Lake Bells. There is a plan for possible treatment with steroids, and he is not optimistic that this will make a difference. The next step would probably be referral for thoracic surgery with pleural biopsy and pleurodesis.  I don't have his most recent lab results, but his endocrinologist told him that he is "getting better". Benjamin Mckay has started to regain weight, has less insomnia, irritability and heat intolerance, all consistent with improving hyperthyroidism.  He no longer has any edema and denies dyspnea at rest. He has not had palpitations or syncope. He denies claudication or any focal neurological events or bleeding problems.   Interrogation of his pacemaker (Benjamin Mckay, 2013) shows no episodes of ventricular tachycardia. As always he has 100%  ventricular pacing. Device function is normal. The current generator was a change out implant in 2013 and has an estimated longevity of 7-11.7 years. He does have an escape rhythm in the 30s. Ventricular lead parameters are excellent with pacing threshold 0.625 V at 0.5 ms, sensing R waves greater than 12 mV and impedance 450 ohms.  His echocardiogram shows no visible atrial activity and 100% ventricular pacing. QTC is 602 ms.  Past Medical History  Diagnosis Date  . Atrial fibrillation ([place])   . Lymphoma ([place]) 1998    of colon   . Diverticulosis   . Esophageal stricture   . Hyperlipidemia   . Hypertension   . CVA (cerebral infarction)   . Colon polyps   . Urinary frequency   . Umbilical hernia   . Congenital heart block   . CHB (complete heart block) ([place]) 11/06/2013  . Cardiomyopathy, ischemic 11/07/2013  . Chronic combined systolic and diastolic CHF (congestive heart failure) ([place])   . Hyperthyroidism 11/16/2015  . Colon polyps     Past Surgical History  Procedure Laterality Date  . Appendectomy  1953  . Colectomy      for lymphoma  . Pacemaker placement      replaced 3 x  . Tonsillectomy    . Neck fusion    . Back surgery    . Carotid doppler  11/04/2012    Proximal Rt ICA 50-99% diameter reduction; Lft Bulb demonstrated mild amount homogeneous plaque-not hemodynamically significant; Lft ICA-normal patency.  . Pacemaker generator change  01/31/2012    St Jude Med [employer] DR RF model [id number] serial [id number]  .  Cardiac catheterization  01/06/2003    Recommend medical therapy  . Cardiovascular stress test  07/16/2012    Mild-moderate perfusion defect seen in Basal inferior, Mid inferior, and Apica lateral consistent with infarct/scar. No scintigraphic evidence for inducible myocardial ischemia. No ECG changes. EKG negative for ischemia.  . Transthoracic echocardiogram  11/04/2012    EF 123456, systolic function moderately reduced, mild regurg of the aortic and mitral valves.  .  Permanent pacemaker generator change N/A 01/31/2012    Procedure: PERMANENT PACEMAKER GENERATOR CHANGE;  Surgeon: Sanda Klein, MD;  Location: [place] CATH LAB;     Current Medications: Outpatient Prescriptions Prior to Visit  Medication Sig Dispense Refill  . furosemide (LASIX) 20 MG tablet TAKE 20 MG 4 TIMES WEEKLY 30 tablet 5  . hydrocortisone cream 1 % Apply 1 application topically 2 (two) times daily as needed for itching. Reported on 12/07/2015    . methimazole (TAPAZOLE) 10 MG tablet Take 60 mg by mouth daily.    . metoprolol succinate (TOPROL-XL) 25 MG 24 hr tablet Take 1 tablet (25 mg total) by mouth daily. 30 tablet 11  . pantoprazole (PROTONIX) 40 MG tablet Take 1 tablet by mouth daily.    . pravastatin (PRAVACHOL) 80 MG tablet TAKE 1 TABLET BY MOUTH EVERY DAY 90 tablet 3  . Tamsulosin HCl (FLOMAX) 0.4 MG CAPS Take 0.4 mg by mouth at bedtime.     Marland Kitchen warfarin (COUMADIN) 2.5 MG tablet Take 1 tablet by mouth daily. Use as directed  1   No facility-administered medications prior to visit.     Allergies:   Adhesive; Statins; and Penicillins   Social History   Social History  . Marital Status: Married    Spouse Name: [employer]  . Number of Children: 3  . Years of Education: BS   Occupational History  . Retired    Social History Main Topics  . Smoking status: Never Smoker   . Smokeless tobacco: Never Used  . Alcohol Use: No  . Drug Use: No  . Sexual Activity: Not Asked   Other Topics Concern  . None   Social History Narrative   Consumes caffeine 2 cups per day       Family History:  The patient's family history includes Diabetes in his maternal grandmother; Heart disease in his mother; Prostate cancer in his father; Stroke in his mother. There is no history of Colon cancer or Heart attack.   ROS:   Please see the history of present illness.    ROS All other systems reviewed and are negative.   PHYSICAL EXAM:   VS:  BP 134/80 mmHg  Pulse 72  Ht 5\' 9"  (1.753  m)  Wt 84.732 kg (186 lb 12.8 oz)  BMI 27.57 kg/m2   GEN: Well nourished, well developed, in no acute distress HEENT: normal Neck: no JVD, carotid bruits, or masses Cardiac: Paradoxically split second heart sound, RRR; no murmurs, rubs, or gallops,no edema , healthy pacemaker site Respiratory:  clear to auscultation bilaterally, normal work of breathing GI: soft, nontender, nondistended, + BS MS: no deformity or atrophy Skin: warm and dry, no rash Neuro:  Alert and Oriented x 3, Strength and sensation are intact Psych: euthymic mood, full affect  Wt Readings from Last 3 Encounters:  03/08/16 84.732 kg (186 lb 12.8 oz)  03/08/16 84.732 kg (186 lb 12.8 oz)  02/26/16 85.73 kg (189 lb)      Studies/Labs Reviewed:   EKG:  EKG is not ordered today.  Recent Labs: 11/18/2015: TSH 0.047* 12/04/2015: Brain Natriuretic Peptide 468.8*; Platelets 269 02/05/2016: ALT 8*; BUN 14; Creat 0.74; Hemoglobin 15.1; Potassium 4.5; Sodium 135   Lipid Panel    Component Value Date/Time   CHOL 205* 12/08/2014 0235   TRIG 143 12/08/2014 0235   HDL 32* 12/08/2014 0235   CHOLHDL 6.4 12/08/2014 0235   VLDL 29 12/08/2014 0235   LDLCALC 144* 12/08/2014 0235     ASSESSMENT:    1. Chronic systolic congestive heart failure ([place])   2. CHB (complete heart block) ([facility])   3. Chronic atrial fibrillation ([place])   4. Coronary artery disease involving native coronary artery of native heart without angina pectoris   5. Essential hypertension   6. Single chamber St. Jude pacemaker 2013   7. History of sustained ventricular tachycardia   8. Amiodarone-induced hyperthyroidism   9. Long term (current) use of anticoagulants   10. Renal artery stenosis ([place])   11. Carotid stenosis, bilateral   12. Recurrent right pleural effusion   13. HLD      PLAN:  In order of problems listed above:  1. CHF: Appears to be at euvolemic state. His effusion is exudative and reaccumulate rapidly it is not due to heart  failure We'll continue current dose of diuretic. NYHA functional class I-II (the effusion seems to be the cause for his exertional dyspnea). Most recent echo in February shows ejection fraction of 45-50%. Repeat echo interpretation of "high ventricular filling pressure" is less reliable due to his arrhythmia. He did have moderate pulmonary hypertension with estimated PA pressure 54 mmHg. 2. CHB: Pacemaker dependent. Upgrade to CRT device has been discussed but felt to be high-risk due to his multiple previous surgical procedures and increased risk of infection 3. AFib: CHADSVasc 4 (age, CHF, CAD, HTN). Warfarin anticoagulation without bleeding complications and without history of stroke/TIA. 4. CAD: Scar of previous inferior wall myocardial infarction, currently free of angina pectoris. 5. HTN: Well controlled 6. PPM: Normal device function. Had transient high output mode, now back to normal CAPTURE mode. Will perform a download in one month to look for evidence of ventricular tachycardia as we decrease the dose of amiodarone. 7. VT: None has been recorded since he was started on treatment with amiodarone in 2014 for an episode of near syncope and sustained monomorphic VT with a fairly slow cycle length of 370 ms (inferior scar VT?). We'll monitor his pacemaker monthly for evidence of recurrent VT as we let the amiodarone washout. 8. Amiodarone-induced  hyperthyroidism: He has another appointment for labs next week 9. Anticoagulation: on warfarin 10. RAS:  Normal renal function and blood pressure  11. Carotid stenosis: Rechecked 12/2015, mild progression, reevaluate in 6/12 months.  12. Exudative effusion of unclear etiology, may need biopsy/pleurodesis 13. Recheck lipids once he is euthyroid    Medication Adjustments/Labs and Tests Ordered: Current medicines are reviewed at length with the patient today.  Concerns regarding medicines are outlined above.  Medication changes, Labs and Tests ordered  today are listed in the Patient Instructions below. Patient Instructions  Your physician recommends that you continue on your current medications as directed. Please refer to the Current Medication list given to you today.  Dr Sallyanne Kuster recommends that you schedule a follow-up appointment in 3 months with a pacemaker check.  If you need a refill on your cardiac medications before your next appointment, please call your pharmacy.     Signed, Sanda Klein, MD  03/09/2016 9:03 AM    [name] [address], [place], [place]  [address] Phone: [phone]; Fax: [phone]

## 2016-03-10 LAB — CULTURE, BODY FLUID W GRAM STAIN -BOTTLE: Culture: NO GROWTH

## 2016-03-10 LAB — CULTURE, BODY FLUID-BOTTLE

## 2016-03-12 ENCOUNTER — Ambulatory Visit: Payer: PPO | Admitting: Pulmonary Disease

## 2016-03-12 ENCOUNTER — Telehealth: Payer: Self-pay | Admitting: Pulmonary Disease

## 2016-03-12 DIAGNOSIS — R0602 Shortness of breath: Secondary | ICD-10-CM

## 2016-03-12 DIAGNOSIS — R05 Cough: Secondary | ICD-10-CM

## 2016-03-12 DIAGNOSIS — R059 Cough, unspecified: Secondary | ICD-10-CM

## 2016-03-12 NOTE — Telephone Encounter (Signed)
Yes, he needs another therapeutic thoracentesis, ultrasound guided.  Please order through radiology, only test needed is cytology.

## 2016-03-12 NOTE — Telephone Encounter (Signed)
LM for pt x 1  

## 2016-03-12 NOTE — Telephone Encounter (Signed)
Patient returning call. Please call back

## 2016-03-12 NOTE — Telephone Encounter (Signed)
Called spoke with pt. Informed him of BQ's recs. He states that he called Dr. Everrett Coombe office and received an ov tomorrow around 12pm. He states that he will call our office to let us know what took place at the appointment. He voiced understanding and had no further questions. Order has been placed.

## 2016-03-12 NOTE — Telephone Encounter (Signed)
Last seen by BQ on 03/08/16 with the following instructions:  Patient Instructions     We will refer you to thoracic surgery for drainage of the fluid, a pleural biopsy, and pleurodesis. We will see you back in 3-4 weeks or sooner if needed X line lead me know if you're having increasing shortness of breath and we can arrange for a temporary drainage of the fluid if needed.   -----  Spoke with pt.  Appt with Dr. Servando Snare is on 03/20/16.  Pt states SOB is gradually worsening since OV with BQ on 5/26.  Cough is unchanged.  Would like to know if something should be done prior to pending appt with Dr. Servando Snare.  Notes he is on coumadin.  Dr. Lake Bells, please advise.  Thank you.

## 2016-03-13 ENCOUNTER — Encounter: Payer: Self-pay | Admitting: Cardiothoracic Surgery

## 2016-03-13 ENCOUNTER — Telehealth: Payer: Self-pay | Admitting: Pulmonary Disease

## 2016-03-13 ENCOUNTER — Other Ambulatory Visit: Payer: Self-pay | Admitting: *Deleted

## 2016-03-13 ENCOUNTER — Other Ambulatory Visit: Payer: Self-pay | Admitting: Pulmonary Disease

## 2016-03-13 ENCOUNTER — Telehealth: Payer: Self-pay | Admitting: Cardiovascular Disease

## 2016-03-13 ENCOUNTER — Institutional Professional Consult (permissible substitution) (INDEPENDENT_AMBULATORY_CARE_PROVIDER_SITE_OTHER): Payer: PPO | Admitting: Cardiothoracic Surgery

## 2016-03-13 VITALS — BP 117/78 | HR 67 | Resp 20 | Ht 69.0 in | Wt 186.0 lb

## 2016-03-13 DIAGNOSIS — R0602 Shortness of breath: Secondary | ICD-10-CM

## 2016-03-13 DIAGNOSIS — I255 Ischemic cardiomyopathy: Secondary | ICD-10-CM

## 2016-03-13 DIAGNOSIS — R05 Cough: Secondary | ICD-10-CM

## 2016-03-13 DIAGNOSIS — J948 Other specified pleural conditions: Secondary | ICD-10-CM

## 2016-03-13 DIAGNOSIS — I481 Persistent atrial fibrillation: Secondary | ICD-10-CM

## 2016-03-13 DIAGNOSIS — J90 Pleural effusion, not elsewhere classified: Secondary | ICD-10-CM

## 2016-03-13 DIAGNOSIS — R059 Cough, unspecified: Secondary | ICD-10-CM

## 2016-03-13 DIAGNOSIS — I4819 Other persistent atrial fibrillation: Secondary | ICD-10-CM

## 2016-03-13 NOTE — Telephone Encounter (Signed)
New message     Calling to give the nurse update info on thoracic surgeon's ov from today and talk about upcoming procedure

## 2016-03-13 NOTE — Telephone Encounter (Signed)
Called and asked for patient - pt asleep, spoke to wife (DPR contact). She informs me Dr. Prescott Gum saw pt today, pt is scheduled for a PleurX placement on Friday and wanted to make sure Dr. Sallyanne Kuster was aware.  Informed her I would send to Dr. Sallyanne Kuster as Juluis Rainier.  If any further recommendations will return pt call but recommended to proceed as scheduled w/ Dr. Prescott Gum.

## 2016-03-13 NOTE — Progress Notes (Signed)
PCP is No PCP Per Patient Referring Provider is Juanito Doom, MD  Chief Complaint  Patient presents with  . Pleural Effusion    Surgical eval   Patient examined, CT scan of chest, echocardiogram, personally reviewed and counseled with patient  HPI:70-year-old Caucasian male nonsmoker with recurrent right pleural effusion requiring thoracentesis x2 in the past month. Over 1 L of fluid has been removed. Patient is symptomatic with chronic cough. Cytology has been negative. Cultures have been negative. The fluid is clear yellow. Today in the office I performed a right thoracentesis removed 1 L of clear yellow fluid to help with his symptoms. Etiology of his effusion is probably cardiac-he has history of ischemic cardiomyopathy and was hospitalized almost 2 weeks for heart failure earlier this year. The patient is on chronic Coumadin therapy. He also has lower extremity edema and venous stasis changes consistent with right heart failure.he did not have a left or right heart cath was hospitalized for heart failure.the patient has a pacemaker.  Patient is referred for evaluation and management of his right recurrent pleural effusion. I've recommended placement of a Pleurx catheter. I do not think that a VATS procedure wouldn't provide herbal relief of his symptoms because of the rapid reaccumulation of the fluid and the recent removal of talc from the pharmacy for pleural insufflation because lack of a national supply. Past Medical History  Diagnosis Date  . Atrial fibrillation ([place])   . Lymphoma ([place]) 1998    of colon   . Diverticulosis   . Esophageal stricture   . Hyperlipidemia   . Hypertension   . CVA (cerebral infarction)   . Colon polyps   . Urinary frequency   . Umbilical hernia   . Congenital heart block   . CHB (complete heart block) ([place]) 11/06/2013  . Cardiomyopathy, ischemic 11/07/2013  . Chronic combined systolic and diastolic CHF (congestive heart failure) ([place])   .  Hyperthyroidism 11/16/2015  . Colon polyps     Past Surgical History  Procedure Laterality Date  . Appendectomy  1953  . Colectomy      for lymphoma  . Pacemaker placement      replaced 3 x  . Tonsillectomy    . Neck fusion    . Back surgery    . Carotid doppler  11/04/2012    Proximal Rt ICA 50-99% diameter reduction; Lft Bulb demonstrated mild amount homogeneous plaque-not hemodynamically significant; Lft ICA-normal patency.  . Pacemaker generator change  01/31/2012    [employer] DR RF model [id number] serial [phone]  . Cardiac catheterization  01/06/2003    Recommend medical therapy  . Cardiovascular stress test  07/16/2012    Mild-moderate perfusion defect seen in Basal inferior, Mid inferior, and Apica lateral consistent with infarct/scar. No scintigraphic evidence for inducible myocardial ischemia. No ECG changes. EKG negative for ischemia.  . Transthoracic echocardiogram  11/04/2012    EF 123456, systolic function moderately reduced, mild regurg of the aortic and mitral valves.  . Permanent pacemaker generator change N/A 01/31/2012    Procedure: PERMANENT PACEMAKER GENERATOR CHANGE;  Surgeon: Sanda Klein, MD;  Location: [facility];     Family History  Problem Relation Age of Onset  . Prostate cancer Father   . Colon cancer Neg Hx   . Diabetes Maternal Grandmother   . Heart disease Mother   . Stroke Mother   . Heart attack Neg Hx     Social History Social History  Substance Use Topics  .  Smoking status: Never Smoker   . Smokeless tobacco: Never Used  . Alcohol Use: No    Current Outpatient Prescriptions  Medication Sig Dispense Refill  . furosemide (LASIX) 20 MG tablet TAKE 20 MG 4 TIMES WEEKLY 30 tablet 5  . hydrocortisone cream 1 % Apply 1 application topically 2 (two) times daily as needed for itching. Reported on 12/07/2015    . methimazole (TAPAZOLE) 10 MG tablet Take 60 mg by mouth daily.    . metoprolol succinate (TOPROL-XL) 25 MG 24 hr tablet Take 1  tablet (25 mg total) by mouth daily. 30 tablet 11  . pravastatin (PRAVACHOL) 80 MG tablet TAKE 1 TABLET BY MOUTH EVERY DAY 90 tablet 3  . Tamsulosin HCl (FLOMAX) 0.4 MG CAPS Take 0.4 mg by mouth at bedtime.     . traZODone (DESYREL) 50 MG tablet Take 1 tablet (50 mg total) by mouth at bedtime as needed for sleep. 30 tablet 2  . warfarin (COUMADIN) 2.5 MG tablet Take 1/2 -1 tablet by mouth daily as directed by coumadin clinic 90 tablet 1   No current facility-administered medications for this visit.    Allergies  Allergen Reactions  . Adhesive [Tape] Other (See Comments)    Skin irritation - please use paper tape  . Statins Other (See Comments)    Leg cramps.   Tolerates pravastatin.    Marland Kitchen Penicillins Rash    Has patient had a PCN reaction causing immediate rash, facial/tongue/throat swelling, SOB or lightheadedness with hypotension: Yes Has patient had a PCN reaction causing severe rash involving mucus membranes or skin necrosis: No Has patient had a PCN reaction that required hospitalization No Has patient had a PCN reaction occurring within the last 10 years: No If all of the above answers are "NO", then may proceed with Cephalosporin use.    Review of Systems        Review of Systems :  [ y ] = yes, [  ] = no           General :  Weight gain [ ]     Weight loss  [   ]  Fatigue [  ]  Fever  ]  Chills  []                                 Weakness  []            HEENT    Headache [  ]  Dizziness [  ]  Blurred vision [  ] Glaucoma  [  ]                          Nosebleeds [  ] Painful or loose teeth [  ]        Cardiac :  Chest pain/ pressure [  ]  Resting SOB [  ] exertional SOB [place]  ]                        Orthopnea [  ]  Pedal edema  [place] ]  Palpitations [ yes history of A. fib ] Syncope/presyncope [ ]                         Paroxysmal nocturnal dyspnea [  ]last echo showing EF of 30% February 2017  Pulmonary : cough [ yes severe the past month from his recurrent right  pleural effusion ]  wheezing [  ]  Hemoptysis [  ] Sputum [  ] Snoring [  ]                              Pneumothorax [  ]  Sleep apnea [  ]        GI : Vomiting [  ]  Dysphagia [  ]  Melena  [  ]  Abdominal pain [  ] BRBPR [  ]              Heart burn [  ]  Constipation [  ] Diarrhea  [  ] Colonoscopy [   ]history of esophageal dysmotility        GU : Hematuria [  ]  Dysuria [  ]  Nocturia [  ] UTI's [  ]        Vascular : Claudication [  ]  Rest pain [  ]  DVT [  ] Vein stripping [  ] leg ulcers [  ]                          TIA [  ] Stroke [  ]  Varicose veins [place].Blacker  ]        NEURO :  Headaches  [  ] Seizures [  ] Vision changes [  ] Paresthesias [  ]                                       Seizures [  ]        Musculoskeletal :  Arthritis [  ] Gout  [  ]  Back pain [  ]  Joint pain [  ]        Skin :  Rash [  ]  Melanoma [  ] Sores [  ]        Heme : Bleeding problems [  ]Clotting Disorders [  ] Anemia [  ]Blood Transfusion [ ]                      History of non-Hodgkin's lymphoma of the small bowel treated with chemotherapy and surgery 1998-no evidence recurrence.        Endocrine : Diabetes [  ] Heat or Cold intolerance [  ] Polyuria [  ]excessive thirst [ ] hyperthyroid on meds        Psych : Depression [  ]  Anxiety [  ]  Psych hospitalizations [  ] Memory change [  ]                                               BP 117/78 mmHg  Pulse 67  Resp 20  Ht 5\' 9"  (1.753 m)  Wt 186 lb (84.369 kg)  BMI 27.45 kg/m2  SpO2 98% Physical Exam       Physical Exam  General: well-nourished alert 38-year-old male no acute distress with cough-dry HEENT: Normocephalic pupils equal , dentition adequate Neck: Supple without JVD, adenopathy, or bruit Chest: diminished breath sounds  on the right, no rhonchi, no tenderness             or deformity Cardiovascular: Regular rate and rhythm, no murmur, no gallop, peripheral pulses             palpable in all extremities Abdomen:  Soft,  nontender, no palpable mass or organomegaly Extremities: Warm, well-perfused, no clubbing cyanosis or tenderness,2+ edema of the legs              2+ venous stasis changes of the legs Rectal/GU: Deferred Neuro: Grossly non--focal and symmetrical throughout Skin: Clean and dry without rash or ulceration    Diagnostic Tests: CT scan of chest, 2-D echocardiogram performed earlier this year personally reviewed No evidence of at risk pulmonary masses or abnormal mediastinal adenopathy At least moderate LV dysfunction, evidence of right-sided failure on echo  Impression: Recurrent right pleural effusion probably from right heart failure No evidence of malignancy on cytology of  fluid  Plan:right thoracentesis performed an office today removing 1.0 L. This is for symptomatic relief. Right Pleurx catheter plan at [place] in June 2. Procedure indications benefits and risks discussed with patient and he agrees to proceed with surgery.   Len Childs, MD [facility] [phone]

## 2016-03-13 NOTE — Telephone Encounter (Signed)
Spoke with pt's wife. States that pt saw his Psychologist, sport and exercise today. He states that the pt needs to cancel his thoracentesis. This has already been canceled. Nothing further was needed.

## 2016-03-13 NOTE — Telephone Encounter (Signed)
Understood 

## 2016-03-14 ENCOUNTER — Ambulatory Visit (HOSPITAL_COMMUNITY): Payer: PPO

## 2016-03-14 ENCOUNTER — Encounter (HOSPITAL_COMMUNITY): Payer: Self-pay | Admitting: *Deleted

## 2016-03-14 MED ORDER — VANCOMYCIN HCL IN DEXTROSE 1-5 GM/200ML-% IV SOLN
1000.0000 mg | INTRAVENOUS | Status: AC
  Administered 2016-03-15: 1000 mg via INTRAVENOUS
  Filled 2016-03-14 (×2): qty 200

## 2016-03-14 NOTE — Anesthesia Preprocedure Evaluation (Addendum)
Anesthesia Evaluation  Patient identified by MRN, date of birth, ID band Patient awake    Reviewed: Allergy & Precautions, NPO status , Patient's Chart, lab work & pertinent test results, reviewed documented beta blocker date and time   History of Anesthesia Complications Negative for: history of anesthetic complications  Airway Mallampati: I  TM Distance: >3 FB Neck ROM: Full    Dental  (+) Dental Advisory Given, Caps   Pulmonary  cardiogenic pleural effusion   breath sounds clear to auscultation       Cardiovascular hypertension, Pt. on medications and Pt. on home beta blockers (-) angina+ CAD, + Peripheral Vascular Disease and +CHF  + dysrhythmias Atrial Fibrillation + pacemaker (complete heart block)  Rhythm:Regular Rate:Normal  2/17 ECHO: EF 45-50%, antero-septal akinesis '13 STRESS: inferolat scar, no ischemia, EF 44%   Neuro/Psych TIACVA, No Residual Symptoms    GI/Hepatic Neg liver ROS, GERD (esophageal stricture)  Controlled,  Endo/Other  Hypothyroidism   Renal/GU Renal disease     Musculoskeletal   Abdominal   Peds  Hematology  (+) Blood dyscrasia (lymphoma, coumadin last taken sunday ), ,   Anesthesia Other Findings   Reproductive/Obstetrics                        Anesthesia Physical Anesthesia Plan  ASA: III  Anesthesia Plan: MAC   Post-op Pain Management:    Induction: Intravenous  Airway Management Planned: Natural Airway and Simple Face Mask  Additional Equipment:   Intra-op Plan:   Post-operative Plan:   Informed Consent: I have reviewed the patients History and Physical, chart, labs and discussed the procedure including the risks, benefits and alternatives for the proposed anesthesia with the patient or authorized representative who has indicated his/her understanding and acceptance.   Dental advisory given  Plan Discussed with: CRNA and Surgeon  Anesthesia  Plan Comments: (Plan routine monitors, MAC)        Anesthesia Quick Evaluation

## 2016-03-15 ENCOUNTER — Ambulatory Visit (HOSPITAL_COMMUNITY): Payer: PPO

## 2016-03-15 ENCOUNTER — Ambulatory Visit (HOSPITAL_COMMUNITY)
Admission: RE | Admit: 2016-03-15 | Discharge: 2016-03-15 | Disposition: A | Discharge status: Home / Self Care | Payer: PPO | Source: Ambulatory Visit | Attending: Cardiothoracic Surgery | Admitting: Cardiothoracic Surgery

## 2016-03-15 ENCOUNTER — Ambulatory Visit (HOSPITAL_COMMUNITY): Payer: PPO | Admitting: Anesthesiology

## 2016-03-15 ENCOUNTER — Encounter (HOSPITAL_COMMUNITY)
Admission: RE | Disposition: A | Discharge status: Home / Self Care | Payer: Self-pay | Source: Ambulatory Visit | Attending: Cardiothoracic Surgery

## 2016-03-15 DIAGNOSIS — E039 Hypothyroidism, unspecified: Secondary | ICD-10-CM | POA: Diagnosis not present

## 2016-03-15 DIAGNOSIS — I10 Essential (primary) hypertension: Secondary | ICD-10-CM | POA: Diagnosis not present

## 2016-03-15 DIAGNOSIS — I2510 Atherosclerotic heart disease of native coronary artery without angina pectoris: Secondary | ICD-10-CM | POA: Insufficient documentation

## 2016-03-15 DIAGNOSIS — J90 Pleural effusion, not elsewhere classified: Secondary | ICD-10-CM

## 2016-03-15 DIAGNOSIS — K219 Gastro-esophageal reflux disease without esophagitis: Secondary | ICD-10-CM | POA: Insufficient documentation

## 2016-03-15 DIAGNOSIS — I509 Heart failure, unspecified: Secondary | ICD-10-CM | POA: Insufficient documentation

## 2016-03-15 DIAGNOSIS — I482 Chronic atrial fibrillation: Secondary | ICD-10-CM | POA: Insufficient documentation

## 2016-03-15 DIAGNOSIS — I4891 Unspecified atrial fibrillation: Secondary | ICD-10-CM | POA: Diagnosis not present

## 2016-03-15 DIAGNOSIS — Z01818 Encounter for other preprocedural examination: Secondary | ICD-10-CM | POA: Diagnosis not present

## 2016-03-15 DIAGNOSIS — I5040 Unspecified combined systolic (congestive) and diastolic (congestive) heart failure: Secondary | ICD-10-CM | POA: Diagnosis not present

## 2016-03-15 HISTORY — PX: CHEST TUBE INSERTION: SHX231

## 2016-03-15 HISTORY — DX: Constipation, unspecified: K59.00

## 2016-03-15 HISTORY — DX: Cerebral infarction, unspecified: I63.9

## 2016-03-15 HISTORY — DX: Family history of other specified conditions: Z84.89

## 2016-03-15 HISTORY — DX: Reserved for inherently not codable concepts without codable children: IMO0001

## 2016-03-15 LAB — APTT: aPTT: 33 seconds (ref 24–37)

## 2016-03-15 LAB — CBC
HCT: 48.9 % (ref 39.0–52.0)
Hemoglobin: 16.3 g/dL (ref 13.0–17.0)
MCH: 31.3 pg (ref 26.0–34.0)
MCHC: 33.3 g/dL (ref 30.0–36.0)
MCV: 93.9 fL (ref 78.0–100.0)
Platelets: 336 10*3/uL (ref 150–400)
RBC: 5.21 MIL/uL (ref 4.22–5.81)
RDW: 14.6 % (ref 11.5–15.5)
WBC: 9.9 10*3/uL (ref 4.0–10.5)

## 2016-03-15 LAB — COMPREHENSIVE METABOLIC PANEL
ALT: 16 U/L — ABNORMAL LOW (ref 17–63)
AST: 23 U/L (ref 15–41)
Albumin: 2.5 g/dL — ABNORMAL LOW (ref 3.5–5.0)
Alkaline Phosphatase: 98 U/L (ref 38–126)
Anion gap: 8 (ref 5–15)
BUN: 13 mg/dL (ref 6–20)
CO2: 35 mmol/L — ABNORMAL HIGH (ref 22–32)
Calcium: 8.3 mg/dL — ABNORMAL LOW (ref 8.9–10.3)
Chloride: 91 mmol/L — ABNORMAL LOW (ref 101–111)
Creatinine, Ser: 1.15 mg/dL (ref 0.61–1.24)
GFR calc Af Amer: >60 mL/min (ref >60)
GFR calc non Af Amer: >60 mL/min (ref >60)
Glucose, Bld: 104 mg/dL — ABNORMAL HIGH (ref 65–99)
Potassium: 3 mmol/L — ABNORMAL LOW (ref 3.5–5.1)
Sodium: 134 mmol/L — ABNORMAL LOW (ref 135–145)
Total Bilirubin: 1.1 mg/dL (ref 0.3–1.2)
Total Protein: 6.1 g/dL — ABNORMAL LOW (ref 6.5–8.1)

## 2016-03-15 LAB — PROTIME-INR
INR: 1.54 — ABNORMAL HIGH (ref 0.00–1.49)
Prothrombin Time: 18.5 seconds — ABNORMAL HIGH (ref 11.6–15.2)

## 2016-03-15 SURGERY — INSERTION, PLEURAL DRAINAGE CATHETER
Anesthesia: Monitor Anesthesia Care | Site: Chest | Laterality: Right

## 2016-03-15 MED ORDER — FENTANYL CITRATE (PF) 250 MCG/5ML IJ SOLN
INTRAMUSCULAR | Status: AC
  Filled 2016-03-15: qty 5

## 2016-03-15 MED ORDER — MIDAZOLAM HCL 2 MG/2ML IJ SOLN: 0.5000 mg | Freq: Once | INTRAMUSCULAR | Status: DC | PRN

## 2016-03-15 MED ORDER — SODIUM CHLORIDE 0.9 % IV SOLN: 250.0000 mL | INTRAVENOUS | Status: DC | PRN

## 2016-03-15 MED ORDER — OXYCODONE HCL 5 MG PO TABS: 5.0000 mg | ORAL_TABLET | ORAL | Status: DC | PRN

## 2016-03-15 MED ORDER — MIDAZOLAM HCL 2 MG/2ML IJ SOLN
INTRAMUSCULAR | Status: AC
  Filled 2016-03-15: qty 2

## 2016-03-15 MED ORDER — FENTANYL CITRATE (PF) 100 MCG/2ML IJ SOLN: 25.0000 ug | INTRAMUSCULAR | Status: DC | PRN

## 2016-03-15 MED ORDER — LIDOCAINE HCL (PF) 1 % IJ SOLN
INTRAMUSCULAR | Status: DC | PRN
  Administered 2016-03-15: 12 mL

## 2016-03-15 MED ORDER — DEXTROSE 5 % IV SOLN
10.0000 mg | INTRAVENOUS | Status: DC | PRN
  Administered 2016-03-15: 25 ug/min via INTRAVENOUS

## 2016-03-15 MED ORDER — MIDAZOLAM HCL 5 MG/5ML IJ SOLN
INTRAMUSCULAR | Status: DC | PRN
  Administered 2016-03-15: 2 mg via INTRAVENOUS

## 2016-03-15 MED ORDER — 0.9 % SODIUM CHLORIDE (POUR BTL) OPTIME
TOPICAL | Status: DC | PRN
  Administered 2016-03-15: 1000 mL

## 2016-03-15 MED ORDER — MEPERIDINE HCL 25 MG/ML IJ SOLN: 6.2500 mg | INTRAMUSCULAR | Status: DC | PRN

## 2016-03-15 MED ORDER — FENTANYL CITRATE (PF) 100 MCG/2ML IJ SOLN
INTRAMUSCULAR | Status: DC | PRN
  Administered 2016-03-15 (×2): 25 ug via INTRAVENOUS

## 2016-03-15 MED ORDER — ACETAMINOPHEN 325 MG PO TABS: 650.0000 mg | ORAL_TABLET | ORAL | Status: DC | PRN

## 2016-03-15 MED ORDER — KETOROLAC TROMETHAMINE 15 MG/ML IJ SOLN: 15.0000 mg | Freq: Four times a day (QID) | INTRAMUSCULAR | Status: DC

## 2016-03-15 MED ORDER — ROCURONIUM BROMIDE 50 MG/5ML IV SOLN
INTRAVENOUS | Status: AC
  Filled 2016-03-15: qty 1

## 2016-03-15 MED ORDER — PROMETHAZINE HCL 25 MG/ML IJ SOLN: 6.2500 mg | INTRAMUSCULAR | Status: DC | PRN

## 2016-03-15 MED ORDER — SODIUM CHLORIDE 0.9% FLUSH: 3.0000 mL | Freq: Two times a day (BID) | INTRAVENOUS | Status: DC

## 2016-03-15 MED ORDER — LIDOCAINE HCL (CARDIAC) 20 MG/ML IV SOLN
INTRAVENOUS | Status: DC | PRN
  Administered 2016-03-15: 30 mg via INTRATRACHEAL

## 2016-03-15 MED ORDER — LACTATED RINGERS IV SOLN
INTRAVENOUS | Status: DC | PRN
  Administered 2016-03-15: 07:00:00 via INTRAVENOUS

## 2016-03-15 MED ORDER — ACETAMINOPHEN 500 MG PO TABS: 1000.0000 mg | ORAL_TABLET | Freq: Four times a day (QID) | ORAL | Status: DC

## 2016-03-15 MED ORDER — PHENYLEPHRINE 40 MCG/ML (10ML) SYRINGE FOR IV PUSH (FOR BLOOD PRESSURE SUPPORT)
PREFILLED_SYRINGE | INTRAVENOUS | Status: AC
  Filled 2016-03-15: qty 10

## 2016-03-15 MED ORDER — SODIUM CHLORIDE 0.9% FLUSH: 3.0000 mL | INTRAVENOUS | Status: DC | PRN

## 2016-03-15 MED ORDER — PROPOFOL 500 MG/50ML IV EMUL
INTRAVENOUS | Status: DC | PRN
  Administered 2016-03-15: 75 ug/kg/min via INTRAVENOUS

## 2016-03-15 MED ORDER — PROPOFOL 10 MG/ML IV BOLUS
INTRAVENOUS | Status: AC
  Filled 2016-03-15: qty 20

## 2016-03-15 MED ORDER — ACETAMINOPHEN 650 MG RE SUPP: 650.0000 mg | RECTAL | Status: DC | PRN

## 2016-03-15 SURGICAL SUPPLY — 29 items
CANISTER SUCTION 2500CC (MISCELLANEOUS) ×3 IMPLANT
COVER SURGICAL LIGHT HANDLE (MISCELLANEOUS) ×3 IMPLANT
DERMABOND ADVANCED (GAUZE/BANDAGES/DRESSINGS) ×2
DERMABOND ADVANCED .7 DNX12 (GAUZE/BANDAGES/DRESSINGS) ×1 IMPLANT
DRAPE C-ARM 42X72 X-RAY (DRAPES) ×3 IMPLANT
DRAPE LAPAROSCOPIC ABDOMINAL (DRAPES) ×3 IMPLANT
DRSG TEGADERM 4X4.75 (GAUZE/BANDAGES/DRESSINGS) ×3 IMPLANT
GLOVE BIO SURGEON STRL SZ7.5 (GLOVE) ×6 IMPLANT
GLOVE BIOGEL PI IND STRL 6.5 (GLOVE) ×2 IMPLANT
GLOVE BIOGEL PI INDICATOR 6.5 (GLOVE) ×4
GOWN STRL REUS W/ TWL LRG LVL3 (GOWN DISPOSABLE) ×2 IMPLANT
GOWN STRL REUS W/TWL LRG LVL3 (GOWN DISPOSABLE) ×4
KIT BASIN OR (CUSTOM PROCEDURE TRAY) ×3 IMPLANT
KIT PLEURX DRAIN CATH 1000ML (MISCELLANEOUS) ×9 IMPLANT
KIT PLEURX DRAIN CATH 15.5FR (DRAIN) ×6 IMPLANT
KIT ROOM TURNOVER OR (KITS) ×3 IMPLANT
NEEDLE HYPO 25GX1X1/2 BEV (NEEDLE) ×3 IMPLANT
NS IRRIG 1000ML POUR BTL (IV SOLUTION) ×3 IMPLANT
PACK GENERAL/GYN (CUSTOM PROCEDURE TRAY) ×3 IMPLANT
PAD ARMBOARD 7.5X6 YLW CONV (MISCELLANEOUS) ×6 IMPLANT
SET DRAINAGE LINE (MISCELLANEOUS) IMPLANT
SUT ETHILON 3 0 PS 1 (SUTURE) ×3 IMPLANT
SUT SILK 2 0 SH (SUTURE) ×3 IMPLANT
SUT VIC AB 3-0 SH 8-18 (SUTURE) ×3 IMPLANT
SYR CONTROL 10ML LL (SYRINGE) ×3 IMPLANT
TOWEL OR 17X24 6PK STRL BLUE (TOWEL DISPOSABLE) ×3 IMPLANT
TOWEL OR 17X26 10 PK STRL BLUE (TOWEL DISPOSABLE) ×3 IMPLANT
VALVE REPLACEMENT CAP (MISCELLANEOUS) IMPLANT
WATER STERILE IRR 1000ML POUR (IV SOLUTION) ×3 IMPLANT

## 2016-03-15 NOTE — Discharge Instructions (Signed)
No driving for 24 hours  No COUMADIN UNTIL June 3   Home Health Nurse will come to drain Monday, Wednesday, Friday  Office will call you for an appointment in about 2 weeks

## 2016-03-15 NOTE — Transfer of Care (Signed)
Immediate Anesthesia Transfer of Care Note  Patient: Benjamin Mckay [facility]  Procedure(s) Performed: Procedure(s): INSERTION PLEURAL DRAINAGE CATHETER (Right)  Patient Location: PACU  Anesthesia Type:MAC  Level of Consciousness: awake, alert  and sedated  Airway & Oxygen Therapy: Patient connected to nasal cannula oxygen  Post-op Assessment: Post -op Vital signs reviewed and stable  Post vital signs: stable  Last Vitals:  Filed Vitals:   03/15/16 0703  BP: 128/77  Pulse: 68  Temp: 36.6 C  Resp: 20    Last Pain: There were no vitals filed for this visit.       Complications: No apparent anesthesia complications

## 2016-03-15 NOTE — Progress Notes (Signed)
Report given to robin roberts rn as Arts administrator

## 2016-03-15 NOTE — Progress Notes (Signed)
The patient was examined and preop studies reviewed. There has been no change from the prior exam and the patient is ready for surgery.  plan right pleurx catheter on D Strohmeier

## 2016-03-15 NOTE — Brief Op Note (Signed)
03/15/2016  8:39 AM  PATIENT:  Estella Husk Loh  70 y.o. male  PRE-OPERATIVE DIAGNOSIS:  RIGHT PLEURAL EFFUSION  POST-OPERATIVE DIAGNOSIS:  RIGHT PLEURAL EFFUSION  PROCEDURE:  Procedure(s): INSERTION PLEURAL DRAINAGE CATHETER (Right)  Drainage 2.2 L of pleural fluid  SURGEON:  Surgeon(s) and Role:    * Ivin Poot, MD - Primary  PHYSICIAN ASSISTANT: none  ASSISTANTS: none   ANESTHESIA:   local and IV sedation  EBL:  Total I/O In: -  Out: 2250 [Other:2200; Blood:50]  BLOOD ADMINISTERED:none  DRAINS: R Pleurx  LOCAL MEDICATIONS USED:  LIDOCAINE  and Amount: 10 ml  SPECIMEN:  No Specimen  DISPOSITION OF SPECIMEN:  N/A  COUNTS:  YES  TOURNIQUET:  * No tourniquets in log *  DICTATION: .Dragon Dictation  PLAN OF CARE: Discharge to home after PACU  PATIENT DISPOSITION:  PACU - hemodynamically stable.   Delay start of Pharmacological VTE agent (>24hrs) due to surgical blood loss or risk of bleeding: yes

## 2016-03-15 NOTE — Anesthesia Postprocedure Evaluation (Signed)
Anesthesia Post Note  Patient: Omarii Meade [facility]  Procedure(s) Performed: Procedure(s) (LRB): INSERTION PLEURAL DRAINAGE CATHETER (Right)  Patient location during evaluation: PACU Anesthesia Type: MAC Level of consciousness: awake and alert, oriented and patient cooperative Pain management: pain level controlled Vital Signs Assessment: post-procedure vital signs reviewed and stable Respiratory status: spontaneous breathing, nonlabored ventilation, respiratory function stable and patient connected to nasal cannula oxygen Cardiovascular status: blood pressure returned to baseline and stable Postop Assessment: no signs of nausea or vomiting Anesthetic complications: no    Last Vitals:  Filed Vitals:   03/15/16 1030 03/15/16 1111  BP: 98/58 125/66  Pulse: 67 65  Temp: 36.7 C   Resp: 15 12    Last Pain: There were no vitals filed for this visit.               Midge Minium

## 2016-03-15 NOTE — Anesthesia Procedure Notes (Signed)
Procedure Name: MAC Date/Time: 03/15/2016 8:01 AM Performed by: Lavell Luster Pre-anesthesia Checklist: Patient identified, Emergency Drugs available, Suction available, Patient being monitored and Timeout performed Oxygen Delivery Method: Nasal cannula Preoxygenation: Pre-oxygenation with 100% oxygen Intubation Type: IV induction Placement Confirmation: positive ETCO2 and breath sounds checked- equal and bilateral Dental Injury: Teeth and Oropharynx as per pre-operative assessment

## 2016-03-16 NOTE — Op Note (Signed)
Benjamin Mckay, VERBA NO.:  [id number]  MEDICAL RECORD NO.:  [id number]  LOCATION:  [facility]                         FACILITY:  [facility]  PHYSICIAN:  Ivin Poot, M.D.  DATE OF BIRTH:  01/28/1946  DATE OF PROCEDURE:  03/15/2016 DATE OF DISCHARGE:  03/15/2016                              OPERATIVE REPORT   OPERATION:  Placement of right PleurX catheter.  SURGEON:  Ivin Poot, M.D.  ANESTHESIA:  Local 1% lidocaine with IV conscious monitored sedation.  PREOPERATIVE DIAGNOSIS:  Recurrent right pleural effusion from chronic right heart failure, chronic atrial fibrillation.  POSTOPERATIVE DIAGNOSIS:  Recurrent right pleural effusion from chronic right heart failure, chronic atrial fibrillation.  PROCEDURE:  The patient was brought from the preparation area to the operating room, where he was placed supine on the OR table.  IV conscious sedation was administered and monitored by the Anesthesia team.  The right chest was prepped and draped as a sterile field.  A proper time-out was performed.  1% lidocaine was infiltrated in 2 areas at the anterior axillary line in the fifth interspace and at the midclavicular line at the right costal margin.  Two small incisions were made in these areas.  A guidewire was passed into the right pleural space using the Seldinger technique and confirmed by C-arm fluoroscopy.  Next, a PleurX catheter was tunneled from the lower incision up to the upper incision and cut to the appropriate length.  Next, over the guidewire a dilator sheath system was inserted into the pleural space and the wire was removed.  Next, the PleurX catheter was placed through the tear-away sheath into the pleural space.  Its position was confirmed by C-arm fluoroscopy.  It was connected to vacuum suction bottles and 2.2 L of fluid was removed from the right pleural space.  The upper incision was closed in layers using interrupted Vicryl  for subcutaneous layer and interrupted nylon for the skin.  The lower incision was closed and secured the catheter with a 0 silk suture.  The catheter was capped and dressings were applied and a sterile sheet was placed over the entire system.  The patient returned to the recovery room in stable condition for chest x-ray to be done.     Ivin Poot, M.D.     PV/MEDQ  D:  03/15/2016  T:  03/16/2016  Job:  [id number]

## 2016-03-18 ENCOUNTER — Encounter (HOSPITAL_COMMUNITY): Payer: Self-pay | Admitting: Cardiothoracic Surgery

## 2016-03-18 DIAGNOSIS — Z7901 Long term (current) use of anticoagulants: Secondary | ICD-10-CM | POA: Diagnosis not present

## 2016-03-18 DIAGNOSIS — I110 Hypertensive heart disease with heart failure: Secondary | ICD-10-CM | POA: Diagnosis not present

## 2016-03-18 DIAGNOSIS — I4891 Unspecified atrial fibrillation: Secondary | ICD-10-CM | POA: Diagnosis not present

## 2016-03-18 DIAGNOSIS — I5042 Chronic combined systolic (congestive) and diastolic (congestive) heart failure: Secondary | ICD-10-CM | POA: Diagnosis not present

## 2016-03-18 DIAGNOSIS — E785 Hyperlipidemia, unspecified: Secondary | ICD-10-CM | POA: Diagnosis not present

## 2016-03-18 DIAGNOSIS — E039 Hypothyroidism, unspecified: Secondary | ICD-10-CM | POA: Diagnosis not present

## 2016-03-18 DIAGNOSIS — Z4682 Encounter for fitting and adjustment of non-vascular catheter: Secondary | ICD-10-CM | POA: Diagnosis not present

## 2016-03-18 DIAGNOSIS — J90 Pleural effusion, not elsewhere classified: Secondary | ICD-10-CM | POA: Diagnosis not present

## 2016-03-19 DIAGNOSIS — H2512 Age-related nuclear cataract, left eye: Secondary | ICD-10-CM | POA: Diagnosis not present

## 2016-03-19 LAB — CUP PACEART INCLINIC DEVICE CHECK
Implantable Lead Implant Date: 20130419
MDC IDC LEAD LOCATION: 753860
MDC IDC PG SERIAL: 7313697
MDC IDC SESS DTM: 20170606130348

## 2016-03-20 ENCOUNTER — Encounter: Payer: PPO | Admitting: Cardiothoracic Surgery

## 2016-03-20 DIAGNOSIS — I4891 Unspecified atrial fibrillation: Secondary | ICD-10-CM | POA: Diagnosis not present

## 2016-03-20 DIAGNOSIS — Z4682 Encounter for fitting and adjustment of non-vascular catheter: Secondary | ICD-10-CM | POA: Diagnosis not present

## 2016-03-20 DIAGNOSIS — E785 Hyperlipidemia, unspecified: Secondary | ICD-10-CM | POA: Diagnosis not present

## 2016-03-20 DIAGNOSIS — E039 Hypothyroidism, unspecified: Secondary | ICD-10-CM | POA: Diagnosis not present

## 2016-03-20 DIAGNOSIS — J90 Pleural effusion, not elsewhere classified: Secondary | ICD-10-CM | POA: Diagnosis not present

## 2016-03-20 DIAGNOSIS — Z7901 Long term (current) use of anticoagulants: Secondary | ICD-10-CM | POA: Diagnosis not present

## 2016-03-20 DIAGNOSIS — I5042 Chronic combined systolic (congestive) and diastolic (congestive) heart failure: Secondary | ICD-10-CM | POA: Diagnosis not present

## 2016-03-20 DIAGNOSIS — I110 Hypertensive heart disease with heart failure: Secondary | ICD-10-CM | POA: Diagnosis not present

## 2016-03-21 DIAGNOSIS — H2512 Age-related nuclear cataract, left eye: Secondary | ICD-10-CM | POA: Diagnosis not present

## 2016-03-21 DIAGNOSIS — H5703 Miosis: Secondary | ICD-10-CM | POA: Diagnosis not present

## 2016-03-22 DIAGNOSIS — E039 Hypothyroidism, unspecified: Secondary | ICD-10-CM | POA: Diagnosis not present

## 2016-03-22 DIAGNOSIS — I110 Hypertensive heart disease with heart failure: Secondary | ICD-10-CM | POA: Diagnosis not present

## 2016-03-22 DIAGNOSIS — Z4682 Encounter for fitting and adjustment of non-vascular catheter: Secondary | ICD-10-CM | POA: Diagnosis not present

## 2016-03-22 DIAGNOSIS — J90 Pleural effusion, not elsewhere classified: Secondary | ICD-10-CM | POA: Diagnosis not present

## 2016-03-22 DIAGNOSIS — E785 Hyperlipidemia, unspecified: Secondary | ICD-10-CM | POA: Diagnosis not present

## 2016-03-22 DIAGNOSIS — Z7901 Long term (current) use of anticoagulants: Secondary | ICD-10-CM | POA: Diagnosis not present

## 2016-03-22 DIAGNOSIS — I4891 Unspecified atrial fibrillation: Secondary | ICD-10-CM | POA: Diagnosis not present

## 2016-03-22 DIAGNOSIS — I5042 Chronic combined systolic (congestive) and diastolic (congestive) heart failure: Secondary | ICD-10-CM | POA: Diagnosis not present

## 2016-03-24 DIAGNOSIS — I4891 Unspecified atrial fibrillation: Secondary | ICD-10-CM | POA: Diagnosis not present

## 2016-03-24 DIAGNOSIS — I5042 Chronic combined systolic (congestive) and diastolic (congestive) heart failure: Secondary | ICD-10-CM | POA: Diagnosis not present

## 2016-03-24 DIAGNOSIS — Z4682 Encounter for fitting and adjustment of non-vascular catheter: Secondary | ICD-10-CM | POA: Diagnosis not present

## 2016-03-24 DIAGNOSIS — J90 Pleural effusion, not elsewhere classified: Secondary | ICD-10-CM | POA: Diagnosis not present

## 2016-03-24 DIAGNOSIS — E785 Hyperlipidemia, unspecified: Secondary | ICD-10-CM | POA: Diagnosis not present

## 2016-03-24 DIAGNOSIS — Z7901 Long term (current) use of anticoagulants: Secondary | ICD-10-CM | POA: Diagnosis not present

## 2016-03-24 DIAGNOSIS — E039 Hypothyroidism, unspecified: Secondary | ICD-10-CM | POA: Diagnosis not present

## 2016-03-24 DIAGNOSIS — I110 Hypertensive heart disease with heart failure: Secondary | ICD-10-CM | POA: Diagnosis not present

## 2016-03-25 ENCOUNTER — Encounter: Payer: Self-pay | Admitting: Cardiovascular Disease

## 2016-03-26 ENCOUNTER — Other Ambulatory Visit: Payer: Self-pay | Admitting: Cardiothoracic Surgery

## 2016-03-26 DIAGNOSIS — E785 Hyperlipidemia, unspecified: Secondary | ICD-10-CM | POA: Diagnosis not present

## 2016-03-26 DIAGNOSIS — J90 Pleural effusion, not elsewhere classified: Secondary | ICD-10-CM

## 2016-03-26 DIAGNOSIS — I5042 Chronic combined systolic (congestive) and diastolic (congestive) heart failure: Secondary | ICD-10-CM | POA: Diagnosis not present

## 2016-03-26 DIAGNOSIS — I4891 Unspecified atrial fibrillation: Secondary | ICD-10-CM | POA: Diagnosis not present

## 2016-03-26 DIAGNOSIS — Z4682 Encounter for fitting and adjustment of non-vascular catheter: Secondary | ICD-10-CM | POA: Diagnosis not present

## 2016-03-26 DIAGNOSIS — Z7901 Long term (current) use of anticoagulants: Secondary | ICD-10-CM | POA: Diagnosis not present

## 2016-03-26 DIAGNOSIS — E039 Hypothyroidism, unspecified: Secondary | ICD-10-CM | POA: Diagnosis not present

## 2016-03-26 DIAGNOSIS — I110 Hypertensive heart disease with heart failure: Secondary | ICD-10-CM | POA: Diagnosis not present

## 2016-03-27 ENCOUNTER — Ambulatory Visit (INDEPENDENT_AMBULATORY_CARE_PROVIDER_SITE_OTHER): Payer: PPO | Admitting: Cardiothoracic Surgery

## 2016-03-27 ENCOUNTER — Ambulatory Visit
Admission: RE | Admit: 2016-03-27 | Discharge: 2016-03-27 | Disposition: A | Discharge status: Home / Self Care | Payer: PPO | Source: Ambulatory Visit | Attending: Cardiothoracic Surgery | Admitting: Cardiothoracic Surgery

## 2016-03-27 ENCOUNTER — Ambulatory Visit: Payer: PPO | Admitting: Cardiothoracic Surgery

## 2016-03-27 ENCOUNTER — Other Ambulatory Visit: Payer: Self-pay | Admitting: *Deleted

## 2016-03-27 ENCOUNTER — Encounter: Payer: Self-pay | Admitting: Cardiothoracic Surgery

## 2016-03-27 ENCOUNTER — Ambulatory Visit (INDEPENDENT_AMBULATORY_CARE_PROVIDER_SITE_OTHER): Payer: PPO | Admitting: Pharmacist

## 2016-03-27 VITALS — BP 107/71 | HR 73 | Resp 16 | Ht 69.0 in | Wt 183.0 lb

## 2016-03-27 DIAGNOSIS — I482 Chronic atrial fibrillation, unspecified: Secondary | ICD-10-CM

## 2016-03-27 DIAGNOSIS — Z09 Encounter for follow-up examination after completed treatment for conditions other than malignant neoplasm: Secondary | ICD-10-CM | POA: Diagnosis not present

## 2016-03-27 DIAGNOSIS — I255 Ischemic cardiomyopathy: Secondary | ICD-10-CM

## 2016-03-27 DIAGNOSIS — J90 Pleural effusion, not elsewhere classified: Secondary | ICD-10-CM

## 2016-03-27 DIAGNOSIS — Z7901 Long term (current) use of anticoagulants: Secondary | ICD-10-CM | POA: Diagnosis not present

## 2016-03-27 DIAGNOSIS — J948 Other specified pleural conditions: Secondary | ICD-10-CM

## 2016-03-27 DIAGNOSIS — I4891 Unspecified atrial fibrillation: Secondary | ICD-10-CM

## 2016-03-27 DIAGNOSIS — I4821 Permanent atrial fibrillation: Secondary | ICD-10-CM

## 2016-03-27 LAB — POCT INR: INR: 2.4

## 2016-03-27 NOTE — H&P (Signed)
PCP is No PCP Per Patient Referring Provider is Juanito Doom, MD  Chief Complaint  Patient presents with  . Routine Post Op    s/p R pleurX placement 03/15/16.Marland KitchenDRAINING QOD....APPROX 1 L EACH TIME    [id number] returns for followup 2 weeks after right Pleurx placement for recurrent effusion related to his chronic heart failure. At the time of catheter placement 2 L of fluid was removed The home health nurse draining the catheter 3 days a week drains one liter each episode The patient is breathing is improved but not normal. He still has a dry cough. Followup chest x-ray shows persistent probable loculated effusion which is not in contact with the Pleurx catheter. In order to fully evacuate the effusion because of its loculated nature he will need a VATS. I discussed the procedure in detail the patient and his wife including the risks of the procedure with his history of chronic heart disease and ischemic cardiomyopathy. Echocardiogram performed earlier this year shows EF 45 at 50% with evidence of elevated right-sided pressures.trivial TR. The patient is still highly functional, travels frequently and I feel he should tolerate a VATS under general anesthesia in order to optimize his functional capacity by completely draining the effusion. We'll also obtain pleural biopsy, perform a pleurodesis with doxycycline, and we the Pleurx catheter in place as a backup for potential recurrent effusion. The patient will stop his Coumadin for 5 days prior to surgery.  The patient understands the risks of the procedure including risk of ventilator dependence after surgery, bleeding, recurrent effusion, worsening of his heart failure, and death. He understands that stopping the Coumadin for 5 days could expose her to a slight increase risk for stroke from his atrial fibrillation. The benefits of a VATS and complete evacuation of the effusion to allow increased lung capacity is significant and justifies   these risks.  Past Medical History  Diagnosis Date  . Atrial fibrillation ([place])   . Lymphoma ([place]) 1998    of colon   . Diverticulosis   . Esophageal stricture   . Hyperlipidemia   . Hypertension   . CVA (cerebral infarction)   . Colon polyps   . Urinary frequency   . Umbilical hernia   . Congenital heart block   . CHB (complete heart block) ([place]) 11/06/2013  . Cardiomyopathy, ischemic 11/07/2013  . Chronic combined systolic and diastolic CHF (congestive heart failure) ([place])   . Hyperthyroidism 11/16/2015  . Colon polyps   . Family history of adverse reaction to anesthesia   . Stroke [facility])     [id number] TIA  . Shortness of breath dyspnea     Pulmonary Effusion  . Constipation     Past Surgical History  Procedure Laterality Date  . Appendectomy  1953  . Colectomy      for lymphoma  . Pacemaker placement      replaced 3 x  . Tonsillectomy    . Neck fusion    . Carotid doppler  11/04/2012    Proximal Rt ICA 50-99% diameter reduction; Lft Bulb demonstrated mild amount homogeneous plaque-not hemodynamically significant; Lft ICA-normal patency.  . Pacemaker generator change  01/31/2012    St Jude Med [employer] DR RF model [id number] serial [phone]  . Cardiac catheterization  01/06/2003    Recommend medical therapy  . Cardiovascular stress test  07/16/2012    Mild-moderate perfusion defect seen in Basal inferior, Mid inferior, and Apica lateral consistent with infarct/scar. No scintigraphic evidence for inducible myocardial ischemia. No  ECG changes. EKG negative for ischemia.  . Transthoracic echocardiogram  11/04/2012    EF [id number], systolic function moderately reduced, mild regurg of the aortic and mitral valves.  . Permanent pacemaker generator change N/A 01/31/2012    Procedure: PERMANENT PACEMAKER GENERATOR CHANGE;  Surgeon: Sanda Klein, MD;  Location: [facility] CATH LAB;   . Colonoscopy    . Chest tube insertion Right 03/15/2016    Procedure: INSERTION PLEURAL DRAINAGE CATHETER;  Surgeon:  Ivin Poot, MD;  Location: [facility] OR;  Service: Thoracic;  Laterality: Right;    Family History  Problem Relation Age of Onset  . Prostate cancer Father   . Colon cancer Neg Hx   . Diabetes Maternal Grandmother   . Heart disease Mother   . Stroke Mother   . Heart attack Neg Hx     Social History Social History  Substance Use Topics  . Smoking status: Never Smoker   . Smokeless tobacco: Never Used  . Alcohol Use: No    Current Outpatient Prescriptions  Medication Sig Dispense Refill  . DUREZOL 0.05 % EMUL Apply 1 drop to eye 2 (two) times daily.  1  . furosemide (LASIX) 20 MG tablet TAKE 20 MG 4 TIMES WEEKLY (Patient taking differently: Take 20 mg by mouth daily. ) 30 tablet 5  . hydrocortisone cream 1 % Apply 1 application topically 2 (two) times daily as needed for itching.     . methimazole (TAPAZOLE) 10 MG tablet Take 60 mg by mouth daily.    . metoprolol succinate (TOPROL-XL) 25 MG 24 hr tablet Take 1 tablet (25 mg total) by mouth daily. 30 tablet 11  . pravastatin (PRAVACHOL) 80 MG tablet TAKE 1 TABLET BY MOUTH EVERY DAY (Patient taking differently: TAKE 1 TABLET BY MOUTH EVERY DAY AT 8PM.) 90 tablet 3  . Tamsulosin HCl (FLOMAX) 0.4 MG CAPS Take 0.4 mg by mouth at bedtime.     . traZODone (DESYREL) 50 MG tablet Take 1 tablet (50 mg total) by mouth at bedtime as needed for sleep. 30 tablet 2  . warfarin (COUMADIN) 2.5 MG tablet Take 1/2 -1 tablet by mouth daily as directed by coumadin clinic (Patient taking differently: Take 1.25-2.5 mg by mouth daily at 6 PM. Take half a tablet (1.25mg ) on Tuesday, Thursday, Sunday and one tablet (2.5mg ) the rest of the week.) 90 tablet 1  . acetaminophen (TYLENOL) 500 MG tablet Take 500 mg by mouth every 6 (six) hours as needed (For pain.).    Marland Kitchen chlorpheniramine-HYDROcodone (TUSSIONEX) 10-8 MG/5ML SUER Take 5 mLs by mouth at bedtime as needed for cough.   0  . traMADol (ULTRAM) 50 MG tablet Take 50 mg by mouth 2 (two) times daily as needed  (For pain.).   1   No current facility-administered medications for this visit.    Allergies  Allergen Reactions  . Statins Other (See Comments)    Leg cramps.   Tolerates pravastatin.    . Latex Other (See Comments)    Skin irritation  . Adhesive [Tape] Other (See Comments)    Skin irritation - please use paper tape  . Penicillins Rash and Other (See Comments)    Has patient had a PCN reaction causing immediate rash, facial/tongue/throat swelling, SOB or lightheadedness with hypotension: Yes Has patient had a PCN reaction causing severe rash involving mucus membranes or skin necrosis: No Has patient had a PCN reaction that required hospitalization No Has patient had a PCN reaction occurring within the last 10 years: No  If all of the above answers are "NO", then may proceed with Cephalosporin use.    Review of Systems         Review of Systems :  [ y ] = yes, [  ] = no        General :  Weight gain [   ]    Weight loss  [   ]  Fatigue [  ]  Fever [  ]  Chills  [  ]                                Weakness  [  ]           HEENT    Headache [  ]  Dizziness [  ]  Blurred vision [  ] Glaucoma  [  ]                          Nosebleeds [  ] Painful or loose teeth [  ]        Cardiac :  Chest pain/ pressure [  ]  Resting SOB [  ] exertional SOB [place]  ]                        Orthopnea [  ]  Pedal edema  [  ]  Palpitations [  ] Syncope/presyncope [ ]                         Paroxysmal nocturnal dyspnea [  ]         Pulmonary : cough [ yes ]  wheezing [  ]  Hemoptysis [  ] Sputum [  ] Snoring [  ]                              Pneumothorax [  ]  Sleep apnea [  ]        GI : Vomiting [  ]  Dysphagia [  ]  Melena  [  ]  Abdominal pain [  ] BRBPR [  ]              Heart burn [  ]  Constipation [  ] Diarrhea  [  ] Colonoscopy [   ]        GU : Hematuria [  ]  Dysuria [  ]  Nocturia [  ] UTI's [  ]        Vascular : Claudication [  ]  Rest pain [  ]  DVT [  ] Vein stripping [  ] leg ulcers  [  ]                          TIA [  ] Stroke [  ]  Varicose veins [  ]        NEURO :  Headaches  [  ] Seizures [  ] Vision changes [  ] Paresthesias [  ]                                       Seizures [  ]  Musculoskeletal :  Arthritis [  ] Gout  [  ]  Back pain [  ]  Joint pain [  ]        Skin :  Rash [  ]  Melanoma [  ] Sores [  ]        Heme : Bleeding problems [  ]Clotting Disorders [ yes on Coumadin ] Anemia [  ]Blood Transfusion [ ]         Endocrine : Diabetes [  ] Heat or Cold intolerance [  ] Polyuria [  ]excessive thirst [ ]         Psych : Depression [  ]  Anxiety [  ]  Psych hospitalizations [  ] Memory change [  ]                                               BP 107/71 mmHg  Pulse 73  Resp 16  Ht 5\' 9"  (1.753 m)  Wt 183 lb (83.008 kg)  BMI 27.01 kg/m2  SpO2 95% Physical Exam       Physical Exam  General: well-developed comfortable and appropriate middle-aged Caucasian male with evidence of chronic heart failure, venous stasis and edema of his lower remedy's with support stockings in place HEENT: Normocephalic pupils equal , dentition adequate Neck: Supple without JVD, adenopathy, or bruit Chest: Clear to auscultation, symmetrical breath sounds, no rhonchi, no tenderness             or deformity Cardiovascular: Regular rate and rhythm, no murmur, no gallop, peripheral pulses             palpable in all extremities Abdomen:  Soft, nontender, no palpable mass or organomegaly Extremities: Warm, well-perfused, no clubbing cyanosis edema or tenderness,              no venous stasis changes of the legs Rectal/GU: Deferred Neuro: Grossly non--focal and symmetrical throughout Skin: Clean and dry without rash or ulceration   Diagnostic Tests: Chest x-ray today shows persistent significant right pleural effusion.  Impression: The Pleurx catheter has improved his symptoms but there is still a significant probable loculated right effusion which will need  VATS for drainage at which time we'll also do a pleurodesis with doxycycline.  Plan:right VATS for drainage of effusion and pleurodesis on June 20 at [facility] hospital. Patient will stop his Coumadin now.   Len Childs, MD [facility] [phone]

## 2016-03-28 DIAGNOSIS — E039 Hypothyroidism, unspecified: Secondary | ICD-10-CM | POA: Diagnosis not present

## 2016-03-28 DIAGNOSIS — I4891 Unspecified atrial fibrillation: Secondary | ICD-10-CM | POA: Diagnosis not present

## 2016-03-28 DIAGNOSIS — Z4682 Encounter for fitting and adjustment of non-vascular catheter: Secondary | ICD-10-CM | POA: Diagnosis not present

## 2016-03-28 DIAGNOSIS — Z7901 Long term (current) use of anticoagulants: Secondary | ICD-10-CM | POA: Diagnosis not present

## 2016-03-28 DIAGNOSIS — J90 Pleural effusion, not elsewhere classified: Secondary | ICD-10-CM | POA: Diagnosis not present

## 2016-03-28 DIAGNOSIS — I5042 Chronic combined systolic (congestive) and diastolic (congestive) heart failure: Secondary | ICD-10-CM | POA: Diagnosis not present

## 2016-03-28 DIAGNOSIS — E785 Hyperlipidemia, unspecified: Secondary | ICD-10-CM | POA: Diagnosis not present

## 2016-03-28 DIAGNOSIS — I110 Hypertensive heart disease with heart failure: Secondary | ICD-10-CM | POA: Diagnosis not present

## 2016-03-30 DIAGNOSIS — J90 Pleural effusion, not elsewhere classified: Secondary | ICD-10-CM | POA: Diagnosis not present

## 2016-03-30 DIAGNOSIS — I110 Hypertensive heart disease with heart failure: Secondary | ICD-10-CM | POA: Diagnosis not present

## 2016-03-30 DIAGNOSIS — I5042 Chronic combined systolic (congestive) and diastolic (congestive) heart failure: Secondary | ICD-10-CM | POA: Diagnosis not present

## 2016-03-30 DIAGNOSIS — E039 Hypothyroidism, unspecified: Secondary | ICD-10-CM | POA: Diagnosis not present

## 2016-03-30 DIAGNOSIS — E785 Hyperlipidemia, unspecified: Secondary | ICD-10-CM | POA: Diagnosis not present

## 2016-03-30 DIAGNOSIS — I4891 Unspecified atrial fibrillation: Secondary | ICD-10-CM | POA: Diagnosis not present

## 2016-03-30 DIAGNOSIS — Z4682 Encounter for fitting and adjustment of non-vascular catheter: Secondary | ICD-10-CM | POA: Diagnosis not present

## 2016-03-30 DIAGNOSIS — Z7901 Long term (current) use of anticoagulants: Secondary | ICD-10-CM | POA: Diagnosis not present

## 2016-04-01 ENCOUNTER — Encounter (HOSPITAL_COMMUNITY): Payer: Self-pay

## 2016-04-01 ENCOUNTER — Encounter (HOSPITAL_COMMUNITY)
Admission: RE | Admit: 2016-04-01 | Discharge: 2016-04-01 | Disposition: A | Discharge status: Home / Self Care | Payer: PPO | Source: Ambulatory Visit | Attending: Cardiothoracic Surgery | Admitting: Cardiothoracic Surgery

## 2016-04-01 ENCOUNTER — Ambulatory Visit (HOSPITAL_COMMUNITY)
Admission: RE | Admit: 2016-04-01 | Discharge: 2016-04-01 | Disposition: A | Discharge status: Home / Self Care | Payer: PPO | Source: Ambulatory Visit | Attending: Cardiothoracic Surgery | Admitting: Cardiothoracic Surgery

## 2016-04-01 VITALS — BP 112/76 | HR 71 | Temp 98.0°F | Resp 18 | Ht 69.0 in | Wt 186.4 lb

## 2016-04-01 DIAGNOSIS — R350 Frequency of micturition: Secondary | ICD-10-CM | POA: Diagnosis not present

## 2016-04-01 DIAGNOSIS — J90 Pleural effusion, not elsewhere classified: Secondary | ICD-10-CM | POA: Insufficient documentation

## 2016-04-01 DIAGNOSIS — I442 Atrioventricular block, complete: Secondary | ICD-10-CM | POA: Diagnosis not present

## 2016-04-01 DIAGNOSIS — Z01818 Encounter for other preprocedural examination: Secondary | ICD-10-CM

## 2016-04-01 DIAGNOSIS — E0590 Thyrotoxicosis, unspecified without thyrotoxic crisis or storm: Secondary | ICD-10-CM | POA: Diagnosis not present

## 2016-04-01 DIAGNOSIS — I110 Hypertensive heart disease with heart failure: Secondary | ICD-10-CM | POA: Diagnosis not present

## 2016-04-01 DIAGNOSIS — J9811 Atelectasis: Secondary | ICD-10-CM | POA: Diagnosis not present

## 2016-04-01 DIAGNOSIS — Z01812 Encounter for preprocedural laboratory examination: Secondary | ICD-10-CM

## 2016-04-01 DIAGNOSIS — J941 Fibrothorax: Secondary | ICD-10-CM | POA: Diagnosis not present

## 2016-04-01 DIAGNOSIS — R091 Pleurisy: Secondary | ICD-10-CM | POA: Diagnosis not present

## 2016-04-01 DIAGNOSIS — J939 Pneumothorax, unspecified: Secondary | ICD-10-CM | POA: Diagnosis not present

## 2016-04-01 DIAGNOSIS — Z8673 Personal history of transient ischemic attack (TIA), and cerebral infarction without residual deficits: Secondary | ICD-10-CM | POA: Diagnosis not present

## 2016-04-01 DIAGNOSIS — I5042 Chronic combined systolic (congestive) and diastolic (congestive) heart failure: Secondary | ICD-10-CM | POA: Diagnosis not present

## 2016-04-01 DIAGNOSIS — I255 Ischemic cardiomyopathy: Secondary | ICD-10-CM | POA: Diagnosis not present

## 2016-04-01 DIAGNOSIS — K5900 Constipation, unspecified: Secondary | ICD-10-CM | POA: Diagnosis not present

## 2016-04-01 DIAGNOSIS — K5790 Diverticulosis of intestine, part unspecified, without perforation or abscess without bleeding: Secondary | ICD-10-CM | POA: Diagnosis not present

## 2016-04-01 DIAGNOSIS — J9382 Other air leak: Secondary | ICD-10-CM | POA: Diagnosis not present

## 2016-04-01 DIAGNOSIS — Z4682 Encounter for fitting and adjustment of non-vascular catheter: Secondary | ICD-10-CM | POA: Diagnosis not present

## 2016-04-01 DIAGNOSIS — J984 Other disorders of lung: Secondary | ICD-10-CM | POA: Diagnosis not present

## 2016-04-01 DIAGNOSIS — I4891 Unspecified atrial fibrillation: Secondary | ICD-10-CM | POA: Diagnosis not present

## 2016-04-01 DIAGNOSIS — Z7901 Long term (current) use of anticoagulants: Secondary | ICD-10-CM | POA: Diagnosis not present

## 2016-04-01 DIAGNOSIS — R0602 Shortness of breath: Secondary | ICD-10-CM | POA: Diagnosis not present

## 2016-04-01 DIAGNOSIS — C8593 Non-Hodgkin lymphoma, unspecified, intra-abdominal lymph nodes: Secondary | ICD-10-CM | POA: Diagnosis not present

## 2016-04-01 DIAGNOSIS — E039 Hypothyroidism, unspecified: Secondary | ICD-10-CM | POA: Diagnosis not present

## 2016-04-01 DIAGNOSIS — E785 Hyperlipidemia, unspecified: Secondary | ICD-10-CM | POA: Diagnosis not present

## 2016-04-01 HISTORY — DX: Presence of cardiac pacemaker: Z95.0

## 2016-04-01 HISTORY — DX: Anxiety disorder, unspecified: F41.9

## 2016-04-01 HISTORY — DX: Major depressive disorder, single episode, unspecified: F32.9

## 2016-04-01 LAB — COMPREHENSIVE METABOLIC PANEL
ALT: 14 U/L — ABNORMAL LOW (ref 17–63)
AST: 19 U/L (ref 15–41)
Albumin: 2.2 g/dL — ABNORMAL LOW (ref 3.5–5.0)
Alkaline Phosphatase: 108 U/L (ref 38–126)
Anion gap: 9 (ref 5–15)
BUN: 7 mg/dL (ref 6–20)
CO2: 25 mmol/L (ref 22–32)
Calcium: 8.1 mg/dL — ABNORMAL LOW (ref 8.9–10.3)
Chloride: 100 mmol/L — ABNORMAL LOW (ref 101–111)
Creatinine, Ser: 1.17 mg/dL (ref 0.61–1.24)
GFR calc Af Amer: >60 mL/min (ref >60)
GFR calc non Af Amer: >60 mL/min (ref >60)
Glucose, Bld: 120 mg/dL — ABNORMAL HIGH (ref 65–99)
Potassium: 3.9 mmol/L (ref 3.5–5.1)
Sodium: 134 mmol/L — ABNORMAL LOW (ref 135–145)
Total Bilirubin: 0.9 mg/dL (ref 0.3–1.2)
Total Protein: 5.6 g/dL — ABNORMAL LOW (ref 6.5–8.1)

## 2016-04-01 LAB — BLOOD GAS, ARTERIAL
Acid-Base Excess: 6.6 mmol/L — ABNORMAL HIGH (ref 0.0–2.0)
Bicarbonate: 30.2 mEq/L — ABNORMAL HIGH (ref 20.0–24.0)
Drawn by: 2036361
FIO2: 0.21
O2 Saturation: 93.6 %
Patient temperature: 98.6
TCO2: 31.5 mmol/L (ref 0–100)
pCO2 arterial: 40.7 mmHg (ref 35.0–45.0)
pH, Arterial: 7.483 — ABNORMAL HIGH (ref 7.350–7.450)
pO2, Arterial: 65.7 mmHg — ABNORMAL LOW (ref 80.0–100.0)

## 2016-04-01 LAB — CBC
HCT: 47.7 % (ref 39.0–52.0)
Hemoglobin: 16 g/dL (ref 13.0–17.0)
MCH: 31.7 pg (ref 26.0–34.0)
MCHC: 33.5 g/dL (ref 30.0–36.0)
MCV: 94.6 fL (ref 78.0–100.0)
Platelets: 233 10*3/uL (ref 150–400)
RBC: 5.04 MIL/uL (ref 4.22–5.81)
RDW: 14.5 % (ref 11.5–15.5)
WBC: 7.6 10*3/uL (ref 4.0–10.5)

## 2016-04-01 LAB — TYPE AND SCREEN
ABO/RH(D): O NEG
Antibody Screen: NEGATIVE

## 2016-04-01 LAB — PROTIME-INR
INR: 1.31 (ref 0.00–1.49)
Prothrombin Time: 16.4 seconds — ABNORMAL HIGH (ref 11.6–15.2)

## 2016-04-01 LAB — SURGICAL PCR SCREEN
MRSA, PCR: NEGATIVE
Staphylococcus aureus: NEGATIVE

## 2016-04-01 LAB — APTT: aPTT: 33 seconds (ref 24–37)

## 2016-04-01 LAB — ABO/RH: ABO/RH(D): O NEG

## 2016-04-01 MED ORDER — VANCOMYCIN HCL IN DEXTROSE 1-5 GM/200ML-% IV SOLN
1000.0000 mg | INTRAVENOUS | Status: AC
  Administered 2016-04-02: 1000 mg via INTRAVENOUS
  Filled 2016-04-01: qty 200

## 2016-04-01 NOTE — Progress Notes (Signed)
Peri-operative Implanted Device orders faxed to Dr. Sallyanne Kuster . Office called and stated he is not in the office today to fill out orders.  [facility] @ [place] and they said to fax the orders to them and they would see wheat they could do.  Paged [place] now rep. Has not returned call yet.

## 2016-04-01 NOTE — Pre-Procedure Instructions (Signed)
    Costello Winkowski [facility]  04/01/2016      [id number] [address], [place] - [place]   [address] Phone: [phone] Fax: [phone]    Your procedure is scheduled on 04-02-2016   Tuesday   Report to [facility] Admitting at 12 noon .  Call this number if you have problems the morning of surgery:  [phone]   Remember:  Do not eat food or drink liquids after midnight.   Take these medicines the morning of surgery with A SIP OF WATER tylenol if needed,Durezol eye drops,methimazole(Tapazole),metoprolol(Toprol XL),tramadol(ultram) if needed,   Do not wear jewelry, .  Do not wear lotions, powders, or perfumes.  You may not wear deoderant.  Do not shave 48 hours prior to surgery.  Men may shave face and neck.   Do not bring valuables to the hospital.  [facility] is not responsible for any belongings or valuables.  Contacts, dentures or bridgework may not be worn into surgery.  Leave your suitcase in the car.  After surgery it may be brought to your room.  For patients admitted to the hospital, discharge time will be determined by your treatment team.  Patients discharged the day of surgery will not be allowed to drive home   Special instructions:  See attached Sheet for instructions on CHG showers  Please read over the following fact sheets that you were given. Coughing and Deep Breathing, MRSA Information and Surgical Site Infection Prevention

## 2016-04-01 NOTE — Progress Notes (Signed)
St. Jude Rep  Windle Guard notified of date and time of Surgery.

## 2016-04-02 ENCOUNTER — Encounter (HOSPITAL_COMMUNITY)
Admission: RE | Disposition: A | Discharge status: Home Health | Payer: Self-pay | Source: Ambulatory Visit | Attending: Cardiothoracic Surgery

## 2016-04-02 ENCOUNTER — Inpatient Hospital Stay (HOSPITAL_COMMUNITY): Payer: PPO | Admitting: Certified Registered"

## 2016-04-02 ENCOUNTER — Ambulatory Visit: Payer: PPO | Admitting: Pulmonary Disease

## 2016-04-02 ENCOUNTER — Inpatient Hospital Stay (HOSPITAL_COMMUNITY): Payer: PPO

## 2016-04-02 ENCOUNTER — Inpatient Hospital Stay (HOSPITAL_COMMUNITY)
Admission: RE | Admit: 2016-04-02 | Discharge: 2016-04-09 | DRG: 164 | Disposition: A | Discharge status: Home Health | Payer: PPO | Source: Ambulatory Visit | Attending: Cardiothoracic Surgery | Admitting: Cardiothoracic Surgery

## 2016-04-02 ENCOUNTER — Encounter (HOSPITAL_COMMUNITY): Payer: Self-pay | Admitting: Surgery

## 2016-04-02 DIAGNOSIS — Z8673 Personal history of transient ischemic attack (TIA), and cerebral infarction without residual deficits: Secondary | ICD-10-CM | POA: Diagnosis not present

## 2016-04-02 DIAGNOSIS — I110 Hypertensive heart disease with heart failure: Secondary | ICD-10-CM | POA: Diagnosis present

## 2016-04-02 DIAGNOSIS — R0602 Shortness of breath: Secondary | ICD-10-CM | POA: Diagnosis not present

## 2016-04-02 DIAGNOSIS — I255 Ischemic cardiomyopathy: Secondary | ICD-10-CM | POA: Diagnosis present

## 2016-04-02 DIAGNOSIS — R350 Frequency of micturition: Secondary | ICD-10-CM | POA: Diagnosis not present

## 2016-04-02 DIAGNOSIS — K5900 Constipation, unspecified: Secondary | ICD-10-CM | POA: Diagnosis not present

## 2016-04-02 DIAGNOSIS — I5042 Chronic combined systolic (congestive) and diastolic (congestive) heart failure: Secondary | ICD-10-CM | POA: Diagnosis present

## 2016-04-02 DIAGNOSIS — C8593 Non-Hodgkin lymphoma, unspecified, intra-abdominal lymph nodes: Secondary | ICD-10-CM | POA: Diagnosis not present

## 2016-04-02 DIAGNOSIS — E785 Hyperlipidemia, unspecified: Secondary | ICD-10-CM | POA: Diagnosis not present

## 2016-04-02 DIAGNOSIS — Z4682 Encounter for fitting and adjustment of non-vascular catheter: Secondary | ICD-10-CM | POA: Diagnosis not present

## 2016-04-02 DIAGNOSIS — Z09 Encounter for follow-up examination after completed treatment for conditions other than malignant neoplasm: Secondary | ICD-10-CM

## 2016-04-02 DIAGNOSIS — J939 Pneumothorax, unspecified: Secondary | ICD-10-CM | POA: Diagnosis not present

## 2016-04-02 DIAGNOSIS — J9382 Other air leak: Secondary | ICD-10-CM | POA: Diagnosis not present

## 2016-04-02 DIAGNOSIS — E0590 Thyrotoxicosis, unspecified without thyrotoxic crisis or storm: Secondary | ICD-10-CM | POA: Diagnosis present

## 2016-04-02 DIAGNOSIS — I442 Atrioventricular block, complete: Secondary | ICD-10-CM | POA: Diagnosis not present

## 2016-04-02 DIAGNOSIS — J941 Fibrothorax: Secondary | ICD-10-CM | POA: Diagnosis not present

## 2016-04-02 DIAGNOSIS — J984 Other disorders of lung: Secondary | ICD-10-CM | POA: Diagnosis not present

## 2016-04-02 DIAGNOSIS — K5790 Diverticulosis of intestine, part unspecified, without perforation or abscess without bleeding: Secondary | ICD-10-CM | POA: Diagnosis present

## 2016-04-02 DIAGNOSIS — J90 Pleural effusion, not elsewhere classified: Secondary | ICD-10-CM | POA: Diagnosis not present

## 2016-04-02 DIAGNOSIS — I4891 Unspecified atrial fibrillation: Secondary | ICD-10-CM | POA: Diagnosis not present

## 2016-04-02 DIAGNOSIS — J9811 Atelectasis: Secondary | ICD-10-CM | POA: Diagnosis not present

## 2016-04-02 DIAGNOSIS — R091 Pleurisy: Secondary | ICD-10-CM | POA: Diagnosis not present

## 2016-04-02 DIAGNOSIS — Z7901 Long term (current) use of anticoagulants: Secondary | ICD-10-CM | POA: Diagnosis not present

## 2016-04-02 DIAGNOSIS — Z01818 Encounter for other preprocedural examination: Secondary | ICD-10-CM | POA: Diagnosis not present

## 2016-04-02 DIAGNOSIS — E039 Hypothyroidism, unspecified: Secondary | ICD-10-CM | POA: Diagnosis not present

## 2016-04-02 DIAGNOSIS — Z9689 Presence of other specified functional implants: Secondary | ICD-10-CM

## 2016-04-02 HISTORY — PX: VIDEO ASSISTED THORACOSCOPY: SHX5073

## 2016-04-02 HISTORY — PX: PLEURADESIS: SHX6030

## 2016-04-02 LAB — URINALYSIS, ROUTINE W REFLEX MICROSCOPIC
Glucose, UA: NEGATIVE mg/dL
Hgb urine dipstick: NEGATIVE
Ketones, ur: 15 mg/dL — AB
Nitrite: POSITIVE — AB
PROTEIN: NEGATIVE mg/dL
SPECIFIC GRAVITY, URINE: 1.031 — AB (ref 1.005–1.030)
pH: 5.5 (ref 5.0–8.0)

## 2016-04-02 LAB — BLOOD GAS, ARTERIAL
ACID-BASE EXCESS: 6.2 mmol/L — AB (ref 0.0–2.0)
BICARBONATE: 30.7 meq/L — AB (ref 20.0–24.0)
O2 CONTENT: 4 L/min
O2 Saturation: 98.7 %
PATIENT TEMPERATURE: 98.6
PCO2 ART: 48.6 mmHg — AB (ref 35.0–45.0)
PH ART: 7.416 (ref 7.350–7.450)
PO2 ART: 139 mmHg — AB (ref 80.0–100.0)
TCO2: 32.1 mmol/L (ref 0–100)

## 2016-04-02 LAB — URINE MICROSCOPIC-ADD ON: RBC / HPF: NONE SEEN RBC/hpf (ref 0–5)

## 2016-04-02 SURGERY — VIDEO ASSISTED THORACOSCOPY
Anesthesia: General | Site: Chest | Laterality: Right

## 2016-04-02 MED ORDER — ONDANSETRON HCL 4 MG/2ML IJ SOLN
INTRAMUSCULAR | Status: AC
  Filled 2016-04-02: qty 2

## 2016-04-02 MED ORDER — SUGAMMADEX SODIUM 200 MG/2ML IV SOLN
INTRAVENOUS | Status: DC | PRN
  Administered 2016-04-02: 170 mg via INTRAVENOUS

## 2016-04-02 MED ORDER — ONDANSETRON HCL 4 MG/2ML IJ SOLN
INTRAMUSCULAR | Status: DC | PRN
  Administered 2016-04-02: 4 mg via INTRAVENOUS

## 2016-04-02 MED ORDER — ONDANSETRON HCL 4 MG/2ML IJ SOLN
4.0000 mg | Freq: Four times a day (QID) | INTRAMUSCULAR | Status: DC | PRN
  Administered 2016-04-06: 4 mg via INTRAVENOUS
  Filled 2016-04-02: qty 2

## 2016-04-02 MED ORDER — LACTATED RINGERS IV SOLN
INTRAVENOUS | Status: DC
  Administered 2016-04-02 (×2): via INTRAVENOUS

## 2016-04-02 MED ORDER — TAMSULOSIN HCL 0.4 MG PO CAPS
0.4000 mg | ORAL_CAPSULE | Freq: Every day | ORAL | Status: DC
  Administered 2016-04-02 – 2016-04-08 (×7): 0.4 mg via ORAL
  Filled 2016-04-02 (×7): qty 1

## 2016-04-02 MED ORDER — ONDANSETRON HCL 4 MG/2ML IJ SOLN
4.0000 mg | Freq: Four times a day (QID) | INTRAMUSCULAR | Status: DC | PRN
  Administered 2016-04-05 – 2016-04-06 (×3): 4 mg via INTRAVENOUS
  Filled 2016-04-02 (×3): qty 2

## 2016-04-02 MED ORDER — PROMETHAZINE HCL 25 MG/ML IJ SOLN: 6.2500 mg | INTRAMUSCULAR | Status: DC | PRN

## 2016-04-02 MED ORDER — TRAMADOL HCL 50 MG PO TABS
50.0000 mg | ORAL_TABLET | Freq: Four times a day (QID) | ORAL | Status: DC | PRN
  Administered 2016-04-03 – 2016-04-08 (×4): 100 mg via ORAL
  Filled 2016-04-02 (×5): qty 2

## 2016-04-02 MED ORDER — PHENYLEPHRINE HCL 10 MG/ML IJ SOLN
INTRAMUSCULAR | Status: DC | PRN
  Administered 2016-04-02: 80 ug via INTRAVENOUS
  Administered 2016-04-02: 120 ug via INTRAVENOUS

## 2016-04-02 MED ORDER — HYDROMORPHONE HCL 1 MG/ML IJ SOLN
INTRAMUSCULAR | Status: DC | PRN
  Administered 2016-04-02: 1 mg via INTRAVENOUS

## 2016-04-02 MED ORDER — LIDOCAINE 2% (20 MG/ML) 5 ML SYRINGE
INTRAMUSCULAR | Status: AC
  Filled 2016-04-02: qty 5

## 2016-04-02 MED ORDER — NALOXONE HCL 0.4 MG/ML IJ SOLN: 0.4000 mg | INTRAMUSCULAR | Status: DC | PRN

## 2016-04-02 MED ORDER — WARFARIN 1.25 MG HALF TABLET
1.2500 mg | ORAL_TABLET | ORAL | Status: DC
  Administered 2016-04-04 – 2016-04-07 (×2): 1.25 mg via ORAL
  Filled 2016-04-02 (×3): qty 1

## 2016-04-02 MED ORDER — SODIUM CHLORIDE 0.9% FLUSH
9.0000 mL | INTRAVENOUS | Status: DC | PRN
  Administered 2016-04-08: 3 mL via INTRAVENOUS
  Filled 2016-04-02: qty 9

## 2016-04-02 MED ORDER — ROCURONIUM BROMIDE 50 MG/5ML IV SOLN
INTRAVENOUS | Status: AC
  Filled 2016-04-02: qty 1

## 2016-04-02 MED ORDER — FENTANYL 40 MCG/ML IV SOLN
INTRAVENOUS | Status: DC
  Administered 2016-04-02: 18:00:00 via INTRAVENOUS
  Administered 2016-04-02: 250 ug via INTRAVENOUS
  Administered 2016-04-03: 240 ug via INTRAVENOUS
  Administered 2016-04-03: 100 ug via INTRAVENOUS
  Administered 2016-04-03: 240 ug via INTRAVENOUS
  Administered 2016-04-03: 13:00:00 via INTRAVENOUS
  Administered 2016-04-03: 160 ug via INTRAVENOUS
  Administered 2016-04-03: 120 ug via INTRAVENOUS
  Administered 2016-04-04: 200 ug via INTRAVENOUS
  Administered 2016-04-04: 140 ug via INTRAVENOUS
  Administered 2016-04-04: 120 ug via INTRAVENOUS
  Administered 2016-04-04: 160 ug via INTRAVENOUS
  Administered 2016-04-04: 140 ug via INTRAVENOUS
  Administered 2016-04-04: 80 ug via INTRAVENOUS
  Administered 2016-04-05: 140 ug via INTRAVENOUS
  Administered 2016-04-05: 160 ug via INTRAVENOUS
  Administered 2016-04-05: 50 ug via INTRAVENOUS
  Administered 2016-04-05: 160 ug via INTRAVENOUS
  Administered 2016-04-05: 20 ug via INTRAVENOUS
  Administered 2016-04-06: 70 ug via INTRAVENOUS
  Administered 2016-04-06: 110 ug via INTRAVENOUS
  Administered 2016-04-06: 80 ug via INTRAVENOUS
  Administered 2016-04-06: 30 ug via INTRAVENOUS
  Administered 2016-04-06: 10 ug via INTRAVENOUS
  Administered 2016-04-06: 0 ug via INTRAVENOUS
  Administered 2016-04-07: 130 ug via INTRAVENOUS
  Administered 2016-04-07: 20 ug via INTRAVENOUS
  Administered 2016-04-07: 0 ug via INTRAVENOUS
  Administered 2016-04-07: 140 ug via INTRAVENOUS
  Administered 2016-04-07: 30 ug via INTRAVENOUS
  Administered 2016-04-07 – 2016-04-08 (×2): 20 ug via INTRAVENOUS
  Administered 2016-04-08: 260 ug via INTRAVENOUS
  Administered 2016-04-08: 80 ug via INTRAVENOUS
  Filled 2016-04-02 (×3): qty 25

## 2016-04-02 MED ORDER — 0.9 % SODIUM CHLORIDE (POUR BTL) OPTIME
TOPICAL | Status: DC | PRN
  Administered 2016-04-02: 2000 mL

## 2016-04-02 MED ORDER — HYDROMORPHONE HCL 1 MG/ML IJ SOLN
INTRAMUSCULAR | Status: AC
  Administered 2016-04-02: 0.5 mg via INTRAVENOUS
  Filled 2016-04-02: qty 1

## 2016-04-02 MED ORDER — OXYCODONE HCL 5 MG/5ML PO SOLN
5.0000 mg | Freq: Once | ORAL | Status: DC | PRN
Start: 2016-04-02 — End: 2016-04-02

## 2016-04-02 MED ORDER — HYDROCOD POLST-CPM POLST ER 10-8 MG/5ML PO SUER: 5.0000 mL | Freq: Every evening | ORAL | Status: DC | PRN

## 2016-04-02 MED ORDER — BISACODYL 5 MG PO TBEC
10.0000 mg | DELAYED_RELEASE_TABLET | Freq: Every day | ORAL | Status: DC
  Administered 2016-04-02 – 2016-04-09 (×6): 10 mg via ORAL
  Filled 2016-04-02 (×6): qty 2

## 2016-04-02 MED ORDER — FENTANYL 40 MCG/ML IV SOLN
INTRAVENOUS | Status: AC
  Filled 2016-04-02: qty 25

## 2016-04-02 MED ORDER — FUROSEMIDE 20 MG PO TABS
20.0000 mg | ORAL_TABLET | Freq: Every day | ORAL | Status: DC
  Administered 2016-04-02 – 2016-04-09 (×7): 20 mg via ORAL
  Filled 2016-04-02 (×7): qty 1

## 2016-04-02 MED ORDER — ACETAMINOPHEN 160 MG/5ML PO SOLN: 1000.0000 mg | Freq: Four times a day (QID) | ORAL | Status: AC

## 2016-04-02 MED ORDER — DIPHENHYDRAMINE HCL 50 MG/ML IJ SOLN: 12.5000 mg | Freq: Four times a day (QID) | INTRAMUSCULAR | Status: DC | PRN

## 2016-04-02 MED ORDER — PRAVASTATIN SODIUM 40 MG PO TABS
80.0000 mg | ORAL_TABLET | Freq: Every evening | ORAL | Status: DC
  Administered 2016-04-02 – 2016-04-08 (×6): 80 mg via ORAL
  Filled 2016-04-02 (×8): qty 2

## 2016-04-02 MED ORDER — TRAZODONE HCL 50 MG PO TABS
50.0000 mg | ORAL_TABLET | Freq: Every evening | ORAL | Status: DC | PRN
  Administered 2016-04-07 (×2): 50 mg via ORAL
  Filled 2016-04-02 (×2): qty 1

## 2016-04-02 MED ORDER — DIPHENHYDRAMINE HCL 12.5 MG/5ML PO ELIX: 12.5000 mg | ORAL_SOLUTION | Freq: Four times a day (QID) | ORAL | Status: DC | PRN

## 2016-04-02 MED ORDER — ALBUMIN HUMAN 5 % IV SOLN
12.5000 g | INTRAVENOUS | Status: DC | PRN
Start: 2016-04-02 — End: 2016-04-09

## 2016-04-02 MED ORDER — METOCLOPRAMIDE HCL 5 MG/ML IJ SOLN
10.0000 mg | Freq: Four times a day (QID) | INTRAMUSCULAR | Status: AC
  Administered 2016-04-02 – 2016-04-03 (×3): 10 mg via INTRAVENOUS
  Filled 2016-04-02 (×3): qty 2

## 2016-04-02 MED ORDER — POTASSIUM CHLORIDE IN NACL 20-0.45 MEQ/L-% IV SOLN
INTRAVENOUS | Status: DC
  Administered 2016-04-02 – 2016-04-04 (×2): via INTRAVENOUS
  Filled 2016-04-02 (×6): qty 1000

## 2016-04-02 MED ORDER — LEVALBUTEROL HCL 0.63 MG/3ML IN NEBU
0.6300 mg | INHALATION_SOLUTION | Freq: Four times a day (QID) | RESPIRATORY_TRACT | Status: DC
  Administered 2016-04-02 – 2016-04-03 (×5): 0.63 mg via RESPIRATORY_TRACT
  Filled 2016-04-02 (×5): qty 3

## 2016-04-02 MED ORDER — POTASSIUM CHLORIDE 10 MEQ/50ML IV SOLN: 10.0000 meq | Freq: Every day | INTRAVENOUS | Status: DC | PRN

## 2016-04-02 MED ORDER — EPHEDRINE SULFATE 50 MG/ML IJ SOLN
INTRAMUSCULAR | Status: DC | PRN
  Administered 2016-04-02 (×2): 10 mg via INTRAVENOUS

## 2016-04-02 MED ORDER — HYDROCORTISONE 1 % EX CREA
1.0000 "application " | TOPICAL_CREAM | Freq: Two times a day (BID) | CUTANEOUS | Status: DC | PRN
  Filled 2016-04-02: qty 28

## 2016-04-02 MED ORDER — WARFARIN - PHYSICIAN DOSING INPATIENT
Freq: Every day | Status: DC
  Administered 2016-04-03 – 2016-04-07 (×4)

## 2016-04-02 MED ORDER — ROCURONIUM BROMIDE 100 MG/10ML IV SOLN
INTRAVENOUS | Status: DC | PRN
  Administered 2016-04-02: 50 mg via INTRAVENOUS

## 2016-04-02 MED ORDER — ALBUMIN HUMAN 5 % IV SOLN
12.5000 g | Freq: Once | INTRAVENOUS | Status: AC
  Administered 2016-04-02: 12.5 g via INTRAVENOUS
  Filled 2016-04-02: qty 250

## 2016-04-02 MED ORDER — SUGAMMADEX SODIUM 200 MG/2ML IV SOLN
INTRAVENOUS | Status: AC
  Filled 2016-04-02: qty 2

## 2016-04-02 MED ORDER — DOXYCYCLINE HYCLATE 100 MG IV SOLR
500.0000 mg | Freq: Once | INTRAVENOUS | Status: AC
  Administered 2016-04-02: 500 mg via INTRAPLEURAL
  Filled 2016-04-02: qty 500

## 2016-04-02 MED ORDER — OXYCODONE HCL 5 MG PO TABS
5.0000 mg | ORAL_TABLET | ORAL | Status: DC | PRN
  Administered 2016-04-02: 10 mg via ORAL
  Administered 2016-04-03: 5 mg via ORAL
  Administered 2016-04-03: 10 mg via ORAL
  Administered 2016-04-03: 5 mg via ORAL
  Administered 2016-04-03 – 2016-04-05 (×5): 10 mg via ORAL
  Administered 2016-04-06 – 2016-04-07 (×3): 5 mg via ORAL
  Administered 2016-04-08 (×2): 10 mg via ORAL
  Filled 2016-04-02 (×2): qty 2
  Filled 2016-04-02: qty 1
  Filled 2016-04-02 (×2): qty 2
  Filled 2016-04-02 (×2): qty 1
  Filled 2016-04-02: qty 2
  Filled 2016-04-02: qty 1
  Filled 2016-04-02 (×5): qty 2

## 2016-04-02 MED ORDER — SODIUM CHLORIDE 0.9 % IV SOLN
500.0000 mg | INTRAVENOUS | Status: DC
  Filled 2016-04-02: qty 500

## 2016-04-02 MED ORDER — METHIMAZOLE 10 MG PO TABS
60.0000 mg | ORAL_TABLET | Freq: Every day | ORAL | Status: DC
  Administered 2016-04-02 – 2016-04-09 (×8): 60 mg via ORAL
  Filled 2016-04-02 (×8): qty 6

## 2016-04-02 MED ORDER — SENNOSIDES-DOCUSATE SODIUM 8.6-50 MG PO TABS
1.0000 | ORAL_TABLET | Freq: Every day | ORAL | Status: DC
  Administered 2016-04-02 – 2016-04-08 (×6): 1 via ORAL
  Filled 2016-04-02 (×7): qty 1

## 2016-04-02 MED ORDER — LIDOCAINE HCL (CARDIAC) 20 MG/ML IV SOLN
INTRAVENOUS | Status: DC | PRN
  Administered 2016-04-02: 100 mg via INTRAVENOUS

## 2016-04-02 MED ORDER — DIFLUPREDNATE 0.05 % OP EMUL: 1.0000 [drp] | Freq: Two times a day (BID) | OPHTHALMIC | Status: DC

## 2016-04-02 MED ORDER — OXYCODONE HCL 5 MG PO TABS: 5.0000 mg | ORAL_TABLET | Freq: Once | ORAL | Status: DC | PRN

## 2016-04-02 MED ORDER — ACETAMINOPHEN 500 MG PO TABS
1000.0000 mg | ORAL_TABLET | Freq: Four times a day (QID) | ORAL | Status: AC
  Administered 2016-04-02 – 2016-04-07 (×16): 1000 mg via ORAL
  Filled 2016-04-02 (×18): qty 2

## 2016-04-02 MED ORDER — HYDROMORPHONE HCL 1 MG/ML IJ SOLN
INTRAMUSCULAR | Status: AC
  Filled 2016-04-02: qty 1

## 2016-04-02 MED ORDER — PHENYLEPHRINE HCL 10 MG/ML IJ SOLN
10.0000 mg | INTRAVENOUS | Status: DC | PRN
  Administered 2016-04-02: 40 ug/min via INTRAVENOUS

## 2016-04-02 MED ORDER — HYDROMORPHONE HCL 1 MG/ML IJ SOLN
0.2500 mg | INTRAMUSCULAR | Status: DC | PRN
  Administered 2016-04-02: 0.5 mg via INTRAVENOUS

## 2016-04-02 MED ORDER — PROPOFOL 10 MG/ML IV BOLUS
INTRAVENOUS | Status: DC | PRN
  Administered 2016-04-02: 120 mg via INTRAVENOUS

## 2016-04-02 MED ORDER — METOPROLOL SUCCINATE ER 25 MG PO TB24
25.0000 mg | ORAL_TABLET | Freq: Every day | ORAL | Status: DC
  Administered 2016-04-04 – 2016-04-09 (×6): 25 mg via ORAL
  Filled 2016-04-02 (×6): qty 1

## 2016-04-02 MED ORDER — FENTANYL CITRATE (PF) 100 MCG/2ML IJ SOLN
INTRAMUSCULAR | Status: DC | PRN
  Administered 2016-04-02: 50 ug via INTRAVENOUS
  Administered 2016-04-02: 200 ug via INTRAVENOUS

## 2016-04-02 MED ORDER — WARFARIN SODIUM 5 MG PO TABS
2.5000 mg | ORAL_TABLET | ORAL | Status: AC
  Administered 2016-04-03: 2.5 mg via ORAL
  Filled 2016-04-02: qty 1

## 2016-04-02 MED ORDER — FENTANYL CITRATE (PF) 250 MCG/5ML IJ SOLN
INTRAMUSCULAR | Status: AC
  Filled 2016-04-02: qty 5

## 2016-04-02 MED ORDER — WARFARIN 1.25 MG HALF TABLET: 1.2500 mg | ORAL_TABLET | ORAL | Status: DC

## 2016-04-02 MED ORDER — VANCOMYCIN HCL IN DEXTROSE 1-5 GM/200ML-% IV SOLN
1000.0000 mg | Freq: Two times a day (BID) | INTRAVENOUS | Status: AC
  Administered 2016-04-03: 1000 mg via INTRAVENOUS
  Filled 2016-04-02: qty 200

## 2016-04-02 SURGICAL SUPPLY — 65 items
APPLICATOR TIP EXT COSEAL (VASCULAR PRODUCTS) ×4 IMPLANT
BAG DECANTER FOR FLEXI CONT (MISCELLANEOUS) IMPLANT
BLADE SURG 11 STRL SS (BLADE) ×4 IMPLANT
CANISTER SUCTION 2500CC (MISCELLANEOUS) ×4 IMPLANT
CATH EMB 2FR 60CM (CATHETERS) ×4 IMPLANT
CATH KIT ON Q 5IN SLV (PAIN MANAGEMENT) IMPLANT
CATH ROBINSON RED A/P 18FR (CATHETERS) ×4 IMPLANT
CATH ROBINSON RED A/P 22FR (CATHETERS) ×12 IMPLANT
CATH THORACIC 28FR (CATHETERS) IMPLANT
CATH THORACIC 36FR (CATHETERS) ×4 IMPLANT
CATH THORACIC 36FR RT ANG (CATHETERS) IMPLANT
CONT SPEC 4OZ CLIKSEAL STRL BL (MISCELLANEOUS) ×20 IMPLANT
COVER SURGICAL LIGHT HANDLE (MISCELLANEOUS) ×8 IMPLANT
DERMABOND ADVANCED (GAUZE/BANDAGES/DRESSINGS) ×2
DERMABOND ADVANCED .7 DNX12 (GAUZE/BANDAGES/DRESSINGS) ×2 IMPLANT
DRAPE LAPAROSCOPIC ABDOMINAL (DRAPES) ×4 IMPLANT
DRAPE WARM FLUID 44X44 (DRAPE) ×4 IMPLANT
DRSG OPSITE 6X11 MED (GAUZE/BANDAGES/DRESSINGS) ×8 IMPLANT
ELECT BLADE 6.5 EXT (BLADE) ×4 IMPLANT
ELECT REM PT RETURN 9FT ADLT (ELECTROSURGICAL) ×4
ELECTRODE REM PT RTRN 9FT ADLT (ELECTROSURGICAL) ×2 IMPLANT
GAUZE SPONGE 4X4 12PLY STRL (GAUZE/BANDAGES/DRESSINGS) ×4 IMPLANT
GLOVE BIO SURGEON STRL SZ7.5 (GLOVE) ×8 IMPLANT
GOWN STRL REUS W/ TWL LRG LVL3 (GOWN DISPOSABLE) ×6 IMPLANT
GOWN STRL REUS W/TWL LRG LVL3 (GOWN DISPOSABLE) ×9
KIT BASIN OR (CUSTOM PROCEDURE TRAY) ×4 IMPLANT
KIT ROOM TURNOVER OR (KITS) ×4 IMPLANT
KIT SUCTION CATH 14FR (SUCTIONS) ×4 IMPLANT
NS IRRIG 1000ML POUR BTL (IV SOLUTION) ×8 IMPLANT
PACK CHEST (CUSTOM PROCEDURE TRAY) ×4 IMPLANT
PAD ARMBOARD 7.5X6 YLW CONV (MISCELLANEOUS) ×8 IMPLANT
SEALANT SURG COSEAL 4ML (VASCULAR PRODUCTS) ×4 IMPLANT
SOLUTION ANTI FOG 6CC (MISCELLANEOUS) ×4 IMPLANT
SPONGE GAUZE 4X4 12PLY STER LF (GAUZE/BANDAGES/DRESSINGS) ×4 IMPLANT
SPONGE TONSIL 1 RF SGL (DISPOSABLE) ×4 IMPLANT
SUT CHROMIC 3 0 SH 27 (SUTURE) IMPLANT
SUT CHROMIC 4 0 SH 27 (SUTURE) ×4 IMPLANT
SUT ETHILON 3 0 PS 1 (SUTURE) IMPLANT
SUT PROLENE 3 0 SH DA (SUTURE) IMPLANT
SUT PROLENE 4 0 RB 1 (SUTURE)
SUT PROLENE 4-0 RB1 .5 CRCL 36 (SUTURE) IMPLANT
SUT SILK  1 MH (SUTURE) ×4
SUT SILK 1 MH (SUTURE) ×4 IMPLANT
SUT SILK 2 0SH CR/8 30 (SUTURE) IMPLANT
SUT SILK 3 0SH CR/8 30 (SUTURE) IMPLANT
SUT VIC AB 1 CTX 18 (SUTURE) ×4 IMPLANT
SUT VIC AB 2 TP1 27 (SUTURE) IMPLANT
SUT VIC AB 2-0 CT2 18 VCP726D (SUTURE) IMPLANT
SUT VIC AB 2-0 CTX 36 (SUTURE) ×4 IMPLANT
SUT VIC AB 3-0 SH 18 (SUTURE) IMPLANT
SUT VIC AB 3-0 X1 27 (SUTURE) IMPLANT
SUT VICRYL 0 UR6 27IN ABS (SUTURE) IMPLANT
SUT VICRYL 2 TP 1 (SUTURE) ×4 IMPLANT
SWAB COLLECTION DEVICE MRSA (MISCELLANEOUS) IMPLANT
SYR 50ML LL SCALE MARK (SYRINGE) ×3 IMPLANT
SYR TB 1ML LUER SLIP (SYRINGE) ×4 IMPLANT
SYR TOOMEY 50ML (SYRINGE) ×4 IMPLANT
SYSTEM SAHARA CHEST DRAIN ATS (WOUND CARE) ×4 IMPLANT
TIP APPLICATOR SPRAY EXTEND 16 (VASCULAR PRODUCTS) IMPLANT
TOWEL OR 17X24 6PK STRL BLUE (TOWEL DISPOSABLE) ×4 IMPLANT
TOWEL OR 17X26 10 PK STRL BLUE (TOWEL DISPOSABLE) ×8 IMPLANT
TRAP SPECIMEN MUCOUS 40CC (MISCELLANEOUS) ×8 IMPLANT
TRAY FOLEY CATH 16FRSI W/METER (SET/KITS/TRAYS/PACK) ×4 IMPLANT
TUBE ANAEROBIC SPECIMEN COL (MISCELLANEOUS) IMPLANT
WATER STERILE IRR 1000ML POUR (IV SOLUTION) ×8 IMPLANT

## 2016-04-02 NOTE — Progress Notes (Signed)
The patient was examined and preop studies reviewed. There has been no change from the prior exam and the patient is ready for surgery.  plan Right VATS and drain pleural effusion, doxycycline pleurodesis on D [place]

## 2016-04-02 NOTE — Anesthesia Preprocedure Evaluation (Addendum)
Anesthesia Evaluation  Patient identified by MRN, date of birth, ID band Patient awake    Reviewed: Allergy & Precautions, NPO status , Patient's Chart, lab work & pertinent test results, reviewed documented beta blocker date and time   History of Anesthesia Complications Negative for: history of anesthetic complications  Airway Mallampati: I  TM Distance: >3 FB Neck ROM: Full    Dental  (+) Dental Advisory Given, Caps   Pulmonary  cardiogenic pleural effusion   breath sounds clear to auscultation       Cardiovascular hypertension, Pt. on medications and Pt. on home beta blockers (-) angina+ CAD, + Peripheral Vascular Disease and +CHF  + dysrhythmias Atrial Fibrillation + pacemaker (complete heart block)  Rhythm:Regular Rate:Normal  2/17 ECHO: EF 45-50%, antero-septal akinesis '13 STRESS: inferolat scar, no ischemia, EF 44%   Neuro/Psych TIANo Residual Symptoms    GI/Hepatic Neg liver ROS, GERD (esophageal stricture)  Controlled,  Endo/Other  Hypothyroidism   Renal/GU Renal disease     Musculoskeletal   Abdominal   Peds  Hematology  (+) Blood dyscrasia (lymphoma, coumadin last taken sunday ), ,   Anesthesia Other Findings St Jude rep has set pacemaker to DOO at 80, will eval and reset in pacu after case  Reproductive/Obstetrics                            Anesthesia Physical  Anesthesia Plan  ASA: III  Anesthesia Plan: General   Post-op Pain Management:    Induction: Intravenous  Airway Management Planned: Double Lumen EBT  Additional Equipment: Arterial line  Intra-op Plan:   Post-operative Plan: Possible Post-op intubation/ventilation  Informed Consent: I have reviewed the patients History and Physical, chart, labs and discussed the procedure including the risks, benefits and alternatives for the proposed anesthesia with the patient or authorized representative who has  indicated his/her understanding and acceptance.   Dental advisory given  Plan Discussed with: CRNA and Surgeon  Anesthesia Plan Comments:         Anesthesia Quick Evaluation

## 2016-04-02 NOTE — Transfer of Care (Signed)
Immediate Anesthesia Transfer of Care Note  Patient: Benjamin Mckay  Procedure(s) Performed: Procedure(s): Right VIDEO ASSISTED THORACOSCOPY with Biopsies and drainage pleural effusion (Right) PLEURADESIS (N/A)  Patient Location: PACU  Anesthesia Type:General  Level of Consciousness: awake, alert , oriented and patient cooperative  Airway & Oxygen Therapy: Patient Spontanous Breathing and Patient connected to face mask oxygen  Post-op Assessment: Report given to RN, Post -op Vital signs reviewed and stable and Patient moving all extremities X 4  Post vital signs: Reviewed and stable  Last Vitals:  Filed Vitals:   04/02/16 1212  BP: 136/75  Pulse: 65  Temp: 36.7 C  Resp: 18    Last Pain: There were no vitals filed for this visit.    Patients Stated Pain Goal: 1 ([id number])  Complications: No apparent anesthesia complications

## 2016-04-02 NOTE — Anesthesia Procedure Notes (Signed)
Procedure Name: Intubation Date/Time: 04/02/2016 3:17 PM Performed by: Lance Coon Pre-anesthesia Checklist: Patient identified, Timeout performed, Emergency Drugs available, Suction available and Patient being monitored Patient Re-evaluated:Patient Re-evaluated prior to inductionOxygen Delivery Method: Circle system utilized Preoxygenation: Pre-oxygenation with 100% oxygen Intubation Type: IV induction Ventilation: Mask ventilation without difficulty Laryngoscope Size: Mac and 4 Grade View: Grade II Tube type: Oral Endobronchial tube: Left, Double lumen EBT, EBT position confirmed by auscultation and EBT position confirmed by fiberoptic bronchoscope and 39 Fr Number of attempts: 1 Airway Equipment and Method: Stylet Placement Confirmation: ETT inserted through vocal cords under direct vision,  positive ETCO2 and breath sounds checked- equal and bilateral Secured at: 31 cm Tube secured with: Tape Dental Injury: Teeth and Oropharynx as per pre-operative assessment

## 2016-04-03 ENCOUNTER — Inpatient Hospital Stay (HOSPITAL_COMMUNITY): Payer: PPO

## 2016-04-03 ENCOUNTER — Encounter (HOSPITAL_COMMUNITY): Payer: Self-pay | Admitting: Cardiothoracic Surgery

## 2016-04-03 LAB — CBC
HCT: 41.6 % (ref 39.0–52.0)
Hemoglobin: 13.7 g/dL (ref 13.0–17.0)
MCH: 30.8 pg (ref 26.0–34.0)
MCHC: 32.9 g/dL (ref 30.0–36.0)
MCV: 93.5 fL (ref 78.0–100.0)
PLATELETS: 248 10*3/uL (ref 150–400)
RBC: 4.45 MIL/uL (ref 4.22–5.81)
RDW: 14.5 % (ref 11.5–15.5)
WBC: 11.3 10*3/uL — ABNORMAL HIGH (ref 4.0–10.5)

## 2016-04-03 LAB — BLOOD GAS, ARTERIAL
Acid-Base Excess: 6 mmol/L — ABNORMAL HIGH (ref 0.0–2.0)
BICARBONATE: 30.7 meq/L — AB (ref 20.0–24.0)
O2 CONTENT: 1 L/min
O2 Saturation: 98 %
PCO2 ART: 51 mmHg — AB (ref 35.0–45.0)
PH ART: 7.397 (ref 7.350–7.450)
Patient temperature: 98.6
TCO2: 32.3 mmol/L (ref 0–100)
pO2, Arterial: 108 mmHg — ABNORMAL HIGH (ref 80.0–100.0)

## 2016-04-03 LAB — ACID FAST SMEAR (AFB, MYCOBACTERIA): Acid Fast Smear: NEGATIVE

## 2016-04-03 LAB — BASIC METABOLIC PANEL
Anion gap: 6 (ref 5–15)
BUN: 8 mg/dL (ref 6–20)
CHLORIDE: 100 mmol/L — AB (ref 101–111)
CO2: 28 mmol/L (ref 22–32)
CREATININE: 0.79 mg/dL (ref 0.61–1.24)
Calcium: 7.7 mg/dL — ABNORMAL LOW (ref 8.9–10.3)
GFR calc non Af Amer: >60 mL/min (ref >60)
Glucose, Bld: 106 mg/dL — ABNORMAL HIGH (ref 65–99)
POTASSIUM: 3.8 mmol/L (ref 3.5–5.1)
Sodium: 134 mmol/L — ABNORMAL LOW (ref 135–145)

## 2016-04-03 MED ORDER — ALBUMIN HUMAN 5 % IV SOLN
12.5000 g | Freq: Once | INTRAVENOUS | Status: AC
  Administered 2016-04-03: 12.5 g via INTRAVENOUS
  Filled 2016-04-03: qty 250

## 2016-04-03 MED ORDER — LEVALBUTEROL HCL 0.63 MG/3ML IN NEBU
0.6300 mg | INHALATION_SOLUTION | Freq: Two times a day (BID) | RESPIRATORY_TRACT | Status: DC
  Administered 2016-04-04 – 2016-04-06 (×6): 0.63 mg via RESPIRATORY_TRACT
  Filled 2016-04-03 (×7): qty 3

## 2016-04-03 MED ORDER — LUNG SURGERY BOOK
Freq: Once | Status: AC
  Administered 2016-04-03: 05:00:00
  Filled 2016-04-03: qty 1

## 2016-04-03 NOTE — Care Management Important Message (Signed)
Important Message  Patient Details  Name: Benjamin Mckay MRN: [id number] Date of Birth: [birth year]   Medicare Important Message Given:  Yes    Loann Quill 04/03/2016, 1:46 PM

## 2016-04-03 NOTE — Op Note (Signed)
NAMETILGHMAN, AQUILAR NO.:  [id number]  MEDICAL RECORD NO.:  [id number]  LOCATION:  3S10C                        FACILITY:  [facility]  PHYSICIAN:  Ivin Poot, M.D.  DATE OF BIRTH:  [birth year]  DATE OF PROCEDURE:  04/02/2016 DATE OF DISCHARGE:                              OPERATIVE REPORT   OPERATIONS: 1. Right Video-assisted thoracoscopic surgery, drainage of pleural     effusion with chemical pleurodesis using doxycycline. 2. Pleural biopsy.  SURGEON:  Ivin Poot, M.D.  ASSISTANT:  Altamese Barton,  S.A. -C.  ANESTHESIA:  General.  PREOPERATIVE DIAGNOSIS:  Large loculated pleural effusion, status post previous PleurX catheter placement.  POSTOPERATIVE DIAGNOSIS:  Large loculated pleural effusion, status post previous PleurX catheter placement.  CLINICAL NOTE:  The patient is a 70 year old male with history of heart failure and chronic AFib, who over the past 6 months, developed a large right pleural effusion.  This was initially treated with a PleurX catheter; however, could not completely drain the effusion, since it was partially loculated.  The PleurX catheter did drain usually a liter of fluid at each drainage session.  However, the patient had persistent cough and a significant amount of fluid on chest x-ray.  For that reason, a right VATS to open the loculations and completely drain the pleural effusion was recommended as well as combined chemical pleurodesis using doxycycline.  I examined the patient and discussed this procedure with him and reviewed the expected benefits, the alternatives and risks including recurrent pleural effusion, bleeding, infection, ventilator dependence, prolonged air leak, death.  He demonstrated his understanding and agreed to proceed with surgery.  OPERATIVE FINDINGS: 1. 3 liters of yellow fluid was drained from the right chest through     the VATS incision. 2. Multiple pleural adhesions and loculated  pockets of fluid were     encountered and these were all opened up and drained. 3. Fluid for cytology and culture was sent as well as pleural peel for     cytology and culture sent. 4. A nodular irregularity on the visceral pleura of the right lower     lobe was biopsied and sent for frozen section, which returned     inflammation changes only, no evidence of malignancy.  DESCRIPTION OF PROCEDURE:  The patient was brought to the operating room and placed supine on the operating table where general anesthesia was induced and a double-lumen endotracheal tube was passed by the Anesthesia team.  The patient was then rolled to expose right side up. The right chest was prepped and draped as a sterile field.  A proper time-out was performed.  A small incision was made beneath the tip of the scapula and through that, a VATS port was inserted.  Through the port, a suction catheter was placed in the pleural space and 3 liters of fluid was removed.  The camera was then inserted through the VATS port and the hemithorax was examined.  There was inflamed pleural thickening over the hemithorax, also covering the majority of the right lower lobe with fibrinous adhesions loculating the fluid from the position of the PleurX catheter, which had been placed in the subpulmonic position above the  diaphragm.  The PleurX catheter was easily identified and was in good position.  It was cleaned of any protein film over the side holes.  After opening up all the adhesions, the mechanical pleurodesis of the apex was carried out.  A chemical pleurodesis with 500 mg of doxycycline was carried out.  Thickened pleura was sent for both pathology and cultures (routine, AFB fungal).  A nodular almost necrotic area on the visceral surface of the lower lobe was shaved off and sent for frozen section, pathology and this returned inflammatory changes only.  This area of the biopsy was coated with a fine layer of CoSeal  medical adhesive.  I then placed a straight 36-French chest tube posteriorly to the apex through a separate incision.  The 5-6 cm incision in the sixth interspace was then closed using one pericostal suture and then interrupted #1 Vicryl for the muscle layer. A running subcutaneous layer of Vicryl and then a subcuticular layer of Vicryl was placed.  The chest tube was connected to an underwater seal suction chamber and sterile dressings applied.  The patient was then turned supine, extubated and then transported to the recovery room.     Ivin Poot, M.D.     PV/MEDQ  D:  04/02/2016  T:  04/03/2016  Job:  [id number]

## 2016-04-03 NOTE — Progress Notes (Signed)
1 Day Post-Op Procedure(s) (LRB): Right VIDEO ASSISTED THORACOSCOPY with Biopsies and drainage pleural effusion (Right) PLEURADESIS (N/A) Subjective: Stable after right VATS, drainage of 3 L of loculated pleural effusion and doxycycline pleurodesis  Chest tube placed to water seal today, no air leak with cough Chest x-ray shows entrapment of right lower lobe which will we will improve with time and deep breathing exercises  We'll remove A-line today  Objective: Vital signs in last 24 hours: Temp:  [97.3 F (36.3 C)-98.1 F (36.7 C)] 97.7 F (36.5 C) (06/21 0750) Pulse Rate:  [65-81] 80 (06/21 0750) Cardiac Rhythm:  [-] Ventricular paced (06/21 0700) Resp:  [0-120] 120 (06/21 0750) BP: (86-136)/(51-75) 92/54 mmHg (06/21 0750) SpO2:  [94 %-100 %] 94 % (06/21 0750) Arterial Line BP: (110-135)/(48-64) 115/53 mmHg (06/21 0600) Weight:  [186 lb 7 oz (84.567 kg)] 186 lb 7 oz (84.567 kg) (06/20 1212)  Hemodynamic parameters for last 24 hours:  stable  Intake/Output from previous day: 06/20 0701 - 06/21 0700 In: 2541.7 [P.O.:240; I.V.:2301.7] Out: 800 [Urine:350; Blood:150; Chest Tube:300] Intake/Output this shift: Total I/O In: -  Out: 170 [Chest Tube:170]  No airleak through Pleur-evac Breath sounds clear Neuro intact Heart rhythm regular  Lab Results:  Recent Labs  04/01/16 1329 04/03/16 0430  WBC 7.6 11.3*  HGB 16.0 13.7  HCT 47.7 41.6  PLT 233 248   BMET:  Recent Labs  04/01/16 1329 04/03/16 0430  NA 134* 134*  K 3.9 3.8  CL 100* 100*  CO2 25 28  GLUCOSE 120* 106*  BUN 7 8  CREATININE 1.17 0.79  CALCIUM 8.1* 7.7*    PT/INR:  Recent Labs  04/01/16 1329  LABPROT 16.4*  INR 1.31   ABG    Component Value Date/Time   PHART 7.397 04/03/2016 0430   HCO3 30.7* 04/03/2016 0430   TCO2 32.3 04/03/2016 0430   O2SAT 98.0 04/03/2016 0430   CBG (last 3)  No results for input(s): GLUCAP in the last 72 hours.  Assessment/Plan: S/P Procedure(s)  (LRB): Right VIDEO ASSISTED THORACOSCOPY with Biopsies and drainage pleural effusion (Right) PLEURADESIS (N/A) We'll remove A-line, reduce IV fluids today If airleak remains resolved will DC tube probably tomorrow and transition to Pleurx drainage sessions for pleural fluid Procedure discussed in detail with the patient today including the incomplete reexpansion of his right lower lobe after removal of all the fluid   LOS: 1 day    Benjamin Mckay 04/03/2016

## 2016-04-03 NOTE — Progress Notes (Signed)
Pt. BBS clear and diminished. Pt. Appears in no distress, normal breathing pattern. Pt. Does not take tx. At home. No issues at this time.

## 2016-04-04 ENCOUNTER — Inpatient Hospital Stay (HOSPITAL_COMMUNITY): Payer: PPO

## 2016-04-04 LAB — COMPREHENSIVE METABOLIC PANEL
ALK PHOS: 81 U/L (ref 38–126)
ALT: 9 U/L — AB (ref 17–63)
AST: 17 U/L (ref 15–41)
Albumin: 2.1 g/dL — ABNORMAL LOW (ref 3.5–5.0)
Anion gap: 7 (ref 5–15)
BUN: 7 mg/dL (ref 6–20)
CALCIUM: 8.1 mg/dL — AB (ref 8.9–10.3)
CHLORIDE: 95 mmol/L — AB (ref 101–111)
CO2: 30 mmol/L (ref 22–32)
CREATININE: 0.99 mg/dL (ref 0.61–1.24)
GFR calc non Af Amer: >60 mL/min (ref >60)
Glucose, Bld: 80 mg/dL (ref 65–99)
Potassium: 4.4 mmol/L (ref 3.5–5.1)
SODIUM: 132 mmol/L — AB (ref 135–145)
Total Bilirubin: 1.6 mg/dL — ABNORMAL HIGH (ref 0.3–1.2)
Total Protein: 5 g/dL — ABNORMAL LOW (ref 6.5–8.1)

## 2016-04-04 LAB — CBC
HCT: 43.5 % (ref 39.0–52.0)
HEMOGLOBIN: 14.1 g/dL (ref 13.0–17.0)
MCH: 30.6 pg (ref 26.0–34.0)
MCHC: 32.4 g/dL (ref 30.0–36.0)
MCV: 94.4 fL (ref 78.0–100.0)
PLATELETS: 262 10*3/uL (ref 150–400)
RBC: 4.61 MIL/uL (ref 4.22–5.81)
RDW: 14.7 % (ref 11.5–15.5)
WBC: 13.1 10*3/uL — AB (ref 4.0–10.5)

## 2016-04-04 LAB — PROTIME-INR
INR: 1.36 (ref 0.00–1.49)
Prothrombin Time: 16.9 seconds — ABNORMAL HIGH (ref 11.6–15.2)

## 2016-04-04 MED ORDER — SODIUM CHLORIDE 0.9 % IV BOLUS (SEPSIS)
500.0000 mL | Freq: Once | INTRAVENOUS | Status: AC
  Administered 2016-04-04: 500 mL via INTRAVENOUS

## 2016-04-04 MED ORDER — WARFARIN SODIUM 5 MG PO TABS
2.5000 mg | ORAL_TABLET | ORAL | Status: DC
  Administered 2016-04-05 – 2016-04-08 (×3): 2.5 mg via ORAL
  Filled 2016-04-04 (×4): qty 1

## 2016-04-04 MED ORDER — SORBITOL 70 % PO SOLN
30.0000 mL | Freq: Once | ORAL | Status: AC
  Administered 2016-04-04: 30 mL via ORAL
  Filled 2016-04-04: qty 30

## 2016-04-04 NOTE — Discharge Summary (Signed)
Physician Discharge Summary       [address]       [place] [address]             [phone]    Patient ID: Benjamin Mckay MRN: [id number] DOB/AGE: 22-Oct-1945 70 y.o.  Admit date: 04/02/2016 Discharge date: 04/08/2016  Admission Diagnoses: Large, loculated right pleural effusion  Active Diagnoses:  1.Atrial fibrillation (HCC) 2.Lymphoma of of colon [facility]' 3.Esophageal stricture 4.Hyperlipidemia 5.Hypertension 6.CVA (cerebral infarction) 7.CHB (complete heart block) (HCC) 8.Colon polyps 9.Cardiomyopathy, ischemic 10. Chronic combined systolic and diastolic CHF (congestive heart failure) ([facility]) 11. Hyperthyroidism 12. Urinary frequency 13. Diverticulosis Procedure (s):  1. Right Video-assisted thoracoscopic surgery, drainage of pleural  effusion with chemical pleurodesis using doxycycline. 2. Pleural biopsy by Dr. Prescott Gum on 04/02/2016  Pathology: 1. Lung, biopsy, Right lower lobe - BENIGN LUNG WITH CHRONIC INFLAMMATION AND REACTIVE CHANGES. - NO MALIGNANCY. 2. Pleura, biopsy, Right - MIXED INFLAMMATION, FIBROSIS AND SURFACE FIBRIN. - NO MALIGNANCY. 3. Pleura, peel, Right - FIBRIN WITH SCATTERED INFLAMMATORY CELLS. - NO MALIGNANCY.  History of Presenting Illness: This is a 70 year-old Caucasian male nonsmoker with recurrent right pleural effusion requiring thoracentesis x2 in the past month. Over 1 L of fluid has been removed. Patient is symptomatic with chronic cough. Cytology has been negative. Cultures have been negative. The fluid is clear yellow. On 03/13/2016, Dr. Prescott Gum performed a right thoracentesis and removed 1 L of clear yellow fluid to help with his symptoms. Etiology of his effusion is probably cardiac-he has history of ischemic cardiomyopathy and was hospitalized almost 2 weeks for heart failure earlier this year. The patient is on chronic Coumadin therapy for a fib. He also has lower extremity edema and venous  stasis changes consistent with right heart failure.he did not have a left or right heart cath was hospitalized for heart failure.the patient has a pacemaker.  Patient is referred for evaluation and management of his right recurrent pleural effusion. He had a right Pleurx catheter placed by Dr. Prescott Gum on 03/15/2016. Because followup chest x-ray shows persistent probable loculated effusion,which is not in contact with the Pleurx catheter,  he will need a right VATS In order to fully evacuate the effusion because of its loculated nature. Potential risks, benefits, and complications of the surgery were discussed with the patient and he agreed to proceed with surgery. He was admitted on 04/02/2016 in order to undergo a right VATS, pleural biopsy, drain pleural effusion,and chemical pleurodesis with doxycycline.   Brief Hospital Course:  He has remained afebrile and hemodynamically stable. A line and foley were removed early in his post op course. Chest tube output gradually decreased. There was an air leak so chest tube remained for a few days post op. Daily chest x rays were obtained and remained stable. The air leak did resolve and chest tube was removed on 04/08/2016. Right pleur x catheter will be drained every other day (Monday, Wednesday, Friday) and output is to be recorded. He was restarted on Coumadin  (history of a fib) and his PT and INR were monitored daily. His last INR was 2.6. He is tolerating a diet and has had a bowel movement. He is ambulating on room air. CXR the morning of 06/27 showed increased right pleural effusion. Nurse drained right Pleur X catheter and got 400 cc out. Follow up CXR showed decrease in right pleural effusion and right space to be stable. Per Dr. Prescott Gum, he is surgically stable for  discharge today.  Latest Vital Signs: Blood pressure 104/59, pulse 71, temperature 97.6 F (36.4 C), temperature source Oral, resp. rate 20, height 5\' 9"  (1.753 m), weight 186 lb 7 oz  (84.567 kg), SpO2 98 %.  Physical Exam: Cardiovascular: Paced Pulmonary: Clear on the left and diminished on the right. Abdomen: Soft, non tender, bowel sounds present. Extremities: No lower extremity edema. Wounds: Clean and dry. Erythema from tape. Chest Tube: to water seal and there is an air leak with cough  Discharge Condition:Stable and discharged to home  Recent laboratory studies:  Lab Results  Component Value Date   WBC 13.1* 04/04/2016   HGB 14.1 04/04/2016   HCT 43.5 04/04/2016   MCV 94.4 04/04/2016   PLT 262 04/04/2016   Lab Results  Component Value Date   NA 132* 04/05/2016   K 4.0 04/05/2016   CL 94* 04/05/2016   CO2 29 04/05/2016   CREATININE 0.88 04/05/2016   GLUCOSE 98 04/05/2016   Diagnostic Studies:  EXAM: CHEST 2 VIEW  COMPARISON: PA and lateral chest x-ray of earlier today and portable chest x-ray of April 08, 2016  FINDINGS: There remains a right-sided hydro pneumothorax the pleural fluid volume has decreased since the 7:12 a.m. study today. The pneumothorax which lies laterally and inferiorly is relatively stable. The permanent pacemaker is in stable position.  IMPRESSION: Interval decrease in the volume of the right pleural effusion. The right pneumothorax is stable.   Electronically Signed  By: Benjamin Mckay M.D.  On: 04/09/2016 12:02   Discharge Medications:   Medication List    ASK your doctor about these medications        acetaminophen 500 MG tablet  Commonly known as:  TYLENOL  Take 500 mg by mouth every 6 (six) hours as needed (For pain.).     chlorpheniramine-HYDROcodone 10-8 MG/5ML Suer  Commonly known as:  TUSSIONEX  Take 5 mLs by mouth at bedtime as needed for cough.     DUREZOL 0.05 % Emul  Generic drug:  Difluprednate  Apply 1 drop to eye 2 (two) times daily.     FLOMAX 0.4 MG Caps capsule  Generic drug:  tamsulosin  Take 0.4 mg by mouth at bedtime.     furosemide 20 MG tablet  Commonly known as:   LASIX  TAKE 20 MG 4 TIMES WEEKLY     hydrocortisone cream 1 %  Apply 1 application topically 2 (two) times daily as needed for itching.     methimazole 10 MG tablet  Commonly known as:  TAPAZOLE  Take 60 mg by mouth daily.     metoprolol succinate 25 MG 24 hr tablet  Commonly known as:  TOPROL-XL  Take 1 tablet (25 mg total) by mouth daily.     pravastatin 80 MG tablet  Commonly known as:  PRAVACHOL  TAKE 1 TABLET BY MOUTH EVERY DAY     traMADol 50 MG tablet  Commonly known as:  ULTRAM  Take 50 mg by mouth 2 (two) times daily as needed (For pain.).     traZODone 50 MG tablet  Commonly known as:  DESYREL  Take 1 tablet (50 mg total) by mouth at bedtime as needed for sleep.     warfarin 2.5 MG tablet  Commonly known as:  COUMADIN  Take 1/2 -1 tablet by mouth daily as directed by coumadin clinic        Follow Up Appointments: Follow-up Information    Follow up with Len Childs, MD On  04/17/2016.   Specialty:  Cardiothoracic Surgery   Why:  PA/LAT CXR to be taken (at [facility] which is in the same building as Dr. Lucianne Lei Trigt's office) on 04/17/2016 at 4:00 pm;Appointment time is at 4:30 pm   Contact information:   [place] [address] [phone]       Follow up with Sanda Klein, MD.   Specialty:  Cardiology   Why:  Call to have a PT and INR (on Coumadin for a fib) drawn on Thursday 04/11/2016   Contact information:   [place] [address] [phone]       Follow up with [facility].   Why:  Please drain right Pleur X every Monday, Wednesday, Friday and record output      Signed: Marquavius Scaife MPA-C 04/08/2016, 1:41 PM

## 2016-04-04 NOTE — Discharge Instructions (Signed)
Video Assisted Thoracoscopy Surgery, Care After Refer to this sheet in the next few weeks. These instructions provide you with information on caring for yourself after your procedure. Your caregiver may also give you more specific instructions. Your procedure has been planned according to current medical practices, but problems sometimes occur. Call your caregiver if you have any problems or questions after your procedure. HOME CARE INSTRUCTIONS   Only take over-the-counter or prescription medications as directed.  Only take pain medications (narcotics) as directed.  Do not drive until your caregiver approves. Driving while taking narcotics or soon after surgery can be dangerous, so discuss the specific timing with your caregiver.  Avoid activities that use your chest muscles, such as lifting heavy objects, for at least 3-4 weeks.   Take deep breaths to expand the lungs and to protect against pneumonia.  Do breathing exercises as directed by your caregiver. If you were given an incentive spirometer to help with breathing, use it as directed.  You may resume a normal diet and activities when you feel you are able to or as directed.  Do not take a bath until your caregiver says it is OK. Use the shower instead.   Keep the bandage (dressing) covering the area where the chest tube was inserted (incision site) dry for 48 hours. After 48 hours, remove the dressing unless there is new drainage.  Remove dressings as directed by your caregiver.  Change dressings if necessary or as directed.  Keep all follow-up appointments. It is important for you to see your caregiver after surgery to discuss appropriate follow-up care and surveillance, if it is necessary. SEEK MEDICAL CARE:  You feel excessive or increasing pain at an incision site.  You notice bleeding, skin irritation, drainage, swelling, or redness at an incision site.  There is a bad smell coming from an incision or dressing.  It  feels like your heart is fluttering or beating rapidly.  Your pain medication does not relieve your pain. SEEK IMMEDIATE MEDICAL CARE IF:   You have a fever.   You have chest pain.  You have a rash.  You have shortness of breath.  You have trouble breathing.   You feel weak, lightheaded, dizzy, or faint.  MAKE SURE YOU:   Understand these instructions.   Will watch your condition.   Will get help right away if you are not doing well or get worse.   This information is not intended to replace advice given to you by your health care provider. Make sure you discuss any questions you have with your health care provider.   Document Released: 01/25/2013 Document Revised: 10/21/2014 Document Reviewed: 01/25/2013 Elsevier Interactive Patient Education [employer].

## 2016-04-04 NOTE — Anesthesia Postprocedure Evaluation (Signed)
Anesthesia Post Note  Patient: Benjamin Mckay [facility]  Procedure(s) Performed: Procedure(s) (LRB): Right VIDEO ASSISTED THORACOSCOPY with Biopsies and drainage pleural effusion (Right) PLEURADESIS (N/A)  Patient location during evaluation: PACU Anesthesia Type: General Level of consciousness: awake and alert Pain management: pain level controlled Vital Signs Assessment: post-procedure vital signs reviewed and stable Respiratory status: spontaneous breathing, nonlabored ventilation, respiratory function stable and patient connected to nasal cannula oxygen Cardiovascular status: blood pressure returned to baseline and stable Postop Assessment: no signs of nausea or vomiting Anesthetic complications: no    Last Vitals:  Filed Vitals:   04/04/16 0410 04/04/16 0741  BP: 106/61 105/62  Pulse: 69 66  Temp: 37 C 36.5 C  Resp: 16 16    Last Pain:  Filed Vitals:   04/04/16 0816  PainSc: 6                  Zenaida Deed

## 2016-04-04 NOTE — Progress Notes (Signed)
Patient with decreased urinary output, amber concentrated color. Minimal intake, IVF KVO. PA called. Josie Saunders returned call. New orders placed. Will give and cont to monitor.

## 2016-04-04 NOTE — Progress Notes (Addendum)
      [address]       ,[place] [address]             [phone]       2 Days Post-Op Procedure(s) (LRB): Right VIDEO ASSISTED THORACOSCOPY with Biopsies and drainage pleural effusion (Right) PLEURADESIS (N/A)  Subjective: Patient with no specific complaints  Pathology on pleural peel and lung biopsy negative for malignancy Patient with significant titling of Pleur-evac air fluid level Chest x-ray shows slow improvement in aeration of right lower lobe with deep breathing exercises Leave chest tube to suction a day Will probably clamp chest tube in a.m. for several hours and follow-up with chest x-ray before removing Pleurx catheter remains in place to manage recurrent effusion Resuming Coumadin for chronic A. fib, home dose 2.5 mg  Objective: Vital signs in last 24 hours: Temp:  [97.7 F (36.5 C)-98.6 F (37 C)] 97.7 F (36.5 C) (06/22 0741) Pulse Rate:  [65-73] 66 (06/22 0741) Cardiac Rhythm:  [-] Ventricular paced (06/22 0741) Resp:  [9-27] 16 (06/22 0741) BP: (94-117)/(53-66) 105/62 mmHg (06/22 0741) SpO2:  [90 %-99 %] 97 % (06/22 0814) Arterial Line BP: (142-151)/(75-78) 142/75 mmHg (06/21 1100)      Intake/Output from previous day: 06/21 0701 - 06/22 0700 In: 1432.2 [P.O.:480; I.V.:952.2] Out: 1420 [Urine:900; Chest Tube:520]   Physical Exam:  Cardiovascular: Paced Pulmonary: Clear on the left and diminished on the right. Abdomen: Soft, non tender, bowel sounds present. Extremities: No lower extremity edema. Wounds: Clean and dry.  Erythema from tape. Chest Tube: to water seal and there is an air leak with cough  Lab Results: CBC: Recent Labs  04/03/16 0430 04/04/16 0520  WBC 11.3* 13.1*  HGB 13.7 14.1  HCT 41.6 43.5  PLT 248 262   BMET:  Recent Labs  04/03/16 0430 04/04/16 0520  NA 134* 132*  K 3.8 4.4  CL 100* 95*  CO2 28 30  GLUCOSE 106* 80  BUN 8 7  CREATININE 0.79 0.99  CALCIUM 7.7* 8.1*    PT/INR:  Recent  Labs  04/01/16 1329  LABPROT 16.4*  INR 1.31   ABG:  INR: Will add last result for INR, ABG once components are confirmed Will add last 4 CBG results once components are confirmed  Assessment/Plan:  1. CV - Paced. On Toprol XL 25 mg daily and Coumadin (for a fib). No INR today so will order. 2.  Pulmonary - On room air . Chest tube is to water seal. Chest tube output with 520 cc.There  A + 3 air leak with cough. Hope to remove chest tube once air leak resolves and start drainage of the pleur X. CXR appears stable (entrapment of the RLL), left base atelectasis, and cardiomegaly. Encourage incentive spirometer and flutter valve. Check CXR in am  ZIMMERMAN,DONIELLE MPA-C 04/04/2016,8:28 AM   If patient has question of air leak tomorrow we'll clamp tube for several hours then obtain follow-up chest x-ray before removing tube patient examined and medical record reviewed,agree with above note. Tharon Aquas Trigt III 04/04/2016

## 2016-04-05 ENCOUNTER — Inpatient Hospital Stay (HOSPITAL_COMMUNITY): Payer: PPO

## 2016-04-05 LAB — BASIC METABOLIC PANEL
Anion gap: 9 (ref 5–15)
BUN: 7 mg/dL (ref 6–20)
CHLORIDE: 94 mmol/L — AB (ref 101–111)
CO2: 29 mmol/L (ref 22–32)
CREATININE: 0.88 mg/dL (ref 0.61–1.24)
Calcium: 8 mg/dL — ABNORMAL LOW (ref 8.9–10.3)
GFR calc non Af Amer: >60 mL/min (ref >60)
Glucose, Bld: 98 mg/dL (ref 65–99)
Potassium: 4 mmol/L (ref 3.5–5.1)
Sodium: 132 mmol/L — ABNORMAL LOW (ref 135–145)

## 2016-04-05 LAB — PROTIME-INR
INR: 1.61 — ABNORMAL HIGH (ref 0.00–1.49)
Prothrombin Time: 19.1 seconds — ABNORMAL HIGH (ref 11.6–15.2)

## 2016-04-05 MED ORDER — SORBITOL 70 % PO SOLN
60.0000 mL | Freq: Once | ORAL | Status: AC
  Administered 2016-04-05: 60 mL via ORAL
  Filled 2016-04-05: qty 60

## 2016-04-05 NOTE — Progress Notes (Addendum)
[address]       [place] [address]             [phone]      3 Days Post-Op Procedure(s) (LRB): Right VIDEO ASSISTED THORACOSCOPY with Biopsies and drainage pleural effusion (Right) PLEURADESIS (N/A) Subjective: Breathing is comfortable  Objective: Vital signs in last 24 hours: Temp:  [97.5 F (36.4 C)-97.8 F (36.6 C)] 97.8 F (36.6 C) (06/23 0730) Pulse Rate:  [65-73] 71 (06/23 0730) Cardiac Rhythm:  [-] Ventricular paced (06/23 0800) Resp:  [12-29] 16 (06/23 0730) BP: (98-142)/(64-80) 117/64 mmHg (06/23 0730) SpO2:  [91 %-97 %] 97 % (06/23 0800)  Hemodynamic parameters for last 24 hours:    Intake/Output from previous day: 06/22 0701 - 06/23 0700 In: 1752 [P.O.:960; I.V.:292; IV Piggyback:500] Out: 1055 [Urine:735; Chest Tube:320] Intake/Output this shift: Total I/O In: 240 [P.O.:240] Out: 90 [Urine:50; Chest Tube:40]   PE: alert, NA Lungs dim right base Cor: RRR Abd: benign Incis: healing well    Lab Results:  Recent Labs  04/03/16 0430 04/04/16 0520  WBC 11.3* 13.1*  HGB 13.7 14.1  HCT 41.6 43.5  PLT 248 262   BMET:  Recent Labs  04/04/16 0520 04/05/16 0429  NA 132* 132*  K 4.4 4.0  CL 95* 94*  CO2 30 29  GLUCOSE 80 98  BUN 7 7  CREATININE 0.99 0.88  CALCIUM 8.1* 8.0*    PT/INR:  Recent Labs  04/05/16 0429  LABPROT 19.1*  INR 1.61*   ABG    Component Value Date/Time   PHART 7.397 04/03/2016 0430   HCO3 30.7* 04/03/2016 0430   TCO2 32.3 04/03/2016 0430   O2SAT 98.0 04/03/2016 0430   CBG (last 3)  No results for input(s): GLUCAP in the last 72 hours.  Meds Scheduled Meds: . acetaminophen  1,000 mg Oral Q6H   Or  . acetaminophen (TYLENOL) oral liquid 160 mg/5 mL  1,000 mg Oral Q6H  . bisacodyl  10 mg Oral Daily  . fentaNYL   Intravenous Q4H  . furosemide  20 mg Oral Daily  . levalbuterol  0.63 mg Nebulization BID  . methimazole  60 mg Oral Daily  . metoprolol succinate  25 mg Oral Daily   . pravastatin  80 mg Oral QPM  . senna-docusate  1 tablet Oral QHS  . tamsulosin  0.4 mg Oral QHS  . warfarin  1.25 mg Oral Once per day on Sun Tue Thu  . warfarin  2.5 mg Oral Once per day on Mon Wed Fri Sat  . Warfarin - Physician Dosing Inpatient   Does not apply q1800   Continuous Infusions: . 0.45 % NaCl with KCl 20 mEq / L 10 mL/hr at 04/04/16 1718   PRN Meds:.albumin human, chlorpheniramine-HYDROcodone, diphenhydrAMINE **OR** diphenhydrAMINE, hydrocortisone cream, naloxone **AND** sodium chloride flush, ondansetron (ZOFRAN) IV, ondansetron (ZOFRAN) IV, oxyCODONE, potassium chloride, traMADol, traZODone  Xrays Dg Chest Port 1 View  04/05/2016  CLINICAL DATA:  Pneumothorax EXAM: PORTABLE CHEST 1 VIEW COMPARISON:  04/04/2016 FINDINGS: 0701 hours. Right chest tube remains in place with persistent stable right pneumothorax. Collapse/ consolidation in the lower right lung is stable. Left lung remains clear. The cardio pericardial silhouette is enlarged. Left-sided pacer remains in place. IMPRESSION: Stable exam. Electronically Signed   By: Misty Stanley M.D.   On: 04/05/2016 08:18   Dg Chest Port 1 View  04/04/2016  CLINICAL DATA:  Follow-up right pneumothorax with chest tube treatment. EXAM: PORTABLE  CHEST 1 VIEW COMPARISON:  Portable chest x-rays of June twentieth and April 03, 2016. FINDINGS: There remains a moderate-sized right basilar and lateral pneumothorax. A small amount of pleural fluid is present. The chest tube is in stable position. Parenchymal consolidation of the collapsed portion of the right lower lung is stable. There is no mediastinal shift. The left lung exhibits minimal basilar atelectasis. The heart remains enlarged. The pulmonary vascularity is not engorged. The permanent pacemaker is in stable position. There is gaseous distention of the stomach or bowel below the left hemidiaphragm. IMPRESSION: Persistent right-sided pneumothorax with parenchymal consolidation in the right  perihilar region. Developing the left lower lobe atelectasis. Stable cardiomegaly without pulmonary edema. Electronically Signed   By: David  Martinique M.D.   On: 04/04/2016 08:03    Assessment/Plan: S/P Procedure(s) (LRB): Right VIDEO ASSISTED THORACOSCOPY with Biopsies and drainage pleural effusion (Right) PLEURADESIS (N/A)  1 Chest tube is currently clamped  2 am CXR was unchanged- new exam scheduled for 14:00    LOS: 3 days    GOLD,WAYNE E 04/05/2016  The chest tube was clamped for 7 hours Chest x-ray after tube was clamped showed no significant change However after releasing the clamp there was clearly a significant amount of air which drained out of the pleural space Chest tube to remain at waterseal until air leak is resolved Patient complaining of constipation abdominal pain and sorbitol has been ordered  patient examined and medical record reviewed,agree with above note. Tharon Aquas Trigt III 04/05/2016

## 2016-04-05 NOTE — Care Management Important Message (Signed)
Important Message  Patient Details  Name: Benjamin Mckay MRN: [id number] Date of Birth: 15-Jul-1946   Medicare Important Message Given:  Yes    [name] May 04/05/2016, 10:36 AM

## 2016-04-06 ENCOUNTER — Inpatient Hospital Stay (HOSPITAL_COMMUNITY): Payer: PPO

## 2016-04-06 LAB — PROTIME-INR
INR: 2.01 — AB (ref 0.00–1.49)
PROTHROMBIN TIME: 22.7 s — AB (ref 11.6–15.2)

## 2016-04-06 LAB — BODY FLUID CULTURE: Culture: NO GROWTH

## 2016-04-06 MED ORDER — MAGNESIUM CITRATE PO SOLN
300.0000 mL | Freq: Once | ORAL | Status: AC
  Administered 2016-04-06: 300 mL via ORAL
  Filled 2016-04-06: qty 592

## 2016-04-06 NOTE — Progress Notes (Addendum)
      [address]       [place] [address]             [phone]       4 Days Post-Op Procedure(s) (LRB): Right VIDEO ASSISTED THORACOSCOPY with Biopsies and drainage pleural effusion (Right) PLEURADESIS (N/A)  Subjective: Patient with nausea related to constipation. He is receiving a breathing treatment this am.  Objective: Vital signs in last 24 hours: Temp:  [97.5 F (36.4 C)-98.5 F (36.9 C)] 98.1 F (36.7 C) (06/24 0319) Pulse Rate:  [67-142] 72 (06/24 0319) Cardiac Rhythm:  [-] Ventricular paced (06/24 0833) Resp:  [14-25] 16 (06/24 0800) BP: (111-122)/(60-79) 113/64 mmHg (06/24 0319) SpO2:  [85 %-100 %] 100 % (06/24 0800)     Intake/Output from previous day: 06/23 0701 - 06/24 0700 In: 980 [P.O.:720; I.V.:260] Out: 1740 [Urine:1400; Chest Tube:340]   Physical Exam:  Cardiovascular: Paced Pulmonary: Clear on the left and slightly diminished on the right. Abdomen: Soft, non tender, bowel sounds present. Extremities: No lower extremity edema. Wounds: Clean and dry.  Erythema from tape. Chest Tube: to water seal and there is an air leak with cough  Lab Results: CBC:  Recent Labs  04/04/16 0520  WBC 13.1*  HGB 14.1  HCT 43.5  PLT 262   BMET:   Recent Labs  04/04/16 0520 04/05/16 0429  NA 132* 132*  K 4.4 4.0  CL 95* 94*  CO2 30 29  GLUCOSE 80 98  BUN 7 7  CREATININE 0.99 0.88  CALCIUM 8.1* 8.0*    PT/INR:   Recent Labs  04/06/16 0420  LABPROT 22.7*  INR 2.01*   ABG:  INR: Will add last result for INR, ABG once components are confirmed Will add last 4 CBG results once components are confirmed  Assessment/Plan:  1. CV - Paced. On Toprol XL 25 mg daily and Coumadin (for a fib). INR up from 1.61 to 2.01 2.  Pulmonary - On room air . Chest tube was clamped yesterday but not removed as had a significant amount of air into pleural space after clamp removed.  CXR this am appears stable (entrapment of the RLL), left  base atelectasis, and cardiomegaly. Encourage incentive spirometer and flutter valve. Check CXR in am 3. Constipation despite Lactulose-will give Mag Cit  ZIMMERMAN,DONIELLE MPA-C 04/06/2016,9:21 AM  Still with air leak, leave ct  I have seen and examined Benjamin Mckay and agree with the above assessment  and plan.  Grace Isaac MD Beeper [phone] Office [phone] 04/06/2016 10:22 AM

## 2016-04-07 ENCOUNTER — Inpatient Hospital Stay (HOSPITAL_COMMUNITY): Payer: PPO

## 2016-04-07 LAB — AEROBIC/ANAEROBIC CULTURE W GRAM STAIN (SURGICAL/DEEP WOUND): Culture: NO GROWTH

## 2016-04-07 LAB — PROTIME-INR
INR: 2.36 — AB (ref 0.00–1.49)
PROTHROMBIN TIME: 25.5 s — AB (ref 11.6–15.2)

## 2016-04-07 MED ORDER — LEVALBUTEROL HCL 0.63 MG/3ML IN NEBU: 0.6300 mg | INHALATION_SOLUTION | Freq: Four times a day (QID) | RESPIRATORY_TRACT | Status: DC | PRN

## 2016-04-07 NOTE — Progress Notes (Addendum)
      [address]       [place] [address]             [phone]       5 Days Post-Op Procedure(s) (LRB): Right VIDEO ASSISTED THORACOSCOPY with Biopsies and drainage pleural effusion (Right) PLEURADESIS (N/A)  Subjective: Patient had large bowel movement and feels much better.  Objective: Vital signs in last 24 hours: Temp:  [97.4 F (36.3 C)-98.3 F (36.8 C)] 97.4 F (36.3 C) (06/25 0740) Pulse Rate:  [65-95] 72 (06/25 0740) Cardiac Rhythm:  [-] Ventricular paced (06/25 0749) Resp:  [16-37] 16 (06/25 0756) BP: (92-130)/(56-68) 114/56 mmHg (06/25 0740) SpO2:  [95 %-100 %] 97 % (06/25 0756)     Intake/Output from previous day: 06/24 0701 - 06/25 0700 In: 600 [P.O.:600] Out: 1052 [Urine:901; Stool:1; Chest Tube:150]   Physical Exam:  Cardiovascular: Paced Pulmonary: Clear on the left and slightly diminished on the right. Abdomen: Soft, non tender, bowel sounds present. Extremities: No lower extremity edema. Wounds: Clean and dry.  Erythema from tape. Chest Tube: to water seal and there is a smaller  air leak with cough  Lab Results: CBC: No results for input(s): WBC, HGB, HCT, PLT in the last 72 hours. BMET:   Recent Labs  04/05/16 0429  NA 132*  K 4.0  CL 94*  CO2 29  GLUCOSE 98  BUN 7  CREATININE 0.88  CALCIUM 8.0*    PT/INR:   Recent Labs  04/07/16 0504  LABPROT 25.5*  INR 2.36*   ABG:  INR: Will add last result for INR, ABG once components are confirmed Will add last 4 CBG results once components are confirmed  Assessment/Plan:  1. CV - Paced. On Toprol XL 25 mg daily and Coumadin (for a fib). INR up from 2.01 to 2.36.  2.  Pulmonary - On room air . Chest tube is to water seal and there is an air leak. CXR this am appears stable (entrapment of the RLL), left base atelectasis, and cardiomegaly. Encourage incentive spirometer and flutter valve. Check CXR in am   ZIMMERMAN,DONIELLE MPA-C 04/07/2016,9:06 AM   I  have seen and examined Benjamin Mckay and agree with the above assessment  and plan.  Grace Isaac MD Beeper [phone] Office [phone] 04/07/2016 10:48 AM

## 2016-04-07 NOTE — Progress Notes (Signed)
Wife assisting patient with bath.  Wife requested I look at the pleurex drain dressing on the right upper abdomen.  Along the medial margin of the tegaderm, 2 intact bulla had developed along with slight erythema.  Drain dressing removed, bulla remained intact.  Betadine applied to drain insertion site (area without redness, drainage, or erythema), as well as bulla.  Site allowed to air dry.  Split drain sponge folded around drain insertion site and covered with Allevyn foam dressing.  Separate Allevyn foam dressing applied to area with bulla.  Patient and wife expressed appreciation for care provided.

## 2016-04-08 ENCOUNTER — Inpatient Hospital Stay (HOSPITAL_COMMUNITY): Payer: PPO

## 2016-04-08 LAB — PROTIME-INR
INR: 2.8 — AB (ref 0.00–1.49)
PROTHROMBIN TIME: 29.1 s — AB (ref 11.6–15.2)

## 2016-04-08 NOTE — Progress Notes (Addendum)
      [address]       [place] [address]             [phone]       6 Days Post-Op Procedure(s) (LRB): Right VIDEO ASSISTED THORACOSCOPY with Biopsies and drainage pleural effusion (Right) PLEURADESIS (N/A)  Subjective: Patient without complaints this am.  Objective: Vital signs in last 24 hours: Temp:  [97.4 F (36.3 C)-97.8 F (36.6 C)] 97.6 F (36.4 C) (06/26 1100) Pulse Rate:  [65-87] 71 (06/26 1100) Cardiac Rhythm:  [-] Ventricular paced;Bundle branch block (06/26 0804) Resp:  [11-21] 20 (06/26 1100) BP: (98-132)/(49-64) 104/59 mmHg (06/26 1100) SpO2:  [96 %-100 %] 98 % (06/26 1100)     Intake/Output from previous day: 06/25 0701 - 06/26 0700 In: 840 [P.O.:840] Out: 1530 [Urine:1300; Chest Tube:230]   Physical Exam:  Cardiovascular: Paced Pulmonary: Clear on the left and diminished on the right. Abdomen: Soft, non tender, bowel sounds present. Extremities: No lower extremity edema. Wounds: Clean and dry.  Erythema from tape.   Lab Results: CBC: No results for input(s): WBC, HGB, HCT, PLT in the last 72 hours. BMET:  No results for input(s): NA, K, CL, CO2, GLUCOSE, BUN, CREATININE, CALCIUM in the last 72 hours.  PT/INR:   Recent Labs  04/08/16 0444  LABPROT 29.1*  INR 2.80*   ABG:  INR: Will add last result for INR, ABG once components are confirmed Will add last 4 CBG results once components are confirmed  Assessment/Plan:  1. CV - Paced. On Toprol XL 25 mg daily and Coumadin (for a fib). INR up from 2.36 to 2.8.  2.  Pulmonary - On room air . Chest tube has been removed. CXR this am appears stable (entrapment of the RLL), left base atelectasis, and cardiomegaly. Encourage incentive spirometer and flutter valve. Check CXR in am Per Dr. Prescott Gum, right Pleur X to be drained every Mon/Wed/Fri.  ZIMMERMAN,DONIELLE MPA-C 04/08/2016,1:34 PM  home in am if CXR stable Resume M W F pleurx drain sched at home patient  examined and medical record reviewed,agree with above note. Tharon Aquas Trigt III 04/08/2016

## 2016-04-08 NOTE — Care Management Important Message (Signed)
Important Message  Patient Details  Name: Benjamin Mckay MRN: [id number] Date of Birth: 04-21-1946   Medicare Important Message Given:  Yes    [name] May 04/08/2016, 10:42 AM

## 2016-04-08 NOTE — Care Management Note (Signed)
Case Management Note  Patient Details  Name: Benjamin Mckay MRN: [id number] Date of Birth: 13-Aug-1946  Subjective/Objective:      Right VATs              Action/Plan: Discharge Planning:  NCM spoke to pt at bedside. States he is active with [facility] for [facility] RN. They orders his supplies also for pleurx cath. Wife, Sherrill at home to assist with care. States wife has been trained to help manage drain. Contacted [facility] for resumption of care for [facility] RN with possible dc tomorrow.    Expected Discharge Date:  04/09/2016              Expected Discharge Plan:  [place]  In-House Referral:  NA  Discharge planning Services  CM Consult  Post Acute Care Choice:  Home Health, Resumption of Svcs/PTA Provider Choice offered to:  Patient  DME Arranged:  N/A (has RW at home) DME Agency:  NA  HH Arranged:  RN [place] Agency:  [place]  Status of Service:  Completed, signed off  If discussed at [place] of Stay Meetings, dates discussed:    Additional Comments:  Erenest Rasher, RN 04/08/2016, 3:42 PM

## 2016-04-09 ENCOUNTER — Inpatient Hospital Stay (HOSPITAL_COMMUNITY): Payer: PPO

## 2016-04-09 LAB — PROTIME-INR
INR: 2.6 — ABNORMAL HIGH (ref 0.00–1.49)
PROTHROMBIN TIME: 27.5 s — AB (ref 11.6–15.2)

## 2016-04-09 MED ORDER — FUROSEMIDE 20 MG PO TABS: 20.0000 mg | ORAL_TABLET | Freq: Every day | ORAL | Status: DC

## 2016-04-09 MED ORDER — TRAMADOL HCL 50 MG PO TABS: 50.0000 mg | ORAL_TABLET | Freq: Four times a day (QID) | ORAL | Status: DC | PRN

## 2016-04-09 NOTE — Progress Notes (Signed)
Pleurix catheter drained per order. Sterile technique maintained during procedure. 400 cc of serosanguinous fluid removed.Patient tolerated procedure well.  Patient then taken to radiology for post procedure xray.  Will continue to monitor.

## 2016-04-09 NOTE — Progress Notes (Addendum)
      [address]       [place] [address]             [phone]       7 Days Post-Op Procedure(s) (LRB): Right VIDEO ASSISTED THORACOSCOPY with Biopsies and drainage pleural effusion (Right) PLEURADESIS (N/A)  Subjective: Patient without complaints this am.  Objective: Vital signs in last 24 hours: Temp:  [97.6 F (36.4 C)-98.2 F (36.8 C)] 97.9 F (36.6 C) (06/27 0752) Pulse Rate:  [64-86] 64 (06/27 0752) Cardiac Rhythm:  [-] Ventricular paced;Bundle branch block (06/26 1938) Resp:  [13-22] 13 (06/27 0752) BP: (96-104)/(54-59) 104/54 mmHg (06/27 0752) SpO2:  [91 %-99 %] 98 % (06/27 0752)     Intake/Output from previous day: 06/26 0701 - 06/27 0700 In: 480 [P.O.:480] Out: 950 [Urine:850; Chest Tube:100]   Physical Exam:  Cardiovascular: Paced Pulmonary: Clear on the left and very diminished on the right base Abdomen: Soft, non tender, bowel sounds present. Extremities: No lower extremity edema. Wounds: Clean and dry.  Erythema from tape.   Lab Results: CBC: No results for input(s): WBC, HGB, HCT, PLT in the last 72 hours. BMET:  No results for input(s): NA, K, CL, CO2, GLUCOSE, BUN, CREATININE, CALCIUM in the last 72 hours.  PT/INR:   Recent Labs  04/09/16 0436  LABPROT 27.5*  INR 2.60*   ABG:  INR: Will add last result for INR, ABG once components are confirmed Will add last 4 CBG results once components are confirmed  Assessment/Plan:  1. CV - Paced. On Toprol XL 25 mg daily and Coumadin (for a fib). INR slightly decreased from 2.8 to 2.6.  2.  Pulmonary - On room air . Chest tube has been removed. CXR this am shows increased right pleural effusion and left lung is clear. Encourage incentive spirometer and flutter valve.  Per Dr. Prescott Gum, right Pleur X to be drained every Mon/Wed/Fri. 3. Will check CXR after drainage of Pleur X. Possibly, home later today.  Benjamin Mckay MPA-C 04/09/2016,8:04 AM

## 2016-04-09 NOTE — Progress Notes (Signed)
Pt given discharge packet, education given about medication regimen and follow up appointments. Pt and spouse state no further questions at this time. Patient discharged with supply of pleurix bottles.  Nurse case manager notified of patients discharge, home with home health RN. Will continue to monitor.

## 2016-04-10 ENCOUNTER — Ambulatory Visit (INDEPENDENT_AMBULATORY_CARE_PROVIDER_SITE_OTHER): Payer: PPO | Admitting: Pharmacist

## 2016-04-10 DIAGNOSIS — Z7901 Long term (current) use of anticoagulants: Secondary | ICD-10-CM | POA: Diagnosis not present

## 2016-04-10 DIAGNOSIS — I4891 Unspecified atrial fibrillation: Secondary | ICD-10-CM

## 2016-04-10 DIAGNOSIS — I482 Chronic atrial fibrillation, unspecified: Secondary | ICD-10-CM

## 2016-04-10 DIAGNOSIS — I4821 Permanent atrial fibrillation: Secondary | ICD-10-CM

## 2016-04-10 LAB — POCT INR: INR: 2.7

## 2016-04-12 DIAGNOSIS — I110 Hypertensive heart disease with heart failure: Secondary | ICD-10-CM | POA: Diagnosis not present

## 2016-04-12 DIAGNOSIS — I5042 Chronic combined systolic (congestive) and diastolic (congestive) heart failure: Secondary | ICD-10-CM | POA: Diagnosis not present

## 2016-04-12 DIAGNOSIS — Z7901 Long term (current) use of anticoagulants: Secondary | ICD-10-CM | POA: Diagnosis not present

## 2016-04-12 DIAGNOSIS — J90 Pleural effusion, not elsewhere classified: Secondary | ICD-10-CM | POA: Diagnosis not present

## 2016-04-12 DIAGNOSIS — I4891 Unspecified atrial fibrillation: Secondary | ICD-10-CM | POA: Diagnosis not present

## 2016-04-12 DIAGNOSIS — E785 Hyperlipidemia, unspecified: Secondary | ICD-10-CM | POA: Diagnosis not present

## 2016-04-12 DIAGNOSIS — Z4682 Encounter for fitting and adjustment of non-vascular catheter: Secondary | ICD-10-CM | POA: Diagnosis not present

## 2016-04-12 DIAGNOSIS — E039 Hypothyroidism, unspecified: Secondary | ICD-10-CM | POA: Diagnosis not present

## 2016-04-15 DIAGNOSIS — J90 Pleural effusion, not elsewhere classified: Secondary | ICD-10-CM | POA: Diagnosis not present

## 2016-04-15 DIAGNOSIS — E785 Hyperlipidemia, unspecified: Secondary | ICD-10-CM | POA: Diagnosis not present

## 2016-04-15 DIAGNOSIS — Z4682 Encounter for fitting and adjustment of non-vascular catheter: Secondary | ICD-10-CM | POA: Diagnosis not present

## 2016-04-15 DIAGNOSIS — I110 Hypertensive heart disease with heart failure: Secondary | ICD-10-CM | POA: Diagnosis not present

## 2016-04-15 DIAGNOSIS — E039 Hypothyroidism, unspecified: Secondary | ICD-10-CM | POA: Diagnosis not present

## 2016-04-15 DIAGNOSIS — Z7901 Long term (current) use of anticoagulants: Secondary | ICD-10-CM | POA: Diagnosis not present

## 2016-04-15 DIAGNOSIS — I4891 Unspecified atrial fibrillation: Secondary | ICD-10-CM | POA: Diagnosis not present

## 2016-04-15 DIAGNOSIS — I5042 Chronic combined systolic (congestive) and diastolic (congestive) heart failure: Secondary | ICD-10-CM | POA: Diagnosis not present

## 2016-04-17 ENCOUNTER — Other Ambulatory Visit: Payer: Self-pay | Admitting: Cardiothoracic Surgery

## 2016-04-17 ENCOUNTER — Ambulatory Visit: Payer: Self-pay | Admitting: Cardiothoracic Surgery

## 2016-04-17 DIAGNOSIS — E785 Hyperlipidemia, unspecified: Secondary | ICD-10-CM | POA: Diagnosis not present

## 2016-04-17 DIAGNOSIS — I5042 Chronic combined systolic (congestive) and diastolic (congestive) heart failure: Secondary | ICD-10-CM | POA: Diagnosis not present

## 2016-04-17 DIAGNOSIS — E039 Hypothyroidism, unspecified: Secondary | ICD-10-CM | POA: Diagnosis not present

## 2016-04-17 DIAGNOSIS — J90 Pleural effusion, not elsewhere classified: Secondary | ICD-10-CM | POA: Diagnosis not present

## 2016-04-17 DIAGNOSIS — Z4682 Encounter for fitting and adjustment of non-vascular catheter: Secondary | ICD-10-CM | POA: Diagnosis not present

## 2016-04-17 DIAGNOSIS — I110 Hypertensive heart disease with heart failure: Secondary | ICD-10-CM | POA: Diagnosis not present

## 2016-04-17 DIAGNOSIS — I4891 Unspecified atrial fibrillation: Secondary | ICD-10-CM | POA: Diagnosis not present

## 2016-04-17 DIAGNOSIS — Z7901 Long term (current) use of anticoagulants: Secondary | ICD-10-CM | POA: Diagnosis not present

## 2016-04-18 ENCOUNTER — Ambulatory Visit
Admission: RE | Admit: 2016-04-18 | Discharge: 2016-04-18 | Disposition: A | Discharge status: Home / Self Care | Payer: PPO | Source: Ambulatory Visit | Attending: Cardiothoracic Surgery | Admitting: Cardiothoracic Surgery

## 2016-04-18 ENCOUNTER — Encounter: Payer: Self-pay | Admitting: Cardiothoracic Surgery

## 2016-04-18 ENCOUNTER — Ambulatory Visit (INDEPENDENT_AMBULATORY_CARE_PROVIDER_SITE_OTHER): Payer: PPO | Admitting: Cardiothoracic Surgery

## 2016-04-18 VITALS — BP 100/65 | HR 73 | Resp 20 | Ht 69.0 in | Wt 186.0 lb

## 2016-04-18 DIAGNOSIS — J90 Pleural effusion, not elsewhere classified: Secondary | ICD-10-CM

## 2016-04-18 DIAGNOSIS — Z09 Encounter for follow-up examination after completed treatment for conditions other than malignant neoplasm: Secondary | ICD-10-CM

## 2016-04-18 DIAGNOSIS — J948 Other specified pleural conditions: Secondary | ICD-10-CM

## 2016-04-18 NOTE — Progress Notes (Signed)
PCP is No PCP Per Patient Referring Provider is Juanito Doom, MD  Chief Complaint  Patient presents with  . Routine Post Op    f/u from surgery with CXR, s/p Rt VATS, drainage of pleural effusion,  Pleural biopsy 04/02/16    HPI: Postop visit after right VATS, drainage of loculated effusion, chemical pleurodesis for recurrent malignant effusion. Pathology and cytology on pleural peel and pleural fluid all negative Patient's persistent cough she had preoperatively is now resolved Pleurx drainage Monday was a Friday removes 300-400cc per episode. Patient has chronic heart failure as etiology of the effusion. Chest x-ray shows residual space-pneumothorax from entrapped right lower lobe with some air-fluid levels. The expansion the right lower lobe is slightly less from the last x-ray prior to discharge. The patient states he is not taking his deeper breath due to incisional pain and has not been using the incentive spirometer.  Past Medical History  Diagnosis Date  . Atrial fibrillation ([place])   . Lymphoma ([place]) 1998    of colon   . Diverticulosis   . Esophageal stricture   . Hyperlipidemia   . Hypertension   . CVA (cerebral infarction)   . Colon polyps   . Urinary frequency   . Umbilical hernia   . Congenital heart block   . CHB (complete heart block) ([place]) 11/06/2013  . Cardiomyopathy, ischemic 11/07/2013  . Chronic combined systolic and diastolic CHF (congestive heart failure) ([place])   . Hyperthyroidism 11/16/2015  . Colon polyps   . Family history of adverse reaction to anesthesia   . Stroke [facility])     [id number] TIA  . Shortness of breath dyspnea     Pulmonary Effusion  . Constipation   . Presence of permanent cardiac pacemaker     St. Jude  pt.states he is totally dependent on pacemaker  . Anxiety   . Depression     Past Surgical History  Procedure Laterality Date  . Appendectomy  1953  . Colectomy      for lymphoma  . Pacemaker placement      replaced 3 x  .  Tonsillectomy    . Neck fusion    . Carotid doppler  11/04/2012    Proximal Rt ICA 50-99% diameter reduction; Lft Bulb demonstrated mild amount homogeneous plaque-not hemodynamically significant; Lft ICA-normal patency.  . Pacemaker generator change  01/31/2012    St Jude Med Accent DR RF model [id number] serial [id number]  . Cardiac catheterization  01/06/2003    Recommend medical therapy  . Cardiovascular stress test  07/16/2012    Mild-moderate perfusion defect seen in Basal inferior, Mid inferior, and Apica lateral consistent with infarct/scar. No scintigraphic evidence for inducible myocardial ischemia. No ECG changes. EKG negative for ischemia.  . Transthoracic echocardiogram  11/04/2012    EF 123456, systolic function moderately reduced, mild regurg of the aortic and mitral valves.  . Permanent pacemaker generator change N/A 01/31/2012    Procedure: PERMANENT PACEMAKER GENERATOR CHANGE;  Surgeon: Sanda Klein, MD;  Location: [place] CATH LAB;   . Colonoscopy    . Chest tube insertion Right 03/15/2016    Procedure: INSERTION PLEURAL DRAINAGE CATHETER;  Surgeon: Ivin Poot, MD;  Location: [place];  Service: Thoracic;  Laterality: Right;  . Video assisted thoracoscopy Right 04/02/2016    Procedure: Right VIDEO ASSISTED THORACOSCOPY with Biopsies and drainage pleural effusion;  Surgeon: Ivin Poot, MD;  Location: [place];  Service: Thoracic;  Laterality: Right;  . Pleuradesis N/A 04/02/2016  Procedure: PLEURADESIS;  Surgeon: Ivin Poot, MD;  Location: [facility] OR;  Service: Thoracic;  Laterality: N/A;    Family History  Problem Relation Age of Onset  . Prostate cancer Father   . Colon cancer Neg Hx   . Diabetes Maternal Grandmother   . Heart disease Mother   . Stroke Mother   . Heart attack Neg Hx     Social History Social History  Substance Use Topics  . Smoking status: Never Smoker   . Smokeless tobacco: Never Used  . Alcohol Use: No    Current Outpatient Prescriptions  Medication  Sig Dispense Refill  . acetaminophen (TYLENOL) 500 MG tablet Take 500 mg by mouth every 6 (six) hours as needed (For pain.).    Marland Kitchen chlorpheniramine-HYDROcodone (TUSSIONEX) 10-8 MG/5ML SUER Take 5 mLs by mouth at bedtime as needed for cough.   0  . furosemide (LASIX) 20 MG tablet Take 1 tablet (20 mg total) by mouth daily. 30 tablet 5  . hydrocortisone cream 1 % Apply 1 application topically 2 (two) times daily as needed for itching.     . methimazole (TAPAZOLE) 10 MG tablet Take 60 mg by mouth daily.    . metoprolol succinate (TOPROL-XL) 25 MG 24 hr tablet Take 1 tablet (25 mg total) by mouth daily. 30 tablet 11  . pravastatin (PRAVACHOL) 80 MG tablet TAKE 1 TABLET BY MOUTH EVERY DAY (Patient taking differently: TAKE 1 TABLET BY MOUTH EVERY DAY AT 8PM.) 90 tablet 3  . Tamsulosin HCl (FLOMAX) 0.4 MG CAPS Take 0.4 mg by mouth at bedtime.     . traMADol (ULTRAM) 50 MG tablet Take 1 tablet (50 mg total) by mouth every 6 (six) hours as needed (For pain.). 30 tablet 1  . traZODone (DESYREL) 50 MG tablet Take 1 tablet (50 mg total) by mouth at bedtime as needed for sleep. 30 tablet 2  . warfarin (COUMADIN) 2.5 MG tablet Take 1/2 -1 tablet by mouth daily as directed by coumadin clinic (Patient taking differently: Take 1.25-2.5 mg by mouth daily at 6 PM. Take half a tablet (1.25mg ) on Tuesday, Thursday, Sunday and one tablet (2.5mg ) the rest of the week.) 90 tablet 1   No current facility-administered medications for this visit.    Allergies  Allergen Reactions  . Statins Other (See Comments)    Leg cramps.   Tolerates pravastatin.    . Latex Other (See Comments)    Skin irritation  . Adhesive [Tape] Other (See Comments)    Skin irritation - please use paper tape  . Penicillins Rash and Other (See Comments)    Has patient had a PCN reaction causing immediate rash, facial/tongue/throat swelling, SOB or lightheadedness with hypotension: Yes Has patient had a PCN reaction causing severe rash involving  mucus membranes or skin necrosis: No Has patient had a PCN reaction that required hospitalization No Has patient had a PCN reaction occurring within the last 10 years: No If all of the above answers are "NO", then may proceed with Cephalosporin use.    Review of Systems   Patient has lost some weight since surgery due to poor appetite He is encouraged to increase his caloric intake especially protein as he is losing some protein with the Pleurx catheter drainage  BP 100/65 mmHg  Pulse 73  Resp 20  Ht 5\' 9"  (1.753 m)  Wt 186 lb (84.369 kg)  BMI 27.45 kg/m2  SpO2 98% Physical Exam Alert and comfortable not coughing Breath sounds diminished on the  right side ETS incision well-healed, chest tube suture removed Heart rate regular  Diagnostic Tests: Chest x-ray today personally reviewed showing persistent right pleural space and under inflation of the right lower lobe  Impression: Somatic improvement following procedure Pleurx catheter preventing reaccumulation of pleural effusion Patient was advised to use his incentive spirometer and to take deeper breaths in order to help reexpand the right lower lobe Plan: Continue Pleurx drainage Monday Wednesday Friday schedule Increase caloric intake especially protein Continue Lasix 20 mg daily Return for Pleurx catheter check with x-ray in 2-3 weeks  Len Childs, MD [facility] [phone]

## 2016-04-19 DIAGNOSIS — I5042 Chronic combined systolic (congestive) and diastolic (congestive) heart failure: Secondary | ICD-10-CM | POA: Diagnosis not present

## 2016-04-19 DIAGNOSIS — J90 Pleural effusion, not elsewhere classified: Secondary | ICD-10-CM | POA: Diagnosis not present

## 2016-04-19 DIAGNOSIS — E785 Hyperlipidemia, unspecified: Secondary | ICD-10-CM | POA: Diagnosis not present

## 2016-04-19 DIAGNOSIS — I4891 Unspecified atrial fibrillation: Secondary | ICD-10-CM | POA: Diagnosis not present

## 2016-04-19 DIAGNOSIS — Z4682 Encounter for fitting and adjustment of non-vascular catheter: Secondary | ICD-10-CM | POA: Diagnosis not present

## 2016-04-19 DIAGNOSIS — I110 Hypertensive heart disease with heart failure: Secondary | ICD-10-CM | POA: Diagnosis not present

## 2016-04-19 DIAGNOSIS — Z7901 Long term (current) use of anticoagulants: Secondary | ICD-10-CM | POA: Diagnosis not present

## 2016-04-19 DIAGNOSIS — E039 Hypothyroidism, unspecified: Secondary | ICD-10-CM | POA: Diagnosis not present

## 2016-04-22 DIAGNOSIS — I5042 Chronic combined systolic (congestive) and diastolic (congestive) heart failure: Secondary | ICD-10-CM | POA: Diagnosis not present

## 2016-04-22 DIAGNOSIS — J90 Pleural effusion, not elsewhere classified: Secondary | ICD-10-CM | POA: Diagnosis not present

## 2016-04-22 DIAGNOSIS — I4891 Unspecified atrial fibrillation: Secondary | ICD-10-CM | POA: Diagnosis not present

## 2016-04-22 DIAGNOSIS — E785 Hyperlipidemia, unspecified: Secondary | ICD-10-CM | POA: Diagnosis not present

## 2016-04-22 DIAGNOSIS — E039 Hypothyroidism, unspecified: Secondary | ICD-10-CM | POA: Diagnosis not present

## 2016-04-22 DIAGNOSIS — I110 Hypertensive heart disease with heart failure: Secondary | ICD-10-CM | POA: Diagnosis not present

## 2016-04-22 DIAGNOSIS — Z7901 Long term (current) use of anticoagulants: Secondary | ICD-10-CM | POA: Diagnosis not present

## 2016-04-22 DIAGNOSIS — Z4682 Encounter for fitting and adjustment of non-vascular catheter: Secondary | ICD-10-CM | POA: Diagnosis not present

## 2016-04-24 DIAGNOSIS — I110 Hypertensive heart disease with heart failure: Secondary | ICD-10-CM | POA: Diagnosis not present

## 2016-04-24 DIAGNOSIS — Z7901 Long term (current) use of anticoagulants: Secondary | ICD-10-CM | POA: Diagnosis not present

## 2016-04-24 DIAGNOSIS — E039 Hypothyroidism, unspecified: Secondary | ICD-10-CM | POA: Diagnosis not present

## 2016-04-24 DIAGNOSIS — I5042 Chronic combined systolic (congestive) and diastolic (congestive) heart failure: Secondary | ICD-10-CM | POA: Diagnosis not present

## 2016-04-24 DIAGNOSIS — E785 Hyperlipidemia, unspecified: Secondary | ICD-10-CM | POA: Diagnosis not present

## 2016-04-24 DIAGNOSIS — J90 Pleural effusion, not elsewhere classified: Secondary | ICD-10-CM | POA: Diagnosis not present

## 2016-04-24 DIAGNOSIS — I4891 Unspecified atrial fibrillation: Secondary | ICD-10-CM | POA: Diagnosis not present

## 2016-04-24 DIAGNOSIS — Z4682 Encounter for fitting and adjustment of non-vascular catheter: Secondary | ICD-10-CM | POA: Diagnosis not present

## 2016-04-24 DIAGNOSIS — Z48813 Encounter for surgical aftercare following surgery on the respiratory system: Secondary | ICD-10-CM | POA: Diagnosis not present

## 2016-04-26 DIAGNOSIS — Z7901 Long term (current) use of anticoagulants: Secondary | ICD-10-CM | POA: Diagnosis not present

## 2016-04-26 DIAGNOSIS — I4891 Unspecified atrial fibrillation: Secondary | ICD-10-CM | POA: Diagnosis not present

## 2016-04-26 DIAGNOSIS — E785 Hyperlipidemia, unspecified: Secondary | ICD-10-CM | POA: Diagnosis not present

## 2016-04-26 DIAGNOSIS — I5042 Chronic combined systolic (congestive) and diastolic (congestive) heart failure: Secondary | ICD-10-CM | POA: Diagnosis not present

## 2016-04-26 DIAGNOSIS — E039 Hypothyroidism, unspecified: Secondary | ICD-10-CM | POA: Diagnosis not present

## 2016-04-26 DIAGNOSIS — Z4682 Encounter for fitting and adjustment of non-vascular catheter: Secondary | ICD-10-CM | POA: Diagnosis not present

## 2016-04-26 DIAGNOSIS — I110 Hypertensive heart disease with heart failure: Secondary | ICD-10-CM | POA: Diagnosis not present

## 2016-04-26 DIAGNOSIS — J90 Pleural effusion, not elsewhere classified: Secondary | ICD-10-CM | POA: Diagnosis not present

## 2016-04-29 DIAGNOSIS — I110 Hypertensive heart disease with heart failure: Secondary | ICD-10-CM | POA: Diagnosis not present

## 2016-04-29 DIAGNOSIS — I5042 Chronic combined systolic (congestive) and diastolic (congestive) heart failure: Secondary | ICD-10-CM | POA: Diagnosis not present

## 2016-04-29 DIAGNOSIS — E039 Hypothyroidism, unspecified: Secondary | ICD-10-CM | POA: Diagnosis not present

## 2016-04-29 DIAGNOSIS — E785 Hyperlipidemia, unspecified: Secondary | ICD-10-CM | POA: Diagnosis not present

## 2016-04-29 DIAGNOSIS — Z4682 Encounter for fitting and adjustment of non-vascular catheter: Secondary | ICD-10-CM | POA: Diagnosis not present

## 2016-04-29 DIAGNOSIS — J90 Pleural effusion, not elsewhere classified: Secondary | ICD-10-CM | POA: Diagnosis not present

## 2016-04-29 DIAGNOSIS — I4891 Unspecified atrial fibrillation: Secondary | ICD-10-CM | POA: Diagnosis not present

## 2016-04-29 DIAGNOSIS — Z7901 Long term (current) use of anticoagulants: Secondary | ICD-10-CM | POA: Diagnosis not present

## 2016-05-01 ENCOUNTER — Ambulatory Visit (INDEPENDENT_AMBULATORY_CARE_PROVIDER_SITE_OTHER): Payer: PPO | Admitting: Pharmacist

## 2016-05-01 DIAGNOSIS — I4821 Permanent atrial fibrillation: Secondary | ICD-10-CM

## 2016-05-01 DIAGNOSIS — E785 Hyperlipidemia, unspecified: Secondary | ICD-10-CM | POA: Diagnosis not present

## 2016-05-01 DIAGNOSIS — I482 Chronic atrial fibrillation, unspecified: Secondary | ICD-10-CM

## 2016-05-01 DIAGNOSIS — Z7901 Long term (current) use of anticoagulants: Secondary | ICD-10-CM

## 2016-05-01 DIAGNOSIS — I4891 Unspecified atrial fibrillation: Secondary | ICD-10-CM | POA: Diagnosis not present

## 2016-05-01 DIAGNOSIS — Z4682 Encounter for fitting and adjustment of non-vascular catheter: Secondary | ICD-10-CM | POA: Diagnosis not present

## 2016-05-01 DIAGNOSIS — I5042 Chronic combined systolic (congestive) and diastolic (congestive) heart failure: Secondary | ICD-10-CM | POA: Diagnosis not present

## 2016-05-01 DIAGNOSIS — I110 Hypertensive heart disease with heart failure: Secondary | ICD-10-CM | POA: Diagnosis not present

## 2016-05-01 DIAGNOSIS — J90 Pleural effusion, not elsewhere classified: Secondary | ICD-10-CM | POA: Diagnosis not present

## 2016-05-01 DIAGNOSIS — E039 Hypothyroidism, unspecified: Secondary | ICD-10-CM | POA: Diagnosis not present

## 2016-05-01 LAB — POCT INR: INR: 1.3

## 2016-05-02 LAB — FUNGUS CULTURE WITH STAIN

## 2016-05-02 LAB — FUNGUS CULTURE RESULT

## 2016-05-02 LAB — FUNGAL ORGANISM REFLEX

## 2016-05-03 DIAGNOSIS — E785 Hyperlipidemia, unspecified: Secondary | ICD-10-CM | POA: Diagnosis not present

## 2016-05-03 DIAGNOSIS — Z4682 Encounter for fitting and adjustment of non-vascular catheter: Secondary | ICD-10-CM | POA: Diagnosis not present

## 2016-05-03 DIAGNOSIS — I5042 Chronic combined systolic (congestive) and diastolic (congestive) heart failure: Secondary | ICD-10-CM | POA: Diagnosis not present

## 2016-05-03 DIAGNOSIS — E039 Hypothyroidism, unspecified: Secondary | ICD-10-CM | POA: Diagnosis not present

## 2016-05-03 DIAGNOSIS — I4891 Unspecified atrial fibrillation: Secondary | ICD-10-CM | POA: Diagnosis not present

## 2016-05-03 DIAGNOSIS — Z7901 Long term (current) use of anticoagulants: Secondary | ICD-10-CM | POA: Diagnosis not present

## 2016-05-03 DIAGNOSIS — I110 Hypertensive heart disease with heart failure: Secondary | ICD-10-CM | POA: Diagnosis not present

## 2016-05-03 DIAGNOSIS — J90 Pleural effusion, not elsewhere classified: Secondary | ICD-10-CM | POA: Diagnosis not present

## 2016-05-06 DIAGNOSIS — J90 Pleural effusion, not elsewhere classified: Secondary | ICD-10-CM | POA: Diagnosis not present

## 2016-05-06 DIAGNOSIS — I5042 Chronic combined systolic (congestive) and diastolic (congestive) heart failure: Secondary | ICD-10-CM | POA: Diagnosis not present

## 2016-05-06 DIAGNOSIS — Z7901 Long term (current) use of anticoagulants: Secondary | ICD-10-CM | POA: Diagnosis not present

## 2016-05-06 DIAGNOSIS — Z4682 Encounter for fitting and adjustment of non-vascular catheter: Secondary | ICD-10-CM | POA: Diagnosis not present

## 2016-05-06 DIAGNOSIS — I4891 Unspecified atrial fibrillation: Secondary | ICD-10-CM | POA: Diagnosis not present

## 2016-05-06 DIAGNOSIS — E039 Hypothyroidism, unspecified: Secondary | ICD-10-CM | POA: Diagnosis not present

## 2016-05-06 DIAGNOSIS — E785 Hyperlipidemia, unspecified: Secondary | ICD-10-CM | POA: Diagnosis not present

## 2016-05-06 DIAGNOSIS — I110 Hypertensive heart disease with heart failure: Secondary | ICD-10-CM | POA: Diagnosis not present

## 2016-05-14 ENCOUNTER — Other Ambulatory Visit: Payer: Self-pay | Admitting: Cardiothoracic Surgery

## 2016-05-14 DIAGNOSIS — J90 Pleural effusion, not elsewhere classified: Secondary | ICD-10-CM

## 2016-05-15 ENCOUNTER — Ambulatory Visit (INDEPENDENT_AMBULATORY_CARE_PROVIDER_SITE_OTHER): Payer: PPO | Admitting: Cardiothoracic Surgery

## 2016-05-15 ENCOUNTER — Encounter: Payer: Self-pay | Admitting: Cardiothoracic Surgery

## 2016-05-15 ENCOUNTER — Ambulatory Visit (INDEPENDENT_AMBULATORY_CARE_PROVIDER_SITE_OTHER): Payer: PPO | Admitting: Pharmacist Clinician (PhC)/ Clinical Pharmacy Specialist

## 2016-05-15 ENCOUNTER — Ambulatory Visit
Admission: RE | Admit: 2016-05-15 | Discharge: 2016-05-15 | Disposition: A | Discharge status: Home / Self Care | Payer: PPO | Source: Ambulatory Visit | Attending: Cardiothoracic Surgery | Admitting: Cardiothoracic Surgery

## 2016-05-15 VITALS — BP 112/70 | HR 72 | Resp 20 | Ht 69.0 in | Wt 186.0 lb

## 2016-05-15 DIAGNOSIS — I4891 Unspecified atrial fibrillation: Secondary | ICD-10-CM | POA: Diagnosis not present

## 2016-05-15 DIAGNOSIS — J948 Other specified pleural conditions: Secondary | ICD-10-CM

## 2016-05-15 DIAGNOSIS — Z09 Encounter for follow-up examination after completed treatment for conditions other than malignant neoplasm: Secondary | ICD-10-CM | POA: Diagnosis not present

## 2016-05-15 DIAGNOSIS — I482 Chronic atrial fibrillation, unspecified: Secondary | ICD-10-CM

## 2016-05-15 DIAGNOSIS — J90 Pleural effusion, not elsewhere classified: Secondary | ICD-10-CM

## 2016-05-15 DIAGNOSIS — Z452 Encounter for adjustment and management of vascular access device: Secondary | ICD-10-CM | POA: Diagnosis not present

## 2016-05-15 DIAGNOSIS — Z7901 Long term (current) use of anticoagulants: Secondary | ICD-10-CM | POA: Diagnosis not present

## 2016-05-15 DIAGNOSIS — I4821 Permanent atrial fibrillation: Secondary | ICD-10-CM

## 2016-05-15 LAB — POCT INR: INR: 1.7

## 2016-05-15 NOTE — Progress Notes (Signed)
PCP is No PCP Per Patient Referring Provider is Juanito Doom, MD  Chief Complaint  Patient presents with  . Routine Post Op    1 week f/u with CXR    [id number] returns for Pleurx follow up with chest x-ray Patient has history of a loculated recurrent right pleural effusion from A. fib-chronic heart failure. Pleurx catheter placed last month with improved symptoms and partially improved chest x-ray. Patient also had VATS with chemical pleurodesis at that time Monday Wednesday Friday schedule of drainage. Drainage has. From 50 cc to 300 cc usually alternating. Will reduce drainage schedule to twice weekly Pleurx catheter site examined and is clean and dry, suture securing the catheter to the skin was removed and new dressing applied Chest x-ray today shows Pleurx catheter in good position with entrapped right lower lobe from chronic peel and loculated mild-moderate effusion, unchanged from last month   Past Medical History:  Diagnosis Date  . Anxiety   . Atrial fibrillation ([place])   . Cardiomyopathy, ischemic 11/07/2013  . CHB (complete heart block) ([place]) 11/06/2013  . Chronic combined systolic and diastolic CHF (congestive heart failure) ([place])   . Colon polyps   . Colon polyps   . Congenital heart block   . Constipation   . CVA (cerebral infarction)   . Depression   . Diverticulosis   . Esophageal stricture   . Family history of adverse reaction to anesthesia   . Hyperlipidemia   . Hypertension   . Hyperthyroidism 11/16/2015  . Lymphoma ([place]) 1998   of colon   . Presence of permanent cardiac pacemaker    St. Jude  pt.states he is totally dependent on pacemaker  . Shortness of breath dyspnea    Pulmonary Effusion  . Stroke [facility])    [id number] TIA  . Umbilical hernia   . Urinary frequency     Past Surgical History:  Procedure Laterality Date  . APPENDECTOMY  1953  . CARDIAC CATHETERIZATION  01/06/2003   Recommend medical therapy  . CARDIOVASCULAR STRESS TEST   07/16/2012   Mild-moderate perfusion defect seen in Basal inferior, Mid inferior, and Apica lateral consistent with infarct/scar. No scintigraphic evidence for inducible myocardial ischemia. No ECG changes. EKG negative for ischemia.  Marland Kitchen CAROTID DOPPLER  11/04/2012   Proximal Rt ICA 50-99% diameter reduction; Lft Bulb demonstrated mild amount homogeneous plaque-not hemodynamically significant; Lft ICA-normal patency.  . CHEST TUBE INSERTION Right 03/15/2016   Procedure: INSERTION PLEURAL DRAINAGE CATHETER;  Surgeon: Ivin Poot, MD;  Location: [place];  Service: Thoracic;  Laterality: Right;  . COLECTOMY     for lymphoma  . COLONOSCOPY    . neck fusion    . PACEMAKER GENERATOR CHANGE  01/31/2012   St Jude Med Accent DR RF model [id number] serial [id number]  . PACEMAKER PLACEMENT     replaced 3 x  . PERMANENT PACEMAKER GENERATOR CHANGE N/A 01/31/2012   Procedure: PERMANENT PACEMAKER GENERATOR CHANGE;  Surgeon: Sanda Klein, MD;  Location: [place] CATH LAB;   . PLEURADESIS N/A 04/02/2016   Procedure: PLEURADESIS;  Surgeon: Ivin Poot, MD;  Location: [facility] OR;  Service: Thoracic;  Laterality: N/A;  . TONSILLECTOMY    . TRANSTHORACIC ECHOCARDIOGRAM  11/04/2012   EF 123456, systolic function moderately reduced, mild regurg of the aortic and mitral valves.  Marland Kitchen VIDEO ASSISTED THORACOSCOPY Right 04/02/2016   Procedure: Right VIDEO ASSISTED THORACOSCOPY with Biopsies and drainage pleural effusion;  Surgeon: Ivin Poot, MD;  Location: [place];  Service: Thoracic;  Laterality: Right;    Family History  Problem Relation Age of Onset  . Prostate cancer Father   . Colon cancer Neg Hx   . Diabetes Maternal Grandmother   . Heart disease Mother   . Stroke Mother   . Heart attack Neg Hx     Social History Social History  Substance Use Topics  . Smoking status: Never Smoker  . Smokeless tobacco: Never Used  . Alcohol use No    Current Outpatient Prescriptions  Medication Sig Dispense Refill  .  acetaminophen (TYLENOL) 500 MG tablet Take 500 mg by mouth every 6 (six) hours as needed (For pain.).    Marland Kitchen chlorpheniramine-HYDROcodone (TUSSIONEX) 10-8 MG/5ML SUER Take 5 mLs by mouth at bedtime as needed for cough.   0  . furosemide (LASIX) 20 MG tablet Take 1 tablet (20 mg total) by mouth daily. 30 tablet 5  . hydrocortisone cream 1 % Apply 1 application topically 2 (two) times daily as needed for itching.     . methimazole (TAPAZOLE) 10 MG tablet Take 60 mg by mouth daily.    . metoprolol succinate (TOPROL-XL) 25 MG 24 hr tablet Take 1 tablet (25 mg total) by mouth daily. 30 tablet 11  . pravastatin (PRAVACHOL) 80 MG tablet TAKE 1 TABLET BY MOUTH EVERY DAY (Patient taking differently: TAKE 1 TABLET BY MOUTH EVERY DAY AT 8PM.) 90 tablet 3  . Tamsulosin HCl (FLOMAX) 0.4 MG CAPS Take 0.4 mg by mouth at bedtime.     . traMADol (ULTRAM) 50 MG tablet Take 1 tablet (50 mg total) by mouth every 6 (six) hours as needed (For pain.). 30 tablet 1  . traZODone (DESYREL) 50 MG tablet Take 1 tablet (50 mg total) by mouth at bedtime as needed for sleep. 30 tablet 2  . warfarin (COUMADIN) 2.5 MG tablet Take 1/2 -1 tablet by mouth daily as directed by coumadin clinic (Patient taking differently: Take 1.25-2.5 mg by mouth daily at 6 PM. Take half a tablet (1.25mg ) on Tuesday, Thursday, Sunday and one tablet (2.5mg ) the rest of the week.) 90 tablet 1   No current facility-administered medications for this visit.     Allergies  Allergen Reactions  . Statins Other (See Comments)    Leg cramps.   Tolerates pravastatin.    . Latex Other (See Comments)    Skin irritation  . Adhesive [Tape] Other (See Comments)    Skin irritation - please use paper tape  . Penicillins Rash and Other (See Comments)    Has patient had a PCN reaction causing immediate rash, facial/tongue/throat swelling, SOB or lightheadedness with hypotension: Yes Has patient had a PCN reaction causing severe rash involving mucus membranes or skin  necrosis: No Has patient had a PCN reaction that required hospitalization No Has patient had a PCN reaction occurring within the last 10 years: No If all of the above answers are "NO", then may proceed with Cephalosporin use.    Review of Systems  Cough is improved No shortness of breath No drainage or redness around catheter site  BP 112/70 (BP Location: Right Arm, Patient Position: Sitting, Cuff Size: Normal)   Pulse 72   Resp 20   Ht 5\' 9"  (1.753 m)   Wt 186 lb (84.4 kg)   SpO2 98% Comment: RA  BMI 27.47 kg/m  Physical Exam      Exam    General- alert and comfortable   Lungs-clear on left, slightly diminished at right base   Cor- paced rate  and rhythm, no murmur , gallop   Abdomen- soft, non-tender   Extremities - warm, non-tender, minimal edema, chronic tibial erythema from venous insufficiency   Neuro- oriented, appropriate, no focal weakness   Diagnostic Tests: Chest x-ray personally reviewed showing persistent mild-moderate loculated effusion, Pleurx catheter in good position  Impression: Pleurx catheter has significantly improved patient's symptoms and we'll continue Because of the repeated episodes of minimal drainage we'll reduce drainage schedule to twice a week. He'll continue his heart failure meds.  Plan: Return with chest x-ray for Pleurx catheter check in 4-6 weeks.   Len Childs, MD [facility] [phone]

## 2016-05-16 LAB — ACID FAST CULTURE WITH REFLEXED SENSITIVITIES (MYCOBACTERIA): Acid Fast Culture: NEGATIVE

## 2016-05-21 DIAGNOSIS — R946 Abnormal results of thyroid function studies: Secondary | ICD-10-CM | POA: Diagnosis not present

## 2016-05-21 DIAGNOSIS — E049 Nontoxic goiter, unspecified: Secondary | ICD-10-CM | POA: Diagnosis not present

## 2016-05-21 DIAGNOSIS — E0580 Other thyrotoxicosis without thyrotoxic crisis or storm: Secondary | ICD-10-CM | POA: Diagnosis not present

## 2016-05-21 DIAGNOSIS — Z5181 Encounter for therapeutic drug level monitoring: Secondary | ICD-10-CM | POA: Diagnosis not present

## 2016-05-27 ENCOUNTER — Ambulatory Visit (INDEPENDENT_AMBULATORY_CARE_PROVIDER_SITE_OTHER): Payer: PPO | Admitting: Pharmacist

## 2016-05-27 DIAGNOSIS — I4821 Permanent atrial fibrillation: Secondary | ICD-10-CM

## 2016-05-27 DIAGNOSIS — Z7901 Long term (current) use of anticoagulants: Secondary | ICD-10-CM

## 2016-05-27 DIAGNOSIS — I482 Chronic atrial fibrillation, unspecified: Secondary | ICD-10-CM

## 2016-05-27 DIAGNOSIS — I4891 Unspecified atrial fibrillation: Secondary | ICD-10-CM

## 2016-05-27 LAB — POCT INR: INR: 1.5

## 2016-06-11 ENCOUNTER — Ambulatory Visit (INDEPENDENT_AMBULATORY_CARE_PROVIDER_SITE_OTHER): Payer: PPO | Admitting: Cardiovascular Disease

## 2016-06-11 ENCOUNTER — Encounter: Payer: Self-pay | Admitting: Cardiovascular Disease

## 2016-06-11 ENCOUNTER — Ambulatory Visit (INDEPENDENT_AMBULATORY_CARE_PROVIDER_SITE_OTHER): Payer: PPO | Admitting: Pharmacist Clinician (PhC)/ Clinical Pharmacy Specialist

## 2016-06-11 ENCOUNTER — Encounter (INDEPENDENT_AMBULATORY_CARE_PROVIDER_SITE_OTHER): Payer: Self-pay

## 2016-06-11 VITALS — BP 120/80 | HR 66 | Ht 69.0 in | Wt 179.2 lb

## 2016-06-11 DIAGNOSIS — I4891 Unspecified atrial fibrillation: Secondary | ICD-10-CM | POA: Diagnosis not present

## 2016-06-11 DIAGNOSIS — I2510 Atherosclerotic heart disease of native coronary artery without angina pectoris: Secondary | ICD-10-CM

## 2016-06-11 DIAGNOSIS — J90 Pleural effusion, not elsewhere classified: Secondary | ICD-10-CM

## 2016-06-11 DIAGNOSIS — Z7901 Long term (current) use of anticoagulants: Secondary | ICD-10-CM

## 2016-06-11 DIAGNOSIS — I4821 Permanent atrial fibrillation: Secondary | ICD-10-CM

## 2016-06-11 DIAGNOSIS — E785 Hyperlipidemia, unspecified: Secondary | ICD-10-CM

## 2016-06-11 DIAGNOSIS — T462X5A Adverse effect of other antidysrhythmic drugs, initial encounter: Secondary | ICD-10-CM

## 2016-06-11 DIAGNOSIS — I10 Essential (primary) hypertension: Secondary | ICD-10-CM

## 2016-06-11 DIAGNOSIS — I442 Atrioventricular block, complete: Secondary | ICD-10-CM | POA: Diagnosis not present

## 2016-06-11 DIAGNOSIS — I482 Chronic atrial fibrillation, unspecified: Secondary | ICD-10-CM

## 2016-06-11 DIAGNOSIS — I701 Atherosclerosis of renal artery: Secondary | ICD-10-CM

## 2016-06-11 DIAGNOSIS — E0580 Other thyrotoxicosis without thyrotoxic crisis or storm: Secondary | ICD-10-CM

## 2016-06-11 DIAGNOSIS — Z950 Presence of cardiac pacemaker: Secondary | ICD-10-CM

## 2016-06-11 DIAGNOSIS — Z8679 Personal history of other diseases of the circulatory system: Secondary | ICD-10-CM

## 2016-06-11 DIAGNOSIS — I5022 Chronic systolic (congestive) heart failure: Secondary | ICD-10-CM

## 2016-06-11 DIAGNOSIS — I6523 Occlusion and stenosis of bilateral carotid arteries: Secondary | ICD-10-CM

## 2016-06-11 LAB — CUP PACEART INCLINIC DEVICE CHECK
Battery Remaining Longevity: 111.6
Battery Voltage: 2.96 V
Brady Statistic RV Percent Paced: 96 %
Date Time Interrogation Session: 20170829144628
Lead Channel Pacing Threshold Amplitude: 0.75 V
Lead Channel Pacing Threshold Pulse Width: 0.5 ms
Lead Channel Setting Pacing Amplitude: 2.5 V
Lead Channel Setting Pacing Pulse Width: 0.5 ms
Lead Channel Setting Sensing Sensitivity: 2.5 mV
MDC IDC LEAD IMPLANT DT: 20130419
MDC IDC LEAD LOCATION: 753860
MDC IDC MSMT LEADCHNL RV IMPEDANCE VALUE: 437.5 Ohm
MDC IDC MSMT LEADCHNL RV PACING THRESHOLD AMPLITUDE: 0.75 V
MDC IDC MSMT LEADCHNL RV PACING THRESHOLD PULSEWIDTH: 0.5 ms
MDC IDC MSMT LEADCHNL RV SENSING INTR AMPL: 6.5 mV
MDC IDC PG SERIAL: 7313697

## 2016-06-11 LAB — POCT INR: INR: 1.5

## 2016-06-11 NOTE — Patient Instructions (Signed)
Dr Sallyanne Kuster recommends that you continue on your current medications as directed. Please refer to the Current Medication list given to you today.  Remote monitoring is used to monitor your Pacemaker of ICD from home. This monitoring reduces the number of office visits required to check your device to one time per year. It allows [place] to keep an eye on the functioning of your device to ensure it is working properly. You are scheduled for a device check from home on Tuesday, November 28th, 2017. You may send your transmission at any time that day. If you have a wireless device, the transmission will be sent automatically. After your physician reviews your transmission, you will receive a postcard with your next transmission date.  Dr Sallyanne Kuster recommends that you schedule a follow-up appointment in 6 months with a pacemaker check. You will receive a reminder letter in the mail two months in advance. If you don't receive a letter, please call our office to schedule the follow-up appointment.  If you need a refill on your cardiac medications before your next appointment, please call your pharmacy.

## 2016-06-11 NOTE — Progress Notes (Signed)
Patient ID: DAMAIN VASA, male   DOB: 1946/01/29, 70 y.o.   MRN: [id number] Patient ID: V[facility], male   DOB: [birth year], 70 y.o.   MRN: [id number]    Cardiology Office Note    Date:  06/12/2016   ID:  RAMONTE FECTEAU, DOB 12/17/1945, MRN [id number]  PCP:  No PCP Per Patient  Cardiologist:   Sanda Klein, MD   Chief complaint: weakness, weight loss   History of Present Illness:  Benjamin Mckay is a 70 y.o. male with complex cardiac history (complete heart block-pacemaker dependent, chronic atrial fibrillation versus atrial standstill, history of sustained ventricular tachycardia and near syncope, coronary artery disease with history of inferior wall scar due to myocardial infarction, renal and carotid artery stenosis), amiodarone induced hyperthyroidism and a recurrent right pleural effusion.  I don't have his most recent labs, but he tells me that Dr. Buddy Duty reduced the methimazole dose since he is now euthyroid. He definitely looks euthyroid from a clinical point of view now. Amiodarone was stopped completely on March 31.  He continues to have a right lower lung Pleurx catheter in place for a chronic effusion and right lower lobe . He had a pleurodesis in June. He last saw Dr. Prescott Gum on August 2. He is now only draining the effusion 3 times weekly and for the most part has been draining 10-50 mL each time. Unexpectedly, last Friday that catheter draining 300 mL  He denies dyspnea at rest or with usual exertion and has not had leg edema. He has not had palpitations or syncope. He denies claudication or any focal neurological events or bleeding problems.   Interrogation of his pacemaker ([place], 2013) shows no episodes of ventricular tachycardia even though he has been off amiodarone now for 5 months. As always he has 100% ventricular pacing. Device function is normal. The current generator was a change out implant in 2013 and has an estimated longevity of 7-11 years. He  does have an escape rhythm in the 30s. Ventricular lead parameters are excellent.  His electrocardiogram shows no visible atrial activity and mostly ventricular pacing, and occasional ventricular sensed beat with broad QRS complex. QTc interval has shortened to 505 ms (paced beat).  Past Medical History:  Diagnosis Date  . Anxiety   . Atrial fibrillation ([place])   . Cardiomyopathy, ischemic 11/07/2013  . CHB (complete heart block) ([place]) 11/06/2013  . Chronic combined systolic and diastolic CHF (congestive heart failure) ([place])   . Colon polyps   . Colon polyps   . Congenital heart block   . Constipation   . CVA (cerebral infarction)   . Depression   . Diverticulosis   . Esophageal stricture   . Family history of adverse reaction to anesthesia   . Hyperlipidemia   . Hypertension   . Hyperthyroidism 11/16/2015  . Lymphoma ([place]) 1998   of colon   . Presence of permanent cardiac pacemaker    St. Jude  pt.states he is totally dependent on pacemaker  . Shortness of breath dyspnea    Pulmonary Effusion  . Stroke [facility])    [id number] TIA  . Umbilical hernia   . Urinary frequency     Past Surgical History:  Procedure Laterality Date  . APPENDECTOMY  1953  . CARDIAC CATHETERIZATION  01/06/2003   Recommend medical therapy  . CARDIOVASCULAR STRESS TEST  07/16/2012   Mild-moderate perfusion defect seen in Basal inferior, Mid inferior, and Apica lateral consistent with infarct/scar. No  scintigraphic evidence for inducible myocardial ischemia. No ECG changes. EKG negative for ischemia.  Marland Kitchen CAROTID DOPPLER  11/04/2012   Proximal Rt ICA 50-99% diameter reduction; Lft Bulb demonstrated mild amount homogeneous plaque-not hemodynamically significant; Lft ICA-normal patency.  . CHEST TUBE INSERTION Right 03/15/2016   Procedure: INSERTION PLEURAL DRAINAGE CATHETER;  Surgeon: Ivin Poot, MD;  Location: [place];  Service: Thoracic;  Laterality: Right;  . COLECTOMY     for lymphoma  . COLONOSCOPY    .  neck fusion    . PACEMAKER GENERATOR CHANGE  01/31/2012   St Jude Med Accent DR RF model [id number] serial [id number]  . PACEMAKER PLACEMENT     replaced 3 x  . PERMANENT PACEMAKER GENERATOR CHANGE N/A 01/31/2012   Procedure: PERMANENT PACEMAKER GENERATOR CHANGE;  Surgeon: Sanda Klein, MD;  Location: [place] CATH LAB;   . PLEURADESIS N/A 04/02/2016   Procedure: PLEURADESIS;  Surgeon: Ivin Poot, MD;  Location: [facility];  Service: Thoracic;  Laterality: N/A;  . TONSILLECTOMY    . TRANSTHORACIC ECHOCARDIOGRAM  11/04/2012   EF 123456, systolic function moderately reduced, mild regurg of the aortic and mitral valves.  Marland Kitchen VIDEO ASSISTED THORACOSCOPY Right 04/02/2016   Procedure: Right VIDEO ASSISTED THORACOSCOPY with Biopsies and drainage pleural effusion;  Surgeon: Ivin Poot, MD;  Location: [facility] OR;  Service: Thoracic;  Laterality: Right;    Current Medications: Outpatient Medications Prior to Visit  Medication Sig Dispense Refill  . acetaminophen (TYLENOL) 500 MG tablet Take 500 mg by mouth every 6 (six) hours as needed (For pain.).    Marland Kitchen furosemide (LASIX) 20 MG tablet Take 1 tablet (20 mg total) by mouth daily. 30 tablet 5  . hydrocortisone cream 1 % Apply 1 application topically 2 (two) times daily as needed for itching.     . methimazole (TAPAZOLE) 10 MG tablet Take 60 mg by mouth daily.    . metoprolol succinate (TOPROL-XL) 25 MG 24 hr tablet Take 1 tablet (25 mg total) by mouth daily. 30 tablet 11  . pravastatin (PRAVACHOL) 80 MG tablet TAKE 1 TABLET BY MOUTH EVERY DAY (Patient taking differently: TAKE 1 TABLET BY MOUTH EVERY DAY AT 8PM.) 90 tablet 3  . Tamsulosin HCl (FLOMAX) 0.4 MG CAPS Take 0.4 mg by mouth at bedtime.     Marland Kitchen warfarin (COUMADIN) 2.5 MG tablet Take 1/2 -1 tablet by mouth daily as directed by coumadin clinic (Patient taking differently: Take 1.25-2.5 mg by mouth daily at 6 PM. Take half a tablet (1.25mg ) on Tuesday, Thursday, Sunday and one tablet (2.5mg ) the rest of the week.) 90  tablet 1  . chlorpheniramine-HYDROcodone (TUSSIONEX) 10-8 MG/5ML SUER Take 5 mLs by mouth at bedtime as needed for cough.   0  . traMADol (ULTRAM) 50 MG tablet Take 1 tablet (50 mg total) by mouth every 6 (six) hours as needed (For pain.). (Patient not taking: Reported on 06/11/2016) 30 tablet 1  . traZODone (DESYREL) 50 MG tablet Take 1 tablet (50 mg total) by mouth at bedtime as needed for sleep. (Patient not taking: Reported on 06/11/2016) 30 tablet 2   No facility-administered medications prior to visit.      Allergies:   Statins; Latex; Adhesive [tape]; and Penicillins   Social History   Social History  . Marital status: Married    Spouse name: Chartered certified accountant  . Number of children: 3  . Years of education: BS   Occupational History  . Retired    Social History Main Topics  .  Smoking status: Never Smoker  . Smokeless tobacco: Never Used  . Alcohol use No  . Drug use: No  . Sexual activity: Not Asked   Other Topics Concern  . None   Social History Narrative   Consumes caffeine 2 cups per day       Family History:  The patient's family history includes Diabetes in his maternal grandmother; Heart disease in his mother; Prostate cancer in his father; Stroke in his mother.   ROS:   Please see the history of present illness.    ROS All other systems reviewed and are negative.   PHYSICAL EXAM:   VS:  BP 120/80 (BP Location: Right Arm, Patient Position: Sitting, Cuff Size: Normal)   Pulse 66   Ht 5\' 9"  (1.753 m)   Wt 179 lb 3.2 oz (81.3 kg)   SpO2 98%   BMI 26.46 kg/m    GEN: Well nourished, well developed, in no acute distress. He looks fit and comfortable.  HEENT: normal  Neck: no JVD, carotid bruits, or masses Cardiac: Paradoxically split second heart sound, RRR; no murmurs, rubs, or gallops,no edema, healthy pacemaker site Respiratory:  Reduced breath sounds in right lower lobe, otherwise clear to auscultation bilaterally, normal work of breathing GI: soft,  nontender, nondistended, + BS MS: no deformity or atrophy  Skin: warm and dry, no rash Neuro:  Alert and Oriented x 3, Strength and sensation are intact Psych: euthymic mood, full affect  Wt Readings from Last 3 Encounters:  06/11/16 179 lb 3.2 oz (81.3 kg)  05/15/16 186 lb (84.4 kg)  04/18/16 186 lb (84.4 kg)      Studies/Labs Reviewed:   EKG:  EKG is ordered today. There is no atrial activity. There is mostly ventricular pacing with a couple of broad QRS ventricular sensed beats. QTC 505 ms  Recent Labs: 11/18/2015: TSH 0.047 12/04/2015: Brain Natriuretic Peptide 468.8 04/04/2016: ALT 9; Hemoglobin 14.1; Platelets 262 04/05/2016: BUN 7; Creatinine, Ser 0.88; Potassium 4.0; Sodium 132   Lipid Panel    Component Value Date/Time   CHOL 205 (H) 12/08/2014 0235   TRIG 143 12/08/2014 0235   HDL 32 (L) 12/08/2014 0235   CHOLHDL 6.4 12/08/2014 0235   VLDL 29 12/08/2014 0235   LDLCALC 144 (H) 12/08/2014 0235     ASSESSMENT:    1. Chronic systolic congestive heart failure ([place])   2. CHB (complete heart block) (H[facility])   3. Chronic atrial fibrillation ([place])   4. Coronary artery disease involving native coronary artery of native heart without angina pectoris   5. Essential hypertension   6. Pacemaker   7. History of sustained ventricular tachycardia   8. Amiodarone-induced hyperthyroidism   9. Warfarin anticoagulation   10. Renal artery stenosis ([place])   11. Carotid stenosis, bilateral   12. Loculated pleural effusion   13. Hyperlipidemia      PLAN:  In order of problems listed above:  1. CHF: Appears to be at euvolemic state. We'll continue current dose of diuretic. NYHA functional class I-II . Most recent echo in February shows ejection fraction of 45-50%. He did have moderate pulmonary hypertension with estimated PA pressure 54 mmHg. 2. CHB: Pacemaker dependent. Upgrade to CRT device has been discussed but felt to be high-risk due to his multiple previous surgical procedures  and increased risk of infection 3. AFib: CHADSVasc 4 (age, CHF, CAD, HTN). Warfarin anticoagulation without bleeding complications and without history of stroke/TIA. 4. CAD: Scar of previous inferior wall myocardial infarction, currently free  of angina pectoris. 5. HTN: Well controlled 6. PPM: Normal device function.   7. VT: None has been recorded since he was started on treatment with amiodarone in 2014 for an episode of near syncope and sustained monomorphic VT with a fairly slow cycle length of 370 ms (inferior scar VT?). Amiodarone has now been discontinued for 5 months without any recurrence of the ventricular arrhythmia. 8. Amiodarone-induced  hyperthyroidism: Reportedly euthyroid, gradually reducing dose of anti-thyroid medications  9. Anticoagulation: on warfarin 10. RAS:  Normal renal function and blood pressure  11. Carotid stenosis: Rechecked 12/2015, mild progression, reevaluate in 6/12 months.  12. R pleural effusion: Exudative effusion of unclear etiology, s/p pleurodesis, overall fluid drainage appears to be gradually diminishing. 13. HLP: Recheck lipids before his next appointment    Medication Adjustments/Labs and Tests Ordered: Current medicines are reviewed at length with the patient today.  Concerns regarding medicines are outlined above.  Medication changes, Labs and Tests ordered today are listed in the Patient Instructions below. Patient Instructions  Dr Sallyanne Kuster recommends that you continue on your current medications as directed. Please refer to the Current Medication list given to you today.  Remote monitoring is used to monitor your Pacemaker of ICD from home. This monitoring reduces the number of office visits required to check your device to one time per year. It allows [place] to keep an eye on the functioning of your device to ensure it is working properly. You are scheduled for a device check from home on Tuesday, November 28th, 2017. You may send your transmission at  any time that day. If you have a wireless device, the transmission will be sent automatically. After your physician reviews your transmission, you will receive a postcard with your next transmission date.  Dr Sallyanne Kuster recommends that you schedule a follow-up appointment in 6 months with a pacemaker check. You will receive a reminder letter in the mail two months in advance. If you don't receive a letter, please call our office to schedule the follow-up appointment.  If you need a refill on your cardiac medications before your next appointment, please call your pharmacy.    Signed, Sanda Klein, MD  06/12/2016 6:23 PM    [address], [place], [place]  [address] Phone: [phone]; Fax: [phone]

## 2016-06-12 DIAGNOSIS — I472 Ventricular tachycardia, unspecified: Secondary | ICD-10-CM | POA: Insufficient documentation

## 2016-06-19 ENCOUNTER — Other Ambulatory Visit: Payer: Self-pay | Admitting: Cardiothoracic Surgery

## 2016-06-19 ENCOUNTER — Encounter: Payer: PPO | Admitting: Cardiothoracic Surgery

## 2016-06-19 DIAGNOSIS — I25119 Atherosclerotic heart disease of native coronary artery with unspecified angina pectoris: Secondary | ICD-10-CM

## 2016-06-21 ENCOUNTER — Other Ambulatory Visit: Payer: Self-pay | Admitting: *Deleted

## 2016-06-21 ENCOUNTER — Telehealth: Payer: Self-pay

## 2016-06-21 ENCOUNTER — Encounter: Payer: Self-pay | Admitting: Cardiothoracic Surgery

## 2016-06-21 ENCOUNTER — Ambulatory Visit
Admission: RE | Admit: 2016-06-21 | Discharge: 2016-06-21 | Disposition: A | Discharge status: Home / Self Care | Payer: PPO | Source: Ambulatory Visit | Attending: Cardiothoracic Surgery | Admitting: Cardiothoracic Surgery

## 2016-06-21 ENCOUNTER — Ambulatory Visit (INDEPENDENT_AMBULATORY_CARE_PROVIDER_SITE_OTHER): Payer: Self-pay | Admitting: Cardiothoracic Surgery

## 2016-06-21 VITALS — BP 118/75 | HR 81 | Resp 16 | Ht 69.0 in | Wt 179.0 lb

## 2016-06-21 DIAGNOSIS — E785 Hyperlipidemia, unspecified: Secondary | ICD-10-CM

## 2016-06-21 DIAGNOSIS — J90 Pleural effusion, not elsewhere classified: Secondary | ICD-10-CM

## 2016-06-21 DIAGNOSIS — I25119 Atherosclerotic heart disease of native coronary artery with unspecified angina pectoris: Secondary | ICD-10-CM

## 2016-06-21 DIAGNOSIS — Z79899 Other long term (current) drug therapy: Secondary | ICD-10-CM

## 2016-06-21 DIAGNOSIS — J948 Other specified pleural conditions: Secondary | ICD-10-CM

## 2016-06-21 DIAGNOSIS — Z09 Encounter for follow-up examination after completed treatment for conditions other than malignant neoplasm: Secondary | ICD-10-CM

## 2016-06-21 DIAGNOSIS — R079 Chest pain, unspecified: Secondary | ICD-10-CM | POA: Diagnosis not present

## 2016-06-21 DIAGNOSIS — G8918 Other acute postprocedural pain: Secondary | ICD-10-CM

## 2016-06-21 MED ORDER — TRAMADOL HCL 50 MG PO TABS: 50.0000 mg | ORAL_TABLET | Freq: Four times a day (QID) | ORAL | 0 refills | Status: DC | PRN

## 2016-06-21 NOTE — Progress Notes (Signed)
PCP is No PCP Per Patient Referring Provider is Juanito Doom, MD  Chief Complaint  Patient presents with  . Routine Post Op    5 wk f/u with CXR to assess right pleurX/effusion    HPI: Routine Pleurx catheter follow-up Patient has a benign recurrent right pleural effusion from CHF. Drainage has been minimal on a twice a week schedule Fluid remains bloody as the patient is on Coumadin He denies symptoms of cough or shortness of breath Chest x-ray shows persistent loculated right pleural effusion with some entrapment of the right lower lobe We will reduce his Pleurx catheter to once a week and then plan on removing the catheter if there is no change in the drainage pattern on his next visit  Past Medical History:  Diagnosis Date  . Anxiety   . Atrial fibrillation ([place])   . Cardiomyopathy, ischemic 11/07/2013  . CHB (complete heart block) ([place]) 11/06/2013  . Chronic combined systolic and diastolic CHF (congestive heart failure) ([place])   . Colon polyps   . Colon polyps   . Congenital heart block   . Constipation   . CVA (cerebral infarction)   . Depression   . Diverticulosis   . Esophageal stricture   . Family history of adverse reaction to anesthesia   . Hyperlipidemia   . Hypertension   . Hyperthyroidism 11/16/2015  . Lymphoma ([place]) 1998   of colon   . Presence of permanent cardiac pacemaker    St. Jude  pt.states he is totally dependent on pacemaker  . Shortness of breath dyspnea    Pulmonary Effusion  . Stroke [facility])    [id number] TIA  . Umbilical hernia   . Urinary frequency     Past Surgical History:  Procedure Laterality Date  . APPENDECTOMY  1953  . CARDIAC CATHETERIZATION  01/06/2003   Recommend medical therapy  . CARDIOVASCULAR STRESS TEST  07/16/2012   Mild-moderate perfusion defect seen in Basal inferior, Mid inferior, and Apica lateral consistent with infarct/scar. No scintigraphic evidence for inducible myocardial ischemia. No ECG changes. EKG negative for  ischemia.  Marland Kitchen CAROTID DOPPLER  11/04/2012   Proximal Rt ICA 50-99% diameter reduction; Lft Bulb demonstrated mild amount homogeneous plaque-not hemodynamically significant; Lft ICA-normal patency.  . CHEST TUBE INSERTION Right 03/15/2016   Procedure: INSERTION PLEURAL DRAINAGE CATHETER;  Surgeon: Ivin Poot, MD;  Location: [place];  Service: Thoracic;  Laterality: Right;  . COLECTOMY     for lymphoma  . COLONOSCOPY    . neck fusion    . PACEMAKER GENERATOR CHANGE  01/31/2012   St Jude Med Accent DR RF model [id number] serial [id number]  . PACEMAKER PLACEMENT     replaced 3 x  . PERMANENT PACEMAKER GENERATOR CHANGE N/A 01/31/2012   Procedure: PERMANENT PACEMAKER GENERATOR CHANGE;  Surgeon: Sanda Klein, MD;  Location: [place] CATH LAB;   . PLEURADESIS N/A 04/02/2016   Procedure: PLEURADESIS;  Surgeon: Ivin Poot, MD;  Location: [facility] OR;  Service: Thoracic;  Laterality: N/A;  . TONSILLECTOMY    . TRANSTHORACIC ECHOCARDIOGRAM  11/04/2012   EF 123456, systolic function moderately reduced, mild regurg of the aortic and mitral valves.  Marland Kitchen VIDEO ASSISTED THORACOSCOPY Right 04/02/2016   Procedure: Right VIDEO ASSISTED THORACOSCOPY with Biopsies and drainage pleural effusion;  Surgeon: Ivin Poot, MD;  Location: [facility] OR;  Service: Thoracic;  Laterality: Right;    Family History  Problem Relation Age of Onset  . Prostate cancer Father   . Colon cancer Neg  Hx   . Diabetes Maternal Grandmother   . Heart disease Mother   . Stroke Mother   . Heart attack Neg Hx     Social History Social History  Substance Use Topics  . Smoking status: Never Smoker  . Smokeless tobacco: Never Used  . Alcohol use No    Current Outpatient Prescriptions  Medication Sig Dispense Refill  . acetaminophen (TYLENOL) 500 MG tablet Take 500 mg by mouth every 6 (six) hours as needed (For pain.).    Marland Kitchen furosemide (LASIX) 20 MG tablet Take 1 tablet (20 mg total) by mouth daily. 30 tablet 5  . hydrocortisone cream 1 % Apply 1  application topically 2 (two) times daily as needed for itching.     . methimazole (TAPAZOLE) 10 MG tablet Take 60 mg by mouth daily.    . metoprolol succinate (TOPROL-XL) 25 MG 24 hr tablet Take 1 tablet (25 mg total) by mouth daily. 30 tablet 11  . pravastatin (PRAVACHOL) 80 MG tablet TAKE 1 TABLET BY MOUTH EVERY DAY (Patient taking differently: TAKE 1 TABLET BY MOUTH EVERY DAY AT 8PM.) 90 tablet 3  . Tamsulosin HCl (FLOMAX) 0.4 MG CAPS Take 0.4 mg by mouth at bedtime.     Marland Kitchen warfarin (COUMADIN) 2.5 MG tablet Take 1/2 -1 tablet by mouth daily as directed by coumadin clinic (Patient taking differently: Take 1.25-2.5 mg by mouth daily at 6 PM. Take half a tablet (1.25mg ) on Tuesday, Thursday, Sunday and one tablet (2.5mg ) the rest of the week.) 90 tablet 1  . traMADol (ULTRAM) 50 MG tablet Take 1 tablet (50 mg total) by mouth every 6 (six) hours as needed. 40 tablet 0   No current facility-administered medications for this visit.     Allergies  Allergen Reactions  . Statins Other (See Comments)    Leg cramps.   Tolerates pravastatin.    . Latex Other (See Comments)    Skin irritation  . Adhesive [Tape] Other (See Comments)    Skin irritation - please use paper tape  . Penicillins Rash and Other (See Comments)    Has patient had a PCN reaction causing immediate rash, facial/tongue/throat swelling, SOB or lightheadedness with hypotension: Yes Has patient had a PCN reaction causing severe rash involving mucus membranes or skin necrosis: No Has patient had a PCN reaction that required hospitalization No Has patient had a PCN reaction occurring within the last 10 years: No If all of the above answers are "NO", then may proceed with Cephalosporin use.    Review of Systems   Weight stable no fever No drainage around catheter site No cough or shortness of breath No chest pain but mild postthoracotomy pain requiring tramadol Heart failure well-controlled with medications and pacemaker  strategy  BP 118/75   Pulse 81   Resp 16   Ht 5\' 9"  (1.753 m)   Wt 179 lb (81.2 kg)   SpO2 99% Comment: ON RA  BMI 26.43 kg/m  Physical Exam      Exam    General- alert and comfortable   Lungs- clear without rales, wheezes, mild diminished breath sounds right base, well-healed right VATS incision   Cor- regular rate and rhythm, no murmur , gallop   Abdomen- soft, non-tender   Extremities - warm, non-tender, minimal edema   Neuro- oriented, appropriate, no focal weakness   Diagnostic Tests: Small loculated right pleural effusion, right Pleurx catheter in good position  Impression: Reduce drainage schedule to once a week Return for follow-up  in one month with chest x-ray Plan: We will probably be able to remove the catheter if the drainage pattern remains minimal. He had one episode of 300 cc of drainage since his last visit so we will not pull the catheter at this time  Len Childs, MD [facility] [phone]

## 2016-06-21 NOTE — Telephone Encounter (Signed)
Called patient with recommendations. Patient agreeable to plan.  Labs ordered and will be mailed to patient.

## 2016-06-21 NOTE — Telephone Encounter (Signed)
-----   Message from Sanda Klein, MD sent at 06/12/2016  6:24 PM EDT ----- Can we please get him to do a lipid profile whenever he next has blood work done? He might get that with his endocrinologist. Would like to have it done before the end of the year.

## 2016-06-25 ENCOUNTER — Ambulatory Visit (INDEPENDENT_AMBULATORY_CARE_PROVIDER_SITE_OTHER): Payer: PPO | Admitting: Pharmacist

## 2016-06-25 DIAGNOSIS — I482 Chronic atrial fibrillation, unspecified: Secondary | ICD-10-CM

## 2016-06-25 DIAGNOSIS — I4821 Permanent atrial fibrillation: Secondary | ICD-10-CM

## 2016-06-25 DIAGNOSIS — E0580 Other thyrotoxicosis without thyrotoxic crisis or storm: Secondary | ICD-10-CM | POA: Diagnosis not present

## 2016-06-25 DIAGNOSIS — I4891 Unspecified atrial fibrillation: Secondary | ICD-10-CM

## 2016-06-25 DIAGNOSIS — Z7901 Long term (current) use of anticoagulants: Secondary | ICD-10-CM

## 2016-06-25 LAB — POCT INR: INR: 1.5

## 2016-07-15 DIAGNOSIS — L821 Other seborrheic keratosis: Secondary | ICD-10-CM | POA: Diagnosis not present

## 2016-07-15 DIAGNOSIS — D235 Other benign neoplasm of skin of trunk: Secondary | ICD-10-CM | POA: Diagnosis not present

## 2016-07-15 DIAGNOSIS — D1801 Hemangioma of skin and subcutaneous tissue: Secondary | ICD-10-CM | POA: Diagnosis not present

## 2016-07-15 DIAGNOSIS — L570 Actinic keratosis: Secondary | ICD-10-CM | POA: Diagnosis not present

## 2016-07-16 ENCOUNTER — Ambulatory Visit (INDEPENDENT_AMBULATORY_CARE_PROVIDER_SITE_OTHER): Payer: PPO | Admitting: Pharmacist Clinician (PhC)/ Clinical Pharmacy Specialist

## 2016-07-16 DIAGNOSIS — I4891 Unspecified atrial fibrillation: Secondary | ICD-10-CM | POA: Diagnosis not present

## 2016-07-16 DIAGNOSIS — Z7901 Long term (current) use of anticoagulants: Secondary | ICD-10-CM

## 2016-07-16 DIAGNOSIS — I4821 Permanent atrial fibrillation: Secondary | ICD-10-CM

## 2016-07-16 DIAGNOSIS — I482 Chronic atrial fibrillation, unspecified: Secondary | ICD-10-CM

## 2016-07-16 LAB — POCT INR: INR: 2.2

## 2016-07-23 ENCOUNTER — Other Ambulatory Visit: Payer: Self-pay | Admitting: Cardiothoracic Surgery

## 2016-07-23 DIAGNOSIS — J90 Pleural effusion, not elsewhere classified: Secondary | ICD-10-CM

## 2016-07-23 DIAGNOSIS — E0580 Other thyrotoxicosis without thyrotoxic crisis or storm: Secondary | ICD-10-CM | POA: Diagnosis not present

## 2016-07-24 ENCOUNTER — Other Ambulatory Visit: Payer: Self-pay | Admitting: Pulmonary Disease

## 2016-07-24 ENCOUNTER — Ambulatory Visit (INDEPENDENT_AMBULATORY_CARE_PROVIDER_SITE_OTHER): Payer: PPO | Admitting: Cardiothoracic Surgery

## 2016-07-24 ENCOUNTER — Ambulatory Visit
Admission: RE | Admit: 2016-07-24 | Discharge: 2016-07-24 | Disposition: A | Discharge status: Home / Self Care | Payer: PPO | Source: Ambulatory Visit | Attending: Cardiothoracic Surgery | Admitting: Cardiothoracic Surgery

## 2016-07-24 ENCOUNTER — Encounter: Payer: Self-pay | Admitting: Cardiothoracic Surgery

## 2016-07-24 ENCOUNTER — Other Ambulatory Visit: Payer: Self-pay | Admitting: *Deleted

## 2016-07-24 VITALS — BP 119/73 | HR 80 | Resp 16 | Ht 69.0 in | Wt 180.0 lb

## 2016-07-24 DIAGNOSIS — J90 Pleural effusion, not elsewhere classified: Secondary | ICD-10-CM

## 2016-07-24 DIAGNOSIS — Z978 Presence of other specified devices: Secondary | ICD-10-CM | POA: Diagnosis not present

## 2016-07-24 DIAGNOSIS — Z09 Encounter for follow-up examination after completed treatment for conditions other than malignant neoplasm: Secondary | ICD-10-CM | POA: Diagnosis not present

## 2016-07-24 DIAGNOSIS — Z9689 Presence of other specified functional implants: Secondary | ICD-10-CM

## 2016-07-24 DIAGNOSIS — R0602 Shortness of breath: Secondary | ICD-10-CM | POA: Diagnosis not present

## 2016-07-24 NOTE — Progress Notes (Signed)
PCP is No PCP Per Patient Referring Provider is Juanito Doom, MD  Chief Complaint  Patient presents with  . Routine Post Op    1 month f/u to assess right pl effusion/ R pleurX with a CXR    HPI: 1 month follow-up for right Pleurx catheter Since last visit the patient has had no significant drainage from the Pleurx catheter He's had no symptoms of shortness of breath or troublesome cough He has a history of a nonmalignant right pleural effusion related to chronic heart failure, atrial fibrillation  Today's chest x-ray shows no significant change with an entrapped lower lobe and possible small loculated posterior effusion. The Pleurx catheters in good position and is not draining. There is no need to continue the Pleurx catheter placement and we will arrange for removal at [facility] short stay in a sterile procedure. He will stop his Coumadin 2 days before the procedure. Past Medical History:  Diagnosis Date  . Anxiety   . Atrial fibrillation ([place])   . Cardiomyopathy, ischemic 11/07/2013  . CHB (complete heart block) ([place]) 11/06/2013  . Chronic combined systolic and diastolic CHF (congestive heart failure) ([place])   . Colon polyps   . Colon polyps   . Congenital heart block   . Constipation   . CVA (cerebral infarction)   . Depression   . Diverticulosis   . Esophageal stricture   . Family history of adverse reaction to anesthesia   . Hyperlipidemia   . Hypertension   . Hyperthyroidism 11/16/2015  . Lymphoma ([place]) 1998   of colon   . Presence of permanent cardiac pacemaker    St. Jude  pt.states he is totally dependent on pacemaker  . Shortness of breath dyspnea    Pulmonary Effusion  . Stroke [facility])    [id number] TIA  . Umbilical hernia   . Urinary frequency     Past Surgical History:  Procedure Laterality Date  . APPENDECTOMY  1953  . CARDIAC CATHETERIZATION  01/06/2003   Recommend medical therapy  . CARDIOVASCULAR STRESS TEST  07/16/2012   Mild-moderate perfusion  defect seen in Basal inferior, Mid inferior, and Apica lateral consistent with infarct/scar. No scintigraphic evidence for inducible myocardial ischemia. No ECG changes. EKG negative for ischemia.  Marland Kitchen CAROTID DOPPLER  11/04/2012   Proximal Rt ICA 50-99% diameter reduction; Lft Bulb demonstrated mild amount homogeneous plaque-not hemodynamically significant; Lft ICA-normal patency.  . CHEST TUBE INSERTION Right 03/15/2016   Procedure: INSERTION PLEURAL DRAINAGE CATHETER;  Surgeon: Ivin Poot, MD;  Location: [place];  Service: Thoracic;  Laterality: Right;  . COLECTOMY     for lymphoma  . COLONOSCOPY    . neck fusion    . PACEMAKER GENERATOR CHANGE  01/31/2012   St Jude Med Accent DR RF model [id number] serial [id number]  . PACEMAKER PLACEMENT     replaced 3 x  . PERMANENT PACEMAKER GENERATOR CHANGE N/A 01/31/2012   Procedure: PERMANENT PACEMAKER GENERATOR CHANGE;  Surgeon: Sanda Klein, MD;  Location: [place] CATH LAB;   . PLEURADESIS N/A 04/02/2016   Procedure: PLEURADESIS;  Surgeon: Ivin Poot, MD;  Location: [facility] OR;  Service: Thoracic;  Laterality: N/A;  . TONSILLECTOMY    . TRANSTHORACIC ECHOCARDIOGRAM  11/04/2012   EF 123456, systolic function moderately reduced, mild regurg of the aortic and mitral valves.  Marland Kitchen VIDEO ASSISTED THORACOSCOPY Right 04/02/2016   Procedure: Right VIDEO ASSISTED THORACOSCOPY with Biopsies and drainage pleural effusion;  Surgeon: Ivin Poot, MD;  Location: [place];  Service: Thoracic;  Laterality: Right;    Family History  Problem Relation Age of Onset  . Prostate cancer Father   . Colon cancer Neg Hx   . Diabetes Maternal Grandmother   . Heart disease Mother   . Stroke Mother   . Heart attack Neg Hx     Social History Social History  Substance Use Topics  . Smoking status: Never Smoker  . Smokeless tobacco: Never Used  . Alcohol use No    Current Outpatient Prescriptions  Medication Sig Dispense Refill  . acetaminophen (TYLENOL) 500 MG tablet Take 500  mg by mouth every 6 (six) hours as needed (For pain.).    Marland Kitchen furosemide (LASIX) 20 MG tablet Take 1 tablet (20 mg total) by mouth daily. 30 tablet 5  . hydrocortisone cream 1 % Apply 1 application topically 2 (two) times daily as needed for itching.     . methimazole (TAPAZOLE) 10 MG tablet Take 60 mg by mouth daily.    . metoprolol succinate (TOPROL-XL) 25 MG 24 hr tablet Take 1 tablet (25 mg total) by mouth daily. 30 tablet 11  . pravastatin (PRAVACHOL) 80 MG tablet TAKE 1 TABLET BY MOUTH EVERY DAY (Patient taking differently: TAKE 1 TABLET BY MOUTH EVERY DAY AT 8PM.) 90 tablet 3  . Tamsulosin HCl (FLOMAX) 0.4 MG CAPS Take 0.4 mg by mouth at bedtime.     . traMADol (ULTRAM) 50 MG tablet Take 1 tablet (50 mg total) by mouth every 6 (six) hours as needed. 40 tablet 0  . warfarin (COUMADIN) 2.5 MG tablet Take 1/2 -1 tablet by mouth daily as directed by coumadin clinic (Patient taking differently: Take 1.25-2.5 mg by mouth daily at 6 PM. Take half a tablet (1.25mg ) on Tuesday, Thursday, Sunday and one tablet (2.5mg ) the rest of the week.) 90 tablet 1   No current facility-administered medications for this visit.     Allergies  Allergen Reactions  . Statins Other (See Comments)    Leg cramps.   Tolerates pravastatin.    . Latex Other (See Comments)    Skin irritation  . Adhesive [Tape] Other (See Comments)    Skin irritation - please use paper tape  . Penicillins Rash and Other (See Comments)    Has patient had a PCN reaction causing immediate rash, facial/tongue/throat swelling, SOB or lightheadedness with hypotension: Yes Has patient had a PCN reaction causing severe rash involving mucus membranes or skin necrosis: No Has patient had a PCN reaction that required hospitalization No Has patient had a PCN reaction occurring within the last 10 years: No If all of the above answers are "NO", then may proceed with Cephalosporin use.    Review of Systems      Weight stable no fever      No  angina      No bleeding, cage from his Coumadin      Heart failure well-controlled by medications-recently was seen by his cardiologist Dr. Sallyanne Kuster  Review of Systems :  [ y ] = yes, [  ] = no        General :  Weight gain [   ]    Weight loss  [   ]  Fatigue [ y ]  Fever [  ]  Chills  [  ]                                Weakness  [  ]  HEENT    Headache [  ]  Dizziness [  ]  Blurred vision [  ] Glaucoma  [  ]                          Nosebleeds [  ] Painful or loose teeth [  ]        Cardiac :  Chest pain/ pressure [  ]  Resting SOB [  ] exertional SOB [  ]                        Orthopnea [  ]  Pedal edema  [  ]  Palpitations [  ] Syncope/presyncope [ ]                         Paroxysmal nocturnal dyspnea [  ]         Pulmonary : cough [ minimal ]  wheezing [  ]  Hemoptysis [  ] Sputum [  ] Snoring [  ]                              Pneumothorax [  ]  Sleep apnea [  ]        GI : Vomiting [  ]  Dysphagia [  ]  Melena  [  ]  Abdominal pain [  ] BRBPR [  ]              Heart burn [  ]  Constipation [  ] Diarrhea  [  ] Colonoscopy [   ]        GU : Hematuria [  ]  Dysuria [  ]  Nocturia [  ] UTI's [  ]        Vascular : Claudication [  ]  Rest pain [  ]  DVT [  ] Vein stripping [  ] leg ulcers [  ]                          TIA [  ] Stroke [  ]  Varicose veins [  ]        NEURO :  Headaches  [  ] Seizures [  ] Vision changes [  ] Paresthesias [  ]                                       Seizures [  ]        Musculoskeletal :  Arthritis [  ] Gout  [  ]  Back pain [chronic right-sided  ]  Joint pain [  ]        Skin :  Rash [  ]  Melanoma [  ] Sores [  ]        Heme : Bleeding problems [  ]Clotting Disorders [  ] Anemia [  ]Blood Transfusion [ ]         Endocrine : Diabetes [  ] Heat or Cold intolerance [  ] Polyuria [  ]excessive thirst [ ]         Psych : Depression [  ]  Anxiety [  ]  Psych hospitalizations [  ] Memory change [  ]  BP 119/73   Pulse 80   Resp 16   Ht 5\' 9"  (1.753 m)   Wt 180 lb (81.6 kg)   SpO2 98% Comment: ON RA  BMI 26.58 kg/m  Physical Exam     Physical Exam  General: Middle-aged Caucasian male no acute distress HEENT: Normocephalic pupils equal , dentition adequate Neck: Supple without JVD, adenopathy, or bruit Chest: Clear with slightly diminished breath sounds on right posteriorly, , no rhonchi, no tenderness             or deformity. Pleurx catheter exit site clean and dry Cardiovascular: Atrial fibrillation irregular rhythm, no murmur, no gallop, peripheral pulses             palpable in all extremities Abdomen:  Soft, nontender, no palpable mass or organomegaly Extremities: Warm, well-perfused, no clubbing cyanosis edema or tenderness,              3+ venous stasis changes of the legs Rectal/GU: Deferred Neuro: Grossly non--focal and symmetrical throughout Skin: Clean and dry without rash or ulceration   Diagnostic Tests: Chest x-ray taken today shows chronic changes of entrapped right lower lobe and loculated small effusion. Pleurx catheter good position. No evidence of a free pleural effusion  Impression: Pleurx catheter has succeeded in reducing the right pleural effusion secondary to his chronic heart failure. It is no longer draining and is no longer needed.  Plan: Pleurx catheter removal at [facility] stay on October 13. Procedure discussed with patient he understands and agrees.   Len Childs, MD [facility] [phone]

## 2016-07-24 NOTE — Telephone Encounter (Signed)
Pt requesting refill on Trazadone.   OK to refill?

## 2016-07-26 ENCOUNTER — Encounter (HOSPITAL_COMMUNITY)
Admission: RE | Disposition: A | Discharge status: Home / Self Care | Payer: Self-pay | Source: Ambulatory Visit | Attending: Cardiothoracic Surgery

## 2016-07-26 ENCOUNTER — Ambulatory Visit (HOSPITAL_COMMUNITY)
Admission: RE | Admit: 2016-07-26 | Discharge: 2016-07-26 | Disposition: A | Discharge status: Home / Self Care | Payer: PPO | Source: Ambulatory Visit | Attending: Cardiothoracic Surgery | Admitting: Cardiothoracic Surgery

## 2016-07-26 DIAGNOSIS — J90 Pleural effusion, not elsewhere classified: Secondary | ICD-10-CM | POA: Diagnosis not present

## 2016-07-26 DIAGNOSIS — E785 Hyperlipidemia, unspecified: Secondary | ICD-10-CM | POA: Insufficient documentation

## 2016-07-26 DIAGNOSIS — Z79899 Other long term (current) drug therapy: Secondary | ICD-10-CM | POA: Diagnosis not present

## 2016-07-26 DIAGNOSIS — I442 Atrioventricular block, complete: Secondary | ICD-10-CM | POA: Insufficient documentation

## 2016-07-26 DIAGNOSIS — Z7901 Long term (current) use of anticoagulants: Secondary | ICD-10-CM | POA: Diagnosis not present

## 2016-07-26 DIAGNOSIS — I255 Ischemic cardiomyopathy: Secondary | ICD-10-CM | POA: Insufficient documentation

## 2016-07-26 DIAGNOSIS — E0590 Thyrotoxicosis, unspecified without thyrotoxic crisis or storm: Secondary | ICD-10-CM | POA: Insufficient documentation

## 2016-07-26 DIAGNOSIS — Z8673 Personal history of transient ischemic attack (TIA), and cerebral infarction without residual deficits: Secondary | ICD-10-CM | POA: Insufficient documentation

## 2016-07-26 DIAGNOSIS — I4891 Unspecified atrial fibrillation: Secondary | ICD-10-CM | POA: Insufficient documentation

## 2016-07-26 DIAGNOSIS — I5042 Chronic combined systolic (congestive) and diastolic (congestive) heart failure: Secondary | ICD-10-CM | POA: Diagnosis not present

## 2016-07-26 DIAGNOSIS — Z950 Presence of cardiac pacemaker: Secondary | ICD-10-CM | POA: Insufficient documentation

## 2016-07-26 DIAGNOSIS — Z4682 Encounter for fitting and adjustment of non-vascular catheter: Secondary | ICD-10-CM | POA: Diagnosis not present

## 2016-07-26 HISTORY — PX: REMOVAL OF PLEURAL DRAINAGE CATHETER: SHX5080

## 2016-07-26 LAB — PROTIME-INR
INR: 1.44
Prothrombin Time: 17.7 seconds — ABNORMAL HIGH (ref 11.4–15.2)

## 2016-07-26 SURGERY — REMOVAL, CLOSED DRAINAGE CATHETER SYSTEM, PLEURAL
Anesthesia: Monitor Anesthesia Care | Laterality: Right

## 2016-07-26 MED ORDER — LIDOCAINE HCL (PF) 1 % IJ SOLN
INTRAMUSCULAR | Status: AC
  Filled 2016-07-26: qty 5

## 2016-07-26 MED ORDER — LIDOCAINE HCL (PF) 1 % IJ SOLN
INTRAMUSCULAR | Status: AC
  Filled 2016-07-26: qty 30

## 2016-07-26 NOTE — Progress Notes (Signed)
      [address]       [place] [address]             [phone]     Brief Procedure Note:   Right pleurx catheter removed per usual technique  8cc 15 local lidocaine  Betadine prep/sterile technique  Catheter removed intact  Patient tolerated well  #1 3-0 nylon suture placed   Office will remove suture    GOLD,WAYNE E, PA-C

## 2016-07-29 ENCOUNTER — Encounter (HOSPITAL_COMMUNITY): Payer: Self-pay | Admitting: Cardiothoracic Surgery

## 2016-07-30 ENCOUNTER — Ambulatory Visit (INDEPENDENT_AMBULATORY_CARE_PROVIDER_SITE_OTHER): Payer: PPO | Admitting: Pharmacist

## 2016-07-30 DIAGNOSIS — I482 Chronic atrial fibrillation, unspecified: Secondary | ICD-10-CM

## 2016-07-30 DIAGNOSIS — E785 Hyperlipidemia, unspecified: Secondary | ICD-10-CM | POA: Diagnosis not present

## 2016-07-30 DIAGNOSIS — I4891 Unspecified atrial fibrillation: Secondary | ICD-10-CM

## 2016-07-30 DIAGNOSIS — Z7901 Long term (current) use of anticoagulants: Secondary | ICD-10-CM

## 2016-07-30 DIAGNOSIS — Z79899 Other long term (current) drug therapy: Secondary | ICD-10-CM | POA: Diagnosis not present

## 2016-07-30 DIAGNOSIS — I4821 Permanent atrial fibrillation: Secondary | ICD-10-CM

## 2016-07-30 LAB — LIPID PANEL
CHOL/HDL RATIO: 4.6 ratio (ref <=5.0)
Cholesterol: 182 mg/dL (ref 125–200)
HDL: 40 mg/dL (ref >=40)
LDL Cholesterol: 129 mg/dL (ref <130)
Triglycerides: 63 mg/dL (ref <150)
VLDL: 13 mg/dL (ref <30)

## 2016-07-30 LAB — POCT INR: INR: 1.4

## 2016-08-05 ENCOUNTER — Encounter (INDEPENDENT_AMBULATORY_CARE_PROVIDER_SITE_OTHER): Payer: Self-pay

## 2016-08-05 DIAGNOSIS — J90 Pleural effusion, not elsewhere classified: Secondary | ICD-10-CM

## 2016-08-05 DIAGNOSIS — Z4802 Encounter for removal of sutures: Secondary | ICD-10-CM

## 2016-08-06 ENCOUNTER — Other Ambulatory Visit: Payer: Self-pay | Admitting: Cardiothoracic Surgery

## 2016-08-06 DIAGNOSIS — J90 Pleural effusion, not elsewhere classified: Secondary | ICD-10-CM

## 2016-08-07 ENCOUNTER — Ambulatory Visit (INDEPENDENT_AMBULATORY_CARE_PROVIDER_SITE_OTHER): Payer: PPO | Admitting: Cardiothoracic Surgery

## 2016-08-07 ENCOUNTER — Ambulatory Visit
Admission: RE | Admit: 2016-08-07 | Discharge: 2016-08-07 | Disposition: A | Discharge status: Home / Self Care | Payer: PPO | Source: Ambulatory Visit | Attending: Cardiothoracic Surgery | Admitting: Cardiothoracic Surgery

## 2016-08-07 ENCOUNTER — Encounter: Payer: Self-pay | Admitting: Cardiothoracic Surgery

## 2016-08-07 VITALS — BP 110/71 | HR 72 | Resp 16 | Ht 69.0 in | Wt 180.0 lb

## 2016-08-07 DIAGNOSIS — J90 Pleural effusion, not elsewhere classified: Secondary | ICD-10-CM

## 2016-08-07 DIAGNOSIS — Z09 Encounter for follow-up examination after completed treatment for conditions other than malignant neoplasm: Secondary | ICD-10-CM | POA: Diagnosis not present

## 2016-08-07 NOTE — Progress Notes (Signed)
PCP is No PCP Per Patient Referring Provider is Juanito Doom, MD  Chief Complaint  Patient presents with  . Routine Post Op    s/p RpleurX removal with a CXR    [id number] check after removal right Pleurx catheter. The patient has had history of chronic right pleural effusion from his diastolic heart failure and he also has history of lymphoma. The fluid was nonmalignant. With catheter drainage of 5 months the effusion resolved. Chest x-ray today shows some chronic loculation but overall improved aeration of the right lung. He denies shortness of breath. He is still sore with Pleurx catheter was removed. Incision is clean and dry.   Past Medical History:  Diagnosis Date  . Anxiety   . Atrial fibrillation ([place])   . Cardiomyopathy, ischemic 11/07/2013  . CHB (complete heart block) ([place]) 11/06/2013  . Chronic combined systolic and diastolic CHF (congestive heart failure) ([place])   . Colon polyps   . Colon polyps   . Congenital heart block   . Constipation   . CVA (cerebral infarction)   . Depression   . Diverticulosis   . Esophageal stricture   . Family history of adverse reaction to anesthesia   . Hyperlipidemia   . Hypertension   . Hyperthyroidism 11/16/2015  . Lymphoma ([place]) 1998   of colon   . Presence of permanent cardiac pacemaker    St. Jude  pt.states he is totally dependent on pacemaker  . Shortness of breath dyspnea    Pulmonary Effusion  . Stroke [facility])    [id number] TIA  . Umbilical hernia   . Urinary frequency     Past Surgical History:  Procedure Laterality Date  . APPENDECTOMY  1953  . CARDIAC CATHETERIZATION  01/06/2003   Recommend medical therapy  . CARDIOVASCULAR STRESS TEST  07/16/2012   Mild-moderate perfusion defect seen in Basal inferior, Mid inferior, and Apica lateral consistent with infarct/scar. No scintigraphic evidence for inducible myocardial ischemia. No ECG changes. EKG negative for ischemia.  Marland Kitchen CAROTID DOPPLER  11/04/2012   Proximal Rt ICA  50-99% diameter reduction; Lft Bulb demonstrated mild amount homogeneous plaque-not hemodynamically significant; Lft ICA-normal patency.  . CHEST TUBE INSERTION Right 03/15/2016   Procedure: INSERTION PLEURAL DRAINAGE CATHETER;  Surgeon: Ivin Poot, MD;  Location: [place];  Service: Thoracic;  Laterality: Right;  . COLECTOMY     for lymphoma  . COLONOSCOPY    . neck fusion    . PACEMAKER GENERATOR CHANGE  01/31/2012   St Jude Med Accent DR RF model [id number] serial [id number]  . PACEMAKER PLACEMENT     replaced 3 x  . PERMANENT PACEMAKER GENERATOR CHANGE N/A 01/31/2012   Procedure: PERMANENT PACEMAKER GENERATOR CHANGE;  Surgeon: Sanda Klein, MD;  Location: [place] CATH LAB;   . PLEURADESIS N/A 04/02/2016   Procedure: PLEURADESIS;  Surgeon: Ivin Poot, MD;  Location: [place];  Service: Thoracic;  Laterality: N/A;  . REMOVAL OF PLEURAL DRAINAGE CATHETER Right 07/26/2016   Procedure: REMOVAL OF PLEURAL DRAINAGE CATHETER;  Surgeon: Ivin Poot, MD;  Location: [place];  Service: Thoracic;  Laterality: Right;  . TONSILLECTOMY    . TRANSTHORACIC ECHOCARDIOGRAM  11/04/2012   EF 123456, systolic function moderately reduced, mild regurg of the aortic and mitral valves.  Marland Kitchen VIDEO ASSISTED THORACOSCOPY Right 04/02/2016   Procedure: Right VIDEO ASSISTED THORACOSCOPY with Biopsies and drainage pleural effusion;  Surgeon: Ivin Poot, MD;  Location: [place];  Service: Thoracic;  Laterality: Right;    Family  History  Problem Relation Age of Onset  . Prostate cancer Father   . Colon cancer Neg Hx   . Diabetes Maternal Grandmother   . Heart disease Mother   . Stroke Mother   . Heart attack Neg Hx     Social History Social History  Substance Use Topics  . Smoking status: Never Smoker  . Smokeless tobacco: Never Used  . Alcohol use No    Current Outpatient Prescriptions  Medication Sig Dispense Refill  . acetaminophen (TYLENOL) 500 MG tablet Take 1,000 mg by mouth every 6 (six) hours as needed for  moderate pain.     Marland Kitchen docusate sodium (COLACE) 100 MG capsule Take 100 mg by mouth daily as needed for mild constipation.    . furosemide (LASIX) 20 MG tablet Take 1 tablet (20 mg total) by mouth daily. 30 tablet 5  . hydrocortisone cream 1 % Apply 1 application topically 2 (two) times daily as needed for itching.     . metoprolol succinate (TOPROL-XL) 25 MG 24 hr tablet Take 1 tablet (25 mg total) by mouth daily. 30 tablet 11  . pravastatin (PRAVACHOL) 80 MG tablet TAKE 1 TABLET BY MOUTH EVERY DAY (Patient taking differently: TAKE 1 TABLET BY MOUTH EVERY DAY AT 8PM.) 90 tablet 3  . Tamsulosin HCl (FLOMAX) 0.4 MG CAPS Take 0.4 mg by mouth at bedtime.     . traMADol (ULTRAM) 50 MG tablet Take 1 tablet (50 mg total) by mouth every 6 (six) hours as needed. (Patient taking differently: Take 50 mg by mouth every 6 (six) hours as needed for moderate pain. ) 40 tablet 0  . warfarin (COUMADIN) 2.5 MG tablet Take 1/2 -1 tablet by mouth daily as directed by coumadin clinic (Patient taking differently: Take 2.5 mg by mouth daily at 6 PM. ) 90 tablet 1   No current facility-administered medications for this visit.     Allergies  Allergen Reactions  . Statins Other (See Comments)    Leg cramps.   Tolerates pravastatin.    . Latex Other (See Comments)    Skin irritation  . Adhesive [Tape] Other (See Comments)    Skin irritation - please use paper tape  . Penicillins Rash    Has patient had a PCN reaction causing immediate rash, facial/tongue/throat swelling, SOB or lightheadedness with hypotension: Yes Has patient had a PCN reaction causing severe rash involving mucus membranes or skin necrosis: No Has patient had a PCN reaction that required hospitalization No Has patient had a PCN reaction occurring within the last 10 years: No If all of the above answers are "NO", then may proceed with Cephalosporin use.    Review of Systems  No cough No shortness of breath No fever No edema  BP 110/71    Pulse 72   Resp 16   Ht 5\' 9"  (1.753 m)   Wt 180 lb (81.6 kg)   SpO2 97% Comment: ON RA  BMI 26.58 kg/m  Physical Exam Breath sounds slightly diminished at right base Pleurx catheter site clean and dry  heart rate irregular consistent with A. fib  Diagnostic Tests: Chest x-ray good aeration of right lung without Pleurx catheter in place  Impression: Resolution of pleural effusion with Pleurx catheter  Plan: Return as needed If he develops recurrent symptoms-cough, shortness of breath-he needs a chest x-ray  Len Childs, MD [facility] [phone]

## 2016-08-15 ENCOUNTER — Ambulatory Visit (INDEPENDENT_AMBULATORY_CARE_PROVIDER_SITE_OTHER): Payer: PPO | Admitting: Pharmacist Clinician (PhC)/ Clinical Pharmacy Specialist

## 2016-08-15 DIAGNOSIS — I482 Chronic atrial fibrillation, unspecified: Secondary | ICD-10-CM

## 2016-08-15 DIAGNOSIS — Z7901 Long term (current) use of anticoagulants: Secondary | ICD-10-CM | POA: Diagnosis not present

## 2016-08-15 DIAGNOSIS — I4891 Unspecified atrial fibrillation: Secondary | ICD-10-CM

## 2016-08-15 DIAGNOSIS — I4821 Permanent atrial fibrillation: Secondary | ICD-10-CM

## 2016-08-15 LAB — POCT INR: INR: 1.9

## 2016-08-21 DIAGNOSIS — Z5181 Encounter for therapeutic drug level monitoring: Secondary | ICD-10-CM | POA: Diagnosis not present

## 2016-08-21 DIAGNOSIS — R946 Abnormal results of thyroid function studies: Secondary | ICD-10-CM | POA: Diagnosis not present

## 2016-08-21 DIAGNOSIS — E049 Nontoxic goiter, unspecified: Secondary | ICD-10-CM | POA: Diagnosis not present

## 2016-08-21 DIAGNOSIS — E0580 Other thyrotoxicosis without thyrotoxic crisis or storm: Secondary | ICD-10-CM | POA: Diagnosis not present

## 2016-08-27 ENCOUNTER — Ambulatory Visit (INDEPENDENT_AMBULATORY_CARE_PROVIDER_SITE_OTHER): Payer: PPO | Admitting: Pharmacist Clinician (PhC)/ Clinical Pharmacy Specialist

## 2016-08-27 DIAGNOSIS — I482 Chronic atrial fibrillation, unspecified: Secondary | ICD-10-CM

## 2016-08-27 DIAGNOSIS — I4891 Unspecified atrial fibrillation: Secondary | ICD-10-CM

## 2016-08-27 DIAGNOSIS — Z7901 Long term (current) use of anticoagulants: Secondary | ICD-10-CM

## 2016-08-27 DIAGNOSIS — I4821 Permanent atrial fibrillation: Secondary | ICD-10-CM

## 2016-08-27 LAB — POCT INR: INR: 1.9

## 2016-09-10 ENCOUNTER — Ambulatory Visit (INDEPENDENT_AMBULATORY_CARE_PROVIDER_SITE_OTHER): Payer: PPO | Admitting: *Deleted

## 2016-09-10 ENCOUNTER — Encounter: Payer: Self-pay | Admitting: Family

## 2016-09-10 ENCOUNTER — Ambulatory Visit (INDEPENDENT_AMBULATORY_CARE_PROVIDER_SITE_OTHER)
Admission: RE | Admit: 2016-09-10 | Discharge: 2016-09-10 | Disposition: A | Discharge status: Home / Self Care | Payer: PPO | Source: Ambulatory Visit | Attending: Family | Admitting: Family

## 2016-09-10 ENCOUNTER — Ambulatory Visit (INDEPENDENT_AMBULATORY_CARE_PROVIDER_SITE_OTHER): Payer: PPO | Admitting: Family

## 2016-09-10 VITALS — BP 130/76 | HR 71 | Temp 97.6°F | Resp 16 | Ht 69.0 in | Wt 188.0 lb

## 2016-09-10 DIAGNOSIS — Z23 Encounter for immunization: Secondary | ICD-10-CM

## 2016-09-10 DIAGNOSIS — M7581 Other shoulder lesions, right shoulder: Secondary | ICD-10-CM

## 2016-09-10 DIAGNOSIS — M7580 Other shoulder lesions, unspecified shoulder: Secondary | ICD-10-CM | POA: Insufficient documentation

## 2016-09-10 DIAGNOSIS — I442 Atrioventricular block, complete: Secondary | ICD-10-CM

## 2016-09-10 DIAGNOSIS — M19011 Primary osteoarthritis, right shoulder: Secondary | ICD-10-CM | POA: Diagnosis not present

## 2016-09-10 NOTE — Assessment & Plan Note (Signed)
Symptoms and exam concerning for rotator cuff tendinitis/impingement. Treat conservatively with ice, home exercise therapy, and over-the-counter medications as needed for symptom relief and supportive care. Obtain x-rays to rule out calcific tendinitis or underlying structural abnormalities. If symptoms worsen or do not improve consider cortisone injection, physical therapy and possible additional imaging if needed.

## 2016-09-10 NOTE — Progress Notes (Signed)
Subjective:    Patient ID: Benjamin Mckay, male    DOB: Mar 02, 1946, 70 y.o.   MRN: [id number]  Chief Complaint  Patient presents with  . Establish Care    right arm pain, from neck area to elbow, does not know of anything that has caused it, x1 month but has gotten worse over the last week and a half    HPI:  Benjamin Mckay is a 70 y.o. male who  has a past medical history of Anxiety; Atrial fibrillation ([place]); Cardiomyopathy, ischemic (11/07/2013); Cataract; CHB (complete heart block) ([place]) (11/06/2013); Chronic combined systolic and diastolic CHF (congestive heart failure) ([place]); Colon polyps; Colon polyps; Congenital heart block; Constipation; CVA (cerebral infarction); Depression; Diverticulosis; Esophageal stricture; Family history of adverse reaction to anesthesia; Hyperlipidemia; Hypertension; Hyperthyroidism (11/16/2015); Lymphoma ([place]) (1998); Presence of permanent cardiac pacemaker; Shortness of breath dyspnea; Stroke [facility]); Umbilical hernia; and Urinary frequency. and presents today for an office visit to establish care.  Right arm pain - This is a new problem. Associated symptom of pain located in his right upper extremity from his neck to his elbow that has been going on for about 1 month and over the past 1.5 weeks has progressive worsened. Pain is described as achy and occasionally sharp. Describes difficulty picking things up. Right hand dominant. Denies any trauma or specific injury that he can recall. Severity is enough to disturb sleep. Timing of the symptoms is generally constant. Modifying factors include Tylenol and a heating pad which have not helped very much. No previous injury to shoulder. Did have a neck fusion about 20 years ago.     Allergies  Allergen Reactions  . Statins Other (See Comments)    Leg cramps.   Tolerates pravastatin.    . Latex Other (See Comments)    Skin irritation  . Adhesive [Tape] Other (See Comments)    Skin irritation - please use paper tape   . Penicillins Rash    Has patient had a PCN reaction causing immediate rash, facial/tongue/throat swelling, SOB or lightheadedness with hypotension: Yes Has patient had a PCN reaction causing severe rash involving mucus membranes or skin necrosis: No Has patient had a PCN reaction that required hospitalization No Has patient had a PCN reaction occurring within the last 10 years: No If all of the above answers are "NO", then may proceed with Cephalosporin use.      Outpatient Medications Prior to Visit  Medication Sig Dispense Refill  . furosemide (LASIX) 20 MG tablet Take 1 tablet (20 mg total) by mouth daily. 30 tablet 5  . metoprolol succinate (TOPROL-XL) 25 MG 24 hr tablet Take 1 tablet (25 mg total) by mouth daily. 30 tablet 11  . pravastatin (PRAVACHOL) 80 MG tablet TAKE 1 TABLET BY MOUTH EVERY DAY (Patient taking differently: TAKE 1 TABLET BY MOUTH EVERY DAY AT 8PM.) 90 tablet 3  . Tamsulosin HCl (FLOMAX) 0.4 MG CAPS Take 0.4 mg by mouth at bedtime.     Marland Kitchen warfarin (COUMADIN) 2.5 MG tablet Take 1/2 -1 tablet by mouth daily as directed by coumadin clinic (Patient taking differently: Take 2.5 mg by mouth daily at 6 PM. ) 90 tablet 1  . acetaminophen (TYLENOL) 500 MG tablet Take 1,000 mg by mouth every 6 (six) hours as needed for moderate pain.     Marland Kitchen docusate sodium (COLACE) 100 MG capsule Take 100 mg by mouth daily as needed for mild constipation.    . hydrocortisone cream 1 % Apply 1 application  topically 2 (two) times daily as needed for itching.     . traMADol (ULTRAM) 50 MG tablet Take 1 tablet (50 mg total) by mouth every 6 (six) hours as needed. (Patient taking differently: Take 50 mg by mouth every 6 (six) hours as needed for moderate pain. ) 40 tablet 0   No facility-administered medications prior to visit.      Past Medical History:  Diagnosis Date  . Anxiety   . Atrial fibrillation ([place])   . Cardiomyopathy, ischemic 11/07/2013  . Cataract   . CHB (complete heart block)  ([place]) 11/06/2013  . Chronic combined systolic and diastolic CHF (congestive heart failure) ([place])   . Colon polyps   . Colon polyps   . Congenital heart block   . Constipation   . CVA (cerebral infarction)   . Depression   . Diverticulosis   . Esophageal stricture   . Family history of adverse reaction to anesthesia   . Hyperlipidemia   . Hypertension   . Hyperthyroidism 11/16/2015  . Lymphoma ([place]) 1998   of colon   . Presence of permanent cardiac pacemaker    [employer]  pt.states he is totally dependent on pacemaker  . Shortness of breath dyspnea    Pulmonary Effusion  . Stroke [facility])    [id number] [id number]  . Umbilical hernia   . Urinary frequency       Past Surgical History:  Procedure Laterality Date  . APPENDECTOMY  1953  . CARDIAC CATHETERIZATION  01/06/2003   Recommend medical therapy  . CARDIOVASCULAR STRESS TEST  07/16/2012   Mild-moderate perfusion defect seen in Basal inferior, Mid inferior, and Apica lateral consistent with infarct/scar. No scintigraphic evidence for inducible myocardial ischemia. No ECG changes. EKG negative for ischemia.  Marland Kitchen CAROTID DOPPLER  11/04/2012   Proximal Rt ICA 50-99% diameter reduction; Lft Bulb demonstrated mild amount homogeneous plaque-not hemodynamically significant; Lft ICA-normal patency.  . CHEST TUBE INSERTION Right 03/15/2016   Procedure: INSERTION PLEURAL DRAINAGE CATHETER;  Surgeon: Ivin Poot, MD;  Location: [place];  Service: Thoracic;  Laterality: Right;  . COLECTOMY     for lymphoma  . COLONOSCOPY    . neck fusion    . PACEMAKER GENERATOR CHANGE  01/31/2012   [employer] Jude [employer] Accent DR RF model [id number] serial [id number]  . PACEMAKER PLACEMENT     replaced 3 x  . PERMANENT PACEMAKER GENERATOR CHANGE N/A 01/31/2012   Procedure: PERMANENT PACEMAKER GENERATOR CHANGE;  Surgeon: Sanda Klein, MD;  Location: [place] CATH LAB;   . PLEURADESIS N/A 04/02/2016   Procedure: PLEURADESIS;  Surgeon: Ivin Poot, MD;  Location: [place];  Service:  Thoracic;  Laterality: N/A;  . REMOVAL OF PLEURAL DRAINAGE CATHETER Right 07/26/2016   Procedure: REMOVAL OF PLEURAL DRAINAGE CATHETER;  Surgeon: Ivin Poot, MD;  Location: [place];  Service: Thoracic;  Laterality: Right;  . TONSILLECTOMY    . TRANSTHORACIC ECHOCARDIOGRAM  11/04/2012   EF [id number], systolic function moderately reduced, mild regurg of the aortic and mitral valves.  Marland Kitchen VIDEO ASSISTED THORACOSCOPY Right 04/02/2016   Procedure: Right VIDEO ASSISTED THORACOSCOPY with Biopsies and drainage pleural effusion;  Surgeon: Ivin Poot, MD;  Location: [facility] OR;  Service: Thoracic;  Laterality: Right;      Family History  Problem Relation Age of Onset  . Prostate cancer Father   . Heart disease Mother   . Stroke Mother   . Diabetes Maternal Grandmother   . Colon cancer Neg Hx   .  Heart attack Neg Hx       Social History   Social History  . Marital status: Married    Spouse name: Chartered certified accountant  . Number of children: 3  . Years of education: 22   Occupational History  . Retired    Social History Main Topics  . Smoking status: Never Smoker  . Smokeless tobacco: Never Used  . Alcohol use No  . Drug use: No  . Sexual activity: Not on file   Other Topics Concern  . Not on file   Social History Narrative   Fun: Golf when he can   Consumes caffeine 2 cups per day        Review of Systems  Constitutional: Negative for chills and fever.  Musculoskeletal:       Positive for right shoulder/arm pain.  Neurological: Positive for weakness. Negative for numbness.       Objective:    BP 130/76 (BP Location: Left Arm, Patient Position: Sitting, Cuff Size: Normal)   Pulse 71   Temp 97.6 F (36.4 C) (Oral)   Resp 16   Ht 5\' 9"  (1.753 m)   Wt 188 lb (85.3 kg)   SpO2 97%   BMI 27.76 kg/m  Nursing note and vital signs reviewed.  Physical Exam  Constitutional: He is oriented to person, place, and time. He appears well-developed and well-nourished. No distress.    Cardiovascular: Normal rate, regular rhythm, normal heart sounds and intact distal pulses.   Pulmonary/Chest: Effort normal and breath sounds normal.  Musculoskeletal:  Right shoulder - no obvious deformity, discoloration, or edema. Palpable tenderness along supraspinatus tendon and proximal deltoid. Range of motion appears within normal limits with discomfort in flexion and abduction greater than 120. Distal pulses and sensation are intact and appropriate. Positive Michel Bickers; positive Neer's impingement; uncomfortable empty can.  Neurological: He is alert and oriented to person, place, and time.  Skin: Skin is warm and dry.  Psychiatric: He has a normal mood and affect. His behavior is normal. Judgment and thought content normal.       Assessment & Plan:   Problem List Items Addressed This Visit      Musculoskeletal and Integument   Rotator cuff tendinitis - Primary    Symptoms and exam concerning for rotator cuff tendinitis/impingement. Treat conservatively with ice, home exercise therapy, and over-the-counter medications as needed for symptom relief and supportive care. Obtain x-rays to rule out calcific tendinitis or underlying structural abnormalities. If symptoms worsen or do not improve consider cortisone injection, physical therapy and possible additional imaging if needed.      Relevant Orders   DG Shoulder Right (Completed)    Other Visit Diagnoses    Need for vaccination with 13-polyvalent pneumococcal conjugate vaccine       Relevant Orders   Pneumococcal conjugate vaccine 13-valent IM (Completed)       I have discontinued Mr. Winborne hydrocortisone cream and traMADol. I am also having him maintain his tamsulosin, metoprolol succinate, pravastatin, warfarin, acetaminophen, furosemide, and docusate sodium.   Follow-up: Return in about 1 month (around 10/10/2016), or if symptoms worsen or fail to improve.  Mauricio Po, FNP

## 2016-09-10 NOTE — Patient Instructions (Signed)
Thank you for choosing [facility].  SUMMARY AND INSTRUCTIONS:  Ice x 20 minutes every 2 hours as needed and after activity and before bed.   Tylenol as needed for discomfort.  Icy/Hot, Biofreeze or other skin topicals as needed.  Solanpas if needed.  Imaging / Radiology:  Please stop by radiology on the basement level of the building for your x-rays. Your results will be released to [place] (or called to you) after review, usually within 72 hours after test completion. If any treatments or changes are necessary, you will be notified at that same time.  Follow up:  If your symptoms worsen or fail to improve, please contact our office for further instruction, or in case of emergency go directly to the emergency room at the closest medical facility.      Rotator Cuff Tear Rehab After Surgery Ask your health care provider which exercises are safe for you. Do exercises exactly as told by your health care provider and adjust them as directed. It is normal to feel mild stretching, pulling, tightness, or discomfort as you do these exercises, but you should stop right away if you feel sudden pain or your pain gets worse. Do not begin these exercises until told by your health care provider. Stretching and range of motion exercises These exercises warm up your muscles and joints and improve the movement and flexibility of your shoulder. These exercises also help to relieve pain, numbness, and tingling. Exercise A: Pendulum 1. Stand near a wall or a surface that you can hold onto for balance. 2. Bend at the waist and let your left / right arm hang straight down. Use your other arm to keep your balance. 3. Relax your arm and shoulder muscles, and move your hips and your trunk so your left / right arm swings freely. Your arm should swing because of the motion of your body, not because you are using your arm or shoulder muscles. 4. Keep moving so your arm swings in the following directions,  as told by your health care provider:  Side to side.  Forward and backward.  In clockwise and counterclockwise circles. Repeat __________ times, or for __________ seconds per direction. Complete this exercise __________ times a day. Exercise B: Flexion, seated 1. Sit in a stable chair so your left / right forearm can rest on a flat surface. Your elbow should rest at a height that keeps your upper arm next to your body. 2. Keeping your shoulder relaxed, lean forward at the waist and let your hand slide forward. Stop when you feel a stretch in your shoulder, or when you reach the angle that is recommended by your health care provider. 3. Hold for __________ seconds. 4. Slowly return to the starting position. Repeat __________ times. Complete this exercise __________ times a day. Exercise C: Flexion, standing 1. Stand and hold a broomstick, a cane, or a similar object. Place your hands a little more than shoulder-width apart on the object. Your left / right hand should be palm-up, and your other hand should be palm-down. 2. Push the stick down with your healthy arm to raise your left / right arm in front of your body, and then over your head. Use your other hand to help move the stick. Stop when you feel a stretch in your shoulder, or when you reach the angle that is recommended by your health care provider.  Avoid shrugging your shoulder while you raise your arm. Keep your shoulder blade tucked down toward your  spine.  Keep your left / right shoulder muscles relaxed. 3. Hold for __________ seconds. 4. Slowly return to the starting position. Repeat __________ times. Complete this exercise __________ times a day. Exercise D: Abduction, supine 1. Lie on your back and hold a broomstick, a cane, or a similar object. Place your hands a little more than shoulder-width apart on the object. Your left / right hand should be palm-up, and your other hand should be palm-down. 2. Push the stick to raise  your left / right arm out to your side and then over your head. Use your other hand to help move the stick. Stop when you feel a stretch in your shoulder, or when you reach the angle that is recommended by your health care provider.  Avoid shrugging your shoulder while you raise your arm. Keep your shoulder blade tucked down toward your spine. 3. Hold for __________ seconds. 4. Slowly return to the starting position. Repeat __________ times. Complete this exercise __________ times a day. Exercise E: Shoulder flexion, active-assisted 1. Lie on your back. You may bend your knees for comfort. 2. Hold a broomstick, a cane, or a similar object so your hands are about shoulder-width apart. Your palms should face toward your feet. 3. Raise your left / right arm over your head and behind your head, toward the floor. Use your other hand to help you do this. Stop when you feel a gentle stretch in your shoulder, or when you reach the angle that is recommended by your health care provider. 4. Hold for __________ seconds. 5. Use the broomstick and your other arm to help you return your left / right arm to the starting position. Repeat __________ times. Complete this exercise __________ times a day. Exercise F: External rotation 1. Sit in a stable chair without armrests, or stand. 2. Tuck a soft object, such as a folded towel or a small ball, under your left / right upper arm. 3. Hold a broomstick, a cane, or a similar object so your palms face down, toward the floor. Bend your elbows to an "L" shape (90 degrees), and keep your hands about shoulder-width apart. 4. Straighten your healthy arm and push the broomstick across your body, toward your left / right side. Keep your left / right arm bent. This will rotate your left / right forearm away from your body. 5. Hold for __________ seconds. 6. Slowly return to the starting position. Repeat __________ times. Complete this exercise __________ times a  day. Strengthening exercises These exercises build strength and endurance in your shoulder. Endurance is the ability to use your muscles for a long time, even after they get tired. Exercise G: Shoulder flexion, isometric 1. Stand or sit about 4-6 inches (10-15 cm) away from a wall with your left / right side facing the wall. 2. Gently make a fist and place your left / right hand on the wall so the top of your fist touches the wall. 3. With your left / right elbow straight, gently press the top of your fist into the wall. Gradually increase the pressure until you are pressing as hard as you can without shrugging your shoulder. 4. Hold for __________ seconds. 5. Slowly release the tension and relax your muscles completely before you repeat the exercise. Repeat __________ times. Complete this exercise __________ times a day. Exercise H: Shoulder abduction, isometric 1. Stand or sit about 4-6 inches (10-15 cm) away from a wall with your right/left side facing the wall. 2. [place] your  left / right elbow and gently press your elbow into the wall as if you are trying to move your arm out to your side. Increase the pressure gradually until you are pressing as hard as you can without shrugging your shoulder. 3. Hold for __________ seconds. 4. Slowly release the tension and relax your muscles completely before repeating the exercise. Repeat __________ times. Complete this exercise __________ times a day. Exercise I: Internal rotation, isometric 1. Stand or sit in a doorway, facing the door frame. 2. Bend your left / right elbow and place the palm of your hand against the door frame. Only your palm should be touching the frame. Keep your upper arm at your side. 3. Gently press your hand into the door frame, as if you are trying to push your arm toward your abdomen. Do not let your wrist bend.  Avoid shrugging your shoulder while you press your hand into the door frame. Keep your shoulder blade tucked down  toward the middle of your back. 4. Hold for __________ seconds. 5. Slowly release the tension, and relax your muscles completely before you repeat the exercise. Repeat __________ times. Complete this exercise __________ times a day. Exercise J: External rotation, isometric 1. Stand or sit in a doorway, facing the door frame. 2. Bend your left / right elbow and place the back of your wrist against the door frame. Only the back of your wrist should be touching the frame. Keep your upper arm at your side. 3. Gently press your wrist against the door frame, as if you are trying to push your arm away from your abdomen.  Avoid shrugging your shoulder while you press your wrist into the door frame. Keep your shoulder blade tucked down toward the middle of your back. 4. Hold for __________ seconds. 5. Slowly release the tension, and relax your muscles completely before you repeat the exercise. Repeat __________ times. Complete this exercise __________ times a day. This information is not intended to replace advice given to you by your health care provider. Make sure you discuss any questions you have with your health care provider. Document Released: 09/30/2005 Document Revised: 06/06/2016 Document Reviewed: 10/14/2015 Elsevier Interactive Patient Education  2017 [employer].

## 2016-09-11 NOTE — Progress Notes (Signed)
Remote pacemaker transmission.   

## 2016-09-12 ENCOUNTER — Encounter: Payer: Self-pay | Admitting: Cardiology

## 2016-09-15 ENCOUNTER — Other Ambulatory Visit: Payer: Self-pay | Admitting: Cardiovascular Disease

## 2016-09-16 ENCOUNTER — Ambulatory Visit (INDEPENDENT_AMBULATORY_CARE_PROVIDER_SITE_OTHER): Payer: PPO | Admitting: Pharmacist Clinician (PhC)/ Clinical Pharmacy Specialist

## 2016-09-16 DIAGNOSIS — I4821 Permanent atrial fibrillation: Secondary | ICD-10-CM

## 2016-09-16 DIAGNOSIS — Z7901 Long term (current) use of anticoagulants: Secondary | ICD-10-CM

## 2016-09-16 DIAGNOSIS — I482 Chronic atrial fibrillation, unspecified: Secondary | ICD-10-CM

## 2016-09-16 DIAGNOSIS — I4891 Unspecified atrial fibrillation: Secondary | ICD-10-CM | POA: Diagnosis not present

## 2016-09-16 LAB — POCT INR: INR: 2.3

## 2016-09-16 MED ORDER — WARFARIN SODIUM 2.5 MG PO TABS: ORAL_TABLET | ORAL | 1 refills | Status: DC

## 2016-09-17 ENCOUNTER — Other Ambulatory Visit: Payer: Self-pay | Admitting: Cardiovascular Disease

## 2016-09-18 DIAGNOSIS — N401 Enlarged prostate with lower urinary tract symptoms: Secondary | ICD-10-CM | POA: Diagnosis not present

## 2016-09-18 DIAGNOSIS — R350 Frequency of micturition: Secondary | ICD-10-CM | POA: Diagnosis not present

## 2016-09-25 ENCOUNTER — Other Ambulatory Visit (INDEPENDENT_AMBULATORY_CARE_PROVIDER_SITE_OTHER): Payer: PPO

## 2016-09-25 ENCOUNTER — Encounter: Payer: Self-pay | Admitting: Family

## 2016-09-25 ENCOUNTER — Ambulatory Visit (INDEPENDENT_AMBULATORY_CARE_PROVIDER_SITE_OTHER): Payer: PPO | Admitting: Family

## 2016-09-25 DIAGNOSIS — G479 Sleep disorder, unspecified: Secondary | ICD-10-CM | POA: Diagnosis not present

## 2016-09-25 DIAGNOSIS — R1901 Right upper quadrant abdominal swelling, mass and lump: Secondary | ICD-10-CM

## 2016-09-25 LAB — CBC WITH DIFFERENTIAL/PLATELET
Basophils Absolute: 0 10*3/uL (ref 0.0–0.1)
Basophils Relative: 0.3 % (ref 0.0–3.0)
EOS PCT: 5.4 % — AB (ref 0.0–5.0)
Eosinophils Absolute: 0.4 10*3/uL (ref 0.0–0.7)
HCT: 46.3 % (ref 39.0–52.0)
HEMOGLOBIN: 15.9 g/dL (ref 13.0–17.0)
Lymphocytes Relative: 30.9 % (ref 12.0–46.0)
Lymphs Abs: 2.4 10*3/uL (ref 0.7–4.0)
MCHC: 34.5 g/dL (ref 30.0–36.0)
MCV: 94.9 fl (ref 78.0–100.0)
MONOS PCT: 5.6 % (ref 3.0–12.0)
Monocytes Absolute: 0.4 10*3/uL (ref 0.1–1.0)
Neutro Abs: 4.5 10*3/uL (ref 1.4–7.7)
Neutrophils Relative %: 57.8 % (ref 43.0–77.0)
Platelets: 231 10*3/uL (ref 150.0–400.0)
RBC: 4.88 Mil/uL (ref 4.22–5.81)
RDW: 13.1 % (ref 11.5–15.5)
WBC: 7.8 10*3/uL (ref 4.0–10.5)

## 2016-09-25 LAB — LIPASE: LIPASE: 25 U/L (ref 11.0–59.0)

## 2016-09-25 LAB — HEPATIC FUNCTION PANEL
ALT: 8 U/L (ref 0–53)
AST: 12 U/L (ref 0–37)
Albumin: 3.7 g/dL (ref 3.5–5.2)
Alkaline Phosphatase: 113 U/L (ref 39–117)
BILIRUBIN TOTAL: 0.9 mg/dL (ref 0.2–1.2)
Bilirubin, Direct: 0.2 mg/dL (ref 0.0–0.3)
Total Protein: 7.1 g/dL (ref 6.0–8.3)

## 2016-09-25 MED ORDER — TRAZODONE HCL 50 MG PO TABS: 25.0000 mg | ORAL_TABLET | Freq: Every evening | ORAL | 0 refills | Status: DC | PRN

## 2016-09-25 NOTE — Progress Notes (Signed)
Subjective:    Patient ID: Benjamin Mckay, male    DOB: 11/13/1945, 70 y.o.   MRN: [id number]  Chief Complaint  Patient presents with  . Tightness    has a tightness feeling in the right side of abdomen, x2 months     HPI:  Benjamin Mckay is a 70 y.o. male who  has a past medical history of Anxiety; Atrial fibrillation ([place]); Cardiomyopathy, ischemic (11/07/2013); Cataract; CHB (complete heart block) ([place]) (11/06/2013); Chronic combined systolic and diastolic CHF (congestive heart failure) ([place]); Colon polyps; Colon polyps; Congenital heart block; Constipation; CVA (cerebral infarction); Depression; Diverticulosis; Esophageal stricture; Family history of adverse reaction to anesthesia; Hyperlipidemia; Hypertension; Hyperthyroidism (11/16/2015); Lymphoma ([place]) (1998); Presence of permanent cardiac pacemaker; Shortness of breath dyspnea; Stroke [facility]); Umbilical hernia; and Urinary frequency. and presents today for an acute office visit.   This is new problem. Associated symptoms of tightness located on the the right side of his abdomen has been going on for about 2 months. Described as constant tightness with no pain. There are times that it feels less. No abdominal pain but there is an occasional odd feeling. States that it feels numbs. Does have a history of a pleural drainage catheter located in a similar area. Reports that he is eating okay. No nausea, vomiting, diarrhea, or constipation. Bowel movements are every other day at present. After eating there is increased feelings of tightness. No reflux or burning sensation. No night time cough.  Denies any modifying factors that make it better or worse. Discomfort severity is enough to effect his sleep when combined with shoulder issues.   Allergies  Allergen Reactions  . Statins Other (See Comments)    Leg cramps.   Tolerates pravastatin.    . Latex Other (See Comments)    Skin irritation  . Adhesive [Tape] Other (See Comments)    Skin  irritation - please use paper tape  . Penicillins Rash    Has patient had a PCN reaction causing immediate rash, facial/tongue/throat swelling, SOB or lightheadedness with hypotension: Yes Has patient had a PCN reaction causing severe rash involving mucus membranes or skin necrosis: No Has patient had a PCN reaction that required hospitalization No Has patient had a PCN reaction occurring within the last 10 years: No If all of the above answers are "NO", then may proceed with Cephalosporin use.      Outpatient Medications Prior to Visit  Medication Sig Dispense Refill  . acetaminophen (TYLENOL) 500 MG tablet Take 1,000 mg by mouth every 6 (six) hours as needed for moderate pain.     Marland Kitchen docusate sodium (COLACE) 100 MG capsule Take 100 mg by mouth daily as needed for mild constipation.    . furosemide (LASIX) 20 MG tablet Take 1 tablet (20 mg total) by mouth daily. 30 tablet 5  . metoprolol succinate (TOPROL-XL) 25 MG 24 hr tablet Take 1 tablet (25 mg total) by mouth daily. 30 tablet 11  . pravastatin (PRAVACHOL) 80 MG tablet TAKE 1 TABLET BY MOUTH EVERY DAY (Patient taking differently: TAKE 1 TABLET BY MOUTH EVERY DAY AT 8PM.) 90 tablet 3  . Tamsulosin HCl (FLOMAX) 0.4 MG CAPS Take 0.4 mg by mouth at bedtime.     Marland Kitchen warfarin (COUMADIN) 2.5 MG tablet Take 1/2 -1 tablet by mouth daily as directed by coumadin clinic 90 tablet 1  . warfarin (COUMADIN) 2.5 MG tablet TAKE 1/2 -1 TABLET BY MOUTH DAILY AS DIRECTED BY COUMADIN CLINIC 90 tablet 1  No facility-administered medications prior to visit.       Past Surgical History:  Procedure Laterality Date  . APPENDECTOMY  1953  . CARDIAC CATHETERIZATION  01/06/2003   Recommend medical therapy  . CARDIOVASCULAR STRESS TEST  07/16/2012   Mild-moderate perfusion defect seen in Basal inferior, Mid inferior, and Apica lateral consistent with infarct/scar. No scintigraphic evidence for inducible myocardial ischemia. No ECG changes. EKG negative for  ischemia.  Marland Kitchen CAROTID DOPPLER  11/04/2012   Proximal Rt ICA 50-99% diameter reduction; Lft Bulb demonstrated mild amount homogeneous plaque-not hemodynamically significant; Lft ICA-normal patency.  . CHEST TUBE INSERTION Right 03/15/2016   Procedure: INSERTION PLEURAL DRAINAGE CATHETER;  Surgeon: Ivin Poot, MD;  Location: [place];  Service: Thoracic;  Laterality: Right;  . COLECTOMY     for lymphoma  . COLONOSCOPY    . neck fusion    . PACEMAKER GENERATOR CHANGE  01/31/2012   St Jude Med Accent DR RF model [id number] serial [id number]  . PACEMAKER PLACEMENT     replaced 3 x  . PERMANENT PACEMAKER GENERATOR CHANGE N/A 01/31/2012   Procedure: PERMANENT PACEMAKER GENERATOR CHANGE;  Surgeon: Sanda Klein, MD;  Location: [place] CATH LAB;   . PLEURADESIS N/A 04/02/2016   Procedure: PLEURADESIS;  Surgeon: Ivin Poot, MD;  Location: [place];  Service: Thoracic;  Laterality: N/A;  . REMOVAL OF PLEURAL DRAINAGE CATHETER Right 07/26/2016   Procedure: REMOVAL OF PLEURAL DRAINAGE CATHETER;  Surgeon: Ivin Poot, MD;  Location: [place];  Service: Thoracic;  Laterality: Right;  . TONSILLECTOMY    . TRANSTHORACIC ECHOCARDIOGRAM  11/04/2012   EF 123456, systolic function moderately reduced, mild regurg of the aortic and mitral valves.  Marland Kitchen VIDEO ASSISTED THORACOSCOPY Right 04/02/2016   Procedure: Right VIDEO ASSISTED THORACOSCOPY with Biopsies and drainage pleural effusion;  Surgeon: Ivin Poot, MD;  Location: [place];  Service: Thoracic;  Laterality: Right;      Past Medical History:  Diagnosis Date  . Anxiety   . Atrial fibrillation ([place])   . Cardiomyopathy, ischemic 11/07/2013  . Cataract   . CHB (complete heart block) ([place]) 11/06/2013  . Chronic combined systolic and diastolic CHF (congestive heart failure) ([place])   . Colon polyps   . Colon polyps   . Congenital heart block   . Constipation   . CVA (cerebral infarction)   . Depression   . Diverticulosis   . Esophageal stricture   . Family  history of adverse reaction to anesthesia   . Hyperlipidemia   . Hypertension   . Hyperthyroidism 11/16/2015  . Lymphoma ([place]) 1998   of colon   . Presence of permanent cardiac pacemaker    St. Jude  pt.states he is totally dependent on pacemaker  . Shortness of breath dyspnea    Pulmonary Effusion  . Stroke [facility])    [id number] TIA  . Umbilical hernia   . Urinary frequency       Review of Systems  Constitutional: Negative for chills and fever.  Respiratory: Negative for chest tightness, shortness of breath and wheezing.   Cardiovascular: Negative for chest pain, palpitations and leg swelling.  Gastrointestinal: Positive for abdominal distention. Negative for abdominal pain, blood in stool, constipation, diarrhea, nausea and vomiting.  Neurological: Negative for weakness and numbness.      Objective:    BP (!) 158/80 (BP Location: Left Arm, Patient Position: Sitting, Cuff Size: Normal)   Pulse 81   Temp 97.8 F (36.6 C) (Oral)   Resp 16  Ht 5\' 9"  (1.753 m)   Wt 191 lb (86.6 kg)   SpO2 98%   BMI 28.21 kg/m  Nursing note and vital signs reviewed.  Physical Exam  Constitutional: He is oriented to person, place, and time. He appears well-developed and well-nourished.  Non-toxic appearance. He does not have a sickly appearance. He does not appear ill. No distress.  Cardiovascular: Normal rate, regular rhythm, normal heart sounds and intact distal pulses.   Pulmonary/Chest: Effort normal and breath sounds normal.  Abdominal: Normal appearance and bowel sounds are normal. He exhibits no ascites and no mass. There is no hepatosplenomegaly. There is tenderness in the right upper quadrant. There is no rigidity, no guarding, no tenderness at McBurney's point and negative Murphy's sign. No hernia.  Neurological: He is alert and oriented to person, place, and time.  Skin: Skin is warm and dry.  Psychiatric: He has a normal mood and affect. His behavior is normal. Judgment and thought  content normal.       Assessment & Plan:   Problem List Items Addressed This Visit      Other   Right upper quadrant abdominal swelling    Right upper quadrant swelling/bloating of undetermined cause although differentials include gall bladder disease, liver disease, or possible relation to scar tissue from previous pleural effusion catheter. Obtain hepatic panel, CBC w/ diff, and lipase. Obtain CT scan. Pain is more of a discomfort. Consider trial of PPI as this may also be GERD related although unlikely with current symptoms.       Relevant Orders   CT Abdomen Pelvis Wo Contrast   Hepatic function panel   Lipase   CBC w/Diff   Sleep disturbance    This is a new problem most likely related to shoulder discomfort and abdominal issues. Practices good sleep hygiene. Start trazodone as needed. Consider additional pain management if indicated.           I am having Mr. Kaniecki start on traZODone. I am also having him maintain his tamsulosin, metoprolol succinate, pravastatin, acetaminophen, furosemide, docusate sodium, warfarin, and warfarin.   Meds ordered this encounter  Medications  . traZODone (DESYREL) 50 MG tablet    Sig: Take 0.5-1 tablets (25-50 mg total) by mouth at bedtime as needed for sleep.    Dispense:  30 tablet    Refill:  0    Order Specific Question:   Supervising Provider    Answer:   Pricilla Holm A [id number]     Follow-up: Return in about 1 month (around 10/26/2016), or if symptoms worsen or fail to improve.  Mauricio Po, FNP

## 2016-09-25 NOTE — Patient Instructions (Signed)
Thank you for choosing [facility].  SUMMARY AND INSTRUCTIONS:  Consider a trial of antacid.   They will call to schedule your CT scan.   Medication:  Continue to take your medications as prescribed.  Labs:  Please stop by the lab on the lower level of the building for your blood work. Your results will be released to [place] (or called to you) after review, usually within 72 hours after test completion. If any changes need to be made, you will be notified at that same time.  1.) The lab is open from 7:30am to 5:30 pm Monday-Friday 2.) No appointment is necessary 3.) Fasting (if needed) is 6-8 hours after food and drink; black coffee and water are okay   Imaging / Radiology:  Please stop by radiology on the basement level of the building for your x-rays. Your results will be released to [place] (or called to you) after review, usually within 72 hours after test completion. If any treatments or changes are necessary, you will be notified at that same time.  Follow up:  If your symptoms worsen or fail to improve, please contact our office for further instruction, or in case of emergency go directly to the emergency room at the closest medical facility.

## 2016-09-25 NOTE — Assessment & Plan Note (Signed)
Right upper quadrant swelling/bloating of undetermined cause although differentials include gall bladder disease, liver disease, or possible relation to scar tissue from previous pleural effusion catheter. Obtain hepatic panel, CBC w/ diff, and lipase. Obtain CT scan. Pain is more of a discomfort. Consider trial of PPI as this may also be GERD related although unlikely with current symptoms.

## 2016-09-25 NOTE — Assessment & Plan Note (Signed)
This is a new problem most likely related to shoulder discomfort and abdominal issues. Practices good sleep hygiene. Start trazodone as needed. Consider additional pain management if indicated.

## 2016-09-26 ENCOUNTER — Other Ambulatory Visit: Payer: Self-pay | Admitting: Family

## 2016-09-26 ENCOUNTER — Other Ambulatory Visit: Payer: PPO

## 2016-09-30 ENCOUNTER — Ambulatory Visit
Admission: RE | Admit: 2016-09-30 | Discharge: 2016-09-30 | Disposition: A | Discharge status: Home / Self Care | Payer: PPO | Source: Ambulatory Visit | Attending: Family | Admitting: Family

## 2016-09-30 DIAGNOSIS — R1901 Right upper quadrant abdominal swelling, mass and lump: Secondary | ICD-10-CM

## 2016-10-02 ENCOUNTER — Other Ambulatory Visit: Payer: Self-pay | Admitting: *Deleted

## 2016-10-02 ENCOUNTER — Other Ambulatory Visit: Payer: Self-pay

## 2016-10-02 ENCOUNTER — Encounter: Payer: Self-pay | Admitting: *Deleted

## 2016-10-02 DIAGNOSIS — J910 Malignant pleural effusion: Secondary | ICD-10-CM | POA: Insufficient documentation

## 2016-10-02 DIAGNOSIS — J852 Abscess of lung without pneumonia: Secondary | ICD-10-CM

## 2016-10-03 ENCOUNTER — Encounter: Payer: Self-pay | Admitting: Cardiothoracic Surgery

## 2016-10-03 ENCOUNTER — Ambulatory Visit (INDEPENDENT_AMBULATORY_CARE_PROVIDER_SITE_OTHER): Payer: PPO | Admitting: Cardiothoracic Surgery

## 2016-10-03 ENCOUNTER — Ambulatory Visit
Admission: RE | Admit: 2016-10-03 | Discharge: 2016-10-03 | Disposition: A | Discharge status: Home / Self Care | Payer: PPO | Source: Ambulatory Visit | Attending: Family | Admitting: Family

## 2016-10-03 VITALS — BP 126/72 | HR 81 | Temp 96.8°F | Resp 16 | Ht 69.0 in | Wt 191.0 lb

## 2016-10-03 DIAGNOSIS — Z09 Encounter for follow-up examination after completed treatment for conditions other than malignant neoplasm: Secondary | ICD-10-CM

## 2016-10-03 DIAGNOSIS — J90 Pleural effusion, not elsewhere classified: Secondary | ICD-10-CM | POA: Diagnosis not present

## 2016-10-03 DIAGNOSIS — I509 Heart failure, unspecified: Secondary | ICD-10-CM | POA: Diagnosis not present

## 2016-10-03 DIAGNOSIS — J852 Abscess of lung without pneumonia: Secondary | ICD-10-CM

## 2016-10-03 NOTE — Progress Notes (Signed)
PCP is Mauricio Po, FNP Referring Provider is Juanito Doom, MD  Chief Complaint  Patient presents with  . Follow-up    complaining of cough...CT CHEST after findings on recent CT ABD.    HPI: Patient presents with CT scan of chest complaining of a heaviness or swollen sensation under his right costal margin. The patient had a right VATS with chemical pleurodesis and pleural biopsies and lung biopsies June 2017. His symptoms correspond with postthoracotomy pain syndrome. He has had some minimal coughing. He has had no fever. He has been gaining weight. He has been increasing his Lasix dosing for his chronic heart failure because of weight gain.  CT scan of the chest shows minimal right pleural effusion. There is pleural thickening involving both the visceral pleura and parietal pleura from the chemical pleurodesis which was performed June 2017 with resulting severe inflammation. This area was extensively biopsied during the VATS procedure and there is no sign of malignancy.  Past Medical History:  Diagnosis Date  . Anxiety   . Atrial fibrillation ([place])   . Cardiomyopathy, ischemic 11/07/2013  . Cataract   . CHB (complete heart block) ([place]) 11/06/2013  . Chronic combined systolic and diastolic CHF (congestive heart failure) ([place])   . Colon polyps   . Colon polyps   . Congenital heart block   . Constipation   . CVA (cerebral infarction)   . Depression   . Diverticulosis   . Esophageal stricture   . Family history of adverse reaction to anesthesia   . Hyperlipidemia   . Hypertension   . Hyperthyroidism 11/16/2015  . Lymphoma ([place]) 1998   of colon   . Presence of permanent cardiac pacemaker    St. Jude  pt.states he is totally dependent on pacemaker  . Shortness of breath dyspnea    Pulmonary Effusion  . Stroke [facility])    [id number] TIA  . Umbilical hernia   . Urinary frequency     Past Surgical History:  Procedure Laterality Date  . APPENDECTOMY  1953  . CARDIAC  CATHETERIZATION  01/06/2003   Recommend medical therapy  . CARDIOVASCULAR STRESS TEST  07/16/2012   Mild-moderate perfusion defect seen in Basal inferior, Mid inferior, and Apica lateral consistent with infarct/scar. No scintigraphic evidence for inducible myocardial ischemia. No ECG changes. EKG negative for ischemia.  Marland Kitchen CAROTID DOPPLER  11/04/2012   Proximal Rt ICA 50-99% diameter reduction; Lft Bulb demonstrated mild amount homogeneous plaque-not hemodynamically significant; Lft ICA-normal patency.  . CHEST TUBE INSERTION Right 03/15/2016   Procedure: INSERTION PLEURAL DRAINAGE CATHETER;  Surgeon: Ivin Poot, MD;  Location: [place];  Service: Thoracic;  Laterality: Right;  . COLECTOMY     for lymphoma  . COLONOSCOPY    . neck fusion    . PACEMAKER GENERATOR CHANGE  01/31/2012   St Jude Med Accent DR RF model [id number] serial [id number]  . PACEMAKER PLACEMENT     replaced 3 x  . PERMANENT PACEMAKER GENERATOR CHANGE N/A 01/31/2012   Procedure: PERMANENT PACEMAKER GENERATOR CHANGE;  Surgeon: Sanda Klein, MD;  Location: [place] CATH LAB;   . PLEURADESIS N/A 04/02/2016   Procedure: PLEURADESIS;  Surgeon: Ivin Poot, MD;  Location: [place];  Service: Thoracic;  Laterality: N/A;  . REMOVAL OF PLEURAL DRAINAGE CATHETER Right 07/26/2016   Procedure: REMOVAL OF PLEURAL DRAINAGE CATHETER;  Surgeon: Ivin Poot, MD;  Location: [place];  Service: Thoracic;  Laterality: Right;  . TONSILLECTOMY    . TRANSTHORACIC ECHOCARDIOGRAM  11/04/2012  EF 123456, systolic function moderately reduced, mild regurg of the aortic and mitral valves.  Marland Kitchen VIDEO ASSISTED THORACOSCOPY Right 04/02/2016   Procedure: Right VIDEO ASSISTED THORACOSCOPY with Biopsies and drainage pleural effusion;  Surgeon: Ivin Poot, MD;  Location: [facility];  Service: Thoracic;  Laterality: Right;    Family History  Problem Relation Age of Onset  . Prostate cancer Father   . Heart disease Mother   . Stroke Mother   . Diabetes Maternal  Grandmother   . Colon cancer Neg Hx   . Heart attack Neg Hx     Social History Social History  Substance Use Topics  . Smoking status: Never Smoker  . Smokeless tobacco: Never Used  . Alcohol use No    Current Outpatient Prescriptions  Medication Sig Dispense Refill  . acetaminophen (TYLENOL) 500 MG tablet Take 1,000 mg by mouth every 6 (six) hours as needed for moderate pain.     Marland Kitchen docusate sodium (COLACE) 100 MG capsule Take 100 mg by mouth daily as needed for mild constipation.    . furosemide (LASIX) 20 MG tablet Take 1 tablet (20 mg total) by mouth daily. 30 tablet 5  . metoprolol succinate (TOPROL-XL) 25 MG 24 hr tablet Take 1 tablet (25 mg total) by mouth daily. 30 tablet 11  . pravastatin (PRAVACHOL) 80 MG tablet TAKE 1 TABLET BY MOUTH EVERY DAY (Patient taking differently: TAKE 1 TABLET BY MOUTH EVERY DAY AT 8PM.) 90 tablet 3  . Tamsulosin HCl (FLOMAX) 0.4 MG CAPS Take 0.4 mg by mouth at bedtime.     . traZODone (DESYREL) 50 MG tablet Take 0.5-1 tablets (25-50 mg total) by mouth at bedtime as needed for sleep. 30 tablet 0  . warfarin (COUMADIN) 2.5 MG tablet Take 1/2 -1 tablet by mouth daily as directed by coumadin clinic 90 tablet 1   No current facility-administered medications for this visit.     Allergies  Allergen Reactions  . Statins Other (See Comments)    Leg cramps.   Tolerates pravastatin.    . Latex Other (See Comments)    Skin irritation  . Adhesive [Tape] Other (See Comments)    Skin irritation - please use paper tape  . Penicillins Rash    Has patient had a PCN reaction causing immediate rash, facial/tongue/throat swelling, SOB or lightheadedness with hypotension: Yes Has patient had a PCN reaction causing severe rash involving mucus membranes or skin necrosis: No Has patient had a PCN reaction that required hospitalization No Has patient had a PCN reaction occurring within the last 10 years: No If all of the above answers are "NO", then may proceed with  Cephalosporin use.    Review of Systems  Weight gain, no fever Minimal cough or shortness of breath No palpitations orthopnea or angina No change in vision or headache No difficulty swallowing or neck pain Right subcostal heaviness and sensation of swelling consistent with postthoracotomy pain Mild ankle edema No abdominal pain jaundice or change in bowel habits  BP 126/72 (BP Location: Left Arm, Patient Position: Sitting, Cuff Size: Large)   Pulse 81   Temp (!) 96.8 F (36 C) (Oral)   Resp 16   Ht 5\' 9"  (1.753 m)   Wt 191 lb (86.6 kg)   SpO2 98% Comment: ON RA  BMI 28.21 kg/m  Physical Exam       Exam    General- alert and comfortable   Lungs- clear without rales or wheezes on the left, diminished breath sounds  at right base. Well-healed right VATS-minithoracotomy incision   Cor- atrial fibrillation, no murmur , gallop   Abdomen- soft, non-tender   Extremities - warm, non-tender, minimal edema   Neuro- oriented, appropriate, no focal weakness    Diagnostic Tests: CT scan images personally reviewed and counseled with patient. The findings on CT scan correlate with the findings on his most recent chest x-ray in October of this year which show postoperative changes from the chemical pleurodesis and pleural biopsies for recurrent pleural effusion. There is extreme pleural thickening and minimal fluid.  Impression: Postthoracotomy symptoms and post thoracotomy chemical pleurodesis x-ray findings on CT scan. Patient does not need more surgery op sees or antibiotics. He is reassured that symptoms should improve with time and that it can take 9 months for postthoracotomy pain to improve.  Plan: Return for follow-up with chest x-ray in 2 months.   Len Childs, MD [facility] [phone]

## 2016-10-08 ENCOUNTER — Ambulatory Visit (INDEPENDENT_AMBULATORY_CARE_PROVIDER_SITE_OTHER): Payer: PPO | Admitting: Family

## 2016-10-08 ENCOUNTER — Encounter: Payer: Self-pay | Admitting: Family

## 2016-10-08 DIAGNOSIS — M7581 Other shoulder lesions, right shoulder: Secondary | ICD-10-CM

## 2016-10-08 NOTE — Assessment & Plan Note (Signed)
Continues to experience rotator cuff tendinitis and right shoulder pain that is refractory to conservative treatment. Cortisone injection provided today. Encouraged physical therapy, however patient declined at this time. Continue ice regimen, home exercise therapy, and Tylenol as needed. If symptoms worsen or do not improve refer to orthopedics for further evaluation and treatment.

## 2016-10-08 NOTE — Patient Instructions (Signed)
Thank you for choosing [facility].  SUMMARY AND INSTRUCTIONS:  Happy New Year!  No heavy lifting for the next 24 hours.   Continue with ice therapy.  Anti-inflammatories as needed.  Continue home exercise therapy.  If no improvements in 2-3 weeks we will send you to orthopedics.   Monitor for signs of infection.   Follow up:  If your symptoms worsen or fail to improve, please contact our office for further instruction, or in case of emergency go directly to the emergency room at the closest medical facility.

## 2016-10-08 NOTE — Progress Notes (Signed)
Subjective:    Patient ID: Benjamin Mckay, male    DOB: 01/01/1946, 70 y.o.   MRN: [id number]  Chief Complaint  Patient presents with  . Follow-up    cortisone injection on shoulder, CT scan results    HPI:  Benjamin Mckay is a 70 y.o. male who  has a past medical history of Anxiety; Atrial fibrillation ([place]); Cardiomyopathy, ischemic (11/07/2013); Cataract; CHB (complete heart block) ([place]) (11/06/2013); Chronic combined systolic and diastolic CHF (congestive heart failure) ([place]); Colon polyps; Colon polyps; Congenital heart block; Constipation; CVA (cerebral infarction); Depression; Diverticulosis; Esophageal stricture; Family history of adverse reaction to anesthesia; Hyperlipidemia; Hypertension; Hyperthyroidism (11/16/2015); Lymphoma ([place]) (1998); Presence of permanent cardiac pacemaker; Shortness of breath dyspnea; Stroke [facility]); Umbilical hernia; and Urinary frequency. and presents today for a follow up office visit.  1.) Rotator cuff tendinitis - previously evaluated in the office and diagnosed with rotator cuff tendinitis and treated conservatively with ice, home exercise therapy, and over-the-counter medications. X-rays were negative for any structural abnormalities or calcific tendinitis. Continues to experience the associated symptoms of right shoulder pain that has been refractory to conservative treatment. Endorses a dull ache especially at night with the severity enough to disturb his sleep. Mild neck discomfort without numbness or tingling.    Allergies  Allergen Reactions  . Statins Other (See Comments)    Leg cramps.   Tolerates pravastatin.    . Latex Other (See Comments)    Skin irritation  . Adhesive [Tape] Other (See Comments)    Skin irritation - please use paper tape  . Penicillins Rash    Has patient had a PCN reaction causing immediate rash, facial/tongue/throat swelling, SOB or lightheadedness with hypotension: Yes Has patient had a PCN reaction causing severe rash  involving mucus membranes or skin necrosis: No Has patient had a PCN reaction that required hospitalization No Has patient had a PCN reaction occurring within the last 10 years: No If all of the above answers are "NO", then may proceed with Cephalosporin use.      Outpatient Medications Prior to Visit  Medication Sig Dispense Refill  . acetaminophen (TYLENOL) 500 MG tablet Take 1,000 mg by mouth every 6 (six) hours as needed for moderate pain.     Marland Kitchen docusate sodium (COLACE) 100 MG capsule Take 100 mg by mouth daily as needed for mild constipation.    . furosemide (LASIX) 20 MG tablet Take 1 tablet (20 mg total) by mouth daily. 30 tablet 5  . metoprolol succinate (TOPROL-XL) 25 MG 24 hr tablet Take 1 tablet (25 mg total) by mouth daily. 30 tablet 11  . pravastatin (PRAVACHOL) 80 MG tablet TAKE 1 TABLET BY MOUTH EVERY DAY (Patient taking differently: TAKE 1 TABLET BY MOUTH EVERY DAY AT 8PM.) 90 tablet 3  . Tamsulosin HCl (FLOMAX) 0.4 MG CAPS Take 0.4 mg by mouth at bedtime.     . traZODone (DESYREL) 50 MG tablet Take 0.5-1 tablets (25-50 mg total) by mouth at bedtime as needed for sleep. 30 tablet 0  . warfarin (COUMADIN) 2.5 MG tablet Take 1/2 -1 tablet by mouth daily as directed by coumadin clinic 90 tablet 1   No facility-administered medications prior to visit.       Review of Systems  Constitutional: Negative for chills and fever.  Musculoskeletal:       Positive for right shoulder pain  Neurological: Positive for weakness. Negative for numbness.      Objective:    BP 122/80 (BP Location:  Left Arm, Patient Position: Sitting, Cuff Size: Large)   Pulse 66   Temp 97.6 F (36.4 C) (Oral)   Resp 16   Ht 5\' 9"  (1.753 m)   Wt 197 lb (89.4 kg)   SpO2 97%   BMI 29.09 kg/m  Nursing note and vital signs reviewed.  Physical Exam  Constitutional: He is oriented to person, place, and time. He appears well-developed and well-nourished. No distress.  Cardiovascular: Normal rate,  regular rhythm, normal heart sounds and intact distal pulses.   Pulmonary/Chest: Effort normal and breath sounds normal.  Musculoskeletal:  Right shoulder - no obvious deformity, discoloration, or edema. Palpable tenderness over subacromial space and deltoid. Range of motion slightly restricted greater than 120 of flexion and abduction. There is mild weakness noted in external rotation on the right side. Distal pulses and sensation are intact and appropriate. Positive Hawkins-Kennedy; negative Neer's; positive empty can  Neurological: He is alert and oriented to person, place, and time.  Skin: Skin is warm and dry.  Psychiatric: He has a normal mood and affect. His behavior is normal. Judgment and thought content normal.   Procedure: Corticosteroid injection of the right shoulder  Description: Informed consent was obtained with discussion including risks and benefits of the procedure. Patient verbally wished to continued. Time out was performed. The right shoulder was marked and identified for the posterior approach. It was cleansed with betadine using a concentric circular pattern. Skin ml  anesthesia was applied using cold spray applied for 10 seconds. Injection of 1 ml :4 of Kenalog (40 mg/ml)  to Sensoricaine was injected following aspiration with no return noted. Following the injection there was relief confirming appropriate placement. A bandage was applied to the area and post care instructions were provided. The procedure was tolerated well with no complications.     Assessment & Plan:   Problem List Items Addressed This Visit      Musculoskeletal and Integument   Rotator cuff tendinitis    Continues to experience rotator cuff tendinitis and right shoulder pain that is refractory to conservative treatment. Cortisone injection provided today. Encouraged physical therapy, however patient declined at this time. Continue ice regimen, home exercise therapy, and Tylenol as needed. If symptoms  worsen or do not improve refer to orthopedics for further evaluation and treatment.      Relevant Orders   AMB referral to orthopedics      I am having Mr. Nix maintain his tamsulosin, metoprolol succinate, pravastatin, acetaminophen, furosemide, docusate sodium, warfarin, and traZODone.   Follow-up: Return in about 3 weeks (around 10/29/2016).  Mauricio Po, FNP

## 2016-10-09 ENCOUNTER — Other Ambulatory Visit: Payer: PPO

## 2016-10-09 LAB — CUP PACEART REMOTE DEVICE CHECK
Battery Voltage: 2.98 V
Date Time Interrogation Session: 20171128070025
Implantable Lead Implant Date: 20130419
Implantable Lead Location: 753860
Implantable Pulse Generator Implant Date: 20130419
Lead Channel Impedance Value: 440 Ohm
Lead Channel Pacing Threshold Amplitude: 0.75 V
Lead Channel Pacing Threshold Pulse Width: 0.5 ms
Lead Channel Setting Pacing Pulse Width: 0.5 ms
MDC IDC MSMT BATTERY REMAINING LONGEVITY: 122 mo
MDC IDC MSMT BATTERY REMAINING PERCENTAGE: 95.5 %
MDC IDC MSMT LEADCHNL RV SENSING INTR AMPL: 11.8 mV
MDC IDC SET LEADCHNL RV PACING AMPLITUDE: 2.5 V
MDC IDC SET LEADCHNL RV SENSING SENSITIVITY: 2.5 mV
MDC IDC STAT BRADY RV PERCENT PACED: 73 %
Pulse Gen Model: 1210
Pulse Gen Serial Number: 7313697

## 2016-10-16 ENCOUNTER — Ambulatory Visit (INDEPENDENT_AMBULATORY_CARE_PROVIDER_SITE_OTHER): Payer: PPO | Admitting: Pharmacist Clinician (PhC)/ Clinical Pharmacy Specialist

## 2016-10-16 ENCOUNTER — Ambulatory Visit: Payer: PPO | Admitting: Cardiology

## 2016-10-16 DIAGNOSIS — Z7901 Long term (current) use of anticoagulants: Secondary | ICD-10-CM | POA: Diagnosis not present

## 2016-10-16 DIAGNOSIS — I482 Chronic atrial fibrillation, unspecified: Secondary | ICD-10-CM

## 2016-10-16 DIAGNOSIS — I4891 Unspecified atrial fibrillation: Secondary | ICD-10-CM | POA: Diagnosis not present

## 2016-10-16 DIAGNOSIS — I4821 Permanent atrial fibrillation: Secondary | ICD-10-CM

## 2016-10-16 LAB — POCT INR: INR: 1.7

## 2016-10-22 ENCOUNTER — Other Ambulatory Visit: Payer: Self-pay | Admitting: Family

## 2016-10-23 DIAGNOSIS — H1849 Other corneal degeneration: Secondary | ICD-10-CM | POA: Diagnosis not present

## 2016-10-23 DIAGNOSIS — Z961 Presence of intraocular lens: Secondary | ICD-10-CM | POA: Diagnosis not present

## 2016-10-28 DIAGNOSIS — E0580 Other thyrotoxicosis without thyrotoxic crisis or storm: Secondary | ICD-10-CM | POA: Diagnosis not present

## 2016-11-01 ENCOUNTER — Ambulatory Visit (INDEPENDENT_AMBULATORY_CARE_PROVIDER_SITE_OTHER): Payer: PPO | Admitting: Pharmacist Clinician (PhC)/ Clinical Pharmacy Specialist

## 2016-11-01 DIAGNOSIS — I482 Chronic atrial fibrillation, unspecified: Secondary | ICD-10-CM

## 2016-11-01 DIAGNOSIS — Z7901 Long term (current) use of anticoagulants: Secondary | ICD-10-CM

## 2016-11-01 DIAGNOSIS — I4891 Unspecified atrial fibrillation: Secondary | ICD-10-CM | POA: Diagnosis not present

## 2016-11-01 DIAGNOSIS — I5023 Acute on chronic systolic (congestive) heart failure: Secondary | ICD-10-CM | POA: Diagnosis not present

## 2016-11-01 DIAGNOSIS — I4821 Permanent atrial fibrillation: Secondary | ICD-10-CM

## 2016-11-01 LAB — POCT INR: INR: 1.4

## 2016-11-01 MED ORDER — METOPROLOL SUCCINATE ER 25 MG PO TB24: 25.0000 mg | ORAL_TABLET | Freq: Every day | ORAL | 1 refills | Status: DC

## 2016-11-01 MED ORDER — FUROSEMIDE 20 MG PO TABS: ORAL_TABLET | ORAL | 1 refills | Status: DC

## 2016-11-11 ENCOUNTER — Other Ambulatory Visit: Payer: Self-pay | Admitting: Cardiology

## 2016-11-15 ENCOUNTER — Ambulatory Visit (INDEPENDENT_AMBULATORY_CARE_PROVIDER_SITE_OTHER): Payer: PPO | Admitting: Pharmacist

## 2016-11-15 DIAGNOSIS — Z7901 Long term (current) use of anticoagulants: Secondary | ICD-10-CM

## 2016-11-15 DIAGNOSIS — I4821 Permanent atrial fibrillation: Secondary | ICD-10-CM

## 2016-11-15 DIAGNOSIS — I482 Chronic atrial fibrillation, unspecified: Secondary | ICD-10-CM

## 2016-11-15 DIAGNOSIS — I4891 Unspecified atrial fibrillation: Secondary | ICD-10-CM

## 2016-11-15 LAB — POCT INR: INR: 2

## 2016-11-15 IMAGING — CR DG CHEST 1V PORT
1 series · 1 of 1 positions shown · non-contrast
Comparison: 04/04/2016

CLINICAL DATA: Pneumothorax

EXAM:
PORTABLE CHEST 1 VIEW

[ap]
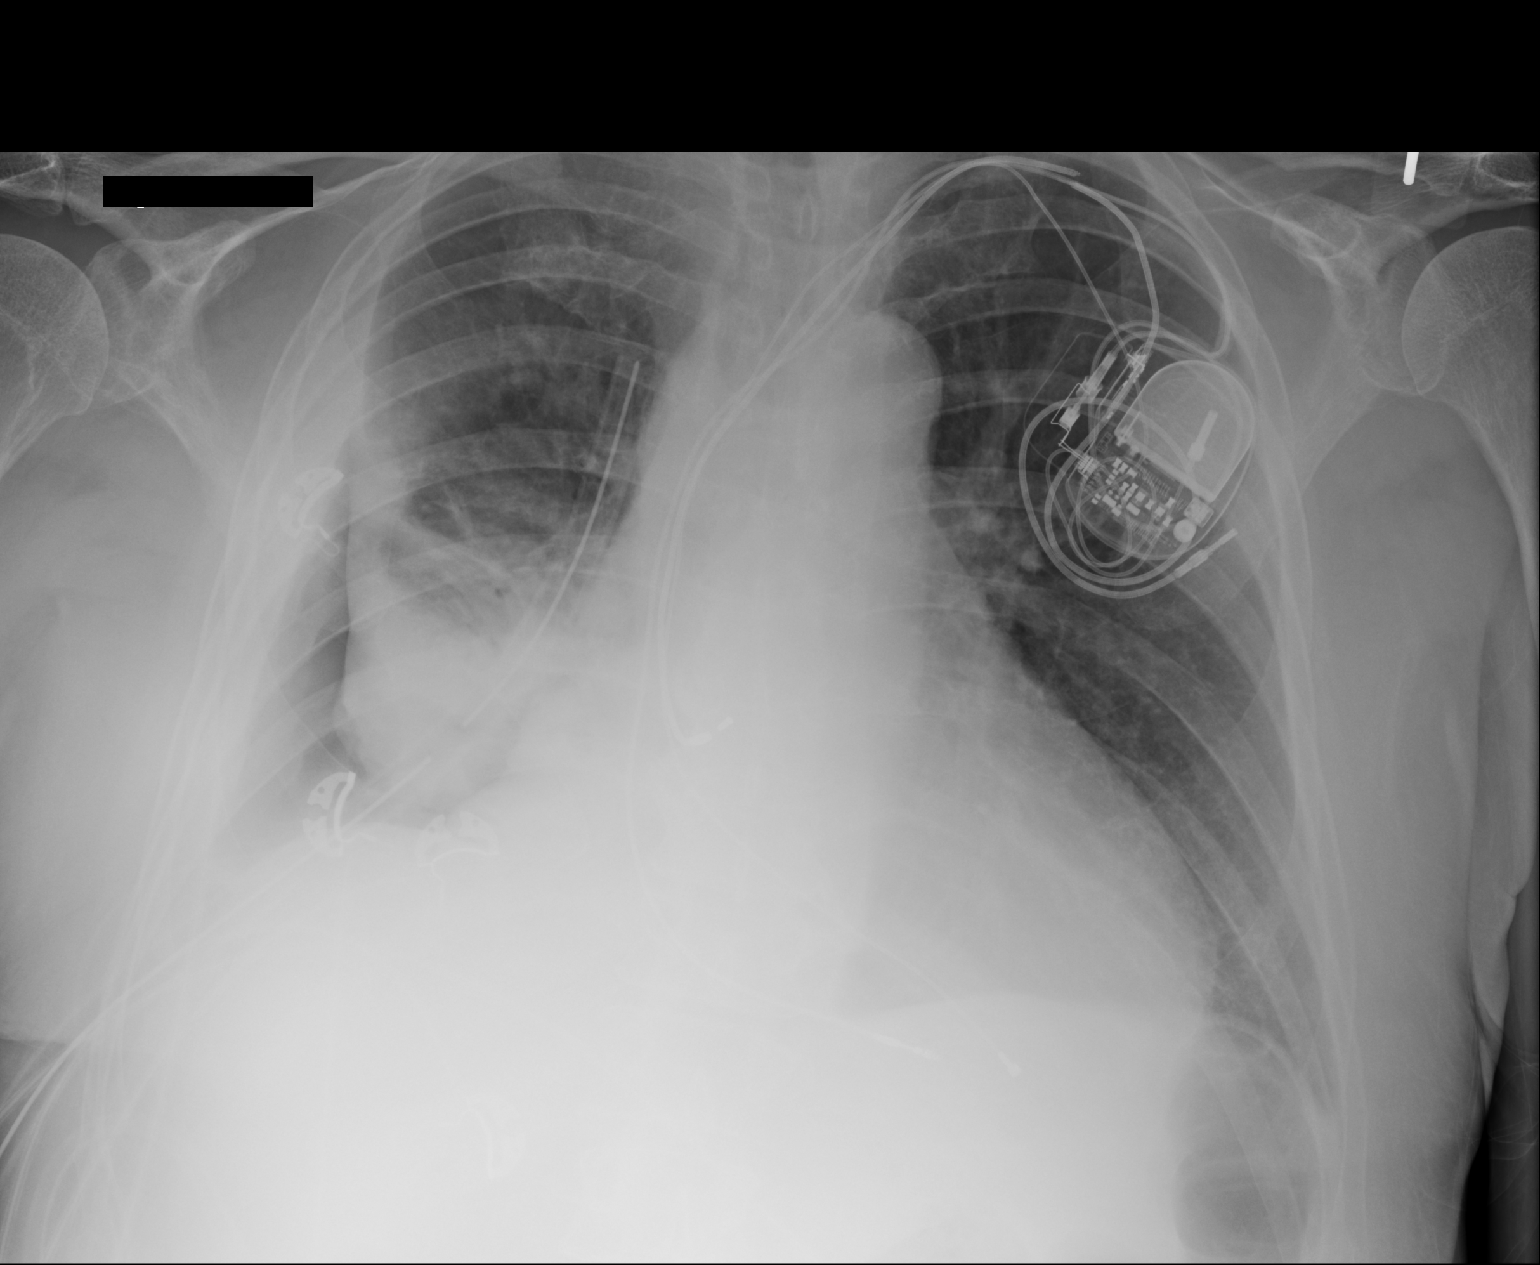

[1 of 1 positions shown; findings below may reference images not displayed]

FINDINGS: 6463 hours. Right chest tube remains in place with persistent stable
right pneumothorax. Collapse/ consolidation in the lower right lung
is stable. Left lung remains clear. The cardio pericardial
silhouette is enlarged. Left-sided pacer remains in place.
IMPRESSION: Stable exam.

## 2016-11-16 IMAGING — CR DG CHEST 2V
2 series · 2 of 2 positions shown · non-contrast
Comparison: 04/05/2016

CLINICAL DATA: Followup right chest tube

EXAM:
CHEST  2 VIEW

[chest lat]
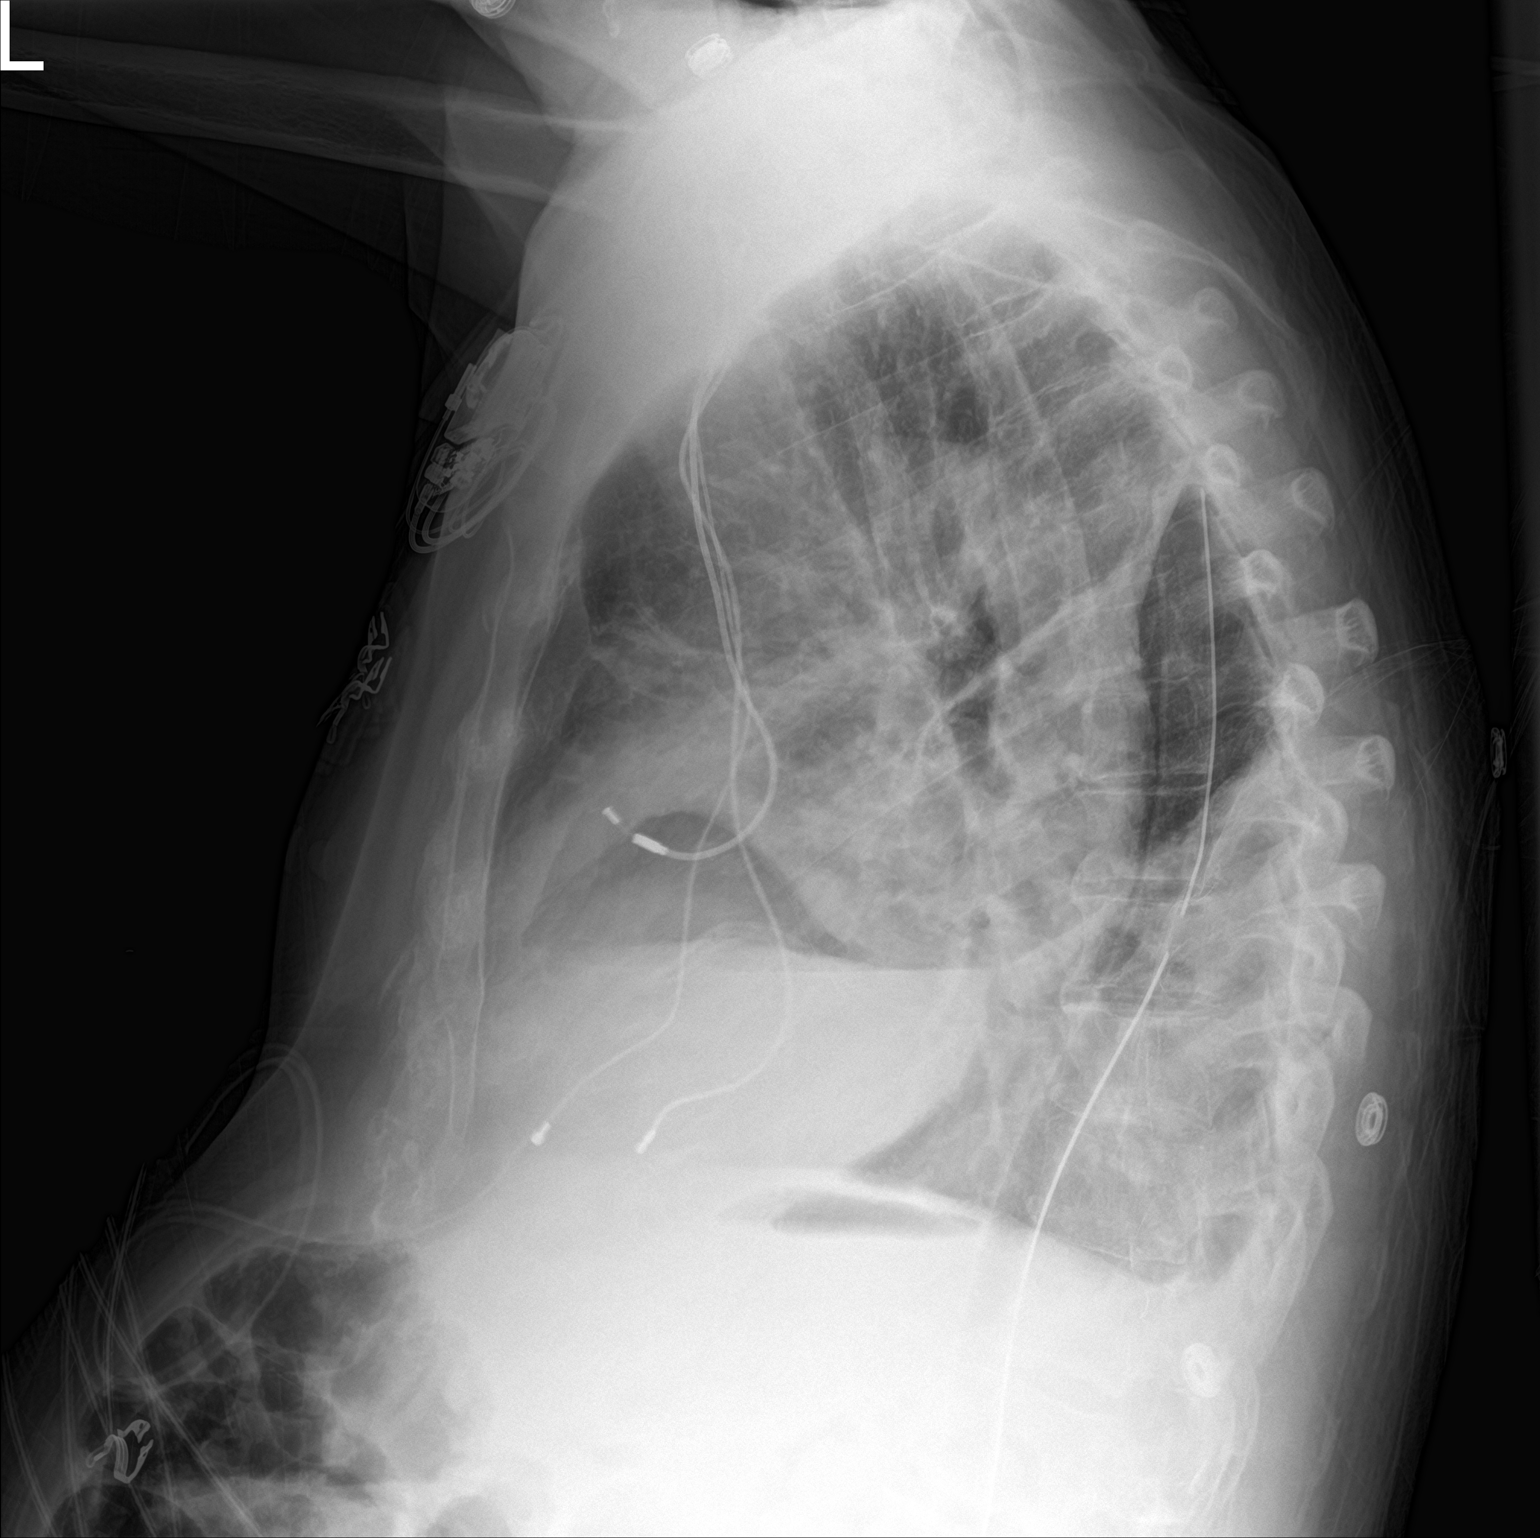

[chest ap]
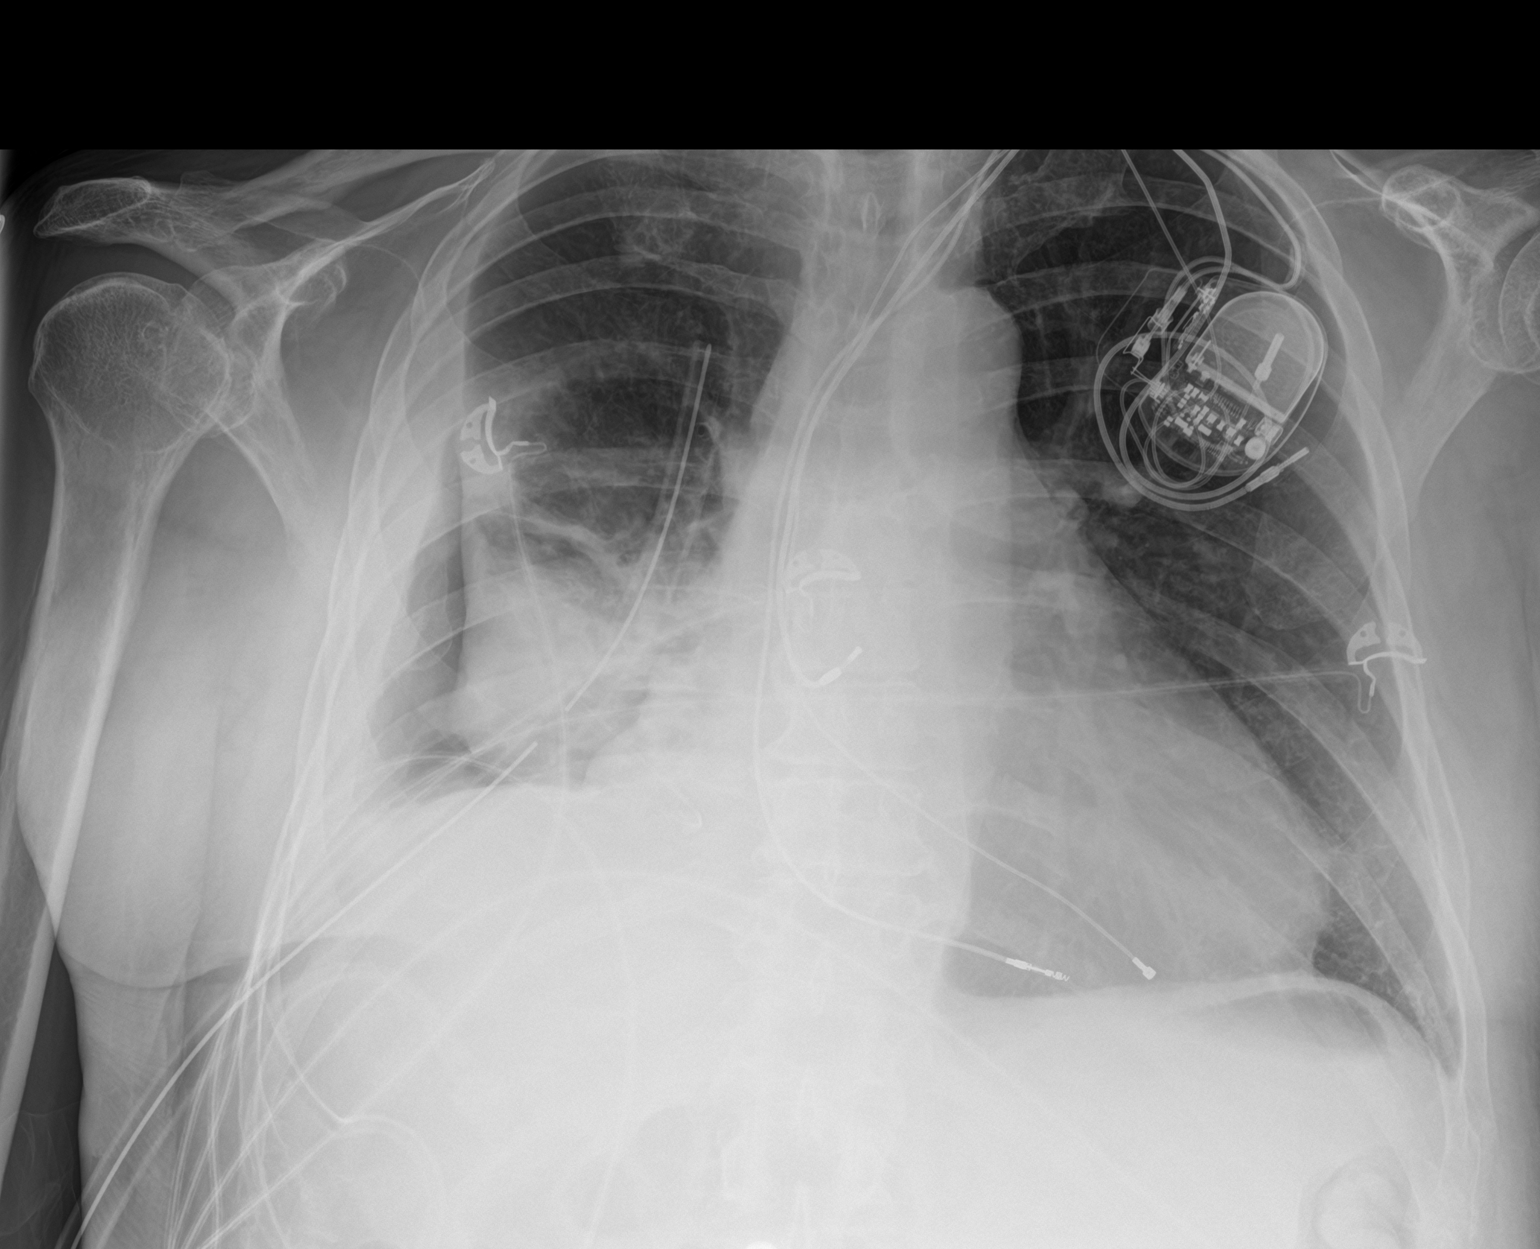

[2 of 2 positions shown; findings below may reference images not displayed]

FINDINGS: Cardiomediastinal silhouette is stable. 3 leads cardiac pacemaker is
unchanged in position. Stable right chest tube position. Right lower
lateral small pneumothorax is stable. Stable small loculated right
pleural effusion. Again noted atelectasis or infiltrate in right
lung lower lobe. Left lung is clear. No pulmonary edema.
IMPRESSION: Stable 3 leads cardiac pacemaker. Right chest tube is unchanged in
position. Stable right pneumothorax and small right pleural
effusion. Atelectasis or infiltrate in right lower lobe again noted.

## 2016-11-17 ENCOUNTER — Other Ambulatory Visit: Payer: Self-pay | Admitting: Family

## 2016-12-11 ENCOUNTER — Ambulatory Visit (INDEPENDENT_AMBULATORY_CARE_PROVIDER_SITE_OTHER): Payer: PPO | Admitting: Cardiovascular Disease

## 2016-12-11 ENCOUNTER — Encounter: Payer: Self-pay | Admitting: Cardiovascular Disease

## 2016-12-11 ENCOUNTER — Ambulatory Visit (INDEPENDENT_AMBULATORY_CARE_PROVIDER_SITE_OTHER): Payer: PPO | Admitting: Pharmacist

## 2016-12-11 VITALS — BP 116/78 | HR 79 | Ht 69.0 in | Wt 198.0 lb

## 2016-12-11 DIAGNOSIS — I5022 Chronic systolic (congestive) heart failure: Secondary | ICD-10-CM | POA: Diagnosis not present

## 2016-12-11 DIAGNOSIS — R05 Cough: Secondary | ICD-10-CM

## 2016-12-11 DIAGNOSIS — I482 Chronic atrial fibrillation, unspecified: Secondary | ICD-10-CM

## 2016-12-11 DIAGNOSIS — Z7901 Long term (current) use of anticoagulants: Secondary | ICD-10-CM | POA: Diagnosis not present

## 2016-12-11 DIAGNOSIS — Z79899 Other long term (current) drug therapy: Secondary | ICD-10-CM | POA: Diagnosis not present

## 2016-12-11 DIAGNOSIS — E0590 Thyrotoxicosis, unspecified without thyrotoxic crisis or storm: Secondary | ICD-10-CM

## 2016-12-11 DIAGNOSIS — I472 Ventricular tachycardia, unspecified: Secondary | ICD-10-CM

## 2016-12-11 DIAGNOSIS — T462X5A Adverse effect of other antidysrhythmic drugs, initial encounter: Secondary | ICD-10-CM

## 2016-12-11 DIAGNOSIS — I10 Essential (primary) hypertension: Secondary | ICD-10-CM

## 2016-12-11 DIAGNOSIS — I4891 Unspecified atrial fibrillation: Secondary | ICD-10-CM | POA: Diagnosis not present

## 2016-12-11 DIAGNOSIS — I701 Atherosclerosis of renal artery: Secondary | ICD-10-CM

## 2016-12-11 DIAGNOSIS — Z950 Presence of cardiac pacemaker: Secondary | ICD-10-CM | POA: Diagnosis not present

## 2016-12-11 DIAGNOSIS — E785 Hyperlipidemia, unspecified: Secondary | ICD-10-CM

## 2016-12-11 DIAGNOSIS — I6523 Occlusion and stenosis of bilateral carotid arteries: Secondary | ICD-10-CM

## 2016-12-11 DIAGNOSIS — I442 Atrioventricular block, complete: Secondary | ICD-10-CM

## 2016-12-11 DIAGNOSIS — I2510 Atherosclerotic heart disease of native coronary artery without angina pectoris: Secondary | ICD-10-CM

## 2016-12-11 DIAGNOSIS — R059 Cough, unspecified: Secondary | ICD-10-CM

## 2016-12-11 DIAGNOSIS — R42 Dizziness and giddiness: Secondary | ICD-10-CM

## 2016-12-11 DIAGNOSIS — E0580 Other thyrotoxicosis without thyrotoxic crisis or storm: Secondary | ICD-10-CM

## 2016-12-11 DIAGNOSIS — I4821 Permanent atrial fibrillation: Secondary | ICD-10-CM

## 2016-12-11 DIAGNOSIS — J90 Pleural effusion, not elsewhere classified: Secondary | ICD-10-CM

## 2016-12-11 DIAGNOSIS — R8299 Other abnormal findings in urine: Secondary | ICD-10-CM

## 2016-12-11 DIAGNOSIS — R82998 Other abnormal findings in urine: Secondary | ICD-10-CM

## 2016-12-11 LAB — CUP PACEART INCLINIC DEVICE CHECK
Date Time Interrogation Session: 20180228095737
Implantable Lead Location: 753860
Implantable Pulse Generator Implant Date: 20130419
Lead Channel Impedance Value: 450 Ohm
Lead Channel Pacing Threshold Pulse Width: 0.5 ms
Lead Channel Setting Sensing Sensitivity: 2.5 mV
MDC IDC LEAD IMPLANT DT: 20130419
MDC IDC MSMT BATTERY VOLTAGE: 2.96 V
MDC IDC MSMT LEADCHNL RV PACING THRESHOLD AMPLITUDE: 0.75 V
MDC IDC MSMT LEADCHNL RV PACING THRESHOLD AMPLITUDE: 0.75 V
MDC IDC MSMT LEADCHNL RV PACING THRESHOLD PULSEWIDTH: 0.5 ms
MDC IDC MSMT LEADCHNL RV SENSING INTR AMPL: 9.5 mV
MDC IDC SET LEADCHNL RV PACING AMPLITUDE: 2.5 V
MDC IDC SET LEADCHNL RV PACING PULSEWIDTH: 0.5 ms
MDC IDC STAT BRADY RV PERCENT PACED: 68 %
Pulse Gen Model: 1210
Pulse Gen Serial Number: 7313697

## 2016-12-11 LAB — POCT INR: INR: 1.9

## 2016-12-11 NOTE — Progress Notes (Signed)
Patient ID: Benjamin Mckay, male   DOB: 1946/04/24, 71 y.o.   MRN: [id number] Patient ID: Benjamin Mckay House, male   DOB: 1946-04-14, 71 y.o.   MRN: [id number]    Cardiology Office Note    Date:  12/12/2016   ID:  Benjamin Mckay, DOB 1946-03-16, MRN [id number]  PCP:  Mauricio Po, FNP  Cardiologist:   Sanda Klein, MD   Chief complaint: weakness, weight loss   History of Present Illness:  Benjamin Mckay is a 71 y.o. male with complex cardiac history (complete heart block-pacemaker dependent, chronic atrial fibrillation versus atrial standstill, history of sustained ventricular tachycardia and near syncope, coronary artery disease with history of inferior wall scar due to myocardial infarction, renal and carotid artery stenosis), amiodarone induced hyperthyroidism (now quiescent) and a recurrent right pleural effusion (s/p pleurodesis).  He faithfully monitors his weight. His home scale usually shows around 193 pounds, roughly 5 pounds less than our office scale. He has gained back some real weight with resolution of the thyrotoxicosis. He describes NYHA functional class II exertional dyspnea (climbing stairs). He has not had lower extremity edema. He needs to take a double dose of furosemide roughly once or twice a week, usually after some slight sodium indiscretion. His weight did increase around the holidays, but resolved with higher doses of diuretic.  He has now been off amiodarone for about a year. He is off anti-thyroid medications for several months. He had a pleurodesis in June. He last saw Dr. Prescott Gum in December 2017.   He denies dyspnea at rest or with usual exertion and has not had leg edema. He has not had palpitations or syncope. He denies claudication or any focal neurological events or bleeding problems.  Interrogation of his pacemaker ([place], 2013) shows several episodes of high ventricular rate over 150 bpm, but electrograms were turned off. We turned them back  on today. He only has 68% ventricular pacing. His presenting rhythm today is accelerated idioventricular rhythm, versus accelerated junctional rhythm with right bundle branch block Device function is normal. The current generator was a change out implant in 2013 and has an estimated longevity of 9.7 years. Ventricular lead parameters are excellent.  His electrocardiogram shows no visible atrial activity and regular rhythm at 79 bpm with broad QRS complex, left bundle branch block morphology with left axis deviation, QRS 138 ms. QTc interval has shortened to 428 ms (native beat).  Past Medical History:  Diagnosis Date  . Anxiety   . Atrial fibrillation ([place])   . Cardiomyopathy, ischemic 11/07/2013  . Cataract   . CHB (complete heart block) ([place]) 11/06/2013  . Chronic combined systolic and diastolic CHF (congestive heart failure) ([place])   . Colon polyps   . Colon polyps   . Congenital heart block   . Constipation   . CVA (cerebral infarction)   . Depression   . Diverticulosis   . Esophageal stricture   . Family history of adverse reaction to anesthesia   . Hyperlipidemia   . Hypertension   . Hyperthyroidism 11/16/2015  . Lymphoma ([place]) 1998   of colon   . Presence of permanent cardiac pacemaker    St. Jude  pt.states he is totally dependent on pacemaker  . Shortness of breath dyspnea    Pulmonary Effusion  . Stroke [facility])    [id number] TIA  . Umbilical hernia   . Urinary frequency     Past Surgical History:  Procedure Laterality Date  . APPENDECTOMY  [place]  01/06/2003   Recommend medical therapy  . CARDIOVASCULAR STRESS TEST  07/16/2012   Mild-moderate perfusion defect seen in Basal inferior, Mid inferior, and Apica lateral consistent with infarct/scar. No scintigraphic evidence for inducible myocardial ischemia. No ECG changes. EKG negative for ischemia.  Marland Kitchen CAROTID DOPPLER  11/04/2012   Proximal Rt ICA 50-99% diameter reduction; Lft Bulb demonstrated mild  amount homogeneous plaque-not hemodynamically significant; Lft ICA-normal patency.  . CHEST TUBE INSERTION Right 03/15/2016   Procedure: INSERTION PLEURAL DRAINAGE CATHETER;  Surgeon: Ivin Poot, MD;  Location: [facility];  Service: Thoracic;  Laterality: Right;  . COLECTOMY     for lymphoma  . COLONOSCOPY    . neck fusion    . PACEMAKER GENERATOR CHANGE  01/31/2012   St Jude Med Accent DR RF model [id number] serial [id number]  . PACEMAKER PLACEMENT     replaced 3 x  . PERMANENT PACEMAKER GENERATOR CHANGE N/A 01/31/2012   Procedure: PERMANENT PACEMAKER GENERATOR CHANGE;  Surgeon: Sanda Klein, MD;  Location: [facility] CATH LAB;   . PLEURADESIS N/A 04/02/2016   Procedure: PLEURADESIS;  Surgeon: Ivin Poot, MD;  Location: [facility];  Service: Thoracic;  Laterality: N/A;  . REMOVAL OF PLEURAL DRAINAGE CATHETER Right 07/26/2016   Procedure: REMOVAL OF PLEURAL DRAINAGE CATHETER;  Surgeon: Ivin Poot, MD;  Location: [facility];  Service: Thoracic;  Laterality: Right;  . TONSILLECTOMY    . TRANSTHORACIC ECHOCARDIOGRAM  11/04/2012   EF [id number], systolic function moderately reduced, mild regurg of the aortic and mitral valves.  Marland Kitchen VIDEO ASSISTED THORACOSCOPY Right 04/02/2016   Procedure: Right VIDEO ASSISTED THORACOSCOPY with Biopsies and drainage pleural effusion;  Surgeon: Ivin Poot, MD;  Location: [facility] OR;  Service: Thoracic;  Laterality: Right;    Current Medications: Outpatient Medications Prior to Visit  Medication Sig Dispense Refill  . acetaminophen (TYLENOL) 500 MG tablet Take 1,000 mg by mouth every 6 (six) hours as needed for moderate pain.     Marland Kitchen docusate sodium (COLACE) 100 MG capsule Take 100 mg by mouth daily as needed for mild constipation.    . furosemide (LASIX) 20 MG tablet Take 1-2 tablets by mouth daily as directed 135 tablet 1  . metoprolol succinate (TOPROL-XL) 25 MG 24 hr tablet Take 1 tablet (25 mg total) by mouth daily. 90 tablet 1  . pravastatin (PRAVACHOL) 80 MG tablet TAKE 1 TABLET  BY MOUTH EVERY DAY 90 tablet 3  . Tamsulosin HCl (FLOMAX) 0.4 MG CAPS Take 0.4 mg by mouth at bedtime.     . traZODone (DESYREL) 50 MG tablet TAKE 0.5-1 TABLETS (25-50 MG TOTAL) BY MOUTH AT BEDTIME AS NEEDED FOR SLEEP. 30 tablet 0  . warfarin (COUMADIN) 2.5 MG tablet Take 1/2 -1 tablet by mouth daily as directed by coumadin clinic 90 tablet 1   No facility-administered medications prior to visit.      Allergies:   Statins; Latex; Adhesive [tape]; and Penicillins   Social History   Social History  . Marital status: Married    Spouse name: Chartered certified accountant  . Number of children: 3  . Years of education: 29   Occupational History  . Retired    Social History Main Topics  . Smoking status: Never Smoker  . Smokeless tobacco: Never Used  . Alcohol use No  . Drug use: No  . Sexual activity: Not Asked   Other Topics Concern  . None   Social History Narrative   Fun: Golf when  he can   Consumes caffeine 2 cups per day       Family History:  The patient's family history includes Diabetes in his maternal grandmother; Heart disease in his mother; Prostate cancer in his father; Stroke in his mother.   ROS:   Please see the history of present illness.    ROS All other systems reviewed and are negative.   PHYSICAL EXAM:   VS:  BP 116/78   Pulse 79   Ht 5\' 9"  (1.753 m)   Wt 89.8 kg (198 lb)   BMI 29.24 kg/m    GEN: Well nourished, well developed, in no acute distress. He looks fit and comfortable.  HEENT: normal  Neck: no JVD, carotid bruits, or masses Cardiac: Paradoxically split second heart sound, RRR; no murmurs, rubs, or gallops,no edema, healthy pacemaker site Respiratory:  Reduced breath sounds in right lower lobe, otherwise clear to auscultation bilaterally, normal work of breathing GI: soft, nontender, nondistended, + BS MS: no deformity or atrophy  Skin: warm and dry, no rash Neuro:  Alert and Oriented x 3, Strength and sensation are intact Psych: euthymic mood,  full affect  Wt Readings from Last 3 Encounters:  12/11/16 89.8 kg (198 lb)  10/08/16 89.4 kg (197 lb)  10/03/16 86.6 kg (191 lb)      Studies/Labs Reviewed:   EKG:  EKG is ordered today. There is no atrial activity. There is Accelerated idioventricular rhythm (versus junctional rhythm with left bundle branch block). QTC 428 ms. Recent Labs: 04/05/2016: BUN 7; Creatinine, Ser 0.88; Potassium 4.0; Sodium 132 09/25/2016: ALT 8; Hemoglobin 15.9; Platelets 231.0   Lipid Panel    Component Value Date/Time   CHOL 182 07/30/2016 0900   TRIG 63 07/30/2016 0900   HDL 40 07/30/2016 0900   CHOLHDL 4.6 07/30/2016 0900   VLDL 13 07/30/2016 0900   LDLCALC 129 07/30/2016 0900     ASSESSMENT:    1. Chronic systolic CHF (congestive heart failure) ([place])   2. CHB (complete heart block) ([facility])   3. Chronic atrial fibrillation ([place])   4. Coronary artery disease involving native coronary artery of native heart without angina pectoris   5. Essential hypertension   6. Single chamber St. Jude pacemaker 2013   7. VT (ventricular tachycardia) ([place])   8. Amiodarone-induced hyperthyroidism   9. Warfarin anticoagulation   10. Renal artery stenosis ([place])   11. Bilateral carotid artery stenosis   12. Pleural effusion, right   13. Dyslipidemia   14. Medication management      PLAN:  In order of problems listed above:  1. CHF: Appears to be at euvolemic state. We'll continue current dose of diuretic. NYHA functional class I-II . Most recent echo in February shows ejection fraction of 45-50%. He did have moderate pulmonary hypertension with estimated PA pressure 54 mmHg. Continue daily weight monitoring and sodium restriction, adjusted doses of furosemide 2. CHB: Previously felt to be Pacemaker dependent, but now with an accelerated junctional or idioventricular rhythm and only 68% ventricular pacing. Upgrade to CRT device has been discussed but felt to be high-risk due to his multiple previous  surgical procedures and increased risk of infection 3. AFib: CHADSVasc 4 (age, CHF, CAD, HTN). Warfarin anticoagulation without bleeding complications and without history of stroke/TIA. Background rhythm today appears to be atrial standstill, cannot exclude very fine atrial fibrillation 4. CAD: Scar of previous inferior wall myocardial infarction, currently free of angina pectoris. CT chest documents atherosclerosis in the distribution of all 3 coronary  arteries. 5. HTN: Well controlled 6. PPM: Normal device function.   7. VT: None has been recorded since he was started on treatment with amiodarone in 2014 for an episode of near syncope and sustained monomorphic VT with a fairly slow cycle length of 370 ms (inferior scar VT?). Amiodarone has now been discontinued for 11 months without any recurrence of symptomatic ventricular arrhythmia. However, his pacemaker has recorded numerous episodes of high ventricular rates. Electrograms turned back on. As long as he does not have symptomatic VT I don't think there are any compelling reasons to treat him, considering the side effects he had in the past. 8. Amiodarone-induced  hyperthyroidism: Currently euthyroid, off amiodarone and off anti-thyroid medications  9. Anticoagulation: on warfarin 10. RAS:  Normal renal function and blood pressure  11. Carotid stenosis: Rechecked 12/2015, mild progression. Recommend we reevaluate by [place], but patient declined. 12. R pleural effusion: Exudative effusion of unclear etiology, s/p pleurodesis, resolving 13. HLP: Recheck lipids in October showed LDL above target despite maximum dose pravastatin. He has not tolerated other statins. He is uncertain whether or not he was taking the medication at the time the labs were drawn. Will recheck before his next appointment.    Medication Adjustments/Labs and Tests Ordered: Current medicines are reviewed at length with the patient today.  Concerns regarding medicines are outlined  above.  Medication changes, Labs and Tests ordered today are listed in the Patient Instructions below. Patient Instructions  Dr Sallyanne Kuster recommends that you continue on your current medications as directed. Please refer to the Current Medication list given to you today.  Your physician recommends that you return for lab work in 6 months prior to your next office visit - FASTING.  Remote monitoring is used to monitor your Pacemaker of ICD from home. This monitoring reduces the number of office visits required to check your device to one time per year. It allows [place] to keep an eye on the functioning of your device to ensure it is working properly. You are scheduled for a device check from home on Wednesday, May 30th, 2018. You may send your transmission at any time that day. If you have a wireless device, the transmission will be sent automatically. After your physician reviews your transmission, you will receive a postcard with your next transmission date.  Dr Sallyanne Kuster recommends that you schedule a follow-up appointment in 6 months with a pacemaker check. You will receive a reminder letter in the mail two months in advance. If you don't receive a letter, please call our office to schedule the follow-up appointment.  If you need a refill on your cardiac medications before your next appointment, please call your pharmacy.    Signed, Sanda Klein, MD  12/12/2016 4:13 PM    [facility] [place], [place], [place]  [address] Phone: [phone]; Fax: [phone]

## 2016-12-11 NOTE — Patient Instructions (Addendum)
Dr Sallyanne Kuster recommends that you continue on your current medications as directed. Please refer to the Current Medication list given to you today.  Your physician recommends that you return for lab work in 6 months prior to your next office visit - FASTING.  Remote monitoring is used to monitor your Pacemaker of ICD from home. This monitoring reduces the number of office visits required to check your device to one time per year. It allows [place] to keep an eye on the functioning of your device to ensure it is working properly. You are scheduled for a device check from home on Wednesday, May 30th, 2018. You may send your transmission at any time that day. If you have a wireless device, the transmission will be sent automatically. After your physician reviews your transmission, you will receive a postcard with your next transmission date.  Dr Sallyanne Kuster recommends that you schedule a follow-up appointment in 6 months with a pacemaker check. You will receive a reminder letter in the mail two months in advance. If you don't receive a letter, please call our office to schedule the follow-up appointment.  If you need a refill on your cardiac medications before your next appointment, please call your pharmacy.

## 2016-12-12 ENCOUNTER — Encounter (HOSPITAL_COMMUNITY): Payer: Self-pay | Admitting: Cardiovascular Disease

## 2016-12-15 ENCOUNTER — Other Ambulatory Visit: Payer: Self-pay | Admitting: Family

## 2016-12-16 NOTE — Telephone Encounter (Signed)
Last refill was 11/18/16

## 2016-12-18 ENCOUNTER — Telehealth: Payer: Self-pay | Admitting: *Deleted

## 2016-12-18 NOTE — Telephone Encounter (Signed)
Calling in regards to High Ventricular Rate episodes 3/1/ and 12/13/16. EGM storage turned on at appt 12/11/16 with Dr. Sallyanne Kuster. Benjamin Mckay denies symptoms of palpitations, SOB, dizziness. He is aware to call the [facility] if he experiences these symptoms.  Episodes printed for Dr. Lurline Del review.

## 2016-12-23 ENCOUNTER — Other Ambulatory Visit: Payer: Self-pay | Admitting: Cardiothoracic Surgery

## 2016-12-23 DIAGNOSIS — J910 Malignant pleural effusion: Secondary | ICD-10-CM

## 2016-12-25 ENCOUNTER — Ambulatory Visit
Admission: RE | Admit: 2016-12-25 | Discharge: 2016-12-25 | Disposition: A | Discharge status: Home / Self Care | Payer: PPO | Source: Ambulatory Visit | Attending: Cardiothoracic Surgery | Admitting: Cardiothoracic Surgery

## 2016-12-25 ENCOUNTER — Ambulatory Visit (INDEPENDENT_AMBULATORY_CARE_PROVIDER_SITE_OTHER): Payer: PPO | Admitting: Pharmacist Clinician (PhC)/ Clinical Pharmacy Specialist

## 2016-12-25 ENCOUNTER — Ambulatory Visit (INDEPENDENT_AMBULATORY_CARE_PROVIDER_SITE_OTHER): Payer: PPO | Admitting: Cardiothoracic Surgery

## 2016-12-25 ENCOUNTER — Encounter: Payer: Self-pay | Admitting: Cardiothoracic Surgery

## 2016-12-25 VITALS — BP 135/83 | HR 80 | Resp 20 | Ht 69.0 in | Wt 198.0 lb

## 2016-12-25 DIAGNOSIS — J90 Pleural effusion, not elsewhere classified: Secondary | ICD-10-CM | POA: Diagnosis not present

## 2016-12-25 DIAGNOSIS — I4821 Permanent atrial fibrillation: Secondary | ICD-10-CM

## 2016-12-25 DIAGNOSIS — I482 Chronic atrial fibrillation, unspecified: Secondary | ICD-10-CM

## 2016-12-25 DIAGNOSIS — Z09 Encounter for follow-up examination after completed treatment for conditions other than malignant neoplasm: Secondary | ICD-10-CM

## 2016-12-25 DIAGNOSIS — I4891 Unspecified atrial fibrillation: Secondary | ICD-10-CM | POA: Diagnosis not present

## 2016-12-25 DIAGNOSIS — J910 Malignant pleural effusion: Secondary | ICD-10-CM

## 2016-12-25 DIAGNOSIS — Z7901 Long term (current) use of anticoagulants: Secondary | ICD-10-CM

## 2016-12-25 LAB — POCT INR: INR: 2.2

## 2016-12-25 NOTE — Progress Notes (Signed)
PCP is Mauricio Po, FNP Referring Provider is Juanito Doom, MD  Chief Complaint  Patient presents with  . Pleural Effusion    2 month f/u with CXR    HPI:3 month follow-up with chest x-ray  Patient had previous right VATS for chronic right pleural effusion with cough and shortness of breath. Patient had a thick pleural peel over the right middle lobe and lower lobe which was only partially removable. Pathology was negative for malignancy and positive for inflammation. The patient has history of A. fib heart failure and lymphoma. CT scan performed in December showed small pleural effusion with significant pleural thickening secondary to chemical pleurodesis performed last summer. There is also entrapment of the middle lobe with atelectasis.  Currently the patient's palmar he symptoms are minimal. He denies cough chest pain or shortness of breath. He admits to a fairly sedentary lifestyle  Past Medical History:  Diagnosis Date  . Anxiety   . Atrial fibrillation ([place])   . Cardiomyopathy, ischemic 11/07/2013  . Cataract   . CHB (complete heart block) ([place]) 11/06/2013  . Chronic combined systolic and diastolic CHF (congestive heart failure) ([place])   . Colon polyps   . Colon polyps   . Congenital heart block   . Constipation   . CVA (cerebral infarction)   . Depression   . Diverticulosis   . Esophageal stricture   . Family history of adverse reaction to anesthesia   . Hyperlipidemia   . Hypertension   . Hyperthyroidism 11/16/2015  . Lymphoma ([place]) 1998   of colon   . Presence of permanent cardiac pacemaker    St. Jude  pt.states he is totally dependent on pacemaker  . Shortness of breath dyspnea    Pulmonary Effusion  . Stroke [facility])    [id number] TIA  . Umbilical hernia   . Urinary frequency     Past Surgical History:  Procedure Laterality Date  . APPENDECTOMY  1953  . CARDIAC CATHETERIZATION  01/06/2003   Recommend medical therapy  . CARDIOVASCULAR STRESS TEST   07/16/2012   Mild-moderate perfusion defect seen in Basal inferior, Mid inferior, and Apica lateral consistent with infarct/scar. No scintigraphic evidence for inducible myocardial ischemia. No ECG changes. EKG negative for ischemia.  Marland Kitchen CAROTID DOPPLER  11/04/2012   Proximal Rt ICA 50-99% diameter reduction; Lft Bulb demonstrated mild amount homogeneous plaque-not hemodynamically significant; Lft ICA-normal patency.  . CHEST TUBE INSERTION Right 03/15/2016   Procedure: INSERTION PLEURAL DRAINAGE CATHETER;  Surgeon: Ivin Poot, MD;  Location: [place];  Service: Thoracic;  Laterality: Right;  . COLECTOMY     for lymphoma  . COLONOSCOPY    . neck fusion    . PACEMAKER GENERATOR CHANGE  01/31/2012   St Jude Med Accent DR RF model [id number] serial [id number]  . PACEMAKER PLACEMENT     replaced 3 x  . PERMANENT PACEMAKER GENERATOR CHANGE N/A 01/31/2012   Procedure: PERMANENT PACEMAKER GENERATOR CHANGE;  Surgeon: Sanda Klein, MD;  Location: [place] CATH LAB;   . PLEURADESIS N/A 04/02/2016   Procedure: PLEURADESIS;  Surgeon: Ivin Poot, MD;  Location: [place];  Service: Thoracic;  Laterality: N/A;  . REMOVAL OF PLEURAL DRAINAGE CATHETER Right 07/26/2016   Procedure: REMOVAL OF PLEURAL DRAINAGE CATHETER;  Surgeon: Ivin Poot, MD;  Location: [place];  Service: Thoracic;  Laterality: Right;  . TONSILLECTOMY    . TRANSTHORACIC ECHOCARDIOGRAM  11/04/2012   EF 02-72%, systolic function moderately reduced, mild regurg of the aortic and mitral  valves.  Marland Kitchen VIDEO ASSISTED THORACOSCOPY Right 04/02/2016   Procedure: Right VIDEO ASSISTED THORACOSCOPY with Biopsies and drainage pleural effusion;  Surgeon: Ivin Poot, MD;  [facility];  Service: Thoracic;  Laterality: Right;    Family History  Problem Relation Age of Onset  . Prostate cancer Father   . Heart disease Mother   . Stroke Mother   . Diabetes Maternal Grandmother   . Colon cancer Neg Hx   . Heart attack Neg Hx     Social History Social  History  Substance Use Topics  . Smoking status: Never Smoker  . Smokeless tobacco: Never Used  . Alcohol use No    Current Outpatient Prescriptions  Medication Sig Dispense Refill  . acetaminophen (TYLENOL) 500 MG tablet Take 1,000 mg by mouth every 6 (six) hours as needed for moderate pain.     Marland Kitchen docusate sodium (COLACE) 100 MG capsule Take 100 mg by mouth daily as needed for mild constipation.    . furosemide (LASIX) 20 MG tablet Take 1-2 tablets by mouth daily as directed 135 tablet 1  . metoprolol succinate (TOPROL-XL) 25 MG 24 hr tablet Take 1 tablet (25 mg total) by mouth daily. 90 tablet 1  . pravastatin (PRAVACHOL) 80 MG tablet TAKE 1 TABLET BY MOUTH EVERY DAY 90 tablet 3  . Tamsulosin HCl (FLOMAX) 0.4 MG CAPS Take 0.4 mg by mouth at bedtime.     . traZODone (DESYREL) 50 MG tablet TAKE 0.5-1 TABLETS (25-50 MG TOTAL) BY MOUTH AT BEDTIME AS NEEDED FOR SLEEP. 30 tablet 0  . warfarin (COUMADIN) 2.5 MG tablet Take 1/2 -1 tablet by mouth daily as directed by coumadin clinic 90 tablet 1   No current facility-administered medications for this visit.     Allergies  Allergen Reactions  . Statins Other (See Comments)    Leg cramps.   Tolerates pravastatin.    . Latex Other (See Comments)    Skin irritation  . Adhesive [Tape] Other (See Comments)    Skin irritation - please use paper tape  . Penicillins Rash    Has patient had a PCN reaction causing immediate rash, facial/tongue/throat swelling, SOB or lightheadedness with hypotension: Yes Has patient had a PCN reaction causing severe rash involving mucus membranes or skin necrosis: No Has patient had a PCN reaction that required hospitalization No Has patient had a PCN reaction occurring within the last 10 years: No If all of the above answers are "NO", then may proceed with Cephalosporin use.    Review of Systems  Weight has increased after being treated for thyrotoxicosis He is followed by his cardiologist Dr. Loletha Grayer His symptoms  of heart failure and A. fib are well-controlled He remains off amiodarone because of the side effects on his thyroid function BP 135/83   Pulse 80   Resp 20   Ht 5\' 9"  (1.753 m)   Wt 198 lb (89.8 kg)   SpO2 98% Comment: RA  BMI 29.24 kg/m  Physical Exam       Exam    General- alert and comfortable   Lungs- clear without rales, wheezes   Cor- regular rate and rhythm, no murmur , gallop   Abdomen- soft, non-tender   Extremities - warm, non-tender, minimal edema   Neuro- oriented, appropriate, no focal weakness  Diagnostic Tests: Chest x-ray with findings of volume loss at the right base, stable. No significant pleural effusion. Chronic elevation right hemidiaphragm.  Impression: Stable pulmonary status  Plan: He is encouraged  to start on a disciplined walking program 20 minutes 4 days a week He'll return in 3 months with a follow-up CT scan for 6 month follow-up since his last exam. This still appears to be an inflammatory process with chronic pleural thickening and pleural peel from non-malignant etiology. Len Childs, MD [facility] [phone]

## 2016-12-26 ENCOUNTER — Telehealth: Payer: Self-pay

## 2016-12-26 ENCOUNTER — Telehealth: Payer: Self-pay | Admitting: Cardiovascular Disease

## 2016-12-26 NOTE — Telephone Encounter (Signed)
Patient's wife stated that Dr. Sallyanne Kuster wanted him to see Dr. Lovena Le but that the patient does not want to. She stated that they do not feel like Dr. Lovena Le was on board with what they wanted to do which is start Amiodarone back. She stated that they do not want a defibrillator but would like to start with Amiodarone first. Will defer this to Dr. Sallyanne Kuster.

## 2016-12-26 NOTE — Telephone Encounter (Signed)
Spoke with pt regarding elevated HR that transmitted, from 3/14 pt denied dizziness or syncope. Stated that he could sometimes feel his heart rate going up but was not concerning to him. Pt voiced understanding to call office with symptoms.

## 2016-12-26 NOTE — Telephone Encounter (Signed)
Benjamin Mckay is calling to see if it is possible to start Benjamin Mckay back on Amiodarone to see if that would help with some of his issues . Please call

## 2016-12-26 NOTE — Telephone Encounter (Signed)
I am afraid that amiodarone will certainly reactivate the thyroid problem. But I would be glad to refer to another electrophysiologist for another opinion, to discuss alternative antiarrhythmics, if he would like me to do so. I would suggest Dr. Caryl Comes.

## 2016-12-27 NOTE — Telephone Encounter (Signed)
Left message to call back  

## 2016-12-27 NOTE — Telephone Encounter (Signed)
Spoke to patient .  patient states he does not have a problem with Dr Lovena Le. Patient states he is not interested in a having an implanted defibrillator.    Patient thought it may be another medication that can be used beside  Amiodarone or restart  medicaton since thyroid level is better. Patient states Dr Lovena Le did not discuss other medication options and also states he would not recommend defibrillator.  Patient is aware will need to refer back to Dr Delrae Alfred , patient states he will be out of town for a week and contact once he returns

## 2017-01-08 ENCOUNTER — Encounter: Payer: Self-pay | Admitting: Family Medicine

## 2017-01-08 ENCOUNTER — Ambulatory Visit (INDEPENDENT_AMBULATORY_CARE_PROVIDER_SITE_OTHER): Payer: PPO | Admitting: Family Medicine

## 2017-01-08 VITALS — BP 140/80 | HR 71 | Temp 97.9°F | Wt 203.1 lb

## 2017-01-08 DIAGNOSIS — J069 Acute upper respiratory infection, unspecified: Secondary | ICD-10-CM | POA: Diagnosis not present

## 2017-01-08 DIAGNOSIS — B9789 Other viral agents as the cause of diseases classified elsewhere: Secondary | ICD-10-CM

## 2017-01-08 NOTE — Progress Notes (Signed)
Pre visit review using our clinic review tool, if applicable. No additional management support is needed unless otherwise documented below in the visit note. 

## 2017-01-08 NOTE — Patient Instructions (Signed)
Upper Respiratory Infection, Adult Most upper respiratory infections (URIs) are a viral infection of the air passages leading to the lungs. A URI affects the nose, throat, and upper air passages. The most common type of URI is nasopharyngitis and is typically referred to as "the common cold." URIs run their course and usually go away on their own. Most of the time, a URI does not require medical attention, but sometimes a bacterial infection in the upper airways can follow a viral infection. This is called a secondary infection. Sinus and middle ear infections are common types of secondary upper respiratory infections. Bacterial pneumonia can also complicate a URI. A URI can worsen asthma and chronic obstructive pulmonary disease (COPD). Sometimes, these complications can require emergency medical care and may be life threatening. What are the causes? Almost all URIs are caused by viruses. A virus is a type of germ and can spread from one person to another. What increases the risk? You may be at risk for a URI if:  You smoke.  You have chronic heart or lung disease.  You have a weakened defense (immune) system.  You are very young or very old.  You have nasal allergies or asthma.  You work in crowded or poorly ventilated areas.  You work in health care facilities or schools.  What are the signs or symptoms? Symptoms typically develop 2-3 days after you come in contact with a cold virus. Most viral URIs last 7-10 days. However, viral URIs from the influenza virus (flu virus) can last 14-18 days and are typically more severe. Symptoms may include:  Runny or stuffy (congested) nose.  Sneezing.  Cough.  Sore throat.  Headache.  Fatigue.  Fever.  Loss of appetite.  Pain in your forehead, behind your eyes, and over your cheekbones (sinus pain).  Muscle aches.  How is this diagnosed? Your health care provider may diagnose a URI by:  Physical exam.  Tests to check that your  symptoms are not due to another condition such as: ? Strep throat. ? Sinusitis. ? Pneumonia. ? Asthma.  How is this treated? A URI goes away on its own with time. It cannot be cured with medicines, but medicines may be prescribed or recommended to relieve symptoms. Medicines may help:  Reduce your fever.  Reduce your cough.  Relieve nasal congestion.  Follow these instructions at home:  Take medicines only as directed by your health care provider.  Gargle warm saltwater or take cough drops to comfort your throat as directed by your health care provider.  Use a warm mist humidifier or inhale steam from a shower to increase air moisture. This may make it easier to breathe.  Drink enough fluid to keep your urine clear or pale yellow.  Eat soups and other clear broths and maintain good nutrition.  Rest as needed.  Return to work when your temperature has returned to normal or as your health care provider advises. You may need to stay home longer to avoid infecting others. You can also use a face mask and careful hand washing to prevent spread of the virus.  Increase the usage of your inhaler if you have asthma.  Do not use any tobacco products, including cigarettes, chewing tobacco, or electronic cigarettes. If you need help quitting, ask your health care provider. How is this prevented? The best way to protect yourself from getting a cold is to practice good hygiene.  Avoid oral or hand contact with people with cold symptoms.  Wash your   hands often if contact occurs.  There is no clear evidence that vitamin C, vitamin E, echinacea, or exercise reduces the chance of developing a cold. However, it is always recommended to get plenty of rest, exercise, and practice good nutrition. Contact a health care provider if:  You are getting worse rather than better.  Your symptoms are not controlled by medicine.  You have chills.  You have worsening shortness of breath.  You have  brown or red mucus.  You have yellow or brown nasal discharge.  You have pain in your face, especially when you bend forward.  You have a fever.  You have swollen neck glands.  You have pain while swallowing.  You have white areas in the back of your throat. Get help right away if:  You have severe or persistent: ? Headache. ? Ear pain. ? Sinus pain. ? Chest pain.  You have chronic lung disease and any of the following: ? Wheezing. ? Prolonged cough. ? Coughing up blood. ? A change in your usual mucus.  You have a stiff neck.  You have changes in your: ? Vision. ? Hearing. ? Thinking. ? Mood. This information is not intended to replace advice given to you by your health care provider. Make sure you discuss any questions you have with your health care provider. Document Released: 03/26/2001 Document Revised: 06/02/2016 Document Reviewed: 01/05/2014 Elsevier Interactive Patient Education  2017 Elsevier Inc.  

## 2017-01-08 NOTE — Progress Notes (Signed)
Subjective:     Patient ID: Benjamin Mckay, male   DOB: 1946-05-03, 71 y.o.   MRN: [id number]  HPI Patient seen with onset this past Sunday of upper respiratory symptoms. He just returned then from a cruise. He's had some nasal congestion and mostly nonproductive cough. No fevers or chills. No sore throat. Denies any nausea, vomiting, or diarrhea. He had right pleural effusion which required chest tube last year. He has chronic diminished breath sounds on that side. Never smoked. Effusion was nonmalignant. Remote history of lymphoma of the intestine. Generally feels well. No hemoptysis. No appetite or weight changes.  Past Medical History:  Diagnosis Date  . Anxiety   . Atrial fibrillation ([place])   . Cardiomyopathy, ischemic 11/07/2013  . Cataract   . CHB (complete heart block) ([place]) 11/06/2013  . Chronic combined systolic and diastolic CHF (congestive heart failure) ([place])   . Colon polyps   . Colon polyps   . Congenital heart block   . Constipation   . CVA (cerebral infarction)   . Depression   . Diverticulosis   . Esophageal stricture   . Family history of adverse reaction to anesthesia   . Hyperlipidemia   . Hypertension   . Hyperthyroidism 11/16/2015  . Lymphoma ([place]) 1998   of colon   . Presence of permanent cardiac pacemaker    St. Jude  pt.states he is totally dependent on pacemaker  . Shortness of breath dyspnea    Pulmonary Effusion  . Stroke [facility])    [id number] TIA  . Umbilical hernia   . Urinary frequency    Past Surgical History:  Procedure Laterality Date  . APPENDECTOMY  1953  . CARDIAC CATHETERIZATION  01/06/2003   Recommend medical therapy  . CARDIOVASCULAR STRESS TEST  07/16/2012   Mild-moderate perfusion defect seen in Basal inferior, Mid inferior, and Apica lateral consistent with infarct/scar. No scintigraphic evidence for inducible myocardial ischemia. No ECG changes. EKG negative for ischemia.  Marland Kitchen CAROTID DOPPLER  11/04/2012   Proximal Rt ICA 50-99% diameter  reduction; Lft Bulb demonstrated mild amount homogeneous plaque-not hemodynamically significant; Lft ICA-normal patency.  . CHEST TUBE INSERTION Right 03/15/2016   Procedure: INSERTION PLEURAL DRAINAGE CATHETER;  Surgeon: Ivin Poot, MD;  Location: [place];  Service: Thoracic;  Laterality: Right;  . COLECTOMY     for lymphoma  . COLONOSCOPY    . neck fusion    . PACEMAKER GENERATOR CHANGE  01/31/2012   St Jude Med Accent DR RF model [id number] serial [id number]  . PACEMAKER PLACEMENT     replaced 3 x  . PERMANENT PACEMAKER GENERATOR CHANGE N/A 01/31/2012   Procedure: PERMANENT PACEMAKER GENERATOR CHANGE;  Surgeon: Sanda Klein, MD;  Location: [place] CATH LAB;   . PLEURADESIS N/A 04/02/2016   Procedure: PLEURADESIS;  Surgeon: Ivin Poot, MD;  Location: [place];  Service: Thoracic;  Laterality: N/A;  . REMOVAL OF PLEURAL DRAINAGE CATHETER Right 07/26/2016   Procedure: REMOVAL OF PLEURAL DRAINAGE CATHETER;  Surgeon: Ivin Poot, MD;  Location: [place];  Service: Thoracic;  Laterality: Right;  . TONSILLECTOMY    . TRANSTHORACIC ECHOCARDIOGRAM  11/04/2012   EF 82-95%, systolic function moderately reduced, mild regurg of the aortic and mitral valves.  Marland Kitchen VIDEO ASSISTED THORACOSCOPY Right 04/02/2016   Procedure: Right VIDEO ASSISTED THORACOSCOPY with Biopsies and drainage pleural effusion;  Surgeon: Ivin Poot, MD;  Location: [place];  Service: Thoracic;  Laterality: Right;    reports that he has never smoked. He  has never used smokeless tobacco. He reports that he does not drink alcohol or use drugs. family history includes Diabetes in his maternal grandmother; Heart disease in his mother; Prostate cancer in his father; Stroke in his mother. Allergies  Allergen Reactions  . Statins Other (See Comments)    Leg cramps.   Tolerates pravastatin.    . Latex Other (See Comments)    Skin irritation  . Adhesive [Tape] Other (See Comments)    Skin irritation - please use paper tape  . Penicillins Rash     Has patient had a PCN reaction causing immediate rash, facial/tongue/throat swelling, SOB or lightheadedness with hypotension: Yes Has patient had a PCN reaction causing severe rash involving mucus membranes or skin necrosis: No Has patient had a PCN reaction that required hospitalization No Has patient had a PCN reaction occurring within the last 10 years: No If all of the above answers are "NO", then may proceed with Cephalosporin use.     Review of Systems  Constitutional: Negative for chills and fever.  HENT: Positive for congestion.   Respiratory: Positive for cough.        Objective:   Physical Exam  Constitutional: He appears well-developed and well-nourished.  HENT:  Right Ear: External ear normal.  Left Ear: External ear normal.  Mouth/Throat: Oropharynx is clear and moist.  Neck: Neck supple.  Cardiovascular: Normal rate and regular rhythm.   Pulmonary/Chest: Effort normal. He has no wheezes.  Patient has diminished breath sounds right base compared with left. No wheezes. No rales.  Musculoskeletal: He exhibits no edema.  Lymphadenopathy:    He has no cervical adenopathy.       Assessment:     Probable viral URI with cough. He has some chronic asymmetric breath sounds related to previous chronic right pleural effusion but no acute changes    Plan:     -Recommend observation along with plenty of fluids and rest. -Follow-up promptly for any fever, increased shortness of breath, or other concerns.  Eulas Post MD [facility]

## 2017-01-13 ENCOUNTER — Other Ambulatory Visit: Payer: Self-pay | Admitting: Family

## 2017-01-17 ENCOUNTER — Other Ambulatory Visit: Payer: Self-pay | Admitting: Cardiovascular Disease

## 2017-01-20 ENCOUNTER — Telehealth: Payer: Self-pay | Admitting: *Deleted

## 2017-01-20 ENCOUNTER — Encounter: Payer: Self-pay | Admitting: Cardiovascular Disease

## 2017-01-20 NOTE — Telephone Encounter (Signed)
Episodes are definitely VT, but usually are minimally symptomati. Previous well suppressed with amiodarone. Unfortunately, he developed serious complications with amiodarone. QT is long. I requested an appt with Dr. Lovena Le to look for other solutions, but it hasn't happened yet. [place], can you please follow up and see where that appt stands? MCr

## 2017-01-20 NOTE — Telephone Encounter (Signed)
Spoke with patient regarding high ventricular rate episodes noted on remote PPM transmission.  Of 11 episodes with EGMs, nine were on 01/19/17.  Morphology change from presenting rhythm noted on available channel (V bipolar).  Patient reports that he noticed his heart racing intermittently throughout the day.  He states he "felt" episodes but was largely able to "ignore" them.  He denies experiencing ShOB, dizziness, chest discomfort, or palpitations.  Patient reports he is taking his Toprol-XL and warfarin as prescribed.  Advised that I will forward this message to Dr. Sallyanne Kuster for any additional recommendations.  Patient verbalizes understanding and denies additional questions or concerns at this time.

## 2017-01-22 ENCOUNTER — Ambulatory Visit (INDEPENDENT_AMBULATORY_CARE_PROVIDER_SITE_OTHER): Payer: PPO | Admitting: Pharmacist Clinician (PhC)/ Clinical Pharmacy Specialist

## 2017-01-22 DIAGNOSIS — I482 Chronic atrial fibrillation, unspecified: Secondary | ICD-10-CM

## 2017-01-22 DIAGNOSIS — Z7901 Long term (current) use of anticoagulants: Secondary | ICD-10-CM

## 2017-01-22 DIAGNOSIS — I4891 Unspecified atrial fibrillation: Secondary | ICD-10-CM | POA: Diagnosis not present

## 2017-01-22 DIAGNOSIS — I4821 Permanent atrial fibrillation: Secondary | ICD-10-CM

## 2017-01-22 LAB — POCT INR: INR: 2.7

## 2017-01-23 NOTE — Telephone Encounter (Signed)
Routed to scheduler for assistance scheduling appointment with Dr. Lovena Le.

## 2017-01-24 NOTE — Telephone Encounter (Signed)
Staff messages sent to EP scheduler (01/21/17) and NL admin (01/22/17).

## 2017-01-31 IMAGING — CR DG CHEST 2V
2 series · 2 of 2 positions shown · non-contrast
Comparison: May 15, 2016

CLINICAL DATA: Previous VATS procedure. Chest pain and shortness of
breath.

EXAM:
CHEST  2 VIEW

[view not recorded (1 of 2)]
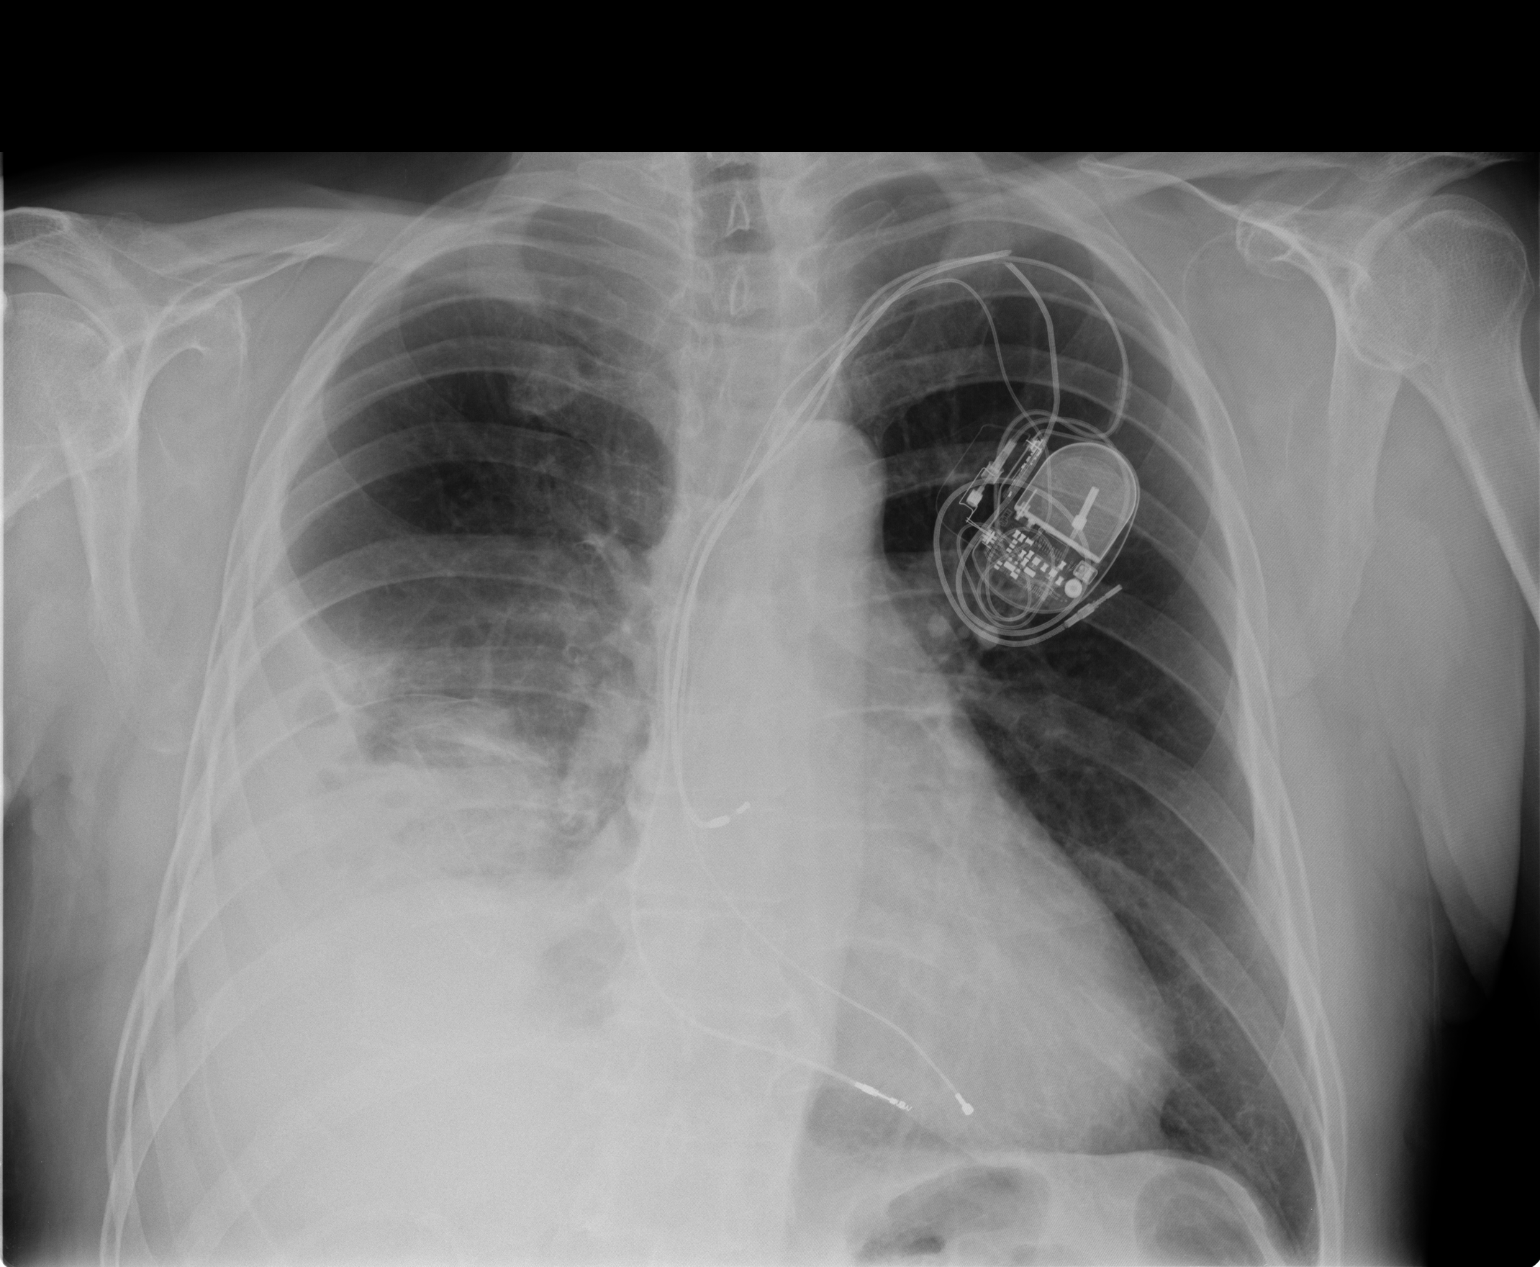

[view not recorded (2 of 2)]
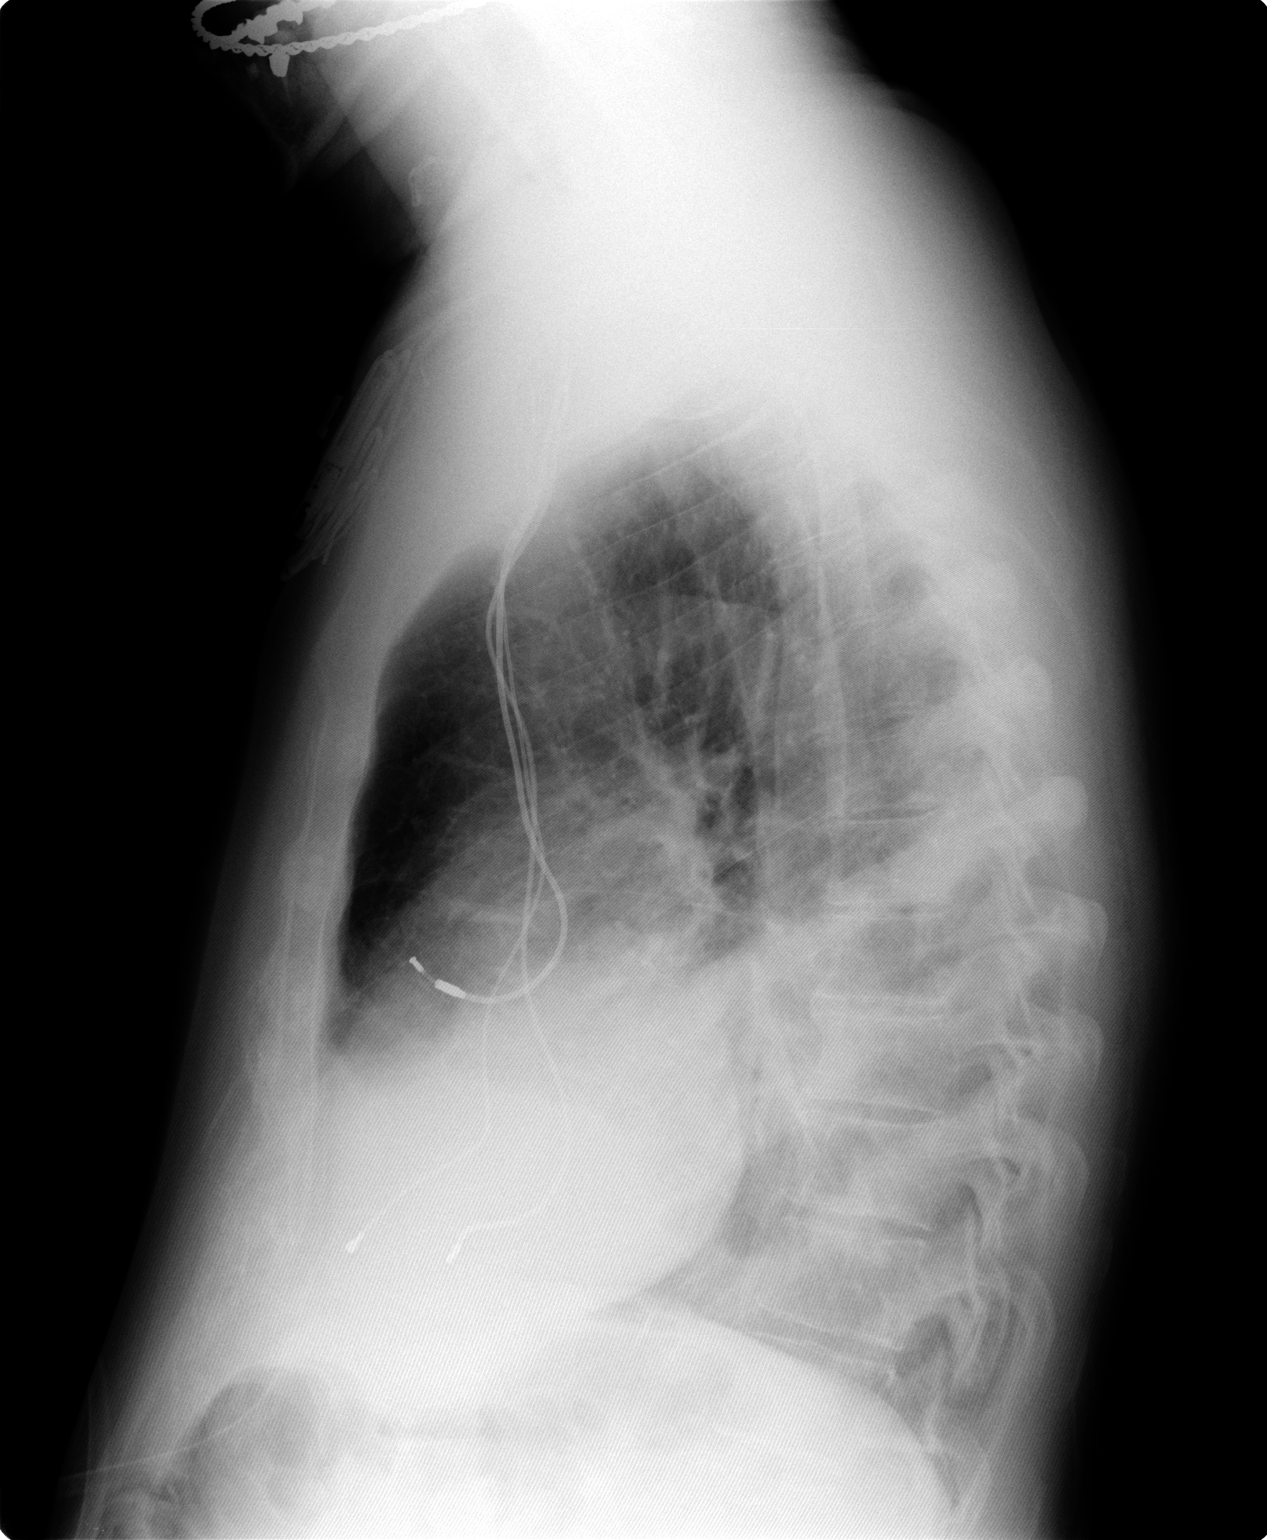

[2 of 2 positions shown; findings below may reference images not displayed]

FINDINGS: There remains a fairly sizable apparently partially loculated
pleural effusion on the right with several small foci of air within
this loculated fluid. There is also patchy consolidation throughout
much of the right middle and lower lobes. Left lung is clear. Heart
is upper normal in size with pulmonary vascularity within normal
limits. Pacemaker leads are attached to the right atrium and right
ventricle. Pneumothorax. No adenopathy. No bone lesions evident.
IMPRESSION: There is somewhat less air within loculated fluid on the right.
There is a moderate loculated pleural effusion with areas of
consolidation throughout portions of the right middle and lower lobe
regions. No new opacity evident. Left lung remains clear. Stable
cardiac silhouette.

## 2017-01-31 NOTE — Telephone Encounter (Signed)
Appt made for 02/03/17 at 8:30a with Dr Lovena Le.

## 2017-02-03 ENCOUNTER — Ambulatory Visit (INDEPENDENT_AMBULATORY_CARE_PROVIDER_SITE_OTHER): Payer: PPO | Admitting: Internal Medicine

## 2017-02-03 ENCOUNTER — Encounter: Payer: Self-pay | Admitting: Internal Medicine

## 2017-02-03 VITALS — BP 140/80 | HR 70 | Ht 69.0 in | Wt 207.5 lb

## 2017-02-03 DIAGNOSIS — I482 Chronic atrial fibrillation: Secondary | ICD-10-CM | POA: Diagnosis not present

## 2017-02-03 DIAGNOSIS — I5022 Chronic systolic (congestive) heart failure: Secondary | ICD-10-CM | POA: Diagnosis not present

## 2017-02-03 DIAGNOSIS — I10 Essential (primary) hypertension: Secondary | ICD-10-CM | POA: Diagnosis not present

## 2017-02-03 DIAGNOSIS — I442 Atrioventricular block, complete: Secondary | ICD-10-CM | POA: Diagnosis not present

## 2017-02-03 DIAGNOSIS — I472 Ventricular tachycardia, unspecified: Secondary | ICD-10-CM

## 2017-02-03 DIAGNOSIS — I5023 Acute on chronic systolic (congestive) heart failure: Secondary | ICD-10-CM

## 2017-02-03 DIAGNOSIS — I4821 Permanent atrial fibrillation: Secondary | ICD-10-CM

## 2017-02-03 MED ORDER — METOPROLOL SUCCINATE ER 25 MG PO TB24: 25.0000 mg | ORAL_TABLET | Freq: Two times a day (BID) | ORAL | 3 refills | Status: DC

## 2017-02-03 NOTE — Progress Notes (Signed)
HPI [name] returns today for ongoing evaluation. He is a pleasant 71 yo man with multiple medical problems including an ICM, chronic systolic heart failure with variable EF's, s/p inferior MI, with atrial fib, s/p PPM insertion. He has had problems with VT and was placed on amiodarone but developed lung toxicity. He has palpitations but has not had syncope, although he had near syncope remotely. He feels like his heart racing has improved or is at least less symptomatic. Repeat echo demonstrated an EF of 45%. He lost weight but has gained some of it back. His blood pressure remains a little high.   Allergies  Allergen Reactions  . Statins Other (See Comments)    Leg cramps.   Tolerates pravastatin.    . Latex Other (See Comments)    Skin irritation  . Adhesive [Tape] Other (See Comments)    Skin irritation - please use paper tape  . Penicillins Rash    Has patient had a PCN reaction causing immediate rash, facial/tongue/throat swelling, SOB or lightheadedness with hypotension: Yes Has patient had a PCN reaction causing severe rash involving mucus membranes or skin necrosis: No Has patient had a PCN reaction that required hospitalization No Has patient had a PCN reaction occurring within the last 10 years: No If all of the above answers are "NO", then may proceed with Cephalosporin use.     Current Outpatient Prescriptions  Medication Sig Dispense Refill  . acetaminophen (TYLENOL) 500 MG tablet Take 1,000 mg by mouth every 6 (six) hours as needed for moderate pain.     . furosemide (LASIX) 20 MG tablet Take 1-2 tablets by mouth daily as directed 135 tablet 1  . metoprolol succinate (TOPROL-XL) 25 MG 24 hr tablet Take 1 tablet (25 mg total) by mouth 2 (two) times daily. 180 tablet 3  . pravastatin (PRAVACHOL) 80 MG tablet TAKE 1 TABLET BY MOUTH EVERY DAY 90 tablet 3  . Tamsulosin HCl (FLOMAX) 0.4 MG CAPS Take 0.4 mg by mouth at bedtime.     . traZODone (DESYREL) 50 MG tablet  TAKE 0.5-1 TABLETS (25-50 MG TOTAL) BY MOUTH AT BEDTIME AS NEEDED FOR SLEEP. 30 tablet 0  . warfarin (COUMADIN) 2.5 MG tablet Take 1/2 -1 tablet by mouth daily as directed by coumadin clinic 90 tablet 1   No current facility-administered medications for this visit.      Past Medical History:  Diagnosis Date  . Anxiety   . Atrial fibrillation ([place])   . Cardiomyopathy, ischemic 11/07/2013  . Cataract   . CHB (complete heart block) ([place]) 11/06/2013  . Chronic combined systolic and diastolic CHF (congestive heart failure) ([place])   . Colon polyps   . Colon polyps   . Congenital heart block   . Constipation   . CVA (cerebral infarction)   . Depression   . Diverticulosis   . Esophageal stricture   . Family history of adverse reaction to anesthesia   . Hyperlipidemia   . Hypertension   . Hyperthyroidism 11/16/2015  . Lymphoma ([place]) 1998   of colon   . Presence of permanent cardiac pacemaker    St. Jude  pt.states he is totally dependent on pacemaker  . Shortness of breath dyspnea    Pulmonary Effusion  . Stroke [facility])    [id number] TIA  . Umbilical hernia   . Urinary frequency     ROS:   All systems reviewed and negative except as noted in the HPI.   Past Surgical  History:  Procedure Laterality Date  . APPENDECTOMY  1953  . CARDIAC CATHETERIZATION  01/06/2003   Recommend medical therapy  . CARDIOVASCULAR STRESS TEST  07/16/2012   Mild-moderate perfusion defect seen in Basal inferior, Mid inferior, and Apica lateral consistent with infarct/scar. No scintigraphic evidence for inducible myocardial ischemia. No ECG changes. EKG negative for ischemia.  Marland Kitchen CAROTID DOPPLER  11/04/2012   Proximal Rt ICA 50-99% diameter reduction; Lft Bulb demonstrated mild amount homogeneous plaque-not hemodynamically significant; Lft ICA-normal patency.  . CHEST TUBE INSERTION Right 03/15/2016   Procedure: INSERTION PLEURAL DRAINAGE CATHETER;  Surgeon: Ivin Poot, MD;  Location: [place];  Service:  Thoracic;  Laterality: Right;  . COLECTOMY     for lymphoma  . COLONOSCOPY    . neck fusion    . PACEMAKER GENERATOR CHANGE  01/31/2012   St Jude Med Accent DR RF model [id number] serial [id number]  . PACEMAKER PLACEMENT     replaced 3 x  . PERMANENT PACEMAKER GENERATOR CHANGE N/A 01/31/2012   Procedure: PERMANENT PACEMAKER GENERATOR CHANGE;  Surgeon: Sanda Klein, MD;  Location: [place] CATH LAB;   . PLEURADESIS N/A 04/02/2016   Procedure: PLEURADESIS;  Surgeon: Ivin Poot, MD;  Location: [place];  Service: Thoracic;  Laterality: N/A;  . REMOVAL OF PLEURAL DRAINAGE CATHETER Right 07/26/2016   Procedure: REMOVAL OF PLEURAL DRAINAGE CATHETER;  Surgeon: Ivin Poot, MD;  Location: [place];  Service: Thoracic;  Laterality: Right;  . TONSILLECTOMY    . TRANSTHORACIC ECHOCARDIOGRAM  11/04/2012   EF 22-02%, systolic function moderately reduced, mild regurg of the aortic and mitral valves.  Marland Kitchen VIDEO ASSISTED THORACOSCOPY Right 04/02/2016   Procedure: Right VIDEO ASSISTED THORACOSCOPY with Biopsies and drainage pleural effusion;  Surgeon: Ivin Poot, MD;  Location: [facility] OR;  Service: Thoracic;  Laterality: Right;     Family History  Problem Relation Age of Onset  . Prostate cancer Father   . Heart disease Mother   . Stroke Mother   . Diabetes Maternal Grandmother   . Colon cancer Neg Hx   . Heart attack Neg Hx      Social History   Social History  . Marital status: Married    Spouse name: Chartered certified accountant  . Number of children: 3  . Years of education: 11   Occupational History  . Retired    Social History Main Topics  . Smoking status: Never Smoker  . Smokeless tobacco: Never Used  . Alcohol use No  . Drug use: No  . Sexual activity: Not on file   Other Topics Concern  . Not on file   Social History Narrative   Fun: Golf when he can   Consumes caffeine 2 cups per day       BP 140/80   Pulse 70   Ht 5\' 9"  (1.753 m)   Wt 207 lb 8 oz (94.1 kg)   SpO2 95%   BMI 30.64  kg/m   Physical Exam:  Well appearing 71 year old man, NAD HEENT: Unremarkable Neck:  7 cm JVD, no thyromegally Lymphatics:  No adenopathy Back:  No CVA tenderness Lungs:  Clear, with no wheezes, rales, or rhonchi. Multiple pacemaker scars appear well healed, and the skin is not thin. HEART:  Regular rate rhythm, no murmurs, no rubs, no clicks Abd:  soft, positive bowel sounds, no organomegally, no rebound, no guarding Ext:  2 plus pulses, no edema, no cyanosis, no clubbing Skin:  No rashes no nodules Neuro:  CN II  through XII intact, motor grossly intact   DEVICE  Normal device function.  See PaceArt for details.   Assess/Plan: 1. Atrial fib - he appears to have some periods of RVR. I have recommended uptitration of his toprol.  2. VT - it appears to be stable. We discussed treatment options. I do not think ICD or another AA drug is warranted at this time. I have encouraged him to notify me if he has syncope.  3. CAD - he denies anginal symptoms.  4. PPM - his device has been interogated today. He is pacing about half the time and he has some RVR on his device.   Mikle Bosworth.D.

## 2017-02-03 NOTE — Patient Instructions (Addendum)
Medication Instructions:  Increase Toprol (metoprolol succinate) to 25mg  two times a day  Labwork: None  Testing/Procedures: None   Follow-Up: Your physician recommends that you schedule a follow-up appointment as needed with Dr Lovena Le.  Continue to do your remote device checks from home.       If you need a refill on your cardiac medications before your next appointment, please call your pharmacy.

## 2017-02-05 ENCOUNTER — Other Ambulatory Visit: Payer: Self-pay | Admitting: Family

## 2017-02-06 LAB — CUP PACEART INCLINIC DEVICE CHECK
Battery Voltage: 2.93 V
Brady Statistic RV Percent Paced: 46 %
Date Time Interrogation Session: 20180423130224
Implantable Lead Location: 753860
Lead Channel Impedance Value: 475 Ohm
Lead Channel Sensing Intrinsic Amplitude: 12 mV
Lead Channel Setting Pacing Amplitude: 2.5 V
Lead Channel Setting Pacing Pulse Width: 0.5 ms
Lead Channel Setting Sensing Sensitivity: 2.5 mV
MDC IDC LEAD IMPLANT DT: 20130419
MDC IDC MSMT LEADCHNL RV PACING THRESHOLD AMPLITUDE: 0.75 V
MDC IDC MSMT LEADCHNL RV PACING THRESHOLD PULSEWIDTH: 0.5 ms
MDC IDC PG IMPLANT DT: 20130419
Pulse Gen Model: 1210
Pulse Gen Serial Number: 7313697

## 2017-02-13 ENCOUNTER — Other Ambulatory Visit: Payer: Self-pay | Admitting: Cardiovascular Disease

## 2017-02-17 ENCOUNTER — Ambulatory Visit (INDEPENDENT_AMBULATORY_CARE_PROVIDER_SITE_OTHER): Payer: PPO | Admitting: Pharmacist

## 2017-02-17 DIAGNOSIS — I4891 Unspecified atrial fibrillation: Secondary | ICD-10-CM | POA: Diagnosis not present

## 2017-02-17 DIAGNOSIS — Z7901 Long term (current) use of anticoagulants: Secondary | ICD-10-CM

## 2017-02-17 DIAGNOSIS — I482 Chronic atrial fibrillation, unspecified: Secondary | ICD-10-CM

## 2017-02-17 DIAGNOSIS — I4821 Permanent atrial fibrillation: Secondary | ICD-10-CM

## 2017-02-17 LAB — POCT INR: INR: 3.2

## 2017-02-17 MED ORDER — WARFARIN SODIUM 2.5 MG PO TABS: ORAL_TABLET | ORAL | 1 refills | Status: DC

## 2017-02-17 MED ORDER — PRAVASTATIN SODIUM 80 MG PO TABS: 80.0000 mg | ORAL_TABLET | Freq: Every day | ORAL | 2 refills | Status: DC

## 2017-03-05 ENCOUNTER — Other Ambulatory Visit: Payer: Self-pay | Admitting: Cardiothoracic Surgery

## 2017-03-05 DIAGNOSIS — J90 Pleural effusion, not elsewhere classified: Secondary | ICD-10-CM

## 2017-03-12 ENCOUNTER — Other Ambulatory Visit: Payer: Self-pay | Admitting: Cardiovascular Disease

## 2017-03-12 ENCOUNTER — Ambulatory Visit (INDEPENDENT_AMBULATORY_CARE_PROVIDER_SITE_OTHER): Payer: PPO | Admitting: *Deleted

## 2017-03-12 DIAGNOSIS — I482 Chronic atrial fibrillation, unspecified: Secondary | ICD-10-CM

## 2017-03-14 ENCOUNTER — Other Ambulatory Visit: Payer: Self-pay | Admitting: Cardiovascular Disease

## 2017-03-14 NOTE — Progress Notes (Signed)
Remote pacemaker transmission.   

## 2017-03-17 ENCOUNTER — Ambulatory Visit (INDEPENDENT_AMBULATORY_CARE_PROVIDER_SITE_OTHER): Payer: PPO | Admitting: Pharmacist Clinician (PhC)/ Clinical Pharmacy Specialist

## 2017-03-17 DIAGNOSIS — I4891 Unspecified atrial fibrillation: Secondary | ICD-10-CM | POA: Diagnosis not present

## 2017-03-17 DIAGNOSIS — I4821 Permanent atrial fibrillation: Secondary | ICD-10-CM

## 2017-03-17 DIAGNOSIS — Z7901 Long term (current) use of anticoagulants: Secondary | ICD-10-CM

## 2017-03-17 DIAGNOSIS — I482 Chronic atrial fibrillation, unspecified: Secondary | ICD-10-CM

## 2017-03-17 LAB — POCT INR: INR: 2.3

## 2017-03-20 ENCOUNTER — Other Ambulatory Visit: Payer: Self-pay | Admitting: Family

## 2017-03-21 ENCOUNTER — Encounter: Payer: Self-pay | Admitting: Cardiology

## 2017-03-21 NOTE — Progress Notes (Signed)
Letter  

## 2017-03-25 LAB — CUP PACEART REMOTE DEVICE CHECK
Battery Remaining Percentage: 95.5 %
Battery Voltage: 2.96 V
Date Time Interrogation Session: 20180601060013
Implantable Lead Location: 753860
Implantable Pulse Generator Implant Date: 20130419
Lead Channel Impedance Value: 440 Ohm
Lead Channel Pacing Threshold Pulse Width: 0.5 ms
Lead Channel Setting Pacing Amplitude: 2.5 V
Lead Channel Setting Pacing Pulse Width: 0.5 ms
Lead Channel Setting Sensing Sensitivity: 2.5 mV
MDC IDC LEAD IMPLANT DT: 20130419
MDC IDC MSMT BATTERY REMAINING LONGEVITY: 118 mo
MDC IDC MSMT LEADCHNL RV PACING THRESHOLD AMPLITUDE: 0.75 V
MDC IDC MSMT LEADCHNL RV SENSING INTR AMPL: 8.6 mV
MDC IDC STAT BRADY RV PERCENT PACED: 52 %
Pulse Gen Model: 1210
Pulse Gen Serial Number: 7313697

## 2017-04-02 ENCOUNTER — Other Ambulatory Visit: Payer: Self-pay | Admitting: Cardiothoracic Surgery

## 2017-04-02 DIAGNOSIS — J90 Pleural effusion, not elsewhere classified: Secondary | ICD-10-CM

## 2017-04-09 ENCOUNTER — Other Ambulatory Visit: Payer: PPO

## 2017-04-09 ENCOUNTER — Ambulatory Visit: Payer: PPO | Admitting: Cardiothoracic Surgery

## 2017-04-22 ENCOUNTER — Ambulatory Visit (INDEPENDENT_AMBULATORY_CARE_PROVIDER_SITE_OTHER): Payer: PPO | Admitting: Pharmacist

## 2017-04-22 DIAGNOSIS — I4891 Unspecified atrial fibrillation: Secondary | ICD-10-CM | POA: Diagnosis not present

## 2017-04-22 DIAGNOSIS — I482 Chronic atrial fibrillation, unspecified: Secondary | ICD-10-CM

## 2017-04-22 DIAGNOSIS — I4821 Permanent atrial fibrillation: Secondary | ICD-10-CM

## 2017-04-22 DIAGNOSIS — Z7901 Long term (current) use of anticoagulants: Secondary | ICD-10-CM | POA: Diagnosis not present

## 2017-04-22 LAB — POCT INR: INR: 2.4

## 2017-04-23 ENCOUNTER — Ambulatory Visit: Payer: PPO | Admitting: Cardiothoracic Surgery

## 2017-04-23 ENCOUNTER — Other Ambulatory Visit: Payer: PPO

## 2017-04-23 ENCOUNTER — Ambulatory Visit
Admission: RE | Admit: 2017-04-23 | Discharge: 2017-04-23 | Disposition: A | Discharge status: Home / Self Care | Payer: PPO | Source: Ambulatory Visit | Attending: Cardiothoracic Surgery | Admitting: Cardiothoracic Surgery

## 2017-04-23 DIAGNOSIS — J90 Pleural effusion, not elsewhere classified: Secondary | ICD-10-CM

## 2017-05-08 ENCOUNTER — Ambulatory Visit (INDEPENDENT_AMBULATORY_CARE_PROVIDER_SITE_OTHER): Payer: PPO | Admitting: Cardiothoracic Surgery

## 2017-05-08 ENCOUNTER — Encounter: Payer: Self-pay | Admitting: Cardiothoracic Surgery

## 2017-05-08 VITALS — BP 130/84 | HR 110 | Resp 20 | Ht 69.0 in | Wt 206.0 lb

## 2017-05-08 DIAGNOSIS — J181 Lobar pneumonia, unspecified organism: Secondary | ICD-10-CM | POA: Diagnosis not present

## 2017-05-08 DIAGNOSIS — Z09 Encounter for follow-up examination after completed treatment for conditions other than malignant neoplasm: Secondary | ICD-10-CM | POA: Diagnosis not present

## 2017-05-08 DIAGNOSIS — J90 Pleural effusion, not elsewhere classified: Secondary | ICD-10-CM

## 2017-05-08 NOTE — Progress Notes (Signed)
PCP is Golden Circle, FNP Referring Provider is Juanito Doom, MD  Chief Complaint  Patient presents with  . Follow-up    6 month f/u with Chest CT 04/23/17, Persistent parenchymal consolidation     HPI: Scheduled office visit with CT scan of the chest to follow-up chronic pleural effusion and pleural thickening with atelectasis right lung base. One year ago the patient underwent right VATS decortication of a thick peel of the middle lobe and lower lobe which was negative for malignancy but positive for inflammation. A loculated effusion was drained and the patient also had talc pleurodesis. The patient is been followed with serial CT scans. He still has evidence of consolidation at the right lung base but there is only a very minimal loculated effusion now. He has some postthoracotomy pain symptoms. He denies significant shortness of breath.  Patient has chronic cardiac disease, ischemic cardiomyopathy, atrial fibrillation, pacemaker, ejection fraction of 45%. He has chronic venous stasis from venous hypertension of both lower extremities.  Past Medical History:  Diagnosis Date  . Anxiety   . Atrial fibrillation ([place])   . Cardiomyopathy, ischemic 11/07/2013  . Cataract   . CHB (complete heart block) ([place]) 11/06/2013  . Chronic combined systolic and diastolic CHF (congestive heart failure) ([place])   . Colon polyps   . Colon polyps   . Congenital heart block   . Constipation   . CVA (cerebral infarction)   . Depression   . Diverticulosis   . Esophageal stricture   . Family history of adverse reaction to anesthesia   . Hyperlipidemia   . Hypertension   . Hyperthyroidism 11/16/2015  . Lymphoma ([place]) 1998   of colon   . Presence of permanent cardiac pacemaker    St. Jude  pt.states he is totally dependent on pacemaker  . Shortness of breath dyspnea    Pulmonary Effusion  . Stroke [facility])    [id number] TIA  . Umbilical hernia   . Urinary frequency     Past Surgical History:   Procedure Laterality Date  . APPENDECTOMY  1953  . CARDIAC CATHETERIZATION  01/06/2003   Recommend medical therapy  . CARDIOVASCULAR STRESS TEST  07/16/2012   Mild-moderate perfusion defect seen in Basal inferior, Mid inferior, and Apica lateral consistent with infarct/scar. No scintigraphic evidence for inducible myocardial ischemia. No ECG changes. EKG negative for ischemia.  Marland Kitchen CAROTID DOPPLER  11/04/2012   Proximal Rt ICA 50-99% diameter reduction; Lft Bulb demonstrated mild amount homogeneous plaque-not hemodynamically significant; Lft ICA-normal patency.  . CHEST TUBE INSERTION Right 03/15/2016   Procedure: INSERTION PLEURAL DRAINAGE CATHETER;  Surgeon: Ivin Poot, MD;  Location: [place];  Service: Thoracic;  Laterality: Right;  . COLECTOMY     for lymphoma  . COLONOSCOPY    . neck fusion    . PACEMAKER GENERATOR CHANGE  01/31/2012   St Jude Med Accent DR RF model [id number] serial [id number]  . PACEMAKER PLACEMENT     replaced 3 x  . PERMANENT PACEMAKER GENERATOR CHANGE N/A 01/31/2012   Procedure: PERMANENT PACEMAKER GENERATOR CHANGE;  Surgeon: Sanda Klein, MD;  Location: [place] CATH LAB;   . PLEURADESIS N/A 04/02/2016   Procedure: PLEURADESIS;  Surgeon: Ivin Poot, MD;  Location: [place];  Service: Thoracic;  Laterality: N/A;  . REMOVAL OF PLEURAL DRAINAGE CATHETER Right 07/26/2016   Procedure: REMOVAL OF PLEURAL DRAINAGE CATHETER;  Surgeon: Ivin Poot, MD;  Location: [place];  Service: Thoracic;  Laterality: Right;  . TONSILLECTOMY    .  TRANSTHORACIC ECHOCARDIOGRAM  11/04/2012   EF 32-95%, systolic function moderately reduced, mild regurg of the aortic and mitral valves.  Marland Kitchen VIDEO ASSISTED THORACOSCOPY Right 04/02/2016   Procedure: Right VIDEO ASSISTED THORACOSCOPY with Biopsies and drainage pleural effusion;  Surgeon: Ivin Poot, MD;  Location: [facility] OR;  Service: Thoracic;  Laterality: Right;    Family History  Problem Relation Age of Onset  . Prostate cancer Father   . Heart  disease Mother   . Stroke Mother   . Diabetes Maternal Grandmother   . Colon cancer Neg Hx   . Heart attack Neg Hx     Social History Social History  Substance Use Topics  . Smoking status: Never Smoker  . Smokeless tobacco: Never Used  . Alcohol use No    Current Outpatient Prescriptions  Medication Sig Dispense Refill  . acetaminophen (TYLENOL) 500 MG tablet Take 1,000 mg by mouth every 6 (six) hours as needed for moderate pain.     . furosemide (LASIX) 20 MG tablet TAKE 1-2 TABLETS BY MOUTH DAILY AS DIRECTED 180 tablet 1  . metoprolol succinate (TOPROL-XL) 25 MG 24 hr tablet Take 1 tablet (25 mg total) by mouth 2 (two) times daily. 180 tablet 3  . pravastatin (PRAVACHOL) 80 MG tablet Take 1 tablet (80 mg total) by mouth daily. 90 tablet 2  . Tamsulosin HCl (FLOMAX) 0.4 MG CAPS Take 0.4 mg by mouth at bedtime.     . traZODone (DESYREL) 50 MG tablet TAKE 0.5-1 TABLETS (25-50 MG TOTAL) BY MOUTH AT BEDTIME AS NEEDED FOR SLEEP. 30 tablet 2  . warfarin (COUMADIN) 2.5 MG tablet Take 1 to 1.5 tablet by mouth daily as directed by coumadin clinic 135 tablet 1  . warfarin (COUMADIN) 2.5 MG tablet Take 1 to 1 and 1/2 tablets by mouth daily as directed by coumadin clinic 135 tablet 0   No current facility-administered medications for this visit.     Allergies  Allergen Reactions  . Statins Other (See Comments)    Leg cramps.   Tolerates pravastatin.    . Latex Other (See Comments)    Skin irritation  . Adhesive [Tape] Other (See Comments)    Skin irritation - please use paper tape  . Penicillins Rash    Has patient had a PCN reaction causing immediate rash, facial/tongue/throat swelling, SOB or lightheadedness with hypotension: Yes Has patient had a PCN reaction causing severe rash involving mucus membranes or skin necrosis: No Has patient had a PCN reaction that required hospitalization No Has patient had a PCN reaction occurring within the last 10 years: No If all of the above  answers are "NO", then may proceed with Cephalosporin use.    Review of Systems   No fever Weight stable No syncope or headache No chest pain other than right postthoracotomy neuritic pain No change in bowel habits Chronic swelling of his ankles and tibial tissue No bleeding problems on Coumadin  BP 130/84   Pulse (!) 110   Resp 20   Ht 5\' 9"  (1.753 m)   Wt 206 lb (93.4 kg)   SpO2 98% Comment: RA  BMI 30.42 kg/m  Physical Exam      Exam    General- alert and comfortable   Lungs- diminished breath sounds right base but without rales, wheezes   Cor- atrial fibrillation, no murmur or gallop   Abdomen- soft, non-tender   Extremities - warm, non-tender, hyperpigmented lower legs with woody edema   Neuro- oriented, appropriate, no focal  weakness   Diagnostic Tests: CT scan images personally reviewed and counseled with patient There is been no change in the CT scan over the past 6 months. There is still chronic pleural thickening and atelectasis with small amount of loculated effusion. He had a difficult decortication removing a thick peel from the lung and any further reexpansion is unrealistic to expect.  Impression: Stable pulmonary status and CT scan Since there is been no change we'll extend the interval between scans now to approximately one year  Plan: Return in April 2019 for CT scan and review   Len Childs, MD [facility] [phone]

## 2017-06-04 ENCOUNTER — Encounter: Payer: Self-pay | Admitting: Cardiovascular Disease

## 2017-06-04 ENCOUNTER — Ambulatory Visit (INDEPENDENT_AMBULATORY_CARE_PROVIDER_SITE_OTHER): Payer: PPO | Admitting: Pharmacist

## 2017-06-04 ENCOUNTER — Ambulatory Visit (INDEPENDENT_AMBULATORY_CARE_PROVIDER_SITE_OTHER): Payer: PPO | Admitting: Cardiovascular Disease

## 2017-06-04 VITALS — BP 120/80 | HR 133 | Ht 69.0 in | Wt 207.0 lb

## 2017-06-04 DIAGNOSIS — I5022 Chronic systolic (congestive) heart failure: Secondary | ICD-10-CM

## 2017-06-04 DIAGNOSIS — Z7901 Long term (current) use of anticoagulants: Secondary | ICD-10-CM

## 2017-06-04 DIAGNOSIS — I472 Ventricular tachycardia, unspecified: Secondary | ICD-10-CM

## 2017-06-04 DIAGNOSIS — I482 Chronic atrial fibrillation, unspecified: Secondary | ICD-10-CM

## 2017-06-04 DIAGNOSIS — T462X5A Adverse effect of other antidysrhythmic drugs, initial encounter: Secondary | ICD-10-CM

## 2017-06-04 DIAGNOSIS — I2510 Atherosclerotic heart disease of native coronary artery without angina pectoris: Secondary | ICD-10-CM | POA: Diagnosis not present

## 2017-06-04 DIAGNOSIS — J90 Pleural effusion, not elsewhere classified: Secondary | ICD-10-CM

## 2017-06-04 DIAGNOSIS — E785 Hyperlipidemia, unspecified: Secondary | ICD-10-CM | POA: Diagnosis not present

## 2017-06-04 DIAGNOSIS — I10 Essential (primary) hypertension: Secondary | ICD-10-CM | POA: Diagnosis not present

## 2017-06-04 DIAGNOSIS — I4891 Unspecified atrial fibrillation: Secondary | ICD-10-CM

## 2017-06-04 DIAGNOSIS — Z950 Presence of cardiac pacemaker: Secondary | ICD-10-CM | POA: Diagnosis not present

## 2017-06-04 DIAGNOSIS — I4821 Permanent atrial fibrillation: Secondary | ICD-10-CM

## 2017-06-04 DIAGNOSIS — I701 Atherosclerosis of renal artery: Secondary | ICD-10-CM

## 2017-06-04 DIAGNOSIS — E0580 Other thyrotoxicosis without thyrotoxic crisis or storm: Secondary | ICD-10-CM

## 2017-06-04 DIAGNOSIS — I442 Atrioventricular block, complete: Secondary | ICD-10-CM

## 2017-06-04 DIAGNOSIS — I6523 Occlusion and stenosis of bilateral carotid arteries: Secondary | ICD-10-CM | POA: Diagnosis not present

## 2017-06-04 LAB — LIPID PANEL
Chol/HDL Ratio: 4.9 ratio (ref 0.0–5.0)
Cholesterol, Total: 172 mg/dL (ref 100–199)
HDL: 35 mg/dL — AB (ref >39)
LDL Calculated: 115 mg/dL — ABNORMAL HIGH (ref 0–99)
TRIGLYCERIDES: 109 mg/dL (ref 0–149)
VLDL Cholesterol Cal: 22 mg/dL (ref 5–40)

## 2017-06-04 LAB — POCT INR: INR: 4.9

## 2017-06-04 MED ORDER — METOPROLOL SUCCINATE ER 25 MG PO TB24: 25.0000 mg | ORAL_TABLET | Freq: Every evening | ORAL | 3 refills | Status: DC

## 2017-06-04 MED ORDER — METOPROLOL SUCCINATE ER 50 MG PO TB24: 50.0000 mg | ORAL_TABLET | ORAL | 3 refills | Status: DC

## 2017-06-04 NOTE — Progress Notes (Signed)
Patient ID: Benjamin Mckay, male   DOB: 1946-08-05, 71 y.o.   MRN: [id number] Patient ID: Benjamin Mckay [facility], male   DOB: 31-Aug-1946, 71 y.o.   MRN: [id number]    Cardiology Office Note    Date:  06/04/2017   ID:  Benjamin Mckay, DOB May 30, 1946, MRN [id number]  PCP:  [place], FNP  Cardiologist:   Sanda Klein, MD   Chief complaint: weakness, weight loss   History of Present Illness:  Benjamin Mckay is a 71 y.o. male with complex cardiac history (complete heart block-pacemaker dependent, chronic atrial fibrillation versus atrial standstill, history of sustained ventricular tachycardia and near syncope, coronary artery disease with history of inferior wall scar due to myocardial infarction, renal and carotid artery stenosis), amiodarone induced hyperthyroidism (now quiescent) and a recurrent right pleural effusion (s/p pleurodesis).  He has not had problems with edema or dyspnea at rest or with activity. He has been very active this summer. His weight has increased about 10 pounds since his last appointment.  He has now been off amiodarone for about a year and a half. He is off anti-thyroid medications. He had a pleurodesis in Jun 2017e. He last saw Dr. Prescott Gum in July, 2018.   The patient specifically denies any chest pain at rest or exertion, dyspnea at rest, orthopnea, paroxysmal nocturnal dyspnea, syncope, palpitations, focal neurological deficits, intermittent claudication, lower extremity edema, cough, hemoptysis or wheezing. He has not had any bleeding problems. He had a tooth extraction earlier this week. Bleeding has stopped.  He presents today with atrial fibrillation rapid ventricular response at about 150 bpm. He reports that this started this morning and that coincides with his device findings. Rapid ventricular rates are seen roughly 5% of the time judging by his histograms. The available tracings also just atrial fibrillation, not ventricular tachycardia.  His  electrocardiogram shows atrial fibrillation with rapid ventricular response with broad QRS complex, left bundle branch block morphology with left axis deviation, QRS 156 ms. QTc interval 476 ms (native beat).  Past Medical History:  Diagnosis Date  . Anxiety   . Atrial fibrillation ([place])   . Cardiomyopathy, ischemic 11/07/2013  . Cataract   . CHB (complete heart block) ([place]) 11/06/2013  . Chronic combined systolic and diastolic CHF (congestive heart failure) ([place])   . Colon polyps   . Colon polyps   . Congenital heart block   . Constipation   . CVA (cerebral infarction)   . Depression   . Diverticulosis   . Esophageal stricture   . Family history of adverse reaction to anesthesia   . Hyperlipidemia   . Hypertension   . Hyperthyroidism 11/16/2015  . Lymphoma ([place]) 1998   of colon   . Presence of permanent cardiac pacemaker    St. Jude  pt.states he is totally dependent on pacemaker  . Shortness of breath dyspnea    Pulmonary Effusion  . Stroke [facility])    [id number] TIA  . Umbilical hernia   . Urinary frequency     Past Surgical History:  Procedure Laterality Date  . APPENDECTOMY  1953  . CARDIAC CATHETERIZATION  01/06/2003   Recommend medical therapy  . CARDIOVASCULAR STRESS TEST  07/16/2012   Mild-moderate perfusion defect seen in Basal inferior, Mid inferior, and Apica lateral consistent with infarct/scar. No scintigraphic evidence for inducible myocardial ischemia. No ECG changes. EKG negative for ischemia.  Marland Kitchen CAROTID DOPPLER  11/04/2012   Proximal Rt ICA 50-99% diameter reduction; Lft Bulb demonstrated mild amount  homogeneous plaque-not hemodynamically significant; Lft ICA-normal patency.  . CHEST TUBE INSERTION Right 03/15/2016   Procedure: INSERTION PLEURAL DRAINAGE CATHETER;  Surgeon: Ivin Poot, MD;  Location: [place];  Service: Thoracic;  Laterality: Right;  . COLECTOMY     for lymphoma  . COLONOSCOPY    . neck fusion    . PACEMAKER GENERATOR CHANGE  01/31/2012   St  Jude Med Accent DR RF model [id number] serial [id number]  . PACEMAKER PLACEMENT     replaced 3 x  . PERMANENT PACEMAKER GENERATOR CHANGE N/A 01/31/2012   Procedure: PERMANENT PACEMAKER GENERATOR CHANGE;  Surgeon: Sanda Klein, MD;  Location: [place] CATH LAB;   . PLEURADESIS N/A 04/02/2016   Procedure: PLEURADESIS;  Surgeon: Ivin Poot, MD;  Location: [place];  Service: Thoracic;  Laterality: N/A;  . REMOVAL OF PLEURAL DRAINAGE CATHETER Right 07/26/2016   Procedure: REMOVAL OF PLEURAL DRAINAGE CATHETER;  Surgeon: Ivin Poot, MD;  Location: [place];  Service: Thoracic;  Laterality: Right;  . TONSILLECTOMY    . TRANSTHORACIC ECHOCARDIOGRAM  11/04/2012   EF 19-14%, systolic function moderately reduced, mild regurg of the aortic and mitral valves.  Marland Kitchen VIDEO ASSISTED THORACOSCOPY Right 04/02/2016   Procedure: Right VIDEO ASSISTED THORACOSCOPY with Biopsies and drainage pleural effusion;  Surgeon: Ivin Poot, MD;  Location: [facility];  Service: Thoracic;  Laterality: Right;    Current Medications: Outpatient Medications Prior to Visit  Medication Sig Dispense Refill  . acetaminophen (TYLENOL) 500 MG tablet Take 1,000 mg by mouth every 6 (six) hours as needed for moderate pain.     . furosemide (LASIX) 20 MG tablet TAKE 1-2 TABLETS BY MOUTH DAILY AS DIRECTED 180 tablet 1  . pravastatin (PRAVACHOL) 80 MG tablet Take 1 tablet (80 mg total) by mouth daily. 90 tablet 2  . Tamsulosin HCl (FLOMAX) 0.4 MG CAPS Take 0.4 mg by mouth at bedtime.     . traZODone (DESYREL) 50 MG tablet TAKE 0.5-1 TABLETS (25-50 MG TOTAL) BY MOUTH AT BEDTIME AS NEEDED FOR SLEEP. 30 tablet 2  . warfarin (COUMADIN) 2.5 MG tablet Take 1 to 1 and 1/2 tablets by mouth daily as directed by coumadin clinic 135 tablet 0  . metoprolol succinate (TOPROL-XL) 25 MG 24 hr tablet Take 1 tablet (25 mg total) by mouth 2 (two) times daily. 180 tablet 3  . warfarin (COUMADIN) 2.5 MG tablet Take 1 to 1.5 tablet by mouth daily as directed by coumadin  clinic 135 tablet 1   No facility-administered medications prior to visit.      Allergies:   Statins; Adhesive [tape]; Latex; and Penicillins   Social History   Social History  . Marital status: Married    Spouse name: Chartered certified accountant  . Number of children: 3  . Years of education: 22   Occupational History  . Retired    Social History Main Topics  . Smoking status: Never Smoker  . Smokeless tobacco: Never Used  . Alcohol use No  . Drug use: No  . Sexual activity: Not Asked   Other Topics Concern  . None   Social History Narrative   Fun: Golf when he can   Consumes caffeine 2 cups per day       Family History:  The patient's family history includes Diabetes in his maternal grandmother; Heart disease in his mother; Prostate cancer in his father; Stroke in his mother.   ROS:   Please see the history of present illness.    ROS  All other systems reviewed and are negative.   PHYSICAL EXAM:   VS:  BP 120/80   Pulse (!) 133   Ht 5\' 9"  (1.753 m)   Wt 207 lb (93.9 kg)   BMI 30.57 kg/m    GEN: Well nourished, well developed, in no acute distress. He looks fit and comfortable.   General: Alert, oriented x3, no distress Head: no evidence of trauma, PERRL, EOMI, no exophtalmos or lid lag, no myxedema, no xanthelasma; normal ears, nose and oropharynx Neck: normal jugular venous pulsations and no hepatojugular reflux; brisk carotid pulses without delay and no carotid bruits Chest: Minute breath sounds right base, no signs of consolidation by percussion or palpation, normal fremitus, symmetrical and full respiratory excursions Cardiovascular: normal position and quality of the apical impulse, rapid irregular rhythm, normal first and Paradoxically split second heart sounds, no murmurs, rubs or gallops Abdomen: no tenderness or distention, no masses by palpation, no abnormal pulsatility or arterial bruits, normal bowel sounds, no hepatosplenomegaly Extremities: no clubbing,  cyanosis or edema; 2+ radial, ulnar and brachial pulses bilaterally; 2+ right femoral, posterior tibial and dorsalis pedis pulses; 2+ left femoral, posterior tibial and dorsalis pedis pulses; no subclavian or femoral bruits Neurological: grossly nonfocal Psych: euthymic mood, full affect  Wt Readings from Last 3 Encounters:  06/04/17 207 lb (93.9 kg)  05/08/17 206 lb (93.4 kg)  02/03/17 207 lb 8 oz (94.1 kg)      Studies/Labs Reviewed:   EKG:  EKG is ordered today. There is no atrial activity. There is Accelerated idioventricular rhythm (versus junctional rhythm with left bundle branch block). QTC 428 ms. Recent Labs: 09/25/2016: ALT 8; Hemoglobin 15.9; Platelets 231.0   Lipid Panel    Component Value Date/Time   CHOL 182 07/30/2016 0900   TRIG 63 07/30/2016 0900   HDL 40 07/30/2016 0900   CHOLHDL 4.6 07/30/2016 0900   VLDL 13 07/30/2016 0900   LDLCALC 129 07/30/2016 0900     ASSESSMENT:    1. Chronic systolic CHF (congestive heart failure) ([place])   2. CHB (complete heart block) ([facility])   3. Chronic atrial fibrillation ([place])   4. Coronary artery disease involving native coronary artery of native heart without angina pectoris   5. Essential hypertension   6. Single chamber St. Jude pacemaker 2013   7. VT (ventricular tachycardia) ([place])   8. Amiodarone-induced hyperthyroidism   9. Warfarin anticoagulation   10. Renal artery stenosis ([place])   11. Bilateral carotid artery stenosis   12. Loculated pleural effusion   13. Dyslipidemia      PLAN:  In order of problems listed above:  1. CHF: Appears to be at euvolemic state. We'll continue current dose of diuretic. NYHA functional class I-II . Most recent echo in February 2017 shows ejection fraction of 45-50%. He did have moderate pulmonary hypertension with estimated PA pressure 54 mmHg. Continue daily weight monitoring and sodium restriction, adjusted doses of furosemide 2. CHB: Previously felt to be Pacemaker dependent, but  now with periods of atrial fibrillation as well as accelerated junctional or idioventricular rhythm and only 40% ventricular pacing. Upgrade to CRT device has been discussed but felt to be high-risk due to his multiple previous surgical procedures and increased risk of infection 3. AFib: CHADSVasc 4 (age, CHF, CAD, HTN). Warfarin anticoagulation without bleeding complications and without history of stroke/TIA. Today he is in atrial fibrillation with rapid ventricular rate. Will increase metoprolol to 50 mg every morning and 25 mg in the evening. Reassess adequacy  of ventricular rate control by remote download in 3 months. This advantage of more aggressive rate control we increased burden of ventricular pacing. 4. CAD: Asymptomatic. Scar of previous inferior wall myocardial infarction, currently free of angina pectoris. CT chest documents atherosclerosis in the distribution of all 3 coronary arteries. 5. HTN: Well controlled. There is room to increase his beta blocker dose 6. PPM: Normal device function.  Remote download in 3 months and office visit in 6 months 7. VT: None has been recorded since he was started on treatment with amiodarone in 2014 for an episode of near syncope and sustained monomorphic VT with a fairly slow cycle length of 370 ms (inferior scar VT?). Amiodarone has now been discontinued for about 18 months without any recurrence of symptomatic ventricular arrhythmia. However, his pacemaker has recorded numerous episodes of high ventricular rates. At least today, this is due to atrial fibrillation with rapid ventricular response. As long as he does not have symptomatic VT I don't think there are any compelling reasons to treat him, considering the side effects he had in the past. 8. Amiodarone-induced  hyperthyroidism: Currently euthyroid, off amiodarone and off anti-thyroid medications  9. Anticoagulation: on warfarin without bleeding problems 10. RAS:  Normal renal function and blood  pressure  11. Carotid stenosis: Rechecked 12/2015, mild progression. Recommend we reevaluate by [place], but patient declined. 12. R pleural effusion: Exudative effusion of unclear etiology, s/p pleurodesis, resolving. Saw Dr. Prescott Gum last month. CT showed no change with chronic pleural thickening and atelectasis with a small amount of loculated effusion. "Further reexpansion is unrealistic to expect". 13. HLP: Recheck lipids in October showed LDL above target despite maximum dose pravastatin. He has not tolerated other statins. Recheck lipids today.    Medication Adjustments/Labs and Tests Ordered: Current medicines are reviewed at length with the patient today.  Concerns regarding medicines are outlined above.  Medication changes, Labs and Tests ordered today are listed in the Patient Instructions below. Patient Instructions  Dr Sallyanne Kuster has recommended making the following medication changes: 1. INCREASE Metoprolol - take 50 mg in the morning and continue 25 mg in the evening  Your physician recommends that you return for lab work at your earliest [place].  Remote monitoring is used to monitor your Pacemaker of ICD from home. This monitoring reduces the number of office visits required to check your device to one time per year. It allows [place] to keep an eye on the functioning of your device to ensure it is working properly. You are scheduled for a device check from home on Wednesday, November 21st, 2018. You may send your transmission at any time that day. If you have a wireless device, the transmission will be sent automatically. After your physician reviews your transmission, you will receive a postcard with your next transmission date.  Dr Sallyanne Kuster recommends that you schedule a follow-up appointment in 6 months with a pacemaker check. You will receive a reminder letter in the mail two months in advance. If you don't receive a letter, please call our office to schedule the follow-up  appointment.  If you need a refill on your cardiac medications before your next appointment, please call your pharmacy.    Signed, Sanda Klein, MD  06/04/2017 10:18 AM    [facility] [address], [place], [place]  [address] Phone: [phone]; Fax: [phone]

## 2017-06-04 NOTE — Patient Instructions (Addendum)
Dr Sallyanne Kuster has recommended making the following medication changes: 1. INCREASE Metoprolol - take 50 mg in the morning and continue 25 mg in the evening  Your physician recommends that you return for lab work at your earliest [place].  Remote monitoring is used to monitor your Pacemaker of ICD from home. This monitoring reduces the number of office visits required to check your device to one time per year. It allows [place] to keep an eye on the functioning of your device to ensure it is working properly. You are scheduled for a device check from home on Wednesday, November 21st, 2018. You may send your transmission at any time that day. If you have a wireless device, the transmission will be sent automatically. After your physician reviews your transmission, you will receive a postcard with your next transmission date.  Dr Sallyanne Kuster recommends that you schedule a follow-up appointment in 6 months with a pacemaker check. You will receive a reminder letter in the mail two months in advance. If you don't receive a letter, please call our office to schedule the follow-up appointment.  If you need a refill on your cardiac medications before your next appointment, please call your pharmacy.

## 2017-06-12 ENCOUNTER — Telehealth: Payer: Self-pay | Admitting: *Deleted

## 2017-06-12 DIAGNOSIS — E7800 Pure hypercholesterolemia, unspecified: Secondary | ICD-10-CM

## 2017-06-12 MED ORDER — EZETIMIBE 10 MG PO TABS: 10.0000 mg | ORAL_TABLET | Freq: Every day | ORAL | 6 refills | Status: DC

## 2017-06-12 NOTE — Telephone Encounter (Addendum)
-----   Message from Sanda Klein, MD sent at 06/04/2017  7:00 PM EDT ----- LDL cholesterol at 115 is best it has been in last 2 years, but far from our target of under 70. Would he be willing to try adding Zetia for 3 months and see if that gets [place] there? If so please Rx and recheck lipids in 3 months. Another option is Repatha or Praluent, but much more expensive.   Spoke with pt, awa re of results. New script sent to the pharmacy and Lab orders mailed to the pt.

## 2017-06-15 ENCOUNTER — Other Ambulatory Visit: Payer: Self-pay | Admitting: Family

## 2017-06-26 ENCOUNTER — Ambulatory Visit (INDEPENDENT_AMBULATORY_CARE_PROVIDER_SITE_OTHER): Payer: PPO | Admitting: Pharmacist

## 2017-06-26 DIAGNOSIS — I4891 Unspecified atrial fibrillation: Secondary | ICD-10-CM

## 2017-06-26 DIAGNOSIS — I4821 Permanent atrial fibrillation: Secondary | ICD-10-CM

## 2017-06-26 DIAGNOSIS — I482 Chronic atrial fibrillation, unspecified: Secondary | ICD-10-CM

## 2017-06-26 DIAGNOSIS — Z7901 Long term (current) use of anticoagulants: Secondary | ICD-10-CM

## 2017-06-26 LAB — POCT INR: INR: 2.2

## 2017-07-21 ENCOUNTER — Ambulatory Visit (INDEPENDENT_AMBULATORY_CARE_PROVIDER_SITE_OTHER): Payer: PPO | Admitting: Family

## 2017-07-21 ENCOUNTER — Encounter: Payer: Self-pay | Admitting: Family

## 2017-07-21 ENCOUNTER — Other Ambulatory Visit (INDEPENDENT_AMBULATORY_CARE_PROVIDER_SITE_OTHER): Payer: PPO

## 2017-07-21 VITALS — BP 136/84 | HR 66 | Temp 98.3°F | Resp 16 | Ht 69.0 in | Wt 215.0 lb

## 2017-07-21 DIAGNOSIS — I482 Chronic atrial fibrillation, unspecified: Secondary | ICD-10-CM

## 2017-07-21 DIAGNOSIS — E782 Mixed hyperlipidemia: Secondary | ICD-10-CM | POA: Diagnosis not present

## 2017-07-21 DIAGNOSIS — Z0000 Encounter for general adult medical examination without abnormal findings: Secondary | ICD-10-CM

## 2017-07-21 DIAGNOSIS — Z23 Encounter for immunization: Secondary | ICD-10-CM

## 2017-07-21 DIAGNOSIS — I472 Ventricular tachycardia, unspecified: Secondary | ICD-10-CM

## 2017-07-21 DIAGNOSIS — I5022 Chronic systolic (congestive) heart failure: Secondary | ICD-10-CM | POA: Diagnosis not present

## 2017-07-21 DIAGNOSIS — I10 Essential (primary) hypertension: Secondary | ICD-10-CM

## 2017-07-21 LAB — COMPREHENSIVE METABOLIC PANEL
ALBUMIN: 3.9 g/dL (ref 3.5–5.2)
ALT: 9 U/L (ref 0–53)
AST: 11 U/L (ref 0–37)
Alkaline Phosphatase: 99 U/L (ref 39–117)
BUN: 13 mg/dL (ref 6–23)
CHLORIDE: 103 meq/L (ref 96–112)
CO2: 27 mEq/L (ref 19–32)
Calcium: 9.4 mg/dL (ref 8.4–10.5)
Creatinine, Ser: 0.9 mg/dL (ref 0.40–1.50)
GFR: 88.27 mL/min (ref >60.00)
GLUCOSE: 104 mg/dL — AB (ref 70–99)
POTASSIUM: 3.9 meq/L (ref 3.5–5.1)
SODIUM: 138 meq/L (ref 135–145)
Total Bilirubin: 1.5 mg/dL — ABNORMAL HIGH (ref 0.2–1.2)
Total Protein: 7.5 g/dL (ref 6.0–8.3)

## 2017-07-21 LAB — CBC
HEMATOCRIT: 45.9 % (ref 39.0–52.0)
Hemoglobin: 15.5 g/dL (ref 13.0–17.0)
MCHC: 33.9 g/dL (ref 30.0–36.0)
MCV: 96.5 fl (ref 78.0–100.0)
Platelets: 177 10*3/uL (ref 150.0–400.0)
RBC: 4.75 Mil/uL (ref 4.22–5.81)
RDW: 14.1 % (ref 11.5–15.5)
WBC: 7.9 10*3/uL (ref 4.0–10.5)

## 2017-07-21 MED ORDER — TRAZODONE HCL 50 MG PO TABS: 25.0000 mg | ORAL_TABLET | Freq: Every evening | ORAL | 2 refills | Status: DC | PRN

## 2017-07-21 NOTE — Assessment & Plan Note (Signed)
Reviewed and updated patient's medical, surgical, family and social history. Medications and allergies were also reviewed. Basic screenings for depression, activities of daily living, hearing, cognition and safety were performed. Provider list was updated and health plan was provided to the patient.  

## 2017-07-21 NOTE — Assessment & Plan Note (Addendum)
1) Anticipatory Guidance: Discussed importance of wearing a seatbelt while driving and not texting while driving; changing batteries in smoke detector at least once annually; wearing suntan lotion when outside; eating a balanced and moderate diet; getting physical activity at least 30 minutes per day.  2) Immunizations / Screenings / Labs:  Influenza updated today. Declines tetanus. All other immunizations are up-to-date per recommendations. Colon cancer screening is up-to-date. PSA monitored by urology. Declines hepatitis C screening. All other screenings are up-to-date per recommendations. Obtain CBC, and complete metabolic profile.  Overall well exam with risk factors for cardiovascular disease including hypertension, heart failure, hyperlipidemia and atrial fibrillation. Chronic conditions appear adequately controlled current medication regimen and no adverse side effects. Overall he reports feeling very good. Encouraged increased physical activity to 2-3 days of moderate level activity or about 7-10,000 steps per day. Continue other healthy lifestyle behaviors and choices. Follow-up prevention exam in 1 year. Follow-up office visit for chronic conditions and pending blood work as needed.

## 2017-07-21 NOTE — Patient Instructions (Addendum)
Thank you for choosing [facility].  SUMMARY AND INSTRUCTIONS:  Please continue to take your medications as prescribed.   We will check your kidney function, liver function, white/red blood cells, and electrolytes.  Continue to follow up with the specialists as scheduled.   Medication:  Your prescription(s) have been submitted to your pharmacy or been printed and provided for you. Please take as directed and contact our office if you believe you are having problem(s) with the medication(s) or have any questions.  Labs:  Please stop by the lab on the lower level of the building for your blood work. Your results will be released to [facility] (or called to you) after review, usually within 72 hours after test completion. If any changes need to be made, you will be notified at that same time.  1.) The lab is open from 7:30am to 5:30 pm Monday-Friday 2.) No appointment is necessary 3.) Fasting (if needed) is 6-8 hours after food and drink; black coffee and water are okay    Follow up:  If your symptoms worsen or fail to improve, please contact our office for further instruction, or in case of emergency go directly to the emergency room at the closest medical facility.   Health Maintenance  Topic Date Due  . Hepatitis C Screening  1946-06-24  . INFLUENZA VACCINE  05/14/2017  . TETANUS/TDAP  09/10/2017 (Originally 12/21/1964)  . COLONOSCOPY  05/14/2021  . PNA vac Low Risk Adult  Completed    Health Maintenance, Male A healthy lifestyle and preventive care is important for your health and wellness. Ask your health care provider about what schedule of regular examinations is right for you. What should I know about weight and diet? Eat a Healthy Diet  Eat plenty of vegetables, fruits, whole grains, low-fat dairy products, and lean protein.  Do not eat a lot of foods high in solid fats, added sugars, or salt.  Maintain a Healthy Weight Regular exercise can help you achieve or  maintain a healthy weight. You should:  Do at least 150 minutes of exercise each week. The exercise should increase your heart rate and make you sweat (moderate-intensity exercise).  Do strength-training exercises at least twice a week.  Watch Your Levels of Cholesterol and Blood Lipids  Have your blood tested for lipids and cholesterol every 5 years starting at 71 years of age. If you are at high risk for heart disease, you should start having your blood tested when you are 71 years old. You may need to have your cholesterol levels checked more often if: ? Your lipid or cholesterol levels are high. ? You are older than 71 years of age. ? You are at high risk for heart disease.  What should I know about cancer screening? Many types of cancers can be detected early and may often be prevented. Lung Cancer  You should be screened every year for lung cancer if: ? You are a current smoker who has smoked for at least 30 years. ? You are a former smoker who has quit within the past 15 years.  Talk to your health care provider about your screening options, when you should start screening, and how often you should be screened.  Colorectal Cancer  Routine colorectal cancer screening usually begins at 71 years of age and should be repeated every 5-10 years until you are 71 years old. You may need to be screened more often if early forms of precancerous polyps or small growths are found. Your health  care provider may recommend screening at an earlier age if you have risk factors for colon cancer.  Your health care provider may recommend using home test kits to check for hidden blood in the stool.  A small camera at the end of a tube can be used to examine your colon (sigmoidoscopy or colonoscopy). This checks for the earliest forms of colorectal cancer.  Prostate and Testicular Cancer  Depending on your age and overall health, your health care provider may do certain tests to screen for prostate  and testicular cancer.  Talk to your health care provider about any symptoms or concerns you have about testicular or prostate cancer.  Skin Cancer  Check your skin from head to toe regularly.  Tell your health care provider about any new moles or changes in moles, especially if: ? There is a change in a mole's size, shape, or color. ? You have a mole that is larger than a pencil eraser.  Always use sunscreen. Apply sunscreen liberally and repeat throughout the day.  Protect yourself by wearing long sleeves, pants, a wide-brimmed hat, and sunglasses when outside.  What should I know about heart disease, diabetes, and high blood pressure?  If you are 76-30 years of age, have your blood pressure checked every 3-5 years. If you are 32 years of age or older, have your blood pressure checked every year. You should have your blood pressure measured twice-once when you are at a hospital or clinic, and once when you are not at a hospital or clinic. Record the average of the two measurements. To check your blood pressure when you are not at a hospital or clinic, you can use: ? An automated blood pressure machine at a pharmacy. ? A home blood pressure monitor.  Talk to your health care provider about your target blood pressure.  If you are between 22-76 years old, ask your health care provider if you should take aspirin to prevent heart disease.  Have regular diabetes screenings by checking your fasting blood sugar level. ? If you are at a normal weight and have a low risk for diabetes, have this test once every three years after the age of 27. ? If you are overweight and have a high risk for diabetes, consider being tested at a younger age or more often.  A one-time screening for abdominal aortic aneurysm (AAA) by ultrasound is recommended for men aged 12-75 years who are current or former smokers. What should I know about preventing infection? Hepatitis B If you have a higher risk for  hepatitis B, you should be screened for this virus. Talk with your health care provider to find out if you are at risk for hepatitis B infection. Hepatitis C Blood testing is recommended for:  Everyone born from [birth year] through 1965.  Anyone with known risk factors for hepatitis C.  Sexually Transmitted Diseases (STDs)  You should be screened each year for STDs including gonorrhea and chlamydia if: ? You are sexually active and are younger than 71 years of age. ? You are older than 71 years of age and your health care provider tells you that you are at risk for this type of infection. ? Your sexual activity has changed since you were last screened and you are at an increased risk for chlamydia or gonorrhea. Ask your health care provider if you are at risk.  Talk with your health care provider about whether you are at high risk of being infected with HIV. Your health  care provider may recommend a prescription medicine to help prevent HIV infection.  What else can I do?  Schedule regular health, dental, and eye exams.  Stay current with your vaccines (immunizations).  Do not use any tobacco products, such as cigarettes, chewing tobacco, and e-cigarettes. If you need help quitting, ask your health care provider.  Limit alcohol intake to no more than 2 drinks per day. One drink equals 12 ounces of beer, 5 ounces of wine, or 1 ounces of hard liquor.  Do not use street drugs.  Do not share needles.  Ask your health care provider for help if you need support or information about quitting drugs.  Tell your health care provider if you often feel depressed.  Tell your health care provider if you have ever been abused or do not feel safe at home. This information is not intended to replace advice given to you by your health care provider. Make sure you discuss any questions you have with your health care provider. Document Released: 03/28/2008 Document Revised: 05/29/2016 Document Reviewed:  07/04/2015 Elsevier Interactive Patient Education  2018 [place] Directive Advance directives are legal documents that let you make choices ahead of time about your health care and medical treatment in case you become unable to communicate for yourself. Advance directives are a way for you to communicate your wishes to family, friends, and health care providers. This can help convey your decisions about end-of-life care if you become unable to communicate. Discussing and writing advance directives should happen over time rather than all at once. Advance directives can be changed depending on your situation and what you want, even after you have signed the advance directives. If you do not have an advance directive, some states assign family decision makers to act on your behalf based on how closely you are related to them. Each state has its own laws regarding advance directives. You may want to check with your health care provider, attorney, or state representative about the laws in your state. There are different types of advance directives, such as:  Medical power of attorney.  Living will.  Do not resuscitate (DNR) or do not attempt resuscitation (DNAR) order.  Health care proxy and medical power of attorney A health care proxy, also called a health care agent, is a person who is appointed to make medical decisions for you in cases in which you are unable to make the decisions yourself. Generally, people choose someone they know well and trust to represent their preferences. Make sure to ask this person for an agreement to act as your proxy. A proxy may have to exercise judgment in the event of a medical decision for which your wishes are not known. A medical power of attorney is a legal document that names your health care proxy. Depending on the laws in your state, after the document is written, it may also need to  be:  Signed.  Notarized.  Dated.  Copied.  Witnessed.  Incorporated into your medical record.  You may also want to appoint someone to manage your financial affairs in a situation in which you are unable to do so. This is called a durable power of attorney for finances. It is a separate legal document from the durable power of attorney for health care. You may choose the same person or someone different from your health care proxy to act as your agent in financial matters. If you do not appoint a proxy, or if there  is a concern that the proxy is not acting in your best interests, a court-appointed guardian may be designated to act on your behalf. Living will A living will is a set of instructions documenting your wishes about medical care when you cannot express them yourself. Health care providers should keep a copy of your living will in your medical record. You may want to give a copy to family members or friends. To alert caregivers in case of an emergency, you can place a card in your wallet to let them know that you have a living will and where they can find it. A living will is used if you become:  Terminally ill.  Incapacitated.  Unable to communicate or make decisions.  Items to consider in your living will include:  The use or non-use of life-sustaining equipment, such as dialysis machines and breathing machines (ventilators).  A DNR or DNAR order, which is the instruction not to use cardiopulmonary resuscitation (CPR) if breathing or heartbeat stops.  The use or non-use of tube feeding.  Withholding of food and fluids.  Comfort (palliative) care when the goal becomes comfort rather than a cure.  Organ and tissue donation.  A living will does not give instructions for distributing your money and property if you should pass away. It is recommended that you seek the advice of a lawyer when writing a will. Decisions about taxes, beneficiaries, and asset distribution will  be legally binding. This process can relieve your family and friends of any concerns surrounding disputes or questions that may come up about the distribution of your assets. DNR or DNAR A DNR or DNAR order is a request not to have CPR in the event that your heart stops beating or you stop breathing. If a DNR or DNAR order has not been made and shared, a health care provider will try to help any patient whose heart has stopped or who has stopped breathing. If you plan to have surgery, talk with your health care provider about how your DNR or DNAR order will be followed if problems occur. Summary  Advance directives are the legal documents that allow you to make choices ahead of time about your health care and medical treatment in case you become unable to communicate for yourself.  The process of discussing and writing advance directives should happen over time. You can change the advance directives, even after you have signed them.  Advance directives include DNR or DNAR orders, living wills, and designating an agent as your medical power of attorney. This information is not intended to replace advice given to you by your health care provider. Make sure you discuss any questions you have with your health care provider. Document Released: 01/07/2008 Document Revised: 08/19/2016 Document Reviewed: 08/19/2016 Elsevier Interactive Patient Education  2017 [employer].

## 2017-07-21 NOTE — Progress Notes (Signed)
Subjective:    Patient ID: Benjamin Mckay, male    DOB: [birth year], 71 y.o.   MRN: [id number]  Chief Complaint  Patient presents with  . CPE    fasting    HPI:  Benjamin Mckay is a 71 y.o. male who presents today for a Medicare Annual Wellness/Physical exam.    1) Health Maintenance -   Diet - Averages about 1-2 meals per day with snacks of consisting of regular diet and working on a low sodium aspect; Caffeine intake of 4-5 cups per day.   Exercise - No structured exercise   2) Preventative Exams / Immunizations:  Dental -- Up to date  Vision -- Up to date    Health Maintenance  Topic Date Due  . INFLUENZA VACCINE  05/14/2017  . TETANUS/TDAP  09/10/2017 (Originally 12/21/1964)  . Hepatitis C Screening  07/14/2018 (Originally 06-06-1946)  . COLONOSCOPY  05/14/2021  . PNA vac Low Risk Adult  Completed     Immunization History  Administered Date(s) Administered  . Influenza, High Dose Seasonal PF 07/21/2017  . Influenza,inj,Quad PF,6+ Mos 07/15/2015  . Pneumococcal Conjugate-13 09/10/2016  . Pneumococcal Polysaccharide-23 02/01/2012    RISK FACTORS  Tobacco History  Smoking Status  . Never Smoker  Smokeless Tobacco  . Never Used     Cardiac risk factors: advanced age (older than 97 for men, 21 for women), dyslipidemia, hypertension, male gender and obesity (BMI >= 30 kg/m2).  Depression Screen  Depression screen PHQ 2/9 07/21/2017  Decreased Interest 0  Down, Depressed, Hopeless 0  PHQ - 2 Score 0  Some recent data might be hidden     Activities of Daily Living In your present state of health, do you have any difficulty performing the following activities?:  Driving? No Managing money?  No Feeding yourself? No Getting from bed to chair? No Climbing a flight of stairs? No Preparing food and eating?: No Bathing or showering? No Getting dressed: No Getting to the toilet? No Using the toilet: No Moving around from place to place: No In the  past year have you fallen or had a near fall?:No   Home Safety Has smoke detector and wears seat belts. Occasional excess sun exposure. Are there smokers in your home (other than you)?  No Do you feel safe at home?  Yes  Hearing Difficulties: No Do you often ask people to speak up or repeat themselves? No Do you experience ringing or noises in your ears? No  Do you have difficulty understanding soft or whispered voices? No    Cognitive Testing  Alert? Yes   Normal Appearance? Yes  Oriented to person? Yes  Place? Yes   Time? Yes  Recall of three objects?  Yes  Can perform simple calculations? Yes  Displays appropriate judgment? Yes  Can read the correct time from a watch face? Yes  Do you feel that you have a problem with memory? No  Do you often misplace items? No   Advanced Directives have been discussed with the patient? Yes   Current Physicians/Providers and Suppliers  1. Terri Piedra, FNP - Internal Medicine 2. Sanda Klein, MD - Cardiology 3. Cristopher Peru, MD - Cardiology 4. Ivin Poot, MD - Cardiothoracic Surgery   Indicate any recent Medical Services you may have received from other than [facility] providers in the past year (date may be approximate).  All answers were reviewed with the patient and necessary referrals were made:  Mauricio Po, [place]   07/21/2017  Allergies  Allergen Reactions  . Statins Other (See Comments)    Leg cramps.   Tolerates pravastatin.    . Adhesive [Tape] Other (See Comments)    Skin irritation - please use paper tape  . Latex Other (See Comments)    Skin irritation  . Penicillins Rash    Has patient had a PCN reaction causing immediate rash, facial/tongue/throat swelling, SOB or lightheadedness with hypotension: Yes Has patient had a PCN reaction causing severe rash involving mucus membranes or skin necrosis: No Has patient had a PCN reaction that required hospitalization No Has patient had a PCN reaction occurring within the  last 10 years: No If all of the above answers are "NO", then may proceed with Cephalosporin use.     Outpatient Medications Prior to Visit  Medication Sig Dispense Refill  . acetaminophen (TYLENOL) 500 MG tablet Take 1,000 mg by mouth every 6 (six) hours as needed for moderate pain.     . furosemide (LASIX) 20 MG tablet TAKE 1-2 TABLETS BY MOUTH DAILY AS DIRECTED 180 tablet 1  . metoprolol succinate (TOPROL-XL) 25 MG 24 hr tablet Take 1 tablet (25 mg total) by mouth every evening. 90 tablet 3  . metoprolol succinate (TOPROL-XL) 50 MG 24 hr tablet Take 1 tablet (50 mg total) by mouth every morning. Take with or immediately following a meal. 90 tablet 3  . pravastatin (PRAVACHOL) 80 MG tablet Take 1 tablet (80 mg total) by mouth daily. 90 tablet 2  . Tamsulosin HCl (FLOMAX) 0.4 MG CAPS Take 0.4 mg by mouth at bedtime.     Marland Kitchen warfarin (COUMADIN) 2.5 MG tablet Take 1 to 1 and 1/2 tablets by mouth daily as directed by coumadin clinic 135 tablet 0  . traZODone (DESYREL) 50 MG tablet TAKE 0.5-1 TABLETS (25-50 MG TOTAL) BY MOUTH AT BEDTIME AS NEEDED FOR SLEEP. 30 tablet 2  . ezetimibe (ZETIA) 10 MG tablet Take 1 tablet (10 mg total) by mouth daily. 30 tablet 6   No facility-administered medications prior to visit.      Past Medical History:  Diagnosis Date  . Anxiety   . Atrial fibrillation ([place])   . Cardiomyopathy, ischemic 11/07/2013  . Cataract   . CHB (complete heart block) ([place]) 11/06/2013  . Chronic combined systolic and diastolic CHF (congestive heart failure) ([place])   . Colon polyps   . Colon polyps   . Congenital heart block   . Constipation   . CVA (cerebral infarction)   . Depression   . Diverticulosis   . Esophageal stricture   . Family history of adverse reaction to anesthesia   . Hyperlipidemia   . Hypertension   . Hyperthyroidism 11/16/2015  . Lymphoma ([place]) 1998   of colon   . Presence of permanent cardiac pacemaker    [employer]. Jude  pt.states he is totally dependent on  pacemaker  . Shortness of breath dyspnea    Pulmonary Effusion  . Stroke [facility])    [id number] TIA  . Umbilical hernia   . Urinary frequency      Past Surgical History:  Procedure Laterality Date  . APPENDECTOMY  1953  . CARDIAC CATHETERIZATION  01/06/2003   Recommend medical therapy  . CARDIOVASCULAR STRESS TEST  07/16/2012   Mild-moderate perfusion defect seen in Basal inferior, Mid inferior, and Apica lateral consistent with infarct/scar. No scintigraphic evidence for inducible myocardial ischemia. No ECG changes. EKG negative for ischemia.  Marland Kitchen CAROTID DOPPLER  11/04/2012   Proximal Rt ICA 50-99% diameter  reduction; Lft Bulb demonstrated mild amount homogeneous plaque-not hemodynamically significant; Lft ICA-normal patency.  . CHEST TUBE INSERTION Right 03/15/2016   Procedure: INSERTION PLEURAL DRAINAGE CATHETER;  Surgeon: Ivin Poot, MD;  Location: [place];  Service: Thoracic;  Laterality: Right;  . COLECTOMY     for lymphoma  . COLONOSCOPY    . neck fusion    . PACEMAKER GENERATOR CHANGE  01/31/2012   [employer] Jude Med Accent DR RF model [id number] serial [id number]  . PACEMAKER PLACEMENT     replaced 3 x  . PERMANENT PACEMAKER GENERATOR CHANGE N/A 01/31/2012   Procedure: PERMANENT PACEMAKER GENERATOR CHANGE;  Surgeon: Sanda Klein, MD;  Location: [place] CATH LAB;   . PLEURADESIS N/A 04/02/2016   Procedure: PLEURADESIS;  Surgeon: Ivin Poot, MD;  Location: [place];  Service: Thoracic;  Laterality: N/A;  . REMOVAL OF PLEURAL DRAINAGE CATHETER Right 07/26/2016   Procedure: REMOVAL OF PLEURAL DRAINAGE CATHETER;  Surgeon: Ivin Poot, MD;  Location: [place];  Service: Thoracic;  Laterality: Right;  . TONSILLECTOMY    . TRANSTHORACIC ECHOCARDIOGRAM  11/04/2012   EF 56-43%, systolic function moderately reduced, mild regurg of the aortic and mitral valves.  Marland Kitchen VIDEO ASSISTED THORACOSCOPY Right 04/02/2016   Procedure: Right VIDEO ASSISTED THORACOSCOPY with Biopsies and drainage pleural effusion;   Surgeon: Ivin Poot, MD;  Location: [facility] OR;  Service: Thoracic;  Laterality: Right;     Family History  Problem Relation Age of Onset  . Prostate cancer Father   . Heart disease Mother   . Stroke Mother   . Diabetes Maternal Grandmother   . Colon cancer Neg Hx   . Heart attack Neg Hx      Social History   Social History  . Marital status: Married    Spouse name: Chartered certified accountant  . Number of children: 3  . Years of education: 39   Occupational History  . Retired    Social History Main Topics  . Smoking status: Never Smoker  . Smokeless tobacco: Never Used  . Alcohol use No  . Drug use: No  . Sexual activity: Not on file   Other Topics Concern  . Not on file   Social History Narrative   Fun: Golf when he can   Consumes caffeine 2 cups per day       Review of Systems  Constitutional: Denies fever, chills, fatigue, or significant weight gain/loss. HENT: Head: Denies headache or neck pain Ears: Denies changes in hearing, ringing in ears, earache, drainage Nose: Denies discharge, stuffiness, itching, nosebleed, sinus pain Throat: Denies sore throat, hoarseness, dry mouth, sores, thrush Eyes: Denies loss/changes in vision, pain, redness, blurry/double vision, flashing lights Cardiovascular: Denies chest pain/discomfort, tightness, palpitations, shortness of breath with activity, difficulty lying down, swelling, sudden awakening with shortness of breath Respiratory: Denies shortness of breath, cough, sputum production, wheezing Gastrointestinal: Denies dysphasia, heartburn, change in appetite, nausea, change in bowel habits, rectal bleeding, constipation, diarrhea, yellow skin or eyes Genitourinary: Denies frequency, urgency, burning/pain, blood in urine, incontinence, change in urinary strength. Musculoskeletal: Denies muscle/joint pain, stiffness, back pain, redness or swelling of joints, trauma Skin: Denies rashes, lumps, itching, dryness, color changes, or  hair/nail changes Neurological: Denies dizziness, fainting, seizures, weakness, numbness, tingling, tremor Psychiatric - Denies nervousness, stress, depression or memory loss Endocrine: Denies heat or cold intolerance, sweating, frequent urination, excessive thirst, changes in appetite Hematologic: Denies ease of bruising or bleeding    Objective:     BP 136/84 (BP Location:  Left Arm, Patient Position: Sitting, Cuff Size: Large)   Pulse 66   Temp 98.3 F (36.8 C) (Oral)   Resp 16   Ht 5\' 9"  (1.753 m)   Wt 215 lb (97.5 kg)   SpO2 96%   BMI 31.75 kg/m  Nursing note and vital signs reviewed.  Physical Exam  Constitutional: He is oriented to person, place, and time. He appears well-developed and well-nourished.  HENT:  Head: Normocephalic.  Right Ear: Hearing, tympanic membrane, external ear and ear canal normal.  Left Ear: Hearing, tympanic membrane, external ear and ear canal normal.  Nose: Nose normal.  Mouth/Throat: Uvula is midline, oropharynx is clear and moist and mucous membranes are normal.  Eyes: Pupils are equal, round, and reactive to light. Conjunctivae and EOM are normal.  Neck: Neck supple. No JVD present. No tracheal deviation present. No thyromegaly present.  Cardiovascular: Normal rate, regular rhythm, normal heart sounds and intact distal pulses.   Pulmonary/Chest: Effort normal and breath sounds normal.  Abdominal: Soft. Bowel sounds are normal. He exhibits no distension and no mass. There is no tenderness. There is no rebound and no guarding.  Musculoskeletal: Normal range of motion. He exhibits no edema or tenderness.  Lymphadenopathy:    He has no cervical adenopathy.  Neurological: He is alert and oriented to person, place, and time. He has normal reflexes. No cranial nerve deficit. He exhibits normal muscle tone. Coordination normal.  Skin: Skin is warm and dry.  Psychiatric: He has a normal mood and affect. His behavior is normal. Judgment and thought  content normal.       Assessment & Plan:   During the course of the visit the patient was educated and counseled about appropriate screening and preventive services including:    Pneumococcal vaccine   Influenza vaccine  Td vaccine  Prostate cancer screening  Colorectal cancer screening  Diabetes screening  Glaucoma screening  Nutrition counseling   Diet review for nutrition referral? Yes ____  Not Indicated _X___   Patient Instructions (the written plan) was given to the patient.  Medicare Attestation I have personally reviewed: The patient's medical and social history Their use of alcohol, tobacco or illicit drugs Their current medications and supplements The patient's functional ability including ADLs,fall risks, home safety risks, cognitive, and hearing and visual impairment Diet and physical activities Evidence for depression or mood disorders  The patient's weight, height, BMI,  have been recorded in the chart.  I have made referrals, counseling, and provided education to the patient based on review of the above and I have provided the patient with a written personalized care plan for preventive services.     Problem List Items Addressed This Visit      Cardiovascular and Mediastinum   HTN (hypertension)    Blood pressure well-controlled and below goal 140/90 with current medication regimen and no adverse side effects. Continue current dosage of metoprolol and furosemide. Encouraged to monitor blood pressure at home and follow low-sodium diet. Continue to monitor.      Chronic atrial fibrillation (HCC)    Stable and anticoagulated with warfarin managed by cardiac Coumadin clinic. No adverse side effects or nuisance bleeding. Continue current dosage of warfarin with changes per cardiology.      Chronic systolic CHF (congestive heart failure) (HCC)    Stable and euvolemic with no symptoms of exacerbation. Most recent 2-D echocardiogram showed ejection  fraction of 45-50%. Maintained on metoprolol for sudden cardiac death risk reduction. Not currently maintained on Ace  inhibitor. Continue current dosage of metoprolol and furosemide. Monitor weights at home. Follow-up and changes per cardiology.      VT (ventricular tachycardia) (HCC)     Other   Hyperlipidemia    Hyperlipidemia appears adequately controlled through most recent lipid profile and below goal of LDL of 70. No adverse side effects. Continue current dosage of pravastatin.      Medicare annual wellness visit, subsequent - Primary    Reviewed and updated patient's medical, surgical, family and social history. Medications and allergies were also reviewed. Basic screenings for depression, activities of daily living, hearing, cognition and safety were performed. Provider list was updated and health plan was provided to the patient.       Routine adult health maintenance    1) Anticipatory Guidance: Discussed importance of wearing a seatbelt while driving and not texting while driving; changing batteries in smoke detector at least once annually; wearing suntan lotion when outside; eating a balanced and moderate diet; getting physical activity at least 30 minutes per day.  2) Immunizations / Screenings / Labs:  Influenza updated today. Declines tetanus. All other immunizations are up-to-date per recommendations. Colon cancer screening is up-to-date. PSA monitored by urology. Declines hepatitis C screening. All other screenings are up-to-date per recommendations. Obtain CBC, and complete metabolic profile.  Overall well exam with risk factors for cardiovascular disease including hypertension, heart failure, hyperlipidemia and atrial fibrillation. Chronic conditions appear adequately controlled current medication regimen and no adverse side effects. Overall he reports feeling very good. Encouraged increased physical activity to 2-3 days of moderate level activity or about 7-10,000 steps per  day. Continue other healthy lifestyle behaviors and choices. Follow-up prevention exam in 1 year. Follow-up office visit for chronic conditions and pending blood work as needed.      Relevant Orders   CBC (Completed)   Comprehensive metabolic panel (Completed)    Other Visit Diagnoses    Need for influenza vaccination       Relevant Orders   Flu vaccine HIGH DOSE PF (Fluzone High Dose) (Completed)       I have discontinued Benjamin Mckay ezetimibe. I am also having him maintain his tamsulosin, acetaminophen, pravastatin, warfarin, furosemide, metoprolol succinate, metoprolol succinate, and traZODone.   Meds ordered this encounter  Medications  . traZODone (DESYREL) 50 MG tablet    Sig: Take 0.5-1 tablets (25-50 mg total) by mouth at bedtime as needed for sleep.    Dispense:  30 tablet    Refill:  2    Order Specific Question:   Supervising Provider    Answer:   Pricilla Holm A [[id number]]     Follow-up: Return in about 6 months (around 01/19/2018), or if symptoms worsen or fail to improve.   Mauricio Po, FNP

## 2017-07-21 NOTE — Assessment & Plan Note (Signed)
Blood pressure well-controlled and below goal 140/90 with current medication regimen and no adverse side effects. Continue current dosage of metoprolol and furosemide. Encouraged to monitor blood pressure at home and follow low-sodium diet. Continue to monitor.

## 2017-07-21 NOTE — Assessment & Plan Note (Signed)
Stable and anticoagulated with warfarin managed by cardiac Coumadin clinic. No adverse side effects or nuisance bleeding. Continue current dosage of warfarin with changes per cardiology.

## 2017-07-21 NOTE — Assessment & Plan Note (Signed)
Stable and euvolemic with no symptoms of exacerbation. Most recent 2-D echocardiogram showed ejection fraction of 45-50%. Maintained on metoprolol for sudden cardiac death risk reduction. Not currently maintained on Ace inhibitor. Continue current dosage of metoprolol and furosemide. Monitor weights at home. Follow-up and changes per cardiology.

## 2017-07-21 NOTE — Assessment & Plan Note (Signed)
Hyperlipidemia appears adequately controlled through most recent lipid profile and below goal of LDL of 70. No adverse side effects. Continue current dosage of pravastatin.

## 2017-08-11 ENCOUNTER — Ambulatory Visit (INDEPENDENT_AMBULATORY_CARE_PROVIDER_SITE_OTHER): Payer: PPO | Admitting: Pharmacist

## 2017-08-11 DIAGNOSIS — I4821 Permanent atrial fibrillation: Secondary | ICD-10-CM

## 2017-08-11 DIAGNOSIS — I482 Chronic atrial fibrillation, unspecified: Secondary | ICD-10-CM

## 2017-08-11 DIAGNOSIS — I4891 Unspecified atrial fibrillation: Secondary | ICD-10-CM

## 2017-08-11 DIAGNOSIS — D225 Melanocytic nevi of trunk: Secondary | ICD-10-CM | POA: Diagnosis not present

## 2017-08-11 DIAGNOSIS — L812 Freckles: Secondary | ICD-10-CM | POA: Diagnosis not present

## 2017-08-11 DIAGNOSIS — L821 Other seborrheic keratosis: Secondary | ICD-10-CM | POA: Diagnosis not present

## 2017-08-11 DIAGNOSIS — D1801 Hemangioma of skin and subcutaneous tissue: Secondary | ICD-10-CM | POA: Diagnosis not present

## 2017-08-11 DIAGNOSIS — L570 Actinic keratosis: Secondary | ICD-10-CM | POA: Diagnosis not present

## 2017-08-11 DIAGNOSIS — Z7901 Long term (current) use of anticoagulants: Secondary | ICD-10-CM

## 2017-08-11 LAB — POCT INR: INR: 2.3

## 2017-08-11 MED ORDER — WARFARIN SODIUM 2.5 MG PO TABS: ORAL_TABLET | ORAL | 1 refills | Status: DC

## 2017-08-28 ENCOUNTER — Ambulatory Visit: Payer: PPO | Admitting: Nurse Practitioner

## 2017-08-28 ENCOUNTER — Encounter: Payer: Self-pay | Admitting: Nurse Practitioner

## 2017-08-28 VITALS — BP 132/80 | HR 82 | Temp 97.7°F | Resp 16 | Ht 69.0 in | Wt 216.0 lb

## 2017-08-28 DIAGNOSIS — G4700 Insomnia, unspecified: Secondary | ICD-10-CM

## 2017-08-28 NOTE — Progress Notes (Addendum)
Subjective:    Patient ID: Benjamin Mckay, male    DOB: 09-05-1946, 71 y.o.   MRN: [id number]  HPI Benjamin Mckay is a 71 yo male who presents today to establish care. He is transferring to me from another provider in the same clinic.  He has multiple chronic medical conditions and he follows with multiple specialties including two cardiologists and urology for these conditions. Currently his only medication prescribed by primary care is trazodone for insomnia. He had an annual wellness visit last month  Insomnia- chronic for years. Maintained on trazodone. He first began to experience insomnia when he began to have heart problems years ago. Once he retired he began to notice the insomnia was worse. He reports minimal exercise, walking about 2 days a week, and staying up late at night to watch TV since retirement.   He was placed on trazodone last year which has improved his sleep some. He feels that he still has some trouble falling asleep but is able to stay asleep once he does fall asleep. He sleeps about 6 hours a night. He occassionally takes melatonin in addition to his trazodone on the nights he has more trouble sleeping.   Review of Systems  See HPI  Past Medical History:  Diagnosis Date  . Anxiety   . Atrial fibrillation ([place])   . Cardiomyopathy, ischemic 11/07/2013  . Cataract   . CHB (complete heart block) ([place]) 11/06/2013  . Chronic combined systolic and diastolic CHF (congestive heart failure) ([place])   . Colon polyps   . Colon polyps   . Congenital heart block   . Constipation   . CVA (cerebral infarction)   . Depression   . Diverticulosis   . Esophageal stricture   . Family history of adverse reaction to anesthesia   . Hyperlipidemia   . Hypertension   . Hyperthyroidism 11/16/2015  . Lymphoma ([place]) 1998   of colon   . Presence of permanent cardiac pacemaker    [employer]  pt.states he is totally dependent on pacemaker  . Shortness of breath dyspnea    Pulmonary Effusion   . Stroke [facility])    [id number]- TIA  . Umbilical hernia   . Urinary frequency      Social History   Socioeconomic History  . Marital status: Married    Spouse name: Chartered certified accountant  . Number of children: 3  . Years of education: 61  . Highest education level: Not on file  Social Needs  . Financial resource strain: Not on file  . Food insecurity - worry: Not on file  . Food insecurity - inability: Not on file  . Transportation needs - medical: Not on file  . Transportation needs - non-medical: Not on file  Occupational History  . Occupation: Retired  Tobacco Use  . Smoking status: Never Smoker  . Smokeless tobacco: Never Used  Substance and Sexual Activity  . Alcohol use: No    Alcohol/week: 0.0 oz  . Drug use: No  . Sexual activity: Not on file  Other Topics Concern  . Not on file  Social History Narrative   Fun: Golf when he can   Consumes caffeine 2 cups per day      Past Surgical History:  Procedure Laterality Date  . APPENDECTOMY  1953  . CARDIAC CATHETERIZATION  01/06/2003   Recommend medical therapy  . CARDIOVASCULAR STRESS TEST  07/16/2012   Mild-moderate perfusion defect seen in Basal inferior, Mid inferior, and Apica lateral consistent with  infarct/scar. No scintigraphic evidence for inducible myocardial ischemia. No ECG changes. EKG negative for ischemia.  Marland Kitchen CAROTID DOPPLER  11/04/2012   Proximal Rt ICA 50-99% diameter reduction; Lft Bulb demonstrated mild amount homogeneous plaque-not hemodynamically significant; Lft ICA-normal patency.  . CHEST TUBE INSERTION Right 03/15/2016   Procedure: INSERTION PLEURAL DRAINAGE CATHETER;  Surgeon: Ivin Poot, MD;  Location: [place];  Service: Thoracic;  Laterality: Right;  . COLECTOMY     for lymphoma  . COLONOSCOPY    . neck fusion    . PACEMAKER GENERATOR CHANGE  01/31/2012   [employer] Med Accent DR RF model [id number] serial [id number]  . PACEMAKER PLACEMENT     replaced 3 x  . PERMANENT PACEMAKER GENERATOR CHANGE N/A  01/31/2012   Procedure: PERMANENT PACEMAKER GENERATOR CHANGE;  Surgeon: Sanda Klein, MD;  Location: [place] CATH LAB;   . PLEURADESIS N/A 04/02/2016   Procedure: PLEURADESIS;  Surgeon: Ivin Poot, MD;  Location: [place];  Service: Thoracic;  Laterality: N/A;  . REMOVAL OF PLEURAL DRAINAGE CATHETER Right 07/26/2016   Procedure: REMOVAL OF PLEURAL DRAINAGE CATHETER;  Surgeon: Ivin Poot, MD;  Location: [place];  Service: Thoracic;  Laterality: Right;  . TONSILLECTOMY    . TRANSTHORACIC ECHOCARDIOGRAM  11/04/2012   EF 12-45%, systolic function moderately reduced, mild regurg of the aortic and mitral valves.  Marland Kitchen VIDEO ASSISTED THORACOSCOPY Right 04/02/2016   Procedure: Right VIDEO ASSISTED THORACOSCOPY with Biopsies and drainage pleural effusion;  Surgeon: Ivin Poot, MD;  Location[facility];  Service: Thoracic;  Laterality: Right;    Family History  Problem Relation Age of Onset  . Prostate cancer Father   . Heart disease Mother   . Stroke Mother   . Diabetes Maternal Grandmother   . Colon cancer Neg Hx   . Heart attack Neg Hx     Allergies  Allergen Reactions  . Statins Other (See Comments)    Leg cramps.   Tolerates pravastatin.    . Adhesive [Tape] Other (See Comments)    Skin irritation - please use paper tape  . Latex Other (See Comments)    Skin irritation  . Penicillins Rash    Has patient had a PCN reaction causing immediate rash, facial/tongue/throat swelling, SOB or lightheadedness with hypotension: Yes Has patient had a PCN reaction causing severe rash involving mucus membranes or skin necrosis: No Has patient had a PCN reaction that required hospitalization No Has patient had a PCN reaction occurring within the last 10 years: No If all of the above answers are "NO", then may proceed with Cephalosporin use.    Current Outpatient Medications on File Prior to Visit  Medication Sig Dispense Refill  . acetaminophen (TYLENOL) 500 MG tablet Take 1,000 mg by mouth every 6  (six) hours as needed for moderate pain.     . furosemide (LASIX) 20 MG tablet TAKE 1-2 TABLETS BY MOUTH DAILY AS DIRECTED 180 tablet 1  . metoprolol succinate (TOPROL-XL) 25 MG 24 hr tablet Take 1 tablet (25 mg total) by mouth every evening. 90 tablet 3  . metoprolol succinate (TOPROL-XL) 50 MG 24 hr tablet Take 1 tablet (50 mg total) by mouth every morning. Take with or immediately following a meal. 90 tablet 3  . pravastatin (PRAVACHOL) 80 MG tablet Take 1 tablet (80 mg total) by mouth daily. 90 tablet 2  . Tamsulosin HCl (FLOMAX) 0.4 MG CAPS Take 0.4 mg by mouth at bedtime.     . traZODone (DESYREL) 50  MG tablet Take 0.5-1 tablets (25-50 mg total) by mouth at bedtime as needed for sleep. 30 tablet 2  . warfarin (COUMADIN) 2.5 MG tablet Take 1 to 1 and 1/2 tablets by mouth daily as directed by coumadin clinic 135 tablet 1   No current facility-administered medications on file prior to visit.     BP 132/80 (BP Location: Left Arm, Patient Position: Sitting, Cuff Size: Large)   Pulse 82   Temp 97.7 F (36.5 C) (Oral)   Resp 16   Ht 5\' 9"  (1.753 m)   Wt 216 lb (98 kg)   SpO2 96%   BMI 31.90 kg/m      Objective:   Physical Exam  Constitutional: He is oriented to person, place, and time. He appears well-developed and well-nourished. No distress.  HENT:  Head: Normocephalic and atraumatic.  Cardiovascular: Normal rate, regular rhythm and intact distal pulses.  Pulmonary/Chest: Effort normal and breath sounds normal.  Neurological: He is alert and oriented to person, place, and time. Coordination normal.  Skin: Skin is warm and dry.  Psychiatric: He has a normal mood and affect. Judgment and thought content normal.      Assessment & Plan:  RTC for annual physical next October or sooner if needed.

## 2017-08-28 NOTE — Patient Instructions (Addendum)
I have attached some reading for you on things to do to improve your sleep.  Please let me know if we need to make any changes to your sleep medication.  Since you are seeing specialty for your chronic conditions, I think it would be okay if we see each other once a year for a physical. Of course I am here anytime you need me or if you need any help with any of your chronic conditions.  It was nice to meet you. Thanks for letting me take care of you today :)    Insomnia Insomnia is a sleep disorder that makes it difficult to fall asleep or to stay asleep. Insomnia can cause tiredness (fatigue), low energy, difficulty concentrating, mood swings, and poor performance at work or school. There are three different ways to classify insomnia:  Difficulty falling asleep.  Difficulty staying asleep.  Waking up too early in the morning.  Any type of insomnia can be long-term (chronic) or short-term (acute). Both are common. Short-term insomnia usually lasts for three months or less. Chronic insomnia occurs at least three times a week for longer than three months. What are the causes? Insomnia may be caused by another condition, situation, or substance, such as:  Anxiety.  Certain medicines.  Gastroesophageal reflux disease (GERD) or other gastrointestinal conditions.  Asthma or other breathing conditions.  Restless legs syndrome, sleep apnea, or other sleep disorders.  Chronic pain.  Menopause. This may include hot flashes.  Stroke.  Abuse of alcohol, tobacco, or illegal drugs.  Depression.  Caffeine.  Neurological disorders, such as Alzheimer disease.  An overactive thyroid (hyperthyroidism).  The cause of insomnia may not be known. What increases the risk? Risk factors for insomnia include:  Gender. Women are more commonly affected than men.  Age. Insomnia is more common as you get older.  Stress. This may involve your professional or personal life.  Income.  Insomnia is more common in people with lower income.  Lack of exercise.  Irregular work schedule or night shifts.  Traveling between different time zones.  What are the signs or symptoms? If you have insomnia, trouble falling asleep or trouble staying asleep is the main symptom. This may lead to other symptoms, such as:  Feeling fatigued.  Feeling nervous about going to sleep.  Not feeling rested in the morning.  Having trouble concentrating.  Feeling irritable, anxious, or depressed.  How is this treated? Treatment for insomnia depends on the cause. If your insomnia is caused by an underlying condition, treatment will focus on addressing the condition. Treatment may also include:  Medicines to help you sleep.  Counseling or therapy.  Lifestyle adjustments.  Follow these instructions at home:  Take medicines only as directed by your health care provider.  Keep regular sleeping and waking hours. Avoid naps.  Keep a sleep diary to help you and your health care provider figure out what could be causing your insomnia. Include: ? When you sleep. ? When you wake up during the night. ? How well you sleep. ? How rested you feel the next day. ? Any side effects of medicines you are taking. ? What you eat and drink.  Make your bedroom a comfortable place where it is easy to fall asleep: ? Put up shades or special blackout curtains to block light from outside. ? Use a white noise machine to block noise. ? Keep the temperature cool.  Exercise regularly as directed by your health care provider. Avoid exercising right before bedtime.  Use relaxation techniques to manage stress. Ask your health care provider to suggest some techniques that may work well for you. These may include: ? Breathing exercises. ? Routines to release muscle tension. ? Visualizing peaceful scenes.  Cut back on alcohol, caffeinated beverages, and cigarettes, especially close to bedtime. These can  disrupt your sleep.  Do not overeat or eat spicy foods right before bedtime. This can lead to digestive discomfort that can make it hard for you to sleep.  Limit screen use before bedtime. This includes: ? Watching TV. ? Using your smartphone, tablet, and computer.  Stick to a routine. This can help you fall asleep faster. Try to do a quiet activity, brush your teeth, and go to bed at the same time each night.  Get out of bed if you are still awake after 15 minutes of trying to sleep. Keep the lights down, but try reading or doing a quiet activity. When you feel sleepy, go back to bed.  Make sure that you drive carefully. Avoid driving if you feel very sleepy.  Keep all follow-up appointments as directed by your health care provider. This is important. Contact a health care provider if:  You are tired throughout the day or have trouble in your daily routine due to sleepiness.  You continue to have sleep problems or your sleep problems get worse. Get help right away if:  You have serious thoughts about hurting yourself or someone else. This information is not intended to replace advice given to you by your health care provider. Make sure you discuss any questions you have with your health care provider. Document Released: 09/27/2000 Document Revised: 03/01/2016 Document Reviewed: 07/01/2014 Elsevier Interactive Patient Education  [employer].

## 2017-08-28 NOTE — Assessment & Plan Note (Signed)
Maintained on trazodone. Occasionally adds melatonin on nights that he has more trouble sleeping.  He does have trouble falling asleep but is able to stay asleep through the night and sleeps about 6 hours a night. hes not interested in changing his medication therapy.  We discussed sleep hygiene today. Insomnia handout given.

## 2017-08-29 ENCOUNTER — Other Ambulatory Visit: Payer: Self-pay | Admitting: Cardiovascular Disease

## 2017-09-03 ENCOUNTER — Ambulatory Visit (INDEPENDENT_AMBULATORY_CARE_PROVIDER_SITE_OTHER): Payer: PPO | Admitting: *Deleted

## 2017-09-03 DIAGNOSIS — I442 Atrioventricular block, complete: Secondary | ICD-10-CM

## 2017-09-03 NOTE — Progress Notes (Signed)
Remote pacemaker transmission.   

## 2017-09-12 ENCOUNTER — Encounter: Payer: Self-pay | Admitting: Cardiology

## 2017-09-23 LAB — CUP PACEART REMOTE DEVICE CHECK
Battery Voltage: 2.96 V
Brady Statistic RV Percent Paced: 40 %
Date Time Interrogation Session: 20181119083849
Implantable Lead Location: 753860
Lead Channel Impedance Value: 450 Ohm
Lead Channel Setting Pacing Amplitude: 2.5 V
Lead Channel Setting Pacing Pulse Width: 0.5 ms
MDC IDC LEAD IMPLANT DT: 20130419
MDC IDC MSMT BATTERY REMAINING LONGEVITY: 118 mo
MDC IDC MSMT BATTERY REMAINING PERCENTAGE: 95.5 %
MDC IDC MSMT LEADCHNL RV PACING THRESHOLD AMPLITUDE: 0.75 V
MDC IDC MSMT LEADCHNL RV PACING THRESHOLD PULSEWIDTH: 0.5 ms
MDC IDC MSMT LEADCHNL RV SENSING INTR AMPL: 10.8 mV
MDC IDC PG IMPLANT DT: 20130419
MDC IDC SET LEADCHNL RV SENSING SENSITIVITY: 2.5 mV
Pulse Gen Serial Number: 7313697

## 2017-09-30 ENCOUNTER — Ambulatory Visit (INDEPENDENT_AMBULATORY_CARE_PROVIDER_SITE_OTHER): Payer: PPO | Admitting: Pharmacist

## 2017-09-30 DIAGNOSIS — I482 Chronic atrial fibrillation, unspecified: Secondary | ICD-10-CM

## 2017-09-30 DIAGNOSIS — I4821 Permanent atrial fibrillation: Secondary | ICD-10-CM

## 2017-09-30 DIAGNOSIS — I4891 Unspecified atrial fibrillation: Secondary | ICD-10-CM

## 2017-09-30 DIAGNOSIS — Z7901 Long term (current) use of anticoagulants: Secondary | ICD-10-CM

## 2017-09-30 LAB — POCT INR: INR: 1.4

## 2017-10-01 DIAGNOSIS — N401 Enlarged prostate with lower urinary tract symptoms: Secondary | ICD-10-CM | POA: Diagnosis not present

## 2017-10-01 DIAGNOSIS — R3914 Feeling of incomplete bladder emptying: Secondary | ICD-10-CM | POA: Diagnosis not present

## 2017-10-01 LAB — CUP PACEART INCLINIC DEVICE CHECK
Date Time Interrogation Session: 20181219101727
Implantable Lead Implant Date: 20130419
Implantable Pulse Generator Implant Date: 20130419
Lead Channel Setting Pacing Pulse Width: 0.5 ms
Lead Channel Setting Sensing Sensitivity: 2.5 mV
MDC IDC LEAD LOCATION: 753860
MDC IDC PG SERIAL: 7313697
MDC IDC SET LEADCHNL RV PACING AMPLITUDE: 2.5 V
Pulse Gen Model: 1210

## 2017-10-09 ENCOUNTER — Other Ambulatory Visit: Payer: Self-pay | Admitting: Cardiovascular Disease

## 2017-10-21 ENCOUNTER — Ambulatory Visit (INDEPENDENT_AMBULATORY_CARE_PROVIDER_SITE_OTHER): Payer: PPO | Admitting: Pharmacist Clinician (PhC)/ Clinical Pharmacy Specialist

## 2017-10-21 DIAGNOSIS — I4821 Permanent atrial fibrillation: Secondary | ICD-10-CM

## 2017-10-21 DIAGNOSIS — I482 Chronic atrial fibrillation, unspecified: Secondary | ICD-10-CM

## 2017-10-21 DIAGNOSIS — I4891 Unspecified atrial fibrillation: Secondary | ICD-10-CM | POA: Diagnosis not present

## 2017-10-21 DIAGNOSIS — Z7901 Long term (current) use of anticoagulants: Secondary | ICD-10-CM | POA: Diagnosis not present

## 2017-10-21 LAB — POCT INR: INR: 1.5

## 2017-10-21 NOTE — Patient Instructions (Signed)
Description   Take 2 tablets Tuesday Jan 8 and Wednesday Jan 9, then increase dose to 1 tablet each Friday, 1.5 tablets all other days.  Repeat INR in 2 weeks

## 2017-10-24 DIAGNOSIS — H26492 Other secondary cataract, left eye: Secondary | ICD-10-CM | POA: Diagnosis not present

## 2017-10-24 DIAGNOSIS — H1849 Other corneal degeneration: Secondary | ICD-10-CM | POA: Diagnosis not present

## 2017-10-24 DIAGNOSIS — Z961 Presence of intraocular lens: Secondary | ICD-10-CM | POA: Diagnosis not present

## 2017-11-05 ENCOUNTER — Ambulatory Visit (INDEPENDENT_AMBULATORY_CARE_PROVIDER_SITE_OTHER): Payer: PPO | Admitting: Pharmacist

## 2017-11-05 DIAGNOSIS — I4891 Unspecified atrial fibrillation: Secondary | ICD-10-CM | POA: Diagnosis not present

## 2017-11-05 DIAGNOSIS — Z7901 Long term (current) use of anticoagulants: Secondary | ICD-10-CM

## 2017-11-05 DIAGNOSIS — I482 Chronic atrial fibrillation, unspecified: Secondary | ICD-10-CM

## 2017-11-05 DIAGNOSIS — I4821 Permanent atrial fibrillation: Secondary | ICD-10-CM

## 2017-11-05 LAB — POCT INR: INR: 1.8

## 2017-11-05 MED ORDER — PRAVASTATIN SODIUM 80 MG PO TABS: 80.0000 mg | ORAL_TABLET | Freq: Every day | ORAL | 3 refills | Status: DC

## 2017-11-06 ENCOUNTER — Other Ambulatory Visit: Payer: Self-pay

## 2017-11-06 MED ORDER — TRAZODONE HCL 50 MG PO TABS: 25.0000 mg | ORAL_TABLET | Freq: Every evening | ORAL | 2 refills | Status: DC | PRN

## 2017-11-26 ENCOUNTER — Ambulatory Visit (INDEPENDENT_AMBULATORY_CARE_PROVIDER_SITE_OTHER): Payer: PPO | Admitting: Pharmacist Clinician (PhC)/ Clinical Pharmacy Specialist

## 2017-11-26 DIAGNOSIS — I482 Chronic atrial fibrillation, unspecified: Secondary | ICD-10-CM

## 2017-11-26 DIAGNOSIS — I4891 Unspecified atrial fibrillation: Secondary | ICD-10-CM

## 2017-11-26 DIAGNOSIS — Z7901 Long term (current) use of anticoagulants: Secondary | ICD-10-CM | POA: Diagnosis not present

## 2017-11-26 DIAGNOSIS — I4821 Permanent atrial fibrillation: Secondary | ICD-10-CM

## 2017-11-26 LAB — POCT INR: INR: 2.6

## 2017-11-26 NOTE — Patient Instructions (Signed)
Description   Continue with 1.5 tablets every day.  Repeat INR in 4 weeks     

## 2017-12-03 ENCOUNTER — Telehealth: Payer: Self-pay | Admitting: Cardiology

## 2017-12-03 ENCOUNTER — Ambulatory Visit (INDEPENDENT_AMBULATORY_CARE_PROVIDER_SITE_OTHER): Payer: PPO | Admitting: *Deleted

## 2017-12-03 DIAGNOSIS — I442 Atrioventricular block, complete: Secondary | ICD-10-CM

## 2017-12-03 NOTE — Telephone Encounter (Signed)
Spoke with pt and reminded pt of remote transmission that is due today. Pt verbalized understanding.   

## 2017-12-04 ENCOUNTER — Encounter: Payer: Self-pay | Admitting: Cardiology

## 2017-12-04 NOTE — Progress Notes (Signed)
Remote pacemaker transmission.   

## 2017-12-11 LAB — CUP PACEART REMOTE DEVICE CHECK
Battery Remaining Longevity: 118 mo
Battery Remaining Percentage: 95.5 %
Battery Voltage: 2.96 V
Brady Statistic RV Percent Paced: 43 %
Implantable Lead Location: 753860
Implantable Pulse Generator Implant Date: 20130419
Lead Channel Pacing Threshold Pulse Width: 0.5 ms
Lead Channel Setting Pacing Amplitude: 2.5 V
Lead Channel Setting Pacing Pulse Width: 0.5 ms
Lead Channel Setting Sensing Sensitivity: 2.5 mV
MDC IDC LEAD IMPLANT DT: 20130419
MDC IDC MSMT LEADCHNL RV IMPEDANCE VALUE: 450 Ohm
MDC IDC MSMT LEADCHNL RV PACING THRESHOLD AMPLITUDE: 0.75 V
MDC IDC MSMT LEADCHNL RV SENSING INTR AMPL: 9.9 mV
MDC IDC PG SERIAL: 7313697
MDC IDC SESS DTM: 20190220110854

## 2017-12-19 ENCOUNTER — Ambulatory Visit (INDEPENDENT_AMBULATORY_CARE_PROVIDER_SITE_OTHER): Payer: PPO | Admitting: Pharmacist

## 2017-12-19 ENCOUNTER — Ambulatory Visit: Payer: PPO | Admitting: Cardiovascular Disease

## 2017-12-19 ENCOUNTER — Encounter: Payer: Self-pay | Admitting: Cardiovascular Disease

## 2017-12-19 VITALS — BP 138/84 | HR 82 | Ht 69.0 in | Wt 217.0 lb

## 2017-12-19 DIAGNOSIS — I5042 Chronic combined systolic (congestive) and diastolic (congestive) heart failure: Secondary | ICD-10-CM | POA: Diagnosis not present

## 2017-12-19 DIAGNOSIS — Z7901 Long term (current) use of anticoagulants: Secondary | ICD-10-CM

## 2017-12-19 DIAGNOSIS — I442 Atrioventricular block, complete: Secondary | ICD-10-CM | POA: Diagnosis not present

## 2017-12-19 DIAGNOSIS — I701 Atherosclerosis of renal artery: Secondary | ICD-10-CM

## 2017-12-19 DIAGNOSIS — J90 Pleural effusion, not elsewhere classified: Secondary | ICD-10-CM

## 2017-12-19 DIAGNOSIS — E7800 Pure hypercholesterolemia, unspecified: Secondary | ICD-10-CM

## 2017-12-19 DIAGNOSIS — I482 Chronic atrial fibrillation, unspecified: Secondary | ICD-10-CM

## 2017-12-19 DIAGNOSIS — Z950 Presence of cardiac pacemaker: Secondary | ICD-10-CM

## 2017-12-19 DIAGNOSIS — I10 Essential (primary) hypertension: Secondary | ICD-10-CM

## 2017-12-19 DIAGNOSIS — I472 Ventricular tachycardia, unspecified: Secondary | ICD-10-CM

## 2017-12-19 DIAGNOSIS — I2510 Atherosclerotic heart disease of native coronary artery without angina pectoris: Secondary | ICD-10-CM

## 2017-12-19 DIAGNOSIS — I6523 Occlusion and stenosis of bilateral carotid arteries: Secondary | ICD-10-CM

## 2017-12-19 LAB — POCT INR: INR: 2.7

## 2017-12-19 NOTE — Progress Notes (Signed)
. Patient ID: Benjamin Mckay, male   DOB: Mar 12, 1946, 72 y.o.   MRN: [id number] Patient ID: Benjamin Mckay [facility], male   DOB: Sep 07, 1946, 72 y.o.   MRN: [id number]    Cardiology Office Note    Date:  12/19/2017   ID:  Benjamin Mckay, DOB 1945-12-24, MRN [id number]  PCP:  Lance Sell, NP  Cardiologist:   Sanda Klein, MD   Chief complaint: Pacemaker check   History of Present Illness:  Benjamin Mckay is a 72 y.o. male with complex cardiac history (complete heart block-pacemaker dependent, chronic atrial fibrillation versus atrial standstill, history of sustained ventricular tachycardia and near syncope, coronary artery disease with history of inferior wall scar due to myocardial infarction, renal and carotid artery stenosis), amiodarone induced hyperthyroidism (now quiescent) and a recurrent right pleural effusion (s/p pleurodesis June 2017).  He feels great.  He has almost returned to his previous baseline before he had his problems with a recurrent pleural effusion and thyrotoxicosis.  He has been steadily gaining weight.  Overall appears to have [place] heart association functional class II status.  He only notices shortness of breath if he has to climb steps.  He has not had syncope, palpitations, orthopnea, PND or bleeding problems.  He is planning a cruise to the [place] in a couple of weeks.  He is taking furosemide 20 mg once daily and double the dose roughly once a week when he notices weight gain  Electrocardiogram today at first glance seems to show sinus rhythm, but I think it is actually atrial flutter with mostly 2: 1 AV conduction.  There is no evidence of ventricular pacing today.  Rate is about 82 bpm.  The QRS is as always very broad at over 140 ms.  Pattern is of atypical left bundle branch block left axis deviation.  Normal QTC 427 ms  Recent pacemaker interrogation showed roughly 43% right ventricular pacing with occasional episodes of high ventricular rate  that are generally not sustained, no convincing evidence of ventricular tachycardia.   Past Medical History:  Diagnosis Date  . Anxiety   . Atrial fibrillation ([place])   . Cardiomyopathy, ischemic 11/07/2013  . Cataract   . CHB (complete heart block) ([place]) 11/06/2013  . Chronic combined systolic and diastolic CHF (congestive heart failure) ([place])   . Colon polyps   . Colon polyps   . Congenital heart block   . Constipation   . CVA (cerebral infarction)   . Depression   . Diverticulosis   . Esophageal stricture   . Family history of adverse reaction to anesthesia   . Hyperlipidemia   . Hypertension   . Hyperthyroidism 11/16/2015  . Lymphoma ([place]) 1998   of colon   . Presence of permanent cardiac pacemaker    St. Jude  pt.states he is totally dependent on pacemaker  . Shortness of breath dyspnea    Pulmonary Effusion  . Stroke [facility])    [id number] TIA  . Umbilical hernia   . Urinary frequency     Past Surgical History:  Procedure Laterality Date  . APPENDECTOMY  1953  . CARDIAC CATHETERIZATION  01/06/2003   Recommend medical therapy  . CARDIOVASCULAR STRESS TEST  07/16/2012   Mild-moderate perfusion defect seen in Basal inferior, Mid inferior, and Apica lateral consistent with infarct/scar. No scintigraphic evidence for inducible myocardial ischemia. No ECG changes. EKG negative for ischemia.  Marland Kitchen CAROTID DOPPLER  11/04/2012   Proximal Rt ICA 50-99% diameter reduction; Lft  Bulb demonstrated mild amount homogeneous plaque-not hemodynamically significant; Lft ICA-normal patency.  . CHEST TUBE INSERTION Right 03/15/2016   Procedure: INSERTION PLEURAL DRAINAGE CATHETER;  Surgeon: Ivin Poot, MD;  Location: [place];  Service: Thoracic;  Laterality: Right;  . COLECTOMY     for lymphoma  . COLONOSCOPY    . neck fusion    . PACEMAKER GENERATOR CHANGE  01/31/2012   St Jude Med Accent DR RF model [id number] serial [id number]  . PACEMAKER PLACEMENT     replaced 3 x  . PERMANENT PACEMAKER  GENERATOR CHANGE N/A 01/31/2012   Procedure: PERMANENT PACEMAKER GENERATOR CHANGE;  Surgeon: Sanda Klein, MD;  Location: [place] CATH LAB;   . PLEURADESIS N/A 04/02/2016   Procedure: PLEURADESIS;  Surgeon: Ivin Poot, MD;  Location: [place];  Service: Thoracic;  Laterality: N/A;  . REMOVAL OF PLEURAL DRAINAGE CATHETER Right 07/26/2016   Procedure: REMOVAL OF PLEURAL DRAINAGE CATHETER;  Surgeon: Ivin Poot, MD;  Location: [place];  Service: Thoracic;  Laterality: Right;  . TONSILLECTOMY    . TRANSTHORACIC ECHOCARDIOGRAM  11/04/2012   EF 48-54%, systolic function moderately reduced, mild regurg of the aortic and mitral valves.  Marland Kitchen VIDEO ASSISTED THORACOSCOPY Right 04/02/2016   Procedure: Right VIDEO ASSISTED THORACOSCOPY with Biopsies and drainage pleural effusion;  Surgeon: Ivin Poot, MD;  Location: [facility];  Service: Thoracic;  Laterality: Right;    Current Medications: Outpatient Medications Prior to Visit  Medication Sig Dispense Refill  . acetaminophen (TYLENOL) 500 MG tablet Take 1,000 mg by mouth every 6 (six) hours as needed for moderate pain.     . furosemide (LASIX) 20 MG tablet TAKE 1 TO 2 TABLETS BY MOUTH DAILY AS DIRECTED 180 tablet 1  . metoprolol succinate (TOPROL-XL) 25 MG 24 hr tablet Take 1 tablet (25 mg total) by mouth every evening. (Patient taking differently: Take 25 mg by mouth 2 (two) times daily. ) 90 tablet 3  . pravastatin (PRAVACHOL) 80 MG tablet Take 1 tablet (80 mg total) by mouth daily. 90 tablet 3  . Tamsulosin HCl (FLOMAX) 0.4 MG CAPS Take 0.4 mg by mouth at bedtime.     . traZODone (DESYREL) 50 MG tablet Take 0.5-1 tablets (25-50 mg total) by mouth at bedtime as needed for sleep. 30 tablet 2  . warfarin (COUMADIN) 2.5 MG tablet Take 1 to 1 and 1/2 tablets by mouth daily as directed by coumadin clinic 135 tablet 1  . metoprolol succinate (TOPROL-XL) 50 MG 24 hr tablet Take 1 tablet (50 mg total) by mouth every morning. Take with or immediately following a  meal. 90 tablet 3   No facility-administered medications prior to visit.      Allergies:   Statins; Adhesive [tape]; Latex; and Penicillins   Social History   Socioeconomic History  . Marital status: Married    Spouse name: Chartered certified accountant  . Number of children: 3  . Years of education: 52  . Highest education level: None  Social Needs  . Financial resource strain: None  . Food insecurity - worry: None  . Food insecurity - inability: None  . Transportation needs - medical: None  . Transportation needs - non-medical: None  Occupational History  . Occupation: Retired  Tobacco Use  . Smoking status: Never Smoker  . Smokeless tobacco: Never Used  Substance and Sexual Activity  . Alcohol use: No    Alcohol/week: 0.0 oz  . Drug use: No  . Sexual activity: None  Other Topics Concern  .  None  Social History Narrative   Fun: Golf when he can   Consumes caffeine 2 cups per day       Family History:  The patient's family history includes Diabetes in his maternal grandmother; Heart disease in his mother; Prostate cancer in his father; Stroke in his mother.   ROS:   Please see the history of present illness.    ROS All other systems reviewed and are negative.   PHYSICAL EXAM:   VS:  BP 138/84   Pulse 82   Ht 5\' 9"  (1.753 m)   Wt 217 lb (98.4 kg)   BMI 32.05 kg/m     General: Alert, oriented x3, no distress, mildly obese, healthy left subclavian pacemaker site Head: no evidence of trauma, PERRL, EOMI, no exophtalmos or lid lag, no myxedema, no xanthelasma; normal ears, nose and oropharynx Neck: normal jugular venous pulsations and no hepatojugular reflux; brisk carotid pulses without delay and no carotid bruits Chest: clear to auscultation, no signs of consolidation by percussion or palpation, normal fremitus, symmetrical and full respiratory excursions Cardiovascular: normal position and quality of the apical impulse, regular rhythm, normal first and second heart sounds,  no murmurs, rubs or gallops Abdomen: no tenderness or distention, no masses by palpation, no abnormal pulsatility or arterial bruits, normal bowel sounds, no hepatosplenomegaly Extremities: no clubbing, cyanosis or edema; 2+ radial, ulnar and brachial pulses bilaterally; 2+ right femoral, posterior tibial and dorsalis pedis pulses; 2+ left femoral, posterior tibial and dorsalis pedis pulses; no subclavian or femoral bruits Neurological: grossly nonfocal Psych: Normal mood and affect   Wt Readings from Last 3 Encounters:  12/19/17 217 lb (98.4 kg)  08/28/17 216 lb (98 kg)  07/21/17 215 lb (97.5 kg)      Studies/Labs Reviewed:   EKG:  EKG is ordered today.  He appears to be an atrial flutter with 2: 1 AV block, left bundle branch block, left axis deviation.  Cannot entirely exclude that the background rhythm is coarse atrial fibrillation and that his ventricular rhythm is junctional or idioventricular Recent Labs: 07/21/2017: ALT 9; BUN 13; Creatinine, Ser 0.90; Hemoglobin 15.5; Platelets 177.0; Potassium 3.9; Sodium 138   Lipid Panel    Component Value Date/Time   CHOL 172 06/04/2017 0923   TRIG 109 06/04/2017 0923   HDL 35 (L) 06/04/2017 0923   CHOLHDL 4.9 06/04/2017 0923   CHOLHDL 4.6 07/30/2016 0900   VLDL 13 07/30/2016 0900   LDLCALC 115 (H) 06/04/2017 1660     ASSESSMENT:    1. Chronic combined systolic and diastolic congestive heart failure ([place])   2. CHB (complete heart block) ([facility])   3. Chronic atrial fibrillation ([place])   4. Coronary artery disease involving native coronary artery of native heart without angina pectoris   5. Essential hypertension   6. Single chamber St. Jude pacemaker 2013   7. VT (ventricular tachycardia) ([place])   8. Warfarin anticoagulation   9. Renal artery stenosis ([place])   10. Bilateral carotid artery stenosis   11. Pleural effusion, right   12. Pure hypercholesterolemia      PLAN:  In order of problems listed above:  1. CHF: Clinically  euvolemic, NYHA functional class II.  Requiring a relatively low dose of loop diuretic.  Advised him to be cautious with sodium restriction on his cruise.   2. CHB: He is clearly not pacemaker dependent and only requires 43% ventricular pacing.  I think most of his episodes of ventricular sensed rhythm represent atrial fibrillation or  atrial flutter with variable AV conduction, but he often appears to have accelerated junctional or idioventricular rhythm.  Upgrade to CRT device has been discussed but felt to be high-risk due to his multiple previous surgical procedures and increased risk of infection 3. AFib: CHADSVasc 4 (age, CHF, CAD, HTN).  Occasional episodes of RVR are seen, but overall rate is well controlled and I do not think we want to excessively suppress his underlying ventricular rhythm with a beta-blocker.  Currently paced 40% of the time, will try to keep this balance between excessive ventricular pacing and RVR.Marland Kitchen 4. CAD: Ever complains of angina pectoris. Scar of previous inferior wall myocardial infarction, currently free of angina pectoris. CT chest documents atherosclerosis in the distribution of all 3 coronary arteries.  He prefers not to undergo invasive angiography. 5. HTN: Fair control 6. PPM: Normal device function.  Remote download in 3 months and office visit in 6 months 7. VT: None has been recorded since he was started on treatment with amiodarone in 2014 for an episode of near syncope and sustained monomorphic VT with a fairly slow cycle length of 370 ms (inferior scar VT?).  Amiodarone discontinued due to side effects. 8. Anticoagulation: INR usually in desirable range, no serious bleeding problems 9. RAS: Stable renal function, controlled blood pressure 10. Carotid stenosis: Rechecked 12/2015, mild progression. Recommend we reevaluate by [place], but patient declined. 11. R pleural effusion: Exudative effusion of unclear etiology, s/p pleurodesis, 2017. 12. HLP: Does not tolerate  more potent statins.    Medication Adjustments/Labs and Tests Ordered: Current medicines are reviewed at length with the patient today.  Concerns regarding medicines are outlined above.  Medication changes, Labs and Tests ordered today are listed in the Patient Instructions below. Patient Instructions  Dr Sallyanne Kuster recommends that you continue on your current medications as directed. Please refer to the Current Medication list given to you today.  Remote monitoring is used to monitor your Pacemaker or ICD from home. This monitoring reduces the number of office visits required to check your device to one time per year. It allows [place] to keep an eye on the functioning of your device to ensure it is working properly. You are scheduled for a device check from home on Wednesday, May 22nd, 2019. You may send your transmission at any time that day. If you have a wireless device, the transmission will be sent automatically. After your physician reviews your transmission, you will receive a notification with your next transmission date.  Dr Sallyanne Kuster recommends that you schedule a follow-up appointment in 12 months with a pacemaker check. You will receive a reminder letter in the mail two months in advance. If you don't receive a letter, please call our office to schedule the follow-up appointment.  If you need a refill on your cardiac medications before your next appointment, please call your pharmacy.    Signed, Sanda Klein, MD  12/19/2017 1:09 PM    [address], [place], [place]  [address] Phone: [phone]; Fax: [phone]

## 2017-12-19 NOTE — Patient Instructions (Signed)

## 2017-12-30 ENCOUNTER — Other Ambulatory Visit: Payer: Self-pay | Admitting: *Deleted

## 2017-12-30 DIAGNOSIS — J90 Pleural effusion, not elsewhere classified: Secondary | ICD-10-CM

## 2018-01-02 ENCOUNTER — Other Ambulatory Visit: Payer: Self-pay

## 2018-01-02 MED ORDER — TRAZODONE HCL 50 MG PO TABS: 25.0000 mg | ORAL_TABLET | Freq: Every evening | ORAL | 2 refills | Status: DC | PRN

## 2018-01-02 NOTE — Telephone Encounter (Signed)
Received refill request from [facility] on [address] for trazodone.

## 2018-01-12 ENCOUNTER — Encounter: Payer: Self-pay | Admitting: Nurse Practitioner

## 2018-01-12 ENCOUNTER — Ambulatory Visit (INDEPENDENT_AMBULATORY_CARE_PROVIDER_SITE_OTHER): Payer: PPO | Admitting: Nurse Practitioner

## 2018-01-12 ENCOUNTER — Ambulatory Visit (INDEPENDENT_AMBULATORY_CARE_PROVIDER_SITE_OTHER)
Admission: RE | Admit: 2018-01-12 | Discharge: 2018-01-12 | Disposition: A | Discharge status: Home / Self Care | Payer: PPO | Source: Ambulatory Visit | Attending: Nurse Practitioner | Admitting: Nurse Practitioner

## 2018-01-12 VITALS — BP 122/78 | HR 68 | Temp 98.0°F | Resp 16 | Ht 69.0 in | Wt 217.0 lb

## 2018-01-12 DIAGNOSIS — R059 Cough, unspecified: Secondary | ICD-10-CM

## 2018-01-12 DIAGNOSIS — R05 Cough: Secondary | ICD-10-CM | POA: Diagnosis not present

## 2018-01-12 DIAGNOSIS — H109 Unspecified conjunctivitis: Secondary | ICD-10-CM

## 2018-01-12 MED ORDER — ERYTHROMYCIN 5 MG/GM OP OINT: 1.0000 "application " | TOPICAL_OINTMENT | Freq: Every day | OPHTHALMIC | 0 refills | Status: DC

## 2018-01-12 NOTE — Progress Notes (Signed)
Name: Benjamin Mckay   MRN: [id number]    DOB: 01-28-1946   Date:01/12/2018       Progress Note  Subjective  Chief Complaint  Chief Complaint  Patient presents with  . Cough    states he has been sick since thursday, started with eyes being runny and glued shut and now has bad cough that he states he is coughing up thick white mucus.     HPI  Cough/cold symptoms- His symptoms began on this past Thursday after coming home from a cruise- initially watery eyes then with cough on Saturday. He says his eyes are matted shut with thick white drainage in the morning He is coughing up "white stuff" He says that his grandson was on the cruise with him and also had red eyes for several days  He reports malaise, body aches, dry mouth, sneezing, postnasal drip. He denies headaches, chest pain, shortness of breath, sore throat, nausea, vomiting, abdominal pain. He has been using visine drops in his eyes and tessalon perles, tylenol for cough and body aches but does not feel much better.  Patient Active Problem List   Diagnosis Date Noted  . Medicare annual wellness visit, subsequent 07/21/2017  . Routine adult health maintenance 07/21/2017  . Pleural effusion, malignant 10/02/2016  . Rotator cuff tendinitis 09/10/2016  . VT (ventricular tachycardia) ([place]) 06/12/2016  . 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 Insomnia 03/08/2016  . Solitary pulmonary nodule 01/09/2016  . Hyperlipidemia 12/12/2015  . History of sustained ventricular tachycardia 01/30/2015  . CHF (congestive heart failure) ([place]) 12/18/2014  . Warfarin anticoagulation 12/08/2014  . ARF (acute renal failure) ([place]) 12/07/2014  . Cardiomyopathy, ischemic 11/07/2013  . CHB (complete heart block) ([place]) 11/06/2013  . Single chamber St. Jude pacemaker 2013 11/06/2013  . Chronic atrial fibrillation ([place]) 01/29/2013  . HTN (hypertension) 02/01/2012  . Carotid stenosis 02/01/2012  . Renal artery stenosis ([place]) 02/01/2012  .  CAD (coronary artery disease) 02/01/2012  . Esophageal reflux 08/21/2011  . Hemorrhage of rectum and anus 04/26/2011  . HERNIA, UMBILICAL 78/29/5621  . UNSPECIFIED CARDIAC DYSRHYTHMIA 11/11/2007  . Transient cerebral ischemia 11/11/2007  . ESOPHAGEAL STRICTURE 11/11/2007  . DIVERTICULOSIS OF COLON 11/11/2007  . Non-Hodgkin's lymphoma of intestine ([place]) 09/03/2007    Social History   Tobacco Use  . Smoking status: Never Smoker  . Smokeless tobacco: Never Used  Substance Use Topics  . Alcohol use: No    Alcohol/week: 0.0 oz     Current Outpatient Medications:  .  acetaminophen (TYLENOL) 500 MG tablet, Take 1,000 mg by mouth every 6 (six) hours as needed for moderate pain. , Disp: , Rfl:  .  furosemide (LASIX) 20 MG tablet, TAKE 1 TO 2 TABLETS BY MOUTH DAILY AS DIRECTED, Disp: 180 tablet, Rfl: 1 .  metoprolol succinate (TOPROL-XL) 25 MG 24 hr tablet, Take 1 tablet (25 mg total) by mouth every evening. (Patient taking differently: Take 25 mg by mouth 2 (two) times daily. ), Disp: 90 tablet, Rfl: 3 .  pravastatin (PRAVACHOL) 80 MG tablet, Take 1 tablet (80 mg total) by mouth daily., Disp: 90 tablet, Rfl: 3 .  Tamsulosin HCl (FLOMAX) 0.4 MG CAPS, Take 0.4 mg by mouth at bedtime. , Disp: , Rfl:  .  traZODone (DESYREL) 50 MG tablet, Take 0.5-1 tablets (25-50 mg total) by mouth at bedtime as needed for sleep., Disp: 30 tablet, Rfl: 2 .  warfarin (COUMADIN) 2.5 MG tablet, Take 1 to 1 and 1/2 tablets by mouth daily as directed  by coumadin clinic, Disp: 135 tablet, Rfl: 1  Allergies  Allergen Reactions  . Statins Other (See Comments)    Leg cramps.   Tolerates pravastatin.    . Adhesive [Tape] Other (See Comments)    Skin irritation - please use paper tape  . Latex Other (See Comments)    Skin irritation  . Penicillins Rash    Has patient had a PCN reaction causing immediate rash, facial/tongue/throat swelling, SOB or lightheadedness with hypotension: Yes Has patient had a PCN reaction  causing severe rash involving mucus membranes or skin necrosis: No Has patient had a PCN reaction that required hospitalization No Has patient had a PCN reaction occurring within the last 10 years: No If all of the above answers are "NO", then may proceed with Cephalosporin use.    ROS  No other specific complaints in a complete review of systems (except as listed in HPI above).  Objective  Vitals:   01/12/18 1102  BP: 122/78  Pulse: 68  Resp: 16  Temp: 98 F (36.7 C)  TempSrc: Oral  SpO2: 97%  Weight: 217 lb (98.4 kg)  Height: 5\' 9"  (1.753 m)   Body mass index is 32.05 kg/m.  Nursing Note and Vital Signs reviewed.  Physical Exam Constitutional: Patient appears well-developed and well-nourished. No distress.  HEENT: head atraumatic, normocephalic, pupils equal and reactive to light, EOM's intact, conjunctiva injected with watery drainage bilaterally, TM's without erythema or bulging, neck supple without lymphadenopathy, oropharynx pink and moist without exudate Cardiovascular: Normal rate, regular rhythm, S1/S2 present.   Pulmonary/Chest: Effort normal. Decreased breath sounds RUL, RLL. No respiratory distress or retractions.  Psychiatric: Patient has a normal mood and affect. behavior is normal. Judgment and thought content normal.  Assessment & Plan -Red flags and when to present for emergency care or RTC including fever >101.78F, chest pain, shortness of breath, new/worsening/un-resolving symptoms, reviewed with patient at time of visit. Follow up and care instructions discussed and provided in AVS.  1. Bacterial conjunctivitis Home care and return precautions discussed and printed in AVS - erythromycin ophthalmic ointment; Place 1 application into both eyes at bedtime. For 7 days  Dispense: 3.5 g; Refill: 0  2. Cough We discussed likely viral at this point but will obtain CXR due to diminished breath sounds on PE today Home care and return precautions discussed and  printed in AVS - DG Chest 2 View; Future

## 2018-01-12 NOTE — Patient Instructions (Addendum)
Please head downstairs for chest x-rays. If any of your test results are critically abnormal, you will be contacted right away. Your results may be released to your MyChart for viewing before I am able to provide you with my response. I will contact you within a week about your test results and any recommendations for abnormalities.  For your eyes, I have sent an antibiotic ointment erythromycin-please apply a thin ribbon to your inner lower eyelid each night before bed for 7 nights.  For your cough, you may try delsym over the counter.For drainage may use Flonase nasal spray and/or Allegra, Claritin or Zyrtec. Please rest and stay hydrated.   Please follow up if your symptoms are not improving by Thursday.  It was good to see you. I hope you feel better!      Bacterial Conjunctivitis Bacterial conjunctivitis is an infection of your conjunctiva. This is the clear membrane that covers the white part of your eye and the inner surface of your eyelid. This condition can make your eye:  Red or pink.  Itchy.  This condition is caused by bacteria. This condition spreads very easily from person to person (is contagious) and from one eye to the other eye. Follow these instructions at home: Medicines  Take or apply your antibiotic medicine as told by your doctor. Do not stop taking or applying the antibiotic even if you start to feel better.  Take or apply over-the-counter and prescription medicines only as told by your doctor.  Do not touch your eyelid with the eye drop bottle or the ointment tube. Managing discomfort  Wipe any fluid from your eye with a warm, wet washcloth or a cotton ball.  Place a cool, clean washcloth on your eye. Do this for 10-20 minutes, 3-4 times per day. General instructions  Do not wear contact lenses until the irritation is gone. Wear glasses until your doctor says it is okay to wear contacts.  Do not wear eye makeup until your symptoms are gone. Throw  away any old makeup.  Change or wash your pillowcase every day.  Do not share towels or washcloths with anyone.  Wash your hands often with soap and water. Use paper towels to dry your hands.  Do not touch or rub your eyes.  Do not drive or use heavy machinery if your vision is blurry. Contact a doctor if:  You have a fever.  Your symptoms do not get better after 10 days. Get help right away if:  You have a fever and your symptoms suddenly get worse.  You have very bad pain when you move your eye.  Your face: ? Hurts. ? Is red. ? Is swollen.  You have sudden loss of vision. This information is not intended to replace advice given to you by your health care provider. Make sure you discuss any questions you have with your health care provider. Document Released: 07/09/2008 Document Revised: 03/07/2016 Document Reviewed: 07/13/2015 Elsevier Interactive Patient Education  2018 [place].   Upper Respiratory Infection, Adult Most upper respiratory infections (URIs) are caused by a virus. A URI affects the nose, throat, and upper air passages. The most common type of URI is often called "the common cold." Follow these instructions at home:  Take medicines only as told by your doctor.  Gargle warm saltwater or take cough drops to comfort your throat as told by your doctor.  Use a warm mist humidifier or inhale steam from a shower to increase air moisture. This may  make it easier to breathe.  Drink enough fluid to keep your pee (urine) clear or pale yellow.  Eat soups and other clear broths.  Have a healthy diet.  Rest as needed.  Go back to work when your fever is gone or your doctor says it is okay. ? You may need to stay home longer to avoid giving your URI to others. ? You can also wear a face mask and wash your hands often to prevent spread of the virus.  Use your inhaler more if you have asthma.  Do not use any tobacco products, including cigarettes, chewing  tobacco, or electronic cigarettes. If you need help quitting, ask your doctor. Contact a doctor if:  You are getting worse, not better.  Your symptoms are not helped by medicine.  You have chills.  You are getting more short of breath.  You have brown or red mucus.  You have yellow or brown discharge from your nose.  You have pain in your face, especially when you bend forward.  You have a fever.  You have puffy (swollen) neck glands.  You have pain while swallowing.  You have white areas in the back of your throat. Get help right away if:  You have very bad or constant: ? Headache. ? Ear pain. ? Pain in your forehead, behind your eyes, and over your cheekbones (sinus pain). ? Chest pain.  You have long-lasting (chronic) lung disease and any of the following: ? Wheezing. ? Long-lasting cough. ? Coughing up blood. ? A change in your usual mucus.  You have a stiff neck.  You have changes in your: ? Vision. ? Hearing. ? Thinking. ? Mood. This information is not intended to replace advice given to you by your health care provider. Make sure you discuss any questions you have with your health care provider. Document Released: 03/18/2008 Document Revised: 06/02/2016 Document Reviewed: 01/05/2014 Elsevier Interactive Patient Education  2018 [employer].

## 2018-01-16 ENCOUNTER — Ambulatory Visit (INDEPENDENT_AMBULATORY_CARE_PROVIDER_SITE_OTHER): Payer: PPO | Admitting: Pharmacist Clinician (PhC)/ Clinical Pharmacy Specialist

## 2018-01-16 DIAGNOSIS — I482 Chronic atrial fibrillation, unspecified: Secondary | ICD-10-CM

## 2018-01-16 DIAGNOSIS — Z7901 Long term (current) use of anticoagulants: Secondary | ICD-10-CM

## 2018-01-16 LAB — POCT INR: INR: 2.8

## 2018-01-16 NOTE — Patient Instructions (Signed)
Description   Continue with 1.5 tablets every day.  Repeat INR in 4 weeks     

## 2018-01-17 ENCOUNTER — Encounter: Payer: Self-pay | Admitting: Family Medicine

## 2018-01-17 ENCOUNTER — Ambulatory Visit (INDEPENDENT_AMBULATORY_CARE_PROVIDER_SITE_OTHER): Payer: PPO | Admitting: Family Medicine

## 2018-01-17 ENCOUNTER — Telehealth: Payer: Self-pay | Admitting: Nurse Practitioner

## 2018-01-17 VITALS — BP 136/82 | HR 71 | Temp 97.9°F | Wt 211.0 lb

## 2018-01-17 DIAGNOSIS — J302 Other seasonal allergic rhinitis: Secondary | ICD-10-CM | POA: Diagnosis not present

## 2018-01-17 MED ORDER — FLUTICASONE PROPIONATE 50 MCG/ACT NA SUSP: 2.0000 | Freq: Every day | NASAL | 6 refills | Status: DC

## 2018-01-17 MED ORDER — CETIRIZINE HCL 10 MG PO TABS: 10.0000 mg | ORAL_TABLET | Freq: Every day | ORAL | 11 refills | Status: DC

## 2018-01-17 NOTE — Progress Notes (Signed)
   Subjective:    Patient ID: Benjamin Mckay, male    DOB: 1946/05/14, 72 y.o.   MRN: [id number]  HPI URI- pt was dx'd w/ viral URI on 4/1 after returning from cruise the week before.  Pt reports he was told to come back if no better.  Pt reports bloody nasal drainage in the AM when he blows his nose.  Continues to have coughing jags.  Had 2 days of diarrhea- Thursday and Friday but this has resolved.  Denies HA.  Mild sinus pressure.  No ear pain.  No tooth pain.  Not taking allergy medication.  + PND.   Review of Systems For ROS see HPI     Objective:   Physical Exam  Constitutional: He appears well-developed and well-nourished. No distress.  HENT:  Head: Normocephalic and atraumatic.  No TTP over sinuses + turbinate edema + PND TMs normal bilaterally  Eyes: Pupils are equal, round, and reactive to light. Conjunctivae and EOM are normal.  Neck: Normal range of motion. Neck supple.  Cardiovascular: Normal rate, regular rhythm and normal heart sounds.  Pulmonary/Chest: Effort normal. No respiratory distress. He has no wheezes.  Decreased BS over R lung base- reviewed xray, pt has scarring present  Lymphadenopathy:    He has no cervical adenopathy.  Skin: Skin is warm and dry.  Vitals reviewed.         Assessment & Plan:  Allergic rhinitis- pt was dx'd w/ viral illness on 4/1 and his sxs are still consistent w/ this, as well as an allergy component.  Start daily Zyrtec.  Add Flonase.  Cough meds prn.  Reviewed supportive care and red flags that should prompt return.  Pt expressed understanding and is in agreement w/ plan.

## 2018-01-17 NOTE — Telephone Encounter (Signed)
Patient has called into [facility] stating URI he was seen for on 4/1 has gotten worse.  States he has started spitting up blood and blood in his nasal discharge.  Patient was education on what signs to look out for to go to the ED. States he is having diarrhea and no fever. [facility] suggested patient to be seen in office today.  Patient is scheduled for Sat clinic.

## 2018-01-17 NOTE — Patient Instructions (Signed)
Follow up as needed or as scheduled START the daily Cetirizine (Zyrtec) to help w/ the allergy congestion and drainage ADD the Flonase- 2 sprays each nostril- daily Drink plenty of fluids Delsym or Robitussin as needed for cough REST! Call with any questions or concerns Hang in there!!

## 2018-02-04 ENCOUNTER — Encounter: Payer: PPO | Admitting: Cardiothoracic Surgery

## 2018-02-04 ENCOUNTER — Ambulatory Visit
Admission: RE | Admit: 2018-02-04 | Discharge: 2018-02-04 | Disposition: A | Discharge status: Home / Self Care | Payer: PPO | Source: Ambulatory Visit | Attending: Cardiothoracic Surgery | Admitting: Cardiothoracic Surgery

## 2018-02-04 DIAGNOSIS — J90 Pleural effusion, not elsewhere classified: Secondary | ICD-10-CM | POA: Diagnosis not present

## 2018-02-11 ENCOUNTER — Encounter: Payer: Self-pay | Admitting: Cardiothoracic Surgery

## 2018-02-11 ENCOUNTER — Ambulatory Visit: Payer: PPO | Admitting: Cardiothoracic Surgery

## 2018-02-11 ENCOUNTER — Other Ambulatory Visit: Payer: Self-pay

## 2018-02-11 VITALS — BP 128/77 | HR 82 | Resp 16 | Ht 69.0 in | Wt 215.0 lb

## 2018-02-11 DIAGNOSIS — I712 Thoracic aortic aneurysm, without rupture, unspecified: Secondary | ICD-10-CM

## 2018-02-11 DIAGNOSIS — R911 Solitary pulmonary nodule: Secondary | ICD-10-CM

## 2018-02-11 DIAGNOSIS — Z09 Encounter for follow-up examination after completed treatment for conditions other than malignant neoplasm: Secondary | ICD-10-CM | POA: Diagnosis not present

## 2018-02-11 DIAGNOSIS — J90 Pleural effusion, not elsewhere classified: Secondary | ICD-10-CM | POA: Diagnosis not present

## 2018-02-11 NOTE — Progress Notes (Signed)
PCP is Lance Sell, NP Referring Provider is Juanito Doom, MD  Chief Complaint  Patient presents with  . Routine Post Op    9 month f/u with CT CHEST to f/u on pulmonary status and chronic pleural thickening/atelectasis/loculated pl. eff.   Schedule 9-month follow-up CT scan of chest HPI: 72 year old male with multiple medical-cardiac problems had right VATS 2017 for drainage of a loculated benign pleural effusion with pleurodesis probably related to chronic diastolic heart failure.  Patient had significant pleural thickening negative for neoplasm.  Patient's cough and shortness of breath has been significantly improved since then.  His cardiac problems are being treated by Dr. Debby Bud. He has not been recently hospitalized.  He did have a allergic sinusitis with cough a month ago treated by his primary care physician with resolution.  He still has some irritation-conjunctivitis of his right eye.  CT scan today shows minimal right pleural effusion with stable pleural thickening. Past Medical History:  Diagnosis Date  . Anxiety   . Atrial fibrillation ([place])   . Cardiomyopathy, ischemic 11/07/2013  . Cataract   . CHB (complete heart block) ([place]) 11/06/2013  . Chronic combined systolic and diastolic CHF (congestive heart failure) ([place])   . Colon polyps   . Colon polyps   . Congenital heart block   . Constipation   . CVA (cerebral infarction)   . Depression   . Diverticulosis   . Esophageal stricture   . Family history of adverse reaction to anesthesia   . Hyperlipidemia   . Hypertension   . Hyperthyroidism 11/16/2015  . Lymphoma ([place]) 1998   of colon   . Presence of permanent cardiac pacemaker    [employer]. Jude  pt.states he is totally dependent on pacemaker  . Shortness of breath dyspnea    Pulmonary Effusion  . Stroke [facility])    [id number] TIA  . Umbilical hernia   . Urinary frequency     Past Surgical History:  Procedure Laterality Date  . APPENDECTOMY  1953  .  CARDIAC CATHETERIZATION  01/06/2003   Recommend medical therapy  . CARDIOVASCULAR STRESS TEST  07/16/2012   Mild-moderate perfusion defect seen in Basal inferior, Mid inferior, and Apica lateral consistent with infarct/scar. No scintigraphic evidence for inducible myocardial ischemia. No ECG changes. EKG negative for ischemia.  Marland Kitchen CAROTID DOPPLER  11/04/2012   Proximal Rt ICA 50-99% diameter reduction; Lft Bulb demonstrated mild amount homogeneous plaque-not hemodynamically significant; Lft ICA-normal patency.  . CHEST TUBE INSERTION Right 03/15/2016   Procedure: INSERTION PLEURAL DRAINAGE CATHETER;  Surgeon: Ivin Poot, MD;  Location: [place];  Service: Thoracic;  Laterality: Right;  . COLECTOMY     for lymphoma  . COLONOSCOPY    . neck fusion    . PACEMAKER GENERATOR CHANGE  01/31/2012   St Jude Med Accent DR RF model [id number] serial [id number]  . PACEMAKER PLACEMENT     replaced 3 x  . PERMANENT PACEMAKER GENERATOR CHANGE N/A 01/31/2012   Procedure: PERMANENT PACEMAKER GENERATOR CHANGE;  Surgeon: Sanda Klein, MD;  Location: [place] CATH LAB;   . PLEURADESIS N/A 04/02/2016   Procedure: PLEURADESIS;  Surgeon: Ivin Poot, MD;  Location: [place];  Service: Thoracic;  Laterality: N/A;  . REMOVAL OF PLEURAL DRAINAGE CATHETER Right 07/26/2016   Procedure: REMOVAL OF PLEURAL DRAINAGE CATHETER;  Surgeon: Ivin Poot, MD;  Location: [place];  Service: Thoracic;  Laterality: Right;  . TONSILLECTOMY    . TRANSTHORACIC ECHOCARDIOGRAM  11/04/2012   EF 35-40%,  systolic function moderately reduced, mild regurg of the aortic and mitral valves.  Marland Kitchen VIDEO ASSISTED THORACOSCOPY Right 04/02/2016   Procedure: Right VIDEO ASSISTED THORACOSCOPY with Biopsies and drainage pleural effusion;  Surgeon: Ivin Poot, MD;  Location[facility];  Service: Thoracic;  Laterality: Right;    Family History  Problem Relation Age of Onset  . Prostate cancer Father   . Heart disease Mother   . Stroke Mother   . Diabetes Maternal  Grandmother   . Colon cancer Neg Hx   . Heart attack Neg Hx     Social History Social History   Tobacco Use  . Smoking status: Never Smoker  . Smokeless tobacco: Never Used  Substance Use Topics  . Alcohol use: No    Alcohol/week: 0.0 oz  . Drug use: No    Current Outpatient Medications  Medication Sig Dispense Refill  . acetaminophen (TYLENOL) 500 MG tablet Take 1,000 mg by mouth every 6 (six) hours as needed for moderate pain.     . cetirizine (ZYRTEC) 10 MG tablet Take 1 tablet (10 mg total) by mouth daily. 30 tablet 11  . erythromycin ophthalmic ointment Place 1 application into both eyes at bedtime. For 7 days 3.5 g 0  . fluticasone (FLONASE) 50 MCG/ACT nasal spray Place 2 sprays into both nostrils daily. 16 g 6  . furosemide (LASIX) 20 MG tablet TAKE 1 TO 2 TABLETS BY MOUTH DAILY AS DIRECTED 180 tablet 1  . metoprolol succinate (TOPROL-XL) 25 MG 24 hr tablet Take 1 tablet (25 mg total) by mouth every evening. (Patient taking differently: Take 25 mg by mouth 2 (two) times daily. ) 90 tablet 3  . pravastatin (PRAVACHOL) 80 MG tablet Take 1 tablet (80 mg total) by mouth daily. 90 tablet 3  . Tamsulosin HCl (FLOMAX) 0.4 MG CAPS Take 0.4 mg by mouth at bedtime.     . traZODone (DESYREL) 50 MG tablet Take 0.5-1 tablets (25-50 mg total) by mouth at bedtime as needed for sleep. 30 tablet 2  . warfarin (COUMADIN) 2.5 MG tablet Take 1 to 1 and 1/2 tablets by mouth daily as directed by coumadin clinic 135 tablet 1   No current facility-administered medications for this visit.     Allergies  Allergen Reactions  . Statins Other (See Comments)    Leg cramps.   Tolerates pravastatin.    . Adhesive [Tape] Other (See Comments)    Skin irritation - please use paper tape  . Latex Other (See Comments)    Skin irritation  . Penicillins Rash    Has patient had a PCN reaction causing immediate rash, facial/tongue/throat swelling, SOB or lightheadedness with hypotension: Yes Has patient had  a PCN reaction causing severe rash involving mucus membranes or skin necrosis: No Has patient had a PCN reaction that required hospitalization No Has patient had a PCN reaction occurring within the last 10 years: No If all of the above answers are "NO", then may proceed with Cephalosporin use.    Review of Systems  Weight stable Chronic cough remains minimal No hemoptysis No postthoracotomy pain Pacemaker functioning well No syncope seizure or falls No bleeding complications from his anticoagulation  BP 128/77 (BP Location: Right Arm, Patient Position: Sitting, Cuff Size: Large)   Pulse 82   Resp 16   Ht 5\' 9"  (1.753 m)   Wt 215 lb (97.5 kg)   SpO2 98% Comment: ON RA  BMI 31.75 kg/m  Physical Exam      Exam  General- alert and comfortable    Neck- no JVD, no cervical adenopathy palpable, no carotid bruit   Lungs-breath sounds slightly diminished on the right side but otherwise clear without rales, wheezes.  Well-healed right VATS incision.   Cor- regular rate and rhythm, no murmur , gallop   Abdomen- soft, non-tender   Extremities - warm, non-tender, minimal edema   Neuro- oriented, appropriate, no focal weakness   Diagnostic Tests: CT scan images personally reviewed and counseled with patient showing stable right base pleural thickening with minimal loculated effusion  Impression: No evidence of recurrent significant effusion, no evidence of change in pleural thickening.  Right base.  Plan: Return with CT scan in 1 year.   Len Childs, MD [facility] [phone]

## 2018-02-13 ENCOUNTER — Ambulatory Visit (INDEPENDENT_AMBULATORY_CARE_PROVIDER_SITE_OTHER): Payer: PPO | Admitting: Pharmacist Clinician (PhC)/ Clinical Pharmacy Specialist

## 2018-02-13 DIAGNOSIS — I482 Chronic atrial fibrillation, unspecified: Secondary | ICD-10-CM

## 2018-02-13 DIAGNOSIS — Z7901 Long term (current) use of anticoagulants: Secondary | ICD-10-CM | POA: Diagnosis not present

## 2018-02-13 LAB — POCT INR: INR: 3.2

## 2018-02-13 NOTE — Patient Instructions (Signed)
Description   Take only 1 tablet today Friday May 3, then continue with 1.5 tablets every day.  Repeat INR in 4 weeks

## 2018-02-21 ENCOUNTER — Other Ambulatory Visit: Payer: Self-pay | Admitting: Cardiovascular Disease

## 2018-03-04 ENCOUNTER — Ambulatory Visit (INDEPENDENT_AMBULATORY_CARE_PROVIDER_SITE_OTHER): Payer: PPO | Admitting: *Deleted

## 2018-03-04 DIAGNOSIS — I472 Ventricular tachycardia, unspecified: Secondary | ICD-10-CM

## 2018-03-04 DIAGNOSIS — I4891 Unspecified atrial fibrillation: Secondary | ICD-10-CM

## 2018-03-04 NOTE — Progress Notes (Signed)
Remote pacemaker transmission.   

## 2018-03-05 DIAGNOSIS — L708 Other acne: Secondary | ICD-10-CM | POA: Diagnosis not present

## 2018-03-05 DIAGNOSIS — L570 Actinic keratosis: Secondary | ICD-10-CM | POA: Diagnosis not present

## 2018-03-10 ENCOUNTER — Other Ambulatory Visit: Payer: Self-pay | Admitting: Internal Medicine

## 2018-03-10 DIAGNOSIS — I5023 Acute on chronic systolic (congestive) heart failure: Secondary | ICD-10-CM

## 2018-03-13 ENCOUNTER — Ambulatory Visit (INDEPENDENT_AMBULATORY_CARE_PROVIDER_SITE_OTHER): Payer: PPO | Admitting: Pharmacist

## 2018-03-13 DIAGNOSIS — I482 Chronic atrial fibrillation, unspecified: Secondary | ICD-10-CM

## 2018-03-13 LAB — POCT INR: INR: 3 (ref 2.0–3.0)

## 2018-03-23 LAB — CUP PACEART REMOTE DEVICE CHECK
Battery Remaining Percentage: 95.5 %
Battery Voltage: 2.96 V
Implantable Lead Location: 753860
Implantable Pulse Generator Implant Date: 20130419
Lead Channel Pacing Threshold Amplitude: 0.75 V
Lead Channel Pacing Threshold Pulse Width: 0.5 ms
Lead Channel Setting Pacing Pulse Width: 0.5 ms
MDC IDC LEAD IMPLANT DT: 20130419
MDC IDC MSMT BATTERY REMAINING LONGEVITY: 118 mo
MDC IDC MSMT LEADCHNL RV IMPEDANCE VALUE: 450 Ohm
MDC IDC MSMT LEADCHNL RV SENSING INTR AMPL: 9.9 mV
MDC IDC PG SERIAL: 7313697
MDC IDC SESS DTM: 20190522073806
MDC IDC SET LEADCHNL RV PACING AMPLITUDE: 2.5 V
MDC IDC SET LEADCHNL RV SENSING SENSITIVITY: 2.5 mV
MDC IDC STAT BRADY RV PERCENT PACED: 43 %

## 2018-04-24 ENCOUNTER — Ambulatory Visit (INDEPENDENT_AMBULATORY_CARE_PROVIDER_SITE_OTHER): Payer: PPO | Admitting: Pharmacist

## 2018-04-24 DIAGNOSIS — I482 Chronic atrial fibrillation, unspecified: Secondary | ICD-10-CM

## 2018-04-24 DIAGNOSIS — Z7901 Long term (current) use of anticoagulants: Secondary | ICD-10-CM | POA: Diagnosis not present

## 2018-04-24 LAB — POCT INR: INR: 2.9 (ref 2.0–3.0)

## 2018-06-03 ENCOUNTER — Ambulatory Visit (INDEPENDENT_AMBULATORY_CARE_PROVIDER_SITE_OTHER): Payer: PPO | Admitting: *Deleted

## 2018-06-03 DIAGNOSIS — I482 Chronic atrial fibrillation, unspecified: Secondary | ICD-10-CM

## 2018-06-03 DIAGNOSIS — I442 Atrioventricular block, complete: Secondary | ICD-10-CM

## 2018-06-03 NOTE — Progress Notes (Signed)
Remote pacemaker transmission.   

## 2018-06-05 ENCOUNTER — Encounter: Payer: Self-pay | Admitting: Cardiology

## 2018-06-10 ENCOUNTER — Ambulatory Visit (INDEPENDENT_AMBULATORY_CARE_PROVIDER_SITE_OTHER): Payer: PPO | Admitting: Pharmacist Clinician (PhC)/ Clinical Pharmacy Specialist

## 2018-06-10 DIAGNOSIS — Z7901 Long term (current) use of anticoagulants: Secondary | ICD-10-CM | POA: Diagnosis not present

## 2018-06-10 DIAGNOSIS — I482 Chronic atrial fibrillation, unspecified: Secondary | ICD-10-CM

## 2018-06-10 LAB — POCT INR: INR: 5.1 — AB (ref 2.0–3.0)

## 2018-06-10 MED ORDER — WARFARIN SODIUM 2.5 MG PO TABS: ORAL_TABLET | ORAL | 1 refills | Status: DC

## 2018-06-10 NOTE — Patient Instructions (Signed)
Description   No warfarin for 2 days, then continue with 1.5 tablets every day.  Repeat INR in 2 weeks

## 2018-06-11 ENCOUNTER — Other Ambulatory Visit: Payer: Self-pay | Admitting: Nurse Practitioner

## 2018-06-24 ENCOUNTER — Ambulatory Visit (INDEPENDENT_AMBULATORY_CARE_PROVIDER_SITE_OTHER): Payer: PPO | Admitting: Pharmacist

## 2018-06-24 DIAGNOSIS — I482 Chronic atrial fibrillation, unspecified: Secondary | ICD-10-CM

## 2018-06-24 LAB — POCT INR: INR: 2.9 (ref 2.0–3.0)

## 2018-07-07 LAB — CUP PACEART REMOTE DEVICE CHECK
Battery Remaining Longevity: 116 mo
Battery Remaining Percentage: 95.5 %
Battery Voltage: 2.96 V
Date Time Interrogation Session: 20190821060903
Implantable Lead Implant Date: 20130419
Implantable Lead Location: 753860
Implantable Pulse Generator Implant Date: 20130419
Lead Channel Impedance Value: 430 Ohm
Lead Channel Pacing Threshold Amplitude: 0.75 V
Lead Channel Pacing Threshold Pulse Width: 0.5 ms
Lead Channel Sensing Intrinsic Amplitude: 10.2 mV
Lead Channel Setting Pacing Pulse Width: 0.5 ms
MDC IDC SET LEADCHNL RV PACING AMPLITUDE: 2.5 V
MDC IDC SET LEADCHNL RV SENSING SENSITIVITY: 2.5 mV
MDC IDC STAT BRADY RV PERCENT PACED: 43 %
Pulse Gen Serial Number: 7313697

## 2018-07-22 ENCOUNTER — Ambulatory Visit (INDEPENDENT_AMBULATORY_CARE_PROVIDER_SITE_OTHER): Payer: PPO | Admitting: Pharmacist Clinician (PhC)/ Clinical Pharmacy Specialist

## 2018-07-22 DIAGNOSIS — Z7901 Long term (current) use of anticoagulants: Secondary | ICD-10-CM

## 2018-07-22 DIAGNOSIS — I482 Chronic atrial fibrillation, unspecified: Secondary | ICD-10-CM | POA: Diagnosis not present

## 2018-07-22 LAB — POCT INR: INR: 2.6 (ref 2.0–3.0)

## 2018-07-22 NOTE — Patient Instructions (Signed)
Description   Continue with 1.5 tablets every day.  Repeat INR in 4 weeks     

## 2018-07-23 ENCOUNTER — Ambulatory Visit (INDEPENDENT_AMBULATORY_CARE_PROVIDER_SITE_OTHER): Payer: PPO | Admitting: Nurse Practitioner

## 2018-07-23 ENCOUNTER — Encounter: Payer: Self-pay | Admitting: Nurse Practitioner

## 2018-07-23 ENCOUNTER — Other Ambulatory Visit (INDEPENDENT_AMBULATORY_CARE_PROVIDER_SITE_OTHER): Payer: PPO

## 2018-07-23 VITALS — BP 138/90 | HR 83 | Ht 69.0 in | Wt 215.0 lb

## 2018-07-23 DIAGNOSIS — Z1159 Encounter for screening for other viral diseases: Secondary | ICD-10-CM | POA: Diagnosis not present

## 2018-07-23 DIAGNOSIS — I10 Essential (primary) hypertension: Secondary | ICD-10-CM

## 2018-07-23 DIAGNOSIS — Z9189 Other specified personal risk factors, not elsewhere classified: Secondary | ICD-10-CM | POA: Diagnosis not present

## 2018-07-23 DIAGNOSIS — R7309 Other abnormal glucose: Secondary | ICD-10-CM

## 2018-07-23 DIAGNOSIS — R21 Rash and other nonspecific skin eruption: Secondary | ICD-10-CM | POA: Diagnosis not present

## 2018-07-23 DIAGNOSIS — G4700 Insomnia, unspecified: Secondary | ICD-10-CM | POA: Diagnosis not present

## 2018-07-23 DIAGNOSIS — E7800 Pure hypercholesterolemia, unspecified: Secondary | ICD-10-CM

## 2018-07-23 DIAGNOSIS — Z0000 Encounter for general adult medical examination without abnormal findings: Secondary | ICD-10-CM

## 2018-07-23 DIAGNOSIS — Z23 Encounter for immunization: Secondary | ICD-10-CM

## 2018-07-23 LAB — CBC
HEMATOCRIT: 48.1 % (ref 39.0–52.0)
HEMOGLOBIN: 16.8 g/dL (ref 13.0–17.0)
MCHC: 34.8 g/dL (ref 30.0–36.0)
MCV: 95.5 fl (ref 78.0–100.0)
Platelets: 166 10*3/uL (ref 150.0–400.0)
RBC: 5.04 Mil/uL (ref 4.22–5.81)
RDW: 13 % (ref 11.5–15.5)
WBC: 7 10*3/uL (ref 4.0–10.5)

## 2018-07-23 LAB — HEMOGLOBIN A1C: Hgb A1c MFr Bld: 5.6 % (ref 4.6–6.5)

## 2018-07-23 LAB — COMPREHENSIVE METABOLIC PANEL
ALBUMIN: 3.7 g/dL (ref 3.5–5.2)
ALK PHOS: 85 U/L (ref 39–117)
ALT: 9 U/L (ref 0–53)
AST: 13 U/L (ref 0–37)
BUN: 11 mg/dL (ref 6–23)
CO2: 29 mEq/L (ref 19–32)
Calcium: 8.7 mg/dL (ref 8.4–10.5)
Chloride: 104 mEq/L (ref 96–112)
Creatinine, Ser: 1.02 mg/dL (ref 0.40–1.50)
GFR: 76.18 mL/min (ref >60.00)
Glucose, Bld: 100 mg/dL — ABNORMAL HIGH (ref 70–99)
Potassium: 3.9 mEq/L (ref 3.5–5.1)
Sodium: 140 mEq/L (ref 135–145)
Total Bilirubin: 1.1 mg/dL (ref 0.2–1.2)
Total Protein: 7 g/dL (ref 6.0–8.3)

## 2018-07-23 LAB — LIPID PANEL
Cholesterol: 182 mg/dL (ref 0–200)
HDL: 38.6 mg/dL — AB (ref >39.00)
LDL Cholesterol: 130 mg/dL — ABNORMAL HIGH (ref 0–99)
NONHDL: 143.73
Total CHOL/HDL Ratio: 5
Triglycerides: 67 mg/dL (ref 0.0–149.0)
VLDL: 13.4 mg/dL (ref 0.0–40.0)

## 2018-07-23 NOTE — Assessment & Plan Note (Signed)
Statin rx  by cardiology No recent lipid panel, will update with other labs today, he is fasting F/U with further recommendations pending lab results - Lipid panel; Future - CBC; Future - Comprehensive metabolic panel; Future

## 2018-07-23 NOTE — Assessment & Plan Note (Signed)
Routine general medical examination at a health care facility Reviewed annual screening exams, healthy lifestyle, additional information provided on AVS Recommend AWV with JIll - Lipid panel; Future - CBC; Future - Comprehensive metabolic panel; Future - Hemoglobin A1c; Future - Hepatitis C antibody; Future  Need for influenza vaccination- Flu vaccine HIGH DOSE PF Encounter for hepatitis C virus screening test for high risk patient- Hepatitis C antibody; Future  Elevated glucose Noted on past CMETs Check A1c today - Hemoglobin A1c; Future

## 2018-07-23 NOTE — Patient Instructions (Addendum)
Please head downstairs for lab work/x-rays. If any of your test results are critically abnormal, you will be contacted right away. Otherwise, I will contact you as soon as possible with any changes or recommendations.  If you have medicare related insurance (such as traditional Medicare, [employer], [employer], or similar), Please make an appointment at the scheduling desk with Sharee Pimple, the [employer], for your Wellness visit in this office, which is a benefit with your insurance.   Health Maintenance, Male A healthy lifestyle and preventive care is important for your health and wellness. Ask your health care provider about what schedule of regular examinations is right for you. What should I know about weight and diet? Eat a Healthy Diet  Eat plenty of vegetables, fruits, whole grains, low-fat dairy products, and lean protein.  Do not eat a lot of foods high in solid fats, added sugars, or salt.  Maintain a Healthy Weight Regular exercise can help you achieve or maintain a healthy weight. You should:  Do at least 150 minutes of exercise each week. The exercise should increase your heart rate and make you sweat (moderate-intensity exercise).  Do strength-training exercises at least twice a week.  Watch Your Levels of Cholesterol and Blood Lipids  Have your blood tested for lipids and cholesterol every 5 years starting at 72 years of age. If you are at high risk for heart disease, you should start having your blood tested when you are 72 years old. You may need to have your cholesterol levels checked more often if: ? Your lipid or cholesterol levels are high. ? You are older than 72 years of age. ? You are at high risk for heart disease.  What should I know about cancer screening? Many types of cancers can be detected early and may often be prevented. Lung Cancer  You should be screened every year for lung cancer if: ? You are a current smoker who  has smoked for at least 30 years. ? You are a former smoker who has quit within the past 15 years.  Talk to your health care provider about your screening options, when you should start screening, and how often you should be screened.  Colorectal Cancer  Routine colorectal cancer screening usually begins at 72 years of age and should be repeated every 5-10 years until you are 72 years old. You may need to be screened more often if early forms of precancerous polyps or small growths are found. Your health care provider may recommend screening at an earlier age if you have risk factors for colon cancer.  Your health care provider may recommend using home test kits to check for hidden blood in the stool.  A small camera at the end of a tube can be used to examine your colon (sigmoidoscopy or colonoscopy). This checks for the earliest forms of colorectal cancer.  Prostate and Testicular Cancer  Depending on your age and overall health, your health care provider may do certain tests to screen for prostate and testicular cancer.  Talk to your health care provider about any symptoms or concerns you have about testicular or prostate cancer.  Skin Cancer  Check your skin from head to toe regularly.  Tell your health care provider about any new moles or changes in moles, especially if: ? There is a change in a mole's size, shape, or color. ? You have a mole that is larger than a pencil eraser.  Always use sunscreen. Apply sunscreen liberally  and repeat throughout the day.  Protect yourself by wearing long sleeves, pants, a wide-brimmed hat, and sunglasses when outside.  What should I know about heart disease, diabetes, and high blood pressure?  If you are 49-61 years of age, have your blood pressure checked every 3-5 years. If you are 64 years of age or older, have your blood pressure checked every year. You should have your blood pressure measured twice-once when you are at a hospital or  clinic, and once when you are not at a hospital or clinic. Record the average of the two measurements. To check your blood pressure when you are not at a hospital or clinic, you can use: ? An automated blood pressure machine at a pharmacy. ? A home blood pressure monitor.  Talk to your health care provider about your target blood pressure.  If you are between 32-59 years old, ask your health care provider if you should take aspirin to prevent heart disease.  Have regular diabetes screenings by checking your fasting blood sugar level. ? If you are at a normal weight and have a low risk for diabetes, have this test once every three years after the age of 72. ? If you are overweight and have a high risk for diabetes, consider being tested at a younger age or more often.  A one-time screening for abdominal aortic aneurysm (AAA) by ultrasound is recommended for men aged 57-75 years who are current or former smokers. What should I know about preventing infection? Hepatitis B If you have a higher risk for hepatitis B, you should be screened for this virus. Talk with your health care provider to find out if you are at risk for hepatitis B infection. Hepatitis C Blood testing is recommended for:  Everyone born from [birth year] through 1965.  Anyone with known risk factors for hepatitis C.  Sexually Transmitted Diseases (STDs)  You should be screened each year for STDs including gonorrhea and chlamydia if: ? You are sexually active and are younger than 72 years of age. ? You are older than 72 years of age and your health care provider tells you that you are at risk for this type of infection. ? Your sexual activity has changed since you were last screened and you are at an increased risk for chlamydia or gonorrhea. Ask your health care provider if you are at risk.  Talk with your health care provider about whether you are at high risk of being infected with HIV. Your health care provider may recommend a  prescription medicine to help prevent HIV infection.  What else can I do?  Schedule regular health, dental, and eye exams.  Stay current with your vaccines (immunizations).  Do not use any tobacco products, such as cigarettes, chewing tobacco, and e-cigarettes. If you need help quitting, ask your health care provider.  Limit alcohol intake to no more than 2 drinks per day. One drink equals 12 ounces of beer, 5 ounces of wine, or 1 ounces of hard liquor.  Do not use street drugs.  Do not share needles.  Ask your health care provider for help if you need support or information about quitting drugs.  Tell your health care provider if you often feel depressed.  Tell your health care provider if you have ever been abused or do not feel safe at home. This information is not intended to replace advice given to you by your health care provider. Make sure you discuss any questions you have with your health  care provider. Document Released: 03/28/2008 Document Revised: 05/29/2016 Document Reviewed: 07/04/2015 Elsevier Interactive Patient Education  2018 [employer]  

## 2018-07-23 NOTE — Progress Notes (Signed)
Benjamin Mckay is a 72 y.o. male with the following history as recorded in EpicCare:  Patient Active Problem List   Diagnosis Date Noted  . Medicare annual wellness visit, subsequent 07/21/2017  . Routine adult health maintenance 07/21/2017  . Pleural effusion, malignant 10/02/2016  . Rotator cuff tendinitis 09/10/2016  . VT (ventricular tachycardia) ([place]) 06/12/2016  . 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 Insomnia 03/08/2016  . Solitary pulmonary nodule 01/09/2016  . Hyperlipidemia 12/12/2015  . History of sustained ventricular tachycardia 01/30/2015  . CHF (congestive heart failure) ([place]) 12/18/2014  . Warfarin anticoagulation 12/08/2014  . ARF (acute renal failure) ([place]) 12/07/2014  . Cardiomyopathy, ischemic 11/07/2013  . CHB (complete heart block) ([place]) 11/06/2013  . Single chamber St. Jude pacemaker 2013 11/06/2013  . Chronic atrial fibrillation 01/29/2013  . HTN (hypertension) 02/01/2012  . Carotid stenosis 02/01/2012  . Renal artery stenosis ([place]) 02/01/2012  . CAD (coronary artery disease) 02/01/2012  . Esophageal reflux 08/21/2011  . Hemorrhage of rectum and anus 04/26/2011  . HERNIA, UMBILICAL 88/28/0034  . UNSPECIFIED CARDIAC DYSRHYTHMIA 11/11/2007  . Transient cerebral ischemia 11/11/2007  . ESOPHAGEAL STRICTURE 11/11/2007  . DIVERTICULOSIS OF COLON 11/11/2007  . Non-Hodgkin's lymphoma of intestine ([place]) 09/03/2007    Current Outpatient Medications  Medication Sig Dispense Refill  . acetaminophen (TYLENOL) 500 MG tablet Take 1,000 mg by mouth every 6 (six) hours as needed for moderate pain.     . cetirizine (ZYRTEC) 10 MG tablet Take 1 tablet (10 mg total) by mouth daily. 30 tablet 11  . fluticasone (FLONASE) 50 MCG/ACT nasal spray Place 2 sprays into both nostrils daily. 16 g 6  . furosemide (LASIX) 20 MG tablet TAKE 1 TO 2 TABLETS BY MOUTH DAILY AS DIRECTED 180 tablet 1  . metoprolol succinate (TOPROL-XL) 25 MG 24 hr tablet TAKE 1 TABLET BY MOUTH  TWICE A DAY 180 tablet 3  . pravastatin (PRAVACHOL) 80 MG tablet Take 1 tablet (80 mg total) by mouth daily. 90 tablet 3  . Tamsulosin HCl (FLOMAX) 0.4 MG CAPS Take 0.4 mg by mouth at bedtime.     . traZODone (DESYREL) 50 MG tablet TAKE 0.5-1 TABLETS (25-50 MG TOTAL) BY MOUTH AT BEDTIME AS NEEDED FOR SLEEP. 30 tablet 1  . warfarin (COUMADIN) 2.5 MG tablet Take 1 to 1 and 1/2 tablets by mouth daily as directed by coumadin clinic 135 tablet 1   No current facility-administered medications for this visit.     Allergies: Statins; Adhesive [tape]; Latex; and Penicillins  Past Medical History:  Diagnosis Date  . Anxiety   . Atrial fibrillation ([place])   . Cardiomyopathy, ischemic 11/07/2013  . Cataract   . CHB (complete heart block) ([place]) 11/06/2013  . Chronic combined systolic and diastolic CHF (congestive heart failure) ([place])   . Colon polyps   . Colon polyps   . Congenital heart block   . Constipation   . CVA (cerebral infarction)   . Depression   . Diverticulosis   . Esophageal stricture   . Family history of adverse reaction to anesthesia   . Hyperlipidemia   . Hypertension   . Hyperthyroidism 11/16/2015  . Lymphoma ([place]) 1998   of colon   . Presence of permanent cardiac pacemaker    St. Jude  pt.states he is totally dependent on pacemaker  . Shortness of breath dyspnea    Pulmonary Effusion  . Stroke [facility])    [id number] TIA  . Umbilical hernia   . Urinary frequency  Past Surgical History:  Procedure Laterality Date  . APPENDECTOMY  1953  . CARDIAC CATHETERIZATION  01/06/2003   Recommend medical therapy  . CARDIOVASCULAR STRESS TEST  07/16/2012   Mild-moderate perfusion defect seen in Basal inferior, Mid inferior, and Apica lateral consistent with infarct/scar. No scintigraphic evidence for inducible myocardial ischemia. No ECG changes. EKG negative for ischemia.  Marland Kitchen CAROTID DOPPLER  11/04/2012   Proximal Rt ICA 50-99% diameter reduction; Lft Bulb demonstrated mild amount  homogeneous plaque-not hemodynamically significant; Lft ICA-normal patency.  . CHEST TUBE INSERTION Right 03/15/2016   Procedure: INSERTION PLEURAL DRAINAGE CATHETER;  Surgeon: Ivin Poot, MD;  Location: [place] [facility];  Service: Thoracic;  Laterality: Right;  . COLECTOMY     for lymphoma  . COLONOSCOPY    . neck fusion    . PACEMAKER GENERATOR CHANGE  01/31/2012   St Jude Med Accent DR RF model [id number] serial [id number]  . PACEMAKER PLACEMENT     replaced 3 x  . PERMANENT PACEMAKER GENERATOR CHANGE N/A 01/31/2012   Procedure: PERMANENT PACEMAKER GENERATOR CHANGE;  Surgeon: Sanda Klein, MD;  Location: [place] CATH LAB;   . PLEURADESIS N/A 04/02/2016   Procedure: PLEURADESIS;  Surgeon: Ivin Poot, MD;  Location: [place];  Service: Thoracic;  Laterality: N/A;  . REMOVAL OF PLEURAL DRAINAGE CATHETER Right 07/26/2016   Procedure: REMOVAL OF PLEURAL DRAINAGE CATHETER;  Surgeon: Ivin Poot, MD;  Location: [facility];  Service: Thoracic;  Laterality: Right;  . TONSILLECTOMY    . TRANSTHORACIC ECHOCARDIOGRAM  11/04/2012   EF 94-76%, systolic function moderately reduced, mild regurg of the aortic and mitral valves.  Marland Kitchen VIDEO ASSISTED THORACOSCOPY Right 04/02/2016   Procedure: Right VIDEO ASSISTED THORACOSCOPY with Biopsies and drainage pleural effusion;  Surgeon: Ivin Poot, MD;  Location: [facility] OR;  Service: Thoracic;  Laterality: Right;    Family History  Problem Relation Age of Onset  . Prostate cancer Father   . Heart disease Mother   . Stroke Mother   . Diabetes Maternal Grandmother   . Colon cancer Neg Hx   . Heart attack Neg Hx     Social History   Tobacco Use  . Smoking status: Never Smoker  . Smokeless tobacco: Never Used  Substance Use Topics  . Alcohol use: No    Alcohol/week: 0.0 standard drinks     Subjective:  Benjamin Mckay is here today for annual CPE.Marland Kitchen  CPE-  Last dental exam: biannual cleanings Last vision exam: last exam 10/2017 PSA- followed by urology Lung cancer- never  been a smoker Colonoscopy: up to date Lipids: lipid panel today DM screening- A1c today Vaccinations: flu vacc today, declines TDAP Diet and exercise: does drink sodas, tries to watch his weight, thinking of losing 10 pounds, plays golf  Review of Systems  Constitutional: Negative for chills and fever.  HENT: Negative for hearing loss and sore throat.   Eyes: Negative for blurred vision and double vision.  Respiratory: Negative for cough and shortness of breath.   Cardiovascular: Negative for chest pain.  Gastrointestinal: Negative for constipation, diarrhea and heartburn.  Genitourinary: Negative for dysuria and hematuria.  Musculoskeletal: Negative for falls.  Skin: Positive for rash.  Neurological: Negative for speech change, loss of consciousness and headaches.  Endo/Heme/Allergies: Does not bruise/bleed easily.  Psychiatric/Behavioral: Negative for depression. The patient has insomnia. The patient is not nervous/anxious.    Rash- itchy red rash to left chest wall, present for several days not, tried OTC creams with temporary relief,  not sure what he came into contact with, does not want any treatment for this today  Insomnia- maintained on trazodone PRN at bedtime with improvement in sleep and no noted adverse effects  Objective:  Vitals:   07/23/18 0854  BP: 138/90  Pulse: 83  SpO2: 96%  Weight: 215 lb (97.5 kg)  Height: 5\' 9"  (1.753 m)    Body mass index is 31.75 kg/m.  General: Well developed, well nourished, in no acute distress  Skin : Warm and dry.  Erythematous maculopapular rash noted to left torso Head: Normocephalic and atraumatic  Eyes: Sclera and conjunctiva clear; pupils round and reactive to light; extraocular movements intact  Ears: External normal; canals clear; tympanic membranes normal  Oropharynx: Pink, supple. No suspicious lesions  Neck: Supple without thyromegaly, adenopathy  Lungs: Respirations unlabored; clear to auscultation bilaterally  without wheeze, rales, rhonchi  CVS exam: normal rate, regular rhythm, normal S1, S2, no murmurs, rubs, clicks or gallops.  Abdomen: Soft; nontender; nondistended; normoactive bowel sounds; no masses or hepatosplenomegaly  Musculoskeletal: No deformities; no active joint inflammation  Extremities: No edema, cyanosis   Vessels: Symmetric bilaterally  Neurologic: Alert and oriented; speech intact; face symmetrical; moves all extremities well; CNII-XII intact without focal deficit    Assessment:  1. Routine general medical examination at a health care facility   2. Need for influenza vaccination   3. Essential hypertension   4. Screening for cholesterol level   5. Elevated glucose   6. Encounter for hepatitis C virus screening test for high risk patient   7. Rash   8. Insomnia, unspecified type     Plan:   Return in about 1 year (around 07/24/2019) for CPE.  Orders Placed This Encounter  Procedures  . Flu vaccine HIGH DOSE PF  . Lipid panel    Standing Status:   Future    Standing Expiration Date:   07/24/2019  . CBC    Standing Status:   Future    Standing Expiration Date:   07/24/2019  . Comprehensive metabolic panel    Standing Status:   Future    Standing Expiration Date:   07/24/2019  . Hemoglobin A1c    Standing Status:   Future    Standing Expiration Date:   07/24/2019  . Hepatitis C antibody    Standing Status:   Future    Standing Expiration Date:   08/23/2018    Requested Prescriptions    No prescriptions requested or ordered in this encounter    Rash Looks like contact dermatitis, we discussed treatment with topical steroid but he declines F/u for new, worsening or persistent symptoms

## 2018-07-23 NOTE — Assessment & Plan Note (Signed)
Stable meds by cardiology, continue  Continue to monitor - Lipid panel; Future - CBC; Future - Comprehensive metabolic panel; Future

## 2018-07-23 NOTE — Assessment & Plan Note (Signed)
Stable Continue trazodone prn F/u for new, worsening symptoms

## 2018-07-27 ENCOUNTER — Encounter: Payer: Self-pay | Admitting: *Deleted

## 2018-07-27 LAB — HEPATITIS C ANTIBODY
HEP C AB: REACTIVE — AB
SIGNAL TO CUT-OFF: 2.64 — ABNORMAL HIGH (ref <1.00)

## 2018-07-27 LAB — HCV RNA,QUANTITATIVE REAL TIME PCR
HCV Quantitative Log: <1.18 Log IU/mL
HCV RNA, PCR, QN: <15 IU/mL

## 2018-08-07 ENCOUNTER — Other Ambulatory Visit: Payer: Self-pay | Admitting: Nurse Practitioner

## 2018-08-25 DIAGNOSIS — D225 Melanocytic nevi of trunk: Secondary | ICD-10-CM | POA: Diagnosis not present

## 2018-08-25 DIAGNOSIS — D485 Neoplasm of uncertain behavior of skin: Secondary | ICD-10-CM | POA: Diagnosis not present

## 2018-08-25 DIAGNOSIS — L821 Other seborrheic keratosis: Secondary | ICD-10-CM | POA: Diagnosis not present

## 2018-08-25 DIAGNOSIS — L309 Dermatitis, unspecified: Secondary | ICD-10-CM | POA: Diagnosis not present

## 2018-08-25 DIAGNOSIS — D229 Melanocytic nevi, unspecified: Secondary | ICD-10-CM | POA: Diagnosis not present

## 2018-08-25 DIAGNOSIS — L814 Other melanin hyperpigmentation: Secondary | ICD-10-CM | POA: Diagnosis not present

## 2018-08-25 DIAGNOSIS — D1801 Hemangioma of skin and subcutaneous tissue: Secondary | ICD-10-CM | POA: Diagnosis not present

## 2018-08-26 ENCOUNTER — Ambulatory Visit (INDEPENDENT_AMBULATORY_CARE_PROVIDER_SITE_OTHER): Payer: PPO | Admitting: Pharmacist Clinician (PhC)/ Clinical Pharmacy Specialist

## 2018-08-26 DIAGNOSIS — I482 Chronic atrial fibrillation, unspecified: Secondary | ICD-10-CM

## 2018-08-26 DIAGNOSIS — Z7901 Long term (current) use of anticoagulants: Secondary | ICD-10-CM | POA: Diagnosis not present

## 2018-08-26 LAB — POCT INR: INR: 2.7 (ref 2.0–3.0)

## 2018-08-26 NOTE — Patient Instructions (Signed)
Description   Continue with 1.5 tablets every day.  Repeat INR in 4 weeks

## 2018-09-02 ENCOUNTER — Ambulatory Visit (INDEPENDENT_AMBULATORY_CARE_PROVIDER_SITE_OTHER): Payer: PPO

## 2018-09-02 DIAGNOSIS — I442 Atrioventricular block, complete: Secondary | ICD-10-CM

## 2018-09-03 NOTE — Progress Notes (Signed)
Remote pacemaker transmission.   

## 2018-09-11 ENCOUNTER — Other Ambulatory Visit: Payer: Self-pay | Admitting: Internal Medicine

## 2018-09-21 DIAGNOSIS — R3121 Asymptomatic microscopic hematuria: Secondary | ICD-10-CM | POA: Diagnosis not present

## 2018-09-21 DIAGNOSIS — N401 Enlarged prostate with lower urinary tract symptoms: Secondary | ICD-10-CM | POA: Diagnosis not present

## 2018-09-21 DIAGNOSIS — R350 Frequency of micturition: Secondary | ICD-10-CM | POA: Diagnosis not present

## 2018-09-23 ENCOUNTER — Ambulatory Visit (INDEPENDENT_AMBULATORY_CARE_PROVIDER_SITE_OTHER): Payer: PPO | Admitting: Pharmacist Clinician (PhC)/ Clinical Pharmacy Specialist

## 2018-09-23 DIAGNOSIS — I482 Chronic atrial fibrillation, unspecified: Secondary | ICD-10-CM | POA: Diagnosis not present

## 2018-09-23 LAB — POCT INR: INR: 2.5 (ref 2.0–3.0)

## 2018-10-05 ENCOUNTER — Ambulatory Visit: Payer: Self-pay | Admitting: *Deleted

## 2018-10-05 MED ORDER — OSELTAMIVIR PHOSPHATE 75 MG PO CAPS: 75.0000 mg | ORAL_CAPSULE | Freq: Every day | ORAL | 0 refills | Status: DC

## 2018-10-05 NOTE — Telephone Encounter (Signed)
Pt informed of below.  

## 2018-10-05 NOTE — Telephone Encounter (Signed)
Contacted pt regarding request for for tamiflu; his grandson was diagnosed with the flu; the pt would like a prescription due to his heart condition, and having to keep his grandson all the time; nurse triage initiated and recommendations per protocol; the pt  uses [facility], [place]; he can be contacted at [phone], and a detailed message can be left; the pt is normally seen by Caesar Chestnut, [facility]; will route to office for final disposition.     Reason for Disposition . [1] Influenza EXPOSURE (Close Contact) within last 48 hours (2 days) AND [2] exposed person is HIGH RISK (e.g., age > 12 years, pregnant, HIV+, chronic medical condition)  Answer Assessment - Initial Assessment Questions 1. TYPE of EXPOSURE: "How were you exposed?" (e.g., close contact, not a close contact)     grandson 2. DATE of EXPOSURE: "When did the exposure occur?" (e.g., hour, days, weeks)    10/04/18 3. PREGNANCY: "Is there any chance you are pregnant?" "When was your last menstrual period?"     n/a 4. HIGH RISK for COMPLICATIONS: "Do you have any heart or lung problems? Do you have a weakened immune system?" (e.g., CHF, COPD, asthma, HIV positive, chemotherapy, renal failure, diabetes mellitus, sickle cell anemia)     Heart failure 5. SYMPTOMS: "Do you have any symptoms?" (e.g., cough, fever, sore throat, difficulty breathing).     no  Protocols used: INFLUENZA EXPOSURE-A-AH

## 2018-10-05 NOTE — Addendum Note (Signed)
Addended by: Sherlene Shams on: 10/05/2018 01:18 PM   Modules accepted: Orders

## 2018-10-05 NOTE — Telephone Encounter (Signed)
Okay- sending in Tamiflu as requested; One tablet daily x 10 days;

## 2018-10-21 ENCOUNTER — Ambulatory Visit (INDEPENDENT_AMBULATORY_CARE_PROVIDER_SITE_OTHER): Payer: PPO | Admitting: Pharmacist

## 2018-10-21 DIAGNOSIS — I482 Chronic atrial fibrillation, unspecified: Secondary | ICD-10-CM | POA: Diagnosis not present

## 2018-10-21 LAB — POCT INR: INR: 2.8 (ref 2.0–3.0)

## 2018-10-27 DIAGNOSIS — Z961 Presence of intraocular lens: Secondary | ICD-10-CM | POA: Diagnosis not present

## 2018-10-27 DIAGNOSIS — H26492 Other secondary cataract, left eye: Secondary | ICD-10-CM | POA: Diagnosis not present

## 2018-10-27 DIAGNOSIS — H1849 Other corneal degeneration: Secondary | ICD-10-CM | POA: Diagnosis not present

## 2018-10-30 LAB — CUP PACEART REMOTE DEVICE CHECK
Battery Remaining Longevity: 97 mo
Battery Remaining Percentage: 81 %
Battery Voltage: 2.93 V
Brady Statistic RV Percent Paced: 43 %
Date Time Interrogation Session: 20191120104009
Implantable Lead Implant Date: 20130419
Implantable Lead Location: 753860
Implantable Pulse Generator Implant Date: 20130419
Lead Channel Impedance Value: 430 Ohm
Lead Channel Pacing Threshold Amplitude: 0.75 V
Lead Channel Pacing Threshold Pulse Width: 0.5 ms
Lead Channel Sensing Intrinsic Amplitude: 12 mV
Lead Channel Setting Pacing Pulse Width: 0.5 ms
Lead Channel Setting Sensing Sensitivity: 2.5 mV
MDC IDC PG SERIAL: 7313697
MDC IDC SET LEADCHNL RV PACING AMPLITUDE: 2.5 V

## 2018-11-12 ENCOUNTER — Other Ambulatory Visit: Payer: Self-pay | Admitting: Nurse Practitioner

## 2018-11-25 ENCOUNTER — Other Ambulatory Visit: Payer: Self-pay | Admitting: Cardiovascular Disease

## 2018-11-25 ENCOUNTER — Ambulatory Visit (INDEPENDENT_AMBULATORY_CARE_PROVIDER_SITE_OTHER): Payer: PPO | Admitting: Pharmacist Clinician (PhC)/ Clinical Pharmacy Specialist

## 2018-11-25 ENCOUNTER — Telehealth: Payer: Self-pay | Admitting: *Deleted

## 2018-11-25 DIAGNOSIS — Z7901 Long term (current) use of anticoagulants: Secondary | ICD-10-CM

## 2018-11-25 DIAGNOSIS — I482 Chronic atrial fibrillation, unspecified: Secondary | ICD-10-CM | POA: Diagnosis not present

## 2018-11-25 LAB — POCT INR: INR: 2.7 (ref 2.0–3.0)

## 2018-11-25 NOTE — Telephone Encounter (Signed)
Spoke with patient to schedule DC appointment for [facility] reprogramming per Dr. Sallyanne Kuster. RF timeout occurred, likely due to frequent HVRs. Patient is agreeable to an appointment on 12/10/18 at 10:30am. Patient denies questions or concerns at this time.  At patient's request, called back and LMOVM with office address and appointment information. [place] phone number for questions/concerns.

## 2018-11-25 NOTE — Telephone Encounter (Signed)
After discussing with Dr. Sallyanne Kuster, plan to increase HVR detection to 175bpm, rather than 200bpm (option to do that in the future if necessary). Appointment notes updated.

## 2018-11-25 NOTE — Telephone Encounter (Deleted)
-----   Message from Sanda Klein, MD sent at 11/16/2018  6:02 PM EST ----- Regarding: RE: RF timeout Knowing his history, it is probably best to bring him in to make the changes, but it is not an emergency.  I know you are really busy.  I do have a slot that she can bring him in before the end of February?  If not I will see if I can squeeze him in somewhere in the office. MCr ----- Message ----- From: Mechele Dawley, RN Sent: 11/16/2018  11:45 AM EST To: Sanda Klein, MD Subject: RF timeout                                     Dr. Sallyanne Kuster,  Mr. Callaway' PPM is in RF timeout due to his frequent HVR episodes. I see in the appointment notes for his 01/06/19 OV it says to consider increasing HVR detection to 200bpm. I don't know that he'll make it the full 30 days without another HVR to allow RF timeout to clear. Please advise if we should bring patient in sooner for reprogramming, or if you're ok with not seeing HVR EGMs until his OV.  Thanks, Raquel Sarna

## 2018-11-25 NOTE — Telephone Encounter (Signed)
-----   Message from Sanda Klein, MD sent at 11/16/2018  6:02 PM EST ----- Regarding: RE: RF timeout Knowing his history, it is probably best to bring him in to make the changes, but it is not an emergency.  I know you are really busy.  I do have a slot that she can bring him in before the end of February?  If not I will see if I can squeeze him in somewhere in the office. MCr ----- Message ----- From: Mechele Dawley, RN Sent: 11/16/2018  11:45 AM EST To: Sanda Klein, MD Subject: RF timeout                                     Dr. Sallyanne Kuster,  Mr. Ginger' PPM is in RF timeout due to his frequent HVR episodes. I see in the appointment notes for his 01/06/19 OV it says to consider increasing HVR detection to 200bpm. I don't know that he'll make it the full 30 days without another HVR to allow RF timeout to clear. Please advise if we should bring patient in sooner for reprogramming, or if you're ok with not seeing HVR EGMs until his OV.  Thanks, Raquel Sarna

## 2018-12-02 ENCOUNTER — Ambulatory Visit (INDEPENDENT_AMBULATORY_CARE_PROVIDER_SITE_OTHER): Payer: PPO

## 2018-12-02 DIAGNOSIS — I4891 Unspecified atrial fibrillation: Secondary | ICD-10-CM | POA: Diagnosis not present

## 2018-12-02 DIAGNOSIS — I442 Atrioventricular block, complete: Secondary | ICD-10-CM

## 2018-12-04 LAB — CUP PACEART REMOTE DEVICE CHECK
Battery Remaining Longevity: 97 mo
Battery Remaining Percentage: 81 %
Battery Voltage: 2.93 V
Brady Statistic RV Percent Paced: 43 %
Date Time Interrogation Session: 20200219071319
Implantable Lead Location: 753860
Implantable Pulse Generator Implant Date: 20130419
Lead Channel Impedance Value: 430 Ohm
Lead Channel Pacing Threshold Amplitude: 0.75 V
Lead Channel Pacing Threshold Pulse Width: 0.5 ms
Lead Channel Setting Pacing Amplitude: 2.5 V
Lead Channel Setting Pacing Pulse Width: 0.5 ms
Lead Channel Setting Sensing Sensitivity: 2.5 mV
MDC IDC LEAD IMPLANT DT: 20130419
MDC IDC MSMT LEADCHNL RV SENSING INTR AMPL: 10.3 mV
Pulse Gen Model: 1210
Pulse Gen Serial Number: 7313697

## 2018-12-10 ENCOUNTER — Ambulatory Visit (INDEPENDENT_AMBULATORY_CARE_PROVIDER_SITE_OTHER): Payer: PPO | Admitting: *Deleted

## 2018-12-10 DIAGNOSIS — I482 Chronic atrial fibrillation, unspecified: Secondary | ICD-10-CM | POA: Diagnosis not present

## 2018-12-10 DIAGNOSIS — Z950 Presence of cardiac pacemaker: Secondary | ICD-10-CM | POA: Diagnosis not present

## 2018-12-10 DIAGNOSIS — I442 Atrioventricular block, complete: Secondary | ICD-10-CM | POA: Diagnosis not present

## 2018-12-10 LAB — CUP PACEART INCLINIC DEVICE CHECK
Battery Remaining Longevity: 99 mo
Battery Voltage: 2.93 V
Brady Statistic RV Percent Paced: 43 %
Date Time Interrogation Session: 20200227163942
Implantable Lead Implant Date: 20130419
Implantable Lead Location: 753860
Implantable Pulse Generator Implant Date: 20130419
Lead Channel Impedance Value: 475 Ohm
Lead Channel Pacing Threshold Pulse Width: 0.5 ms
Lead Channel Sensing Intrinsic Amplitude: 12 mV
Lead Channel Setting Pacing Amplitude: 2.5 V
Lead Channel Setting Pacing Pulse Width: 0.5 ms
Lead Channel Setting Sensing Sensitivity: 2.5 mV
MDC IDC MSMT LEADCHNL RV PACING THRESHOLD AMPLITUDE: 0.75 V
Pulse Gen Model: 1210
Pulse Gen Serial Number: 7313697

## 2018-12-10 NOTE — Progress Notes (Signed)
Pacemaker check in clinic for HVR detection reprogramming. Normal device function. Thresholds, sensing, impedances consistent with previous measurements. Device programmed to maximize longevity. Permanent AF, +warfarin. 5600 high ventricular rates recorded since 06/04/17--available EGMs consistent with previous episodes, likely AF w/RVR and suspected accelerated junctional rhythm per [facility]. Reprogrammed HVR detection to 175bpm, counters cleared today. Device programmed at appropriate safety margins. Histogram distribution appropriate for patient activity level. Device programmed to optimize intrinsic conduction. Estimated longevity 8.3 years. Patient enrolled in remote follow-up. Patient education completed. [place] on 03/03/19 and ROV with [place] on 01/06/19 as scheduled.

## 2018-12-10 NOTE — Progress Notes (Signed)
Remote pacemaker transmission.   

## 2018-12-11 ENCOUNTER — Encounter: Payer: Self-pay | Admitting: Cardiology

## 2018-12-31 ENCOUNTER — Telehealth: Payer: Self-pay

## 2018-12-31 NOTE — Telephone Encounter (Signed)
   Primary Cardiologist: Sanda Klein, MD   Pt contacted.  History and symptoms reviewed.  Pt will f/u with [facility] provider as scheduled. Patient had PPM reprogramming changes made last month and insists on being seen to go over those changes. Pt. advised that we are restricting visitors at this time and request that only patients present for check-in prior to their appointment.  All other visitors should remain in their car.  If necessary, only one visitor may come with the patient, into the building. For everyone's safety, all patients and visitor entering our practice area should expect to be screened again prior to entering our waiting area.  Benjamin Mckay, [place]  12/31/2018 4:07 PM

## 2018-12-31 NOTE — Telephone Encounter (Signed)

## 2019-01-04 ENCOUNTER — Telehealth: Payer: Self-pay

## 2019-01-04 NOTE — Telephone Encounter (Signed)

## 2019-01-05 ENCOUNTER — Telehealth: Payer: Self-pay | Admitting: Cardiovascular Disease

## 2019-01-05 NOTE — Telephone Encounter (Signed)
Medications reviewed with pt for scheduled appt tomorrow, 3/25 with Dr. Sallyanne Kuster. Made changes where needed. Med list and pharmacy updated.

## 2019-01-06 ENCOUNTER — Encounter: Payer: Self-pay | Admitting: Cardiovascular Disease

## 2019-01-06 ENCOUNTER — Ambulatory Visit: Payer: PPO | Admitting: Cardiovascular Disease

## 2019-01-06 ENCOUNTER — Other Ambulatory Visit: Payer: Self-pay

## 2019-01-06 ENCOUNTER — Ambulatory Visit (INDEPENDENT_AMBULATORY_CARE_PROVIDER_SITE_OTHER): Payer: PPO | Admitting: Pharmacist Clinician (PhC)/ Clinical Pharmacy Specialist

## 2019-01-06 VITALS — BP 139/60 | HR 65 | Ht 69.0 in | Wt 207.0 lb

## 2019-01-06 DIAGNOSIS — E7800 Pure hypercholesterolemia, unspecified: Secondary | ICD-10-CM

## 2019-01-06 DIAGNOSIS — I482 Chronic atrial fibrillation, unspecified: Secondary | ICD-10-CM | POA: Diagnosis not present

## 2019-01-06 DIAGNOSIS — I442 Atrioventricular block, complete: Secondary | ICD-10-CM

## 2019-01-06 DIAGNOSIS — I472 Ventricular tachycardia, unspecified: Secondary | ICD-10-CM

## 2019-01-06 DIAGNOSIS — I2510 Atherosclerotic heart disease of native coronary artery without angina pectoris: Secondary | ICD-10-CM | POA: Diagnosis not present

## 2019-01-06 DIAGNOSIS — Z950 Presence of cardiac pacemaker: Secondary | ICD-10-CM | POA: Diagnosis not present

## 2019-01-06 DIAGNOSIS — I5042 Chronic combined systolic (congestive) and diastolic (congestive) heart failure: Secondary | ICD-10-CM

## 2019-01-06 DIAGNOSIS — I6529 Occlusion and stenosis of unspecified carotid artery: Secondary | ICD-10-CM

## 2019-01-06 DIAGNOSIS — I10 Essential (primary) hypertension: Secondary | ICD-10-CM | POA: Diagnosis not present

## 2019-01-06 DIAGNOSIS — Z7901 Long term (current) use of anticoagulants: Secondary | ICD-10-CM

## 2019-01-06 DIAGNOSIS — I701 Atherosclerosis of renal artery: Secondary | ICD-10-CM | POA: Diagnosis not present

## 2019-01-06 LAB — LIPID PANEL
Chol/HDL Ratio: 4.7 ratio (ref 0.0–5.0)
Cholesterol, Total: 204 mg/dL — ABNORMAL HIGH (ref 100–199)
HDL: 43 mg/dL (ref >39)
LDL Calculated: 147 mg/dL — ABNORMAL HIGH (ref 0–99)
Triglycerides: 71 mg/dL (ref 0–149)
VLDL Cholesterol Cal: 14 mg/dL (ref 5–40)

## 2019-01-06 LAB — POCT INR: INR: 2.2 (ref 2.0–3.0)

## 2019-01-06 NOTE — Progress Notes (Signed)
. Patient ID: Benjamin Mckay, male   DOB: [birth year], 73 y.o.   MRN: [id number] Patient ID: Benjamin Mckay [facility], male   DOB: 09-17-1946, 73 y.o.   MRN: [id number]    Cardiology Office Note    Date:  01/06/2019   ID:  Benjamin Mckay, DOB 11/28/1945, MRN [id number]  PCP:  Lance Sell, NP  Cardiologist:   Sanda Klein, MD   Chief complaint: Pacemaker check   History of Present Illness:  Benjamin Mckay is a 73 y.o. male with complex cardiac history (complete heart block-pacemaker dependent, chronic atrial fibrillation versus atrial standstill, history of sustained ventricular tachycardia and near syncope, coronary artery disease with history of inferior wall scar due to myocardial infarction, renal and carotid artery stenosis), amiodarone induced hyperthyroidism (now quiescent) and a recurrent right pleural effusion (s/p pleurodesis June 2017).  Mylan continues to feel well.  He is living with his grandson and trying to help him through remote learning which is causing some frustrations on both sides.  Otherwise he has really no complaints.  She takes an additional dose of diuretic once or twice a week to help with leg edema, but has not had issues with shortness of breath or major weight gain.  He is unaware of palpitations.  He has not had syncope or presyncope although he has occasional spells of dizziness.  He cannot pinpoint any predictive for precipitating factors for his dizzy spells.  He has not had angina orthopnea or PND.    He denies any neurological events or bleeding problems.  Warfarin anticoagulation is well-tolerated and his INR is therapeutic today.  At his last physical his LDL cholesterol was around 130.  He has not tolerated statin therapy.  Comprehensive pacemaker interrogation shows roughly 43% right ventricular pacing. His underlying rhythm is regular at 47 bpm.  His lead parameters remain excellent with a capture threshold 0.75 V at 0.5 ms pulse width, R waves greater  than 12 mV and impedance of 450 ohm.  He has occasional episodes of high ventricular rate (7 times in the last couple of months).  These tend to occur clustered in certain days, about once a week, 2 or 3 times.  They occur at different times of day.  It is unclear whether to have any connection with his "dizzy spells" ".  Battery longevity is estimated at 9 years.   Past Medical History:  Diagnosis Date  . Anxiety   . Atrial fibrillation ([place])   . Cardiomyopathy, ischemic 11/07/2013  . Cataract   . CHB (complete heart block) ([place]) 11/06/2013  . Chronic combined systolic and diastolic CHF (congestive heart failure) ([place])   . Colon polyps   . Colon polyps   . Congenital heart block   . Constipation   . CVA (cerebral infarction)   . Depression   . Diverticulosis   . Esophageal stricture   . Family history of adverse reaction to anesthesia   . Hyperlipidemia   . Hypertension   . Hyperthyroidism 11/16/2015  . Lymphoma ([place]) 1998   of colon   . Presence of permanent cardiac pacemaker    St. Jude  pt.states he is totally dependent on pacemaker  . Shortness of breath dyspnea    Pulmonary Effusion  . Stroke [facility])    [id number] TIA  . Umbilical hernia   . Urinary frequency     Past Surgical History:  Procedure Laterality Date  . APPENDECTOMY  1953  . CARDIAC CATHETERIZATION  01/06/2003   Recommend  medical therapy  . CARDIOVASCULAR STRESS TEST  07/16/2012   Mild-moderate perfusion defect seen in Basal inferior, Mid inferior, and Apica lateral consistent with infarct/scar. No scintigraphic evidence for inducible myocardial ischemia. No ECG changes. EKG negative for ischemia.  Marland Kitchen CAROTID DOPPLER  11/04/2012   Proximal Rt ICA 50-99% diameter reduction; Lft Bulb demonstrated mild amount homogeneous plaque-not hemodynamically significant; Lft ICA-normal patency.  . CHEST TUBE INSERTION Right 03/15/2016   Procedure: INSERTION PLEURAL DRAINAGE CATHETER;  Surgeon: Ivin Poot, MD;  Location: [place];   Service: Thoracic;  Laterality: Right;  . COLECTOMY     for lymphoma  . COLONOSCOPY    . neck fusion    . PACEMAKER GENERATOR CHANGE  01/31/2012   St Jude Med Accent DR RF model [id number] serial [id number]  . PACEMAKER PLACEMENT     replaced 3 x  . PERMANENT PACEMAKER GENERATOR CHANGE N/A 01/31/2012   Procedure: PERMANENT PACEMAKER GENERATOR CHANGE;  Surgeon: Sanda Klein, MD;  Location: [place] CATH LAB;   . PLEURADESIS N/A 04/02/2016   Procedure: PLEURADESIS;  Surgeon: Ivin Poot, MD;  Location: [place];  Service: Thoracic;  Laterality: N/A;  . REMOVAL OF PLEURAL DRAINAGE CATHETER Right 07/26/2016   Procedure: REMOVAL OF PLEURAL DRAINAGE CATHETER;  Surgeon: Ivin Poot, MD;  Location: [place];  Service: Thoracic;  Laterality: Right;  . TONSILLECTOMY    . TRANSTHORACIC ECHOCARDIOGRAM  11/04/2012   EF 83-15%, systolic function moderately reduced, mild regurg of the aortic and mitral valves.  Marland Kitchen VIDEO ASSISTED THORACOSCOPY Right 04/02/2016   Procedure: Right VIDEO ASSISTED THORACOSCOPY with Biopsies and drainage pleural effusion;  Surgeon: Ivin Poot, MD;  Location: [facility] OR;  Service: Thoracic;  Laterality: Right;    Current Medications: Outpatient Medications Prior to Visit  Medication Sig Dispense Refill  . acetaminophen (TYLENOL) 500 MG tablet Take 1,000 mg by mouth every 6 (six) hours as needed for moderate pain.     . cetirizine (ZYRTEC) 10 MG tablet Take 1 tablet (10 mg total) by mouth daily. (Patient taking differently: Take 10 mg by mouth as needed. ) 30 tablet 11  . furosemide (LASIX) 20 MG tablet TAKE 1 TO 2 TABLETS BY MOUTH DAILY AS DIRECTED 180 tablet 1  . metoprolol succinate (TOPROL-XL) 25 MG 24 hr tablet TAKE 1 TABLET BY MOUTH TWICE A DAY 180 tablet 3  . pravastatin (PRAVACHOL) 80 MG tablet TAKE 1 TABLET BY MOUTH EVERY DAY 90 tablet 1  . Tamsulosin HCl (FLOMAX) 0.4 MG CAPS Take 0.4 mg by mouth at bedtime.     . traZODone (DESYREL) 50 MG tablet TAKE 1/2 TO 1 TABLET BY MOUTH AT  BEDTIME AS NEEDED FOR SLEEP 90 tablet 0  . warfarin (COUMADIN) 2.5 MG tablet TAKE 1 TO 1.5 TABLETS BY MOUTH DAILY AS DIRECTED BY COUMADIN CLINIC 135 tablet 1   No facility-administered medications prior to visit.      Allergies:   Statins; Adhesive [tape]; Latex; and Penicillins   Social History   Socioeconomic History  . Marital status: Married    Spouse name: Chartered certified accountant  . Number of children: 3  . Years of education: 21  . Highest education level: Not on file  Occupational History  . Occupation: Retired  Scientific laboratory technician  . Financial resource strain: Not on file  . Food insecurity:    Worry: Not on file    Inability: Not on file  . Transportation needs:    Medical: Not on file    Non-medical: Not  on file  Tobacco Use  . Smoking status: Never Smoker  . Smokeless tobacco: Never Used  Substance and Sexual Activity  . Alcohol use: No    Alcohol/week: 0.0 standard drinks  . Drug use: No  . Sexual activity: Not on file  Lifestyle  . Physical activity:    Days per week: Not on file    Minutes per session: Not on file  . Stress: Not on file  Relationships  . Social connections:    Talks on phone: Not on file    Gets together: Not on file    Attends religious service: Not on file    Active member of club or organization: Not on file    Attends meetings of clubs or organizations: Not on file    Relationship status: Not on file  Other Topics Concern  . Not on file  Social History Narrative   Fun: Golf when he can   Consumes caffeine 2 cups per day       Family History:  The patient's family history includes Diabetes in his maternal grandmother; Heart disease in his mother; Prostate cancer in his father; Stroke in his mother.   ROS:   Please see the history of present illness.    ROS all other systems are reviewed and are negative  PHYSICAL EXAM:   VS:  BP 139/60   Pulse 65   Ht 5\' 9"  (1.753 m)   Wt 207 lb (93.9 kg)   BMI 30.57 kg/m     General: Alert,  oriented x3, no distress, borderline obese, healthy subclavian pacemaker site.  He appears healthy and is quite tanned. Head: no evidence of trauma, PERRL, EOMI, no exophtalmos or lid lag, no myxedema, no xanthelasma; normal ears, nose and oropharynx Neck: normal jugular venous pulsations and no hepatojugular reflux; brisk carotid pulses without delay and no carotid bruits Chest: clear to auscultation, no signs of consolidation by percussion or palpation, normal fremitus, symmetrical and full respiratory excursions Cardiovascular: normal position and quality of the apical impulse, regular rhythm, normal first and paradoxically split second heart sounds, no murmurs, rubs or gallops Abdomen: no tenderness or distention, no masses by palpation, no abnormal pulsatility or arterial bruits, normal bowel sounds, no hepatosplenomegaly Extremities: no clubbing, cyanosis or edema; 2+ radial, ulnar and brachial pulses bilaterally; 2+ right femoral, posterior tibial and dorsalis pedis pulses; 2+ left femoral, posterior tibial and dorsalis pedis pulses; no subclavian or femoral bruits Neurological: grossly nonfocal Psych: Normal mood and affect   Wt Readings from Last 3 Encounters:  01/06/19 207 lb (93.9 kg)  07/23/18 215 lb (97.5 kg)  02/11/18 215 lb (97.5 kg)      Studies/Labs Reviewed:   EKG:  EKG is not ordered today.   Recent Labs: 07/23/2018: ALT 9; BUN 11; Creatinine, Ser 1.02; Hemoglobin 16.8; Platelets 166.0; Potassium 3.9; Sodium 140   Lipid Panel    Component Value Date/Time   CHOL 182 07/23/2018 0949   CHOL 172 06/04/2017 0923   TRIG 67.0 07/23/2018 0949   HDL 38.60 (L) 07/23/2018 0949   HDL 35 (L) 06/04/2017 0923   CHOLHDL 5 07/23/2018 0949   VLDL 13.4 07/23/2018 0949   LDLCALC 130 (H) 07/23/2018 0949   LDLCALC 115 (H) 06/04/2017 8099     ASSESSMENT:    No diagnosis found.   PLAN:  In order of problems listed above:  1. CHF: Clinically euvolemic, doing a good job of  self adjusting his diuretics as far as I can tell.  Not sure if his dizzy spells have anything to do with excessive use of diuretics or his occasional spells of high ventricular rate.  NYHA functional class I-2. 2. CHB: As before, he is clearly not pacemaker dependent and only requires 43% ventricular pacing.  It seems that his underlying rhythm is junctional bradycardia with ventricular, but he has occasional episodes of high ventricular rate and generally seen to suggest atrial flutter with 2: 1 AV block for accelerated junctional rhythm.  In the past there has been concern for ventricular tachycardia, but these rhythm abnormalities have been present for decades and with a single exception a few years ago but have never been associated with symptoms.  Upgrade to CRT device has been discussed but felt to be high-risk due to his multiple previous surgical procedures and increased risk of infection 3. AFib: CHADSVasc 4 (age, CHF, CAD, HTN).  Appropriately anticoagulated.  Trying to maintain balance between excessive ventricular pacing and control of his episodes of high ventricular rate.  We will continue the same medications. 4. CAD: He has evidence of extensive calcification in all 3 coronary arteries, but has never undergone coronary angiography.  Nuclear stress testing shows evidence of an old inferior scar but no reversible ischemia.  He has never complained of angina pectoris.  He prefers to avoid invasive evaluation. 5. HTN: Reasonable control. 6. PPM: Normal device function.  Continue remote downloads every 3 months.  We will see him back in the office in 1 year if there are no interim events. 7. VT: No convincing evidence of ventricular tachycardia on current device check.  His episodes of high ventricular rate seems to be supraventricular in origin, probably atrial flutter with 2: 1 AV block.  He had serious complications from amiodarone in the past including thyrotoxicosis and heart failure  decompensation, with recurrent pleural effusion requiring pleurodesis.  No convincing evidence of ventricular tachycardia has been recorded since he was started on treatment with amiodarone in 2014 for an episode of near syncope and sustained monomorphic VT with a fairly slow cycle length of 370 ms (inferior scar VT?).  8. Anticoagulation: INR in therapeutic range, no bleeding complications. 9. RAS: Stable renal function, controlled blood pressure 10. Carotid stenosis: Rechecked 12/2015, mild progression. Recommend we reevaluate by [place], but patient declined. 11. R pleural effusion: no recurrence, s/p pleurodesis, 2017. 12. HLP: Does not tolerate more potent statins.  Recheck lipid profile again today and discussed initiation of Repatha.  He appeared lukewarm to that suggestion but allowed [place] to research his eligibility via his insurance company.    Medication Adjustments/Labs and Tests Ordered: Current medicines are reviewed at length with the patient today.  Concerns regarding medicines are outlined above.  Medication changes, Labs and Tests ordered today are listed in the Patient Instructions below. Patient Instructions  Medication Instructions:  Your physician recommends that you continue on your current medications as directed. Please refer to the Current Medication list given to you today.  If you need a refill on your cardiac medications before your next appointment, please call your pharmacy.   Follow-Up: At [facility], you and your health needs are our priority.  As part of our continuing mission to provide you with exceptional heart care, we have created designated Provider Care Teams.  These Care Teams include your primary Cardiologist (physician) and Advanced Practice Providers (APPs -  Physician Assistants and Nurse Practitioners) who all work together to provide you with the care you need, when you need it. You will need  a follow up appointment in 1 year.  Please call our office 2  months in advance to schedule this appointment.  You may see Sanda Klein, MD or one of the following Advanced Practice Providers on your designated Care Team: [place], [place] . Fabian Sharp, PA-C  Any Other Special Instructions Will Be Listed Below (If Applicable). None      Signed, Sanda Klein, MD  01/06/2019 10:14 AM    [facility] [place], [place], [place]  [address] Phone: [phone]; Fax: [phone]

## 2019-01-06 NOTE — Patient Instructions (Signed)
Medication Instructions:  Your physician recommends that you continue on your current medications as directed. Please refer to the Current Medication list given to you today.  If you need a refill on your cardiac medications before your next appointment, please call your pharmacy.   Follow-Up: At [facility], you and your health needs are our priority.  As part of our continuing mission to provide you with exceptional heart care, we have created designated Provider Care Teams.  These Care Teams include your primary Cardiologist (physician) and Advanced Practice Providers (APPs -  Physician Assistants and Nurse Practitioners) who all work together to provide you with the care you need, when you need it. You will need a follow up appointment in 1 year.  Please call our office 2 months in advance to schedule this appointment.  You may see Mihai Croitoru, MD or one of the following Advanced Practice Providers on your designated Care Team: Hao Meng, PA-C . Angela Duke, PA-C  Any Other Special Instructions Will Be Listed Below (If Applicable). None   

## 2019-01-12 ENCOUNTER — Other Ambulatory Visit: Payer: Self-pay | Admitting: Cardiothoracic Surgery

## 2019-01-12 DIAGNOSIS — J90 Pleural effusion, not elsewhere classified: Secondary | ICD-10-CM

## 2019-02-12 ENCOUNTER — Other Ambulatory Visit: Payer: Self-pay | Admitting: Family Medicine

## 2019-02-12 ENCOUNTER — Other Ambulatory Visit: Payer: Self-pay | Admitting: *Deleted

## 2019-02-12 MED ORDER — TRAZODONE HCL 50 MG PO TABS: 25.0000 mg | ORAL_TABLET | Freq: Every evening | ORAL | 0 refills | Status: DC | PRN

## 2019-02-16 ENCOUNTER — Telehealth: Payer: Self-pay | Admitting: Pharmacist

## 2019-02-16 MED ORDER — EVOLOCUMAB 140 MG/ML ~~LOC~~ SOAJ: 140.0000 mg | SUBCUTANEOUS | 11 refills | Status: DC

## 2019-02-16 NOTE — Telephone Encounter (Signed)
Prior authorization for repatha approved until June/2020.  Rx sent to prefer pharmacy. Patient will call back if unable to afford co-pay.

## 2019-02-16 NOTE — Telephone Encounter (Signed)
Patient called back to report $90/month was beyond his ability to pay.  Gave him information regarding [employer].  He will contact them and keep [place] informed

## 2019-03-02 ENCOUNTER — Telehealth: Payer: Self-pay

## 2019-03-02 NOTE — Telephone Encounter (Signed)

## 2019-03-03 ENCOUNTER — Other Ambulatory Visit: Payer: Self-pay

## 2019-03-03 ENCOUNTER — Ambulatory Visit (INDEPENDENT_AMBULATORY_CARE_PROVIDER_SITE_OTHER): Payer: PPO | Admitting: Pharmacist

## 2019-03-03 ENCOUNTER — Ambulatory Visit (INDEPENDENT_AMBULATORY_CARE_PROVIDER_SITE_OTHER): Payer: PPO | Admitting: *Deleted

## 2019-03-03 DIAGNOSIS — I442 Atrioventricular block, complete: Secondary | ICD-10-CM

## 2019-03-03 DIAGNOSIS — Z7901 Long term (current) use of anticoagulants: Secondary | ICD-10-CM

## 2019-03-03 DIAGNOSIS — I482 Chronic atrial fibrillation, unspecified: Secondary | ICD-10-CM | POA: Diagnosis not present

## 2019-03-03 LAB — CUP PACEART REMOTE DEVICE CHECK
Date Time Interrogation Session: 20200520183051
Implantable Lead Implant Date: 20130419
Implantable Lead Location: 753860
Implantable Pulse Generator Implant Date: 20130419
Pulse Gen Model: 1210
Pulse Gen Serial Number: 7313697

## 2019-03-03 LAB — POCT INR: INR: 3.9 — AB (ref 2.0–3.0)

## 2019-03-04 ENCOUNTER — Other Ambulatory Visit: Payer: Self-pay

## 2019-03-09 ENCOUNTER — Other Ambulatory Visit: Payer: Self-pay

## 2019-03-09 DIAGNOSIS — D485 Neoplasm of uncertain behavior of skin: Secondary | ICD-10-CM | POA: Diagnosis not present

## 2019-03-09 DIAGNOSIS — L259 Unspecified contact dermatitis, unspecified cause: Secondary | ICD-10-CM | POA: Diagnosis not present

## 2019-03-09 DIAGNOSIS — C44329 Squamous cell carcinoma of skin of other parts of face: Secondary | ICD-10-CM | POA: Diagnosis not present

## 2019-03-09 DIAGNOSIS — Z85828 Personal history of other malignant neoplasm of skin: Secondary | ICD-10-CM | POA: Diagnosis not present

## 2019-03-09 DIAGNOSIS — L905 Scar conditions and fibrosis of skin: Secondary | ICD-10-CM | POA: Diagnosis not present

## 2019-03-10 ENCOUNTER — Ambulatory Visit: Payer: PPO | Admitting: Cardiothoracic Surgery

## 2019-03-10 ENCOUNTER — Ambulatory Visit
Admission: RE | Admit: 2019-03-10 | Discharge: 2019-03-10 | Disposition: A | Discharge status: Home / Self Care | Payer: PPO | Source: Ambulatory Visit | Attending: Cardiothoracic Surgery | Admitting: Cardiothoracic Surgery

## 2019-03-10 ENCOUNTER — Encounter: Payer: Self-pay | Admitting: Cardiothoracic Surgery

## 2019-03-10 VITALS — BP 145/81 | HR 85 | Temp 97.7°F | Resp 20 | Ht 69.0 in | Wt 206.0 lb

## 2019-03-10 DIAGNOSIS — J181 Lobar pneumonia, unspecified organism: Secondary | ICD-10-CM

## 2019-03-10 DIAGNOSIS — J90 Pleural effusion, not elsewhere classified: Secondary | ICD-10-CM | POA: Diagnosis not present

## 2019-03-10 DIAGNOSIS — J929 Pleural plaque without asbestos: Secondary | ICD-10-CM

## 2019-03-10 MED ORDER — IOPAMIDOL (ISOVUE-300) INJECTION 61%
75.0000 mL | Freq: Once | INTRAVENOUS | Status: AC | PRN
  Administered 2019-03-10: 75 mL via INTRAVENOUS

## 2019-03-10 NOTE — Progress Notes (Signed)
PCP is Lance Sell, NP Referring Provider is Juanito Doom, MD  Chief Complaint  Patient presents with  . Pleural Effusion    1 year f/u with Chest CT for pleural thickening    HPI: Patient returns for one-year follow-up with chest CT scan.  In 2017 he had large recurrent right pleural effusion with pain, cough and shortness of breath.  After VATS and talc pleurodesis effusion has significantly decreased and he now mainly has pleural thickening.  Biopsies were all negative for pleural malignancy.  He currently denies symptoms of cough shortness of breath or pain.  He still has some minor postthoracotomy discomfort. CT scan performed today images personally reviewed and discussed with patient. There is been no change in the pleural thickening over the past year.  No evidence of neoplastic process at this point and further surveillance CT scans will not be needed.  He will let us know if he develops symptoms of the cough shortness of breath and pain.   Past Medical History:  Diagnosis Date  . Anxiety   . Atrial fibrillation ([place])   . Cardiomyopathy, ischemic 11/07/2013  . Cataract   . CHB (complete heart block) ([place]) 11/06/2013  . Chronic combined systolic and diastolic CHF (congestive heart failure) ([place])   . Colon polyps   . Colon polyps   . Congenital heart block   . Constipation   . CVA (cerebral infarction)   . Depression   . Diverticulosis   . Esophageal stricture   . Family history of adverse reaction to anesthesia   . Hyperlipidemia   . Hypertension   . Hyperthyroidism 11/16/2015  . Lymphoma ([place]) 1998   of colon   . Presence of permanent cardiac pacemaker    St. Jude  pt.states he is totally dependent on pacemaker  . Shortness of breath dyspnea    Pulmonary Effusion  . Stroke [facility])    [id number] TIA  . Umbilical hernia   . Urinary frequency     Past Surgical History:  Procedure Laterality Date  . APPENDECTOMY  1953  . CARDIAC CATHETERIZATION  01/06/2003    Recommend medical therapy  . CARDIOVASCULAR STRESS TEST  07/16/2012   Mild-moderate perfusion defect seen in Basal inferior, Mid inferior, and Apica lateral consistent with infarct/scar. No scintigraphic evidence for inducible myocardial ischemia. No ECG changes. EKG negative for ischemia.  Marland Kitchen CAROTID DOPPLER  11/04/2012   Proximal Rt ICA 50-99% diameter reduction; Lft Bulb demonstrated mild amount homogeneous plaque-not hemodynamically significant; Lft ICA-normal patency.  . CHEST TUBE INSERTION Right 03/15/2016   Procedure: INSERTION PLEURAL DRAINAGE CATHETER;  Surgeon: Ivin Poot, MD;  Location: [place];  Service: Thoracic;  Laterality: Right;  . COLECTOMY     for lymphoma  . COLONOSCOPY    . neck fusion    . PACEMAKER GENERATOR CHANGE  01/31/2012   St Jude Med Accent DR RF model [id number] serial [id number]  . PACEMAKER PLACEMENT     replaced 3 x  . PERMANENT PACEMAKER GENERATOR CHANGE N/A 01/31/2012   Procedure: PERMANENT PACEMAKER GENERATOR CHANGE;  Surgeon: Sanda Klein, MD;  Location: [place] CATH LAB;   . PLEURADESIS N/A 04/02/2016   Procedure: PLEURADESIS;  Surgeon: Ivin Poot, MD;  Location: [place];  Service: Thoracic;  Laterality: N/A;  . REMOVAL OF PLEURAL DRAINAGE CATHETER Right 07/26/2016   Procedure: REMOVAL OF PLEURAL DRAINAGE CATHETER;  Surgeon: Ivin Poot, MD;  Location: [place];  Service: Thoracic;  Laterality: Right;  . TONSILLECTOMY    .  TRANSTHORACIC ECHOCARDIOGRAM  11/04/2012   EF 76-19%, systolic function moderately reduced, mild regurg of the aortic and mitral valves.  Marland Kitchen VIDEO ASSISTED THORACOSCOPY Right 04/02/2016   Procedure: Right VIDEO ASSISTED THORACOSCOPY with Biopsies and drainage pleural effusion;  Surgeon: Ivin Poot, MD;  Location: [facility] OR;  Service: Thoracic;  Laterality: Right;    Family History  Problem Relation Age of Onset  . Prostate cancer Father   . Heart disease Mother   . Stroke Mother   . Diabetes Maternal Grandmother   . Colon cancer Neg  Hx   . Heart attack Neg Hx     Social History Social History   Tobacco Use  . Smoking status: Never Smoker  . Smokeless tobacco: Never Used  Substance Use Topics  . Alcohol use: No    Alcohol/week: 0.0 standard drinks  . Drug use: No    Current Outpatient Medications  Medication Sig Dispense Refill  . acetaminophen (TYLENOL) 500 MG tablet Take 1,000 mg by mouth every 6 (six) hours as needed for moderate pain.     . cetirizine (ZYRTEC) 10 MG tablet Take 1 tablet (10 mg total) by mouth daily. (Patient taking differently: Take 10 mg by mouth as needed. ) 30 tablet 11  . furosemide (LASIX) 20 MG tablet TAKE 1 TO 2 TABLETS BY MOUTH DAILY AS DIRECTED 180 tablet 1  . metoprolol succinate (TOPROL-XL) 25 MG 24 hr tablet TAKE 1 TABLET BY MOUTH TWICE A DAY 180 tablet 3  . pravastatin (PRAVACHOL) 80 MG tablet TAKE 1 TABLET BY MOUTH EVERY DAY 90 tablet 1  . Tamsulosin HCl (FLOMAX) 0.4 MG CAPS Take 0.4 mg by mouth at bedtime.     . traZODone (DESYREL) 50 MG tablet Take 0.5-1 tablets (25-50 mg total) by mouth at bedtime as needed. for sleep. NEEDS VISIT WITH NEW PROVIDER FOR FUTURE REFILLS. 90 tablet 0  . warfarin (COUMADIN) 2.5 MG tablet TAKE 1 TO 1.5 TABLETS BY MOUTH DAILY AS DIRECTED BY COUMADIN CLINIC 135 tablet 1   No current facility-administered medications for this visit.     Allergies  Allergen Reactions  . Statins Other (See Comments)    Leg cramps.   Tolerates pravastatin.    . Adhesive [Tape] Other (See Comments)    Skin irritation - please use paper tape  . Latex Other (See Comments)    Skin irritation  . Penicillins Rash    Has patient had a PCN reaction causing immediate rash, facial/tongue/throat swelling, SOB or lightheadedness with hypotension: Yes Has patient had a PCN reaction causing severe rash involving mucus membranes or skin necrosis: No Has patient had a PCN reaction that required hospitalization No Has patient had a PCN reaction occurring within the last 10  years: No If all of the above answers are "NO", then may proceed with Cephalosporin use.    Review of Systems   Patient is followed by Dr. Sallyanne Kuster for diastolic heart failure and atrial fibrillation.  His ankle edema is stable. Some weight gain with the COVID restrictions and activity No hospitalizations since last visit No bleeding problems from his Coumadin No cough fever night sweats or closure to COVID virus No abdominal pain No orthopnea or PND  BP (!) 145/81   Pulse 85   Temp 97.7 F (36.5 C) (Skin)   Resp 20   Ht 5\' 9"  (1.753 m)   Wt 206 lb (93.4 kg)   SpO2 97% Comment: RA  BMI 30.42 kg/m  Physical Exam  Exam    General- alert and comfortable    Neck- no JVD, no cervical adenopathy palpable, no carotid bruit   Lungs-slightly diminished breath sounds at the right base   Cor- regular rate and rhythm, no murmur , + S4 gallop   Abdomen- soft, non-tender   Extremities - warm, non-tender, minimal edema   Neuro- oriented, appropriate, no focal weakness   Diagnostic Tests: Images of CT scan performed today personally reviewed and discussed with patient. He has mainly chronic pleural thickening and slight loculated effusion unchanged from last year.  Impression: No active pleural disease at this time.  Further surveillance scans not needed.  Patient will let us know if he develops symptoms of his previous pleural process including cough shortness of breath and pain.  Plan: Return as needed.   Len Childs, MD [facility] [phone]

## 2019-03-10 NOTE — Progress Notes (Signed)
Thank you, Peter 

## 2019-03-12 ENCOUNTER — Encounter: Payer: Self-pay | Admitting: Cardiology

## 2019-03-12 NOTE — Progress Notes (Signed)
Remote pacemaker transmission.   

## 2019-03-22 ENCOUNTER — Ambulatory Visit (INDEPENDENT_AMBULATORY_CARE_PROVIDER_SITE_OTHER): Payer: PPO | Admitting: *Deleted

## 2019-03-22 ENCOUNTER — Other Ambulatory Visit: Payer: Self-pay

## 2019-03-22 DIAGNOSIS — I482 Chronic atrial fibrillation, unspecified: Secondary | ICD-10-CM

## 2019-03-22 DIAGNOSIS — Z7901 Long term (current) use of anticoagulants: Secondary | ICD-10-CM | POA: Diagnosis not present

## 2019-03-22 LAB — POCT INR: INR: 2.8 (ref 2.0–3.0)

## 2019-03-22 NOTE — Patient Instructions (Signed)
Description   Today only take 1/2 tablet, then Continue taking 1.5 tablets every day.  Repeat INR in 4 weeks.

## 2019-03-24 DIAGNOSIS — C44329 Squamous cell carcinoma of skin of other parts of face: Secondary | ICD-10-CM | POA: Diagnosis not present

## 2019-04-12 ENCOUNTER — Telehealth: Payer: Self-pay

## 2019-04-12 NOTE — Telephone Encounter (Signed)

## 2019-04-19 ENCOUNTER — Ambulatory Visit (INDEPENDENT_AMBULATORY_CARE_PROVIDER_SITE_OTHER): Payer: PPO | Admitting: Pharmacist

## 2019-04-19 ENCOUNTER — Other Ambulatory Visit: Payer: Self-pay

## 2019-04-19 DIAGNOSIS — I5023 Acute on chronic systolic (congestive) heart failure: Secondary | ICD-10-CM

## 2019-04-19 DIAGNOSIS — Z7901 Long term (current) use of anticoagulants: Secondary | ICD-10-CM | POA: Diagnosis not present

## 2019-04-19 DIAGNOSIS — I482 Chronic atrial fibrillation, unspecified: Secondary | ICD-10-CM | POA: Diagnosis not present

## 2019-04-19 LAB — POCT INR: INR: 2.8 (ref 2.0–3.0)

## 2019-04-19 MED ORDER — METOPROLOL SUCCINATE ER 25 MG PO TB24: 25.0000 mg | ORAL_TABLET | Freq: Two times a day (BID) | ORAL | 3 refills | Status: DC

## 2019-04-19 MED ORDER — FUROSEMIDE 20 MG PO TABS: ORAL_TABLET | ORAL | 1 refills | Status: DC

## 2019-05-13 ENCOUNTER — Other Ambulatory Visit: Payer: Self-pay | Admitting: Cardiovascular Disease

## 2019-05-13 ENCOUNTER — Other Ambulatory Visit: Payer: Self-pay | Admitting: Internal Medicine

## 2019-05-31 ENCOUNTER — Other Ambulatory Visit: Payer: Self-pay

## 2019-05-31 ENCOUNTER — Ambulatory Visit (INDEPENDENT_AMBULATORY_CARE_PROVIDER_SITE_OTHER): Payer: PPO | Admitting: *Deleted

## 2019-05-31 DIAGNOSIS — I482 Chronic atrial fibrillation, unspecified: Secondary | ICD-10-CM

## 2019-05-31 DIAGNOSIS — Z7901 Long term (current) use of anticoagulants: Secondary | ICD-10-CM | POA: Diagnosis not present

## 2019-05-31 LAB — POCT INR: INR: 2.8 (ref 2.0–3.0)

## 2019-05-31 NOTE — Patient Instructions (Signed)
Description   Continue taking 1.5 tablets every day.  Repeat INR in 6 weeks.

## 2019-06-02 ENCOUNTER — Ambulatory Visit (INDEPENDENT_AMBULATORY_CARE_PROVIDER_SITE_OTHER): Payer: PPO | Admitting: *Deleted

## 2019-06-02 DIAGNOSIS — I442 Atrioventricular block, complete: Secondary | ICD-10-CM | POA: Diagnosis not present

## 2019-06-02 LAB — CUP PACEART REMOTE DEVICE CHECK
Battery Remaining Longevity: 77 mo
Battery Remaining Percentage: 65 %
Battery Voltage: 2.9 V
Brady Statistic RV Percent Paced: 61 %
Date Time Interrogation Session: 20200819061000
Implantable Lead Implant Date: 20130419
Implantable Lead Location: 753860
Implantable Pulse Generator Implant Date: 20130419
Lead Channel Impedance Value: 430 Ohm
Lead Channel Pacing Threshold Amplitude: 0.75 V
Lead Channel Pacing Threshold Pulse Width: 0.5 ms
Lead Channel Sensing Intrinsic Amplitude: 12 mV
Lead Channel Setting Pacing Amplitude: 2.5 V
Lead Channel Setting Pacing Pulse Width: 0.5 ms
Lead Channel Setting Sensing Sensitivity: 2.5 mV
Pulse Gen Model: 1210
Pulse Gen Serial Number: 7313697

## 2019-06-09 ENCOUNTER — Other Ambulatory Visit: Payer: Self-pay | Admitting: Internal Medicine

## 2019-06-10 ENCOUNTER — Encounter: Payer: Self-pay | Admitting: Cardiology

## 2019-06-10 NOTE — Progress Notes (Signed)
Remote pacemaker transmission.   

## 2019-07-12 ENCOUNTER — Other Ambulatory Visit: Payer: Self-pay

## 2019-07-12 ENCOUNTER — Ambulatory Visit (INDEPENDENT_AMBULATORY_CARE_PROVIDER_SITE_OTHER): Payer: PPO | Admitting: Pharmacist Clinician (PhC)/ Clinical Pharmacy Specialist

## 2019-07-12 DIAGNOSIS — Z7901 Long term (current) use of anticoagulants: Secondary | ICD-10-CM | POA: Diagnosis not present

## 2019-07-12 DIAGNOSIS — I482 Chronic atrial fibrillation, unspecified: Secondary | ICD-10-CM

## 2019-07-12 LAB — POCT INR: INR: 2.5 (ref 2.0–3.0)

## 2019-07-26 ENCOUNTER — Other Ambulatory Visit: Payer: Self-pay

## 2019-07-26 ENCOUNTER — Other Ambulatory Visit (INDEPENDENT_AMBULATORY_CARE_PROVIDER_SITE_OTHER): Payer: PPO

## 2019-07-26 ENCOUNTER — Encounter: Payer: PPO | Admitting: Nurse Practitioner

## 2019-07-26 ENCOUNTER — Encounter: Payer: Self-pay | Admitting: Family

## 2019-07-26 ENCOUNTER — Ambulatory Visit (INDEPENDENT_AMBULATORY_CARE_PROVIDER_SITE_OTHER): Payer: PPO | Admitting: Family

## 2019-07-26 ENCOUNTER — Telehealth: Payer: Self-pay | Admitting: Gastroenterology

## 2019-07-26 ENCOUNTER — Telehealth: Payer: Self-pay

## 2019-07-26 VITALS — BP 130/82 | HR 81 | Temp 97.7°F | Ht 69.0 in | Wt 209.4 lb

## 2019-07-26 DIAGNOSIS — E0580 Other thyrotoxicosis without thyrotoxic crisis or storm: Secondary | ICD-10-CM

## 2019-07-26 DIAGNOSIS — I25119 Atherosclerotic heart disease of native coronary artery with unspecified angina pectoris: Secondary | ICD-10-CM | POA: Diagnosis not present

## 2019-07-26 DIAGNOSIS — C8599 Non-Hodgkin lymphoma, unspecified, extranodal and solid organ sites: Secondary | ICD-10-CM

## 2019-07-26 DIAGNOSIS — Z125 Encounter for screening for malignant neoplasm of prostate: Secondary | ICD-10-CM

## 2019-07-26 DIAGNOSIS — E7800 Pure hypercholesterolemia, unspecified: Secondary | ICD-10-CM

## 2019-07-26 DIAGNOSIS — Z1211 Encounter for screening for malignant neoplasm of colon: Secondary | ICD-10-CM | POA: Diagnosis not present

## 2019-07-26 DIAGNOSIS — Z23 Encounter for immunization: Secondary | ICD-10-CM | POA: Diagnosis not present

## 2019-07-26 DIAGNOSIS — I482 Chronic atrial fibrillation, unspecified: Secondary | ICD-10-CM | POA: Diagnosis not present

## 2019-07-26 DIAGNOSIS — I10 Essential (primary) hypertension: Secondary | ICD-10-CM

## 2019-07-26 DIAGNOSIS — T462X5A Adverse effect of other antidysrhythmic drugs, initial encounter: Secondary | ICD-10-CM

## 2019-07-26 DIAGNOSIS — Z0000 Encounter for general adult medical examination without abnormal findings: Secondary | ICD-10-CM | POA: Diagnosis not present

## 2019-07-26 LAB — LIPID PANEL
Cholesterol: 227 mg/dL — ABNORMAL HIGH (ref 0–200)
HDL: 37 mg/dL — ABNORMAL LOW (ref >39.00)
LDL Cholesterol: 170 mg/dL — ABNORMAL HIGH (ref 0–99)
NonHDL: 190.28
Total CHOL/HDL Ratio: 6
Triglycerides: 102 mg/dL (ref 0.0–149.0)
VLDL: 20.4 mg/dL (ref 0.0–40.0)

## 2019-07-26 LAB — CBC WITH DIFFERENTIAL/PLATELET
Basophils Absolute: 0 10*3/uL (ref 0.0–0.1)
Basophils Relative: 0.6 % (ref 0.0–3.0)
Eosinophils Absolute: 0.2 10*3/uL (ref 0.0–0.7)
Eosinophils Relative: 2.8 % (ref 0.0–5.0)
HCT: 52.3 % — ABNORMAL HIGH (ref 39.0–52.0)
Hemoglobin: 18.1 g/dL (ref 13.0–17.0)
Lymphocytes Relative: 24.9 % (ref 12.0–46.0)
Lymphs Abs: 1.8 10*3/uL (ref 0.7–4.0)
MCHC: 34.6 g/dL (ref 30.0–36.0)
MCV: 96.7 fl (ref 78.0–100.0)
Monocytes Absolute: 0.5 10*3/uL (ref 0.1–1.0)
Monocytes Relative: 6.4 % (ref 3.0–12.0)
Neutro Abs: 4.8 10*3/uL (ref 1.4–7.7)
Neutrophils Relative %: 65.3 % (ref 43.0–77.0)
Platelets: 190 10*3/uL (ref 150.0–400.0)
RBC: 5.4 Mil/uL (ref 4.22–5.81)
RDW: 13.2 % (ref 11.5–15.5)
WBC: 7.3 10*3/uL (ref 4.0–10.5)

## 2019-07-26 LAB — COMPREHENSIVE METABOLIC PANEL
ALT: 13 U/L (ref 0–53)
AST: 17 U/L (ref 0–37)
Albumin: 3.9 g/dL (ref 3.5–5.2)
Alkaline Phosphatase: 107 U/L (ref 39–117)
BUN: 13 mg/dL (ref 6–23)
CO2: 29 mEq/L (ref 19–32)
Calcium: 9.2 mg/dL (ref 8.4–10.5)
Chloride: 101 mEq/L (ref 96–112)
Creatinine, Ser: 1.16 mg/dL (ref 0.40–1.50)
GFR: 61.62 mL/min (ref >60.00)
Glucose, Bld: 101 mg/dL — ABNORMAL HIGH (ref 70–99)
Potassium: 4.4 mEq/L (ref 3.5–5.1)
Sodium: 137 mEq/L (ref 135–145)
Total Bilirubin: 1 mg/dL (ref 0.2–1.2)
Total Protein: 7.2 g/dL (ref 6.0–8.3)

## 2019-07-26 LAB — TSH: TSH: 1.39 u[IU]/mL (ref 0.35–4.50)

## 2019-07-26 LAB — PSA, MEDICARE: PSA: 0.95 ng/ml (ref 0.10–4.00)

## 2019-07-26 MED ORDER — TRAZODONE HCL 50 MG PO TABS: ORAL_TABLET | ORAL | 3 refills | Status: DC

## 2019-07-26 NOTE — Telephone Encounter (Signed)
Please see phone note re: referral from PCP for colonoscopy.  Last colonoscopy 05/2011, history of colon lymphoma.  Patient has cardiac issues and is on anticoagulation - please make office appointment with me for comprehensive evaluation.

## 2019-07-26 NOTE — Telephone Encounter (Signed)
Dr. Loletha Carrow, there is a referral from Jodi Mourning, FNP to schedule pt for a colonoscopy.  Pt had previous EGD and colon in 2012 by Dr. Deatra Ina.  Recall was placed for July 2022.    Pt's PCP would like to relay: "Please make sure Dr. Loletha Carrow is aware that patient has a history of intestinal lymphoma. I couldn't find a colonoscopy in 2012- the last one I found was 2008. The endoscopies were done in 2012."  Please advise whether July 123456 recall is applicable.

## 2019-07-26 NOTE — Addendum Note (Signed)
Addended by: Marcina Millard on: 07/26/2019 10:18 AM   Modules accepted: Orders

## 2019-07-26 NOTE — Telephone Encounter (Signed)
CRITICAL VALUE STICKER  CRITICAL VALUE: 18.1 Hemoglobin  RECEIVER (on-site recipient of call):Tamara,RN  DATE & TIME NOTIFIED: 07/26/2019 10:46  MESSENGER (representative from lab):Karen/Lab  MD NOTIFIED: Jodi Mourning, NP  TIME OF NOTIFICATION:07/26/2019 10:47  RESPONSE: pending

## 2019-07-26 NOTE — Patient Instructions (Signed)
Please consider getting Shingrix as discussed;

## 2019-07-26 NOTE — Progress Notes (Signed)
Benjamin Mckay is a 73 y.o. male with the following history as recorded in EpicCare:  Patient Active Problem List   Diagnosis Date Noted  . Medicare annual wellness visit, subsequent 07/21/2017  . Routine adult health maintenance 07/21/2017  . Pleural effusion, malignant 10/02/2016  . Rotator cuff tendinitis 09/10/2016  . Ventricular tachycardia ([place]) 06/12/2016  . 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 Insomnia 03/08/2016  . Solitary pulmonary nodule 01/09/2016  . Hypercholesterolemia 12/12/2015  . History of sustained ventricular tachycardia 01/30/2015  . Chronic combined systolic (congestive) and diastolic (congestive) heart failure ([place]) 12/18/2014  . Warfarin anticoagulation 12/08/2014  . ARF (acute renal failure) ([place]) 12/07/2014  . Cardiomyopathy, ischemic 11/07/2013  . CHB (complete heart block) ([place]) 11/06/2013  . Single chamber St. Jude pacemaker 2013 11/06/2013  . Chronic atrial fibrillation ([place]) 01/29/2013  . Long term (current) use of anticoagulants 01/29/2013  . HTN (hypertension) 02/01/2012  . Carotid stenosis 02/01/2012  . Renal artery stenosis ([place]) 02/01/2012  . CAD (coronary artery disease) 02/01/2012  . Esophageal reflux 08/21/2011  . Hemorrhage of rectum and anus 04/26/2011  . HERNIA, UMBILICAL 56/25/6389  . UNSPECIFIED CARDIAC DYSRHYTHMIA 11/11/2007  . Transient cerebral ischemia 11/11/2007  . ESOPHAGEAL STRICTURE 11/11/2007  . DIVERTICULOSIS OF COLON 11/11/2007  . Non-Hodgkin's lymphoma of intestine ([place]) 09/03/2007    Current Outpatient Medications  Medication Sig Dispense Refill  . acetaminophen (TYLENOL) 500 MG tablet Take 1,000 mg by mouth every 6 (six) hours as needed for moderate pain.     . cetirizine (ZYRTEC) 10 MG tablet Take 1 tablet (10 mg total) by mouth daily. (Patient taking differently: Take 10 mg by mouth as needed. ) 30 tablet 11  . furosemide (LASIX) 20 MG tablet TAKE 1 TO 2 TABLETS BY MOUTH DAILY AS DIRECTED 180 tablet 1   . metoprolol succinate (TOPROL-XL) 25 MG 24 hr tablet Take 1 tablet (25 mg total) by mouth 2 (two) times daily. 180 tablet 3  . pravastatin (PRAVACHOL) 80 MG tablet TAKE 1 TABLET BY MOUTH EVERY DAY 90 tablet 1  . Tamsulosin HCl (FLOMAX) 0.4 MG CAPS Take 0.4 mg by mouth at bedtime.     . traZODone (DESYREL) 50 MG tablet TAKE 1/2-1 TABLET BY MOUTH AT BEDTIME AS NEEDED FOR SLEEP 90 tablet 3  . triamcinolone cream (KENALOG) 0.1 % APPLY ON THE SKIN TWICE A DAY AS NEEDED    . warfarin (COUMADIN) 2.5 MG tablet TAKE 1 TO 1.5 TABLETS BY MOUTH DAILY AS DIRECTED BY COUMADIN CLINIC 135 tablet 1   No current facility-administered medications for this visit.     Allergies: Statins, Adhesive [tape], Latex, and Penicillins  Past Medical History:  Diagnosis Date  . Anxiety   . Atrial fibrillation ([place])   . Cardiomyopathy, ischemic 11/07/2013  . Cataract   . CHB (complete heart block) ([place]) 11/06/2013  . Chronic combined systolic and diastolic CHF (congestive heart failure) ([place])   . Colon polyps   . Colon polyps   . Congenital heart block   . Constipation   . CVA (cerebral infarction)   . Depression   . Diverticulosis   . Esophageal stricture   . Family history of adverse reaction to anesthesia   . Hyperlipidemia   . Hypertension   . Hyperthyroidism 11/16/2015  . Lymphoma ([place]) 1998   of colon   . Presence of permanent cardiac pacemaker    St. Jude  pt.states he is totally dependent on pacemaker  . Shortness of breath dyspnea    Pulmonary Effusion  .  Stroke [facility])    [id number] TIA  . Umbilical hernia   . Urinary frequency     Past Surgical History:  Procedure Laterality Date  . APPENDECTOMY  1953  . CARDIAC CATHETERIZATION  01/06/2003   Recommend medical therapy  . CARDIOVASCULAR STRESS TEST  07/16/2012   Mild-moderate perfusion defect seen in Basal inferior, Mid inferior, and Apica lateral consistent with infarct/scar. No scintigraphic evidence for inducible myocardial ischemia. No ECG  changes. EKG negative for ischemia.  Marland Kitchen CAROTID DOPPLER  11/04/2012   Proximal Rt ICA 50-99% diameter reduction; Lft Bulb demonstrated mild amount homogeneous plaque-not hemodynamically significant; Lft ICA-normal patency.  . CHEST TUBE INSERTION Right 03/15/2016   Procedure: INSERTION PLEURAL DRAINAGE CATHETER;  Surgeon: Ivin Poot, MD;  Location: [place];  Service: Thoracic;  Laterality: Right;  . COLECTOMY     for lymphoma  . COLONOSCOPY    . neck fusion    . PACEMAKER GENERATOR CHANGE  01/31/2012   St Jude Med Accent DR RF model [id number] serial [id number]  . PACEMAKER PLACEMENT     replaced 3 x  . PERMANENT PACEMAKER GENERATOR CHANGE N/A 01/31/2012   Procedure: PERMANENT PACEMAKER GENERATOR CHANGE;  Surgeon: Sanda Klein, MD;  Location: [place] CATH LAB;   . PLEURADESIS N/A 04/02/2016   Procedure: PLEURADESIS;  Surgeon: Ivin Poot, MD;  Location: [place];  Service: Thoracic;  Laterality: N/A;  . REMOVAL OF PLEURAL DRAINAGE CATHETER Right 07/26/2016   Procedure: REMOVAL OF PLEURAL DRAINAGE CATHETER;  Surgeon: Ivin Poot, MD;  Location: [place];  Service: Thoracic;  Laterality: Right;  . TONSILLECTOMY    . TRANSTHORACIC ECHOCARDIOGRAM  11/04/2012   EF 09-47%, systolic function moderately reduced, mild regurg of the aortic and mitral valves.  Marland Kitchen VIDEO ASSISTED THORACOSCOPY Right 04/02/2016   Procedure: Right VIDEO ASSISTED THORACOSCOPY with Biopsies and drainage pleural effusion;  Surgeon: Ivin Poot, MD;  Location: [facility] OR;  Service: Thoracic;  Laterality: Right;    Family History  Problem Relation Age of Onset  . Prostate cancer Father   . Heart disease Mother   . Stroke Mother   . Diabetes Maternal Grandmother   . Colon cancer Neg Hx   . Heart attack Neg Hx     Social History   Tobacco Use  . Smoking status: Never Smoker  . Smokeless tobacco: Never Used  Substance Use Topics  . Alcohol use: No    Alcohol/week: 0.0 standard drinks    Subjective:  Patient presents for yearly  CPE; majority of healthcare needs are managed by his cardiologist; needs refill on Trazodone; would like to get flu shot updated today; Up to date on dentist and eye exam today; Overdue for colonoscopy;  Health Maintenance  Topic Date Due  . INFLUENZA VACCINE  05/15/2019  . TETANUS/TDAP  07/25/2020 (Originally 12/21/1964)  . COLONOSCOPY  05/14/2021  . Hepatitis C Screening  Completed  . PNA vac Low Risk Adult  Completed   Review of Systems  Constitutional: Negative.   HENT: Negative.   Eyes: Negative.   Respiratory: Negative.   Cardiovascular: Negative.   Gastrointestinal: Negative.   Genitourinary: Negative.   Musculoskeletal: Negative.   Skin: Negative.   Neurological: Negative.   Endo/Heme/Allergies: Negative.   Psychiatric/Behavioral: Negative.      Objective:  Vitals:   07/26/19 0917  BP: 130/82  Pulse: 81  Temp: 97.7 F (36.5 C)  TempSrc: Oral  SpO2: 96%  Weight: 209 lb 6.4 oz (95 kg)  Height: 5' 9" (1.753  m)    General: Well developed, well nourished, in no acute distress  Skin : Warm and dry.  Head: Normocephalic and atraumatic  Eyes: Sclera and conjunctiva clear; pupils round and reactive to light; extraocular movements intact  Ears: External normal; canals clear; tympanic membranes normal  Oropharynx: Pink, supple. No suspicious lesions  Neck: Supple without thyromegaly, adenopathy  Lungs: Respirations unlabored; clear to auscultation bilaterally without wheeze, rales, rhonchi  CVS exam: normal rate and regular rhythm.  Abdomen: Soft; nontender; nondistended; normoactive bowel sounds; no masses or hepatosplenomegaly  Musculoskeletal: No deformities; no active joint inflammation  Extremities: No edema, cyanosis, clubbing  Vessels: Symmetric bilaterally  Neurologic: Alert and oriented; speech intact; face symmetrical; moves all extremities well; CNII-XII intact without focal deficit  Assessment:  1. Routine general medical examination at a health care  facility   2. Encounter for screening colonoscopy   3. Non-Hodgkin's lymphoma of intestine ([place])   4. Chronic atrial fibrillation (HCC)   5. Essential hypertension   6. Hypercholesterolemia   7. Prostate cancer screening   8. Amiodarone-induced hyperthyroidism     Plan:  Age appropriate preventive healthcare needs addressed; encouraged regular eye doctor and dental exams; encouraged regular exercise; will update labs and refills as needed today; follow-up to be determined; Continue with cardiology as scheduled; order for colonoscopy updated;  Follow up in 1 year, sooner prn. Flu shot updated; encouraged to get Shingrix at pharmacy; patient defers Tdap;    No follow-ups on file.  Orders Placed This Encounter  Procedures  . CBC w/Diff    Standing Status:   Future    Standing Expiration Date:   07/25/2020  . Comp Met (CMET)    Standing Status:   Future    Standing Expiration Date:   07/25/2020  . Lipid panel    Standing Status:   Future    Standing Expiration Date:   07/25/2020  . PSA, Medicare    Standing Status:   Future    Standing Expiration Date:   07/25/2020  . TSH    Standing Status:   Future    Standing Expiration Date:   07/25/2020  . Ambulatory referral to Gastroenterology    Referral Priority:   Routine    Referral Type:   Consultation    Referral Reason:   Specialty Services Required    Number of Visits Requested:   1    Requested Prescriptions   Signed Prescriptions Disp Refills  . traZODone (DESYREL) 50 MG tablet 90 tablet 3    Sig: TAKE 1/2-1 TABLET BY MOUTH AT BEDTIME AS NEEDED FOR SLEEP

## 2019-07-27 ENCOUNTER — Other Ambulatory Visit: Payer: Self-pay | Admitting: Family

## 2019-07-27 DIAGNOSIS — D582 Other hemoglobinopathies: Secondary | ICD-10-CM

## 2019-07-27 NOTE — Telephone Encounter (Signed)
Spoke with the Benjamin Mckay, scheduled the Benjamin Mckay for a comprehensive evaluation with Dr. Loletha Carrow in the office on Tuesday 11/3 at 1:20 pm.

## 2019-08-02 ENCOUNTER — Other Ambulatory Visit (INDEPENDENT_AMBULATORY_CARE_PROVIDER_SITE_OTHER): Payer: PPO

## 2019-08-02 DIAGNOSIS — D582 Other hemoglobinopathies: Secondary | ICD-10-CM

## 2019-08-02 DIAGNOSIS — D229 Melanocytic nevi, unspecified: Secondary | ICD-10-CM | POA: Diagnosis not present

## 2019-08-02 DIAGNOSIS — L821 Other seborrheic keratosis: Secondary | ICD-10-CM | POA: Diagnosis not present

## 2019-08-02 DIAGNOSIS — Z85828 Personal history of other malignant neoplasm of skin: Secondary | ICD-10-CM | POA: Diagnosis not present

## 2019-08-02 DIAGNOSIS — D485 Neoplasm of uncertain behavior of skin: Secondary | ICD-10-CM | POA: Diagnosis not present

## 2019-08-02 LAB — CBC WITH DIFFERENTIAL/PLATELET
Basophils Absolute: 0 10*3/uL (ref 0.0–0.1)
Basophils Relative: 0.5 % (ref 0.0–3.0)
Eosinophils Absolute: 0.2 10*3/uL (ref 0.0–0.7)
Eosinophils Relative: 3 % (ref 0.0–5.0)
HCT: 47 % (ref 39.0–52.0)
Hemoglobin: 16 g/dL (ref 13.0–17.0)
Lymphocytes Relative: 23 % (ref 12.0–46.0)
Lymphs Abs: 1.6 10*3/uL (ref 0.7–4.0)
MCHC: 33.9 g/dL (ref 30.0–36.0)
MCV: 97 fl (ref 78.0–100.0)
Monocytes Absolute: 0.4 10*3/uL (ref 0.1–1.0)
Monocytes Relative: 6.1 % (ref 3.0–12.0)
Neutro Abs: 4.7 10*3/uL (ref 1.4–7.7)
Neutrophils Relative %: 67.4 % (ref 43.0–77.0)
Platelets: 167 10*3/uL (ref 150.0–400.0)
RBC: 4.85 Mil/uL (ref 4.22–5.81)
RDW: 13.7 % (ref 11.5–15.5)
WBC: 6.9 10*3/uL (ref 4.0–10.5)

## 2019-08-17 ENCOUNTER — Ambulatory Visit: Payer: PPO | Admitting: Gastroenterology

## 2019-08-17 ENCOUNTER — Telehealth: Payer: Self-pay | Admitting: *Deleted

## 2019-08-17 ENCOUNTER — Encounter: Payer: Self-pay | Admitting: Gastroenterology

## 2019-08-17 VITALS — BP 140/82 | HR 76 | Temp 97.8°F | Ht 69.0 in | Wt 212.6 lb

## 2019-08-17 DIAGNOSIS — Z7901 Long term (current) use of anticoagulants: Secondary | ICD-10-CM | POA: Diagnosis not present

## 2019-08-17 DIAGNOSIS — Z1159 Encounter for screening for other viral diseases: Secondary | ICD-10-CM

## 2019-08-17 DIAGNOSIS — C8599 Non-Hodgkin lymphoma, unspecified, extranodal and solid organ sites: Secondary | ICD-10-CM

## 2019-08-17 MED ORDER — NA SULFATE-K SULFATE-MG SULF 17.5-3.13-1.6 GM/177ML PO SOLN: 1.0000 | Freq: Once | ORAL | 0 refills | Status: AC

## 2019-08-17 NOTE — Telephone Encounter (Signed)
I have sent pharmacy recommendations for Lovenox bridge to our [facility] team. They follow his Coumadin. He has an appointment 11/9. They should be aware and following this.

## 2019-08-17 NOTE — Progress Notes (Signed)
[facility] Consult Note:  History: Benjamin Mckay [facility] 08/17/2019  Referring provider: [name], [place]  Reason for consult/chief complaint: Colon Cancer Screening ( Routine recall, patient has no compliants today; hx of intestinal lymphoma; patient taking Coumadin)   Subjective  HPI: Colonic lymphoma diagnosed 1998 treated with sigmoid resection and chemotherapy.  No evidence of endoscopic recurrence in 1998, 2002, 2008, 2012. In 2012, suspected cecal polyp was benign lymphoid aggregate.  EGD at that time showed mild distal esophagitis, mild prepyloric antral erythema biopsy negative for H. pylori.  18 mm bougie dilation performed.  Cardiology office note from 01/06/2019 by Dr. Sallyanne Kuster was reviewed.  Patient has complete heart block with pacemaker, atrial fibrillation on Coumadin.  Coronary artery disease with calcifications on imaging, no prior cardiac cath., renal and carotid artery stenosis.  Patient reportedly feeling well then intermittent dizzy spells.  NYHA functional class I-II, CHADSVasc 4.  Ever since his sigmoid resection he has had a change in bowel habits where he will go every 1 to 2 days, but cannot sense if he is completely evacuated.  He has intermittent painless bleeding that he is attributed to hemorrhoids.  Denies chronic abdominal pain, dysphagia, odynophagia, nausea vomiting or weight loss.   ROS:  Review of Systems  Constitutional: Negative for appetite change and unexpected weight change.  HENT: Negative for mouth sores and voice change.   Eyes: Negative for pain and redness.  Respiratory: Negative for cough and shortness of breath.   Cardiovascular: Negative for chest pain and palpitations.  Genitourinary: Negative for dysuria and hematuria.  Musculoskeletal: Negative for arthralgias and myalgias.  Skin: Negative for pallor and rash.  Neurological: Positive for dizziness. Negative for weakness and headaches.  Hematological:  Negative for adenopathy.     Past Medical History: Past Medical History:  Diagnosis Date  . Anxiety   . Atrial fibrillation ([place])   . Cardiomyopathy, ischemic 11/07/2013  . Cataract   . CHB (complete heart block) ([place]) 11/06/2013  . Chronic combined systolic and diastolic CHF (congestive heart failure) ([place])   . Colon polyps   . Colon polyps   . Congenital heart block   . Constipation   . CVA (cerebral infarction)   . Depression   . Diverticulosis   . Esophageal stricture   . Family history of adverse reaction to anesthesia   . Hyperlipidemia   . Hypertension   . Hyperthyroidism 11/16/2015  . Lymphoma ([place]) 1998   of colon   . Presence of permanent cardiac pacemaker    St. Jude  pt.states he is totally dependent on pacemaker  . Shortness of breath dyspnea    Pulmonary Effusion  . Stroke [facility])    [id number] TIA  . Umbilical hernia   . Urinary frequency      Past Surgical History: Past Surgical History:  Procedure Laterality Date  . APPENDECTOMY  1953  . CARDIAC CATHETERIZATION  01/06/2003   Recommend medical therapy  . CARDIOVASCULAR STRESS TEST  07/16/2012   Mild-moderate perfusion defect seen in Basal inferior, Mid inferior, and Apica lateral consistent with infarct/scar. No scintigraphic evidence for inducible myocardial ischemia. No ECG changes. EKG negative for ischemia.  Marland Kitchen CAROTID DOPPLER  11/04/2012   Proximal Rt ICA 50-99% diameter reduction; Lft Bulb demonstrated mild amount homogeneous plaque-not hemodynamically significant; Lft ICA-normal patency.  . CHEST TUBE INSERTION Right 03/15/2016   Procedure: INSERTION PLEURAL DRAINAGE CATHETER;  Surgeon: Ivin Poot, MD;  Location: [place];  Service: Thoracic;  Laterality: Right;  .  COLECTOMY     for lymphoma  . COLONOSCOPY    . neck fusion    . PACEMAKER GENERATOR CHANGE  01/31/2012   St Jude Med Accent DR RF model [id number] serial [id number]  . PACEMAKER PLACEMENT     replaced 3 x  . PERMANENT PACEMAKER GENERATOR CHANGE  N/A 01/31/2012   Procedure: PERMANENT PACEMAKER GENERATOR CHANGE;  Surgeon: Sanda Klein, MD;  Location: [place] CATH LAB;   . PLEURADESIS N/A 04/02/2016   Procedure: PLEURADESIS;  Surgeon: Ivin Poot, MD;  Location: [place];  Service: Thoracic;  Laterality: N/A;  . REMOVAL OF PLEURAL DRAINAGE CATHETER Right 07/26/2016   Procedure: REMOVAL OF PLEURAL DRAINAGE CATHETER;  Surgeon: Ivin Poot, MD;  Location: [place];  Service: Thoracic;  Laterality: Right;  . TONSILLECTOMY    . TRANSTHORACIC ECHOCARDIOGRAM  11/04/2012   EF 123456, systolic function moderately reduced, mild regurg of the aortic and mitral valves.  Marland Kitchen VIDEO ASSISTED THORACOSCOPY Right 04/02/2016   Procedure: Right VIDEO ASSISTED THORACOSCOPY with Biopsies and drainage pleural effusion;  Surgeon: Ivin Poot, MD;  Location: [facility] OR;  Service: Thoracic;  Laterality: Right;     Family History: Family History  Problem Relation Age of Onset  . Prostate cancer Father   . Heart disease Mother   . Stroke Mother   . Diabetes Maternal Grandmother   . Colon cancer Neg Hx   . Heart attack Neg Hx     Social History: Social History   Socioeconomic History  . Marital status: Married    Spouse name: Chartered certified accountant  . Number of children: 3  . Years of education: 60  . Highest education level: Not on file  Occupational History  . Occupation: Retired  Scientific laboratory technician  . Financial resource strain: Not on file  . Food insecurity    Worry: Not on file    Inability: Not on file  . Transportation needs    Medical: Not on file    Non-medical: Not on file  Tobacco Use  . Smoking status: Never Smoker  . Smokeless tobacco: Never Used  Substance and Sexual Activity  . Alcohol use: No    Alcohol/week: 0.0 standard drinks  . Drug use: No  . Sexual activity: Yes  Lifestyle  . Physical activity    Days per week: Not on file    Minutes per session: Not on file  . Stress: Not on file  Relationships  . Social Herbalist on  phone: Not on file    Gets together: Not on file    Attends religious service: Not on file    Active member of club or organization: Not on file    Attends meetings of clubs or organizations: Not on file    Relationship status: Not on file  Other Topics Concern  . Not on file  Social History Narrative   Fun: Golf when he can   Consumes caffeine 2 cups per day      Allergies: Allergies  Allergen Reactions  . Statins Other (See Comments)    Leg cramps.   Tolerates pravastatin.    . Adhesive [Tape] Other (See Comments)    Skin irritation - please use paper tape  . Latex Other (See Comments)    Skin irritation  . Penicillins Rash    Has patient had a PCN reaction causing immediate rash, facial/tongue/throat swelling, SOB or lightheadedness with hypotension: Yes Has patient had a PCN reaction causing severe rash involving mucus membranes  or skin necrosis: No Has patient had a PCN reaction that required hospitalization No Has patient had a PCN reaction occurring within the last 10 years: No If all of the above answers are "NO", then may proceed with Cephalosporin use.    Outpatient Meds: Current Outpatient Medications  Medication Sig Dispense Refill  . acetaminophen (TYLENOL) 500 MG tablet Take 1,000 mg by mouth every 6 (six) hours as needed for moderate pain.     . cetirizine (ZYRTEC) 10 MG tablet Take 1 tablet (10 mg total) by mouth daily. (Patient taking differently: Take 10 mg by mouth as needed. ) 30 tablet 11  . furosemide (LASIX) 20 MG tablet TAKE 1 TO 2 TABLETS BY MOUTH DAILY AS DIRECTED 180 tablet 1  . metoprolol succinate (TOPROL-XL) 25 MG 24 hr tablet Take 1 tablet (25 mg total) by mouth 2 (two) times daily. 180 tablet 3  . pravastatin (PRAVACHOL) 80 MG tablet TAKE 1 TABLET BY MOUTH EVERY DAY 90 tablet 1  . Tamsulosin HCl (FLOMAX) 0.4 MG CAPS Take 0.4 mg by mouth at bedtime.     . traZODone (DESYREL) 50 MG tablet TAKE 1/2-1 TABLET BY MOUTH AT BEDTIME AS NEEDED FOR SLEEP 90  tablet 3  . triamcinolone cream (KENALOG) 0.1 % APPLY ON THE SKIN TWICE A DAY AS NEEDED    . warfarin (COUMADIN) 2.5 MG tablet TAKE 1 TO 1.5 TABLETS BY MOUTH DAILY AS DIRECTED BY COUMADIN CLINIC 135 tablet 1   No current facility-administered medications for this visit.       ___________________________________________________________________ Objective   Exam:  BP 140/82   Pulse 76   Temp 97.8 F (36.6 C)   Ht 5\' 9"  (1.753 m)   Wt 212 lb 9.6 oz (96.4 kg)   BMI 31.40 kg/m    General: Well-appearing  Eyes: sclera anicteric, no redness  ENT: oral mucosa moist without lesions, no cervical or supraclavicular lymphadenopathy  CV: RRR without murmur, S1/split S2, no JVD, trace peripheral edema  Resp: clear to auscultation bilaterally, normal RR and effort noted  GI: soft, no tenderness, with active bowel sounds. No guarding or palpable organomegaly noted.  Skin; warm and dry, no rash or jaundice noted  Neuro: awake, alert and oriented x 3. Normal gross motor function and fluent speech  Labs:  CBC Latest Ref Rng & Units 08/02/2019 07/26/2019 07/23/2018  WBC 4.0 - 10.5 K/uL 6.9 7.3 7.0  Hemoglobin 13.0 - 17.0 g/dL 16.0 18.1 Repeated and verified X2.(HH) 16.8  Hematocrit 39.0 - 52.0 % 47.0 52.3(H) 48.1  Platelets 150.0 - 400.0 K/uL 167.0 190.0 166.0   CMP Latest Ref Rng & Units 07/26/2019 07/23/2018 07/21/2017  Glucose 70 - 99 mg/dL 101(H) 100(H) 104(H)  BUN 6 - 23 mg/dL 13 11 13   Creatinine 0.40 - 1.50 mg/dL 1.16 1.02 0.90  Sodium 135 - 145 mEq/L 137 140 138  Potassium 3.5 - 5.1 mEq/L 4.4 3.9 3.9  Chloride 96 - 112 mEq/L 101 104 103  CO2 19 - 32 mEq/L 29 29 27   Calcium 8.4 - 10.5 mg/dL 9.2 8.7 9.4  Total Protein 6.0 - 8.3 g/dL 7.2 7.0 7.5  Total Bilirubin 0.2 - 1.2 mg/dL 1.0 1.1 1.5(H)  Alkaline Phos 39 - 117 U/L 107 85 99  AST 0 - 37 U/L 17 13 11   ALT 0 - 53 U/L 13 9 9      Radiologic Studies:  Last echocardiogram February 2017:  Left ventricle: The cavity  size was normal. There was mild  concentric hypertrophy. Systolic function was mildly reduced. The   estimated ejection fraction was in the range of 45% to 50%. There   is akinesis of the anteroseptal myocardium. Doppler parameters   are consistent with high ventricular filling pressure. - Aortic valve: There was mild regurgitation. - Mitral valve: Calcified annulus. - Right ventricle: Pacer wire or catheter noted in right ventricle. - Right atrium: The atrium was mildly dilated. - Pulmonary arteries: Systolic pressure was moderately increased.   PA peak pressure: 54 mm Hg (S). - Pericardium, extracardiac: There was a moderate-sized left   pleural effusion.   Assessment: Encounter Diagnoses  Name Primary?  . Non-Hodgkin's lymphoma of intestine ([place]) Yes  . Current use of long term anticoagulation     There are no existing recommendations on the appropriate interval surveillance colonoscopy history of colonic lymphoma.  If it is been adenocarcinoma, 5-year interval would be appropriate.  With no history of a colon malignancy at 10-year interval would be appropriate.  So I think the current 8-year interval seems a fair compromise.  He is agreeable after discussion of procedure and risks.  The benefits and risks of the planned procedure were described in detail with the patient or (when appropriate) their health care proxy.  Risks were outlined as including, but not limited to, bleeding, infection, perforation, adverse medication reaction leading to cardiac or pulmonary decompensation, pancreatitis (if ERCP).  The limitation of incomplete mucosal visualization was also discussed.  No guarantees or warranties were given.  Patient at increased risk for cardiopulmonary complications of procedure due to medical comorbidities.  He will be off Coumadin 4 days prior, which will most likely be acceptable to his cardiologist.  We will message them about this to be sure.  If no polyps or perhaps  only a few diminutive polyps on upcoming procedure, he would not likely need any future surveillance colonoscopy.  Thank you for the courtesy of this consult.  Please call me with any questions or concerns.  Nelida Meuse III  CC: Referring provider noted above

## 2019-08-17 NOTE — Patient Instructions (Addendum)
If you are age 73 or older, your body mass index should be between 23-30. Your Body mass index is 31.4 kg/m. If this is out of the aforementioned range listed, please consider follow up with your Primary Care Provider.  If you are age 62 or younger, your body mass index should be between 19-25. Your Body mass index is 31.4 kg/m. If this is out of the aformentioned range listed, please consider follow up with your Primary Care Provider.   You have been scheduled for a colonoscopy. Please follow written instructions given to you at your visit today.  Please pick up your prep supplies at the pharmacy within the next 1-3 days. If you use inhalers (even only as needed), please bring them with you on the day of your procedure. Your physician has requested that you go to www.startemmi.com and enter the access code given to you at your visit today. This web site gives a general overview about your procedure. However, you should still follow specific instructions given to you by our office regarding your preparation for the procedure.  You will be contaced by our office prior to your procedure for directions on holding your Coumadin/Warfarin.  If you do not hear from our office 1 week prior to your scheduled procedure, please call [phone] to discuss.  It was a pleasure to see you today!  Dr. Loletha Carrow

## 2019-08-17 NOTE — Telephone Encounter (Signed)
   Primary Cardiologist: Sanda Klein, MD  Chart reviewed as part of pre-operative protocol coverage. Given past medical history and time since last visit, based on ACC/AHA guidelines, MUKUL KRAHMER would be at acceptable risk for the planned procedure without further cardiovascular testing.   Patient with diagnosis of afib on warfarin for anticoagulation.    Procedure: Colonoscopy Date of procedure: 09/22/2019  CHADS2-VASc score of  6 (CHF, HTN, AGE, stroke/tia x 2, CAD)  CrCl 64 ml/min Platelet count 167  Per office protocol, patient can hold warfarin for 4 days prior to procedure.    Patient WILL need bridging with Lovenox (enoxaparin) around procedure.  I will route this recommendation to the requesting party via Epic fax function and remove from pre-op pool.  Please call with questions.  Kathyrn Drown, NP 08/17/2019, 3:35 PM

## 2019-08-17 NOTE — Telephone Encounter (Signed)
Who will set patient up with Lovenox? We do not manage Lovenox regimen in our clinic. Please advise.

## 2019-08-17 NOTE — Telephone Encounter (Signed)
Patient with diagnosis of afib on warfarin for anticoagulation.    Procedure: Colonoscopy Date of procedure: 09/22/2019  CHADS2-VASc score of  6 (CHF, HTN, AGE, stroke/tia x 2, CAD)  CrCl 64 ml/min Platelet count 167  Per office protocol, patient can hold warfarin for 4 days prior to procedure.    Patient WILL need bridging with Lovenox (enoxaparin) around procedure.

## 2019-08-17 NOTE — Telephone Encounter (Signed)
Will bridge at next INR appt.

## 2019-08-17 NOTE — Telephone Encounter (Signed)
[facility] Pre-operative Risk Assessment     Request for surgical clearance:     Endoscopy Procedure  What type of surgery is being performed?     Colonoscopy  When is this surgery scheduled?     09/22/2019   What type of clearance is required ?   Pharmacy  Are there any medications that need to be held prior to surgery and how long? Coumadin 4 days prior to procedure.   Practice name and name of physician performing surgery?      [facility]  What is your office phone and fax number?      Phone- [phone]  [phone]  Anesthesia type (None, local, MAC, general) ?       MAC

## 2019-08-23 ENCOUNTER — Ambulatory Visit (INDEPENDENT_AMBULATORY_CARE_PROVIDER_SITE_OTHER): Payer: PPO | Admitting: Pharmacist

## 2019-08-23 ENCOUNTER — Other Ambulatory Visit: Payer: Self-pay

## 2019-08-23 DIAGNOSIS — I482 Chronic atrial fibrillation, unspecified: Secondary | ICD-10-CM | POA: Diagnosis not present

## 2019-08-23 DIAGNOSIS — Z7901 Long term (current) use of anticoagulants: Secondary | ICD-10-CM

## 2019-08-23 LAB — POCT INR: INR: 4.1 — AB (ref 2.0–3.0)

## 2019-08-23 NOTE — Patient Instructions (Addendum)
Dec/4: Last dose of Coumadin.  Dec/5: No Coumadin or Lovenox.  Dec/6: Inject Lovenox 100mg  in the fatty abdominal tissue at least 2 inches from the belly button twice a day about 12 hours apart, 8am and 8pm rotate sites. No Coumadin.  Dec/7: Inject Lovenox in the fatty tissue every 12 hours, 8am and 8pm. No Coumadin.  Dec/8: Inject Lovenox in the fatty tissue morning ONLY. No Coumadin.  Dec/9: Procedure Day - No Lovenox - Resume Coumadin in the evening or as directed by doctor  Dec/10: Resume Lovenox inject in the fatty tissue every 12 hours and take Coumadin 5mg .  Dec/11: Inject Lovenox in the fatty tissue every 12 hours and take Coumadin 5mg .  Dec/12: Inject Lovenox in the fatty tissue every 12 hours and take Coumadin 3.75mg .  Dec/13: Inject Lovenox in the fatty tissue every 12 hours and take Coumadin 3.75.  Dec/14: Coumadin appt to check INR at @ 9:30am .

## 2019-08-23 NOTE — Progress Notes (Signed)
Dec/4: Last dose of Coumadin.  Dec/5: No Coumadin or Lovenox.  Dec/6: Inject Lovenox 100mg  in the fatty abdominal tissue at least 2 inches from the belly button twice a day about 12 hours apart, 8am and 8pm rotate sites. No Coumadin.  Dec/7: Inject Lovenox in the fatty tissue every 12 hours, 8am and 8pm. No Coumadin.  Dec/8: Inject Lovenox in the fatty tissue every 12 hours, 8am and 8pm. No Coumadin.  Dec/9: Procedure Day - No Lovenox - Resume Coumadin in the evening or as directed by doctor  Dec/10: Resume Lovenox inject in the fatty tissue every 12 hours and take Coumadin 5mg .  Dec/11: Inject Lovenox in the fatty tissue every 12 hours and take Coumadin 5mg .  Dec/12: Inject Lovenox in the fatty tissue every 12 hours and take Coumadin 3.75mg .  Dec/13: Inject Lovenox in the fatty tissue every 12 hours and take Coumadin 3.75.  Dec/14: Coumadin appt to check INR at @ 9:30am .

## 2019-09-01 ENCOUNTER — Ambulatory Visit (INDEPENDENT_AMBULATORY_CARE_PROVIDER_SITE_OTHER): Payer: PPO | Admitting: *Deleted

## 2019-09-01 DIAGNOSIS — I472 Ventricular tachycardia, unspecified: Secondary | ICD-10-CM

## 2019-09-01 DIAGNOSIS — I442 Atrioventricular block, complete: Secondary | ICD-10-CM

## 2019-09-01 LAB — CUP PACEART REMOTE DEVICE CHECK
Battery Remaining Longevity: 77 mo
Battery Remaining Percentage: 65 %
Battery Voltage: 2.9 V
Brady Statistic RV Percent Paced: 61 %
Date Time Interrogation Session: 20201118084531
Implantable Lead Implant Date: 20130419
Implantable Lead Location: 753860
Implantable Pulse Generator Implant Date: 20130419
Lead Channel Impedance Value: 410 Ohm
Lead Channel Pacing Threshold Amplitude: 0.75 V
Lead Channel Pacing Threshold Pulse Width: 0.5 ms
Lead Channel Sensing Intrinsic Amplitude: 10 mV
Lead Channel Setting Pacing Amplitude: 2.5 V
Lead Channel Setting Pacing Pulse Width: 0.5 ms
Lead Channel Setting Sensing Sensitivity: 2.5 mV
Pulse Gen Model: 1210
Pulse Gen Serial Number: 7313697

## 2019-09-08 ENCOUNTER — Other Ambulatory Visit: Payer: Self-pay

## 2019-09-14 NOTE — Telephone Encounter (Signed)
Spoke to Benjamin Mckay. He has his schedule for Coumadin and Lovenox given by the Coumadin clinic.

## 2019-09-15 ENCOUNTER — Encounter: Payer: Self-pay | Admitting: Gastroenterology

## 2019-09-17 ENCOUNTER — Ambulatory Visit (INDEPENDENT_AMBULATORY_CARE_PROVIDER_SITE_OTHER): Payer: PPO

## 2019-09-17 ENCOUNTER — Other Ambulatory Visit: Payer: Self-pay | Admitting: Gastroenterology

## 2019-09-17 DIAGNOSIS — Z1159 Encounter for screening for other viral diseases: Secondary | ICD-10-CM | POA: Diagnosis not present

## 2019-09-17 LAB — SARS CORONAVIRUS 2 (TAT 6-24 HRS): SARS Coronavirus 2: NEGATIVE

## 2019-09-22 ENCOUNTER — Ambulatory Visit (AMBULATORY_SURGERY_CENTER): Payer: PPO | Admitting: Gastroenterology

## 2019-09-22 ENCOUNTER — Encounter: Payer: Self-pay | Admitting: Gastroenterology

## 2019-09-22 ENCOUNTER — Other Ambulatory Visit: Payer: Self-pay

## 2019-09-22 VITALS — BP 129/79 | HR 92 | Temp 97.7°F | Resp 24 | Ht 69.0 in | Wt 212.0 lb

## 2019-09-22 DIAGNOSIS — Z85038 Personal history of other malignant neoplasm of large intestine: Secondary | ICD-10-CM

## 2019-09-22 DIAGNOSIS — D122 Benign neoplasm of ascending colon: Secondary | ICD-10-CM

## 2019-09-22 DIAGNOSIS — Z1211 Encounter for screening for malignant neoplasm of colon: Secondary | ICD-10-CM | POA: Diagnosis not present

## 2019-09-22 MED ORDER — SODIUM CHLORIDE 0.9 % IV SOLN: 500.0000 mL | Freq: Once | INTRAVENOUS | Status: DC

## 2019-09-22 NOTE — Patient Instructions (Signed)
HANDOUTS PROVIDED ON: POLYPS & DIVERTICULOSIS  THE POLYPS REMOVED TODAY HAVE BEEN SENT FOR PATHOLOGY.  THE RESULTS CAN TAKE 2-3 WEEKS TO RECEIVE.    YOU MAY RESUME YOUR PREVIOUS DIET AND MEDICATION SCHEDULE.  YOU MAY TAKE YOUR COUMADIC (WARFARIN) AND LOVENOX (ENOXAPARIN) TODAY AS WELL.  Benjamin Mckay YOU FOR ALLOWING [place] TO CARE FOR YOU TODAY!!!  YOU HAD AN ENDOSCOPIC PROCEDURE TODAY AT [facility]:   Refer to the procedure report that was given to you for any specific questions about what was found during the examination.  If the procedure report does not answer your questions, please call your gastroenterologist to clarify.  If you requested that your care partner not be given the details of your procedure findings, then the procedure report has been included in a sealed envelope for you to review at your convenience later.  YOU SHOULD EXPECT: Some feelings of bloating in the abdomen. Passage of more gas than usual.  Walking can help get rid of the air that was put into your GI tract during the procedure and reduce the bloating. If you had a lower endoscopy (such as a colonoscopy or flexible sigmoidoscopy) you may notice spotting of blood in your stool or on the toilet paper. If you underwent a bowel prep for your procedure, you may not have a normal bowel movement for a few days.  Please Note:  You might notice some irritation and congestion in your nose or some drainage.  This is from the oxygen used during your procedure.  There is no need for concern and it should clear up in a day or so.  SYMPTOMS TO REPORT IMMEDIATELY:   Following lower endoscopy (colonoscopy or flexible sigmoidoscopy):  Excessive amounts of blood in the stool  Significant tenderness or worsening of abdominal pains  Swelling of the abdomen that is new, acute  Fever of 100F or higher  For urgent or emergent issues, a gastroenterologist can be reached at any hour by calling [phone].   DIET:  We do  recommend a small meal at first, but then you may proceed to your regular diet.  Drink plenty of fluids but you should avoid alcoholic beverages for 24 hours.  ACTIVITY:  You should plan to take it easy for the rest of today and you should NOT DRIVE or use heavy machinery until tomorrow (because of the sedation medicines used during the test).    FOLLOW UP: Our staff will call the number listed on your records 48-72 hours following your procedure to check on you and address any questions or concerns that you may have regarding the information given to you following your procedure. If we do not reach you, we will leave a message.  We will attempt to reach you two times.  During this call, we will ask if you have developed any symptoms of COVID 19. If you develop any symptoms (ie: fever, flu-like symptoms, shortness of breath, cough etc.) before then, please call [phone].  If you test positive for Covid 19 in the 2 weeks post procedure, please call and report this information to [place].    If any biopsies were taken you will be contacted by phone or by letter within the next 1-3 weeks.  Please call us at [phone] if you have not heard about the biopsies in 3 weeks.    SIGNATURES/CONFIDENTIALITY: You and/or your care partner have signed paperwork which will be entered into your electronic medical record.  These signatures attest to  the fact that that the information above on your After Visit Summary has been reviewed and is understood.  Full responsibility of the confidentiality of this discharge information lies with you and/or your care-partner.

## 2019-09-22 NOTE — Op Note (Signed)
[place] Patient Name: Benjamin Mckay Procedure Date: 09/22/2019 2:00 PM MRN: [id number] Endoscopist: [place]. Loletha Carrow , MD Age: 73 Referring MD:  Date of Birth: 1946/09/13 Gender: Male Account #: [id number] Procedure:                Colonoscopy Indications:              High risk colon cancer surveillance: Personal                            history of colon cancer (colon lymphoma, Dx 1998,                            CTX and resection, no recurrence multiple exams                            since - most recently 2012) Medicines:                Monitored Anesthesia Care Procedure:                Pre-Anesthesia Assessment:                           - Prior to the procedure, a History and Physical                            was performed, and patient medications and                            allergies were reviewed. The patient's tolerance of                            previous anesthesia was also reviewed. The risks                            and benefits of the procedure and the sedation                            options and risks were discussed with the patient.                            All questions were answered, and informed consent                            was obtained. Prior Anticoagulants: The patient                            last took Coumadin (warfarin) 4 days and Lovenox                            (enoxaparin) 1 day prior to the procedure. ASA                            Grade Assessment: III - A patient with severe  systemic disease. After reviewing the risks and                            benefits, the patient was deemed in satisfactory                            condition to undergo the procedure.                           After obtaining informed consent, the colonoscope                            was passed under direct vision. Throughout the                            procedure, the patient's blood pressure, pulse, and                       oxygen saturations were monitored continuously. The                            Colonoscope was introduced through the anus and                            advanced to the the cecum, identified by                            appendiceal orifice and ileocecal valve. The                            colonoscopy was performed without difficulty. The                            patient tolerated the procedure well. The quality                            of the bowel preparation was good. The ileocecal                            valve, appendiceal orifice, and rectum were                            photographed. The bowel preparation used was SUPREP. Scope In: 2:05:28 PM Scope Out: 2:20:13 PM Scope Withdrawal Time: 0 hours 12 minutes 14 seconds  Total Procedure Duration: 0 hours 14 minutes 45 seconds  Findings:                 The perianal and digital rectal examinations were                            normal.                           Two sessile polyps were found in the ascending  colon. The polyps were diminutive in size. These                            polyps were removed with a cold snare. Resection                            and retrieval were complete.                           There was evidence of a prior end-to-end                            colo-rectal anastomosis in the recto-sigmoid colon.                            This was patent and was characterized by healthy                            appearing mucosa.                           Multiple diverticula were found in the left colon.                           The exam was otherwise without abnormality on                            direct and retroflexion views. Complications:            No immediate complications. Estimated Blood Loss:     Estimated blood loss was minimal. Impression:               - Two diminutive polyps in the ascending colon,                            removed with a  cold snare. Resected and retrieved.                           - Patent end-to-end colo-rectal anastomosis,                            characterized by healthy appearing mucosa.                           - Diverticulosis in the left colon.                           - The examination was otherwise normal on direct                            and retroflexion views. Recommendation:           - Patient has a contact number available for                            emergencies. The signs and symptoms of potential  delayed complications were discussed with the                            patient. Return to normal activities tomorrow.                            Written discharge instructions were provided to the                            patient.                           - Resume previous diet.                           - Resume Coumadin (warfarin) today and Lovenox                            (enoxaparin) today at prior doses.                           - Await pathology results.                           - Based on current guidelines, no repeat                            surveillance colonoscopy necessary. Shaylen Nephew L. Loletha Carrow, MD 09/22/2019 2:27:44 PM This report has been signed electronically.

## 2019-09-22 NOTE — Progress Notes (Signed)
Called to room to assist during endoscopic procedure.  Patient ID and intended procedure confirmed with present staff. Received instructions for my participation in the procedure from the performing physician.  

## 2019-09-22 NOTE — Progress Notes (Addendum)
[place]  Pt last dose of coumadin was 09-17-19.  Pt was on a Lovenox bridge.  Last dose of Lovenox was 09-21-19. maw

## 2019-09-22 NOTE — Progress Notes (Signed)
Pt tolerated well. VSS. Afib stable. Awake to recovery.

## 2019-09-24 ENCOUNTER — Telehealth: Payer: Self-pay

## 2019-09-24 DIAGNOSIS — R3121 Asymptomatic microscopic hematuria: Secondary | ICD-10-CM | POA: Diagnosis not present

## 2019-09-24 DIAGNOSIS — R350 Frequency of micturition: Secondary | ICD-10-CM | POA: Diagnosis not present

## 2019-09-24 DIAGNOSIS — N401 Enlarged prostate with lower urinary tract symptoms: Secondary | ICD-10-CM | POA: Diagnosis not present

## 2019-09-24 NOTE — Telephone Encounter (Signed)
  Follow up Call-  Call back number 09/22/2019  Post procedure Call Back phone  # [phone] cell  Permission to leave phone message Yes  Some recent data might be hidden     Patient questions:  Do you have a fever, pain , or abdominal swelling? No. Pain Score  0 *  Have you tolerated food without any problems? Yes.    Have you been able to return to your normal activities? Yes   Do you have any questions about your discharge instructions: Diet   No. Medications  No. Follow up visit  No.  Do you have questions or concerns about your Care? No.  Actions: * If pain score is 4 or above: No action needed, pain <4.  1. Have you developed a fever since your procedure? no  2.   Have you had an respiratory symptoms (SOB or cough) since your procedure? no  3.   Have you tested positive for COVID 19 since your procedure no  4.   Have you had any family members/close contacts diagnosed with the COVID 19 since your procedure?  no   If yes to any of these questions please route to Joylene John, RN and Alphonsa Gin, Therapist, sports.

## 2019-09-27 ENCOUNTER — Other Ambulatory Visit: Payer: Self-pay

## 2019-09-27 ENCOUNTER — Ambulatory Visit (INDEPENDENT_AMBULATORY_CARE_PROVIDER_SITE_OTHER): Payer: PPO | Admitting: Pharmacist

## 2019-09-27 DIAGNOSIS — I482 Chronic atrial fibrillation, unspecified: Secondary | ICD-10-CM | POA: Diagnosis not present

## 2019-09-27 DIAGNOSIS — Z7901 Long term (current) use of anticoagulants: Secondary | ICD-10-CM | POA: Diagnosis not present

## 2019-09-27 LAB — POCT INR: INR: 1.6 — AB (ref 2.0–3.0)

## 2019-09-28 ENCOUNTER — Encounter: Payer: Self-pay | Admitting: Gastroenterology

## 2019-09-30 NOTE — Progress Notes (Signed)
Remote pacemaker transmission.   

## 2019-10-06 ENCOUNTER — Ambulatory Visit (INDEPENDENT_AMBULATORY_CARE_PROVIDER_SITE_OTHER): Payer: PPO | Admitting: Family

## 2019-10-06 ENCOUNTER — Encounter: Payer: Self-pay | Admitting: Family

## 2019-10-06 ENCOUNTER — Telehealth: Payer: Self-pay

## 2019-10-06 DIAGNOSIS — J209 Acute bronchitis, unspecified: Secondary | ICD-10-CM | POA: Diagnosis not present

## 2019-10-06 DIAGNOSIS — Z20828 Contact with and (suspected) exposure to other viral communicable diseases: Secondary | ICD-10-CM

## 2019-10-06 DIAGNOSIS — Z20822 Contact with and (suspected) exposure to covid-19: Secondary | ICD-10-CM

## 2019-10-06 MED ORDER — HYDROCODONE-HOMATROPINE 5-1.5 MG/5ML PO SYRP: 5.0000 mL | ORAL_SOLUTION | Freq: Three times a day (TID) | ORAL | 0 refills | Status: DC | PRN

## 2019-10-06 MED ORDER — AZITHROMYCIN 250 MG PO TABS: ORAL_TABLET | ORAL | 0 refills | Status: DC

## 2019-10-06 NOTE — Progress Notes (Signed)
Benjamin Mckay is a 73 y.o. male with the following history as recorded in EpicCare:  Patient Active Problem List   Diagnosis Date Noted  . Medicare annual wellness visit, subsequent 07/21/2017  . Routine adult health maintenance 07/21/2017  . Pleural effusion, malignant 10/02/2016  . Rotator cuff tendinitis 09/10/2016  . Ventricular tachycardia ([place]) 06/12/2016  . 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 Insomnia 03/08/2016  . Solitary pulmonary nodule 01/09/2016  . Hypercholesterolemia 12/12/2015  . History of sustained ventricular tachycardia 01/30/2015  . Chronic combined systolic (congestive) and diastolic (congestive) heart failure ([place]) 12/18/2014  . Warfarin anticoagulation 12/08/2014  . ARF (acute renal failure) ([place]) 12/07/2014  . Cardiomyopathy, ischemic 11/07/2013  . CHB (complete heart block) ([place]) 11/06/2013  . Single chamber St. Jude pacemaker 2013 11/06/2013  . Chronic atrial fibrillation ([place]) 01/29/2013  . Long term (current) use of anticoagulants 01/29/2013  . HTN (hypertension) 02/01/2012  . Carotid stenosis 02/01/2012  . Renal artery stenosis ([place]) 02/01/2012  . Coronary artery disease involving native coronary artery of native heart with angina pectoris ([place]) 02/01/2012  . Esophageal reflux 08/21/2011  . Hemorrhage of rectum and anus 04/26/2011  . HERNIA, UMBILICAL 123456  . UNSPECIFIED CARDIAC DYSRHYTHMIA 11/11/2007  . Transient cerebral ischemia 11/11/2007  . ESOPHAGEAL STRICTURE 11/11/2007  . DIVERTICULOSIS OF COLON 11/11/2007  . Non-Hodgkin's lymphoma of intestine ([place]) 09/03/2007    Current Outpatient Medications  Medication Sig Dispense Refill  . acetaminophen (TYLENOL) 500 MG tablet Take 1,000 mg by mouth every 6 (six) hours as needed for moderate pain.     . cetirizine (ZYRTEC) 10 MG tablet Take 1 tablet (10 mg total) by mouth daily. (Patient taking differently: Take 10 mg by mouth as needed. ) 30 tablet 11  . enoxaparin (LOVENOX) 150  MG/ML injection Inject 150 mg into the skin. lovenox bridge while coumadin    . furosemide (LASIX) 20 MG tablet TAKE 1 TO 2 TABLETS BY MOUTH DAILY AS DIRECTED 180 tablet 1  . metoprolol succinate (TOPROL-XL) 25 MG 24 hr tablet Take 1 tablet (25 mg total) by mouth 2 (two) times daily. 180 tablet 3  . pravastatin (PRAVACHOL) 80 MG tablet TAKE 1 TABLET BY MOUTH EVERY DAY 90 tablet 1  . Tamsulosin HCl (FLOMAX) 0.4 MG CAPS Take 0.4 mg by mouth at bedtime.     . traZODone (DESYREL) 50 MG tablet TAKE 1/2-1 TABLET BY MOUTH AT BEDTIME AS NEEDED FOR SLEEP 90 tablet 3  . triamcinolone cream (KENALOG) 0.1 % APPLY ON THE SKIN TWICE A DAY AS NEEDED    . warfarin (COUMADIN) 2.5 MG tablet TAKE 1 TO 1.5 TABLETS BY MOUTH DAILY AS DIRECTED BY COUMADIN CLINIC 135 tablet 1   No current facility-administered medications for this visit.    Allergies: Statins, Adhesive [tape], Latex, and Penicillins  Past Medical History:  Diagnosis Date  . Atrial fibrillation ([place])   . Cardiomyopathy, ischemic 11/07/2013  . Cataract    bil cataracts removed  . CHB (complete heart block) ([place]) 11/06/2013  . Chronic combined systolic and diastolic CHF (congestive heart failure) ([place])   . Clotting disorder ([facility])    TIA , cva - PT ON COUMADINE  . Colon polyps   . Colon polyps   . Congenital heart block   . Constipation   . CVA (cerebral infarction)   . Diverticulosis   . Esophageal stricture   . Family history of adverse reaction to anesthesia   . Heart murmur   . Hyperlipidemia   . Hypertension   .  Hyperthyroidism 11/16/2015  . Lymphoma ([place]) 1998   of colon   . Presence of permanent cardiac pacemaker    St. Jude  pt.states he is totally dependent on pacemaker  . Shortness of breath dyspnea    Pulmonary Effusion  . Stroke [facility])    TIA, cva  . Umbilical hernia   . Urinary frequency     Past Surgical History:  Procedure Laterality Date  . APPENDECTOMY  1953  . CARDIAC CATHETERIZATION  01/06/2003   Recommend medical  therapy  . CARDIOVASCULAR STRESS TEST  07/16/2012   Mild-moderate perfusion defect seen in Basal inferior, Mid inferior, and Apica lateral consistent with infarct/scar. No scintigraphic evidence for inducible myocardial ischemia. No ECG changes. EKG negative for ischemia.  Marland Kitchen CAROTID DOPPLER  11/04/2012   Proximal Rt ICA 50-99% diameter reduction; Lft Bulb demonstrated mild amount homogeneous plaque-not hemodynamically significant; Lft ICA-normal patency.  . CHEST TUBE INSERTION Right 03/15/2016   Procedure: INSERTION PLEURAL DRAINAGE CATHETER;  Surgeon: Ivin Poot, MD;  Location: [facility];  Service: Thoracic;  Laterality: Right;  . COLECTOMY     for lymphoma  . COLONOSCOPY    . neck fusion    . PACEMAKER GENERATOR CHANGE  01/31/2012   St Jude Med Accent DR RF model [id number] serial [id number]  . PACEMAKER PLACEMENT     replaced 3 x  . PERMANENT PACEMAKER GENERATOR CHANGE N/A 01/31/2012   Procedure: PERMANENT PACEMAKER GENERATOR CHANGE;  Surgeon: Sanda Klein, MD;  Location: [facility];   . PLEURADESIS N/A 04/02/2016   Procedure: PLEURADESIS;  Surgeon: Ivin Poot, MD;  Location: [facility];  Service: Thoracic;  Laterality: N/A;  . REMOVAL OF PLEURAL DRAINAGE CATHETER Right 07/26/2016   Procedure: REMOVAL OF PLEURAL DRAINAGE CATHETER;  Surgeon: Ivin Poot, MD;  Location: [facility];  Service: Thoracic;  Laterality: Right;  . TONSILLECTOMY    . TRANSTHORACIC ECHOCARDIOGRAM  11/04/2012   EF 123456, systolic function moderately reduced, mild regurg of the aortic and mitral valves.  Marland Kitchen VIDEO ASSISTED THORACOSCOPY Right 04/02/2016   Procedure: Right VIDEO ASSISTED THORACOSCOPY with Biopsies and drainage pleural effusion;  Surgeon: Ivin Poot, MD;  Location: [facility];  Service: Thoracic;  Laterality: Right;    Family History  Problem Relation Age of Onset  . Prostate cancer Father   . Heart disease Mother   . Stroke Mother   . Diabetes Maternal Grandmother   . Colon cancer Neg Hx   . Heart attack  Neg Hx   . Esophageal cancer Neg Hx   . Stomach cancer Neg Hx   . Rectal cancer Neg Hx     Social History   Tobacco Use  . Smoking status: Never Smoker  . Smokeless tobacco: Never Used  Substance Use Topics  . Alcohol use: No    Alcohol/week: 0.0 standard drinks    Subjective:    I connected with [name] on 10/06/19 at 12:40 PM EST by telephone call  and verified that I am speaking with the correct person using two identifiers. Provider in office/ patient is at home; provider and patient are only 2 people on telephone call.     I discussed the limitations of evaluation and management by telemedicine and the availability of in person appointments. The patient expressed understanding and agreed to proceed.  Patient is suspicious he has COVID; notes his 70 yo grandson diagnosed last week; patient has been quarantining but is concerned about persisting cough/ fever; symptoms now present for 7  days and "last night was very bad with the fever. Denies any chest pain or shortness of breath or difficulty breathing; does not want COVID test at this time- has just assumed he has it; requesting something stronger to help with cough especially at night.    Objective:  There were no vitals filed for this visit.  General: Well developed, well nourished, in no acute distress  Lungs: Respirations unlabored;  Neurologic: Alert and oriented; speech intact;    1. Suspected COVID-19 virus infection   2. Acute bronchitis, unspecified organism     Plan:  Agree with decision to quarantine; am concerned about length of time symptoms have been present and limited improvement; will go ahead and start Z-pak for possible secondary infection and Hycodan cough syrup to use at night; increase fluids, rest and follow-up worse, no better.  Time spent 15 minutes  No follow-ups on file.  No orders of the defined types were placed in this encounter.   Requested Prescriptions    No prescriptions  requested or ordered in this encounter

## 2019-10-20 NOTE — Telephone Encounter (Signed)
Call completed

## 2019-10-25 ENCOUNTER — Encounter (INDEPENDENT_AMBULATORY_CARE_PROVIDER_SITE_OTHER): Payer: Self-pay

## 2019-10-25 ENCOUNTER — Ambulatory Visit (INDEPENDENT_AMBULATORY_CARE_PROVIDER_SITE_OTHER): Payer: PPO | Admitting: Pharmacist

## 2019-10-25 ENCOUNTER — Encounter: Payer: Self-pay | Admitting: Pharmacist

## 2019-10-25 ENCOUNTER — Other Ambulatory Visit: Payer: Self-pay

## 2019-10-25 DIAGNOSIS — I482 Chronic atrial fibrillation, unspecified: Secondary | ICD-10-CM

## 2019-10-25 DIAGNOSIS — Z7901 Long term (current) use of anticoagulants: Secondary | ICD-10-CM | POA: Diagnosis not present

## 2019-10-25 LAB — POCT INR: INR: 3.2 — AB (ref 2.0–3.0)

## 2019-10-29 DIAGNOSIS — H43812 Vitreous degeneration, left eye: Secondary | ICD-10-CM | POA: Diagnosis not present

## 2019-10-29 DIAGNOSIS — Z961 Presence of intraocular lens: Secondary | ICD-10-CM | POA: Diagnosis not present

## 2019-11-18 ENCOUNTER — Other Ambulatory Visit: Payer: Self-pay | Admitting: Cardiovascular Disease

## 2019-12-01 ENCOUNTER — Ambulatory Visit (INDEPENDENT_AMBULATORY_CARE_PROVIDER_SITE_OTHER): Payer: PPO | Admitting: *Deleted

## 2019-12-01 DIAGNOSIS — I472 Ventricular tachycardia, unspecified: Secondary | ICD-10-CM

## 2019-12-01 LAB — CUP PACEART REMOTE DEVICE CHECK
Battery Remaining Longevity: 77 mo
Battery Remaining Percentage: 65 %
Battery Voltage: 2.9 V
Brady Statistic RV Percent Paced: 59 %
Date Time Interrogation Session: 20210217021338
Implantable Lead Implant Date: 20130419
Implantable Lead Location: 753860
Implantable Pulse Generator Implant Date: 20130419
Lead Channel Impedance Value: 410 Ohm
Lead Channel Pacing Threshold Amplitude: 0.75 V
Lead Channel Pacing Threshold Pulse Width: 0.5 ms
Lead Channel Sensing Intrinsic Amplitude: 12 mV
Lead Channel Setting Pacing Amplitude: 2.5 V
Lead Channel Setting Pacing Pulse Width: 0.5 ms
Lead Channel Setting Sensing Sensitivity: 2.5 mV
Pulse Gen Model: 1210
Pulse Gen Serial Number: 7313697

## 2019-12-01 NOTE — Progress Notes (Signed)
PPM Remote  

## 2019-12-10 ENCOUNTER — Ambulatory Visit (INDEPENDENT_AMBULATORY_CARE_PROVIDER_SITE_OTHER): Payer: PPO | Admitting: Pharmacist

## 2019-12-10 ENCOUNTER — Ambulatory Visit: Payer: PPO | Admitting: Cardiovascular Disease

## 2019-12-10 ENCOUNTER — Other Ambulatory Visit: Payer: Self-pay

## 2019-12-10 ENCOUNTER — Encounter: Payer: Self-pay | Admitting: Cardiovascular Disease

## 2019-12-10 VITALS — BP 120/80 | HR 87 | Ht 69.0 in | Wt 208.0 lb

## 2019-12-10 DIAGNOSIS — I472 Ventricular tachycardia, unspecified: Secondary | ICD-10-CM

## 2019-12-10 DIAGNOSIS — E7800 Pure hypercholesterolemia, unspecified: Secondary | ICD-10-CM

## 2019-12-10 DIAGNOSIS — I482 Chronic atrial fibrillation, unspecified: Secondary | ICD-10-CM

## 2019-12-10 DIAGNOSIS — I442 Atrioventricular block, complete: Secondary | ICD-10-CM

## 2019-12-10 DIAGNOSIS — Z7901 Long term (current) use of anticoagulants: Secondary | ICD-10-CM | POA: Diagnosis not present

## 2019-12-10 DIAGNOSIS — J90 Pleural effusion, not elsewhere classified: Secondary | ICD-10-CM

## 2019-12-10 DIAGNOSIS — I5042 Chronic combined systolic (congestive) and diastolic (congestive) heart failure: Secondary | ICD-10-CM

## 2019-12-10 DIAGNOSIS — I701 Atherosclerosis of renal artery: Secondary | ICD-10-CM | POA: Diagnosis not present

## 2019-12-10 DIAGNOSIS — I10 Essential (primary) hypertension: Secondary | ICD-10-CM | POA: Diagnosis not present

## 2019-12-10 DIAGNOSIS — I6523 Occlusion and stenosis of bilateral carotid arteries: Secondary | ICD-10-CM

## 2019-12-10 DIAGNOSIS — Z950 Presence of cardiac pacemaker: Secondary | ICD-10-CM | POA: Diagnosis not present

## 2019-12-10 DIAGNOSIS — I2510 Atherosclerotic heart disease of native coronary artery without angina pectoris: Secondary | ICD-10-CM

## 2019-12-10 LAB — LIPID PANEL
Chol/HDL Ratio: 5 ratio (ref 0.0–5.0)
Cholesterol, Total: 211 mg/dL — ABNORMAL HIGH (ref 100–199)
HDL: 42 mg/dL (ref >39)
LDL Chol Calc (NIH): 153 mg/dL — ABNORMAL HIGH (ref 0–99)
Triglycerides: 87 mg/dL (ref 0–149)
VLDL Cholesterol Cal: 16 mg/dL (ref 5–40)

## 2019-12-10 LAB — POCT INR: INR: 2.8 (ref 2.0–3.0)

## 2019-12-10 MED ORDER — WARFARIN SODIUM 2.5 MG PO TABS: ORAL_TABLET | ORAL | 1 refills | Status: DC

## 2019-12-10 NOTE — Patient Instructions (Signed)
Medication Instructions:  No changes *If you need a refill on your cardiac medications before your next appointment, please call your pharmacy*   Lab Work: Your provider would like for you to return in few weeks to have the following labs drawn: fasting Lipid. You do not need an appointment for the lab. Once in our office lobby there is a podium where you can sign in and ring the doorbell to alert [place] that you are here. The lab is open from 8:00 am to 4:30 pm; closed for lunch from 12:45pm-1:45pm.  If you have labs (blood work) drawn today and your tests are completely normal, you will receive your results only by: Marland Kitchen MyChart Message (if you have MyChart) OR . A paper copy in the mail If you have any lab test that is abnormal or we need to change your treatment, we will call you to review the results.   Testing/Procedures: None ordered   Follow-Up: At [facility], you and your health needs are our priority.  As part of our continuing mission to provide you with exceptional heart care, we have created designated Provider Care Teams.  These Care Teams include your primary Cardiologist (physician) and Advanced Practice Providers (APPs -  Physician Assistants and Nurse Practitioners) who all work together to provide you with the care you need, when you need it.  We recommend signing up for the patient portal called "MyChart".  Sign up information is provided on this After Visit Summary.  MyChart is used to connect with patients for Virtual Visits (Telemedicine).  Patients are able to view lab/test results, encounter notes, upcoming appointments, etc.  Non-urgent messages can be sent to your provider as well.   To learn more about what you can do with MyChart, go to [email or web address].    Your next appointment:   12 month(s)  The format for your next appointment:   In Person  Provider:   You may see Sanda Klein, MD or one of the following Advanced Practice Providers on your  designated Care Team:    Almyra Deforest, PA-C  Fabian Sharp, PA-C or   Roby Lofts, [place]

## 2019-12-10 NOTE — Progress Notes (Signed)
. Patient ID: Benjamin Mckay, male   DOB: 05/28/1946, 74 y.o.   MRN: [id number] Patient ID: Benjamin Mckay [facility], male   DOB: 10-12-1946, 74 y.o.   MRN: [id number]    Cardiology Office Note    Date:  12/11/2019   ID:  Benjamin Mckay, DOB [birth year], MRN [id number]  PCP:  [name], FNP  Cardiologist:   Sanda Klein, MD   Chief complaint: AFib, Pacemaker check   History of Present Illness:  Benjamin Mckay is a 74 y.o. male with complex cardiac history (complete heart block-pacemaker dependent, chronic atrial fibrillation versus atrial standstill, history of sustained ventricular tachycardia and near syncope, coronary artery disease with history of inferior wall scar due to myocardial infarction, renal and carotid artery stenosis), amiodarone induced hyperthyroidism (now quiescent) and a recurrent right pleural effusion (s/p pleurodesis June 2017).  Feeling quite well.  He is returned pretty much to his previous baseline after we stopped his amiodarone which caused a lot of complications.  He did mobilize the fact that he is not as strong as he was in the past, but acknowledges that he is now 74 years old.  He remains quite active.  He has not had chest pain, exertional dyspnea, orthopnea, PND, edema, palpitations or syncope.  He denies leg edema, but he takes an additional dose of furosemide occasionally, when he sees that his weight is complex.  He has not had orthostatic hypotension symptoms.  Any falls or bleeding problems.  He is on warfarin anticoagulation and his INR is almost always in therapeutic range.  He has not had any symptoms of stroke or TIA or other embolic events.  Device interrogation shows that his [facility] Jude Accent single-chamber device implanted in 2013 still has about 7 years of estimated longevity.  He has 15% ventricular pacing.  The remainder of the time he appears to have a junctional rhythm.  As before he has occasional episodes of high ventricular rate, 10  times in the last month.  Most of these are regular with a cycle length of 320-340 ms and have electrograms are very similar to his baseline, slower rhythm.  As before, these events appear to be clustered on certain days, for example yesterday morning.  Consistently these are asymptomatic.   His most recent LDL cholesterol is very high at 170 on pravastatin at maximum dose.Marland Kitchen  He has not tolerated more potent statins despite multiple attempts with multiple agents.  His HDL is also low at 37, roughly worsening over the last multiple years.   Past Medical History:  Diagnosis Date  . Atrial fibrillation ([place])   . Cardiomyopathy, ischemic 11/07/2013  . Cataract    bil cataracts removed  . CHB (complete heart block) ([place]) 11/06/2013  . Chronic combined systolic and diastolic CHF (congestive heart failure) ([place])   . Clotting disorder (HCC)    TIA , cva - PT ON COUMADINE  . Colon polyps   . Colon polyps   . Congenital heart block   . Constipation   . CVA (cerebral infarction)   . Diverticulosis   . Esophageal stricture   . Family history of adverse reaction to anesthesia   . Heart murmur   . Hyperlipidemia   . Hypertension   . Hyperthyroidism 11/16/2015  . Lymphoma ([place]) 1998   of colon   . Presence of permanent cardiac pacemaker    St. Jude  pt.states he is totally dependent on pacemaker  . Shortness of breath dyspnea  Pulmonary Effusion  . Stroke [facility])    TIA, cva  . Umbilical hernia   . Urinary frequency     Past Surgical History:  Procedure Laterality Date  . APPENDECTOMY  1953  . CARDIAC CATHETERIZATION  01/06/2003   Recommend medical therapy  . CARDIOVASCULAR STRESS TEST  07/16/2012   Mild-moderate perfusion defect seen in Basal inferior, Mid inferior, and Apica lateral consistent with infarct/scar. No scintigraphic evidence for inducible myocardial ischemia. No ECG changes. EKG negative for ischemia.  Marland Kitchen CAROTID DOPPLER  11/04/2012   Proximal Rt ICA 50-99% diameter reduction;  Lft Bulb demonstrated mild amount homogeneous plaque-not hemodynamically significant; Lft ICA-normal patency.  . CHEST TUBE INSERTION Right 03/15/2016   Procedure: INSERTION PLEURAL DRAINAGE CATHETER;  Surgeon: Ivin Poot, MD;  Location: [place];  Service: Thoracic;  Laterality: Right;  . COLECTOMY     for lymphoma  . COLONOSCOPY    . neck fusion    . PACEMAKER GENERATOR CHANGE  01/31/2012   St Jude Med Accent DR RF model [id number] serial [id number]  . PACEMAKER PLACEMENT     replaced 3 x  . PERMANENT PACEMAKER GENERATOR CHANGE N/A 01/31/2012   Procedure: PERMANENT PACEMAKER GENERATOR CHANGE;  Surgeon: Sanda Klein, MD;  Location: [place] CATH LAB;   . PLEURADESIS N/A 04/02/2016   Procedure: PLEURADESIS;  Surgeon: Ivin Poot, MD;  Location: [place];  Service: Thoracic;  Laterality: N/A;  . REMOVAL OF PLEURAL DRAINAGE CATHETER Right 07/26/2016   Procedure: REMOVAL OF PLEURAL DRAINAGE CATHETER;  Surgeon: Ivin Poot, MD;  Location: [place];  Service: Thoracic;  Laterality: Right;  . TONSILLECTOMY    . TRANSTHORACIC ECHOCARDIOGRAM  11/04/2012   EF 123456, systolic function moderately reduced, mild regurg of the aortic and mitral valves.  Marland Kitchen VIDEO ASSISTED THORACOSCOPY Right 04/02/2016   Procedure: Right VIDEO ASSISTED THORACOSCOPY with Biopsies and drainage pleural effusion;  Surgeon: Ivin Poot, MD;  [facility];  Service: Thoracic;  Laterality: Right;    Current Medications: Outpatient Medications Prior to Visit  Medication Sig Dispense Refill  . acetaminophen (TYLENOL) 500 MG tablet Take 1,000 mg by mouth every 6 (six) hours as needed for moderate pain.     . furosemide (LASIX) 20 MG tablet TAKE 1 TO 2 TABLETS BY MOUTH DAILY AS DIRECTED 180 tablet 1  . HYDROcodone-homatropine (HYCODAN) 5-1.5 MG/5ML syrup Take 5 mLs by mouth every 8 (eight) hours as needed for cough. 120 mL 0  . metoprolol succinate (TOPROL-XL) 25 MG 24 hr tablet Take 1 tablet (25 mg total) by mouth 2 (two) times daily.  180 tablet 3  . pravastatin (PRAVACHOL) 80 MG tablet TAKE 1 TABLET BY MOUTH EVERY DAY 90 tablet 1  . Tamsulosin HCl (FLOMAX) 0.4 MG CAPS Take 0.4 mg by mouth at bedtime.     . traZODone (DESYREL) 50 MG tablet TAKE 1/2-1 TABLET BY MOUTH AT BEDTIME AS NEEDED FOR SLEEP 90 tablet 3  . triamcinolone cream (KENALOG) 0.1 % APPLY ON THE SKIN TWICE A DAY AS NEEDED    . warfarin (COUMADIN) 2.5 MG tablet TAKE 1 TO 1.5 TABLETS BY MOUTH DAILY AS DIRECTED BY COUMADIN CLINIC 135 tablet 1  . azithromycin (ZITHROMAX) 250 MG tablet 2 tabs po qd x 1 day; 1 tablet per day x 4 days; (Patient not taking: Reported on 12/10/2019) 6 tablet 0  . cetirizine (ZYRTEC) 10 MG tablet Take 1 tablet (10 mg total) by mouth daily. (Patient not taking: Reported on 12/10/2019) 30 tablet 11  .  enoxaparin (LOVENOX) 150 MG/ML injection Inject 150 mg into the skin. lovenox bridge while coumadin     No facility-administered medications prior to visit.     Allergies:   Statins, Adhesive [tape], Latex, and Penicillins   Social History   Socioeconomic History  . Marital status: Married    Spouse name: Chartered certified accountant  . Number of children: 3  . Years of education: 90  . Highest education level: Not on file  Occupational History  . Occupation: Retired  Tobacco Use  . Smoking status: Never Smoker  . Smokeless tobacco: Never Used  Substance and Sexual Activity  . Alcohol use: No    Alcohol/week: 0.0 standard drinks  . Drug use: No  . Sexual activity: Yes  Other Topics Concern  . Not on file  Social History Narrative   Fun: Golf when he can   Consumes caffeine 2 cups per day     Social Determinants of Health   Financial Resource Strain:   . Difficulty of Paying Living Expenses: Not on file  Food Insecurity:   . Worried About Charity fundraiser in the Last Year: Not on file  . Ran Out of Food in the Last Year: Not on file  Transportation Needs:   . Lack of Transportation (Medical): Not on file  . Lack of Transportation  (Non-Medical): Not on file  Physical Activity:   . Days of Exercise per Week: Not on file  . Minutes of Exercise per Session: Not on file  Stress:   . Feeling of Stress : Not on file  Social Connections:   . Frequency of Communication with Friends and Family: Not on file  . Frequency of Social Gatherings with Friends and Family: Not on file  . Attends Religious Services: Not on file  . Active Member of Clubs or Organizations: Not on file  . Attends Archivist Meetings: Not on file  . Marital Status: Not on file     Family History:  The patient's family history includes Diabetes in his maternal grandmother; Heart disease in his mother; Prostate cancer in his father; Stroke in his mother.   ROS:   Please see the history of present illness.    ROS all other systems are reviewed and are negative  PHYSICAL EXAM:   VS:  BP 120/80   Pulse 87   Ht 5\' 9"  (1.753 m)   Wt 208 lb (94.3 kg)   SpO2 98%   BMI 30.72 kg/m     General: Alert, oriented x3, no distress, appears well, borderline obese.  Healthy pacemaker site Head: no evidence of trauma, PERRL, EOMI, no exophtalmos or lid lag, no myxedema, no xanthelasma; normal ears, nose and oropharynx Neck: normal jugular venous pulsations and no hepatojugular reflux; brisk carotid pulses without delay and no carotid bruits Chest: clear to auscultation, but with mildly reduced breath sounds in the right base, no other signs of consolidation by percussion or palpation, normal fremitus, symmetrical and full respiratory excursions Cardiovascular: normal position and quality of the apical impulse, regular rhythm, normal first and paradoxically split second heart sounds, no murmurs, rubs or gallops Abdomen: no tenderness or distention, no masses by palpation, no abnormal pulsatility or arterial bruits, normal bowel sounds, no hepatosplenomegaly Extremities: no clubbing, cyanosis or edema; 2+ radial, ulnar and brachial pulses bilaterally; 2+  right femoral, posterior tibial and dorsalis pedis pulses; 2+ left femoral, posterior tibial and dorsalis pedis pulses; no subclavian or femoral bruits Neurological: grossly nonfocal Psych: Normal mood  and affect   Wt Readings from Last 3 Encounters:  12/10/19 208 lb (94.3 kg)  09/22/19 212 lb (96.2 kg)  08/17/19 212 lb 9.6 oz (96.4 kg)      Studies/Labs Reviewed:   EKG:  EKG is ordered today.  It shows a slightly irregular wide QRS rhythm with a left bundle branch block, left axis deviation pattern (not paced.  Unclear if the background atrial mechanism is atrial fibrillation or atrial flutter.  The QRS is broad at 142 ms.  QTc 452 ms. Recent Labs: 07/26/2019: ALT 13; BUN 13; Creatinine, Ser 1.16; Potassium 4.4; Sodium 137; TSH 1.39 08/02/2019: Hemoglobin 16.0; Platelets 167.0   Lipid Panel    Component Value Date/Time   CHOL 211 (H) 12/10/2019 1032   TRIG 87 12/10/2019 1032   HDL 42 12/10/2019 1032   CHOLHDL 5.0 12/10/2019 1032   CHOLHDL 6 07/26/2019 1001   VLDL 20.4 07/26/2019 1001   LDLCALC 153 (H) 12/10/2019 1032     ASSESSMENT:    1. Chronic combined systolic (congestive) and diastolic (congestive) heart failure ([place])   2. CHB (complete heart block) (H[facility])   3. Chronic atrial fibrillation ([place])   4. Coronary artery disease involving native coronary artery of native heart without angina pectoris   5. Essential hypertension   6. Single chamber St. Jude pacemaker 2013   7. Ventricular tachycardia ([place])   8. Long term (current) use of anticoagulants   9. Renal artery stenosis ([place])   10. Bilateral carotid artery stenosis   11. Pleural effusion, right   12. Pure hypercholesterolemia      PLAN:  In order of problems listed above:  1. CHF: Clinically euvolemic, NYHA functional class I on a very low-dose of loop diuretic, occasional dose adjustments performed by the patient.  "Dry weight" appears to be 2102 pounds. 2. CHB: As before, he is clearly not pacemaker  dependent and only requires around 50% ventricular pacing.  It seems that his underlying rhythm is junctional bradycardia versus a remarkably dependable idioventricular rhythm, but he has occasional episodes of high ventricular rate that generally seem to suggest atrial flutter with 2: 1 AV block or accelerated junctional rhythm.  In the past there has been concern for ventricular tachycardia, but these rhythm abnormalities have been present for decades and with a single exception a few years ago but have never been associated with symptoms.  Upgrade to CRT device has been discussed but felt to be high-risk due to his multiple previous surgical procedures and increased risk of infection 3. AFib: CHADSVasc 4 (age, CHF, CAD, HTN).  Appropriately anticoagulated.  Trying to maintain balance between excessive ventricular pacing and control of his episodes of high ventricular rate.  We will continue the same medications. 4. CAD: He has evidence of extensive calcification in all 3 coronary arteries, but has never undergone coronary angiography.  Nuclear stress testing shows evidence of an old inferior scar but no reversible ischemia.  He has never complained of angina pectoris.  He prefers to avoid invasive evaluation. 5. HTN: Reasonable control. 6. PPM: Normal device function.  Continue remote downloads every 3 months.  We will see him back in the office in 1 year if there are no interim events. 7. VT: No convincing evidence of ventricular tachycardia on current device check.  His episodes of high ventricular rate seems to be supraventricular in origin, probably atrial flutter with 2: 1 AV block.  He had serious complications from amiodarone in the past including thyrotoxicosis and heart  failure decompensation, with recurrent pleural effusion requiring pleurodesis.  No convincing evidence of ventricular tachycardia has been recorded since he was started on treatment with amiodarone in 2014 for an episode of near  syncope and sustained monomorphic VT with a fairly slow cycle length of 370 ms (inferior scar VT?).  8. Anticoagulation: Compliant with INR monitoring, almost always in therapeutic range, no bleeding complications.  Today INR was 2.8. 9. RAS: Stable renal function, controlled blood pressure. 10. Carotid stenosis: Rechecked 12/2015, mild progression. Recommend we reevaluate by [place], but patient declined. 11. R pleural effusion: no recurrence, s/p pleurodesis, 2017. 12. HLP: Does not tolerate more potent statins.  Lipid profile updated today and LDL is quite high at 150.  Our clinical pharmacist will help [place] apply for [place] or [place].    Medication Adjustments/Labs and Tests Ordered: Current medicines are reviewed at length with the patient today.  Concerns regarding medicines are outlined above.  Medication changes, Labs and Tests ordered today are listed in the Patient Instructions below. Patient Instructions  Medication Instructions:  No changes *If you need a refill on your cardiac medications before your next appointment, please call your pharmacy*   Lab Work: Your provider would like for you to return in few weeks to have the following labs drawn: fasting Lipid. You do not need an appointment for the lab. Once in our office lobby there is a podium where you can sign in and ring the doorbell to alert [place] that you are here. The lab is open from 8:00 am to 4:30 pm; closed for lunch from 12:45pm-1:45pm.  If you have labs (blood work) drawn today and your tests are completely normal, you will receive your results only by: Marland Kitchen MyChart Message (if you have MyChart) OR . A paper copy in the mail If you have any lab test that is abnormal or we need to change your treatment, we will call you to review the results.   Testing/Procedures: None ordered   Follow-Up: At [facility], you and your health needs are our priority.  As part of our continuing mission to provide you with exceptional  heart care, we have created designated Provider Care Teams.  These Care Teams include your primary Cardiologist (physician) and Advanced Practice Providers (APPs -  Physician Assistants and Nurse Practitioners) who all work together to provide you with the care you need, when you need it.  We recommend signing up for the patient portal called "MyChart".  Sign up information is provided on this After Visit Summary.  MyChart is used to connect with patients for Virtual Visits (Telemedicine).  Patients are able to view lab/test results, encounter notes, upcoming appointments, etc.  Non-urgent messages can be sent to your provider as well.   To learn more about what you can do with MyChart, go to [email or web address].    Your next appointment:   12 month(s)  The format for your next appointment:   In Person  Provider:   You may see Sanda Klein, MD or one of the following Advanced Practice Providers on your designated Care Team:    Almyra Deforest, PA-C  Fabian Sharp, [place] or   Roby Lofts, PA-C      Signed, Sanda Klein, MD  12/11/2019 7:33 PM    [address] [place], [place], [place]  [address] Phone: [phone]; Fax: [phone]

## 2019-12-11 ENCOUNTER — Encounter: Payer: Self-pay | Admitting: Cardiovascular Disease

## 2019-12-14 ENCOUNTER — Telehealth: Payer: Self-pay

## 2019-12-14 MED ORDER — REPATHA SURECLICK 140 MG/ML ~~LOC~~ SOAJ: 140.0000 mg | SUBCUTANEOUS | 11 refills | Status: DC

## 2019-12-14 MED ORDER — WARFARIN SODIUM 2.5 MG PO TABS: ORAL_TABLET | ORAL | 1 refills | Status: DC

## 2019-12-14 NOTE — Telephone Encounter (Signed)
Called and spoke w/pt regarding the approval of the healthwel [place], email sent to pt, pt voiced understanding.

## 2019-12-14 NOTE — Telephone Encounter (Signed)
lmomed to start repatha, rx sent, pt instructed to call the office if unaffordable

## 2019-12-20 ENCOUNTER — Other Ambulatory Visit: Payer: Self-pay | Admitting: Cardiovascular Disease

## 2020-01-06 ENCOUNTER — Ambulatory Visit: Payer: PPO | Admitting: Cardiovascular Disease

## 2020-01-12 ENCOUNTER — Ambulatory Visit (INDEPENDENT_AMBULATORY_CARE_PROVIDER_SITE_OTHER): Payer: PPO | Admitting: Pharmacist

## 2020-01-12 ENCOUNTER — Other Ambulatory Visit: Payer: Self-pay

## 2020-01-12 DIAGNOSIS — Z7901 Long term (current) use of anticoagulants: Secondary | ICD-10-CM | POA: Diagnosis not present

## 2020-01-12 DIAGNOSIS — I482 Chronic atrial fibrillation, unspecified: Secondary | ICD-10-CM

## 2020-01-12 LAB — POCT INR: INR: 2.7 (ref 2.0–3.0)

## 2020-02-23 ENCOUNTER — Other Ambulatory Visit: Payer: Self-pay

## 2020-02-23 ENCOUNTER — Ambulatory Visit (INDEPENDENT_AMBULATORY_CARE_PROVIDER_SITE_OTHER): Payer: PPO | Admitting: Pharmacist

## 2020-02-23 DIAGNOSIS — Z7901 Long term (current) use of anticoagulants: Secondary | ICD-10-CM

## 2020-02-23 DIAGNOSIS — I482 Chronic atrial fibrillation, unspecified: Secondary | ICD-10-CM | POA: Diagnosis not present

## 2020-02-23 DIAGNOSIS — E7800 Pure hypercholesterolemia, unspecified: Secondary | ICD-10-CM

## 2020-02-23 LAB — HEPATIC FUNCTION PANEL
ALT: 12 IU/L (ref 0–44)
AST: 20 IU/L (ref 0–40)
Albumin: 4.1 g/dL (ref 3.7–4.7)
Alkaline Phosphatase: 109 IU/L (ref 39–117)
Bilirubin Total: 1.1 mg/dL (ref 0.0–1.2)
Bilirubin, Direct: 0.34 mg/dL (ref 0.00–0.40)
Total Protein: 6.9 g/dL (ref 6.0–8.5)

## 2020-02-23 LAB — LIPID PANEL WITH LDL/HDL RATIO
Cholesterol, Total: 102 mg/dL (ref 100–199)
HDL: 43 mg/dL (ref >39)
LDL Chol Calc (NIH): 46 mg/dL (ref 0–99)
LDL/HDL Ratio: 1.1 ratio (ref 0.0–3.6)
Triglycerides: 59 mg/dL (ref 0–149)
VLDL Cholesterol Cal: 13 mg/dL (ref 5–40)

## 2020-02-23 LAB — POCT INR: INR: 3.6 — AB (ref 2.0–3.0)

## 2020-02-23 MED ORDER — FUROSEMIDE 20 MG PO TABS: ORAL_TABLET | ORAL | 1 refills | Status: DC

## 2020-03-01 ENCOUNTER — Ambulatory Visit (INDEPENDENT_AMBULATORY_CARE_PROVIDER_SITE_OTHER): Payer: PPO | Admitting: *Deleted

## 2020-03-01 DIAGNOSIS — I482 Chronic atrial fibrillation, unspecified: Secondary | ICD-10-CM

## 2020-03-01 DIAGNOSIS — I442 Atrioventricular block, complete: Secondary | ICD-10-CM

## 2020-03-01 LAB — CUP PACEART REMOTE DEVICE CHECK
Battery Remaining Longevity: 86 mo
Battery Remaining Percentage: 73 %
Battery Voltage: 2.92 V
Brady Statistic RV Percent Paced: 56 %
Date Time Interrogation Session: 20210519034301
Implantable Lead Implant Date: 20130419
Implantable Lead Location: 753860
Implantable Pulse Generator Implant Date: 20130419
Lead Channel Impedance Value: 430 Ohm
Lead Channel Pacing Threshold Amplitude: 0.75 V
Lead Channel Pacing Threshold Pulse Width: 0.5 ms
Lead Channel Sensing Intrinsic Amplitude: 12 mV
Lead Channel Setting Pacing Amplitude: 2.5 V
Lead Channel Setting Pacing Pulse Width: 0.5 ms
Lead Channel Setting Sensing Sensitivity: 2.5 mV
Pulse Gen Model: 1210
Pulse Gen Serial Number: 7313697

## 2020-03-03 NOTE — Progress Notes (Signed)
Remote pacemaker transmission.   

## 2020-03-28 ENCOUNTER — Telehealth: Payer: Self-pay | Admitting: Cardiovascular Disease

## 2020-03-28 NOTE — Telephone Encounter (Signed)
Patient needs Dr. Sallyanne Kuster to sign some paperwork regarding his handicap placard.

## 2020-03-28 NOTE — Telephone Encounter (Signed)
Called and spoke with pt, notified that Dr.C is not in the office today but that he could drop the paperwork off to [place] and Dr.C can sign it tomorrow. Pt states this would be fine and he would drop it off later today. States he has an appt with [facility] on 04/05/20 and he will pick the paperwork up then. No other questions from pt at this time.

## 2020-04-05 ENCOUNTER — Other Ambulatory Visit: Payer: Self-pay

## 2020-04-05 ENCOUNTER — Ambulatory Visit (INDEPENDENT_AMBULATORY_CARE_PROVIDER_SITE_OTHER): Payer: PPO | Admitting: Pharmacist

## 2020-04-05 DIAGNOSIS — Z7901 Long term (current) use of anticoagulants: Secondary | ICD-10-CM | POA: Diagnosis not present

## 2020-04-05 DIAGNOSIS — I482 Chronic atrial fibrillation, unspecified: Secondary | ICD-10-CM | POA: Diagnosis not present

## 2020-04-05 LAB — POCT INR: INR: 3.2 — AB (ref 2.0–3.0)

## 2020-04-25 ENCOUNTER — Other Ambulatory Visit: Payer: Self-pay | Admitting: Cardiovascular Disease

## 2020-04-25 DIAGNOSIS — I5023 Acute on chronic systolic (congestive) heart failure: Secondary | ICD-10-CM

## 2020-05-18 ENCOUNTER — Other Ambulatory Visit: Payer: Self-pay | Admitting: Cardiovascular Disease

## 2020-05-19 ENCOUNTER — Ambulatory Visit (INDEPENDENT_AMBULATORY_CARE_PROVIDER_SITE_OTHER): Payer: PPO

## 2020-05-19 ENCOUNTER — Other Ambulatory Visit: Payer: Self-pay

## 2020-05-19 DIAGNOSIS — Z5181 Encounter for therapeutic drug level monitoring: Secondary | ICD-10-CM | POA: Diagnosis not present

## 2020-05-19 DIAGNOSIS — I482 Chronic atrial fibrillation, unspecified: Secondary | ICD-10-CM

## 2020-05-19 DIAGNOSIS — Z7901 Long term (current) use of anticoagulants: Secondary | ICD-10-CM

## 2020-05-19 LAB — POCT INR: INR: 3.9 — AB (ref 2.0–3.0)

## 2020-05-19 MED ORDER — PRAVASTATIN SODIUM 80 MG PO TABS: 80.0000 mg | ORAL_TABLET | Freq: Every day | ORAL | 1 refills | Status: DC

## 2020-05-19 NOTE — Patient Instructions (Signed)
Hold today and then continue taking 1.5 tablets every day. Repeat INR in 4 weeks.

## 2020-05-31 ENCOUNTER — Ambulatory Visit (INDEPENDENT_AMBULATORY_CARE_PROVIDER_SITE_OTHER): Payer: PPO | Admitting: *Deleted

## 2020-05-31 DIAGNOSIS — I4891 Unspecified atrial fibrillation: Secondary | ICD-10-CM

## 2020-05-31 LAB — CUP PACEART REMOTE DEVICE CHECK
Battery Remaining Longevity: 68 mo
Battery Remaining Percentage: 57 %
Battery Voltage: 2.89 V
Brady Statistic RV Percent Paced: 56 %
Date Time Interrogation Session: 20210818021027
Implantable Lead Implant Date: 20130419
Implantable Lead Location: 753860
Implantable Pulse Generator Implant Date: 20130419
Lead Channel Impedance Value: 430 Ohm
Lead Channel Pacing Threshold Amplitude: 0.75 V
Lead Channel Pacing Threshold Pulse Width: 0.5 ms
Lead Channel Sensing Intrinsic Amplitude: 12 mV
Lead Channel Setting Pacing Amplitude: 2.5 V
Lead Channel Setting Pacing Pulse Width: 0.5 ms
Lead Channel Setting Sensing Sensitivity: 2.5 mV
Pulse Gen Model: 1210
Pulse Gen Serial Number: 7313697

## 2020-06-02 NOTE — Progress Notes (Signed)
Remote pacemaker transmission.   

## 2020-06-16 ENCOUNTER — Ambulatory Visit (INDEPENDENT_AMBULATORY_CARE_PROVIDER_SITE_OTHER): Payer: PPO

## 2020-06-16 ENCOUNTER — Other Ambulatory Visit: Payer: Self-pay

## 2020-06-16 DIAGNOSIS — Z7901 Long term (current) use of anticoagulants: Secondary | ICD-10-CM

## 2020-06-16 DIAGNOSIS — I482 Chronic atrial fibrillation, unspecified: Secondary | ICD-10-CM

## 2020-06-16 LAB — POCT INR: INR: 2.4 (ref 2.0–3.0)

## 2020-06-16 NOTE — Patient Instructions (Signed)
continue taking 1.5 tablets every day. Repeat INR in 6 weeks.  

## 2020-07-13 ENCOUNTER — Other Ambulatory Visit: Payer: Self-pay | Admitting: Family

## 2020-07-14 ENCOUNTER — Other Ambulatory Visit: Payer: Self-pay | Admitting: Family

## 2020-07-14 MED ORDER — TRAZODONE HCL 50 MG PO TABS: ORAL_TABLET | ORAL | 3 refills | Status: DC

## 2020-07-28 ENCOUNTER — Other Ambulatory Visit: Payer: Self-pay

## 2020-07-28 ENCOUNTER — Ambulatory Visit (INDEPENDENT_AMBULATORY_CARE_PROVIDER_SITE_OTHER): Payer: PPO

## 2020-07-28 ENCOUNTER — Encounter: Payer: Self-pay | Admitting: Family

## 2020-07-28 ENCOUNTER — Ambulatory Visit (INDEPENDENT_AMBULATORY_CARE_PROVIDER_SITE_OTHER): Payer: PPO | Admitting: Family

## 2020-07-28 VITALS — BP 126/74 | HR 75 | Temp 98.3°F | Ht 69.0 in | Wt 208.0 lb

## 2020-07-28 DIAGNOSIS — Z23 Encounter for immunization: Secondary | ICD-10-CM | POA: Diagnosis not present

## 2020-07-28 DIAGNOSIS — I482 Chronic atrial fibrillation, unspecified: Secondary | ICD-10-CM | POA: Diagnosis not present

## 2020-07-28 DIAGNOSIS — Z125 Encounter for screening for malignant neoplasm of prostate: Secondary | ICD-10-CM

## 2020-07-28 DIAGNOSIS — Z13220 Encounter for screening for lipoid disorders: Secondary | ICD-10-CM | POA: Diagnosis not present

## 2020-07-28 DIAGNOSIS — Z0000 Encounter for general adult medical examination without abnormal findings: Secondary | ICD-10-CM | POA: Diagnosis not present

## 2020-07-28 DIAGNOSIS — Z7901 Long term (current) use of anticoagulants: Secondary | ICD-10-CM

## 2020-07-28 LAB — CBC WITH DIFFERENTIAL/PLATELET
Basophils Absolute: 0.1 10*3/uL (ref 0.0–0.1)
Basophils Relative: 0.8 % (ref 0.0–3.0)
Eosinophils Absolute: 0.2 10*3/uL (ref 0.0–0.7)
Eosinophils Relative: 3.2 % (ref 0.0–5.0)
HCT: 50.9 % (ref 39.0–52.0)
Hemoglobin: 17.4 g/dL — ABNORMAL HIGH (ref 13.0–17.0)
Lymphocytes Relative: 27.8 % (ref 12.0–46.0)
Lymphs Abs: 2.1 10*3/uL (ref 0.7–4.0)
MCHC: 34.2 g/dL (ref 30.0–36.0)
MCV: 97.2 fl (ref 78.0–100.0)
Monocytes Absolute: 0.6 10*3/uL (ref 0.1–1.0)
Monocytes Relative: 8 % (ref 3.0–12.0)
Neutro Abs: 4.5 10*3/uL (ref 1.4–7.7)
Neutrophils Relative %: 60.2 % (ref 43.0–77.0)
Platelets: 214 10*3/uL (ref 150.0–400.0)
RBC: 5.24 Mil/uL (ref 4.22–5.81)
RDW: 13.7 % (ref 11.5–15.5)
WBC: 7.6 10*3/uL (ref 4.0–10.5)

## 2020-07-28 LAB — COMPREHENSIVE METABOLIC PANEL
ALT: 13 U/L (ref 0–53)
AST: 18 U/L (ref 0–37)
Albumin: 4.1 g/dL (ref 3.5–5.2)
Alkaline Phosphatase: 100 U/L (ref 39–117)
BUN: 16 mg/dL (ref 6–23)
CO2: 29 mEq/L (ref 19–32)
Calcium: 9.1 mg/dL (ref 8.4–10.5)
Chloride: 102 mEq/L (ref 96–112)
Creatinine, Ser: 1.21 mg/dL (ref 0.40–1.50)
GFR: 58.37 mL/min — ABNORMAL LOW (ref >60.00)
Glucose, Bld: 100 mg/dL — ABNORMAL HIGH (ref 70–99)
Potassium: 4.8 mEq/L (ref 3.5–5.1)
Sodium: 137 mEq/L (ref 135–145)
Total Bilirubin: 1.4 mg/dL — ABNORMAL HIGH (ref 0.2–1.2)
Total Protein: 7.3 g/dL (ref 6.0–8.3)

## 2020-07-28 LAB — LIPID PANEL
Cholesterol: 152 mg/dL (ref 0–200)
HDL: 42.7 mg/dL (ref >39.00)
LDL Cholesterol: 95 mg/dL (ref 0–99)
NonHDL: 109.52
Total CHOL/HDL Ratio: 4
Triglycerides: 75 mg/dL (ref 0.0–149.0)
VLDL: 15 mg/dL (ref 0.0–40.0)

## 2020-07-28 LAB — POCT INR: INR: 2.8 (ref 2.0–3.0)

## 2020-07-28 LAB — PSA: PSA: 1.2 ng/mL (ref 0.10–4.00)

## 2020-07-28 NOTE — Addendum Note (Signed)
Addended by: Marcina Millard on: 07/28/2020 04:09 PM   Modules accepted: Orders

## 2020-07-28 NOTE — Patient Instructions (Signed)
continue taking 1.5 tablets every day. Repeat INR in 6 weeks.  

## 2020-07-28 NOTE — Progress Notes (Signed)
Benjamin Mckay is a 74 y.o. male with the following history as recorded in EpicCare:  Patient Active Problem List   Diagnosis Date Noted  . Medicare annual wellness visit, subsequent 07/21/2017  . Routine adult health maintenance 07/21/2017  . Pleural effusion, malignant 10/02/2016  . Rotator cuff tendinitis 09/10/2016  . Ventricular tachycardia ([place]) 06/12/2016  . 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 Insomnia 03/08/2016  . Solitary pulmonary nodule 01/09/2016  . Hypercholesterolemia 12/12/2015  . History of sustained ventricular tachycardia 01/30/2015  . Chronic combined systolic (congestive) and diastolic (congestive) heart failure ([place]) 12/18/2014  . Warfarin anticoagulation 12/08/2014  . ARF (acute renal failure) ([place]) 12/07/2014  . Cardiomyopathy, ischemic 11/07/2013  . CHB (complete heart block) ([place]) 11/06/2013  . Single chamber St. Jude pacemaker 2013 11/06/2013  . Chronic atrial fibrillation ([place]) 01/29/2013  . Long term (current) use of anticoagulants 01/29/2013  . HTN (hypertension) 02/01/2012  . Carotid stenosis 02/01/2012  . Renal artery stenosis ([place]) 02/01/2012  . Coronary artery disease involving native coronary artery of native heart with angina pectoris ([place]) 02/01/2012  . Esophageal reflux 08/21/2011  . Hemorrhage of rectum and anus 04/26/2011  . HERNIA, UMBILICAL 52/84/1324  . UNSPECIFIED CARDIAC DYSRHYTHMIA 11/11/2007  . Transient cerebral ischemia 11/11/2007  . ESOPHAGEAL STRICTURE 11/11/2007  . DIVERTICULOSIS OF COLON 11/11/2007  . Non-Hodgkin's lymphoma of intestine ([place]) 09/03/2007    Current Outpatient Medications  Medication Sig Dispense Refill  . acetaminophen (TYLENOL) 500 MG tablet Take 1,000 mg by mouth every 6 (six) hours as needed for moderate pain.     . Evolocumab (REPATHA SURECLICK) 401 MG/ML SOAJ Inject 140 mg into the skin every 14 (fourteen) days. 2 pen 11  . furosemide (LASIX) 20 MG tablet TAKE 1 TO 2 TABLETS BY MOUTH DAILY  AS DIRECTED 180 tablet 1  . metoprolol succinate (TOPROL-XL) 25 MG 24 hr tablet TAKE 1 TABLET BY MOUTH TWICE A DAY 180 tablet 3  . pravastatin (PRAVACHOL) 80 MG tablet Take 1 tablet (80 mg total) by mouth daily. 90 tablet 1  . Tamsulosin HCl (FLOMAX) 0.4 MG CAPS Take 0.4 mg by mouth at bedtime.     . traZODone (DESYREL) 50 MG tablet TAKE 1/2 TO 1 TABLET BY MOUTH AT BEDTIME AS NEEDED FOR SLEEP 90 tablet 3  . triamcinolone cream (KENALOG) 0.1 % APPLY ON THE SKIN TWICE A DAY AS NEEDED    . warfarin (COUMADIN) 2.5 MG tablet Take 1 to 1.5 tablets by mouth daily as directed by the [facility] 135 tablet 1   No current facility-administered medications for this visit.    Allergies: Statins, Adhesive [tape], Latex, and Penicillins  Past Medical History:  Diagnosis Date  . Atrial fibrillation ([place])   . Cardiomyopathy, ischemic 11/07/2013  . Cataract    bil cataracts removed  . CHB (complete heart block) ([place]) 11/06/2013  . Chronic combined systolic and diastolic CHF (congestive heart failure) ([address] [place] [address])   . Clotting disorder ([facility])    TIA , cva - PT ON COUMADINE  . Colon polyps   . Colon polyps   . Congenital heart block   . Constipation   . CVA (cerebral infarction)   . Diverticulosis   . Esophageal stricture   . Family history of adverse reaction to anesthesia   . Heart murmur   . Hyperlipidemia   . Hypertension   . Hyperthyroidism 11/16/2015  . Lymphoma ([place]) 1998   of colon   . Presence of permanent cardiac pacemaker    St. Jude  pt.states he is totally dependent on pacemaker  . Shortness of breath dyspnea    Pulmonary Effusion  . Stroke [facility])    TIA, cva  . Umbilical hernia   . Urinary frequency     Past Surgical History:  Procedure Laterality Date  . APPENDECTOMY  1953  . CARDIAC CATHETERIZATION  01/06/2003   Recommend medical therapy  . CARDIOVASCULAR STRESS TEST  07/16/2012   Mild-moderate perfusion defect seen in Basal inferior, Mid inferior, and Apica lateral consistent  with infarct/scar. No scintigraphic evidence for inducible myocardial ischemia. No ECG changes. EKG negative for ischemia.  Marland Kitchen CAROTID DOPPLER  11/04/2012   Proximal Rt ICA 50-99% diameter reduction; Lft Bulb demonstrated mild amount homogeneous plaque-not hemodynamically significant; Lft ICA-normal patency.  . CHEST TUBE INSERTION Right 03/15/2016   Procedure: INSERTION PLEURAL DRAINAGE CATHETER;  Surgeon: Ivin Poot, MD;  Location: [place];  Service: Thoracic;  Laterality: Right;  . COLECTOMY     for lymphoma  . COLONOSCOPY    . neck fusion    . PACEMAKER GENERATOR CHANGE  01/31/2012   St Jude Med Accent DR RF model [id number] serial [id number]  . PACEMAKER PLACEMENT     replaced 3 x  . PERMANENT PACEMAKER GENERATOR CHANGE N/A 01/31/2012   Procedure: PERMANENT PACEMAKER GENERATOR CHANGE;  Surgeon: Sanda Klein, MD;  Location: [place] [facility] CATH LAB;   . PLEURADESIS N/A 04/02/2016   Procedure: PLEURADESIS;  Surgeon: Ivin Poot, MD;  Location: [place];  Service: Thoracic;  Laterality: N/A;  . REMOVAL OF PLEURAL DRAINAGE CATHETER Right 07/26/2016   Procedure: REMOVAL OF PLEURAL DRAINAGE CATHETER;  Surgeon: Ivin Poot, MD;  Location: [place];  Service: Thoracic;  Laterality: Right;  . TONSILLECTOMY    . TRANSTHORACIC ECHOCARDIOGRAM  11/04/2012   EF 41-66%, systolic function moderately reduced, mild regurg of the aortic and mitral valves.  Marland Kitchen VIDEO ASSISTED THORACOSCOPY Right 04/02/2016   Procedure: Right VIDEO ASSISTED THORACOSCOPY with Biopsies and drainage pleural effusion;  Surgeon: Ivin Poot, MD;  Location: [facility] OR;  Service: Thoracic;  Laterality: Right;    Family History  Problem Relation Age of Onset  . Prostate cancer Father   . Heart disease Mother   . Stroke Mother   . Diabetes Maternal Grandmother   . Colon cancer Neg Hx   . Heart attack Neg Hx   . Esophageal cancer Neg Hx   . Stomach cancer Neg Hx   . Rectal cancer Neg Hx     Social History   Tobacco Use  . Smoking status: Never  Smoker  . Smokeless tobacco: Never Used  Substance Use Topics  . Alcohol use: No    Alcohol/week: 0.0 standard drinks    Subjective:  Presents for yearly CPE; continuing to see cardiology and urology regularly; also scheduled to see dermatology;  No acute concerns today; would like to get flu shot;  Review of Systems  Constitutional: Negative.   HENT: Negative.   Eyes: Negative.   Respiratory: Negative.   Cardiovascular: Negative.   Gastrointestinal: Negative.   Genitourinary: Negative.   Musculoskeletal: Negative.   Skin: Negative.   Neurological: Negative.   Endo/Heme/Allergies: Negative.      Objective:  Vitals:   07/28/20 1131  BP: 126/74  Pulse: 75  Temp: 98.3 F (36.8 C)  TempSrc: Oral  SpO2: 96%  Weight: 208 lb (94.3 kg)  Height: 5' 9"  (1.753 m)    General: Well developed, well nourished, in no acute distress  Skin : Warm and  dry.  Head: Normocephalic and atraumatic  Eyes: Sclera and conjunctiva clear; pupils round and reactive to light; extraocular movements intact  Ears: External normal; canals clear; tympanic membranes normal  Oropharynx: Pink, supple. No suspicious lesions  Neck: Supple without thyromegaly, adenopathy  Lungs: Respirations unlabored; clear to auscultation bilaterally without wheeze, rales, rhonchi  CVS exam: normal rate and regular rhythm.  Abdomen: Soft; nontender; nondistended; normoactive bowel sounds; no masses or hepatosplenomegaly  Musculoskeletal: No deformities; no active joint inflammation  Extremities: No edema, cyanosis, clubbing  Vessels: Symmetric bilaterally  Neurologic: Alert and oriented; speech intact; face symmetrical; moves all extremities well; CNII-XII intact without focal deficit  Assessment:  1. PE (physical exam), annual   2. Lipid screening   3. Prostate cancer screening     Plan:  Age appropriate preventive healthcare needs addressed; encouraged regular eye doctor and dental exams; encouraged regular  exercise; will update labs and refills as needed today; follow-up to be determined;  Flu shot given;  This visit occurred during the SARS-CoV-2 public health emergency.  Safety protocols were in place, including screening questions prior to the visit, additional usage of staff PPE, and extensive cleaning of exam room while observing appropriate contact time as indicated for disinfecting solutions.     No follow-ups on file.  Orders Placed This Encounter  Procedures  . CBC with Differential/Platelet    Standing Status:   Future    Number of Occurrences:   1    Standing Expiration Date:   07/28/2021  . Comp Met (CMET)    Standing Status:   Future    Number of Occurrences:   1    Standing Expiration Date:   07/28/2021  . Lipid panel    Standing Status:   Future    Number of Occurrences:   1    Standing Expiration Date:   07/28/2021  . PSA    Standing Status:   Future    Number of Occurrences:   1    Standing Expiration Date:   07/28/2021    Requested Prescriptions    No prescriptions requested or ordered in this encounter

## 2020-07-31 ENCOUNTER — Other Ambulatory Visit: Payer: Self-pay | Admitting: Family

## 2020-07-31 DIAGNOSIS — R7989 Other specified abnormal findings of blood chemistry: Secondary | ICD-10-CM

## 2020-08-01 DIAGNOSIS — Z85828 Personal history of other malignant neoplasm of skin: Secondary | ICD-10-CM | POA: Diagnosis not present

## 2020-08-01 DIAGNOSIS — I8393 Asymptomatic varicose veins of bilateral lower extremities: Secondary | ICD-10-CM | POA: Diagnosis not present

## 2020-08-01 DIAGNOSIS — L905 Scar conditions and fibrosis of skin: Secondary | ICD-10-CM | POA: Diagnosis not present

## 2020-08-01 DIAGNOSIS — D229 Melanocytic nevi, unspecified: Secondary | ICD-10-CM | POA: Diagnosis not present

## 2020-08-01 DIAGNOSIS — L819 Disorder of pigmentation, unspecified: Secondary | ICD-10-CM | POA: Diagnosis not present

## 2020-08-01 DIAGNOSIS — L814 Other melanin hyperpigmentation: Secondary | ICD-10-CM | POA: Diagnosis not present

## 2020-08-01 DIAGNOSIS — D1801 Hemangioma of skin and subcutaneous tissue: Secondary | ICD-10-CM | POA: Diagnosis not present

## 2020-08-01 DIAGNOSIS — L821 Other seborrheic keratosis: Secondary | ICD-10-CM | POA: Diagnosis not present

## 2020-08-01 DIAGNOSIS — L570 Actinic keratosis: Secondary | ICD-10-CM | POA: Diagnosis not present

## 2020-08-07 ENCOUNTER — Ambulatory Visit (INDEPENDENT_AMBULATORY_CARE_PROVIDER_SITE_OTHER): Payer: PPO

## 2020-08-07 DIAGNOSIS — Z0000 Encounter for general adult medical examination without abnormal findings: Secondary | ICD-10-CM | POA: Diagnosis not present

## 2020-08-07 NOTE — Patient Instructions (Addendum)
Benjamin Mckay , Thank you for taking time to come for your Medicare Wellness Visit. I appreciate your ongoing commitment to your health goals. Please review the following plan we discussed and let me know if I can assist you in the future.   Screening recommendations/referrals: Colonoscopy: 09/22/2019; no repeat due to age Recommended yearly ophthalmology/optometry visit for glaucoma screening and checkup Recommended yearly dental visit for hygiene and checkup  Vaccinations: Influenza vaccine: 07/28/2020 Pneumococcal vaccine: up to date Tdap vaccine: never done Shingles vaccine: never done   Covid-19: up to date  Advanced directives: Please bring a copy of your health care power of attorney and living will to the office at your convenience.  Conditions/risks identified: Yes; Reviewed health maintenance screenings with patient today and relevant education, vaccines, and/or referrals were provided. Please continue to do your personal lifestyle choices by: daily care of teeth and gums, regular physical activity (goal should be 5 days a week for 30 minutes), eat a healthy diet, avoid tobacco and drug use, limiting any alcohol intake, taking a low-dose aspirin (if not allergic or have been advised by your provider otherwise) and taking vitamins and minerals as recommended by your provider. Continue doing brain stimulating activities (puzzles, reading, adult coloring books, staying active) to keep memory sharp. Continue to eat heart healthy diet (full of fruits, vegetables, whole grains, lean protein, water--limit salt, fat, and sugar intake) and increase physical activity as tolerated.  Next appointment: Please schedule your next Medicare Wellness Visit with your Nurse Health Advisor in 1 year by calling [phone].  Preventive Care 58 Years and Older, Male Preventive care refers to lifestyle choices and visits with your health care provider that can promote health and wellness. What does preventive  care include?  A yearly physical exam. This is also called an annual well check.  Dental exams once or twice a year.  Routine eye exams. Ask your health care provider how often you should have your eyes checked.  Personal lifestyle choices, including:  Daily care of your teeth and gums.  Regular physical activity.  Eating a healthy diet.  Avoiding tobacco and drug use.  Limiting alcohol use.  Practicing safe sex.  Taking low doses of aspirin every day.  Taking vitamin and mineral supplements as recommended by your health care provider. What happens during an annual well check? The services and screenings done by your health care provider during your annual well check will depend on your age, overall health, lifestyle risk factors, and family history of disease. Counseling  Your health care provider may ask you questions about your:  Alcohol use.  Tobacco use.  Drug use.  Emotional well-being.  Home and relationship well-being.  Sexual activity.  Eating habits.  History of falls.  Memory and ability to understand (cognition).  Work and work Statistician. Screening  You may have the following tests or measurements:  Height, weight, and BMI.  Blood pressure.  Lipid and cholesterol levels. These may be checked every 5 years, or more frequently if you are over 50 years old.  Skin check.  Lung cancer screening. You may have this screening every year starting at age 81 if you have a 30-pack-year history of smoking and currently smoke or have quit within the past 15 years.  Fecal occult blood test (FOBT) of the stool. You may have this test every year starting at age 41.  Flexible sigmoidoscopy or colonoscopy. You may have a sigmoidoscopy every 5 years or a colonoscopy every 10 years starting at  age 20.  Prostate cancer screening. Recommendations will vary depending on your family history and other risks.  Hepatitis C blood test.  Hepatitis B blood  test.  Sexually transmitted disease (STD) testing.  Diabetes screening. This is done by checking your blood sugar (glucose) after you have not eaten for a while (fasting). You may have this done every 1-3 years.  Abdominal aortic aneurysm (AAA) screening. You may need this if you are a current or former smoker.  Osteoporosis. You may be screened starting at age 43 if you are at high risk. Talk with your health care provider about your test results, treatment options, and if necessary, the need for more tests. Vaccines  Your health care provider may recommend certain vaccines, such as:  Influenza vaccine. This is recommended every year.  Tetanus, diphtheria, and acellular pertussis (Tdap, Td) vaccine. You may need a Td booster every 10 years.  Zoster vaccine. You may need this after age 84.  Pneumococcal 13-valent conjugate (PCV13) vaccine. One dose is recommended after age 42.  Pneumococcal polysaccharide (PPSV23) vaccine. One dose is recommended after age 72. Talk to your health care provider about which screenings and vaccines you need and how often you need them. This information is not intended to replace advice given to you by your health care provider. Make sure you discuss any questions you have with your health care provider. Document Released: 10/27/2015 Document Revised: 06/19/2016 Document Reviewed: 08/01/2015 Elsevier Interactive Patient Education  2017 [place] Prevention in the Home Falls can cause injuries. They can happen to people of all ages. There are many things you can do to make your home safe and to help prevent falls. What can I do on the outside of my home?  Regularly fix the edges of walkways and driveways and fix any cracks.  Remove anything that might make you trip as you walk through a door, such as a raised step or threshold.  Trim any bushes or trees on the path to your home.  Use bright outdoor lighting.  Clear any walking paths of  anything that might make someone trip, such as rocks or tools.  Regularly check to see if handrails are loose or broken. Make sure that both sides of any steps have handrails.  Any raised decks and porches should have guardrails on the edges.  Have any leaves, snow, or ice cleared regularly.  Use sand or salt on walking paths during winter.  Clean up any spills in your garage right away. This includes oil or grease spills. What can I do in the bathroom?  Use night lights.  Install grab bars by the toilet and in the tub and shower. Do not use towel bars as grab bars.  Use non-skid mats or decals in the tub or shower.  If you need to sit down in the shower, use a plastic, non-slip stool.  Keep the floor dry. Clean up any water that spills on the floor as soon as it happens.  Remove soap buildup in the tub or shower regularly.  Attach bath mats securely with double-sided non-slip rug tape.  Do not have throw rugs and other things on the floor that can make you trip. What can I do in the bedroom?  Use night lights.  Make sure that you have a light by your bed that is easy to reach.  Do not use any sheets or blankets that are too big for your bed. They should not hang down onto the  floor.  Have a firm chair that has side arms. You can use this for support while you get dressed.  Do not have throw rugs and other things on the floor that can make you trip. What can I do in the kitchen?  Clean up any spills right away.  Avoid walking on wet floors.  Keep items that you use a lot in easy-to-reach places.  If you need to reach something above you, use a strong step stool that has a grab bar.  Keep electrical cords out of the way.  Do not use floor polish or wax that makes floors slippery. If you must use wax, use non-skid floor wax.  Do not have throw rugs and other things on the floor that can make you trip. What can I do with my stairs?  Do not leave any items on the  stairs.  Make sure that there are handrails on both sides of the stairs and use them. Fix handrails that are broken or loose. Make sure that handrails are as long as the stairways.  Check any carpeting to make sure that it is firmly attached to the stairs. Fix any carpet that is loose or worn.  Avoid having throw rugs at the top or bottom of the stairs. If you do have throw rugs, attach them to the floor with carpet tape.  Make sure that you have a light switch at the top of the stairs and the bottom of the stairs. If you do not have them, ask someone to add them for you. What else can I do to help prevent falls?  Wear shoes that:  Do not have high heels.  Have rubber bottoms.  Are comfortable and fit you well.  Are closed at the toe. Do not wear sandals.  If you use a stepladder:  Make sure that it is fully opened. Do not climb a closed stepladder.  Make sure that both sides of the stepladder are locked into place.  Ask someone to hold it for you, if possible.  Clearly mark and make sure that you can see:  Any grab bars or handrails.  First and last steps.  Where the edge of each step is.  Use tools that help you move around (mobility aids) if they are needed. These include:  Canes.  Walkers.  Scooters.  Crutches.  Turn on the lights when you go into a dark area. Replace any light bulbs as soon as they burn out.  Set up your furniture so you have a clear path. Avoid moving your furniture around.  If any of your floors are uneven, fix them.  If there are any pets around you, be aware of where they are.  Review your medicines with your doctor. Some medicines can make you feel dizzy. This can increase your chance of falling. Ask your doctor what other things that you can do to help prevent falls. This information is not intended to replace advice given to you by your health care provider. Make sure you discuss any questions you have with your health care  provider. Document Released: 07/27/2009 Document Revised: 03/07/2016 Document Reviewed: 11/04/2014 Elsevier Interactive Patient Education  2017 [employer].

## 2020-08-07 NOTE — Progress Notes (Addendum)
I connected with Benjamin Mckay today by telephone and verified that I am speaking with the correct person using two identifiers. Location patient: home Location provider: work Persons participating in the virtual visit: [employer] and Lisette Abu, LPN.   I discussed the limitations, risks, security and privacy concerns of performing an evaluation and management service by telephone and the availability of in person appointments. I also discussed with the patient that there may be a patient responsible charge related to this service. The patient expressed understanding and verbally consented to this telephonic visit.    Interactive audio and video telecommunications were attempted between this provider and patient, however failed, due to patient having technical difficulties OR patient did not have access to video capability.  We continued and completed visit with audio only.  Some vital signs may be absent or patient reported.   Time Spent with patient on telephone encounter: 20 minutes  Subjective:   Benjamin Mckay is a 74 y.o. male who presents for Medicare Annual/Subsequent preventive examination.  Review of Systems    NO ROS. Medicare Wellness Visit. Cardiac Risk Factors include: advanced age (>71men, >30 women);dyslipidemia;family history of premature cardiovascular disease;hypertension;male gender Sleep Patterns: No sleep issues, feels rested on waking and sleeps 6-7 hours nightly. Home Safety/Smoke Alarms: Feels safe in home; uses home alarm. Smoke alarms in place. Living environment: 1-story home; Lives with spouse; no needs for DME; good support system. Seat Belt Safety/Bike Helmet: Wears seat belt.    Objective:    There were no vitals filed for this visit. There is no height or weight on file to calculate BMI.  Advanced Directives 08/07/2020 04/02/2016 04/01/2016 02/26/2016 12/07/2015 11/13/2015 12/18/2014  Does Patient Have a Medical Advance Directive? Yes No No No No  No No  Type of Advance Directive Living will - - - - - -  Does patient want to make changes to medical advance directive? No - Patient declined - - - - - -  Would patient like information on creating a medical advance directive? - - No - patient declined information - - No - patient declined information No - patient declined information  Pre-existing out of facility DNR order (yellow form or pink MOST form) - - - - - - -    Current Medications (verified) Outpatient Encounter Medications as of 08/07/2020  Medication Sig   acetaminophen (TYLENOL) 500 MG tablet Take 1,000 mg by mouth every 6 (six) hours as needed for moderate pain.    Evolocumab (REPATHA SURECLICK) 009 MG/ML SOAJ Inject 140 mg into the skin every 14 (fourteen) days.   furosemide (LASIX) 20 MG tablet TAKE 1 TO 2 TABLETS BY MOUTH DAILY AS DIRECTED   metoprolol succinate (TOPROL-XL) 25 MG 24 hr tablet TAKE 1 TABLET BY MOUTH TWICE A DAY   pravastatin (PRAVACHOL) 80 MG tablet Take 1 tablet (80 mg total) by mouth daily.   Tamsulosin HCl (FLOMAX) 0.4 MG CAPS Take 0.4 mg by mouth at bedtime.    traZODone (DESYREL) 50 MG tablet TAKE 1/2 TO 1 TABLET BY MOUTH AT BEDTIME AS NEEDED FOR SLEEP   triamcinolone cream (KENALOG) 0.1 % APPLY ON THE SKIN TWICE A DAY AS NEEDED   warfarin (COUMADIN) 2.5 MG tablet Take 1 to 1.5 tablets by mouth daily as directed by the Coumadin Clinic   No facility-administered encounter medications on file as of 08/07/2020.    Allergies (verified) Statins, Adhesive [tape], Latex, and Penicillins   History: Past Medical History:  Diagnosis Date  Atrial fibrillation ([place])    Cardiomyopathy, ischemic 11/07/2013   Cataract    bil cataracts removed   CHB (complete heart block) ([place]) 11/06/2013   Chronic combined systolic and diastolic CHF (congestive heart failure) (H[facility])    Clotting disorder ([facility])    TIA , cva - PT ON COUMADINE   Colon polyps    Colon polyps    Congenital heart block    Constipation    CVA  (cerebral infarction)    Diverticulosis    Esophageal stricture    Family history of adverse reaction to anesthesia    Heart murmur    Hyperlipidemia    Hypertension    Hyperthyroidism 11/16/2015   Lymphoma ([place]) 1998   of colon    Presence of permanent cardiac pacemaker    St. Jude  pt.states he is totally dependent on pacemaker   Shortness of breath dyspnea    Pulmonary Effusion   Stroke ([place])    TIA, cva   Umbilical hernia    Urinary frequency    Past Surgical History:  Procedure Laterality Date   APPENDECTOMY  1953   CARDIAC CATHETERIZATION  01/06/2003   Recommend medical therapy   CARDIOVASCULAR STRESS TEST  07/16/2012   Mild-moderate perfusion defect seen in Basal inferior, Mid inferior, and Apica lateral consistent with infarct/scar. No scintigraphic evidence for inducible myocardial ischemia. No ECG changes. EKG negative for ischemia.   CAROTID DOPPLER  11/04/2012   Proximal Rt ICA 50-99% diameter reduction; Lft Bulb demonstrated mild amount homogeneous plaque-not hemodynamically significant; Lft ICA-normal patency.   CHEST TUBE INSERTION Right 03/15/2016   Procedure: INSERTION PLEURAL DRAINAGE CATHETER;  Surgeon: Ivin Poot, MD;  Location: [facility];  Service: Thoracic;  Laterality: Right;   COLECTOMY     for lymphoma   COLONOSCOPY     neck fusion     PACEMAKER GENERATOR CHANGE  01/31/2012   St Jude Med Accent DR RF model [id number] serial [phone] [id number]   PACEMAKER PLACEMENT     replaced 3 x   PERMANENT PACEMAKER GENERATOR CHANGE N/A 01/31/2012   Procedure: PERMANENT PACEMAKER GENERATOR CHANGE;  Surgeon: Sanda Klein, MD;  Location: [facility] CATH [facility];    PLEURADESIS N/A 04/02/2016   Procedure: Antionette Poles;  Surgeon: Ivin Poot, MD;  Location: [facility];  Service: Thoracic;  Laterality: N/A;   REMOVAL OF PLEURAL DRAINAGE CATHETER Right 07/26/2016   Procedure: REMOVAL OF PLEURAL DRAINAGE CATHETER;  Surgeon: Ivin Poot, MD;  Location[facility];  Service: Thoracic;  Laterality: Right;    TONSILLECTOMY     TRANSTHORACIC ECHOCARDIOGRAM  11/04/2012   EF 35-00%, systolic function moderately reduced, mild regurg of the aortic and mitral valves.   VIDEO ASSISTED THORACOSCOPY Right 04/02/2016   Procedure: Right VIDEO ASSISTED THORACOSCOPY with Biopsies and drainage pleural effusion;  Surgeon: Ivin Poot, MD;  Location: [facility] OR;  Service: Thoracic;  Laterality: Right;   Family History  Problem Relation Age of Onset   Prostate cancer Father    Heart disease Mother    Stroke Mother    Diabetes Maternal Grandmother    Colon cancer Neg Hx    Heart attack Neg Hx    Esophageal cancer Neg Hx    Stomach cancer Neg Hx    Rectal cancer Neg Hx    Social History   Socioeconomic History   Marital status: Married    Spouse name: Chartered certified accountant   Number of children: 3   Years of education: 16   Highest education level: Not  on file  Occupational History   Occupation: Retired  Tobacco Use   Smoking status: Never Smoker   Smokeless tobacco: Never Used  Scientific laboratory technician Use: Never used  Substance and Sexual Activity   Alcohol use: No    Alcohol/week: 0.0 standard drinks   Drug use: No   Sexual activity: Yes  Other Topics Concern   Not on file  Social History Narrative   Fun: Golf when he can   Consumes caffeine 2 cups per day     Social Determinants of Health   Financial Resource Strain: Low Risk    Difficulty of Paying Living Expenses: Not hard at all  Food Insecurity: No Food Insecurity   Worried About Charity fundraiser in the Last Year: Never true   Arboriculturist in the Last Year: Never true  Transportation Needs: No Transportation Needs   Lack of Transportation (Medical): No   Lack of Transportation (Non-Medical): No  Physical Activity: Sufficiently Active   Days of Exercise per Week: 5 days   Minutes of Exercise per Session: 30 min  Stress: No Stress Concern Present   Feeling of Stress : Not at all  Social Connections: Socially Integrated   Frequency of  Communication with Friends and Family: More than three times a week   Frequency of Social Gatherings with Friends and Family: More than three times a week   Attends Religious Services: More than 4 times per year   Active Member of [employer] or Organizations: Yes   Attends Music therapist: More than 4 times per year   Marital Status: Married    Tobacco Counseling Counseling given: Not Answered   Clinical Intake:  Pre-visit preparation completed: Yes  Pain : No/denies pain     Nutritional Risks: None Diabetes: No  How often do you need to have someone help you when you read instructions, pamphlets, or other written materials from your doctor or pharmacy?: 1 - Never What is the last grade level you completed in school?: Bachelor's Degree  Diabetic? no  Interpreter Needed?: No  Information entered by :: Lisette Abu, LPN   Activities of Daily Living In your present state of health, do you have any difficulty performing the following activities: 08/07/2020 07/28/2020  Hearing? N N  Vision? N N  Difficulty concentrating or making decisions? N N  Walking or climbing stairs? N N  Dressing or bathing? N N  Doing errands, shopping? N N  Preparing Food and eating ? N -  Using the Toilet? N -  In the past six months, have you accidently leaked urine? N -  Do you have problems with loss of bowel control? N -  Managing your Medications? N -  Managing your Finances? N -  Housekeeping or managing your Housekeeping? N -  Some recent data might be hidden    Patient Care Team: [name], FNP as PCP - General (Internal Medicine) Croitoru, Dani Gobble, MD as PCP - Cardiology (Cardiology)  Indicate any recent Medical Services you may have received from other than [facility] providers in the past year (date may be approximate).     Assessment:   This is a routine wellness examination for Benjamin Mckay.  Hearing/Vision screen No exam data present  Dietary issues and  exercise activities discussed: Current Exercise Habits: Home exercise routine, Type of exercise: walking;yoga (golfing), Time (Minutes): 30, Frequency (Times/Week): 5, Weekly Exercise (Minutes/Week): 150, Intensity: Moderate, Exercise limited by: cardiac condition(s)  Goals  None    Depression Screen PHQ 2/9 Scores 08/07/2020 07/28/2020 07/23/2018 07/21/2017 02/05/2016 11/13/2015 10/27/2015  PHQ - 2 Score 0 0 0 0 0 0 0    Fall Risk Fall Risk  08/07/2020 09/08/2019 07/23/2018 07/21/2017 02/05/2016  Falls in the past year? 0 0 No No No  Comment - Emmi Telephone Survey: data to providers prior to load - - -  Number falls in past yr: 0 - - - -  Injury with Fall? 0 - - - -  Risk for fall due to : No Fall Risks - - - -  Follow up Falls evaluation completed - - - -    Any stairs in or around the home? No  If so, are there any without handrails? No  Home free of loose throw rugs in walkways, pet beds, electrical cords, etc? Yes  Adequate lighting in your home to reduce risk of falls? Yes   ASSISTIVE DEVICES UTILIZED TO PREVENT FALLS:  Life alert? No  Use of a cane, walker or w/c? No  Grab bars in the bathroom? No  Shower chair or bench in shower? No  Elevated toilet seat or a handicapped toilet? No   TIMED UP AND GO:  Was the test performed? No .  Length of time to ambulate 10 feet: 0 sec.   Gait steady and fast without use of assistive device  Cognitive Function: Patient is cogitatively intact.        Immunizations Immunization History  Administered Date(s) Administered   Fluad Quad(high Dose 65+) 07/26/2019, 07/28/2020   Influenza, High Dose Seasonal PF 07/21/2017, 07/23/2018   Influenza,inj,Quad PF,6+ Mos 07/15/2015   Pneumococcal Conjugate-13 09/10/2016   Pneumococcal Polysaccharide-23 02/01/2012    TDAP status: Due, Education has been provided regarding the importance of this vaccine. Advised may receive this vaccine at local pharmacy or Health Dept. Aware to provide a  copy of the vaccination record if obtained from local pharmacy or Health Dept. Verbalized acceptance and understanding. Flu Vaccine status: Up to date Pneumococcal vaccine status: Up to date Covid-19 vaccine status: Completed vaccines  Qualifies for Shingles Vaccine? Yes   Zostavax completed No   Shingrix Completed?: No.    Education has been provided regarding the importance of this vaccine. Patient has been advised to call insurance company to determine out of pocket expense if they have not yet received this vaccine. Advised may also receive vaccine at local pharmacy or Health Dept. Verbalized acceptance and understanding.  Screening Tests Health Maintenance  Topic Date Due   COVID-19 Vaccine (1) Never done   TETANUS/TDAP  07/28/2021 (Originally 12/21/1964)   INFLUENZA VACCINE  Completed   Hepatitis C Screening  Completed   PNA vac Low Risk Adult  Completed    Health Maintenance  Health Maintenance Due  Topic Date Due   COVID-19 Vaccine (1) Never done    Colorectal cancer screening: Completed 09/22/2019. Repeat every 0 years  Lung Cancer Screening: (Low Dose CT Chest recommended if Age 45-80 years, 30 pack-year currently smoking OR have quit w/in 15years.) does not qualify.   Lung Cancer Screening Referral: no  Additional Screening:  Hepatitis C Screening: does qualify; Completed yes  Vision Screening: Recommended annual ophthalmology exams for early detection of glaucoma and other disorders of the eye. Is the patient up to date with their annual eye exam?  Yes  Who is the provider or what is the name of the office in which the patient attends annual eye exams? [facility] If pt  is not established with a provider, would they like to be referred to a provider to establish care? No .   Dental Screening: Recommended annual dental exams for proper oral hygiene  Community Resource Referral / Chronic Care Management: CRR required this visit?  No   CCM required this visit?   No      Plan:     I have personally reviewed and noted the following in the patient's chart:   Medical and social history Use of alcohol, tobacco or illicit drugs  Current medications and supplements Functional ability and status Nutritional status Physical activity Advanced directives List of other physicians Hospitalizations, surgeries, and ER visits in previous 12 months Vitals Screenings to include cognitive, depression, and falls Referrals and appointments  In addition, I have reviewed and discussed with patient certain preventive protocols, quality metrics, and best practice recommendations. A written personalized care plan for preventive services as well as general preventive health recommendations were provided to patient.     Sheral Flow, LPN   08/15/1116   Nurse Notes:  Patient is cogitatively intact. There were no vitals filed for this visit. There is no height or weight on file to calculate BMI. Patient stated that he has no issues with gait or balance; does not use any assistive devices.  Medical screening examination/treatment/procedure(s) were performed by non-physician practitioner and as supervising physician I was immediately available for consultation/collaboration.  I agree with above. Lew Dawes, MD

## 2020-08-16 ENCOUNTER — Other Ambulatory Visit (INDEPENDENT_AMBULATORY_CARE_PROVIDER_SITE_OTHER): Payer: PPO

## 2020-08-16 ENCOUNTER — Telehealth: Payer: Self-pay | Admitting: Family

## 2020-08-16 ENCOUNTER — Other Ambulatory Visit: Payer: PPO | Admitting: Family

## 2020-08-16 ENCOUNTER — Other Ambulatory Visit: Payer: Self-pay

## 2020-08-16 ENCOUNTER — Other Ambulatory Visit: Payer: Self-pay | Admitting: Family

## 2020-08-16 DIAGNOSIS — R7989 Other specified abnormal findings of blood chemistry: Secondary | ICD-10-CM | POA: Diagnosis not present

## 2020-08-16 DIAGNOSIS — R17 Unspecified jaundice: Secondary | ICD-10-CM

## 2020-08-16 LAB — HEPATIC FUNCTION PANEL
ALT: 12 U/L (ref 0–53)
AST: 16 U/L (ref 0–37)
Albumin: 3.8 g/dL (ref 3.5–5.2)
Alkaline Phosphatase: 79 U/L (ref 39–117)
Bilirubin, Direct: 0.4 mg/dL — ABNORMAL HIGH (ref 0.0–0.3)
Total Bilirubin: 1.7 mg/dL — ABNORMAL HIGH (ref 0.2–1.2)
Total Protein: 7 g/dL (ref 6.0–8.3)

## 2020-08-16 NOTE — Telephone Encounter (Signed)
Patient just got a call to make an appointment for a ultrasound and he doesn't know why he needs one, states he was never consulted for a reason on why he would need one.   Patient # [phone]

## 2020-08-28 ENCOUNTER — Ambulatory Visit
Admission: RE | Admit: 2020-08-28 | Discharge: 2020-08-28 | Disposition: A | Discharge status: Home / Self Care | Payer: PPO | Source: Ambulatory Visit | Attending: Family | Admitting: Family

## 2020-08-28 DIAGNOSIS — R17 Unspecified jaundice: Secondary | ICD-10-CM

## 2020-08-28 DIAGNOSIS — N289 Disorder of kidney and ureter, unspecified: Secondary | ICD-10-CM | POA: Diagnosis not present

## 2020-08-29 ENCOUNTER — Other Ambulatory Visit: Payer: Self-pay | Admitting: Family

## 2020-08-29 DIAGNOSIS — N281 Cyst of kidney, acquired: Secondary | ICD-10-CM

## 2020-08-29 DIAGNOSIS — K760 Fatty (change of) liver, not elsewhere classified: Secondary | ICD-10-CM

## 2020-08-30 ENCOUNTER — Ambulatory Visit (INDEPENDENT_AMBULATORY_CARE_PROVIDER_SITE_OTHER): Payer: PPO

## 2020-08-30 DIAGNOSIS — I482 Chronic atrial fibrillation, unspecified: Secondary | ICD-10-CM

## 2020-08-30 LAB — CUP PACEART REMOTE DEVICE CHECK
Battery Remaining Longevity: 61 mo
Battery Remaining Percentage: 51 %
Battery Voltage: 2.87 V
Brady Statistic RV Percent Paced: 56 %
Date Time Interrogation Session: 20211117034816
Implantable Lead Implant Date: 20130419
Implantable Lead Location: 753860
Implantable Pulse Generator Implant Date: 20130419
Lead Channel Impedance Value: 440 Ohm
Lead Channel Pacing Threshold Amplitude: 0.75 V
Lead Channel Pacing Threshold Pulse Width: 0.5 ms
Lead Channel Sensing Intrinsic Amplitude: 9.5 mV
Lead Channel Setting Pacing Amplitude: 2.5 V
Lead Channel Setting Pacing Pulse Width: 0.5 ms
Lead Channel Setting Sensing Sensitivity: 2.5 mV
Pulse Gen Model: 1210
Pulse Gen Serial Number: 7313697

## 2020-09-01 NOTE — Progress Notes (Signed)
Remote pacemaker transmission.   

## 2020-09-06 ENCOUNTER — Other Ambulatory Visit: Payer: Self-pay

## 2020-09-06 ENCOUNTER — Ambulatory Visit (INDEPENDENT_AMBULATORY_CARE_PROVIDER_SITE_OTHER): Payer: PPO

## 2020-09-06 DIAGNOSIS — Z7901 Long term (current) use of anticoagulants: Secondary | ICD-10-CM

## 2020-09-06 DIAGNOSIS — I482 Chronic atrial fibrillation, unspecified: Secondary | ICD-10-CM | POA: Diagnosis not present

## 2020-09-06 LAB — POCT INR: INR: 3 (ref 2.0–3.0)

## 2020-09-06 NOTE — Patient Instructions (Signed)
continue taking 1.5 tablets every day. Repeat INR in 6 weeks.

## 2020-09-19 ENCOUNTER — Ambulatory Visit
Admission: RE | Admit: 2020-09-19 | Discharge: 2020-09-19 | Disposition: A | Discharge status: Home / Self Care | Payer: PPO | Source: Ambulatory Visit | Attending: Family | Admitting: Family

## 2020-09-19 ENCOUNTER — Other Ambulatory Visit: Payer: Self-pay

## 2020-09-19 DIAGNOSIS — K760 Fatty (change of) liver, not elsewhere classified: Secondary | ICD-10-CM

## 2020-09-19 DIAGNOSIS — N281 Cyst of kidney, acquired: Secondary | ICD-10-CM | POA: Diagnosis not present

## 2020-09-19 MED ORDER — IOPAMIDOL (ISOVUE-300) INJECTION 61%
100.0000 mL | Freq: Once | INTRAVENOUS | Status: AC | PRN
  Administered 2020-09-19: 100 mL via INTRAVENOUS

## 2020-09-22 ENCOUNTER — Other Ambulatory Visit: Payer: Self-pay | Admitting: Cardiovascular Disease

## 2020-09-25 DIAGNOSIS — L570 Actinic keratosis: Secondary | ICD-10-CM | POA: Diagnosis not present

## 2020-10-02 DIAGNOSIS — R3121 Asymptomatic microscopic hematuria: Secondary | ICD-10-CM | POA: Diagnosis not present

## 2020-10-02 DIAGNOSIS — N401 Enlarged prostate with lower urinary tract symptoms: Secondary | ICD-10-CM | POA: Diagnosis not present

## 2020-10-02 DIAGNOSIS — N281 Cyst of kidney, acquired: Secondary | ICD-10-CM | POA: Diagnosis not present

## 2020-10-02 DIAGNOSIS — R350 Frequency of micturition: Secondary | ICD-10-CM | POA: Diagnosis not present

## 2020-10-18 ENCOUNTER — Other Ambulatory Visit: Payer: Self-pay

## 2020-10-18 ENCOUNTER — Ambulatory Visit (INDEPENDENT_AMBULATORY_CARE_PROVIDER_SITE_OTHER): Payer: PPO

## 2020-10-18 DIAGNOSIS — I482 Chronic atrial fibrillation, unspecified: Secondary | ICD-10-CM | POA: Diagnosis not present

## 2020-10-18 DIAGNOSIS — Z7901 Long term (current) use of anticoagulants: Secondary | ICD-10-CM

## 2020-10-18 LAB — POCT INR: INR: 3.9 — AB (ref 2.0–3.0)

## 2020-10-18 NOTE — Patient Instructions (Signed)
Hold tonight only and then continue taking 1.5 tablets every day. Repeat INR in 6 weeks.

## 2020-10-20 ENCOUNTER — Other Ambulatory Visit: Payer: Self-pay

## 2020-10-20 ENCOUNTER — Ambulatory Visit (INDEPENDENT_AMBULATORY_CARE_PROVIDER_SITE_OTHER): Payer: PPO | Admitting: Family

## 2020-10-20 ENCOUNTER — Encounter: Payer: Self-pay | Admitting: Family

## 2020-10-20 VITALS — BP 148/84 | HR 79 | Temp 97.6°F | Wt 210.2 lb

## 2020-10-20 DIAGNOSIS — L0390 Cellulitis, unspecified: Secondary | ICD-10-CM

## 2020-10-20 MED ORDER — DOXYCYCLINE HYCLATE 100 MG PO TABS: 100.0000 mg | ORAL_TABLET | Freq: Two times a day (BID) | ORAL | 0 refills | Status: DC

## 2020-10-20 MED ORDER — MUPIROCIN 2 % EX OINT: 1.0000 "application " | TOPICAL_OINTMENT | Freq: Two times a day (BID) | CUTANEOUS | 0 refills | Status: DC

## 2020-10-20 NOTE — Progress Notes (Signed)
Benjamin Mckay is a 75 y.o. male with the following history as recorded in EpicCare:  Patient Active Problem List   Diagnosis Date Noted  . Medicare annual wellness visit, subsequent 07/21/2017  . Routine adult health maintenance 07/21/2017  . Pleural effusion, malignant 10/02/2016  . Rotator cuff tendinitis 09/10/2016  . Ventricular tachycardia ([place]) 06/12/2016  . 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 Insomnia 03/08/2016  . Solitary pulmonary nodule 01/09/2016  . Hypercholesterolemia 12/12/2015  . History of sustained ventricular tachycardia 01/30/2015  . Chronic combined systolic (congestive) and diastolic (congestive) heart failure ([place]) 12/18/2014  . Warfarin anticoagulation 12/08/2014  . ARF (acute renal failure) ([place]) 12/07/2014  . Cardiomyopathy, ischemic 11/07/2013  . CHB (complete heart block) ([place]) 11/06/2013  . Single chamber St. Jude pacemaker 2013 11/06/2013  . Chronic atrial fibrillation ([place]) 01/29/2013  . Long term (current) use of anticoagulants 01/29/2013  . HTN (hypertension) 02/01/2012  . Carotid stenosis 02/01/2012  . Renal artery stenosis ([place]) 02/01/2012  . Coronary artery disease involving native coronary artery of native heart with angina pectoris ([place]) 02/01/2012  . Esophageal reflux 08/21/2011  . Hemorrhage of rectum and anus 04/26/2011  . HERNIA, UMBILICAL [id number]  . UNSPECIFIED CARDIAC DYSRHYTHMIA 11/11/2007  . Transient cerebral ischemia 11/11/2007  . ESOPHAGEAL STRICTURE 11/11/2007  . DIVERTICULOSIS OF COLON 11/11/2007  . Non-Hodgkin's lymphoma of intestine ([place]) 09/03/2007    Current Outpatient Medications  Medication Sig Dispense Refill  . acetaminophen (TYLENOL) 500 MG tablet Take 1,000 mg by mouth every 6 (six) hours as needed for moderate pain.     Marland Kitchen doxycycline (VIBRA-TABS) 100 MG tablet Take 1 tablet (100 mg total) by mouth 2 (two) times daily. 14 tablet 0  . Evolocumab (REPATHA SURECLICK) XX123456 MG/ML SOAJ Inject 140 mg into  the skin every 14 (fourteen) days. 2 pen 11  . furosemide (LASIX) 20 MG tablet TAKE 1 TO 2 TABLETS BY MOUTH DAILY AS DIRECTED 180 tablet 1  . metoprolol succinate (TOPROL-XL) 25 MG 24 hr tablet TAKE 1 TABLET BY MOUTH TWICE A DAY 180 tablet 3  . mupirocin ointment (BACTROBAN) 2 % Apply 1 application topically 2 (two) times daily. 22 g 0  . pravastatin (PRAVACHOL) 80 MG tablet Take 1 tablet (80 mg total) by mouth daily. 90 tablet 1  . tamsulosin (FLOMAX) 0.4 MG CAPS capsule Take 0.4 mg by mouth at bedtime.    . traZODone (DESYREL) 50 MG tablet TAKE 1/2 TO 1 TABLET BY MOUTH AT BEDTIME AS NEEDED FOR SLEEP 90 tablet 3  . triamcinolone cream (KENALOG) 0.1 % APPLY ON THE SKIN TWICE A DAY AS NEEDED    . warfarin (COUMADIN) 2.5 MG tablet TAKE 1 TO 1.5 TABLETS BY MOUTH DAILY AS DIRECTED BY THE COUMADIN CLINIC 135 tablet 1   No current facility-administered medications for this visit.    Allergies: Statins, Adhesive [tape], Latex, and Penicillins  Past Medical History:  Diagnosis Date  . Atrial fibrillation ([place])   . Cardiomyopathy, ischemic 11/07/2013  . Cataract    bil cataracts removed  . CHB (complete heart block) ([place]) 11/06/2013  . Chronic combined systolic and diastolic CHF (congestive heart failure) ([place])   . Clotting disorder ([facility])    TIA , cva - PT ON COUMADINE  . Colon polyps   . Colon polyps   . Congenital heart block   . Constipation   . CVA (cerebral infarction)   . Diverticulosis   . Esophageal stricture   . Family history of adverse reaction to anesthesia   .  Heart murmur   . Hyperlipidemia   . Hypertension   . Hyperthyroidism 11/16/2015  . Lymphoma ([place]) 1998   of colon   . Presence of permanent cardiac pacemaker    St. Jude  pt.states he is totally dependent on pacemaker  . Shortness of breath dyspnea    Pulmonary Effusion  . Stroke [facility])    TIA, cva  . Umbilical hernia   . Urinary frequency     Past Surgical History:  Procedure Laterality Date  . APPENDECTOMY  1953   . CARDIAC CATHETERIZATION  01/06/2003   Recommend medical therapy  . CARDIOVASCULAR STRESS TEST  07/16/2012   Mild-moderate perfusion defect seen in Basal inferior, Mid inferior, and Apica lateral consistent with infarct/scar. No scintigraphic evidence for inducible myocardial ischemia. No ECG changes. EKG negative for ischemia.  Marland Kitchen CAROTID DOPPLER  11/04/2012   Proximal Rt ICA 50-99% diameter reduction; Lft Bulb demonstrated mild amount homogeneous plaque-not hemodynamically significant; Lft ICA-normal patency.  . CHEST TUBE INSERTION Right 03/15/2016   Procedure: INSERTION PLEURAL DRAINAGE CATHETER;  Surgeon: Ivin Poot, MD;  Location: [facility];  Service: Thoracic;  Laterality: Right;  . COLECTOMY     for lymphoma  . COLONOSCOPY    . neck fusion    . PACEMAKER GENERATOR CHANGE  01/31/2012   St Jude Med Accent DR RF model [id number] serial [id number]  . PACEMAKER PLACEMENT     replaced 3 x  . PERMANENT PACEMAKER GENERATOR CHANGE N/A 01/31/2012   Procedure: PERMANENT PACEMAKER GENERATOR CHANGE;  Surgeon: Sanda Klein, MD;  Location: [facility] CATH LAB;   . PLEURADESIS N/A 04/02/2016   Procedure: PLEURADESIS;  Surgeon: Ivin Poot, MD;  Location: [facility];  Service: Thoracic;  Laterality: N/A;  . REMOVAL OF PLEURAL DRAINAGE CATHETER Right 07/26/2016   Procedure: REMOVAL OF PLEURAL DRAINAGE CATHETER;  Surgeon: Ivin Poot, MD;  Location: [facility];  Service: Thoracic;  Laterality: Right;  . TONSILLECTOMY    . TRANSTHORACIC ECHOCARDIOGRAM  11/04/2012   EF 45-62%, systolic function moderately reduced, mild regurg of the aortic and mitral valves.  Marland Kitchen VIDEO ASSISTED THORACOSCOPY Right 04/02/2016   Procedure: Right VIDEO ASSISTED THORACOSCOPY with Biopsies and drainage pleural effusion;  Surgeon: Ivin Poot, MD;  Location: [facility] OR;  Service: Thoracic;  Laterality: Right;    Family History  Problem Relation Age of Onset  . Prostate cancer Father   . Heart disease Mother   . Stroke Mother   . Diabetes  Maternal Grandmother   . Colon cancer Neg Hx   . Heart attack Neg Hx   . Esophageal cancer Neg Hx   . Stomach cancer Neg Hx   . Rectal cancer Neg Hx     Social History   Tobacco Use  . Smoking status: Never Smoker  . Smokeless tobacco: Never Used  Substance Use Topics  . Alcohol use: No    Alcohol/week: 0.0 standard drinks    Subjective:  Known umbilical hernia; concerned for skin infection- notes that belly button has been red/ irritated x 3-4 days; no fever;       Objective:  Vitals:   10/20/20 1615  BP: (!) 148/84  Pulse: 79  Temp: 97.6 F (36.4 C)  TempSrc: Oral  SpO2: 96%  Weight: 210 lb 3.2 oz (95.3 kg)    General: Well developed, well nourished, in no acute distress  Skin : Warm and dry. Erythema noted over right side of umbilicus Head: Normocephalic and atraumatic  Lungs: Respirations unlabored;  Neurologic:  Alert and oriented; speech intact; face symmetrical; moves all extremities well; CNII-XII intact without focal deficit   Assessment:  1. Cellulitis, unspecified cellulitis site     Plan:  Rx for Doxycycline 100 mg bid x 7 days and Bactroban to apply bid to affected area; Keep planned follow up to get his INR re-checked;  This visit occurred during the SARS-CoV-2 public health emergency.  Safety protocols were in place, including screening questions prior to the visit, additional usage of staff PPE, and extensive cleaning of exam room while observing appropriate contact time as indicated for disinfecting solutions.      No follow-ups on file.  No orders of the defined types were placed in this encounter.   Requested Prescriptions   Signed Prescriptions Disp Refills  . doxycycline (VIBRA-TABS) 100 MG tablet 14 tablet 0    Sig: Take 1 tablet (100 mg total) by mouth 2 (two) times daily.  . mupirocin ointment (BACTROBAN) 2 % 22 g 0    Sig: Apply 1 application topically 2 (two) times daily.

## 2020-11-17 DIAGNOSIS — H43812 Vitreous degeneration, left eye: Secondary | ICD-10-CM | POA: Diagnosis not present

## 2020-11-17 DIAGNOSIS — H26493 Other secondary cataract, bilateral: Secondary | ICD-10-CM | POA: Diagnosis not present

## 2020-11-17 DIAGNOSIS — Z961 Presence of intraocular lens: Secondary | ICD-10-CM | POA: Diagnosis not present

## 2020-11-29 ENCOUNTER — Ambulatory Visit (INDEPENDENT_AMBULATORY_CARE_PROVIDER_SITE_OTHER): Payer: PPO

## 2020-11-29 ENCOUNTER — Other Ambulatory Visit: Payer: Self-pay

## 2020-11-29 DIAGNOSIS — I482 Chronic atrial fibrillation, unspecified: Secondary | ICD-10-CM

## 2020-11-29 DIAGNOSIS — I442 Atrioventricular block, complete: Secondary | ICD-10-CM | POA: Diagnosis not present

## 2020-11-29 DIAGNOSIS — Z7901 Long term (current) use of anticoagulants: Secondary | ICD-10-CM

## 2020-11-29 LAB — CUP PACEART REMOTE DEVICE CHECK
Battery Remaining Longevity: 61 mo
Battery Remaining Percentage: 51 %
Battery Voltage: 2.87 V
Brady Statistic RV Percent Paced: 55 %
Date Time Interrogation Session: 20220216021541
Implantable Lead Implant Date: 20130419
Implantable Lead Location: 753860
Implantable Pulse Generator Implant Date: 20130419
Lead Channel Impedance Value: 430 Ohm
Lead Channel Pacing Threshold Amplitude: 0.75 V
Lead Channel Pacing Threshold Pulse Width: 0.5 ms
Lead Channel Sensing Intrinsic Amplitude: 8.1 mV
Lead Channel Setting Pacing Amplitude: 2.5 V
Lead Channel Setting Pacing Pulse Width: 0.5 ms
Lead Channel Setting Sensing Sensitivity: 2.5 mV
Pulse Gen Model: 1210
Pulse Gen Serial Number: 7313697

## 2020-11-29 LAB — POCT INR: INR: 3.3 — AB (ref 2.0–3.0)

## 2020-11-29 NOTE — Patient Instructions (Signed)
Hold tonight only and then decrease to 1.5 tablets every day except 1 tablet Monday and Friday. Repeat INR in 4 weeks.

## 2020-12-06 NOTE — Progress Notes (Signed)
Remote pacemaker transmission.   

## 2020-12-17 ENCOUNTER — Other Ambulatory Visit: Payer: Self-pay | Admitting: Cardiovascular Disease

## 2020-12-25 ENCOUNTER — Ambulatory Visit (INDEPENDENT_AMBULATORY_CARE_PROVIDER_SITE_OTHER): Payer: PPO

## 2020-12-25 ENCOUNTER — Encounter: Payer: Self-pay | Admitting: Cardiovascular Disease

## 2020-12-25 ENCOUNTER — Other Ambulatory Visit: Payer: Self-pay

## 2020-12-25 ENCOUNTER — Ambulatory Visit (INDEPENDENT_AMBULATORY_CARE_PROVIDER_SITE_OTHER): Payer: PPO | Admitting: Cardiovascular Disease

## 2020-12-25 VITALS — BP 146/78 | HR 65 | Ht 69.0 in | Wt 211.4 lb

## 2020-12-25 DIAGNOSIS — I5042 Chronic combined systolic (congestive) and diastolic (congestive) heart failure: Secondary | ICD-10-CM

## 2020-12-25 DIAGNOSIS — I482 Chronic atrial fibrillation, unspecified: Secondary | ICD-10-CM

## 2020-12-25 DIAGNOSIS — Z7901 Long term (current) use of anticoagulants: Secondary | ICD-10-CM

## 2020-12-25 DIAGNOSIS — I10 Essential (primary) hypertension: Secondary | ICD-10-CM

## 2020-12-25 DIAGNOSIS — I442 Atrioventricular block, complete: Secondary | ICD-10-CM

## 2020-12-25 DIAGNOSIS — E7800 Pure hypercholesterolemia, unspecified: Secondary | ICD-10-CM | POA: Diagnosis not present

## 2020-12-25 DIAGNOSIS — Z950 Presence of cardiac pacemaker: Secondary | ICD-10-CM | POA: Diagnosis not present

## 2020-12-25 DIAGNOSIS — J90 Pleural effusion, not elsewhere classified: Secondary | ICD-10-CM | POA: Diagnosis not present

## 2020-12-25 DIAGNOSIS — I2510 Atherosclerotic heart disease of native coronary artery without angina pectoris: Secondary | ICD-10-CM

## 2020-12-25 DIAGNOSIS — I701 Atherosclerosis of renal artery: Secondary | ICD-10-CM | POA: Diagnosis not present

## 2020-12-25 DIAGNOSIS — I472 Ventricular tachycardia, unspecified: Secondary | ICD-10-CM

## 2020-12-25 LAB — POCT INR: INR: 2 (ref 2.0–3.0)

## 2020-12-25 MED ORDER — SPIRONOLACTONE 25 MG PO TABS: 25.0000 mg | ORAL_TABLET | Freq: Every day | ORAL | 3 refills | Status: DC

## 2020-12-25 NOTE — Patient Instructions (Signed)
Continue taking 1.5 tablets every day except 1 tablet Monday and Friday. Repeat INR in 6 weeks. 

## 2020-12-25 NOTE — Patient Instructions (Signed)
Medication Instructions:  START Spironolactone 25 mg once daily  *If you need a refill on your cardiac medications before your next appointment, please call your pharmacy*   Lab Work: BMET at you next coumadin appointment  If you have labs (blood work) drawn today and your tests are completely normal, you will receive your results only by: Marland Kitchen MyChart Message (if you have MyChart) OR . A paper copy in the mail If you have any lab test that is abnormal or we need to change your treatment, we will call you to review the results.   Testing/Procedures: None ordered   Follow-Up: At [facility], you and your health needs are our priority.  As part of our continuing mission to provide you with exceptional heart care, we have created designated Provider Care Teams.  These Care Teams include your primary Cardiologist (physician) and Advanced Practice Providers (APPs -  Physician Assistants and Nurse Practitioners) who all work together to provide you with the care you need, when you need it.  We recommend signing up for the patient portal called "MyChart".  Sign up information is provided on this After Visit Summary.  MyChart is used to connect with patients for Virtual Visits (Telemedicine).  Patients are able to view lab/test results, encounter notes, upcoming appointments, etc.  Non-urgent messages can be sent to your provider as well.   To learn more about what you can do with MyChart, go to [email or web address].    Your next appointment:   12 month(s)  The format for your next appointment:   In Person  Provider:   Sanda Klein, MD

## 2020-12-25 NOTE — Progress Notes (Signed)
. Patient ID: Benjamin Mckay, male   DOB: 04/02/1946, 75 y.o.   MRN: [id number] Patient ID: Benjamin Mckay, male   DOB: [birth year], 75 y.o.   MRN: [id number]    Cardiology Office Note    Date:  12/27/2020   ID:  Benjamin Mckay, DOB 12-May-1946, MRN [id number]  PCP:  [name], FNP  Cardiologist:   Sanda Klein, MD   Chief complaint: AFib, Pacemaker check   History of Present Illness:  Benjamin Mckay is a 75 y.o. male with complex cardiac history (intermittent complete heart block-pacemaker dependent, chronic atrial fibrillation versus atrial standstill, history of sustained ventricular tachycardia and near syncope, coronary artery disease with history of inferior wall scar due to myocardial infarction, renal and carotid artery stenosis), amiodarone induced hyperthyroidism (now quiescent) and a recurrent right pleural effusion (s/p pleurodesis June 2017).  Overall feels quite well.  He plays golf a couple of times a week.  Rides the cart, but denies exertional dyspnea.  No orthopnea, PND, chest pain, palpitations, dizziness or syncope.  Unaware of any heart rhythm changes.  Has not been checking his blood pressure (which is a little high today).  He takes furosemide 20 mg a day and that once or twice a month will double the dose due to changes in weight and lower extremity edema.  These are not accompanied by changes in his bradycardia.  Denies orthostatic dizziness.  Has not had any bleeding problems and is compliant with warfarin anticoagulation.  Saint Jude Accent (2013) single-chamber pacemaker interrogation shows normal device function.  Estimated generator longevity 5 years.  He has 55% ventricular pacing.  He has had 11 episodes of high ventricular rate in the last couple of years.  Some of these are clearly atrial fibrillation rapid ventricular response, but many of the others show heart regular rhythm with a cycle length of 340 ms.  1 electrogram shows clear transitioning  from the regular tachyarrhythmia to atrial fibrillation with variable rates, accompanied by a change electrogram which becomes narrower during atrial fibrillation with RVR.  His most recent LDL cholesterol is 95 on pravastatin at maximum dose.  He has not tolerated more potent statins despite multiple attempts with multiple agents.  His HDL is better at 42.   Past Medical History:  Diagnosis Date  . Atrial fibrillation ([place])   . Cardiomyopathy, ischemic 11/07/2013  . Cataract    bil cataracts removed  . CHB (complete heart block) ([place]) 11/06/2013  . Chronic combined systolic and diastolic CHF (congestive heart failure) ([place])   . Clotting disorder (HCC)    TIA , cva - PT ON COUMADINE  . Colon polyps   . Colon polyps   . Congenital heart block   . Constipation   . CVA (cerebral infarction)   . Diverticulosis   . Esophageal stricture   . Family history of adverse reaction to anesthesia   . Heart murmur   . Hyperlipidemia   . Hypertension   . Hyperthyroidism 11/16/2015  . Lymphoma ([place]) 1998   of colon   . Presence of permanent cardiac pacemaker    St. Jude  pt.states he is totally dependent on pacemaker  . Shortness of breath dyspnea    Pulmonary Effusion  . Stroke [facility])    TIA, cva  . Umbilical hernia   . Urinary frequency     Past Surgical History:  Procedure Laterality Date  . APPENDECTOMY  1953  . CARDIAC CATHETERIZATION  01/06/2003   Recommend medical therapy  .  CARDIOVASCULAR STRESS TEST  07/16/2012   Mild-moderate perfusion defect seen in Basal inferior, Mid inferior, and Apica lateral consistent with infarct/scar. No scintigraphic evidence for inducible myocardial ischemia. No ECG changes. EKG negative for ischemia.  Marland Kitchen CAROTID DOPPLER  11/04/2012   Proximal Rt ICA 50-99% diameter reduction; Lft Bulb demonstrated mild amount homogeneous plaque-not hemodynamically significant; Lft ICA-normal patency.  . CHEST TUBE INSERTION Right 03/15/2016   Procedure: INSERTION PLEURAL  DRAINAGE CATHETER;  Surgeon: Ivin Poot, MD;  Location: [facility];  Service: Thoracic;  Laterality: Right;  . COLECTOMY     for lymphoma  . COLONOSCOPY    . neck fusion    . PACEMAKER GENERATOR CHANGE  01/31/2012   St Jude Med Accent DR RF model [id number] serial [id number]  . PACEMAKER PLACEMENT     replaced 3 x  . PERMANENT PACEMAKER GENERATOR CHANGE N/A 01/31/2012   Procedure: PERMANENT PACEMAKER GENERATOR CHANGE;  Surgeon: Sanda Klein, MD;  Location: [place] CATH LAB;   . PLEURADESIS N/A 04/02/2016   Procedure: PLEURADESIS;  Surgeon: Ivin Poot, MD;  Location: [place];  Service: Thoracic;  Laterality: N/A;  . REMOVAL OF PLEURAL DRAINAGE CATHETER Right 07/26/2016   Procedure: REMOVAL OF PLEURAL DRAINAGE CATHETER;  Surgeon: Ivin Poot, MD;  Location: [place];  Service: Thoracic;  Laterality: Right;  . TONSILLECTOMY    . TRANSTHORACIC ECHOCARDIOGRAM  11/04/2012   EF 63-01%, systolic function moderately reduced, mild regurg of the aortic and mitral valves.  Marland Kitchen VIDEO ASSISTED THORACOSCOPY Right 04/02/2016   Procedure: Right VIDEO ASSISTED THORACOSCOPY with Biopsies and drainage pleural effusion;  Surgeon: Ivin Poot, MD;  Location: [facility] OR;  Service: Thoracic;  Laterality: Right;    Current Medications: Outpatient Medications Prior to Visit  Medication Sig Dispense Refill  . acetaminophen (TYLENOL) 500 MG tablet Take 1,000 mg by mouth every 6 (six) hours as needed for moderate pain.     . furosemide (LASIX) 20 MG tablet TAKE 1 TO 2 TABLETS BY MOUTH DAILY AS DIRECTED 180 tablet 1  . metoprolol succinate (TOPROL-XL) 25 MG 24 hr tablet TAKE 1 TABLET BY MOUTH TWICE A DAY 180 tablet 3  . pravastatin (PRAVACHOL) 80 MG tablet Take 1 tablet (80 mg total) by mouth daily. 90 tablet 1  . tamsulosin (FLOMAX) 0.4 MG CAPS capsule Take 0.4 mg by mouth at bedtime.    . traZODone (DESYREL) 50 MG tablet TAKE 1/2 TO 1 TABLET BY MOUTH AT BEDTIME AS NEEDED FOR SLEEP 90 tablet 3  . triamcinolone cream (KENALOG)  0.1 % APPLY ON THE SKIN TWICE A DAY AS NEEDED    . warfarin (COUMADIN) 2.5 MG tablet TAKE 1 TO 1.5 TABLETS BY MOUTH DAILY AS DIRECTED BY THE COUMADIN CLINIC 135 tablet 1  . doxycycline (VIBRA-TABS) 100 MG tablet Take 1 tablet (100 mg total) by mouth 2 (two) times daily. 14 tablet 0  . mupirocin ointment (BACTROBAN) 2 % Apply 1 application topically 2 (two) times daily. 22 g 0  . REPATHA SURECLICK 601 MG/ML SOAJ INJECT 140 MG INTO THE SKIN EVERY 14 (FOURTEEN) DAYS. 2 mL 11   No facility-administered medications prior to visit.     Allergies:   Statins, Adhesive [tape], Latex, and Penicillins   Social History   Socioeconomic History  . Marital status: Married    Spouse name: Chartered certified accountant  . Number of children: 3  . Years of education: 65  . Highest education level: Not on file  Occupational History  . Occupation: Retired  Tobacco Use  . Smoking status: Never Smoker  . Smokeless tobacco: Never Used  Vaping Use  . Vaping Use: Never used  Substance and Sexual Activity  . Alcohol use: No    Alcohol/week: 0.0 standard drinks  . Drug use: No  . Sexual activity: Yes  Other Topics Concern  . Not on file  Social History Narrative   Fun: Golf when he can   Consumes caffeine 2 cups per day     Social Determinants of Health   Financial Resource Strain: Low Risk   . Difficulty of Paying Living Expenses: Not hard at all  Food Insecurity: No Food Insecurity  . Worried About Charity fundraiser in the Last Year: Never true  . Ran Out of Food in the Last Year: Never true  Transportation Needs: No Transportation Needs  . Lack of Transportation (Medical): No  . Lack of Transportation (Non-Medical): No  Physical Activity: Sufficiently Active  . Days of Exercise per Week: 5 days  . Minutes of Exercise per Session: 30 min  Stress: No Stress Concern Present  . Feeling of Stress : Not at all  Social Connections: Socially Integrated  . Frequency of Communication with Friends and Family:  More than three times a week  . Frequency of Social Gatherings with Friends and Family: More than three times a week  . Attends Religious Services: More than 4 times per year  . Active Member of Clubs or Organizations: Yes  . Attends Archivist Meetings: More than 4 times per year  . Marital Status: Married     Family History:  The patient's family history includes Diabetes in his maternal grandmother; Heart disease in his mother; Prostate cancer in his father; Stroke in his mother.   ROS:   Please see the history of present illness.    ROS all other systems are reviewed and are negative  PHYSICAL EXAM:   VS:  BP (!) 146/78   Pulse 65   Ht 5\' 9"  (1.753 m)   Wt 211 lb 6.4 oz (95.9 kg)   SpO2 96%   BMI 31.22 kg/m      General: Alert, oriented x3, no distress, healthy left pacemaker site Head: no evidence of trauma, PERRL, EOMI, no exophtalmos or lid lag, no myxedema, no xanthelasma; normal ears, nose and oropharynx Neck: normal jugular venous pulsations and no hepatojugular reflux; brisk carotid pulses without delay and no carotid bruits Chest: clear to auscultation, no signs of consolidation by percussion or palpation, normal fremitus, symmetrical and full respiratory excursions Cardiovascular: normal position and quality of the apical impulse, regular rhythm, normal first and paradoxically split second heart sounds, no murmurs, rubs or gallops Abdomen: no tenderness or distention, no masses by palpation, no abnormal pulsatility or arterial bruits, normal bowel sounds, no hepatosplenomegaly Extremities: no clubbing, cyanosis or edema; 2+ radial, ulnar and brachial pulses bilaterally; 2+ right femoral, posterior tibial and dorsalis pedis pulses; 2+ left femoral, posterior tibial and dorsalis pedis pulses; no subclavian or femoral bruits Neurological: grossly nonfocal Psych: Normal mood and affect    Wt Readings from Last 3 Encounters:  12/25/20 211 lb 6.4 oz (95.9 kg)   10/20/20 210 lb 3.2 oz (95.3 kg)  07/28/20 208 lb (94.3 kg)      Studies/Labs Reviewed:   EKG:  EKG is ordered today.  It shows atrial fibrillation with mostly ventricular paced beats, occasional ventricular sensed beats, very broad QRS.   Recent Labs: 07/28/2020: BUN 16; Creatinine, Ser 1.21; Hemoglobin  17.4; Platelets 214.0; Potassium 4.8; Sodium 137 08/16/2020: ALT 12   Lipid Panel    Component Value Date/Time   CHOL 152 07/28/2020 1156   CHOL 102 02/23/2020 0909   TRIG 75.0 07/28/2020 1156   HDL 42.70 07/28/2020 1156   HDL 43 02/23/2020 0909   CHOLHDL 4 07/28/2020 1156   VLDL 15.0 07/28/2020 1156   LDLCALC 95 07/28/2020 1156   LDLCALC 46 02/23/2020 0909     ASSESSMENT:    1. Chronic combined systolic (congestive) and diastolic (congestive) heart failure ([place])   2. Chronic atrial fibrillation (HCC)   3. CHB (complete heart block) (HCC)   4. Coronary artery disease involving native coronary artery of native heart without angina pectoris   5. Essential hypertension   6. Single chamber St. Jude pacemaker 2013   7. Ventricular tachycardia ([place])   8. Long term (current) use of anticoagulants   9. Renal artery stenosis ([place])   10. Pleural effusion, right   11. Pure hypercholesterolemia      PLAN:  In order of problems listed above:  1. CHF: He appears clinically euvolemic and has NYHA functional class I, taking a very low dose of diuretic, maintaining his dry weight around 2102 pounds.  LVEF has not been reassessed since 2017 when it was around 50%. 2. CHB: He is not pacemaker dependent and requires roughly 50% ventricular pacing.  It is unclear whether the background rhythm is junctional bradycardia or a remarkably dependent idioventricular rhythm with IVCD (QRS is very broad).  She has occasional episodes of accelerated rhythm with a cycle length that is almost always 340 ms and its unclear whether these represent a slow ventricular tachycardia or atrial flutter with  2: 1 AV conduction.  His episodes are consistently asymptomatic.  On one occasion he had a near syncopal event and similar arrhythmia, but retrospectively one wonders whether that was a vasovagal event/hypotension (occurred outside in the heat at a golf tournament).  Upgrade to CRT device has been discussed but felt to be high-risk due to his multiple previous surgical procedures and increased risk of infection 3. AFib: CHADSVasc 4 (age, CHF, CAD, HTN).  On appropriate anticoagulation.  No embolic events.  The current dose of beta-blocker seems to be maintaining a reasonable balance between excessive ventricular pacing which could cause worsening heart failure versus control of the episodes of tachyarrhythmia. 4. CAD: He is asymptomatic despite being fairly active.  He has evidence of extensive calcification in all 3 coronary arteries, but has never undergone coronary angiography.  Nuclear stress testing shows evidence of an old inferior scar but no reversible ischemia.  He has never complained of angina pectoris.  He prefers to avoid invasive evaluation. 5. HTN: Not well controlled.  Add spironolactone which will also help prevent potential hypokalemia from his loop diuretic.  Recheck blood pressure and labs when he comes back for his next prothrombin time check in the Coumadin clinic. 6. PPM: Normal device function.  Remote downloads every 3 months.  Office visits yearly. 7. VT: Very difficult to say for sure whether his episodes of tachycardia represent atrial flutter with 2: 1 AV block or true ventricular tachycardia (maybe slow VT related to inferior left ventricular scar?).  No change in electrogram morphology on the current device download as he transitions from regular tachycardia to atrial fibrillation suggest that this may actually be ventricular tachycardia.  Past treatments for these arrhythmia events with amiodarone has led to serious complications including thyrotoxicosis, heart failure  decompensation, recurrent  pleural effusion requiring pleurodesis.  No convincing evidence of ventricular tachycardia on current device check.  His episodes of high ventricular rate seems to be supraventricular in origin, probably atrial flutter with 2: 1 AV block.  He had serious complications from amiodarone in the past including thyrotoxicosis and heart failure decompensation, with recurrent pleural effusion requiring pleurodesis.   Continue beta-blocker therapy alone for now, unless he develops symptoms. 8. Anticoagulation: No bleeding complications, compliant with INR monitoring.  INR was 3.3 last month and 2.0 this month. 9. RAS: His blood pressure is slightly high and we will add spironolactone today.  Stable renal function with GFR around 60, will recheck in a month. 10. Carotid stenosis: Last evaluated in 2017, he has declined further ultrasound monitoring. 11. R pleural effusion: no recurrence, s/p pleurodesis, 2017. 12. HLP: Does not tolerate more potent statins.  His LDL is substantially improved from his baseline of 150, on the pravastatin, but it is not at target less than 70.  He prefers not to use really PCSK9 inhibitors due to cost.    Medication Adjustments/Labs and Tests Ordered: Current medicines are reviewed at length with the patient today.  Concerns regarding medicines are outlined above.  Medication changes, Labs and Tests ordered today are listed in the Patient Instructions below. Patient Instructions  Medication Instructions:  START Spironolactone 25 mg once daily  *If you need a refill on your cardiac medications before your next appointment, please call your pharmacy*   Lab Work: BMET at you next coumadin appointment  If you have labs (blood work) drawn today and your tests are completely normal, you will receive your results only by: Marland Kitchen MyChart Message (if you have MyChart) OR . A paper copy in the mail If you have any lab test that is abnormal or we need to change  your treatment, we will call you to review the results.   Testing/Procedures: None ordered   Follow-Up: At [facility], you and your health needs are our priority.  As part of our continuing mission to provide you with exceptional heart care, we have created designated Provider Care Teams.  These Care Teams include your primary Cardiologist (physician) and Advanced Practice Providers (APPs -  Physician Assistants and Nurse Practitioners) who all work together to provide you with the care you need, when you need it.  We recommend signing up for the patient portal called "MyChart".  Sign up information is provided on this After Visit Summary.  MyChart is used to connect with patients for Virtual Visits (Telemedicine).  Patients are able to view lab/test results, encounter notes, upcoming appointments, etc.  Non-urgent messages can be sent to your provider as well.   To learn more about what you can do with MyChart, go to [email or web address].    Your next appointment:   12 month(s)  The format for your next appointment:   In Person  Provider:   Sanda Klein, MD      Signed, Sanda Klein, MD  12/27/2020 9:16 AM    [address], [place], [place]  [address] Phone: [phone]; Fax: [phone]

## 2021-01-01 ENCOUNTER — Telehealth: Payer: Self-pay | Admitting: Cardiovascular Disease

## 2021-01-01 MED ORDER — FUROSEMIDE 20 MG PO TABS: ORAL_TABLET | ORAL | 3 refills | Status: DC

## 2021-01-01 NOTE — Telephone Encounter (Signed)
*  STAT* If patient is at the pharmacy, call can be transferred to refill team.   1. Which medications need to be refilled? (please list name of each medication and dose if known) Furosemide  2. Which pharmacy/location (including street and city if local pharmacy) is medication to be sent to? [facility] [address], [place]  3. Do they need a 30 day or 90 day supply? 90 days  And refills

## 2021-02-05 ENCOUNTER — Other Ambulatory Visit: Payer: Self-pay

## 2021-02-05 ENCOUNTER — Ambulatory Visit (INDEPENDENT_AMBULATORY_CARE_PROVIDER_SITE_OTHER): Payer: PPO

## 2021-02-05 DIAGNOSIS — Z7901 Long term (current) use of anticoagulants: Secondary | ICD-10-CM

## 2021-02-05 DIAGNOSIS — I482 Chronic atrial fibrillation, unspecified: Secondary | ICD-10-CM

## 2021-02-05 DIAGNOSIS — I5042 Chronic combined systolic (congestive) and diastolic (congestive) heart failure: Secondary | ICD-10-CM

## 2021-02-05 LAB — BASIC METABOLIC PANEL
BUN/Creatinine Ratio: 12 (ref 10–24)
BUN: 14 mg/dL (ref 8–27)
CO2: 23 mmol/L (ref 20–29)
Calcium: 8.8 mg/dL (ref 8.6–10.2)
Chloride: 100 mmol/L (ref 96–106)
Creatinine, Ser: 1.18 mg/dL (ref 0.76–1.27)
Glucose: 110 mg/dL — ABNORMAL HIGH (ref 65–99)
Potassium: 4.1 mmol/L (ref 3.5–5.2)
Sodium: 137 mmol/L (ref 134–144)
eGFR: 64 mL/min/{1.73_m2} (ref >59)

## 2021-02-05 LAB — POCT INR: INR: 2.9 (ref 2.0–3.0)

## 2021-02-05 NOTE — Patient Instructions (Signed)
Continue taking 1.5 tablets every day except 1 tablet Monday and Friday. Repeat INR in 6 weeks. 

## 2021-02-06 ENCOUNTER — Other Ambulatory Visit: Payer: Self-pay | Admitting: Cardiovascular Disease

## 2021-02-06 ENCOUNTER — Encounter: Payer: Self-pay | Admitting: *Deleted

## 2021-02-15 ENCOUNTER — Other Ambulatory Visit: Payer: Self-pay | Admitting: Cardiovascular Disease

## 2021-02-28 ENCOUNTER — Ambulatory Visit (INDEPENDENT_AMBULATORY_CARE_PROVIDER_SITE_OTHER): Payer: PPO

## 2021-02-28 DIAGNOSIS — I482 Chronic atrial fibrillation, unspecified: Secondary | ICD-10-CM

## 2021-03-01 LAB — CUP PACEART REMOTE DEVICE CHECK
Battery Remaining Longevity: 59 mo
Battery Remaining Percentage: 51 %
Battery Voltage: 2.87 V
Brady Statistic RV Percent Paced: 75 %
Date Time Interrogation Session: 20220518034301
Implantable Lead Implant Date: 20130419
Implantable Lead Location: 753860
Implantable Pulse Generator Implant Date: 20130419
Lead Channel Impedance Value: 410 Ohm
Lead Channel Pacing Threshold Amplitude: 1 V
Lead Channel Pacing Threshold Pulse Width: 0.5 ms
Lead Channel Sensing Intrinsic Amplitude: 12 mV
Lead Channel Setting Pacing Amplitude: 2.5 V
Lead Channel Setting Pacing Pulse Width: 0.5 ms
Lead Channel Setting Sensing Sensitivity: 2.5 mV
Pulse Gen Model: 1210
Pulse Gen Serial Number: 7313697

## 2021-03-19 ENCOUNTER — Other Ambulatory Visit: Payer: Self-pay

## 2021-03-19 ENCOUNTER — Ambulatory Visit (INDEPENDENT_AMBULATORY_CARE_PROVIDER_SITE_OTHER): Payer: PPO

## 2021-03-19 DIAGNOSIS — Z7901 Long term (current) use of anticoagulants: Secondary | ICD-10-CM

## 2021-03-19 DIAGNOSIS — I482 Chronic atrial fibrillation, unspecified: Secondary | ICD-10-CM | POA: Diagnosis not present

## 2021-03-19 LAB — POCT INR: INR: 2.4 (ref 2.0–3.0)

## 2021-03-19 NOTE — Patient Instructions (Signed)
Continue taking 1.5 tablets every day except 1 tablet Monday and Friday. Repeat INR in 7 weeks.

## 2021-03-22 NOTE — Progress Notes (Signed)
Remote pacemaker transmission.   

## 2021-03-28 ENCOUNTER — Other Ambulatory Visit: Payer: Self-pay | Admitting: Cardiovascular Disease

## 2021-04-18 ENCOUNTER — Other Ambulatory Visit: Payer: Self-pay | Admitting: Cardiovascular Disease

## 2021-04-18 DIAGNOSIS — I5023 Acute on chronic systolic (congestive) heart failure: Secondary | ICD-10-CM

## 2021-05-07 ENCOUNTER — Ambulatory Visit (INDEPENDENT_AMBULATORY_CARE_PROVIDER_SITE_OTHER): Payer: PPO

## 2021-05-07 ENCOUNTER — Other Ambulatory Visit: Payer: Self-pay

## 2021-05-07 DIAGNOSIS — Z7901 Long term (current) use of anticoagulants: Secondary | ICD-10-CM

## 2021-05-07 DIAGNOSIS — I482 Chronic atrial fibrillation, unspecified: Secondary | ICD-10-CM

## 2021-05-07 LAB — POCT INR: INR: 1.9 — AB (ref 2.0–3.0)

## 2021-05-07 NOTE — Patient Instructions (Signed)
Take 2 tablets tonight only and then Continue taking 1.5 tablets every day except 1 tablet Monday and Friday. Repeat INR in 7 weeks.

## 2021-05-30 ENCOUNTER — Ambulatory Visit (INDEPENDENT_AMBULATORY_CARE_PROVIDER_SITE_OTHER): Payer: PPO

## 2021-05-30 DIAGNOSIS — I442 Atrioventricular block, complete: Secondary | ICD-10-CM

## 2021-06-01 LAB — CUP PACEART REMOTE DEVICE CHECK
Battery Remaining Longevity: 16 mo
Battery Remaining Percentage: 13 %
Battery Voltage: 2.84 V
Brady Statistic RV Percent Paced: 65 %
Date Time Interrogation Session: 20220818162417
Implantable Lead Implant Date: 20130419
Implantable Lead Location: 753860
Implantable Pulse Generator Implant Date: 20130419
Lead Channel Impedance Value: 410 Ohm
Lead Channel Pacing Threshold Amplitude: 1 V
Lead Channel Pacing Threshold Pulse Width: 0.5 ms
Lead Channel Sensing Intrinsic Amplitude: 12 mV
Lead Channel Setting Pacing Amplitude: 2.5 V
Lead Channel Setting Pacing Pulse Width: 0.5 ms
Lead Channel Setting Sensing Sensitivity: 2.5 mV
Pulse Gen Model: 1210
Pulse Gen Serial Number: 7313697

## 2021-06-19 NOTE — Progress Notes (Signed)
Remote pacemaker transmission.   

## 2021-06-25 ENCOUNTER — Other Ambulatory Visit: Payer: Self-pay

## 2021-06-25 ENCOUNTER — Ambulatory Visit (INDEPENDENT_AMBULATORY_CARE_PROVIDER_SITE_OTHER): Payer: PPO

## 2021-06-25 DIAGNOSIS — I482 Chronic atrial fibrillation, unspecified: Secondary | ICD-10-CM

## 2021-06-25 DIAGNOSIS — Z7901 Long term (current) use of anticoagulants: Secondary | ICD-10-CM | POA: Diagnosis not present

## 2021-06-25 LAB — POCT INR: INR: 3.4 — AB (ref 2.0–3.0)

## 2021-06-25 NOTE — Patient Instructions (Signed)
Hold tonight only and then Continue taking 1.5 tablets every day except 1 tablet Monday and Friday. Repeat INR in 4 weeks.

## 2021-06-27 ENCOUNTER — Other Ambulatory Visit: Payer: Self-pay

## 2021-06-27 ENCOUNTER — Encounter: Payer: Self-pay | Admitting: Family Medicine

## 2021-06-27 ENCOUNTER — Telehealth (INDEPENDENT_AMBULATORY_CARE_PROVIDER_SITE_OTHER): Payer: PPO | Admitting: Family Medicine

## 2021-06-27 VITALS — Temp 97.4°F

## 2021-06-27 DIAGNOSIS — U071 COVID-19: Secondary | ICD-10-CM

## 2021-06-27 MED ORDER — PROMETHAZINE-DM 6.25-15 MG/5ML PO SYRP
5.0000 mL | ORAL_SOLUTION | Freq: Four times a day (QID) | ORAL | 0 refills | Status: DC | PRN
Start: 2021-06-27 — End: 2021-08-03

## 2021-06-27 MED ORDER — MOLNUPIRAVIR EUA 200MG CAPSULE: 4.0000 | ORAL_CAPSULE | Freq: Two times a day (BID) | ORAL | 0 refills | Status: AC

## 2021-06-27 NOTE — Progress Notes (Signed)
Chief Complaint  Patient presents with   Cough   Diarrhea    Tested positive for covid 06/27/2021    Benjamin Mckay [facility] here for URI complaints. Due to COVID-19 pandemic, we are interacting via telephone. I verified patient's ID using 2 identifiers. Patient agreed to proceed with visit via this method. Patient is at home, I am at office. Patient and I are present for visit.   Duration: 4 days  Associated symptoms:  diarrhea, coughing Denies: sinus congestion, sinus pain, rhinorrhea, itchy watery eyes, ear pain, ear drainage, sore throat, wheezing, shortness of breath, myalgia, and N/V, loss of taste/smell Treatment to date: Delsym, Nyquil, Dayquil, Tylenol Sick contacts: Yes; grandson Tested + today 06/27/21 Recevied initial [employer] series and 1 booster.   Past Medical History:  Diagnosis Date   Atrial fibrillation ([place])    Cardiomyopathy, ischemic 11/07/2013   Cataract    bil cataracts removed   CHB (complete heart block) ([place]) 11/06/2013   Chronic combined systolic and diastolic CHF (congestive heart failure) (H[facility])    Clotting disorder (HCC)    TIA , cva - PT ON COUMADINE   Colon polyps    Colon polyps    Congenital heart block    Constipation    CVA (cerebral infarction)    Diverticulosis    Esophageal stricture    Family history of adverse reaction to anesthesia    Heart murmur    Hyperlipidemia    Hypertension    Hyperthyroidism 11/16/2015   Lymphoma ([place]) 1998   of colon    Presence of permanent cardiac pacemaker    St. Jude  pt.states he is totally dependent on pacemaker   Shortness of breath dyspnea    Pulmonary Effusion   Stroke (HCC)    TIA, cva   Umbilical hernia    Urinary frequency     Objective Temp (!) 97.4 F (36.3 C) (Oral)  No conversational dyspnea Age appropriate judgment and insight Nml affect and mood  COVID-19 - Plan: molnupiravir EUA (LAGEVRIO) 200 mg CAPS capsule, promethazine-dextromethorphan (PROMETHAZINE-DM) 6.25-15 MG/5ML syrup  Given age  co-morbidities will start antiviral med for 5 d. Cough syrup has worked well for him in past, will rx prn. Warned about drowsiness.  Continue to push fluids, practice good hand hygiene, cover mouth when coughing. F/u prn. If starting to experience irreplaceable fluid loss, shaking, or shortness of breath, seek immediate care. Total time: 11 min Pt voiced understanding and agreement to the plan.  [name], DO 06/27/21 2:29 PM

## 2021-07-10 ENCOUNTER — Other Ambulatory Visit: Payer: Self-pay | Admitting: Family

## 2021-07-23 ENCOUNTER — Ambulatory Visit (INDEPENDENT_AMBULATORY_CARE_PROVIDER_SITE_OTHER): Payer: PPO

## 2021-07-23 ENCOUNTER — Other Ambulatory Visit: Payer: Self-pay

## 2021-07-23 DIAGNOSIS — I482 Chronic atrial fibrillation, unspecified: Secondary | ICD-10-CM | POA: Diagnosis not present

## 2021-07-23 DIAGNOSIS — Z7901 Long term (current) use of anticoagulants: Secondary | ICD-10-CM | POA: Diagnosis not present

## 2021-07-23 LAB — POCT INR: INR: 2 (ref 2.0–3.0)

## 2021-07-23 NOTE — Patient Instructions (Signed)
Continue taking 1.5 tablets every day except 1 tablet Monday and Friday. Repeat INR in 6 weeks.

## 2021-08-01 DIAGNOSIS — Z08 Encounter for follow-up examination after completed treatment for malignant neoplasm: Secondary | ICD-10-CM | POA: Diagnosis not present

## 2021-08-01 DIAGNOSIS — L570 Actinic keratosis: Secondary | ICD-10-CM | POA: Diagnosis not present

## 2021-08-01 DIAGNOSIS — L814 Other melanin hyperpigmentation: Secondary | ICD-10-CM | POA: Diagnosis not present

## 2021-08-01 DIAGNOSIS — L819 Disorder of pigmentation, unspecified: Secondary | ICD-10-CM | POA: Diagnosis not present

## 2021-08-01 DIAGNOSIS — L853 Xerosis cutis: Secondary | ICD-10-CM | POA: Diagnosis not present

## 2021-08-01 DIAGNOSIS — L821 Other seborrheic keratosis: Secondary | ICD-10-CM | POA: Diagnosis not present

## 2021-08-01 DIAGNOSIS — D225 Melanocytic nevi of trunk: Secondary | ICD-10-CM | POA: Diagnosis not present

## 2021-08-01 DIAGNOSIS — Z85828 Personal history of other malignant neoplasm of skin: Secondary | ICD-10-CM | POA: Diagnosis not present

## 2021-08-02 ENCOUNTER — Other Ambulatory Visit: Payer: Self-pay | Admitting: Cardiovascular Disease

## 2021-08-02 ENCOUNTER — Other Ambulatory Visit: Payer: Self-pay

## 2021-08-03 ENCOUNTER — Ambulatory Visit (INDEPENDENT_AMBULATORY_CARE_PROVIDER_SITE_OTHER): Payer: PPO | Admitting: Family

## 2021-08-03 ENCOUNTER — Encounter: Payer: Self-pay | Admitting: Family

## 2021-08-03 VITALS — BP 120/70 | HR 65 | Temp 97.4°F | Ht 69.0 in | Wt 208.6 lb

## 2021-08-03 DIAGNOSIS — Z23 Encounter for immunization: Secondary | ICD-10-CM

## 2021-08-03 DIAGNOSIS — Z13220 Encounter for screening for lipoid disorders: Secondary | ICD-10-CM | POA: Diagnosis not present

## 2021-08-03 DIAGNOSIS — Z0000 Encounter for general adult medical examination without abnormal findings: Secondary | ICD-10-CM | POA: Diagnosis not present

## 2021-08-03 DIAGNOSIS — Z125 Encounter for screening for malignant neoplasm of prostate: Secondary | ICD-10-CM

## 2021-08-03 LAB — COMPREHENSIVE METABOLIC PANEL
ALT: 11 U/L (ref 0–53)
AST: 17 U/L (ref 0–37)
Albumin: 4.1 g/dL (ref 3.5–5.2)
Alkaline Phosphatase: 79 U/L (ref 39–117)
BUN: 16 mg/dL (ref 6–23)
CO2: 30 mEq/L (ref 19–32)
Calcium: 9 mg/dL (ref 8.4–10.5)
Chloride: 102 mEq/L (ref 96–112)
Creatinine, Ser: 1.03 mg/dL (ref 0.40–1.50)
GFR: 71.01 mL/min (ref >60.00)
Glucose, Bld: 100 mg/dL — ABNORMAL HIGH (ref 70–99)
Potassium: 4.8 mEq/L (ref 3.5–5.1)
Sodium: 138 mEq/L (ref 135–145)
Total Bilirubin: 1.4 mg/dL — ABNORMAL HIGH (ref 0.2–1.2)
Total Protein: 7.1 g/dL (ref 6.0–8.3)

## 2021-08-03 LAB — CBC WITH DIFFERENTIAL/PLATELET
Basophils Absolute: 0 10*3/uL (ref 0.0–0.1)
Basophils Relative: 0.8 % (ref 0.0–3.0)
Eosinophils Absolute: 0.2 10*3/uL (ref 0.0–0.7)
Eosinophils Relative: 2.9 % (ref 0.0–5.0)
HCT: 46.2 % (ref 39.0–52.0)
Hemoglobin: 15.6 g/dL (ref 13.0–17.0)
Lymphocytes Relative: 26.8 % (ref 12.0–46.0)
Lymphs Abs: 1.5 10*3/uL (ref 0.7–4.0)
MCHC: 33.6 g/dL (ref 30.0–36.0)
MCV: 99.7 fl (ref 78.0–100.0)
Monocytes Absolute: 0.4 10*3/uL (ref 0.1–1.0)
Monocytes Relative: 7.8 % (ref 3.0–12.0)
Neutro Abs: 3.4 10*3/uL (ref 1.4–7.7)
Neutrophils Relative %: 61.7 % (ref 43.0–77.0)
Platelets: 179 10*3/uL (ref 150.0–400.0)
RBC: 4.64 Mil/uL (ref 4.22–5.81)
RDW: 13.4 % (ref 11.5–15.5)
WBC: 5.5 10*3/uL (ref 4.0–10.5)

## 2021-08-03 LAB — LIPID PANEL
Cholesterol: 229 mg/dL — ABNORMAL HIGH (ref 0–200)
HDL: 37.8 mg/dL — ABNORMAL LOW (ref >39.00)
LDL Cholesterol: 175 mg/dL — ABNORMAL HIGH (ref 0–99)
NonHDL: 191.57
Total CHOL/HDL Ratio: 6
Triglycerides: 82 mg/dL (ref 0.0–149.0)
VLDL: 16.4 mg/dL (ref 0.0–40.0)

## 2021-08-03 LAB — TSH: TSH: 1.33 u[IU]/mL (ref 0.35–5.50)

## 2021-08-03 LAB — PSA: PSA: 1.05 ng/mL (ref 0.10–4.00)

## 2021-08-03 NOTE — Progress Notes (Signed)
Benjamin Mckay is a 75 y.o. male with the following history as recorded in EpicCare:  Patient Active Problem List   Diagnosis Date Noted   Medicare annual wellness visit, subsequent 07/21/2017   Routine adult health maintenance 07/21/2017   Pleural effusion, malignant 10/02/2016   Rotator cuff tendinitis 09/10/2016   Ventricular tachycardia 06/12/2016   Amiodarone-induced hyperthyroidism 03/09/2016   Insomnia 03/08/2016   Solitary pulmonary nodule 01/09/2016   Hypercholesterolemia 12/12/2015   History of sustained ventricular tachycardia 01/30/2015   Chronic combined systolic (congestive) and diastolic (congestive) heart failure ([place]) 12/18/2014   Warfarin anticoagulation 12/08/2014   ARF (acute renal failure) ([place]) 12/07/2014   Cardiomyopathy, ischemic 11/07/2013   CHB (complete heart block) ([place]) 11/06/2013   Single chamber St. Jude pacemaker 2013 11/06/2013   Chronic atrial fibrillation ([place]) 01/29/2013   Long term (current) use of anticoagulants 01/29/2013   HTN (hypertension) 02/01/2012   Carotid stenosis 02/01/2012   Renal artery stenosis ([place]) 02/01/2012   Coronary artery disease involving native coronary artery of native heart with angina pectoris ([place]) 02/01/2012   Esophageal reflux 08/21/2011   Hemorrhage of rectum and anus 08/67/6195   HERNIA, UMBILICAL 09/32/6712   UNSPECIFIED CARDIAC DYSRHYTHMIA 11/11/2007   Transient cerebral ischemia 11/11/2007   ESOPHAGEAL STRICTURE 11/11/2007   DIVERTICULOSIS OF COLON 11/11/2007   Non-Hodgkin's lymphoma of intestine ([place]) 09/03/2007    Current Outpatient Medications  Medication Sig Dispense Refill   acetaminophen (TYLENOL) 500 MG tablet Take 1,000 mg by mouth every 6 (six) hours as needed for moderate pain.      furosemide (LASIX) 20 MG tablet TAKE 1 TO 2 TABLETS BY MOUTH DAILY AS DIRECTED 180 tablet 3   metoprolol succinate (TOPROL-XL) 25 MG 24 hr tablet TAKE 1 TABLET BY MOUTH TWICE A DAY 180 tablet 3   pravastatin  (PRAVACHOL) 80 MG tablet TAKE 1 TABLET BY MOUTH EVERY DAY 90 tablet 1   spironolactone (ALDACTONE) 25 MG tablet Take 1 tablet (25 mg total) by mouth daily. 90 tablet 3   tamsulosin (FLOMAX) 0.4 MG CAPS capsule Take 0.4 mg by mouth at bedtime.     traZODone (DESYREL) 50 MG tablet TAKE 1/2 TO 1 TABLET BY MOUTH AT BEDTIME AS NEEDED FOR SLEEP 90 tablet 1   triamcinolone cream (KENALOG) 0.1 % APPLY ON THE SKIN TWICE A DAY AS NEEDED     warfarin (COUMADIN) 2.5 MG tablet TAKE 1 TO 1.5 TABLETS BY MOUTH DAILY AS DIRECTED BY THE COUMADIN CLINIC 135 tablet 1   No current facility-administered medications for this visit.    Allergies: Statins, Adhesive [tape], Latex, and Penicillins  Past Medical History:  Diagnosis Date   Atrial fibrillation ([place])    Cardiomyopathy, ischemic 11/07/2013   Cataract    bil cataracts removed   CHB (complete heart block) ([place]) 11/06/2013   Chronic combined systolic and diastolic CHF (congestive heart failure) ([facility])    Clotting disorder ([facility])    TIA , cva - PT ON COUMADINE   Colon polyps    Colon polyps    Congenital heart block    Constipation    CVA (cerebral infarction)    Diverticulosis    Esophageal stricture    Family history of adverse reaction to anesthesia    Heart murmur    Hyperlipidemia    Hypertension    Hyperthyroidism 11/16/2015   Lymphoma ([place]) 1998   of colon    Presence of permanent cardiac pacemaker    St. Jude  pt.states he is totally dependent on  pacemaker   Shortness of breath dyspnea    Pulmonary Effusion   Stroke ([place])    TIA, cva   Umbilical hernia    Urinary frequency     Past Surgical History:  Procedure Laterality Date   APPENDECTOMY  1953   CARDIAC CATHETERIZATION  01/06/2003   Recommend medical therapy   CARDIOVASCULAR STRESS TEST  07/16/2012   Mild-moderate perfusion defect seen in Basal inferior, Mid inferior, and Apica lateral consistent with infarct/scar. No scintigraphic evidence for inducible myocardial ischemia. No ECG  changes. EKG negative for ischemia.   CAROTID DOPPLER  11/04/2012   Proximal Rt ICA 50-99% diameter reduction; Lft Bulb demonstrated mild amount homogeneous plaque-not hemodynamically significant; Lft ICA-normal patency.   CHEST TUBE INSERTION Right 03/15/2016   Procedure: INSERTION PLEURAL DRAINAGE CATHETER;  Surgeon: Ivin Poot, MD;  Location: [facility];  Service: Thoracic;  Laterality: Right;   COLECTOMY     for lymphoma   COLONOSCOPY     neck fusion     PACEMAKER GENERATOR CHANGE  01/31/2012   [employer] DR RF model [id number] serial [id number]   PACEMAKER PLACEMENT     replaced 3 x   PERMANENT PACEMAKER GENERATOR CHANGE N/A 01/31/2012   Procedure: PERMANENT PACEMAKER GENERATOR CHANGE;  Surgeon: Sanda Klein, MD;  Location: [facility] CATH LAB;    PLEURADESIS N/A 04/02/2016   Procedure: Antionette Poles;  Surgeon: Ivin Poot, MD;  Location: [facility];  Service: Thoracic;  Laterality: N/A;   REMOVAL OF PLEURAL DRAINAGE CATHETER Right 07/26/2016   Procedure: REMOVAL OF PLEURAL DRAINAGE CATHETER;  Surgeon: Ivin Poot, MD;  Location: [facility];  Service: Thoracic;  Laterality: Right;   TONSILLECTOMY     TRANSTHORACIC ECHOCARDIOGRAM  11/04/2012   EF 67-67%, systolic function moderately reduced, mild regurg of the aortic and mitral valves.   VIDEO ASSISTED THORACOSCOPY Right 04/02/2016   Procedure: Right VIDEO ASSISTED THORACOSCOPY with Biopsies and drainage pleural effusion;  Surgeon: Ivin Poot, MD;  Location: [facility];  Service: Thoracic;  Laterality: Right;    Family History  Problem Relation Age of Onset   Prostate cancer Father    Heart disease Mother    Stroke Mother    Diabetes Maternal Grandmother    Colon cancer Neg Hx    Heart attack Neg Hx    Esophageal cancer Neg Hx    Stomach cancer Neg Hx    Rectal cancer Neg Hx     Social History   Tobacco Use   Smoking status: Never   Smokeless tobacco: Never  Substance Use Topics   Alcohol use: No    Alcohol/week: 0.0 standard drinks     Subjective:  Presents for yearly CPE; no acute concerns today; would like flu and Pneumovax; Seeing eye doctor and dentist regularly; Continuing with dermatology, urology and cardiology;  Colonoscopy 2020- no repeat planned;   Review of Systems  Constitutional: Negative.   HENT: Negative.    Eyes: Negative.   Respiratory: Negative.    Cardiovascular: Negative.   Gastrointestinal: Negative.   Genitourinary: Negative.   Musculoskeletal: Negative.   Skin: Negative.   Neurological: Negative.   Endo/Heme/Allergies: Negative.   Psychiatric/Behavioral: Negative.          Objective:  Vitals:   08/03/21 0922  BP: 120/70  Pulse: 65  Temp: (!) 97.4 F (36.3 C)  TempSrc: Oral  SpO2: 96%  Weight: 208 lb 9.6 oz (94.6 kg)  Height: 5' 9"  (1.753 m)    General: Well developed, well  nourished, in no acute distress  Skin : Warm and dry.  Head: Normocephalic and atraumatic  Eyes: Sclera and conjunctiva clear; pupils round and reactive to light; extraocular movements intact  Ears: External normal; canals clear; tympanic membranes normal  Oropharynx: Pink, supple. No suspicious lesions  Neck: Supple without thyromegaly, adenopathy  Lungs: Respirations unlabored; clear to auscultation bilaterally without wheeze, rales, rhonchi  CVS exam: normal rate and regular rhythm.  Abdomen: Soft; nontender; nondistended; normoactive bowel sounds; no masses or hepatosplenomegaly  Musculoskeletal: No deformities; no active joint inflammation  Extremities: No edema, cyanosis, clubbing  Vessels: Symmetric bilaterally  Neurologic: Alert and oriented; speech intact; face symmetrical; moves all extremities well; CNII-XII intact without focal deficit  Assessment:  1. PE (physical exam), annual   2. Lipid screening   3. Prostate cancer screening   4. Need for immunization against influenza     Plan:  Age appropriate preventive healthcare needs addressed; encouraged regular eye doctor and dental  exams; encouraged regular exercise; will update labs and refills as needed today; follow-up to be determined;  This visit occurred during the SARS-CoV-2 public health emergency.  Safety protocols were in place, including screening questions prior to the visit, additional usage of staff PPE, and extensive cleaning of exam room while observing appropriate contact time as indicated for disinfecting solutions.    No follow-ups on file.  Orders Placed This Encounter  Procedures   Pneumococcal polysaccharide vaccine 23-valent greater than or equal to 2yo subcutaneous/IM   Flu Vaccine QUAD High Dose(Fluad)   CBC with Differential/Platelet   Comp Met (CMET)   Lipid panel   TSH   PSA    Requested Prescriptions    No prescriptions requested or ordered in this encounter

## 2021-08-07 ENCOUNTER — Other Ambulatory Visit: Payer: Self-pay | Admitting: Family

## 2021-08-07 DIAGNOSIS — E785 Hyperlipidemia, unspecified: Secondary | ICD-10-CM

## 2021-08-07 MED ORDER — EZETIMIBE 10 MG PO TABS: 10.0000 mg | ORAL_TABLET | Freq: Every day | ORAL | 0 refills | Status: DC

## 2021-08-08 ENCOUNTER — Ambulatory Visit: Payer: PPO

## 2021-08-29 ENCOUNTER — Ambulatory Visit (INDEPENDENT_AMBULATORY_CARE_PROVIDER_SITE_OTHER): Payer: PPO

## 2021-08-29 DIAGNOSIS — I442 Atrioventricular block, complete: Secondary | ICD-10-CM

## 2021-08-29 LAB — CUP PACEART REMOTE DEVICE CHECK
Battery Remaining Longevity: 13 mo
Battery Remaining Percentage: 11 %
Battery Voltage: 2.83 V
Brady Statistic RV Percent Paced: 61 %
Date Time Interrogation Session: 20221116020029
Implantable Lead Implant Date: 20130419
Implantable Lead Location: 753860
Implantable Pulse Generator Implant Date: 20130419
Lead Channel Impedance Value: 430 Ohm
Lead Channel Pacing Threshold Amplitude: 1 V
Lead Channel Pacing Threshold Pulse Width: 0.5 ms
Lead Channel Sensing Intrinsic Amplitude: 11.9 mV
Lead Channel Setting Pacing Amplitude: 2.5 V
Lead Channel Setting Pacing Pulse Width: 0.5 ms
Lead Channel Setting Sensing Sensitivity: 2.5 mV
Pulse Gen Model: 1210
Pulse Gen Serial Number: 7313697

## 2021-09-03 ENCOUNTER — Other Ambulatory Visit: Payer: Self-pay

## 2021-09-03 ENCOUNTER — Ambulatory Visit (INDEPENDENT_AMBULATORY_CARE_PROVIDER_SITE_OTHER): Payer: PPO

## 2021-09-03 ENCOUNTER — Telehealth: Payer: Self-pay

## 2021-09-03 DIAGNOSIS — I482 Chronic atrial fibrillation, unspecified: Secondary | ICD-10-CM | POA: Diagnosis not present

## 2021-09-03 DIAGNOSIS — Z7901 Long term (current) use of anticoagulants: Secondary | ICD-10-CM

## 2021-09-03 LAB — POCT INR: INR: 2.7 (ref 2.0–3.0)

## 2021-09-03 MED ORDER — REPATHA SURECLICK 140 MG/ML ~~LOC~~ SOAJ: 140.0000 mg | SUBCUTANEOUS | 11 refills | Status: DC

## 2021-09-03 NOTE — Telephone Encounter (Signed)
Returned a call to the pt and was able to get their healthwell grant approved, rx sent and pt voiced understanding

## 2021-09-03 NOTE — Patient Instructions (Signed)
Continue taking 1.5 tablets every day except 1 tablet Monday and Friday. Repeat INR in 6 weeks.

## 2021-09-05 NOTE — Progress Notes (Signed)
Remote pacemaker transmission.   

## 2021-09-27 ENCOUNTER — Other Ambulatory Visit: Payer: Self-pay | Admitting: Cardiovascular Disease

## 2021-10-17 ENCOUNTER — Ambulatory Visit (INDEPENDENT_AMBULATORY_CARE_PROVIDER_SITE_OTHER): Payer: PPO

## 2021-10-17 ENCOUNTER — Other Ambulatory Visit: Payer: Self-pay

## 2021-10-17 DIAGNOSIS — Z7901 Long term (current) use of anticoagulants: Secondary | ICD-10-CM | POA: Diagnosis not present

## 2021-10-17 DIAGNOSIS — I482 Chronic atrial fibrillation, unspecified: Secondary | ICD-10-CM

## 2021-10-17 LAB — POCT INR: INR: 2.1 (ref 2.0–3.0)

## 2021-10-17 NOTE — Patient Instructions (Signed)
Continue taking 1.5 tablets every day except 1 tablet Monday and Friday. Repeat INR in 6 weeks.

## 2021-10-18 ENCOUNTER — Other Ambulatory Visit (INDEPENDENT_AMBULATORY_CARE_PROVIDER_SITE_OTHER): Payer: PPO

## 2021-10-18 DIAGNOSIS — E785 Hyperlipidemia, unspecified: Secondary | ICD-10-CM

## 2021-10-18 LAB — LIPID PANEL
Cholesterol: 114 mg/dL (ref 0–200)
HDL: 44.5 mg/dL (ref >39.00)
LDL Cholesterol: 54 mg/dL (ref 0–99)
NonHDL: 69.14
Total CHOL/HDL Ratio: 3
Triglycerides: 75 mg/dL (ref 0.0–149.0)
VLDL: 15 mg/dL (ref 0.0–40.0)

## 2021-10-18 LAB — COMPREHENSIVE METABOLIC PANEL
ALT: 13 U/L (ref 0–53)
AST: 15 U/L (ref 0–37)
Albumin: 3.9 g/dL (ref 3.5–5.2)
Alkaline Phosphatase: 77 U/L (ref 39–117)
BUN: 13 mg/dL (ref 6–23)
CO2: 31 mEq/L (ref 19–32)
Calcium: 8.8 mg/dL (ref 8.4–10.5)
Chloride: 103 mEq/L (ref 96–112)
Creatinine, Ser: 1.12 mg/dL (ref 0.40–1.50)
GFR: 64.12 mL/min (ref >60.00)
Glucose, Bld: 103 mg/dL — ABNORMAL HIGH (ref 70–99)
Potassium: 4.1 mEq/L (ref 3.5–5.1)
Sodium: 139 mEq/L (ref 135–145)
Total Bilirubin: 1.1 mg/dL (ref 0.2–1.2)
Total Protein: 6.8 g/dL (ref 6.0–8.3)

## 2021-10-19 ENCOUNTER — Telehealth: Payer: Self-pay | Admitting: Family

## 2021-10-19 NOTE — Telephone Encounter (Signed)
Pt would like copy of labs mailed to him.

## 2021-10-19 NOTE — Telephone Encounter (Signed)
Yes I have mailed the letter to pt

## 2021-11-28 ENCOUNTER — Ambulatory Visit (INDEPENDENT_AMBULATORY_CARE_PROVIDER_SITE_OTHER): Payer: PPO

## 2021-11-28 ENCOUNTER — Other Ambulatory Visit: Payer: Self-pay

## 2021-11-28 DIAGNOSIS — I442 Atrioventricular block, complete: Secondary | ICD-10-CM | POA: Diagnosis not present

## 2021-11-28 DIAGNOSIS — Z7901 Long term (current) use of anticoagulants: Secondary | ICD-10-CM | POA: Diagnosis not present

## 2021-11-28 DIAGNOSIS — I482 Chronic atrial fibrillation, unspecified: Secondary | ICD-10-CM

## 2021-11-28 LAB — POCT INR: INR: 2 (ref 2.0–3.0)

## 2021-11-28 LAB — CUP PACEART REMOTE DEVICE CHECK
Battery Remaining Longevity: 14 mo
Battery Remaining Percentage: 11 %
Battery Voltage: 2.84 V
Brady Statistic RV Percent Paced: 57 %
Date Time Interrogation Session: 20230215042936
Implantable Lead Implant Date: 20130419
Implantable Lead Location: 753860
Implantable Pulse Generator Implant Date: 20130419
Lead Channel Impedance Value: 450 Ohm
Lead Channel Pacing Threshold Amplitude: 1 V
Lead Channel Pacing Threshold Pulse Width: 0.5 ms
Lead Channel Sensing Intrinsic Amplitude: 12 mV
Lead Channel Setting Pacing Amplitude: 2.5 V
Lead Channel Setting Pacing Pulse Width: 0.5 ms
Lead Channel Setting Sensing Sensitivity: 2.5 mV
Pulse Gen Model: 1210
Pulse Gen Serial Number: 7313697

## 2021-11-28 NOTE — Patient Instructions (Signed)
Continue taking 1.5 tablets every day except 1 tablet Monday and Friday. Repeat INR in 7 weeks.

## 2021-12-04 NOTE — Progress Notes (Signed)
Remote pacemaker transmission.   

## 2021-12-12 ENCOUNTER — Other Ambulatory Visit: Payer: Self-pay | Admitting: Family

## 2022-01-02 ENCOUNTER — Encounter: Payer: Self-pay | Admitting: Family Medicine

## 2022-01-02 ENCOUNTER — Ambulatory Visit (INDEPENDENT_AMBULATORY_CARE_PROVIDER_SITE_OTHER): Payer: PPO | Admitting: Family Medicine

## 2022-01-02 ENCOUNTER — Other Ambulatory Visit: Payer: Self-pay | Admitting: Family Medicine

## 2022-01-02 VITALS — BP 111/66 | HR 93 | Temp 97.5°F | Ht 69.0 in | Wt 205.0 lb

## 2022-01-02 DIAGNOSIS — J069 Acute upper respiratory infection, unspecified: Secondary | ICD-10-CM

## 2022-01-02 MED ORDER — FLUTICASONE PROPIONATE 50 MCG/ACT NA SUSP: 2.0000 | Freq: Every day | NASAL | 0 refills | Status: DC

## 2022-01-02 MED ORDER — GUAIFENESIN ER 600 MG PO TB12: 1200.0000 mg | ORAL_TABLET | Freq: Two times a day (BID) | ORAL | 2 refills | Status: DC

## 2022-01-02 MED ORDER — BENZONATATE 200 MG PO CAPS: 200.0000 mg | ORAL_CAPSULE | Freq: Two times a day (BID) | ORAL | 0 refills | Status: DC | PRN

## 2022-01-02 MED ORDER — CETIRIZINE HCL 10 MG PO TABS: 10.0000 mg | ORAL_TABLET | Freq: Every day | ORAL | 11 refills | Status: DC

## 2022-01-02 NOTE — Progress Notes (Signed)
? ?Acute Office Visit ? ?Subjective:  ? ? Patient ID: Benjamin Mckay, male    DOB: 08-25-1946, 76 y.o.   MRN: [id number] ? ?CC: cough x3-4 days ? ? ?Cough ?This is a new problem. The current episode started in the past 7 days. The problem has been unchanged. The cough is Productive of purulent sputum. Associated symptoms include nasal congestion. Pertinent negatives include no chest pain, chills, ear congestion, ear pain, fever, headaches, heartburn, hemoptysis, myalgias, postnasal drip, rash, rhinorrhea, sore throat, shortness of breath, sweats, weight loss or wheezing. Nothing aggravates the symptoms. Treatments tried: Delsym, Dayquil. The treatment provided mild relief. There is no history of asthma, COPD or pneumonia.  ?No known sick contacts. No history of seasonal allergies. No edema or weight gain - has lasix daily and an extra as needed - hasn't needed any extra lately. ? ? ? ? ? ?Past Medical History:  ?Diagnosis Date  ? Atrial fibrillation ([place])   ? Cardiomyopathy, ischemic 11/07/2013  ? Cataract   ? bil cataracts removed  ? CHB (complete heart block) ([facility]) 11/06/2013  ? Chronic combined systolic and diastolic CHF (congestive heart failure) ([place])   ? Clotting disorder ([place])   ? TIA , cva - PT ON COUMADINE  ? Colon polyps   ? Colon polyps   ? Congenital heart block   ? Constipation   ? CVA (cerebral infarction)   ? Diverticulosis   ? Esophageal stricture   ? Family history of adverse reaction to anesthesia   ? Heart murmur   ? Hyperlipidemia   ? Hypertension   ? Hyperthyroidism 11/16/2015  ? Lymphoma ([place]) 1998  ? of colon   ? Presence of permanent cardiac pacemaker   ? St. Jude  pt.states he is totally dependent on pacemaker  ? Shortness of breath dyspnea   ? Pulmonary Effusion  ? Stroke [facility])   ? TIA, cva  ? Umbilical hernia   ? Urinary frequency   ? ? ?Past Surgical History:  ?Procedure Laterality Date  ? APPENDECTOMY  1953  ? CARDIAC CATHETERIZATION  01/06/2003  ? Recommend medical therapy  ? CARDIOVASCULAR  STRESS TEST  07/16/2012  ? Mild-moderate perfusion defect seen in Basal inferior, Mid inferior, and Apica lateral consistent with infarct/scar. No scintigraphic evidence for inducible myocardial ischemia. No ECG changes. EKG negative for ischemia.  ? CAROTID DOPPLER  11/04/2012  ? Proximal Rt ICA 50-99% diameter reduction; Lft Bulb demonstrated mild amount homogeneous plaque-not hemodynamically significant; Lft ICA-normal patency.  ? CHEST TUBE INSERTION Right 03/15/2016  ? Procedure: INSERTION PLEURAL DRAINAGE CATHETER;  Surgeon: Ivin Poot, MD;  Location: [facility];  Service: Thoracic;  Laterality: Right;  ? COLECTOMY    ? for lymphoma  ? COLONOSCOPY    ? neck fusion    ? PACEMAKER GENERATOR CHANGE  01/31/2012  ? [place] RF model [id number] serial [id number]  ? PACEMAKER PLACEMENT    ? replaced 3 x  ? PERMANENT PACEMAKER GENERATOR CHANGE N/A 01/31/2012  ? Procedure: PERMANENT PACEMAKER GENERATOR CHANGE;  Surgeon: Sanda Klein, MD;  Location: [facility] CATH LAB;   ? PLEURADESIS N/A 04/02/2016  ? Procedure: PLEURADESIS;  Surgeon: Ivin Poot, MD;  Location: [facility] OR;  Service: Thoracic;  Laterality: N/A;  ? REMOVAL OF PLEURAL DRAINAGE CATHETER Right 07/26/2016  ? Procedure: REMOVAL OF PLEURAL DRAINAGE CATHETER;  Surgeon: Ivin Poot, MD;  Location: [facility];  Service: Thoracic;  Laterality: Right;  ? TONSILLECTOMY    ? TRANSTHORACIC ECHOCARDIOGRAM  11/04/2012  ? EF 16-60%, systolic function moderately reduced, mild regurg of the aortic and mitral valves.  ? VIDEO ASSISTED THORACOSCOPY Right 04/02/2016  ? Procedure: Right VIDEO ASSISTED THORACOSCOPY with Biopsies and drainage pleural effusion;  Surgeon: Ivin Poot, MD;  Location: [facility];  Service: Thoracic;  Laterality: Right;  ? ? ?Family History  ?Problem Relation Age of Onset  ? Prostate cancer Father   ? Heart disease Mother   ? Stroke Mother   ? Diabetes Maternal Grandmother   ? Colon cancer Neg Hx   ? Heart attack Neg Hx   ? Esophageal cancer Neg Hx   ?  Stomach cancer Neg Hx   ? Rectal cancer Neg Hx   ? ? ?Social History  ? ?Socioeconomic History  ? Marital status: Married  ?  Spouse name: Kyndal Gloster  ? Number of children: 3  ? Years of education: 59  ? Highest education level: Not on file  ?Occupational History  ? Occupation: Retired  ?Tobacco Use  ? Smoking status: Never  ? Smokeless tobacco: Never  ?Vaping Use  ? Vaping Use: Never used  ?Substance and Sexual Activity  ? Alcohol use: No  ?  Alcohol/week: 0.0 standard drinks  ? Drug use: No  ? Sexual activity: Yes  ?Other Topics Concern  ? Not on file  ?Social History Narrative  ? Fun: Golf when he can  ? Consumes caffeine 2 cups per day    ? ?Social Determinants of Health  ? ?Financial Resource Strain: Not on file  ?Food Insecurity: Not on file  ?Transportation Needs: Not on file  ?Physical Activity: Not on file  ?Stress: Not on file  ?Social Connections: Not on file  ?Intimate Partner Violence: Not on file  ? ? ?Outpatient Medications Prior to Visit  ?Medication Sig Dispense Refill  ? acetaminophen (TYLENOL) 500 MG tablet Take 1,000 mg by mouth every 6 (six) hours as needed for moderate pain.     ? Evolocumab (REPATHA SURECLICK) 630 MG/ML SOAJ Inject 140 mg into the skin every 14 (fourteen) days. 2 mL 11  ? furosemide (LASIX) 20 MG tablet TAKE 1 TO 2 TABLETS BY MOUTH DAILY AS DIRECTED 180 tablet 3  ? metoprolol succinate (TOPROL-XL) 25 MG 24 hr tablet TAKE 1 TABLET BY MOUTH TWICE A DAY 180 tablet 3  ? pravastatin (PRAVACHOL) 80 MG tablet TAKE 1 TABLET BY MOUTH EVERY DAY 90 tablet 1  ? tamsulosin (FLOMAX) 0.4 MG CAPS capsule Take 0.4 mg by mouth at bedtime.    ? traZODone (DESYREL) 50 MG tablet TAKE 1/2 TO 1 TABLET BY MOUTH AT BEDTIME AS NEEDED FOR SLEEP 90 tablet 1  ? triamcinolone cream (KENALOG) 0.1 % APPLY ON THE SKIN TWICE A DAY AS NEEDED    ? warfarin (COUMADIN) 2.5 MG tablet TAKE 1 TO 1.5 TABLETS BY MOUTH DAILY AS DIRECTED BY THE COUMADIN CLINIC 135 tablet 1  ? ezetimibe (ZETIA) 10 MG tablet Take 1  tablet (10 mg total) by mouth daily. 90 tablet 0  ? spironolactone (ALDACTONE) 25 MG tablet Take 1 tablet (25 mg total) by mouth daily. 90 tablet 3  ? ?No facility-administered medications prior to visit.  ? ? ?Allergies  ?Allergen Reactions  ? Statins Other (See Comments)  ?  Leg cramps.   Tolerates pravastatin.    ? Adhesive [Tape] Other (See Comments)  ?  Skin irritation - please use paper tape  ? Latex Other (See Comments)  ?  Skin irritation  ? Penicillins  Rash  ?  Has patient had a PCN reaction causing immediate rash, facial/tongue/throat swelling, SOB or lightheadedness with hypotension: Yes ?Has patient had a PCN reaction causing severe rash involving mucus membranes or skin necrosis: No ?Has patient had a PCN reaction that required hospitalization No ?Has patient had a PCN reaction occurring within the last 10 years: No ?If all of the above answers are "NO", then may proceed with Cephalosporin use.  ? ? ?Review of Systems ?All review of systems negative except what is listed in the HPI ? ? ?   ?Objective:  ?  ?Physical Exam ?Vitals reviewed.  ?Constitutional:   ?   General: He is not in acute distress. ?   Appearance: Normal appearance. He is not ill-appearing.  ?HENT:  ?   Head: Normocephalic and atraumatic.  ?   Right Ear: Tympanic membrane normal.  ?   Left Ear: Tympanic membrane normal.  ?   Nose: Congestion present.  ?   Mouth/Throat:  ?   Mouth: Mucous membranes are moist.  ?   Pharynx: Oropharynx is clear. No oropharyngeal exudate or posterior oropharyngeal erythema.  ?Cardiovascular:  ?   Rate and Rhythm: Normal rate and regular rhythm.  ?Pulmonary:  ?   Effort: Pulmonary effort is normal.  ?   Breath sounds: Normal breath sounds. No wheezing, rhonchi or rales.  ?Musculoskeletal:  ?   Cervical back: Normal range of motion and neck supple.  ?Skin: ?   General: Skin is warm and dry.  ?Neurological:  ?   General: No focal deficit present.  ?   Mental Status: He is alert and oriented to person, place,  and time. Mental status is at baseline.  ?Psychiatric:     ?   Mood and Affect: Mood normal.     ?   Behavior: Behavior normal.     ?   Thought Content: Thought content normal.     ?   Judgment: Judgment norma

## 2022-01-02 NOTE — Progress Notes (Signed)
Cough since Sunday ?Productive this morning ?"Stuffiness" ?Taking Tylenol and Dayquil ?

## 2022-01-02 NOTE — Patient Instructions (Signed)
There are a lot of viruses going around. Lungs sound good today. Let's try a few more days of conservative measures. If not improving, call back on Friday morning and I will give you an antibiotic before the weekend.  ?Continue supportive measures including rest, hydration, humidifier use, steam showers, warm compresses to sinuses, warm liquids with lemon and honey, and over-the-counter cough, cold, and analgesics as needed.  ?Sending in Mucinex, Flonase, Zyrtec, and Tessalon for you to take over the next several days as needed.  ? ?Follow-up sooner if symptoms worsen.  ?

## 2022-01-08 ENCOUNTER — Ambulatory Visit (INDEPENDENT_AMBULATORY_CARE_PROVIDER_SITE_OTHER): Payer: PPO | Admitting: Family

## 2022-01-08 ENCOUNTER — Encounter: Payer: Self-pay | Admitting: Family

## 2022-01-08 VITALS — BP 142/80 | HR 65 | Temp 97.6°F | Ht 69.0 in | Wt 208.4 lb

## 2022-01-08 DIAGNOSIS — J209 Acute bronchitis, unspecified: Secondary | ICD-10-CM

## 2022-01-08 MED ORDER — HYDROCODONE BIT-HOMATROP MBR 5-1.5 MG/5ML PO SOLN
5.0000 mL | Freq: Three times a day (TID) | ORAL | 0 refills | Status: DC | PRN
Start: 2022-01-08 — End: 2022-05-10

## 2022-01-08 MED ORDER — PREDNISONE 20 MG PO TABS: 20.0000 mg | ORAL_TABLET | Freq: Every day | ORAL | 0 refills | Status: DC

## 2022-01-08 MED ORDER — AZITHROMYCIN 250 MG PO TABS: ORAL_TABLET | ORAL | 0 refills | Status: DC

## 2022-01-08 NOTE — Progress Notes (Signed)
[name] is a 76 y.o. male with the following history as recorded in EpicCare:  ?Patient Active Problem List  ? Diagnosis Date Noted  ? Medicare annual wellness visit, subsequent 07/21/2017  ? Routine adult health maintenance 07/21/2017  ? Pleural effusion, malignant 10/02/2016  ? Rotator cuff tendinitis 09/10/2016  ? Ventricular tachycardia 06/12/2016  ? 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 Insomnia 03/08/2016  ? Solitary pulmonary nodule 01/09/2016  ? Hypercholesterolemia 12/12/2015  ? History of sustained ventricular tachycardia 01/30/2015  ? Chronic combined systolic (congestive) and diastolic (congestive) heart failure ([place]) 12/18/2014  ? Warfarin anticoagulation 12/08/2014  ? ARF (acute renal failure) ([place]) 12/07/2014  ? Cardiomyopathy, ischemic 11/07/2013  ? CHB (complete heart block) ([place]) 11/06/2013  ? Single chamber St. Jude pacemaker 2013 11/06/2013  ? Chronic atrial fibrillation ([place]) 01/29/2013  ? Long term (current) use of anticoagulants 01/29/2013  ? HTN (hypertension) 02/01/2012  ? Carotid stenosis 02/01/2012  ? Renal artery stenosis ([place]) 02/01/2012  ? Coronary artery disease involving native coronary artery of native heart with angina pectoris ([place]) 02/01/2012  ? Esophageal reflux 08/21/2011  ? Hemorrhage of rectum and anus 04/26/2011  ? HERNIA, UMBILICAL 42/70/6237  ? UNSPECIFIED CARDIAC DYSRHYTHMIA 11/11/2007  ? Transient cerebral ischemia 11/11/2007  ? ESOPHAGEAL STRICTURE 11/11/2007  ? DIVERTICULOSIS OF COLON 11/11/2007  ? Non-Hodgkin's lymphoma of intestine ([place]) 09/03/2007  ?  ?Current Outpatient Medications  ?Medication Sig Dispense Refill  ? acetaminophen (TYLENOL) 500 MG tablet Take 1,000 mg by mouth every 6 (six) hours as needed for moderate pain.     ? azithromycin (ZITHROMAX) 250 MG tablet 2 tabs po qd x 1 day; 1 tablet per day x 4 days; 6 tablet 0  ? benzonatate (TESSALON) 200 MG capsule Take 1 capsule (200 mg total) by mouth 2 (two) times daily as needed for  cough. 20 capsule 0  ? Evolocumab (REPATHA SURECLICK) 628 MG/ML SOAJ Inject 140 mg into the skin every 14 (fourteen) days. 2 mL 11  ? furosemide (LASIX) 20 MG tablet TAKE 1 TO 2 TABLETS BY MOUTH DAILY AS DIRECTED 180 tablet 3  ? HYDROcodone bit-homatropine (HYCODAN) 5-1.5 MG/5ML syrup Take 5 mLs by mouth every 8 (eight) hours as needed for cough. 120 mL 0  ? metoprolol succinate (TOPROL-XL) 25 MG 24 hr tablet TAKE 1 TABLET BY MOUTH TWICE A DAY 180 tablet 3  ? pravastatin (PRAVACHOL) 80 MG tablet TAKE 1 TABLET BY MOUTH EVERY DAY 90 tablet 1  ? predniSONE (DELTASONE) 20 MG tablet Take 1 tablet (20 mg total) by mouth daily with breakfast. 5 tablet 0  ? tamsulosin (FLOMAX) 0.4 MG CAPS capsule Take 0.4 mg by mouth at bedtime.    ? traZODone (DESYREL) 50 MG tablet TAKE 1/2 TO 1 TABLET BY MOUTH AT BEDTIME AS NEEDED FOR SLEEP 90 tablet 1  ? triamcinolone cream (KENALOG) 0.1 % APPLY ON THE SKIN TWICE A DAY AS NEEDED    ? warfarin (COUMADIN) 2.5 MG tablet TAKE 1 TO 1.5 TABLETS BY MOUTH DAILY AS DIRECTED BY THE COUMADIN CLINIC 135 tablet 1  ? cetirizine (ZYRTEC) 10 MG tablet Take 1 tablet (10 mg total) by mouth daily. (Patient not taking: Reported on 01/08/2022) 30 tablet 11  ? fluticasone (FLONASE) 50 MCG/ACT nasal spray SPRAY 2 SPRAYS INTO EACH NOSTRIL EVERY DAY (Patient not taking: Reported on 01/08/2022) 48 mL 0  ? guaiFENesin (MUCINEX) 600 MG 12 hr tablet Take 2 tablets (1,200 mg total) by mouth 2 (two) times daily. (Patient not taking: Reported on  01/08/2022) 30 tablet 2  ? ?No current facility-administered medications for this visit.  ?  ?Allergies: Statins, Adhesive [tape], Latex, and Penicillins  ?Past Medical History:  ?Diagnosis Date  ? Atrial fibrillation ([place])   ? Cardiomyopathy, ischemic 11/07/2013  ? Cataract   ? bil cataracts removed  ? CHB (complete heart block) ([place]) 11/06/2013  ? Chronic combined systolic and diastolic CHF (congestive heart failure) ([place])   ? Clotting disorder ([place])   ? TIA , cva - PT ON  COUMADINE  ? Colon polyps   ? Colon polyps   ? Congenital heart block   ? Constipation   ? CVA (cerebral infarction)   ? Diverticulosis   ? Esophageal stricture   ? Family history of adverse reaction to anesthesia   ? Heart murmur   ? Hyperlipidemia   ? Hypertension   ? Hyperthyroidism 11/16/2015  ? Lymphoma ([place]) 1998  ? of colon   ? Presence of permanent cardiac pacemaker   ? St. Jude  pt.states he is totally dependent on pacemaker  ? Shortness of breath dyspnea   ? Pulmonary Effusion  ? Stroke [facility])   ? TIA, cva  ? Umbilical hernia   ? Urinary frequency   ?  ?Past Surgical History:  ?Procedure Laterality Date  ? APPENDECTOMY  1953  ? CARDIAC CATHETERIZATION  01/06/2003  ? Recommend medical therapy  ? CARDIOVASCULAR STRESS TEST  07/16/2012  ? Mild-moderate perfusion defect seen in Basal inferior, Mid inferior, and Apica lateral consistent with infarct/scar. No scintigraphic evidence for inducible myocardial ischemia. No ECG changes. EKG negative for ischemia.  ? CAROTID DOPPLER  11/04/2012  ? Proximal Rt ICA 50-99% diameter reduction; Lft Bulb demonstrated mild amount homogeneous plaque-not hemodynamically significant; Lft ICA-normal patency.  ? CHEST TUBE INSERTION Right 03/15/2016  ? Procedure: INSERTION PLEURAL DRAINAGE CATHETER;  Surgeon: Ivin Poot, MD;  Location: [facility];  Service: Thoracic;  Laterality: Right;  ? COLECTOMY    ? for lymphoma  ? COLONOSCOPY    ? neck fusion    ? PACEMAKER GENERATOR CHANGE  01/31/2012  ? [employer] RF model [id number] serial [id number]  ? PACEMAKER PLACEMENT    ? replaced 3 x  ? PERMANENT PACEMAKER GENERATOR CHANGE N/A 01/31/2012  ? Procedure: PERMANENT PACEMAKER GENERATOR CHANGE;  Surgeon: Sanda Klein, MD;  Location: [place] CATH LAB;   ? PLEURADESIS N/A 04/02/2016  ? Procedure: PLEURADESIS;  Surgeon: Ivin Poot, MD;  [facility];  Service: Thoracic;  Laterality: N/A;  ? REMOVAL OF PLEURAL DRAINAGE CATHETER Right 07/26/2016  ? Procedure: REMOVAL OF PLEURAL DRAINAGE  CATHETER;  Surgeon: Ivin Poot, MD;  Location: [facility] [place];  Service: Thoracic;  Laterality: Right;  ? TONSILLECTOMY    ? TRANSTHORACIC ECHOCARDIOGRAM  11/04/2012  ? EF 57-90%, systolic function moderately reduced, mild regurg of the aortic and mitral valves.  ? VIDEO ASSISTED THORACOSCOPY Right 04/02/2016  ? Procedure: Right VIDEO ASSISTED THORACOSCOPY with Biopsies and drainage pleural effusion;  Surgeon: Ivin Poot, MD;  Location: [facility] [place];  Service: Thoracic;  Laterality: Right;  ?  ?Family History  ?Problem Relation Age of Onset  ? Prostate cancer Father   ? Heart disease Mother   ? Stroke Mother   ? Diabetes Maternal Grandmother   ? Colon cancer Neg Hx   ? Heart attack Neg Hx   ? Esophageal cancer Neg Hx   ? Stomach cancer Neg Hx   ? Rectal cancer Neg Hx   ?  ?Social  History  ? ?Tobacco Use  ? Smoking status: Never  ? Smokeless tobacco: Never  ?Substance Use Topics  ? Alcohol use: No  ?  Alcohol/week: 0.0 standard drinks  ?  ?Subjective:  ? ?Seen last week with cough/ congestion; treated for suspected viral illness with Mucinex, Flonase, Zyrtec and Tessalon; concerned about persisting symptoms; + coughing fits;  ?+ drainage in back of throat;  ?PCN allergy/ on Coumadin;  ? ? ? ?Objective:  ?Vitals:  ? 01/08/22 1336  ?BP: (!) 142/80  ?Pulse: 65  ?Temp: 97.6 ?F (36.4 ?C)  ?TempSrc: Oral  ?SpO2: 97%  ?Weight: 208 lb 6.4 oz (94.5 kg)  ?Height: '5\' 9"'$  (1.753 m)  ?  ?General: Well developed, well nourished, in no acute distress  ?Skin : Warm and dry.  ?Head: Normocephalic and atraumatic  ?Eyes: Sclera and conjunctiva clear; pupils round and reactive to light; extraocular movements intact  ?Ears: External normal; canals clear; tympanic membranes normal  ?Oropharynx: Pink, supple. No suspicious lesions  ?Neck: Supple without thyromegaly, adenopathy  ?Lungs: Respirations unlabored; clear to auscultation bilaterally without wheeze, rales, rhonchi; coarse breath sounds ?CVS exam: normal rate and regular rhythm.   ?Neurologic: Alert and oriented; speech intact; face symmetrical; moves all extremities well; CNII-XII intact without focal deficit  ? ?Assessment:  ?1. Acute bronchitis, unspecified organism   ?  ?Plan:  ?Rx for Z-pak, prednion

## 2022-01-14 ENCOUNTER — Ambulatory Visit (INDEPENDENT_AMBULATORY_CARE_PROVIDER_SITE_OTHER): Payer: PPO

## 2022-01-14 ENCOUNTER — Ambulatory Visit
Admission: RE | Admit: 2022-01-14 | Discharge: 2022-01-14 | Disposition: A | Discharge status: Home / Self Care | Payer: PPO | Source: Ambulatory Visit | Attending: Cardiovascular Disease | Admitting: Cardiovascular Disease

## 2022-01-14 ENCOUNTER — Ambulatory Visit (INDEPENDENT_AMBULATORY_CARE_PROVIDER_SITE_OTHER): Payer: PPO | Admitting: Cardiovascular Disease

## 2022-01-14 ENCOUNTER — Encounter: Payer: Self-pay | Admitting: Cardiovascular Disease

## 2022-01-14 VITALS — BP 160/94 | HR 77 | Ht 69.0 in | Wt 210.0 lb

## 2022-01-14 DIAGNOSIS — I4811 Longstanding persistent atrial fibrillation: Secondary | ICD-10-CM

## 2022-01-14 DIAGNOSIS — R059 Cough, unspecified: Secondary | ICD-10-CM | POA: Diagnosis not present

## 2022-01-14 DIAGNOSIS — I6523 Occlusion and stenosis of bilateral carotid arteries: Secondary | ICD-10-CM

## 2022-01-14 DIAGNOSIS — J90 Pleural effusion, not elsewhere classified: Secondary | ICD-10-CM

## 2022-01-14 DIAGNOSIS — E7800 Pure hypercholesterolemia, unspecified: Secondary | ICD-10-CM

## 2022-01-14 DIAGNOSIS — I442 Atrioventricular block, complete: Secondary | ICD-10-CM

## 2022-01-14 DIAGNOSIS — I482 Chronic atrial fibrillation, unspecified: Secondary | ICD-10-CM | POA: Diagnosis not present

## 2022-01-14 DIAGNOSIS — Z7901 Long term (current) use of anticoagulants: Secondary | ICD-10-CM

## 2022-01-14 DIAGNOSIS — I2510 Atherosclerotic heart disease of native coronary artery without angina pectoris: Secondary | ICD-10-CM

## 2022-01-14 DIAGNOSIS — Z950 Presence of cardiac pacemaker: Secondary | ICD-10-CM

## 2022-01-14 DIAGNOSIS — I10 Essential (primary) hypertension: Secondary | ICD-10-CM

## 2022-01-14 DIAGNOSIS — D6869 Other thrombophilia: Secondary | ICD-10-CM

## 2022-01-14 LAB — POCT INR: INR: 2.2 (ref 2.0–3.0)

## 2022-01-14 NOTE — Patient Instructions (Signed)
Continue taking 1.5 tablets every day except 1 tablet Monday and Friday. Repeat INR in 7 weeks. ?

## 2022-01-14 NOTE — Patient Instructions (Signed)
Medication Instructions:  ?No changes ?*If you need a refill on your cardiac medications before your next appointment, please call your pharmacy* ? ? ?Lab Work: ?None ordered ?If you have labs (blood work) drawn today and your tests are completely normal, you will receive your results only by: ?MyChart Message (if you have MyChart) OR ?A paper copy in the mail ?If you have any lab test that is abnormal or we need to change your treatment, we will call you to review the results. ? ? ?Testing/Procedures: ?A chest x-ray takes a picture of the organs and structures inside the chest, including the heart, lungs, and blood vessels. This test can show several things, including, whether the heart is enlarges; whether fluid is building up in the lungs; and whether pacemaker / defibrillator leads are still in place. ? ?This can be done at [facility]. You do not need an appointment ?Follow-Up: ?At [facility], you and your health needs are our priority.  As part of our continuing mission to provide you with exceptional heart care, we have created designated Provider Care Teams.  These Care Teams include your primary Cardiologist (physician) and Advanced Practice Providers (APPs -  Physician Assistants and Nurse Practitioners) who all work together to provide you with the care you need, when you need it. ? ?We recommend signing up for the patient portal called "MyChart".  Sign up information is provided on this After Visit Summary.  MyChart is used to connect with patients for Virtual Visits (Telemedicine).  Patients are able to view lab/test results, encounter notes, upcoming appointments, etc.  Non-urgent messages can be sent to your provider as well.   ?To learn more about what you can do with MyChart, go to [email or web address].   ? ?Your next appointment:   ?10 month(s) ? ?The format for your next appointment:   ?In Person ? ?Provider:   ?Sanda Klein, MD   ? ? ?Other Instructions ?Dr. Sallyanne Kuster would like  you to check your blood pressure daily for the next 2 weeks.  Keep a journal of these daily blood pressure and heart rate readings and call our office or send a message through [place] with the results. Thank you! ? ?It is best to check your BP 1-2 hours after taking your medications to see the medications effectiveness on your BP.  ?  ?Here are some tips that our clinical pharmacists share for home BP monitoring: ??         Rest 10 minutes before taking your blood pressure. ??         Don't smoke or drink caffeinated beverages for at least 30 minutes before. ??         Take your blood pressure before (not after) you eat. ??         Sit comfortably with your back supported and both feet on the floor (don't cross your legs). ??         Elevate your arm to heart level on a table or a desk. ??         Use the proper sized cuff. It should fit smoothly and snugly around your bare upper arm. There should be enough room to slip a fingertip under the cuff. The bottom edge of the cuff should be 1 inch above the crease of the elbow. ? ? ?

## 2022-01-15 ENCOUNTER — Encounter: Payer: Self-pay | Admitting: *Deleted

## 2022-01-17 NOTE — Progress Notes (Signed)
. ?Patient ID: Benjamin Mckay, male   DOB: [birth year], 76 y.o.   MRN: [id number] ?Patient ID: Benjamin Mckay, male   DOB: 04-Jun-1946, 76 y.o.   MRN: [id number] ?  ? ?Cardiology Office Note   ? ?Date:  01/17/2022  ? ?ID:  Benjamin Mckay, DOB [birth year], MRN [id number] ? ?PCP:  [name], [place]  ?Cardiologist:   Sanda Klein, MD  ? ?Chief complaint: AFib, Pacemaker check ? ? ?History of Present Illness:  ?Benjamin Mckay is a 76 y.o. male with complex cardiac history (intermittent complete heart block-pacemaker dependent, chronic atrial fibrillation versus atrial standstill, history of sustained ventricular tachycardia and near syncope, coronary artery disease with history of inferior wall scar due to myocardial infarction, renal and carotid artery stenosis), amiodarone induced hyperthyroidism (now quiescent) and a recurrent right pleural effusion (s/p pleurodesis June 2017). ? ?Doing well.  Continues to play golf at least a couple of days a week.  He had a recent upper respiratory tract infection that has required antibiotics and 2 rounds of prednisone.  He denies exertional dyspnea, orthopnea or PND but has a persistent cough for the last 5 or 6 weeks.  He denies lower extremity edema, orthopnea, PND and is never aware of palpitations.  He has not had dizziness or syncope.  Denies falls, injuries or serious bleeding. ? ?His blood pressure is a little high today, possibly due to treatment with prednisone.  Typically it is less than 140/80. ? ?Very surprisingly, his electrocardiogram today shows a period of clear-cut normal sinus rhythm.  He had been felt to have atrial standstill and/or atrial fibrillation for years now.  He has a single-chamber pacemaker for this reason.  This probably explains why he sometimes has episodes of since regular rhythm without change in QRS morphology recorded by his device. ? ?St Jude Accent (2013) single-chamber pacemaker interrogation shows normal device function.  Estimated  generator longevity is approximately 1 year.  He has 57 % ventricular pacing.  As before, he has occasional episodes of regular tachycardia with intracardiac electrogram that is indistinguishable from the electrogram during atrial fibrillation.  With the information from today's ECG, this probably represents sinus tachycardia, although it could also be atrial flutter with 2: 1 AV block. ? ?His most recent LDL cholesterol was excellent at 54 on pravastatin.  He is compliant with warfarin anticoagulation and monitoring, INR today was 2.2..  He has not had a subtherapeutic INR since July 2022. ? ?Past Medical History:  ?Diagnosis Date  ? Atrial fibrillation ([place])   ? Cardiomyopathy, ischemic 11/07/2013  ? Cataract   ? bil cataracts removed  ? CHB (complete heart block) ([place]) 11/06/2013  ? Chronic combined systolic and diastolic CHF (congestive heart failure) ([place])   ? Clotting disorder ([place])   ? TIA , cva - PT ON COUMADINE  ? Colon polyps   ? Colon polyps   ? Congenital heart block   ? Constipation   ? CVA (cerebral infarction)   ? Diverticulosis   ? Esophageal stricture   ? Family history of adverse reaction to anesthesia   ? Heart murmur   ? Hyperlipidemia   ? Hypertension   ? Hyperthyroidism 11/16/2015  ? Lymphoma ([place]) 1998  ? of colon   ? Presence of permanent cardiac pacemaker   ? St. Jude  pt.states he is totally dependent on pacemaker  ? Shortness of breath dyspnea   ? Pulmonary Effusion  ? Stroke [facility])   ? TIA, cva  ?  Umbilical hernia   ? Urinary frequency   ? ? ?Past Surgical History:  ?Procedure Laterality Date  ? APPENDECTOMY  1953  ? CARDIAC CATHETERIZATION  01/06/2003  ? Recommend medical therapy  ? CARDIOVASCULAR STRESS TEST  07/16/2012  ? Mild-moderate perfusion defect seen in Basal inferior, Mid inferior, and Apica lateral consistent with infarct/scar. No scintigraphic evidence for inducible myocardial ischemia. No ECG changes. EKG negative for ischemia.  ? CAROTID DOPPLER  11/04/2012  ? Proximal Rt ICA  50-99% diameter reduction; Lft Bulb demonstrated mild amount homogeneous plaque-not hemodynamically significant; Lft ICA-normal patency.  ? CHEST TUBE INSERTION Right 03/15/2016  ? Procedure: INSERTION PLEURAL DRAINAGE CATHETER;  Surgeon: Ivin Poot, MD;  Location: [facility];  Service: Thoracic;  Laterality: Right;  ? COLECTOMY    ? for lymphoma  ? COLONOSCOPY    ? neck fusion    ? PACEMAKER GENERATOR CHANGE  01/31/2012  ? [place] RF model [id number] serial [id number]  ? PACEMAKER PLACEMENT    ? replaced 3 x  ? PERMANENT PACEMAKER GENERATOR CHANGE N/A 01/31/2012  ? Procedure: PERMANENT PACEMAKER GENERATOR CHANGE;  Surgeon: Sanda Klein, MD;  Location: [place] CATH LAB;   ? PLEURADESIS N/A 04/02/2016  ? Procedure: PLEURADESIS;  Surgeon: Ivin Poot, MD;  Location: [facility] OR;  Service: Thoracic;  Laterality: N/A;  ? REMOVAL OF PLEURAL DRAINAGE CATHETER Right 07/26/2016  ? Procedure: REMOVAL OF PLEURAL DRAINAGE CATHETER;  Surgeon: Ivin Poot, MD;  Location: [place];  Service: Thoracic;  Laterality: Right;  ? TONSILLECTOMY    ? TRANSTHORACIC ECHOCARDIOGRAM  11/04/2012  ? EF 17-40%, systolic function moderately reduced, mild regurg of the aortic and mitral valves.  ? VIDEO ASSISTED THORACOSCOPY Right 04/02/2016  ? Procedure: Right VIDEO ASSISTED THORACOSCOPY with Biopsies and drainage pleural effusion;  Surgeon: Ivin Poot, MD;  Location: [place];  Service: Thoracic;  Laterality: Right;  ? ? ?Current Medications: ?Outpatient Medications Prior to Visit  ?Medication Sig Dispense Refill  ? acetaminophen (TYLENOL) 500 MG tablet Take 1,000 mg by mouth every 6 (six) hours as needed for moderate pain.     ? benzonatate (TESSALON) 200 MG capsule Take 1 capsule (200 mg total) by mouth 2 (two) times daily as needed for cough. 20 capsule 0  ? furosemide (LASIX) 20 MG tablet TAKE 1 TO 2 TABLETS BY MOUTH DAILY AS DIRECTED 180 tablet 3  ? HYDROcodone bit-homatropine (HYCODAN) 5-1.5 MG/5ML syrup Take 5 mLs by mouth every 8 (eight)  hours as needed for cough. 120 mL 0  ? metoprolol succinate (TOPROL-XL) 25 MG 24 hr tablet TAKE 1 TABLET BY MOUTH TWICE A DAY 180 tablet 3  ? pravastatin (PRAVACHOL) 80 MG tablet TAKE 1 TABLET BY MOUTH EVERY DAY 90 tablet 1  ? tamsulosin (FLOMAX) 0.4 MG CAPS capsule Take 0.4 mg by mouth at bedtime.    ? traZODone (DESYREL) 50 MG tablet TAKE 1/2 TO 1 TABLET BY MOUTH AT BEDTIME AS NEEDED FOR SLEEP 90 tablet 1  ? warfarin (COUMADIN) 2.5 MG tablet TAKE 1 TO 1.5 TABLETS BY MOUTH DAILY AS DIRECTED BY THE COUMADIN CLINIC 135 tablet 1  ? azithromycin (ZITHROMAX) 250 MG tablet 2 tabs po qd x 1 day; 1 tablet per day x 4 days; 6 tablet 0  ? cetirizine (ZYRTEC) 10 MG tablet Take 1 tablet (10 mg total) by mouth daily. (Patient not taking: Reported on 01/08/2022) 30 tablet 11  ? Evolocumab (REPATHA SURECLICK) 814 MG/ML SOAJ Inject 140 mg into the  skin every 14 (fourteen) days. (Patient not taking: Reported on 01/14/2022) 2 mL 11  ? fluticasone (FLONASE) 50 MCG/ACT nasal spray SPRAY 2 SPRAYS INTO EACH NOSTRIL EVERY DAY (Patient not taking: Reported on 01/08/2022) 48 mL 0  ? guaiFENesin (MUCINEX) 600 MG 12 hr tablet Take 2 tablets (1,200 mg total) by mouth 2 (two) times daily. (Patient not taking: Reported on 01/08/2022) 30 tablet 2  ? predniSONE (DELTASONE) 20 MG tablet Take 1 tablet (20 mg total) by mouth daily with breakfast. (Patient not taking: Reported on 01/14/2022) 5 tablet 0  ? triamcinolone cream (KENALOG) 0.1 % APPLY ON THE SKIN TWICE A DAY AS NEEDED    ? ?No facility-administered medications prior to visit.  ?  ? ?Allergies:   Statins, Adhesive [tape], Latex, and Penicillins  ? ?Social History  ? ?Socioeconomic History  ? Marital status: Married  ?  Spouse name: Erik Burkett  ? Number of children: 3  ? Years of education: 40  ? Highest education level: Not on file  ?Occupational History  ? Occupation: Retired  ?Tobacco Use  ? Smoking status: Never  ? Smokeless tobacco: Never  ?Vaping Use  ? Vaping Use: Never used   ?Substance and Sexual Activity  ? Alcohol use: No  ?  Alcohol/week: 0.0 standard drinks  ? Drug use: No  ? Sexual activity: Yes  ?Other Topics Concern  ? Not on file  ?Social History Narrative  ? Fun: Golf when he

## 2022-01-19 ENCOUNTER — Other Ambulatory Visit: Payer: Self-pay | Admitting: Cardiovascular Disease

## 2022-02-27 ENCOUNTER — Ambulatory Visit (INDEPENDENT_AMBULATORY_CARE_PROVIDER_SITE_OTHER): Payer: PPO

## 2022-02-27 DIAGNOSIS — I442 Atrioventricular block, complete: Secondary | ICD-10-CM | POA: Diagnosis not present

## 2022-02-28 LAB — CUP PACEART REMOTE DEVICE CHECK
Battery Remaining Longevity: 9 mo
Battery Remaining Percentage: 7 %
Battery Voltage: 2.78 V
Brady Statistic RV Percent Paced: 59 %
Date Time Interrogation Session: 20230518060117
Implantable Lead Implant Date: 20130419
Implantable Lead Location: 753860
Implantable Pulse Generator Implant Date: 20130419
Lead Channel Impedance Value: 430 Ohm
Lead Channel Pacing Threshold Amplitude: 0.75 V
Lead Channel Pacing Threshold Pulse Width: 0.5 ms
Lead Channel Sensing Intrinsic Amplitude: 12 mV
Lead Channel Setting Pacing Amplitude: 2.5 V
Lead Channel Setting Pacing Pulse Width: 0.5 ms
Lead Channel Setting Sensing Sensitivity: 2.5 mV
Pulse Gen Model: 1210
Pulse Gen Serial Number: 7313697

## 2022-03-04 ENCOUNTER — Ambulatory Visit (INDEPENDENT_AMBULATORY_CARE_PROVIDER_SITE_OTHER): Payer: PPO

## 2022-03-04 DIAGNOSIS — I482 Chronic atrial fibrillation, unspecified: Secondary | ICD-10-CM | POA: Diagnosis not present

## 2022-03-04 DIAGNOSIS — Z7901 Long term (current) use of anticoagulants: Secondary | ICD-10-CM

## 2022-03-04 LAB — POCT INR: INR: 2.1 (ref 2.0–3.0)

## 2022-03-04 NOTE — Patient Instructions (Signed)
Continue taking 1.5 tablets every day except 1 tablet Monday and Friday. Repeat INR in 8 weeks.

## 2022-03-13 NOTE — Progress Notes (Signed)
Remote pacemaker transmission.   

## 2022-03-29 ENCOUNTER — Other Ambulatory Visit: Payer: Self-pay | Admitting: Cardiovascular Disease

## 2022-03-29 NOTE — Telephone Encounter (Signed)
Prescription refill request received for warfarin Lov: croitoru 01/14/2022 Next INR check: 7/17 Warfarin tablet strength: 2.5 mg   Refill sent

## 2022-04-02 ENCOUNTER — Telehealth: Payer: Self-pay

## 2022-04-02 NOTE — Telephone Encounter (Signed)
Error

## 2022-04-15 ENCOUNTER — Other Ambulatory Visit: Payer: Self-pay | Admitting: Cardiovascular Disease

## 2022-04-15 DIAGNOSIS — I5023 Acute on chronic systolic (congestive) heart failure: Secondary | ICD-10-CM

## 2022-04-29 ENCOUNTER — Ambulatory Visit (INDEPENDENT_AMBULATORY_CARE_PROVIDER_SITE_OTHER): Payer: PPO

## 2022-04-29 DIAGNOSIS — I482 Chronic atrial fibrillation, unspecified: Secondary | ICD-10-CM | POA: Diagnosis not present

## 2022-04-29 DIAGNOSIS — Z7901 Long term (current) use of anticoagulants: Secondary | ICD-10-CM

## 2022-04-29 LAB — POCT INR: INR: 1.9 — AB (ref 2.0–3.0)

## 2022-04-29 NOTE — Patient Instructions (Addendum)
Description   Take an extra 0.5 tablet today and then continue taking 1.5 tablets every day except 1 tablet Monday and Friday. Repeat INR in 6 weeks.

## 2022-05-10 ENCOUNTER — Ambulatory Visit (INDEPENDENT_AMBULATORY_CARE_PROVIDER_SITE_OTHER): Payer: PPO | Admitting: Family

## 2022-05-10 ENCOUNTER — Ambulatory Visit (HOSPITAL_BASED_OUTPATIENT_CLINIC_OR_DEPARTMENT_OTHER)
Admission: RE | Admit: 2022-05-10 | Discharge: 2022-05-10 | Disposition: A | Discharge status: Home / Self Care | Payer: PPO | Source: Ambulatory Visit | Attending: Family | Admitting: Family

## 2022-05-10 VITALS — BP 140/80 | HR 78 | Temp 97.5°F | Resp 16 | Ht 69.0 in | Wt 208.8 lb

## 2022-05-10 DIAGNOSIS — J069 Acute upper respiratory infection, unspecified: Secondary | ICD-10-CM

## 2022-05-10 DIAGNOSIS — R519 Headache, unspecified: Secondary | ICD-10-CM

## 2022-05-10 MED ORDER — FLUTICASONE PROPIONATE 50 MCG/ACT NA SUSP
NASAL | 0 refills | Status: DC
Start: 2022-05-10 — End: 2023-08-29

## 2022-05-10 NOTE — Progress Notes (Signed)
Benjamin Mckay is a 76 y.o. 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 Jude pacemaker 2013 11/06/2013   Chronic atrial fibrillation ([place]) 01/29/2013   Long term (current) use of anticoagulants 01/29/2013   HTN (hypertension) 02/01/2012   Carotid stenosis 02/01/2012   Renal artery stenosis ([place]) 02/01/2012   Coronary artery disease involving native coronary artery of native heart with angina pectoris ([place]) 02/01/2012   Esophageal reflux 08/21/2011   Hemorrhage of rectum and anus 99/37/1696   HERNIA, UMBILICAL 78/93/8101   UNSPECIFIED CARDIAC DYSRHYTHMIA 11/11/2007   Transient cerebral ischemia 11/11/2007   ESOPHAGEAL STRICTURE 11/11/2007   DIVERTICULOSIS OF COLON 11/11/2007   Non-Hodgkin's lymphoma of intestine ([place]) 09/03/2007    Current Outpatient Medications  Medication Sig Dispense Refill   acetaminophen (TYLENOL) 500 MG tablet Take 1,000 mg by mouth every 6 (six) hours as needed for moderate pain.      Evolocumab (REPATHA SURECLICK) 751 MG/ML SOAJ Inject 140 mg into the skin every 14 (fourteen) days. 2 mL 11   furosemide (LASIX) 20 MG tablet TAKE 1 TO 2 TABLETS BY MOUTH DAILY AS DIRECTED 180 tablet 3    metoprolol succinate (TOPROL-XL) 25 MG 24 hr tablet TAKE 1 TABLET BY MOUTH TWICE A DAY 180 tablet 3   pravastatin (PRAVACHOL) 80 MG tablet TAKE 1 TABLET BY MOUTH EVERY DAY 90 tablet 3   tamsulosin (FLOMAX) 0.4 MG CAPS capsule Take 0.4 mg by mouth at bedtime.     traZODone (DESYREL) 50 MG tablet TAKE 1/2 TO 1 TABLET BY MOUTH AT BEDTIME AS NEEDED FOR SLEEP 90 tablet 1   warfarin (COUMADIN) 2.5 MG tablet TAKE 1 TO 1.5 TABLETS BY MOUTH DAILY AS DIRECTED BY THE COUMADIN CLINIC 135 tablet 0   fluticasone (FLONASE) 50 MCG/ACT nasal spray SPRAY 2 SPRAYS INTO EACH NOSTRIL EVERY DAY 48 mL 0   No current facility-administered medications for this visit.    Allergies: Statins, Adhesive [tape], Latex, and Penicillins  Past Medical History:  Diagnosis Date   Atrial fibrillation ([place])    Cardiomyopathy, ischemic 11/07/2013   Cataract    bil cataracts removed   CHB (complete heart block) ([place]) 11/06/2013   Chronic combined systolic and diastolic CHF (congestive heart failure) (HCC)    Clotting disorder (HCC)    TIA , cva - PT ON COUMADINE   Colon polyps    Colon polyps    Congenital heart block    Constipation    CVA (cerebral infarction)    Diverticulosis    Esophageal stricture    Family history of adverse reaction to anesthesia    Heart murmur    Hyperlipidemia    Hypertension    Hyperthyroidism 11/16/2015   Lymphoma ([place]) 1998   of colon    Presence of permanent cardiac pacemaker    St. Jude  pt.states he  is totally dependent on pacemaker   Shortness of breath dyspnea    Pulmonary Effusion   Stroke ([place])    TIA, cva   Umbilical hernia    Urinary frequency     Past Surgical History:  Procedure Laterality Date   APPENDECTOMY  1953   CARDIAC CATHETERIZATION  01/06/2003   Recommend medical therapy   CARDIOVASCULAR STRESS TEST  07/16/2012   Mild-moderate perfusion defect seen in Basal inferior, Mid inferior, and Apica lateral consistent with infarct/scar. No scintigraphic evidence for  inducible myocardial ischemia. No ECG changes. EKG negative for ischemia.   CAROTID DOPPLER  11/04/2012   Proximal Rt ICA 50-99% diameter reduction; Lft Bulb demonstrated mild amount homogeneous plaque-not hemodynamically significant; Lft ICA-normal patency.   CHEST TUBE INSERTION Right 03/15/2016   Procedure: INSERTION PLEURAL DRAINAGE CATHETER;  Surgeon: Ivin Poot, MD;  Location: [facility];  Service: Thoracic;  Laterality: Right;   COLECTOMY     for lymphoma   COLONOSCOPY     neck fusion     PACEMAKER GENERATOR CHANGE  01/31/2012   St Jude Med Accent DR RF model [id number] serial [id number]   PACEMAKER PLACEMENT     replaced 3 x   PERMANENT PACEMAKER GENERATOR CHANGE N/A 01/31/2012   Procedure: PERMANENT PACEMAKER GENERATOR CHANGE;  Surgeon: Sanda Klein, MD;  Location: [facility] CATH LAB;    PLEURADESIS N/A 04/02/2016   Procedure: Antionette Poles;  Surgeon: Ivin Poot, MD;  Location: [facility];  Service: Thoracic;  Laterality: N/A;   REMOVAL OF PLEURAL DRAINAGE CATHETER Right 07/26/2016   Procedure: REMOVAL OF PLEURAL DRAINAGE CATHETER;  Surgeon: Ivin Poot, MD;  [facility];  Service: Thoracic;  Laterality: Right;   TONSILLECTOMY     TRANSTHORACIC ECHOCARDIOGRAM  11/04/2012   EF 74-25%, systolic function moderately reduced, mild regurg of the aortic and mitral valves.   VIDEO ASSISTED THORACOSCOPY Right 04/02/2016   Procedure: Right VIDEO ASSISTED THORACOSCOPY with Biopsies and drainage pleural effusion;  Surgeon: Ivin Poot, MD;  Location: [facility];  Service: Thoracic;  Laterality: Right;    Family History  Problem Relation Age of Onset   Prostate cancer Father    Heart disease Mother    Stroke Mother    Diabetes Maternal Grandmother    Colon cancer Neg Hx    Heart attack Neg Hx    Esophageal cancer Neg Hx    Stomach cancer Neg Hx    Rectal cancer Neg Hx     Social History   Tobacco Use   Smoking status: Never   Smokeless tobacco: Never  Substance Use Topics   Alcohol use: No     Alcohol/week: 0.0 standard drinks of alcohol    Subjective:   2-3 month history of right sided upper face/ jaw pain; has seen dentist and exam there was unremarkable; notes that pain is more noticeable with certain movements;    Objective:  Vitals:   05/10/22 0934  BP: 140/80  Pulse: 78  Resp: 16  Temp: (!) 97.5 F (36.4 C)  TempSrc: Oral  SpO2: 98%  Weight: 208 lb 12.8 oz (94.7 kg)  Height: '5\' 9"'$  (1.753 m)    General: Well developed, well nourished, in no acute distress  Skin : Warm and dry.  Head: Normocephalic and atraumatic  Eyes: Sclera and conjunctiva clear; pupils round and reactive to light; extraocular movements intact  Ears: External normal; canals clear; tympanic membranes normal  Oropharynx: Pink, supple. No suspicious lesions  Neck: Supple without thyromegaly, adenopathy  Lungs: Respirations unlabored;  Neurologic: Alert and oriented; speech intact; face symmetrical; moves all extremities well; CNII-XII intact without focal deficit   Assessment:  1. Facial pain   2. Viral URI with cough     Plan:  Will update facial X-ray today; trial of Flonase; to consider referral to ENT;   Return for CPE due after 08/04/22.  Orders Placed This Encounter  Procedures   DG Facial Bones 1-2 Views    Order Specific Question:   Reason for Exam (SYMPTOM  OR DIAGNOSIS REQUIRED)    Answer:   right sided facial pain    Order Specific Question:   Preferred imaging location?    Answer:   [facility]    Requested Prescriptions   Signed Prescriptions Disp Refills   fluticasone (FLONASE) 50 MCG/ACT nasal spray 48 mL 0    Sig: SPRAY 2 SPRAYS INTO EACH NOSTRIL EVERY DAY

## 2022-05-27 ENCOUNTER — Other Ambulatory Visit: Payer: Self-pay | Admitting: Cardiovascular Disease

## 2022-05-29 ENCOUNTER — Ambulatory Visit (INDEPENDENT_AMBULATORY_CARE_PROVIDER_SITE_OTHER): Payer: PPO

## 2022-05-29 ENCOUNTER — Telehealth: Payer: Self-pay

## 2022-05-29 DIAGNOSIS — I442 Atrioventricular block, complete: Secondary | ICD-10-CM | POA: Diagnosis not present

## 2022-05-29 LAB — CUP PACEART REMOTE DEVICE CHECK
Battery Remaining Longevity: 9 mo
Battery Remaining Percentage: 7 %
Battery Voltage: 2.78 V
Brady Statistic RV Percent Paced: 64 %
Date Time Interrogation Session: 20230816022624
Implantable Lead Implant Date: 20130419
Implantable Lead Location: 753860
Implantable Pulse Generator Implant Date: 20130419
Lead Channel Impedance Value: 450 Ohm
Lead Channel Pacing Threshold Amplitude: 0.75 V
Lead Channel Pacing Threshold Pulse Width: 0.5 ms
Lead Channel Sensing Intrinsic Amplitude: 12 mV
Lead Channel Setting Pacing Amplitude: 2.5 V
Lead Channel Setting Pacing Pulse Width: 0.5 ms
Lead Channel Setting Sensing Sensitivity: 2.5 mV
Pulse Gen Model: 1210
Pulse Gen Serial Number: 7313697

## 2022-05-29 NOTE — Telephone Encounter (Signed)
Pt states that he would like to be referred to ENT due the he is still having the same problems.

## 2022-05-30 ENCOUNTER — Other Ambulatory Visit: Payer: Self-pay | Admitting: Family

## 2022-05-30 DIAGNOSIS — R519 Headache, unspecified: Secondary | ICD-10-CM

## 2022-06-10 ENCOUNTER — Ambulatory Visit: Payer: PPO | Attending: Cardiovascular Disease

## 2022-06-10 DIAGNOSIS — I482 Chronic atrial fibrillation, unspecified: Secondary | ICD-10-CM | POA: Diagnosis not present

## 2022-06-10 DIAGNOSIS — Z7901 Long term (current) use of anticoagulants: Secondary | ICD-10-CM | POA: Diagnosis not present

## 2022-06-10 LAB — POCT INR: INR: 2.3 (ref 2.0–3.0)

## 2022-06-10 NOTE — Patient Instructions (Signed)
continue taking 1.5 tablets every day except 1 tablet Monday and Friday. Repeat INR in 8 weeks (normally 8 wks) .

## 2022-06-23 ENCOUNTER — Other Ambulatory Visit: Payer: Self-pay | Admitting: Cardiovascular Disease

## 2022-06-24 NOTE — Telephone Encounter (Signed)
Prescription refill request received for warfarin Lov: [place], 01/14/2022 Next INR check: 10/23 Warfarin tablet strength: 2.5 mg   Refill sent.

## 2022-06-27 ENCOUNTER — Other Ambulatory Visit: Payer: Self-pay | Admitting: Family

## 2022-06-27 NOTE — Progress Notes (Signed)
Remote pacemaker transmission.   

## 2022-07-15 ENCOUNTER — Telehealth: Payer: Self-pay | Admitting: Family

## 2022-07-15 NOTE — Telephone Encounter (Signed)
I have called the pt and informed him that he will need to be seen in order to get medication because we have to elevate him and his sx. He is now scheduled with Dr. Nani Ravens for tomorrow afternoon.

## 2022-07-15 NOTE — Telephone Encounter (Signed)
Pt called stating that he is having his reoccurring issue of cold, cough, congestion and was wondering if Mickel Baas could send in what she did the last time to help clear this up.

## 2022-07-16 ENCOUNTER — Encounter: Payer: Self-pay | Admitting: Family Medicine

## 2022-07-16 ENCOUNTER — Ambulatory Visit (INDEPENDENT_AMBULATORY_CARE_PROVIDER_SITE_OTHER): Payer: PPO | Admitting: Family Medicine

## 2022-07-16 VITALS — BP 128/80 | HR 86 | Temp 97.9°F | Ht 69.0 in | Wt 207.2 lb

## 2022-07-16 DIAGNOSIS — J069 Acute upper respiratory infection, unspecified: Secondary | ICD-10-CM | POA: Diagnosis not present

## 2022-07-16 LAB — POC COVID19 BINAXNOW

## 2022-07-16 MED ORDER — HYDROCODONE BIT-HOMATROP MBR 5-1.5 MG/5ML PO SOLN: 5.0000 mL | Freq: Three times a day (TID) | ORAL | 0 refills | Status: DC | PRN

## 2022-07-16 MED ORDER — METHYLPREDNISOLONE 4 MG PO TBPK: ORAL_TABLET | ORAL | 0 refills | Status: DC

## 2022-07-16 NOTE — Patient Instructions (Signed)
Continue to push fluids, practice good hand hygiene, and cover your mouth if you cough.  If you start having fevers, shaking or shortness of breath, seek immediate care.  OK to take Tylenol 1000 mg (2 extra strength tabs) or 975 mg (3 regular strength tabs) every 6 hours as needed.  Do not drink alcohol, do any illicit/street drugs, drive or do anything that requires alertness while on this cough medicine.  Send me a message Friday if no improvement.   Let us know if you need anything.

## 2022-07-16 NOTE — Addendum Note (Signed)
Addended by: Sharon Seller B on: 07/16/2022 02:23 PM   Modules accepted: Orders

## 2022-07-16 NOTE — Progress Notes (Signed)
Chief Complaint  Patient presents with   Cough    Congestion     [facility] here for URI complaints.  Duration: 2 days  Associated symptoms: sinus congestion, sore throat, and coughing Denies: sinus pain, rhinorrhea, itchy watery eyes, ear pain, ear drainage, wheezing, shortness of breath, myalgia, and fevers Treatment to date: Tylenol, Delsym, Dayquil Seems to happen several times a year, sometimes when the weather changes.  Sick contacts: No  Past Medical History:  Diagnosis Date   Atrial fibrillation ([place])    Cardiomyopathy, ischemic 11/07/2013   Cataract    bil cataracts removed   CHB (complete heart block) ([place]) 11/06/2013   Chronic combined systolic and diastolic CHF (congestive heart failure) (HCC)    Clotting disorder (HCC)    TIA , cva - PT ON COUMADINE   Colon polyps    Colon polyps    Congenital heart block    Constipation    CVA (cerebral infarction)    Diverticulosis    Esophageal stricture    Family history of adverse reaction to anesthesia    Heart murmur    Hyperlipidemia    Hypertension    Hyperthyroidism 11/16/2015   Lymphoma ([place]) 1998   of colon    Presence of permanent cardiac pacemaker    St. Jude  pt.states he is totally dependent on pacemaker   Shortness of breath dyspnea    Pulmonary Effusion   Stroke (HCC)    TIA, cva   Umbilical hernia    Urinary frequency     Objective BP 128/80 (BP Location: Left Arm, Patient Position: Sitting, Cuff Size: Normal)   Pulse 86   Temp 97.9 F (36.6 C) (Oral)   Ht '5\' 9"'$  (1.753 m)   Wt 207 lb 4 oz (94 kg)   SpO2 95%   BMI 30.61 kg/m  General: Awake, alert, appears stated age HEENT: AT, [place], ears patent b/l and TM's neg, nares patent w/o discharge, pharynx pink and without exudates, MMM Neck: No masses or asymmetry Heart: RRR Lungs: CTAB, no accessory muscle use Psych: Age appropriate judgment and insight, normal mood and affect  Viral URI with cough - Plan: methylPREDNISolone (MEDROL DOSEPAK) 4  MG TBPK tablet, HYDROcodone bit-homatropine (HYCODAN) 5-1.5 MG/5ML syrup  Medrol Dosepak as this reportedly works well as well as the above syrup. Warned about drowsiness. He has tolerated well in past.  Continue to push fluids, practice good hand hygiene, cover mouth when coughing. F/u prn. If starting to experience fevers, shaking, or shortness of breath, seek immediate care. Pt voiced understanding and agreement to the plan.  [place], DO 07/16/22 2:19 PM

## 2022-07-17 ENCOUNTER — Telehealth: Payer: Self-pay | Admitting: Family

## 2022-07-17 NOTE — Telephone Encounter (Signed)
Yes

## 2022-07-17 NOTE — Telephone Encounter (Signed)
Patient states he has heart failure and one of the medications given to him yesterday by Dr. Nani Ravens has a warning stating it might worsen heart failure. He would like to know if he should still take it. Please advise.   methylPREDNISolone (MEDROL DOSEPAK) 4 MG TBPK tablet

## 2022-07-17 NOTE — Telephone Encounter (Signed)
Called informed the patient of PCP instructions. 

## 2022-07-22 ENCOUNTER — Telehealth: Payer: Self-pay | Admitting: Family

## 2022-07-22 MED ORDER — CEFDINIR 300 MG PO CAPS: 300.0000 mg | ORAL_CAPSULE | Freq: Two times a day (BID) | ORAL | 0 refills | Status: AC

## 2022-07-22 NOTE — Telephone Encounter (Signed)
Called and informed sent in.

## 2022-07-22 NOTE — Telephone Encounter (Signed)
Patient called to advise that he is still not feeling better and Dr. Nani Ravens advised that he call back if he is not feeling better. Please call to advise next steps.

## 2022-07-22 NOTE — Telephone Encounter (Signed)
Another medicine has been sent.

## 2022-08-05 ENCOUNTER — Ambulatory Visit: Payer: PPO | Attending: Cardiovascular Disease

## 2022-08-05 DIAGNOSIS — I482 Chronic atrial fibrillation, unspecified: Secondary | ICD-10-CM

## 2022-08-05 DIAGNOSIS — Z5181 Encounter for therapeutic drug level monitoring: Secondary | ICD-10-CM

## 2022-08-05 LAB — POCT INR: INR: 2.3 (ref 2.0–3.0)

## 2022-08-05 NOTE — Patient Instructions (Signed)
continue taking 1.5 tablets every day except 1 tablet Monday and Friday. Repeat INR in 8 weeks (normally 8 wks) .

## 2022-08-06 ENCOUNTER — Encounter: Payer: Self-pay | Admitting: Family

## 2022-08-06 ENCOUNTER — Ambulatory Visit (INDEPENDENT_AMBULATORY_CARE_PROVIDER_SITE_OTHER): Payer: PPO | Admitting: Family

## 2022-08-06 VITALS — BP 144/80 | HR 65 | Temp 97.6°F | Ht 69.0 in | Wt 208.6 lb

## 2022-08-06 DIAGNOSIS — Z125 Encounter for screening for malignant neoplasm of prostate: Secondary | ICD-10-CM

## 2022-08-06 DIAGNOSIS — J069 Acute upper respiratory infection, unspecified: Secondary | ICD-10-CM | POA: Diagnosis not present

## 2022-08-06 DIAGNOSIS — Z23 Encounter for immunization: Secondary | ICD-10-CM

## 2022-08-06 DIAGNOSIS — Z0000 Encounter for general adult medical examination without abnormal findings: Secondary | ICD-10-CM

## 2022-08-06 DIAGNOSIS — Z13220 Encounter for screening for lipoid disorders: Secondary | ICD-10-CM

## 2022-08-06 LAB — COMPREHENSIVE METABOLIC PANEL
ALT: 15 U/L (ref 0–53)
AST: 20 U/L (ref 0–37)
Albumin: 4 g/dL (ref 3.5–5.2)
Alkaline Phosphatase: 83 U/L (ref 39–117)
BUN: 15 mg/dL (ref 6–23)
CO2: 31 mEq/L (ref 19–32)
Calcium: 8.7 mg/dL (ref 8.4–10.5)
Chloride: 100 mEq/L (ref 96–112)
Creatinine, Ser: 1.1 mg/dL (ref 0.40–1.50)
GFR: 65.16 mL/min (ref >60.00)
Glucose, Bld: 93 mg/dL (ref 70–99)
Potassium: 4.5 mEq/L (ref 3.5–5.1)
Sodium: 136 mEq/L (ref 135–145)
Total Bilirubin: 1.5 mg/dL — ABNORMAL HIGH (ref 0.2–1.2)
Total Protein: 7 g/dL (ref 6.0–8.3)

## 2022-08-06 LAB — CBC WITH DIFFERENTIAL/PLATELET
Basophils Absolute: 0 10*3/uL (ref 0.0–0.1)
Basophils Relative: 0.6 % (ref 0.0–3.0)
Eosinophils Absolute: 0.2 10*3/uL (ref 0.0–0.7)
Eosinophils Relative: 3.3 % (ref 0.0–5.0)
HCT: 44.8 % (ref 39.0–52.0)
Hemoglobin: 15 g/dL (ref 13.0–17.0)
Lymphocytes Relative: 24.1 % (ref 12.0–46.0)
Lymphs Abs: 1.7 10*3/uL (ref 0.7–4.0)
MCHC: 33.4 g/dL (ref 30.0–36.0)
MCV: 98.4 fl (ref 78.0–100.0)
Monocytes Absolute: 0.5 10*3/uL (ref 0.1–1.0)
Monocytes Relative: 6.6 % (ref 3.0–12.0)
Neutro Abs: 4.5 10*3/uL (ref 1.4–7.7)
Neutrophils Relative %: 65.4 % (ref 43.0–77.0)
Platelets: 183 10*3/uL (ref 150.0–400.0)
RBC: 4.56 Mil/uL (ref 4.22–5.81)
RDW: 13.9 % (ref 11.5–15.5)
WBC: 6.9 10*3/uL (ref 4.0–10.5)

## 2022-08-06 LAB — LIPID PANEL
Cholesterol: 130 mg/dL (ref 0–200)
HDL: 42.3 mg/dL (ref >39.00)
LDL Cholesterol: 72 mg/dL (ref 0–99)
NonHDL: 87.81
Total CHOL/HDL Ratio: 3
Triglycerides: 77 mg/dL (ref 0.0–149.0)
VLDL: 15.4 mg/dL (ref 0.0–40.0)

## 2022-08-06 LAB — PSA: PSA: 0.69 ng/mL (ref 0.10–4.00)

## 2022-08-06 MED ORDER — HYDROCODONE BIT-HOMATROP MBR 5-1.5 MG/5ML PO SOLN: 5.0000 mL | Freq: Three times a day (TID) | ORAL | 0 refills | Status: DC | PRN

## 2022-08-06 NOTE — Patient Instructions (Signed)
Please consider getting your Tdap and Shingles vaccine updated at your pharamcy; we cannot do these in our office per Medicare guidelines;

## 2022-08-06 NOTE — Progress Notes (Signed)
Benjamin Mckay is a 76 y.o. male with the following history as recorded in EpicCare:  Patient Active Problem List   Diagnosis Date Noted   Medicare annual wellness visit, subsequent 07/21/2017   Routine adult health maintenance 07/21/2017   Pleural effusion, malignant 10/02/2016   Rotator cuff tendinitis 09/10/2016   Ventricular tachycardia ([place]) 06/12/2016   Amiodarone-induced hyperthyroidism 03/09/2016   Insomnia 03/08/2016   Solitary pulmonary nodule 01/09/2016   Hypercholesterolemia 12/12/2015   History of sustained ventricular tachycardia 01/30/2015   Chronic combined systolic (congestive) and diastolic (congestive) heart failure ([place]) 12/18/2014   Warfarin anticoagulation 12/08/2014   ARF (acute renal failure) ([place]) 12/07/2014   Cardiomyopathy, ischemic 11/07/2013   CHB (complete heart block) ([place]) 11/06/2013   Single chamber St. Jude pacemaker 2013 11/06/2013   Chronic atrial fibrillation ([place]) 01/29/2013   Long term (current) use of anticoagulants 01/29/2013   HTN (hypertension) 02/01/2012   Carotid stenosis 02/01/2012   Renal artery stenosis ([place]) 02/01/2012   Coronary artery disease involving native coronary artery of native heart with angina pectoris ([place]) 02/01/2012   Esophageal reflux 08/21/2011   Hemorrhage of rectum and anus 49/20/1007   HERNIA, UMBILICAL 10/02/7587   UNSPECIFIED CARDIAC DYSRHYTHMIA 11/11/2007   Transient cerebral ischemia 11/11/2007   ESOPHAGEAL STRICTURE 11/11/2007   DIVERTICULOSIS OF COLON 11/11/2007   Non-Hodgkin's lymphoma of intestine ([employer]) 09/03/2007    Current Outpatient Medications  Medication Sig Dispense Refill   acetaminophen (TYLENOL) 500 MG tablet Take 1,000 mg by mouth every 6 (six) hours as needed for moderate pain.      Evolocumab (REPATHA SURECLICK) 325 MG/ML SOAJ Inject 140 mg into the skin every 14 (fourteen) days. 2 mL 11   fluticasone (FLONASE) 50 MCG/ACT nasal spray SPRAY 2 SPRAYS INTO EACH NOSTRIL EVERY DAY 48 mL 0    furosemide (LASIX) 20 MG tablet TAKE 1 TO 2 TABLETS BY MOUTH DAILY AS DIRECTED 180 tablet 3   metoprolol succinate (TOPROL-XL) 25 MG 24 hr tablet TAKE 1 TABLET BY MOUTH TWICE A DAY 180 tablet 3   pravastatin (PRAVACHOL) 80 MG tablet TAKE 1 TABLET BY MOUTH EVERY DAY 90 tablet 3   tamsulosin (FLOMAX) 0.4 MG CAPS capsule Take 0.4 mg by mouth at bedtime.     traZODone (DESYREL) 50 MG tablet TAKE 1/2 TO 1 TABLET BY MOUTH AT BEDTIME AS NEEDED FOR SLEEP 90 tablet 1   warfarin (COUMADIN) 2.5 MG tablet TAKE 1 TO 1.5 TABLETS BY MOUTH DAILY AS DIRECTED BY THE COUMADIN CLINIC 135 tablet 0   HYDROcodone bit-homatropine (HYCODAN) 5-1.5 MG/5ML syrup Take 5 mLs by mouth every 8 (eight) hours as needed for cough. 120 mL 0   No current facility-administered medications for this visit.    Allergies: Statins, Adhesive [tape], Latex, and Penicillins  Past Medical History:  Diagnosis Date   Atrial fibrillation ([place])    Cardiomyopathy, ischemic 11/07/2013   Cataract    bil cataracts removed   CHB (complete heart block) ([place]) 11/06/2013   Chronic combined systolic and diastolic CHF (congestive heart failure) (HCC)    Clotting disorder (HCC)    TIA , cva - PT ON COUMADINE   Colon polyps    Colon polyps    Congenital heart block    Constipation    CVA (cerebral infarction)    Diverticulosis    Esophageal stricture    Family history of adverse reaction to anesthesia    Heart murmur    Hyperlipidemia    Hypertension    Hyperthyroidism 11/16/2015  Lymphoma ([place]) 1998   of colon    Presence of permanent cardiac pacemaker    St. Jude  pt.states he is totally dependent on pacemaker   Shortness of breath dyspnea    Pulmonary Effusion   Stroke ([place])    TIA, cva   Umbilical hernia    Urinary frequency     Past Surgical History:  Procedure Laterality Date   APPENDECTOMY  1953   CARDIAC CATHETERIZATION  01/06/2003   Recommend medical therapy   CARDIOVASCULAR STRESS TEST  07/16/2012   Mild-moderate perfusion  defect seen in Basal inferior, Mid inferior, and Apica lateral consistent with infarct/scar. No scintigraphic evidence for inducible myocardial ischemia. No ECG changes. EKG negative for ischemia.   CAROTID DOPPLER  11/04/2012   Proximal Rt ICA 50-99% diameter reduction; Lft Bulb demonstrated mild amount homogeneous plaque-not hemodynamically significant; Lft ICA-normal patency.   CHEST TUBE INSERTION Right 03/15/2016   Procedure: INSERTION PLEURAL DRAINAGE CATHETER;  Surgeon: Ivin Poot, MD;  Location: [facility];  Service: Thoracic;  Laterality: Right;   COLECTOMY     for lymphoma   COLONOSCOPY     neck fusion     PACEMAKER GENERATOR CHANGE  01/31/2012   St Jude Med Accent DR RF model [id number] serial [id number]   PACEMAKER PLACEMENT     replaced 3 x   PERMANENT PACEMAKER GENERATOR CHANGE N/A 01/31/2012   Procedure: PERMANENT PACEMAKER GENERATOR CHANGE;  Surgeon: Sanda Klein, MD;  Location: [facility] CATH LAB;    PLEURADESIS N/A 04/02/2016   Procedure: Antionette Poles;  Surgeon: Ivin Poot, MD;  Location: [facility];  Service: Thoracic;  Laterality: N/A;   REMOVAL OF PLEURAL DRAINAGE CATHETER Right 07/26/2016   Procedure: REMOVAL OF PLEURAL DRAINAGE CATHETER;  Surgeon: Ivin Poot, MD;  [facility];  Service: Thoracic;  Laterality: Right;   TONSILLECTOMY     TRANSTHORACIC ECHOCARDIOGRAM  11/04/2012   EF 16-96%, systolic function moderately reduced, mild regurg of the aortic and mitral valves.   VIDEO ASSISTED THORACOSCOPY Right 04/02/2016   Procedure: Right VIDEO ASSISTED THORACOSCOPY with Biopsies and drainage pleural effusion;  Surgeon: Ivin Poot, MD;  Location: [facility] OR;  Service: Thoracic;  Laterality: Right;    Family History  Problem Relation Age of Onset   Prostate cancer Father    Heart disease Mother    Stroke Mother    Diabetes Maternal Grandmother    Colon cancer Neg Hx    Heart attack Neg Hx    Esophageal cancer Neg Hx    Stomach cancer Neg Hx    Rectal cancer Neg Hx      Social History   Tobacco Use   Smoking status: Never   Smokeless tobacco: Never  Substance Use Topics   Alcohol use: No    Alcohol/week: 0.0 standard drinks of alcohol    Subjective:   Patient presents for yearly CPE; no acute concerns today; does see dentist regularly; will be seeing ENT in follow up later in November;  Complaining of residual cough after recent treatment for acute sinus infection; wonders about short term refill on cough syrup that has helped in the past;   Review of Systems  Constitutional: Negative.   HENT: Negative.    Eyes: Negative.   Respiratory: Negative.    Cardiovascular: Negative.   Gastrointestinal: Negative.   Genitourinary: Negative.   Musculoskeletal: Negative.   Skin: Negative.   Neurological: Negative.   Endo/Heme/Allergies: Negative.   Psychiatric/Behavioral: Negative.  Objective:  Vitals:   08/06/22 1007 08/06/22 1219  BP: (!) 150/80 (!) 144/80  Pulse: 65   Temp: 97.6 F (36.4 C)   TempSrc: Oral   SpO2: 97%   Weight: 208 lb 9.6 oz (94.6 kg)   Height: _0  (1.753 m)     General: Well developed, well nourished, in no acute distress  Skin : Warm and dry.  Head: Normocephalic and atraumatic  Eyes: Sclera and conjunctiva clear; pupils round and reactive to light; extraocular movements intact  Ears: External normal; canals clear; tympanic membranes normal  Oropharynx: Pink, supple. No suspicious lesions  Neck: Supple without thyromegaly, adenopathy  Lungs: Respirations unlabored; clear to auscultation bilaterally without wheeze, rales, rhonchi  CVS exam: normal rate and regular rhythm.  Abdomen: Soft; nontender; nondistended; normoactive bowel sounds; no masses or hepatosplenomegaly  Musculoskeletal: No deformities; no active joint inflammation  Extremities: No edema, cyanosis, clubbing  Vessels: Symmetric bilaterally  Neurologic: Alert and oriented; speech intact; face symmetrical; moves all extremities well;  CNII-XII intact without focal deficit  Assessment:  1. PE (physical exam), annual   2. Lipid screening   3. Prostate cancer screening   4. Viral URI with cough   5. Need for immunization against influenza     Plan:  Age appropriate preventive healthcare needs addressed; encouraged regular eye doctor and dental exams; encouraged regular exercise; will update labs and refills as needed today; follow-up to be determined; Flu shot updated; Continue with cardiology for close monitoring as scheduled;   No follow-ups on file.  Orders Placed This Encounter  Procedures   Flu Vaccine QUAD High Dose(Fluad)   CBC with Differential/Platelet   Comp Met (CMET)   Lipid panel   PSA    Requested Prescriptions   Signed Prescriptions Disp Refills   HYDROcodone bit-homatropine (HYCODAN) 5-1.5 MG/5ML syrup 120 mL 0    Sig: Take 5 mLs by mouth every 8 (eight) hours as needed for cough.

## 2022-08-15 ENCOUNTER — Encounter: Payer: Self-pay | Admitting: Family

## 2022-08-28 ENCOUNTER — Telehealth: Payer: Self-pay

## 2022-08-28 ENCOUNTER — Ambulatory Visit: Payer: PPO | Attending: Cardiovascular Disease

## 2022-08-28 DIAGNOSIS — I442 Atrioventricular block, complete: Secondary | ICD-10-CM | POA: Diagnosis not present

## 2022-08-28 DIAGNOSIS — Z950 Presence of cardiac pacemaker: Secondary | ICD-10-CM | POA: Diagnosis not present

## 2022-08-28 LAB — CUP PACEART REMOTE DEVICE CHECK
Battery Remaining Longevity: 6 mo
Battery Remaining Percentage: 5 %
Battery Voltage: 2.74 V
Brady Statistic RV Percent Paced: 63 %
Date Time Interrogation Session: 20231115055745
Implantable Lead Connection Status: 753985
Implantable Lead Implant Date: 20130419
Implantable Lead Location: 753860
Implantable Pulse Generator Implant Date: 20130419
Lead Channel Impedance Value: 430 Ohm
Lead Channel Pacing Threshold Amplitude: 0.75 V
Lead Channel Pacing Threshold Pulse Width: 0.5 ms
Lead Channel Sensing Intrinsic Amplitude: 12 mV
Lead Channel Setting Pacing Amplitude: 2.5 V
Lead Channel Setting Pacing Pulse Width: 0.5 ms
Lead Channel Setting Sensing Sensitivity: 2.5 mV
Pulse Gen Model: 1210
Pulse Gen Serial Number: 7313697

## 2022-08-28 NOTE — Telephone Encounter (Signed)
Called patient to advise he has about 6.1 months left on his battery and we will schedule him to check his device one a month. Patient voiced understanding.

## 2022-09-09 ENCOUNTER — Telehealth (INDEPENDENT_AMBULATORY_CARE_PROVIDER_SITE_OTHER): Payer: PPO | Admitting: Family Medicine

## 2022-09-09 ENCOUNTER — Encounter: Payer: Self-pay | Admitting: Family Medicine

## 2022-09-09 DIAGNOSIS — B9689 Other specified bacterial agents as the cause of diseases classified elsewhere: Secondary | ICD-10-CM | POA: Diagnosis not present

## 2022-09-09 DIAGNOSIS — J208 Acute bronchitis due to other specified organisms: Secondary | ICD-10-CM | POA: Diagnosis not present

## 2022-09-09 MED ORDER — PROMETHAZINE-DM 6.25-15 MG/5ML PO SYRP: 5.0000 mL | ORAL_SOLUTION | Freq: Four times a day (QID) | ORAL | 0 refills | Status: DC | PRN

## 2022-09-09 MED ORDER — DOXYCYCLINE HYCLATE 100 MG PO TABS: 100.0000 mg | ORAL_TABLET | Freq: Two times a day (BID) | ORAL | 0 refills | Status: DC

## 2022-09-09 NOTE — Progress Notes (Signed)
Chief Complaint  Patient presents with   Cough    Fever Chills Shortness of breath when moving around    Benjamin Mckay here for URI complaints. Due to COVID-19 pandemic, we are interacting via web portal for an electronic face-to-face visit. I verified patient's ID using 2 identifiers. Patient agreed to proceed with visit via this method. Patient is at home, I am at office. Patient and I are present for visit.   Duration: 1 day  Associated symptoms: Fever (102 F max), sinus congestion, rhinorrhea, shortness of breath, and productive cough Denies: sinus pain, itchy watery eyes, ear pain, ear drainage, sore throat, wheezing, myalgia, and N/V/D Treatment to date: Tylenol, Delsym Sick contacts: No; came back from cruise  Past Medical History:  Diagnosis Date   Atrial fibrillation ([place])    Cardiomyopathy, ischemic 11/07/2013   Cataract    bil cataracts removed   CHB (complete heart block) ([place]) 11/06/2013   Chronic combined systolic and diastolic CHF (congestive heart failure) (HCC)    Clotting disorder (HCC)    TIA , cva - PT ON COUMADINE   Colon polyps    Colon polyps    Congenital heart block    Constipation    CVA (cerebral infarction)    Diverticulosis    Esophageal stricture    Family history of adverse reaction to anesthesia    Heart murmur    Hyperlipidemia    Hypertension    Hyperthyroidism 11/16/2015   Lymphoma ([place]) 1998   of colon    Presence of permanent cardiac pacemaker    St. Jude  pt.states he is totally dependent on pacemaker   Shortness of breath dyspnea    Pulmonary Effusion   Stroke ([place])    TIA, cva   Umbilical hernia    Urinary frequency     Objective No conversational dyspnea Age appropriate judgment and insight Nml affect and mood  Acute bacterial bronchitis - Plan: promethazine-dextromethorphan (PROMETHAZINE-DM) 6.25-15 MG/5ML syrup, doxycycline (VIBRA-TABS) 100 MG tablet  Given fevers with productive cough, will treat for  pneumonia/bacterial bronchitis with 7 days of doxycycline.  Syrup as above.  Warned about drowsiness.  He will also test for COVID at home.  He will let me know if he tests positive. Continue to push fluids, practice good hand hygiene, cover mouth when coughing. F/u prn. If starting to experience worsening symptoms, shaking, or shortness of breath, seek immediate care. Pt voiced understanding and agreement to the plan.  [place], DO 09/09/22 11:08 AM

## 2022-09-20 NOTE — Progress Notes (Signed)
Remote pacemaker transmission.   

## 2022-09-26 ENCOUNTER — Ambulatory Visit (INDEPENDENT_AMBULATORY_CARE_PROVIDER_SITE_OTHER): Payer: PPO | Admitting: Family

## 2022-09-26 ENCOUNTER — Ambulatory Visit (HOSPITAL_BASED_OUTPATIENT_CLINIC_OR_DEPARTMENT_OTHER)
Admission: RE | Admit: 2022-09-26 | Discharge: 2022-09-26 | Disposition: A | Discharge status: Home / Self Care | Payer: PPO | Source: Ambulatory Visit | Attending: Family | Admitting: Family

## 2022-09-26 ENCOUNTER — Encounter: Payer: Self-pay | Admitting: Family

## 2022-09-26 VITALS — BP 146/60 | HR 67 | Temp 97.6°F | Ht 69.0 in | Wt 209.8 lb

## 2022-09-26 DIAGNOSIS — R109 Unspecified abdominal pain: Secondary | ICD-10-CM

## 2022-09-26 DIAGNOSIS — R82998 Other abnormal findings in urine: Secondary | ICD-10-CM | POA: Diagnosis not present

## 2022-09-26 LAB — POC URINALSYSI DIPSTICK (AUTOMATED)
Bilirubin, UA: NEGATIVE
Glucose, UA: NEGATIVE
Ketones, UA: NEGATIVE
Leukocytes, UA: NEGATIVE
Nitrite, UA: NEGATIVE
Protein, UA: NEGATIVE
Spec Grav, UA: 1.015 (ref 1.010–1.025)
Urobilinogen, UA: 0.2 E.U./dL
pH, UA: 5 (ref 5.0–8.0)

## 2022-09-26 MED ORDER — DOXYCYCLINE HYCLATE 100 MG PO TABS: 100.0000 mg | ORAL_TABLET | Freq: Two times a day (BID) | ORAL | 0 refills | Status: AC

## 2022-09-26 MED ORDER — IOHEXOL 300 MG/ML  SOLN
100.0000 mL | Freq: Once | INTRAMUSCULAR | Status: AC | PRN
  Administered 2022-09-26: 75 mL via INTRAVENOUS

## 2022-09-26 NOTE — Progress Notes (Signed)
Benjamin Mckay is a 76 y.o. 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 Jude pacemaker 2013 11/06/2013   Chronic atrial fibrillation ([place]) 01/29/2013   Long term (current) use of anticoagulants 01/29/2013   HTN (hypertension) 02/01/2012   Carotid stenosis 02/01/2012   Renal artery stenosis ([place]) 02/01/2012   Coronary artery disease involving native coronary artery of native heart with angina pectoris ([place]) 02/01/2012   Esophageal reflux 08/21/2011   Hemorrhage of rectum and anus 29/56/2130   HERNIA, UMBILICAL 86/57/8469   UNSPECIFIED CARDIAC DYSRHYTHMIA 11/11/2007   Transient cerebral ischemia 11/11/2007   ESOPHAGEAL STRICTURE 11/11/2007   DIVERTICULOSIS OF COLON 11/11/2007   Non-Hodgkin's lymphoma of intestine ([place]) 09/03/2007    Current Outpatient Medications  Medication Sig Dispense Refill   doxycycline (VIBRA-TABS) 100 MG tablet Take 1 tablet (100 mg total) by mouth 2 (two) times daily for 10 days. 20 tablet 0   acetaminophen (TYLENOL) 500 MG tablet Take 1,000 mg by mouth every 6 (six) hours as needed for moderate pain.      Evolocumab (REPATHA SURECLICK) 629 MG/ML SOAJ Inject 140 mg into the skin every 14  (fourteen) days. 2 mL 11   fluticasone (FLONASE) 50 MCG/ACT nasal spray SPRAY 2 SPRAYS INTO EACH NOSTRIL EVERY DAY 48 mL 0   furosemide (LASIX) 20 MG tablet TAKE 1 TO 2 TABLETS BY MOUTH DAILY AS DIRECTED 180 tablet 3   metoprolol succinate (TOPROL-XL) 25 MG 24 hr tablet TAKE 1 TABLET BY MOUTH TWICE A DAY 180 tablet 3   pravastatin (PRAVACHOL) 80 MG tablet TAKE 1 TABLET BY MOUTH EVERY DAY 90 tablet 3   tamsulosin (FLOMAX) 0.4 MG CAPS capsule Take 0.4 mg by mouth at bedtime.     traZODone (DESYREL) 50 MG tablet TAKE 1/2 TO 1 TABLET BY MOUTH AT BEDTIME AS NEEDED FOR SLEEP 90 tablet 1   warfarin (COUMADIN) 2.5 MG tablet TAKE 1 TO 1.5 TABLETS BY MOUTH DAILY AS DIRECTED BY THE COUMADIN CLINIC 135 tablet 0   No current facility-administered medications for this visit.    Allergies: Statins, Adhesive [tape], Latex, and Penicillins  Past Medical History:  Diagnosis Date   Atrial fibrillation ([place])    Cardiomyopathy, ischemic 11/07/2013   Cataract    bil cataracts removed   CHB (complete heart block) ([place]) 11/06/2013   Chronic combined systolic and diastolic CHF (congestive heart failure) ([facility])    Clotting disorder ([facility])    TIA , cva - PT ON COUMADINE   Colon polyps    Colon polyps    Congenital heart block    Constipation    CVA (cerebral infarction)    Diverticulosis    Esophageal stricture    Family history of adverse reaction to anesthesia    Heart murmur    Hyperlipidemia    Hypertension    Hyperthyroidism 11/16/2015  Lymphoma ([place]) 1998   of colon    Presence of permanent cardiac pacemaker    St. Jude  pt.states he is totally dependent on pacemaker   Shortness of breath dyspnea    Pulmonary Effusion   Stroke ([place])    TIA, cva   Umbilical hernia    Urinary frequency     Past Surgical History:  Procedure Laterality Date   APPENDECTOMY  1953   CARDIAC CATHETERIZATION  01/06/2003   Recommend medical therapy   CARDIOVASCULAR STRESS TEST  07/16/2012   Mild-moderate perfusion  defect seen in Basal inferior, Mid inferior, and Apica lateral consistent with infarct/scar. No scintigraphic evidence for inducible myocardial ischemia. No ECG changes. EKG negative for ischemia.   CAROTID DOPPLER  11/04/2012   Proximal Rt ICA 50-99% diameter reduction; Lft Bulb demonstrated mild amount homogeneous plaque-not hemodynamically significant; Lft ICA-normal patency.   CHEST TUBE INSERTION Right 03/15/2016   Procedure: INSERTION PLEURAL DRAINAGE CATHETER;  Surgeon: Ivin Poot, MD;  Location: [facility];  Service: Thoracic;  Laterality: Right;   COLECTOMY     for lymphoma   COLONOSCOPY     neck fusion     PACEMAKER GENERATOR CHANGE  01/31/2012   St Jude Med Accent DR RF model [id number] serial [id number]   PACEMAKER PLACEMENT     replaced 3 x   PERMANENT PACEMAKER GENERATOR CHANGE N/A 01/31/2012   Procedure: PERMANENT PACEMAKER GENERATOR CHANGE;  Surgeon: Sanda Klein, MD;  Location: [facility];    PLEURADESIS N/A 04/02/2016   Procedure: Antionette Poles;  Surgeon: Ivin Poot, MD;  Location: [facility];  Service: Thoracic;  Laterality: N/A;   REMOVAL OF PLEURAL DRAINAGE CATHETER Right 07/26/2016   Procedure: REMOVAL OF PLEURAL DRAINAGE CATHETER;  Surgeon: Ivin Poot, MD;  Location: [facility];  Service: Thoracic;  Laterality: Right;   TONSILLECTOMY     TRANSTHORACIC ECHOCARDIOGRAM  11/04/2012   EF 76-28%, systolic function moderately reduced, mild regurg of the aortic and mitral valves.   VIDEO ASSISTED THORACOSCOPY Right 04/02/2016   Procedure: Right VIDEO ASSISTED THORACOSCOPY with Biopsies and drainage pleural effusion;  Surgeon: Ivin Poot, MD;  Location: [facility] OR;  Service: Thoracic;  Laterality: Right;    Family History  Problem Relation Age of Onset   Prostate cancer Father    Heart disease Mother    Stroke Mother    Diabetes Maternal Grandmother    Colon cancer Neg Hx    Heart attack Neg Hx    Esophageal cancer Neg Hx    Stomach cancer Neg Hx    Rectal cancer Neg Hx      Social History   Tobacco Use   Smoking status: Never   Smokeless tobacco: Never  Substance Use Topics   Alcohol use: No    Alcohol/week: 0.0 standard drinks of alcohol    Subjective:  1 week of constipation- marked change for bowel habits; denies any abdominal pain; no vomiting or nausea; became concerned when noticed urine was extremely dark urine- notes looked brown and then seemed to normalize as the day progressed; no flank pain;   Objective:  Vitals:   09/26/22 1040  BP: (!) 146/60  Pulse: 67  Temp: 97.6 F (36.4 C)  TempSrc: Oral  SpO2: 97%  Weight: 209 lb 12.8 oz (95.2 kg)  Height: '5\' 9"'$  (1.753 m)    General: Well developed, well nourished, in no acute distress  Skin : Warm and dry.  Head: Normocephalic and atraumatic  Eyes: Sclera and conjunctiva clear;  pupils round and reactive to light; extraocular movements intact  Ears: External normal; canals clear; tympanic membranes normal  Oropharynx: Pink, supple. No suspicious lesions  Neck: Supple without thyromegaly, adenopathy  Lungs: Respirations unlabored; clear to auscultation bilaterally without wheeze, rales, rhonchi  CVS exam: normal rate and regular rhythm.  Abdomen: Soft; nontender; nondistended; normoactive bowel sounds; no masses or hepatosplenomegaly  Neurologic: Alert and oriented; speech intact; face symmetrical; moves all extremities well; CNII-XII intact without focal deficit  Assessment:  1. Abdominal pain, unspecified abdominal location   2. Dark urine     Plan:  Update U/A- does show blood; concern for possible bowel obstruction; STAT abd/ pelvic CT ordered- patient to go to [facility] today at 1:30; follow up to be determined;  CT did not show obstruction- reviewed results with patient; he is aware of hernia; will treat for prostatitis with Doxycycline and increase Flomax to 2 at night; will check on patient on Monday of next week; strict Er precautions discussed for upcoming weekend.   No  follow-ups on file.  Orders Placed This Encounter  Procedures   CT Abdomen Pelvis W Contrast    Standing Status:   Future    Number of Occurrences:   1    Standing Expiration Date:   09/27/2023    Order Specific Question:   If indicated for the ordered procedure, I authorize the administration of contrast media per Radiology protocol    Answer:   Yes    Order Specific Question:   Does the patient have a contrast media/X-ray dye allergy?    Answer:   No    Order Specific Question:   Preferred imaging location?    Answer:   [facility] Specific Question:   Is Oral Contrast requested for this exam?    Answer:   Yes, Per Radiology protocol   CT Abdomen Pelvis W Contrast    Standing Status:   Future    Standing Expiration Date:   09/27/2023    Order Specific Question:   If indicated for the ordered procedure, I authorize the administration of contrast media per Radiology protocol    Answer:   Yes    Order Specific Question:   Does the patient have a contrast media/X-ray dye allergy?    Answer:   Yes    Order Specific Question:   Preferred imaging location?    Answer:   [facility]    Order Specific Question:   Release to patient    Answer:   Immediate [1]    Order Specific Question:   Is Oral Contrast requested for this exam?    Answer:   Yes, Per Radiology protocol   POCT Urinalysis Dipstick (Automated)    Requested Prescriptions   Signed Prescriptions Disp Refills   doxycycline (VIBRA-TABS) 100 MG tablet 20 tablet 0    Sig: Take 1 tablet (100 mg total) by mouth 2 (two) times daily for 10 days.

## 2022-09-26 NOTE — Progress Notes (Signed)
Reviewed CT results with patient- he is aware of umbilical hernia and notes this has been evaluated by surgeon in the past; will continue to monitor for now. No bowel obstruction noted; he can use Mag Citrate for immediate relief of constipation. Discussed that based on size of prostate and appearance of urine/ blood noted in urine, will treat for prostatitis. Antibiotic will be called in and he is to increase his Flomax to 2 tablets per night for the next 7-10 days;  Strict ER precautions discussed with upcoming weekend; will plan to call and check on patient on Monday.

## 2022-09-30 ENCOUNTER — Ambulatory Visit (HOSPITAL_BASED_OUTPATIENT_CLINIC_OR_DEPARTMENT_OTHER): Payer: PPO

## 2022-09-30 ENCOUNTER — Ambulatory Visit: Payer: PPO | Attending: Cardiology

## 2022-09-30 ENCOUNTER — Ambulatory Visit (INDEPENDENT_AMBULATORY_CARE_PROVIDER_SITE_OTHER): Payer: PPO

## 2022-09-30 DIAGNOSIS — I482 Chronic atrial fibrillation, unspecified: Secondary | ICD-10-CM | POA: Diagnosis not present

## 2022-09-30 DIAGNOSIS — Z5181 Encounter for therapeutic drug level monitoring: Secondary | ICD-10-CM | POA: Diagnosis not present

## 2022-09-30 DIAGNOSIS — I442 Atrioventricular block, complete: Secondary | ICD-10-CM

## 2022-09-30 DIAGNOSIS — Z7901 Long term (current) use of anticoagulants: Secondary | ICD-10-CM | POA: Diagnosis not present

## 2022-09-30 LAB — POCT INR: INR: 2.3 (ref 2.0–3.0)

## 2022-09-30 NOTE — Patient Instructions (Signed)
continue taking 1.5 tablets every day except 1 tablet Monday and Friday. Repeat INR in 2 weeks (normally 8 wks) .

## 2022-10-01 ENCOUNTER — Other Ambulatory Visit: Payer: Self-pay | Admitting: Cardiovascular Disease

## 2022-10-01 ENCOUNTER — Telehealth: Payer: Self-pay | Admitting: Family

## 2022-10-01 DIAGNOSIS — I6523 Occlusion and stenosis of bilateral carotid arteries: Secondary | ICD-10-CM

## 2022-10-01 DIAGNOSIS — I25119 Atherosclerotic heart disease of native coronary artery with unspecified angina pectoris: Secondary | ICD-10-CM

## 2022-10-01 LAB — CUP PACEART REMOTE DEVICE CHECK
Battery Remaining Longevity: 6 mo
Battery Remaining Percentage: 5 %
Battery Voltage: 2.74 V
Brady Statistic RV Percent Paced: 61 %
Date Time Interrogation Session: 20231218020907
Implantable Lead Connection Status: 753985
Implantable Lead Implant Date: 20130419
Implantable Lead Location: 753860
Implantable Pulse Generator Implant Date: 20130419
Lead Channel Impedance Value: 400 Ohm
Lead Channel Pacing Threshold Amplitude: 0.75 V
Lead Channel Pacing Threshold Pulse Width: 0.5 ms
Lead Channel Sensing Intrinsic Amplitude: 12 mV
Lead Channel Setting Pacing Amplitude: 2.5 V
Lead Channel Setting Pacing Pulse Width: 0.5 ms
Lead Channel Setting Sensing Sensitivity: 2.5 mV
Pulse Gen Model: 1210
Pulse Gen Serial Number: 7313697

## 2022-10-01 NOTE — Telephone Encounter (Signed)
Please call and check on him today and see if he is feeling any better?

## 2022-10-01 NOTE — Telephone Encounter (Signed)
-----   Message from [name], [place] sent at 09/26/2022  4:29 PM EST ----- Call and check on  patient

## 2022-10-01 NOTE — Telephone Encounter (Signed)
Spoke with Pt he stated he does feel better, although he still isnt having many bowel movements. After CT scan he "exploded" in the bathroom pt stated proabably from the dye. Pt also stated his urine is no longer as dark, nothing like before.

## 2022-10-02 NOTE — Telephone Encounter (Signed)
Patient notified of note below and advised to call urologist for follow up appointment

## 2022-10-17 ENCOUNTER — Ambulatory Visit: Payer: PPO | Attending: Cardiovascular Disease | Admitting: Cardiovascular Disease

## 2022-10-17 ENCOUNTER — Encounter: Payer: Self-pay | Admitting: Cardiovascular Disease

## 2022-10-17 ENCOUNTER — Ambulatory Visit (INDEPENDENT_AMBULATORY_CARE_PROVIDER_SITE_OTHER): Payer: PPO

## 2022-10-17 VITALS — BP 150/86 | HR 65 | Ht 69.0 in | Wt 208.2 lb

## 2022-10-17 DIAGNOSIS — I442 Atrioventricular block, complete: Secondary | ICD-10-CM

## 2022-10-17 DIAGNOSIS — J90 Pleural effusion, not elsewhere classified: Secondary | ICD-10-CM | POA: Diagnosis not present

## 2022-10-17 DIAGNOSIS — E7800 Pure hypercholesterolemia, unspecified: Secondary | ICD-10-CM | POA: Diagnosis not present

## 2022-10-17 DIAGNOSIS — Z950 Presence of cardiac pacemaker: Secondary | ICD-10-CM | POA: Diagnosis not present

## 2022-10-17 DIAGNOSIS — D6869 Other thrombophilia: Secondary | ICD-10-CM | POA: Diagnosis not present

## 2022-10-17 DIAGNOSIS — Z7901 Long term (current) use of anticoagulants: Secondary | ICD-10-CM

## 2022-10-17 DIAGNOSIS — I4811 Longstanding persistent atrial fibrillation: Secondary | ICD-10-CM | POA: Diagnosis not present

## 2022-10-17 DIAGNOSIS — I5042 Chronic combined systolic (congestive) and diastolic (congestive) heart failure: Secondary | ICD-10-CM | POA: Diagnosis not present

## 2022-10-17 DIAGNOSIS — I6523 Occlusion and stenosis of bilateral carotid arteries: Secondary | ICD-10-CM | POA: Diagnosis not present

## 2022-10-17 DIAGNOSIS — I482 Chronic atrial fibrillation, unspecified: Secondary | ICD-10-CM | POA: Diagnosis not present

## 2022-10-17 DIAGNOSIS — I10 Essential (primary) hypertension: Secondary | ICD-10-CM | POA: Diagnosis not present

## 2022-10-17 DIAGNOSIS — I2510 Atherosclerotic heart disease of native coronary artery without angina pectoris: Secondary | ICD-10-CM | POA: Diagnosis not present

## 2022-10-17 LAB — POCT INR: INR: 2 (ref 2.0–3.0)

## 2022-10-17 MED ORDER — AMLODIPINE BESYLATE 2.5 MG PO TABS: 2.5000 mg | ORAL_TABLET | Freq: Every day | ORAL | 3 refills | Status: DC

## 2022-10-17 NOTE — Patient Instructions (Addendum)
Medication Instructions:  START amlodipine 2.'5mg'$  daily   *If you need a refill on your cardiac medications before your next appointment, please call your pharmacy*    Follow-Up: At [facility], you and your health needs are our priority.  As part of our continuing mission to provide you with exceptional heart care, we have created designated Provider Care Teams.  These Care Teams include your primary Cardiologist (physician) and Advanced Practice Providers (APPs -  Physician Assistants and Nurse Practitioners) who all work together to provide you with the care you need, when you need it.  We recommend signing up for the patient portal called "MyChart".  Sign up information is provided on this After Visit Summary.  MyChart is used to connect with patients for Virtual Visits (Telemedicine).  Patients are able to view lab/test results, encounter notes, upcoming appointments, etc.  Non-urgent messages can be sent to your provider as well.   To learn more about what you can do with MyChart, go to [email or web address].    Your next appointment:    6 months with Dr. Sallyanne Kuster  -- call in March or April for a July 2024 appointment  Dr. Sallyanne Kuster has requested that you check your BP at home 1-2 times daily. It is best to check your BP about 1-2 hours after taking your BP medication so that you can see how effective the medicine is. Please send your readings via MyChart at the end of the month.  HOW TO TAKE YOUR BLOOD PRESSURE: Rest 5 minutes before taking your blood pressure. Don't smoke or drink caffeinated beverages for at least 30 minutes before. Take your blood pressure before (not after) you eat. Sit comfortably with your back supported and both feet on the floor (don't cross your legs). Elevate your arm to heart level on a table or a desk. Use the proper sized cuff. It should fit smoothly and snugly around your bare upper arm. There should be enough room to slip a fingertip under  the cuff. The bottom edge of the cuff should be 1 inch above the crease of the elbow. Ideally, take 3 measurements at one sitting and record the average.

## 2022-10-17 NOTE — Patient Instructions (Signed)
Description   Take 2 tablets today and then continue taking 1.5 tablets every day except 1 tablet Monday and Friday. Repeat INR in 6 weeks

## 2022-10-19 NOTE — Progress Notes (Signed)
Patient ID: Benjamin Mckay, male   DOB: 04/10/1946, 77 y.o.   MRN: [id number]     Cardiology Office Note    Date:  10/19/2022   ID:  Benjamin Mckay, DOB [birth year], MRN [id number]  PCP:  [name], FNP  Cardiologist:   Sanda Klein, MD   Chief complaint: AFib, Pacemaker check   History of Present Illness:  Benjamin Mckay is a 77 y.o. male with complex cardiac history (intermittent complete heart block-pacemaker dependent, chronic atrial fibrillation versus atrial standstill, history of sustained ventricular tachycardia, coronary artery disease with history of inferior wall scar due to myocardial infarction, renal and carotid artery stenosis), amiodarone induced hyperthyroidism (now quiescent) and a recurrent right pleural effusion (s/p pleurodesis June 2017).  Benjamin Mckay continues to feel well.  He plays golf and does not feel held back by shortness of breath or chest discomfort.  He has not had dizziness, palpitations or syncope.  Denies orthopnea, PND, lower extremity edema, claudication, focal neurological events.  He is having increasing problems with frequent urination due to enlarged prostate.  He has noticed that his blood pressure has been persistently high for at least the last 3 months, usually around 150/90, as it is today.  He has not had any falls, injuries or serious bleeding problems.  Pacemaker interrogation today shows normal function, but his battery is approaching ERI, which is anticipated to occur in the next 6 months or so.  He has roughly 65% ventricular pacing.  Presenting rhythm today is atrial fibrillation with ventricular pacing.  His QRS is quite brought at 204 ms, QTc 480 ms.  Lead parameters look normal.  On October 19 he had 3 episodes of high ventricular rate with a cycle length of about 300-320 ms, with a QRS complex that looks similar after arrhythmia termination when he has atrial fibrillation with controlled ventricular rates.  Suspect these episodes of  high ventricular rate represent atrial flutter with 2: 1 AV conduction.  Most recent LDL cholesterol was good at 72, albeit a little higher than the year before when it was 54.  He is compliant with INR monitoring and today was therapeutic at 2.0.  Very surprisingly, at a previous appointment in April 2023 his electrocardiogram showed a period of clear-cut normal sinus rhythm.  He had been felt to have atrial standstill and/or atrial fibrillation for years now.  He has a single-chamber pacemaker for this reason.  This probably explains why he sometimes has episodes of regular rhythm without change in QRS morphology recorded by his device.  His most recent LDL cholesterol was excellent at 54 on pravastatin.  He is compliant with warfarin anticoagulation and monitoring, INR today was 2.2..  He has not had a subtherapeutic INR since July 2022.  In the last 12 months he has only had one reading out of range at 1.9.  Past Medical History:  Diagnosis Date   Atrial fibrillation ([place])    Cardiomyopathy, ischemic 11/07/2013   Cataract    bil cataracts removed   CHB (complete heart block) ([place]) 11/06/2013   Chronic combined systolic and diastolic CHF (congestive heart failure) (HCC)    Clotting disorder (HCC)    TIA , cva - PT ON COUMADINE   Colon polyps    Colon polyps    Congenital heart block    Constipation    CVA (cerebral infarction)    Diverticulosis    Esophageal stricture    Family history of adverse reaction to anesthesia  Heart murmur    Hyperlipidemia    Hypertension    Hyperthyroidism 11/16/2015   Lymphoma ([place]) 1998   of colon    Presence of permanent cardiac pacemaker    [employer]  pt.states he is totally dependent on pacemaker   Shortness of breath dyspnea    Pulmonary Effusion   Stroke ([place])    TIA, cva   Umbilical hernia    Urinary frequency     Past Surgical History:  Procedure Laterality Date   [place]  01/06/2003   Recommend  medical therapy   CARDIOVASCULAR STRESS TEST  07/16/2012   Mild-moderate perfusion defect seen in Basal inferior, Mid inferior, and Apica lateral consistent with infarct/scar. No scintigraphic evidence for inducible myocardial ischemia. No ECG changes. EKG negative for ischemia.   CAROTID DOPPLER  11/04/2012   Proximal Rt ICA 50-99% diameter reduction; Lft Bulb demonstrated mild amount homogeneous plaque-not hemodynamically significant; Lft ICA-normal patency.   CHEST TUBE INSERTION Right 03/15/2016   Procedure: INSERTION PLEURAL DRAINAGE CATHETER;  Surgeon: Ivin Poot, MD;  Location: [facility];  Service: Thoracic;  Laterality: Right;   COLECTOMY     for lymphoma   COLONOSCOPY     neck fusion     PACEMAKER GENERATOR CHANGE  01/31/2012   [employer] Jude Med Accent DR RF model [id number] serial [phone]   PACEMAKER PLACEMENT     replaced 3 x   PERMANENT PACEMAKER GENERATOR CHANGE N/A 01/31/2012   Procedure: PERMANENT PACEMAKER GENERATOR CHANGE;  Surgeon: Sanda Klein, MD;  Location: [facility] CATH LAB;    PLEURADESIS N/A 04/02/2016   Procedure: Antionette Poles;  Surgeon: Ivin Poot, MD;  Location: [facility];  Service: Thoracic;  Laterality: N/A;   REMOVAL OF PLEURAL DRAINAGE CATHETER Right 07/26/2016   Procedure: REMOVAL OF PLEURAL DRAINAGE CATHETER;  Surgeon: Ivin Poot, MD;  Location: [facility];  Service: Thoracic;  Laterality: Right;   TONSILLECTOMY     TRANSTHORACIC ECHOCARDIOGRAM  11/04/2012   EF 29-92%, systolic function moderately reduced, mild regurg of the aortic and mitral valves.   VIDEO ASSISTED THORACOSCOPY Right 04/02/2016   Procedure: Right VIDEO ASSISTED THORACOSCOPY with Biopsies and drainage pleural effusion;  Surgeon: Ivin Poot, MD;  Location: [facility] OR;  Service: Thoracic;  Laterality: Right;    Current Medications: Outpatient Medications Prior to Visit  Medication Sig Dispense Refill   acetaminophen (TYLENOL) 500 MG tablet Take 1,000 mg by mouth every 6 (six) hours as needed for moderate  pain.      Evolocumab (REPATHA SURECLICK) 426 MG/ML SOAJ INJECT 140 MG (CONTENTS OF 1 INJECTOR) INTO THE SKIN EVERY 14 (FOURTEEN) DAYS 6 mL 1   fluticasone (FLONASE) 50 MCG/ACT nasal spray SPRAY 2 SPRAYS INTO EACH NOSTRIL EVERY DAY 48 mL 0   furosemide (LASIX) 20 MG tablet TAKE 1 TO 2 TABLETS BY MOUTH DAILY AS DIRECTED 180 tablet 3   metoprolol succinate (TOPROL-XL) 25 MG 24 hr tablet TAKE 1 TABLET BY MOUTH TWICE A DAY 180 tablet 3   pravastatin (PRAVACHOL) 80 MG tablet TAKE 1 TABLET BY MOUTH EVERY DAY 90 tablet 3   tamsulosin (FLOMAX) 0.4 MG CAPS capsule Take 0.4 mg by mouth at bedtime.     traZODone (DESYREL) 50 MG tablet TAKE 1/2 TO 1 TABLET BY MOUTH AT BEDTIME AS NEEDED FOR SLEEP 90 tablet 1   warfarin (COUMADIN) 5 MG tablet Take 5 mg by mouth. Take 1 1/2 tablet every day except Monday and Friday. Monday and Friday  take 1 tablet.     warfarin (COUMADIN) 2.5 MG tablet TAKE 1 TO 1.5 TABLETS BY MOUTH DAILY AS DIRECTED BY THE COUMADIN CLINIC (Patient not taking: Reported on 10/17/2022) 135 tablet 0   No facility-administered medications prior to visit.     Allergies:   Statins, Adhesive [tape], Latex, and Penicillins   Social History   Socioeconomic History   Marital status: Married    Spouse name: Chartered certified accountant   Number of children: 3   Years of education: 16   Highest education level: Not on file  Occupational History   Occupation: Retired  Tobacco Use   Smoking status: Never   Smokeless tobacco: Never  Vaping Use   Vaping Use: Never used  Substance and Sexual Activity   Alcohol use: No    Alcohol/week: 0.0 standard drinks of alcohol   Drug use: No   Sexual activity: Yes  Other Topics Concern   Not on file  Social History Narrative   Fun: Golf when he can   Consumes caffeine 2 cups per day     Social Determinants of Health   Financial Resource Strain: Low Risk  (08/07/2020)   Overall Financial Resource Strain (CARDIA)    Difficulty of Paying Living Expenses: Not hard  at all  Food Insecurity: No Food Insecurity (08/07/2020)   Hunger Vital Sign    Worried About Running Out of Food in the Last Year: Never true    [place] in the Last Year: Never true  Transportation Needs: No Transportation Needs (08/07/2020)   PRAPARE - Hydrologist (Medical): No    Lack of Transportation (Non-Medical): No  Physical Activity: Sufficiently Active (08/07/2020)   Exercise Vital Sign    Days of Exercise per Week: 5 days    Minutes of Exercise per Session: 30 min  Stress: No Stress Concern Present (08/07/2020)   [place]    Feeling of Stress : Not at all  Social Connections: [place] (08/07/2020)   Social Connection and Isolation Panel [NHANES]    Frequency of Communication with Friends and Family: More than three times a week    Frequency of Social Gatherings with Friends and Family: More than three times a week    Attends Religious Services: More than 4 times per year    Active Member of [employer] or Organizations: Yes    Attends Music therapist: More than 4 times per year    Marital Status: Married     Family History:  The patient's family history includes Diabetes in his maternal grandmother; Heart disease in his mother; Prostate cancer in his father; Stroke in his mother.   ROS:   Please see the history of present illness.    ROS all other systems are reviewed and are negative  PHYSICAL EXAM:   VS:  BP (!) 150/86 (BP Location: Left Arm, Patient Position: Sitting, Cuff Size: Large)   Pulse 65   Ht '5\' 9"'$  (1.753 m)   Wt 208 lb 3.2 oz (94.4 kg)   SpO2 98%   BMI 30.75 kg/m      General: Alert, oriented x3, no distress, healthy subclavian pacemaker site.  Borderline obese, appears younger than stated age. Head: no evidence of trauma, PERRL, EOMI, no exophtalmos or lid lag, no myxedema, no xanthelasma; normal ears, nose and  oropharynx Neck: normal jugular venous pulsations and no hepatojugular reflux; brisk carotid pulses without delay and no  carotid bruits Chest: Diminished breath sounds and increased also percussion in the right base (previous pleurodesis) Cardiovascular: normal position and quality of the apical impulse, regular rhythm, normal first and paradoxically split second heart sounds, no murmurs, rubs or gallops Abdomen: no tenderness or distention, no masses by palpation, no abnormal pulsatility or arterial bruits, normal bowel sounds, no hepatosplenomegaly Extremities: no clubbing, cyanosis or edema; 2+ radial, ulnar and brachial pulses bilaterally; 2+ right femoral, posterior tibial and dorsalis pedis pulses; 2+ left femoral, posterior tibial and dorsalis pedis pulses; no subclavian or femoral bruits Neurological: grossly nonfocal Psych: Normal mood and affect     Wt Readings from Last 3 Encounters:  10/17/22 208 lb 3.2 oz (94.4 kg)  09/26/22 209 lb 12.8 oz (95.2 kg)  08/06/22 208 lb 9.6 oz (94.6 kg)      Studies/Labs Reviewed:   EKG:  EKG is ordered today.  It shows atrial fibrillation with 100% ventricular paced rhythm at 65 bpm.  Broad QRS at 204 ms, QTc 480 ms.   Previous electrogram from 01/14/2022 shows a period of sinus rhythm followed by a ventricular paced beat and then possible atrial fibrillation, left bundle branch block with QRS duration 148 ms and left axis deviation, QTc normal at 416 ms.  Recent Labs: 08/06/2022: ALT 15; BUN 15; Creatinine, Ser 1.10; Hemoglobin 15.0; Platelets 183.0; Potassium 4.5; Sodium 136   Lipid Panel    Component Value Date/Time   CHOL 130 08/06/2022 1050   CHOL 102 02/23/2020 0909   TRIG 77.0 08/06/2022 1050   HDL 42.30 08/06/2022 1050   HDL 43 02/23/2020 0909   CHOLHDL 3 08/06/2022 1050   VLDL 15.4 08/06/2022 1050   LDLCALC 72 08/06/2022 1050   LDLCALC 46 02/23/2020 0909     ASSESSMENT:    1. Chronic combined systolic (congestive) and  diastolic (congestive) heart failure ([place])   2. CHB (complete heart block) (HCC)   3. Longstanding persistent atrial fibrillation ([place])   4. Coronary artery disease involving native coronary artery of native heart without angina pectoris   5. Essential hypertension   6. Pacemaker   7. Pleural effusion, right   8. Acquired thrombophilia ([place])   9. Bilateral carotid artery stenosis   10. Hypercholesterolemia      PLAN:  In order of problems listed above:  CHF: NYHA functional class I, clinically euvolemic taking a very low-dose of diuretic.  Dry weight appears to be 2102 pounds.  Most recent assessment of LVEF was 50% by echo in 2017.  It would probably be a good idea to repeat his echocardiogram before we change out his pacemaker generator. CHB: He is not pacemaker dependent and requires roughly 50% ventricular pacing.  ECG last April shows clear evidence of sinus rhythm with 1: 1 AV conduction, although it was felt that he has permanent atrial fibrillation.  This explains why sometimes his device will record regular tachycardia, probably sinus tachycardia or atrial flutter with 2: 1 AV block.  His QRS is always very broad. On one occasion he had a near syncopal event and similar arrhythmia, but retrospectively one wonders whether that was a vasovagal event/hypotension (occurred outside in the heat at a golf tournament).   AFib: He has long periods of persistent atrial fibrillation, but surprisingly this was demonstrated to not  be permanent atrial fibrillation on ECG performed earlier this year.  The presence or absence of atrial fibrillation versus irregular rhythm appears to have no impact on his functional status or symptoms.  CHADSVasc 4 (  age, CHF, CAD, HTN).  On appropriate anticoagulation.  No embolic events.  The current dose of beta-blocker seems to be maintaining a reasonable balance between excessive ventricular pacing which could cause worsening heart failure versus control of the  episodes of tachyarrhythmia. CAD: Does not have angina pectoris, although he is quite physically active.  He has evidence of extensive calcification in all 3 coronary arteries, but has never undergone coronary angiography.  Nuclear stress testing shows evidence of an old inferior scar but no reversible ischemia.  He has never complained of angina pectoris.  He prefers to avoid invasive evaluation. HTN: Persistently elevated readings over the last 3 months.  Will add low-dose amlodipine. PPM: Single-chamber device.  Device function is normal but he was likely to reach replacement indicator in about 6 months.  Increase the frequency of remote downloads to monthly. Upgrade to a dual-chamber or CRT device has been discussed but felt to be high-risk due to his multiple previous surgical procedures and increased risk of infection Right lung consolidation: History of previous right pleurodesis. Anticoagulation: Therapeutic INR better than 90% of the time of the last year.  No bleeding complications. Renal artery stenosis: Most recent creatinine was 1.1, better than earlier this year. Carotid stenosis: Asymptomatic.  He has declined ultrasound monitoring. HLP: Target LDL less than 70.  Very close to target range on combination pravastatin (he did not tolerate more potent statins) and Repatha.    Medication Adjustments/Labs and Tests Ordered: Current medicines are reviewed at length with the patient today.  Concerns regarding medicines are outlined above.  Medication changes, Labs and Tests ordered today are listed in the Patient Instructions below. Patient Instructions  Medication Instructions:  START amlodipine 2.'5mg'$  daily   *If you need a refill on your cardiac medications before your next appointment, please call your pharmacy*    Follow-Up: At [facility], you and your health needs are our priority.  As part of our continuing mission to provide you with exceptional heart care, we have  created designated Provider Care Teams.  These Care Teams include your primary Cardiologist (physician) and Advanced Practice Providers (APPs -  Physician Assistants and Nurse Practitioners) who all work together to provide you with the care you need, when you need it.  We recommend signing up for the patient portal called "MyChart".  Sign up information is provided on this After Visit Summary.  MyChart is used to connect with patients for Virtual Visits (Telemedicine).  Patients are able to view lab/test results, encounter notes, upcoming appointments, etc.  Non-urgent messages can be sent to your provider as well.   To learn more about what you can do with MyChart, go to [email or web address].    Your next appointment:    6 months with Dr. Sallyanne Kuster  -- call in March or April for a July 2024 appointment  Dr. Sallyanne Kuster has requested that you check your BP at home 1-2 times daily. It is best to check your BP about 1-2 hours after taking your BP medication so that you can see how effective the medicine is. Please send your readings via MyChart at the end of the month.  HOW TO TAKE YOUR BLOOD PRESSURE: Rest 5 minutes before taking your blood pressure. Don't smoke or drink caffeinated beverages for at least 30 minutes before. Take your blood pressure before (not after) you eat. Sit comfortably with your back supported and both feet on the floor (don't cross your legs). Elevate your arm to heart level on a table  or a desk. Use the proper sized cuff. It should fit smoothly and snugly around your bare upper arm. There should be enough room to slip a fingertip under the cuff. The bottom edge of the cuff should be 1 inch above the crease of the elbow. Ideally, take 3 measurements at one sitting and record the average.         Signed, Sanda Klein, MD  10/19/2022 12:53 PM    [address], [place], [place]  [address] Phone: [phone]; Fax: [phone]

## 2022-10-21 ENCOUNTER — Telehealth: Payer: Self-pay

## 2022-10-21 DIAGNOSIS — I482 Chronic atrial fibrillation, unspecified: Secondary | ICD-10-CM

## 2022-10-21 DIAGNOSIS — I255 Ischemic cardiomyopathy: Secondary | ICD-10-CM

## 2022-10-21 NOTE — Telephone Encounter (Signed)
Spoke to patient Dr.Croitoru wants you to have a echo before your next appointment in 6 months.Scheduler will be calling with echo appointment.

## 2022-10-31 ENCOUNTER — Ambulatory Visit: Payer: PPO | Attending: Cardiovascular Disease

## 2022-10-31 DIAGNOSIS — I255 Ischemic cardiomyopathy: Secondary | ICD-10-CM | POA: Diagnosis not present

## 2022-10-31 LAB — CUP PACEART REMOTE DEVICE CHECK
Battery Remaining Longevity: 6 mo
Battery Remaining Percentage: 5 %
Battery Voltage: 2.74 V
Brady Statistic RV Percent Paced: 61 %
Date Time Interrogation Session: 20240118041952
Implantable Lead Connection Status: 753985
Implantable Lead Implant Date: 20130419
Implantable Lead Location: 753860
Implantable Pulse Generator Implant Date: 20130419
Lead Channel Impedance Value: 440 Ohm
Lead Channel Pacing Threshold Amplitude: 0.75 V
Lead Channel Pacing Threshold Pulse Width: 0.5 ms
Lead Channel Sensing Intrinsic Amplitude: 12 mV
Lead Channel Setting Pacing Amplitude: 2.5 V
Lead Channel Setting Pacing Pulse Width: 0.5 ms
Lead Channel Setting Sensing Sensitivity: 2.5 mV
Pulse Gen Model: 1210
Pulse Gen Serial Number: 7313697

## 2022-11-04 ENCOUNTER — Encounter: Payer: Self-pay | Admitting: Gastroenterology

## 2022-11-04 DIAGNOSIS — R3121 Asymptomatic microscopic hematuria: Secondary | ICD-10-CM | POA: Diagnosis not present

## 2022-11-04 DIAGNOSIS — R3911 Hesitancy of micturition: Secondary | ICD-10-CM | POA: Diagnosis not present

## 2022-11-04 DIAGNOSIS — N401 Enlarged prostate with lower urinary tract symptoms: Secondary | ICD-10-CM | POA: Diagnosis not present

## 2022-11-06 NOTE — Addendum Note (Signed)
Addended by: Douglass Rivers D on: 11/06/2022 03:11 PM   Modules accepted: Level of Service

## 2022-11-06 NOTE — Progress Notes (Signed)
Remote pacemaker transmission.   

## 2022-11-16 ENCOUNTER — Other Ambulatory Visit: Payer: Self-pay | Admitting: Cardiovascular Disease

## 2022-11-21 NOTE — Progress Notes (Signed)
Remote pacemaker transmission.   

## 2022-11-27 ENCOUNTER — Ambulatory Visit: Payer: PPO

## 2022-11-28 ENCOUNTER — Ambulatory Visit: Payer: PPO | Attending: Cardiology

## 2022-11-28 DIAGNOSIS — I482 Chronic atrial fibrillation, unspecified: Secondary | ICD-10-CM | POA: Diagnosis not present

## 2022-11-28 LAB — POCT INR: INR: 1.9 — AB (ref 2.0–3.0)

## 2022-11-28 NOTE — Patient Instructions (Signed)
Description   Take 2 tablets today and then continue taking 1.5 tablets every day except 1 tablet Monday and Friday. Repeat INR in 5 weeks

## 2022-12-01 LAB — CUP PACEART REMOTE DEVICE CHECK
Battery Remaining Longevity: 5 mo
Battery Remaining Percentage: 4 %
Battery Voltage: 2.72 V
Brady Statistic RV Percent Paced: 62 %
Date Time Interrogation Session: 20240218023033
Implantable Lead Connection Status: 753985
Implantable Lead Implant Date: 20130419
Implantable Lead Location: 753860
Implantable Pulse Generator Implant Date: 20130419
Lead Channel Impedance Value: 440 Ohm
Lead Channel Pacing Threshold Amplitude: 0.75 V
Lead Channel Pacing Threshold Pulse Width: 0.5 ms
Lead Channel Sensing Intrinsic Amplitude: 12 mV
Lead Channel Setting Pacing Amplitude: 2.5 V
Lead Channel Setting Pacing Pulse Width: 0.5 ms
Lead Channel Setting Sensing Sensitivity: 2.5 mV
Pulse Gen Model: 1210
Pulse Gen Serial Number: 7313697

## 2022-12-02 ENCOUNTER — Encounter: Payer: Self-pay | Admitting: Gastroenterology

## 2022-12-02 ENCOUNTER — Ambulatory Visit: Payer: PPO | Admitting: Gastroenterology

## 2022-12-02 ENCOUNTER — Ambulatory Visit (INDEPENDENT_AMBULATORY_CARE_PROVIDER_SITE_OTHER): Payer: PPO

## 2022-12-02 VITALS — BP 130/72 | HR 69 | Ht 69.0 in | Wt 205.0 lb

## 2022-12-02 DIAGNOSIS — I482 Chronic atrial fibrillation, unspecified: Secondary | ICD-10-CM

## 2022-12-02 DIAGNOSIS — K648 Other hemorrhoids: Secondary | ICD-10-CM

## 2022-12-02 DIAGNOSIS — Z7901 Long term (current) use of anticoagulants: Secondary | ICD-10-CM

## 2022-12-02 NOTE — Patient Instructions (Signed)
_______________________________________________________  If your blood pressure at your visit was 140/90 or greater, please contact your primary care physician to follow up on this.  _______________________________________________________  If you are age 77 or older, your body mass index should be between 23-30. Your Body mass index is 30.27 kg/m. If this is out of the aforementioned range listed, please consider follow up with your Primary Care Provider.  If you are age 66 or younger, your body mass index should be between 19-25. Your Body mass index is 30.27 kg/m. If this is out of the aformentioned range listed, please consider follow up with your Primary Care Provider.   ________________________________________________________  The [facility] providers would like to encourage you to use [facility] to communicate with providers for non-urgent requests or questions.  Due to long hold times on the telephone, sending your provider a message by [facility] may be a faster and more efficient way to get a response.  Please allow 48 business hours for a response.  Please remember that this is for non-urgent requests.  _______________________________________________________  We will send your records to [facility].   [facility] is located at [address]. Should you need to reschedule your appointment, please contact them at [phone].   It was a pleasure to see you today!  Thank you for trusting me with your gastrointestinal care!

## 2022-12-02 NOTE — Progress Notes (Addendum)
[facility] Consult Note:  History: [facility] 12/02/2022  Referring provider: Marrian Mckay, Benjamin Mckay  Reason for consult/chief complaint: Hemorrhoids (Patient states that 2 days ago he started to feel bulging. Patient states occasionally when he wipes he sees a small amount of blood on toilet tissue. )   Subjective  HPI: Benjamin Mckay is a 77 y.o. male who is seen today for consult regarding hemorrhoids at the request of his PCP FNP Jodi Mourning. He is on Coumadin given Hx of chronic A-fib. Last colonoscopy (Dr. Loletha Mckay) December 2020.  2 diminutive adenomatous polyps, healthy appearing left colon anastomosis.  No future recall recommended.  History colonic lymphoma diagnosed 1998, treated with resection and chemotherapy.    Today, he states that he has pain and anal bleeding with passing bowel movements for several years. He feels as if his hemorrhoids bulge after bowel movements causing the pain to persist until the bulging lessens. Lately his symptoms have become so bothersome that he tries to avoid passing bowel movements. He reports that he often has to wipe excessively to get himself clean due to "sticky" stools. He tries to eat a well-balanced diet with fibrous foods. He denies any abdominal pain, trouble swallowing, pain with swallowing, diarrhea, constipation, or straining with bowel movements.   ROS: Review of Systems  Constitutional:  Negative for appetite change and fever.  HENT:  Negative for trouble swallowing.   Respiratory:  Negative for cough and shortness of breath.   Cardiovascular:  Negative for chest pain.  Gastrointestinal:  Positive for anal bleeding and rectal pain. Negative for abdominal distention, abdominal pain, blood in stool, constipation, diarrhea, nausea and vomiting.  Genitourinary:  Negative for dysuria.  Musculoskeletal:  Negative for back pain.  Skin:  Negative for rash.  Neurological:  Negative for weakness.  All  other systems reviewed and are negative.    Past Medical History: Past Medical History:  Diagnosis Date   Atrial fibrillation ([place])    Cardiomyopathy, ischemic 11/07/2013   Cataract    bil cataracts removed   CHB (complete heart block) ([place]) 11/06/2013   Chronic combined systolic and diastolic CHF (congestive heart failure) ([facility])    Clotting disorder ([facility])    TIA , cva - PT ON COUMADINE   Colon polyps    Colon polyps    Congenital heart block    Constipation    CVA (cerebral infarction)    Diverticulosis    Esophageal stricture    Family history of adverse reaction to anesthesia    Heart murmur    Hyperlipidemia    Hypertension    Hyperthyroidism 11/16/2015   Lymphoma ([place]) 1998   of colon    Presence of permanent cardiac pacemaker    St. Jude  pt.states he is totally dependent on pacemaker   Shortness of breath dyspnea    Pulmonary Effusion   Stroke ([place])    TIA, cva   Umbilical hernia    Urinary frequency      Past Surgical History: Past Surgical History:  Procedure Laterality Date   APPENDECTOMY  1953   CARDIAC CATHETERIZATION  01/06/2003   Recommend medical therapy   CARDIOVASCULAR STRESS TEST  07/16/2012   Mild-moderate perfusion defect seen in Basal inferior, Mid inferior, and Apica lateral consistent with infarct/scar. No scintigraphic evidence for inducible myocardial ischemia. No ECG changes. EKG negative for ischemia.   CAROTID DOPPLER  11/04/2012   Proximal Rt ICA 50-99% diameter reduction; Lft Bulb demonstrated mild amount homogeneous plaque-not  hemodynamically significant; Lft ICA-normal patency.   CHEST TUBE INSERTION Right 03/15/2016   Procedure: INSERTION PLEURAL DRAINAGE CATHETER;  Surgeon: Ivin Poot, MD;  Location: [facility];  Service: Thoracic;  Laterality: Right;   COLECTOMY     for lymphoma   COLONOSCOPY     neck fusion     PACEMAKER GENERATOR CHANGE  01/31/2012   St Jude Med Accent DR RF model [id number] serial [id number]   PACEMAKER PLACEMENT      replaced 3 x   PERMANENT PACEMAKER GENERATOR CHANGE N/A 01/31/2012   Procedure: PERMANENT PACEMAKER GENERATOR CHANGE;  Surgeon: Sanda Klein, MD;  Location: [facility] CATH [facility];    PLEURADESIS N/A 04/02/2016   Procedure: Antionette Poles;  Surgeon: Ivin Poot, MD;  Location: [facility];  Service: Thoracic;  Laterality: N/A;   REMOVAL OF PLEURAL DRAINAGE CATHETER Right 07/26/2016   Procedure: REMOVAL OF PLEURAL DRAINAGE CATHETER;  Surgeon: Ivin Poot, MD;  Location: [facility] OR;  Service: Thoracic;  Laterality: Right;   TONSILLECTOMY     TRANSTHORACIC ECHOCARDIOGRAM  11/04/2012   EF 123456, systolic function moderately reduced, mild regurg of the aortic and mitral valves.   VIDEO ASSISTED THORACOSCOPY Right 04/02/2016   Procedure: Right VIDEO ASSISTED THORACOSCOPY with Biopsies and drainage pleural effusion;  Surgeon: Ivin Poot, MD;  Location: [facility] OR;  Service: Thoracic;  Laterality: Right;     Family History: Family History  Problem Relation Age of Onset   Prostate cancer Father    Heart disease Mother    Stroke Mother    Diabetes Maternal Grandmother    Colon cancer Neg Hx    Heart attack Neg Hx    Esophageal cancer Neg Hx    Stomach cancer Neg Hx    Rectal cancer Neg Hx     Social History: Social History   Socioeconomic History   Marital status: Married    Spouse name: Chartered certified accountant   Number of children: 3   Years of education: 16   Highest education level: Not on file  Occupational History   Occupation: Retired  Tobacco Use   Smoking status: Never   Smokeless tobacco: Never  Vaping Use   Vaping Use: Never used  Substance and Sexual Activity   Alcohol use: No    Alcohol/week: 0.0 standard drinks of alcohol   Drug use: No   Sexual activity: Yes  Other Topics Concern   Not on file  Social History Narrative   Fun: Golf when he can   Consumes caffeine 2 cups per day     Social Determinants of Health   Financial Resource Strain: Low Risk  (08/07/2020)   Overall  Financial Resource Strain (CARDIA)    Difficulty of Paying Living Expenses: Not hard at all  Food Insecurity: No Food Insecurity (08/07/2020)   Hunger Vital Sign    Worried About Running Out of Food in the Last Year: Never true    [place] in the Last Year: Never true  Transportation Needs: No Transportation Needs (08/07/2020)   PRAPARE - Hydrologist (Medical): No    Lack of Transportation (Non-Medical): No  Physical Activity: Sufficiently Active (08/07/2020)   Exercise Vital Sign    Days of Exercise per Week: 5 days    Minutes of Exercise per Session: 30 min  Stress: No Stress Concern Present (08/07/2020)   [place]    Feeling of Stress : Not at all  Social  Connections: Socially Integrated (08/07/2020)   Social Connection and Isolation Panel [NHANES]    Frequency of Communication with Friends and Family: More than three times a week    Frequency of Social Gatherings with Friends and Family: More than three times a week    Attends Religious Services: More than 4 times per year    Active Member of [employer] or Organizations: Yes    Attends Music therapist: More than 4 times per year    Marital Status: Married    Allergies: Allergies  Allergen Reactions   Statins Other (See Comments)    Leg cramps.   Tolerates pravastatin.     Adhesive [Tape] Other (See Comments)    Skin irritation - please use paper tape   Latex Other (See Comments)    Skin irritation   Penicillins Rash    Has patient had a PCN reaction causing immediate rash, facial/tongue/throat swelling, SOB or lightheadedness with hypotension: Yes Has patient had a PCN reaction causing severe rash involving mucus membranes or skin necrosis: No Has patient had a PCN reaction that required hospitalization No Has patient had a PCN reaction occurring within the last 10 years: No If all of the above answers are  "NO", then may proceed with Cephalosporin use.    Outpatient Meds: Current Outpatient Medications  Medication Sig Dispense Refill   acetaminophen (TYLENOL) 500 MG tablet Take 1,000 mg by mouth every 6 (six) hours as needed for moderate pain.      amLODipine (NORVASC) 2.5 MG tablet Take 1 tablet (2.5 mg total) by mouth daily. 90 tablet 3   Evolocumab (REPATHA SURECLICK) [id number] MG/ML SOAJ INJECT 140 MG (CONTENTS OF 1 INJECTOR) INTO THE SKIN EVERY 14 (FOURTEEN) DAYS 6 mL 1   fluticasone (FLONASE) 50 MCG/ACT nasal spray SPRAY 2 SPRAYS INTO EACH NOSTRIL EVERY DAY 48 mL 0   furosemide (LASIX) 20 MG tablet TAKE 1 TO 2 TABLETS BY MOUTH DAILY AS DIRECTED 180 tablet 3   metoprolol succinate (TOPROL-XL) 25 MG 24 hr tablet TAKE 1 TABLET BY MOUTH TWICE A DAY 180 tablet 3   pravastatin (PRAVACHOL) 80 MG tablet TAKE 1 TABLET BY MOUTH EVERY DAY 90 tablet 3   tamsulosin (FLOMAX) 0.4 MG CAPS capsule Take 0.4 mg by mouth at bedtime.     traZODone (DESYREL) 50 MG tablet TAKE 1/2 TO 1 TABLET BY MOUTH AT BEDTIME AS NEEDED FOR SLEEP 90 tablet 1   warfarin (COUMADIN) 2.5 MG tablet TAKE 1 TO 1.5 TABLETS BY MOUTH DAILY AS DIRECTED BY THE COUMADIN CLINIC 135 tablet 0   warfarin (COUMADIN) 5 MG tablet Take 5 mg by mouth. Take 1 1/2 tablet every day except Monday and Friday. Monday and Friday take 1 tablet.     No current facility-administered medications for this visit.      ___________________________________________________________________ Objective   Exam:  BP 130/72 (BP Location: Left Arm)   Pulse 69   Ht 5' 9"$  (1.753 m)   Wt 205 lb (93 kg)   SpO2 97%   BMI 30.27 kg/m  Wt Readings from Last 3 Encounters:  12/02/22 205 lb (93 kg)  10/17/22 208 lb 3.2 oz (94.4 kg)  09/26/22 209 lb 12.8 oz (95.2 kg)    General: well-appearing  Eyes: sclera anicteric, no redness ENT: oral mucosa moist without lesions, no cervical or supraclavicular lymphadenopathy CV:  no JVD, no peripheral edema. Pacemaker left upper  chest wall. S4. Resp: clear to auscultation bilaterally, normal RR and effort noted GI: soft,  no tenderness, with active bowel sounds. No guarding or palpable organomegaly noted. Small reducible umbilical hernia. Perianal: normal; DRE exam: normal; Anoscopy: large primarily right-sided internal hemorrhoids Skin; warm and dry, no rash or jaundice noted Neuro: awake, alert and oriented x 3. Normal gross motor function and fluent speech  Labs:   Radiologic Studies:  CT Abdomen Pelvis W Contrast Result Date: 09/26/2022 CLINICAL DATA:  Abdominal pain, acute, nonlocalized worsening constipation x 1 1/2 week; rule out obstruction. Dark urine and constipation x 1 week HX Hernia, colectomy, lymphoma EXAM: CT ABDOMEN AND PELVIS WITH CONTRAST TECHNIQUE: Multidetector CT imaging of the abdomen and pelvis was performed using the standard protocol following bolus administration of intravenous contrast. RADIATION DOSE REDUCTION: This exam was performed according to the departmental dose-optimization program which includes automated exposure control, adjustment of the mA and/or kV according to patient size and/or use of iterative reconstruction technique. CONTRAST:  35m OMNIPAQUE IOHEXOL 300 MG/ML  SOLN COMPARISON:  None Available. FINDINGS: Lower chest: Stable in size chronic loculated right pleural effusion with associated pleural thickening and calcifications. Interval increase in associated calcifications compared to prior. Associated right lower lobe passive atelectasis. Partially visualized cardiac leads. Coronary calcification. Hepatobiliary: No focal liver abnormality. No gallstones, gallbladder wall thickening, or pericholecystic fluid. No biliary dilatation. Pancreas: No focal lesion. Normal pancreatic contour. No surrounding inflammatory changes. No main pancreatic ductal dilatation. Spleen: Normal in size without focal abnormality.  Fluid Adrenals/Urinary Tract: No adrenal nodule bilaterally. Bilateral  kidneys enhance symmetrically. Bilateral renal cortical scarring. Density lesion within the right kidney likely represents a simple renal cyst. Simple renal cysts, in the absence of clinically indicated signs/symptoms, require no independent follow-up. Subcentimeter hypodensities are too small to characterize-no further follow-up indicated. No hydronephrosis. No hydroureter. The urinary bladder is unremarkable. Stomach/Bowel: PO contrast reaches the colon. Stomach is within normal limits. No evidence of bowel wall thickening or dilatation. Colonic diverticulosis. Appendix appears normal. Vascular/Lymphatic: No abdominal aorta or iliac aneurysm. Severe atherosclerotic plaque of the aorta and its branches. No abdominal, pelvic, or inguinal lymphadenopathy. Reproductive: The prostate measures up to 5.1 cm. Other: No intraperitoneal free fluid. No intraperitoneal free gas. No organized fluid collection. Musculoskeletal: Small fat containing umbilical hernia. Associated SLAP stranding of the contained fat (6:76). 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 No suspicious lytic or blastic osseous lesions. No acute displaced fracture. Multilevel degenerative changes of the spine. IMPRESSION: 1. Small fat containing umbilical hernia with slight stranding of the associated fat-correlate clinically for incarceration. No associated bowel obstruction. 2. Stable in size chronic loculated right pleural effusion with associated pleural thickening and calcifications. Interval increase in associated calcifications compared to prior-likely benign. Recommend attention on follow-up given change. 3. Colonic diverticulosis with no acute diverticulitis. 4. Prostatomegaly. 5. Question spermatic cord lipomas bilaterally. 6.  Aortic Atherosclerosis (ICD10-I70.0). Electronically Signed   By: MIven FinnM.D.   On: 09/26/2022 13:59      Assessment: Bleeding internal  hemorrhoids  Long term current use of anticoagulant therapy   Prolapsing and occasionally bleeding internal hemorrhoids.  Possibly amenable to banding, but the RP column especially is sizable and there is increased risk of bleeding if patient cannot interrupt oral anticoagulation.  Plan: Referral to colorectal surgery for evaluation and potential hemorrhoid banding versus surgical therapy. Patient on chronic anticoagulation with Hx of chronic A-fib   Thank you for the courtesy of this consult.  Please call me with any questions or concerns.   -Mallie Mussel  Benjamin Carrow, MD    Velora Heckler Lanette Hampshire, Nelida Meuse III, MD, have reviewed all documentation for this visit. The documentation on 12/02/22 for the exam, diagnosis, procedures, and orders are all accurate and complete.   CC: Referring provider noted above   I,Alexis Herring,acting as a scribe for [place], MD.,have documented all relevant documentation on the behalf of Doran Stabler, MD,as directed by  Doran Stabler, MD while in the presence of Doran Stabler, MD.

## 2022-12-03 ENCOUNTER — Telehealth: Payer: Self-pay

## 2022-12-03 NOTE — Telephone Encounter (Signed)
Referral sent to [facility]. Will await appointment info.

## 2022-12-10 ENCOUNTER — Telehealth: Payer: Self-pay | Admitting: Emergency Medicine

## 2022-12-10 NOTE — Telephone Encounter (Signed)
Called patient- Told him that Dr Sallyanne Kuster reviewed his BP log and said that it is "mostly ok," and to continue same medications. Pt verbalized understanding.

## 2022-12-12 NOTE — Telephone Encounter (Signed)
Appointment has been scheduled for 01-13-2023 with Dr Marcello Moores

## 2022-12-21 ENCOUNTER — Other Ambulatory Visit: Payer: Self-pay | Admitting: Family

## 2023-01-02 ENCOUNTER — Ambulatory Visit (INDEPENDENT_AMBULATORY_CARE_PROVIDER_SITE_OTHER): Payer: PPO

## 2023-01-02 ENCOUNTER — Ambulatory Visit: Payer: PPO | Attending: Cardiovascular Disease | Admitting: *Deleted

## 2023-01-02 DIAGNOSIS — Z7901 Long term (current) use of anticoagulants: Secondary | ICD-10-CM | POA: Diagnosis not present

## 2023-01-02 DIAGNOSIS — I442 Atrioventricular block, complete: Secondary | ICD-10-CM | POA: Diagnosis not present

## 2023-01-02 DIAGNOSIS — I482 Chronic atrial fibrillation, unspecified: Secondary | ICD-10-CM

## 2023-01-02 LAB — CUP PACEART REMOTE DEVICE CHECK
Battery Remaining Longevity: 5 mo
Battery Remaining Percentage: 3 %
Battery Voltage: 2.71 V
Brady Statistic RV Percent Paced: 62 %
Date Time Interrogation Session: 20240321074143
Implantable Lead Connection Status: 753985
Implantable Lead Implant Date: 20130419
Implantable Lead Location: 753860
Implantable Pulse Generator Implant Date: 20130419
Lead Channel Impedance Value: 390 Ohm
Lead Channel Pacing Threshold Amplitude: 0.75 V
Lead Channel Pacing Threshold Pulse Width: 0.5 ms
Lead Channel Sensing Intrinsic Amplitude: 12 mV
Lead Channel Setting Pacing Amplitude: 2.5 V
Lead Channel Setting Pacing Pulse Width: 0.5 ms
Lead Channel Setting Sensing Sensitivity: 2.5 mV
Pulse Gen Model: 1210
Pulse Gen Serial Number: 7313697

## 2023-01-02 LAB — POCT INR: INR: 2 (ref 2.0–3.0)

## 2023-01-02 NOTE — Patient Instructions (Signed)
Description   Continue taking 1.5 tablets every day except 1 tablet Monday and Friday. Repeat INR in 5 weeks

## 2023-01-15 NOTE — Progress Notes (Signed)
Remote pacemaker transmission.   

## 2023-01-28 ENCOUNTER — Other Ambulatory Visit: Payer: Self-pay | Admitting: Cardiovascular Disease

## 2023-02-03 ENCOUNTER — Ambulatory Visit (INDEPENDENT_AMBULATORY_CARE_PROVIDER_SITE_OTHER): Payer: PPO

## 2023-02-03 DIAGNOSIS — I482 Chronic atrial fibrillation, unspecified: Secondary | ICD-10-CM

## 2023-02-03 DIAGNOSIS — I442 Atrioventricular block, complete: Secondary | ICD-10-CM

## 2023-02-04 LAB — CUP PACEART REMOTE DEVICE CHECK
Battery Remaining Longevity: 4 mo
Battery Remaining Percentage: 3 %
Battery Voltage: 2.69 V
Brady Statistic RV Percent Paced: 60 %
Date Time Interrogation Session: 20240422035241
Implantable Lead Connection Status: 753985
Implantable Lead Implant Date: 20130419
Implantable Lead Location: 753860
Implantable Pulse Generator Implant Date: 20130419
Lead Channel Impedance Value: 410 Ohm
Lead Channel Pacing Threshold Amplitude: 0.75 V
Lead Channel Pacing Threshold Pulse Width: 0.5 ms
Lead Channel Sensing Intrinsic Amplitude: 12 mV
Lead Channel Setting Pacing Amplitude: 2.5 V
Lead Channel Setting Pacing Pulse Width: 0.5 ms
Lead Channel Setting Sensing Sensitivity: 2.5 mV
Pulse Gen Model: 1210
Pulse Gen Serial Number: 7313697

## 2023-02-05 NOTE — Progress Notes (Signed)
Remote pacemaker transmission.   

## 2023-02-06 ENCOUNTER — Ambulatory Visit: Payer: PPO | Attending: Cardiology | Admitting: *Deleted

## 2023-02-06 DIAGNOSIS — I482 Chronic atrial fibrillation, unspecified: Secondary | ICD-10-CM | POA: Diagnosis not present

## 2023-02-06 DIAGNOSIS — Z7901 Long term (current) use of anticoagulants: Secondary | ICD-10-CM

## 2023-02-06 LAB — POCT INR: INR: 2.1 (ref 2.0–3.0)

## 2023-02-06 NOTE — Patient Instructions (Signed)
Description   Continue taking 1.5 tablets every day except 1 tablet Monday and Friday. Repeat INR in 6 weeks. Anticoagulation Clinic [phone] or [phone]

## 2023-02-26 ENCOUNTER — Ambulatory Visit: Payer: PPO

## 2023-03-06 ENCOUNTER — Ambulatory Visit (INDEPENDENT_AMBULATORY_CARE_PROVIDER_SITE_OTHER): Payer: PPO

## 2023-03-06 DIAGNOSIS — I482 Chronic atrial fibrillation, unspecified: Secondary | ICD-10-CM | POA: Diagnosis not present

## 2023-03-06 LAB — CUP PACEART REMOTE DEVICE CHECK
Battery Remaining Longevity: 4 mo
Battery Remaining Percentage: 3 %
Battery Voltage: 2.69 V
Brady Statistic RV Percent Paced: 60 %
Date Time Interrogation Session: 20240523040317
Implantable Lead Connection Status: 753985
Implantable Lead Implant Date: 20130419
Implantable Lead Location: 753860
Implantable Pulse Generator Implant Date: 20130419
Lead Channel Impedance Value: 410 Ohm
Lead Channel Pacing Threshold Amplitude: 0.75 V
Lead Channel Pacing Threshold Pulse Width: 0.5 ms
Lead Channel Sensing Intrinsic Amplitude: 12 mV
Lead Channel Setting Pacing Amplitude: 2.5 V
Lead Channel Setting Pacing Pulse Width: 0.5 ms
Lead Channel Setting Sensing Sensitivity: 2.5 mV
Pulse Gen Model: 1210
Pulse Gen Serial Number: 7313697

## 2023-03-10 ENCOUNTER — Other Ambulatory Visit: Payer: Self-pay | Admitting: Cardiovascular Disease

## 2023-03-10 DIAGNOSIS — Z7901 Long term (current) use of anticoagulants: Secondary | ICD-10-CM

## 2023-03-10 DIAGNOSIS — I482 Chronic atrial fibrillation, unspecified: Secondary | ICD-10-CM

## 2023-03-11 NOTE — Addendum Note (Signed)
Addended by: Geralyn Flash D on: 03/11/2023 04:59 PM   Modules accepted: Level of Service

## 2023-03-11 NOTE — Progress Notes (Signed)
Remote pacemaker transmission.   

## 2023-03-20 ENCOUNTER — Ambulatory Visit: Payer: PPO | Attending: Cardiology | Admitting: *Deleted

## 2023-03-20 DIAGNOSIS — Z7901 Long term (current) use of anticoagulants: Secondary | ICD-10-CM | POA: Diagnosis not present

## 2023-03-20 DIAGNOSIS — I482 Chronic atrial fibrillation, unspecified: Secondary | ICD-10-CM | POA: Diagnosis not present

## 2023-03-20 LAB — POCT INR: INR: 2.6 (ref 2.0–3.0)

## 2023-03-20 NOTE — Patient Instructions (Addendum)
Description   Continue taking 1.5 tablets every day except 1 tablet Monday and Friday. Repeat INR in 5 weeks with MD appt. Anticoagulation Clinic [phone] or [phone]

## 2023-03-26 NOTE — Progress Notes (Signed)
Remote pacemaker transmission.   

## 2023-04-03 ENCOUNTER — Ambulatory Visit (HOSPITAL_COMMUNITY): Payer: PPO | Attending: Cardiovascular Disease

## 2023-04-03 DIAGNOSIS — I5042 Chronic combined systolic (congestive) and diastolic (congestive) heart failure: Secondary | ICD-10-CM | POA: Insufficient documentation

## 2023-04-03 DIAGNOSIS — I4811 Longstanding persistent atrial fibrillation: Secondary | ICD-10-CM

## 2023-04-03 LAB — ECHOCARDIOGRAM COMPLETE
Area-P 1/2: 3.28 cm2
P 1/2 time: 507 msec
S' Lateral: 3.8 cm

## 2023-04-07 ENCOUNTER — Ambulatory Visit (INDEPENDENT_AMBULATORY_CARE_PROVIDER_SITE_OTHER): Payer: PPO

## 2023-04-07 DIAGNOSIS — I482 Chronic atrial fibrillation, unspecified: Secondary | ICD-10-CM

## 2023-04-08 ENCOUNTER — Other Ambulatory Visit: Payer: Self-pay | Admitting: Cardiovascular Disease

## 2023-04-08 DIAGNOSIS — I5023 Acute on chronic systolic (congestive) heart failure: Secondary | ICD-10-CM

## 2023-04-08 LAB — CUP PACEART REMOTE DEVICE CHECK
Battery Remaining Longevity: 3 mo
Battery Remaining Percentage: 2 %
Battery Voltage: 2.68 V
Brady Statistic RV Percent Paced: 60 %
Date Time Interrogation Session: 20240622062818
Implantable Lead Connection Status: 753985
Implantable Lead Implant Date: 20130419
Implantable Lead Location: 753860
Implantable Pulse Generator Implant Date: 20130419
Lead Channel Impedance Value: 400 Ohm
Lead Channel Pacing Threshold Amplitude: 0.75 V
Lead Channel Pacing Threshold Pulse Width: 0.5 ms
Lead Channel Sensing Intrinsic Amplitude: 7.5 mV
Lead Channel Setting Pacing Amplitude: 2.5 V
Lead Channel Setting Pacing Pulse Width: 0.5 ms
Lead Channel Setting Sensing Sensitivity: 2.5 mV
Pulse Gen Model: 1210
Pulse Gen Serial Number: 7313697

## 2023-04-24 ENCOUNTER — Ambulatory Visit (INDEPENDENT_AMBULATORY_CARE_PROVIDER_SITE_OTHER): Payer: PPO

## 2023-04-24 ENCOUNTER — Ambulatory Visit: Payer: PPO | Attending: Cardiovascular Disease | Admitting: Cardiovascular Disease

## 2023-04-24 ENCOUNTER — Encounter: Payer: Self-pay | Admitting: Cardiovascular Disease

## 2023-04-24 VITALS — BP 112/68 | HR 68 | Ht 69.0 in | Wt 203.0 lb

## 2023-04-24 DIAGNOSIS — D6869 Other thrombophilia: Secondary | ICD-10-CM

## 2023-04-24 DIAGNOSIS — I482 Chronic atrial fibrillation, unspecified: Secondary | ICD-10-CM

## 2023-04-24 DIAGNOSIS — Z7901 Long term (current) use of anticoagulants: Secondary | ICD-10-CM

## 2023-04-24 DIAGNOSIS — I2510 Atherosclerotic heart disease of native coronary artery without angina pectoris: Secondary | ICD-10-CM

## 2023-04-24 DIAGNOSIS — J90 Pleural effusion, not elsewhere classified: Secondary | ICD-10-CM

## 2023-04-24 DIAGNOSIS — I5022 Chronic systolic (congestive) heart failure: Secondary | ICD-10-CM | POA: Diagnosis not present

## 2023-04-24 DIAGNOSIS — Z950 Presence of cardiac pacemaker: Secondary | ICD-10-CM | POA: Diagnosis not present

## 2023-04-24 DIAGNOSIS — I442 Atrioventricular block, complete: Secondary | ICD-10-CM | POA: Diagnosis not present

## 2023-04-24 DIAGNOSIS — Z79899 Other long term (current) drug therapy: Secondary | ICD-10-CM | POA: Diagnosis not present

## 2023-04-24 DIAGNOSIS — R931 Abnormal findings on diagnostic imaging of heart and coronary circulation: Secondary | ICD-10-CM | POA: Diagnosis not present

## 2023-04-24 DIAGNOSIS — I4811 Longstanding persistent atrial fibrillation: Secondary | ICD-10-CM | POA: Diagnosis not present

## 2023-04-24 DIAGNOSIS — I10 Essential (primary) hypertension: Secondary | ICD-10-CM

## 2023-04-24 DIAGNOSIS — G459 Transient cerebral ischemic attack, unspecified: Secondary | ICD-10-CM

## 2023-04-24 DIAGNOSIS — E7800 Pure hypercholesterolemia, unspecified: Secondary | ICD-10-CM

## 2023-04-24 DIAGNOSIS — I6523 Occlusion and stenosis of bilateral carotid arteries: Secondary | ICD-10-CM

## 2023-04-24 LAB — POCT INR: INR: 2 (ref 2.0–3.0)

## 2023-04-24 MED ORDER — SACUBITRIL-VALSARTAN 24-26 MG PO TABS: 1.0000 | ORAL_TABLET | Freq: Two times a day (BID) | ORAL | 1 refills | Status: DC

## 2023-04-24 NOTE — Progress Notes (Signed)
Patient ID: Benjamin Mckay, male   DOB: [birth year], 77 y.o.   MRN: [id number]     Cardiology Office Note    Date:  04/27/2023   ID:  Benjamin Mckay, DOB November 17, 1945, MRN [id number]  PCP:  Olive Bass, FNP  Cardiologist:   Thurmon Fair, MD   Chief complaint: AFib, Pacemaker check   History of Present Illness:  Benjamin Mckay is a 77 y.o. male with complex cardiac history (intermittent complete heart block-pacemaker dependent, chronic atrial fibrillation versus atrial standstill, history of sustained ventricular tachycardia, coronary artery disease with history of inferior wall scar due to myocardial infarction, renal and carotid artery stenosis), amiodarone induced hyperthyroidism (now quiescent) and a recurrent right pleural effusion (s/p pleurodesis June 2017).  He has a single-chamber pacemaker that was implanted many years ago, but surprisingly has had documented sinus rhythm on at least 1 occasion in the last few years (see ECG 01/14/2022).  He does have an atrial lead in place which was capped had a generator change out in 2004.  He is doing well from a cardiac point of view.  He denies shortness of breath with usual activities including playin changeg golf, overall NYHA functional class I.  He does not have lower extremity edema while taking a low-dose of loop diuretic (20-40 mg a day; takes the higher dose of furosemide on the average once a week).  His blood pressure is well-controlled.  He has not had angina or aggressive activity.  He denies orthopnea, PND, palpitations, dizziness, syncope, focal neurological events, falls or bleeding problems.  He is on chronic warfarin anticoagulation.  He has symptoms of an enlarged prostate.  Pacemaker interrogation shows normal functioning but the estimated generator longevity is now only 3.4 months.  He has 59% ventricular pacing.  As always his device shows occasional episodes of high ventricular response and for the most part to be  atrial fibrillation with RVR (the QRS complex is similar during atrial fibrillation with controlled rates).  Symptoms the rhythm is regular and may represent atrial flutter with 2: 1 AV block or may even represent sinus tachycardia/atrial tachycardia.  Sometimes his native rhythm appears very regular (See ECG from 12/19/2017 - AFlutter with 2:1 AV block, 12/11/2016 - accelerated junctional  or ventricular rhythm, 01/14/2022 - sinus rhythm).  His ECG is unchanged and shows 100% ventricular paced rhythm with a very broad paced QRS complex at 204 ms.  His QTc is massively prolonged at 493 ms.  Despite what appears to be stable clinical status his echocardiogram 04/03/2023 shows a reduction in LV systolic function.  LVEF which had been mildly depressed at 45% by echo in 2017 and is now down to 30-35%.  There is expected hypokinesis of the basal inferior and inferoseptal segments, where he has had a previous myocardial infarction.  Right ventricular systolic function is also described as moderately reduced but he has normal calculated systolic PA pressure.  There are no significant valvular abnormalities.  Most recent LDL cholesterol was good at 72, albeit a little higher than the year before when it was 54.  He is compliant with INR monitoring and today was therapeutic at 2.0.  Very surprisingly, at a previous appointment in April 2023 his electrocardiogram showed a period of clear-cut normal sinus rhythm.  He had been felt to have atrial standstill and/or atrial fibrillation for years now.  He has a single-chamber pacemaker for this reason.  This probably explains why he sometimes has episodes of regular rhythm without  change in QRS morphology recorded by his device.   Past Medical History:  Diagnosis Date   Atrial fibrillation (HCC)    Cardiomyopathy, ischemic 11/07/2013   Cataract    bil cataracts removed   CHB (complete heart block) (HCC) 11/06/2013   Chronic combined systolic and diastolic CHF  (congestive heart failure) (HCC)    Clotting disorder (HCC)    TIA , cva - PT ON COUMADINE   Colon polyps    Colon polyps    Congenital heart block    Constipation    CVA (cerebral infarction)    Diverticulosis    Esophageal stricture    Family history of adverse reaction to anesthesia    Heart murmur    Hyperlipidemia    Hypertension    Hyperthyroidism 11/16/2015   Lymphoma (HCC) 1998   of colon    Presence of permanent cardiac pacemaker    [employer]. [employer]  pt.states he is totally dependent on pacemaker   Shortness of breath dyspnea    Pulmonary Effusion   Stroke (HCC)    TIA, cva   Umbilical hernia    Urinary frequency     Past Surgical History:  Procedure Laterality Date   APPENDECTOMY  1953   CARDIAC CATHETERIZATION  01/06/2003   Recommend medical therapy   CARDIOVASCULAR STRESS TEST  07/16/2012   Mild-moderate perfusion defect seen in Basal inferior, Mid inferior, and Apica lateral consistent with infarct/scar. No scintigraphic evidence for inducible myocardial ischemia. No ECG changes. EKG negative for ischemia.   CAROTID DOPPLER  11/04/2012   Proximal Rt ICA 50-99% diameter reduction; Lft Bulb demonstrated mild amount homogeneous plaque-not hemodynamically significant; Lft ICA-normal patency.   CHEST TUBE INSERTION Right 03/15/2016   Procedure: INSERTION PLEURAL DRAINAGE CATHETER;  Surgeon: Kerin Perna, MD;  Location: [facility] OR;  Service: Thoracic;  Laterality: Right;   COLECTOMY     for lymphoma   COLONOSCOPY     neck fusion     PACEMAKER GENERATOR CHANGE  01/31/2012   [employer] Jude Med Accent DR RF model [id number] serial [id number]   PACEMAKER PLACEMENT     replaced 3 x   PERMANENT PACEMAKER GENERATOR CHANGE N/A 01/31/2012   Procedure: PERMANENT PACEMAKER GENERATOR CHANGE;  Surgeon: Thurmon Fair, MD;  Location: [facility] CATH LAB;    PLEURADESIS N/A 04/02/2016   Procedure: Cheron Every;  Surgeon: Kerin Perna, MD;  Location: [facility] OR;  Service: Thoracic;  Laterality: N/A;   REMOVAL OF  PLEURAL DRAINAGE CATHETER Right 07/26/2016   Procedure: REMOVAL OF PLEURAL DRAINAGE CATHETER;  Surgeon: Kerin Perna, MD;  Location: [facility] OR;  Service: Thoracic;  Laterality: Right;   TONSILLECTOMY     TRANSTHORACIC ECHOCARDIOGRAM  11/04/2012   EF 35-40%, systolic function moderately reduced, mild regurg of the aortic and mitral valves.   VIDEO ASSISTED THORACOSCOPY Right 04/02/2016   Procedure: Right VIDEO ASSISTED THORACOSCOPY with Biopsies and drainage pleural effusion;  Surgeon: Kerin Perna, MD;  Location: [facility] OR;  Service: Thoracic;  Laterality: Right;    Current Medications: Outpatient Medications Prior to Visit  Medication Sig Dispense Refill   Evolocumab (REPATHA SURECLICK) 140 MG/ML SOAJ INJECT 140 MG (CONTENTS OF 1 INJECTOR) INTO THE SKIN EVERY 14 (FOURTEEN) DAYS 6 mL 1   fluticasone (FLONASE) 50 MCG/ACT nasal spray SPRAY 2 SPRAYS INTO EACH NOSTRIL EVERY DAY 48 mL 0   furosemide (LASIX) 20 MG tablet TAKE 1 TO 2 TABLETS BY MOUTH DAILY AS DIRECTED 180 tablet 3   metoprolol succinate (TOPROL-XL) 25 MG 24  hr tablet TAKE 1 TABLET BY MOUTH TWICE A DAY 180 tablet 3   pravastatin (PRAVACHOL) 80 MG tablet TAKE 1 TABLET BY MOUTH EVERY DAY 90 tablet 3   tamsulosin (FLOMAX) 0.4 MG CAPS capsule Take 0.4 mg by mouth at bedtime.     traZODone (DESYREL) 50 MG tablet TAKE 1/2 TO 1 TABLET BY MOUTH AT BEDTIME AS NEEDED FOR SLEEP 90 tablet 1   warfarin (COUMADIN) 2.5 MG tablet TAKE 1 TO 1.5 TABLETS BY MOUTH DAILY AS DIRECTED BY THE COUMADIN CLINIC 100 tablet 0   amLODipine (NORVASC) 2.5 MG tablet Take 1 tablet (2.5 mg total) by mouth daily. 90 tablet 3   acetaminophen (TYLENOL) 500 MG tablet Take 1,000 mg by mouth every 6 (six) hours as needed for moderate pain.  (Patient not taking: Reported on 04/24/2023)     No facility-administered medications prior to visit.     Allergies:   Statins, Adhesive [tape], Latex, and Penicillins   Social History   Socioeconomic History   Marital status: Married     Spouse name: Research officer, political party   Number of children: 3   Years of education: 16   Highest education level: Not on file  Occupational History   Occupation: Retired  Tobacco Use   Smoking status: Never   Smokeless tobacco: Never  Vaping Use   Vaping status: Never Used  Substance and Sexual Activity   Alcohol use: No    Alcohol/week: 0.0 standard drinks of alcohol   Drug use: No   Sexual activity: Yes  Other Topics Concern   Not on file  Social History Narrative   Fun: Golf when he can   Consumes caffeine 2 cups per day     Social Determinants of Health   Financial Resource Strain: Low Risk  (08/07/2020)   Overall Financial Resource Strain (CARDIA)    Difficulty of Paying Living Expenses: Not hard at all  Food Insecurity: No Food Insecurity (08/07/2020)   Hunger Vital Sign    Worried About Running Out of Food in the Last Year: Never true    Ran Out of Food in the Last Year: Never true  Transportation Needs: No Transportation Needs (08/07/2020)   PRAPARE - Administrator, Civil Service (Medical): No    Lack of Transportation (Non-Medical): No  Physical Activity: Sufficiently Active (08/07/2020)   Exercise Vital Sign    Days of Exercise per Week: 5 days    Minutes of Exercise per Session: 30 min  Stress: No Stress Concern Present (08/07/2020)   [employer] of Occupational Health - Occupational Stress Questionnaire    Feeling of Stress : Not at all  Social Connections: Socially Integrated (08/07/2020)   Social Connection and Isolation Panel [NHANES]    Frequency of Communication with Friends and Family: More than three times a week    Frequency of Social Gatherings with Friends and Family: More than three times a week    Attends Religious Services: More than 4 times per year    Active Member of [employer] or Organizations: Yes    Attends Engineer, structural: More than 4 times per year    Marital Status: Married     Family History:  The patient's  family history includes Diabetes in his maternal grandmother; Heart disease in his mother; Prostate cancer in his father; Stroke in his mother.   ROS:   Please see the history of present illness.    ROS all other systems are reviewed and are negative  PHYSICAL EXAM:   VS:  BP 112/68 (BP Location: Left Arm, Patient Position: Sitting, Cuff Size: Large)   Pulse 68   Ht 5\' 9"  (1.753 m)   Wt 203 lb (92.1 kg)   SpO2 96%   BMI 29.98 kg/m      General: Alert, oriented x3, no distress, healthy left subclavian PM site Head: no evidence of trauma, PERRL, EOMI, no exophtalmos or lid lag, no myxedema, no xanthelasma; normal ears, nose and oropharynx Neck: normal jugular venous pulsations and no hepatojugular reflux; brisk carotid pulses without delay and no carotid bruits Chest: reduced breath sounds and dullness to percussion R base (chronic since pleurodesis) Cardiovascular: normal position and quality of the apical impulse, regular rhythm, normal first and second heart sounds, no murmurs, rubs or gallops Abdomen: no tenderness or distention, no masses by palpation, no abnormal pulsatility or arterial bruits, normal bowel sounds, no hepatosplenomegaly Extremities: no clubbing, cyanosis or edema; 2+ radial, ulnar and brachial pulses bilaterally; 2+ right femoral, posterior tibial and dorsalis pedis pulses; 2+ left femoral, posterior tibial and dorsalis pedis pulses; no subclavian or femoral bruits Neurological: grossly nonfocal Psych: Normal mood and affect      Wt Readings from Last 3 Encounters:  04/24/23 203 lb (92.1 kg)  12/02/22 205 lb (93 kg)  10/17/22 208 lb 3.2 oz (94.4 kg)      Studies/Labs Reviewed:  Cardiac catheterization 01/07/2003;  The left anterior descending artery had irregularities throughout the  proximal third without high grade stenosis.  There was an area of at least  70% narrowing on worst views at the junction of the proximal and mid third  at the level of the  small third diagonal branch.  This was eccentric best  seen in the LAO views, and there was good flow.  The LAD beyond this showed  40% irregularity and narrowing in the mid portion and in the junction of the  distal third.  There was good flow in the apex where the vessel bifurcated.    The first diagonal was a small vessel, thin, with an 80% ostial proximal  lesion.    The second diagonal after the SP2 was moderately large, bifurcated, and had  40% proximal narrowing.    The third diagonal was a small thin branch that arose just at the area of  70% LAD narrowing with good flow.    There was a small D branch that was normal.    The circumflex had 40% narrowing in the proximal third, and another  eccentric 40% narrowing before the small OM1.  There was another 40%  irregularity and narrowing in the mid circumflex.  The OM1 had 50% narrowing  which was relatively small.  The OM2 was very small, and the OM3 was  moderate sized, and the distal circumflex bifurcated into a large branch.  The PABG branch from the mid circumflex also gave an LA branch off with no  significant stenosis.    The right coronary was a dominant vessel.  There was approximately 40%  narrowing without catheter dampening in the ostial and very proximal  portion.  Flush injections as well as selective injections and pullbacks  were performed.  There was no catheter damping.    The remainder of the right coronary had no significant stenosis.  There was  a small-to-moderate sized PVA and small bifurcating PLA branch. Atherosclerotic heart disease, positive Cardiolite for inferior ischemia,    January 03, 2003, possibly related to pacemaker-induced left bundle-branch  block.    1. Mild-to-moderate diffuse coronary artery disease, borderline left     anterior descending lesion, asymptomatic.    1. Ejection fraction of 45% to 50% with pacer-induced left bundle-branch     block.  E patient chocardiogram  04/03/2023:    1. Left ventricular ejection fraction, by estimation, is 30 to 35%. The  left ventricle has moderately decreased function. The left ventricle  demonstrates regional wall motion abnormalities (see scoring  diagram/findings for description). There is mild  concentric left ventricular hypertrophy. Left ventricular diastolic  function could not be evaluated. There is severe hypokinesis of the left  ventricular, basal-mid inferoseptal wall. There is moderate hypokinesis of  the left ventricular, basal-mid  inferior wall.   2. Right ventricular systolic function is moderately reduced. The right  ventricular size is mildly enlarged. There is normal pulmonary artery  systolic pressure.   3. Left atrial size was moderately dilated.   4. The mitral valve is normal in structure. Trivial mitral valve  regurgitation. No evidence of mitral stenosis.   5. The aortic valve is tricuspid. There is mild calcification of the  aortic valve. There is mild thickening of the aortic valve. Aortic valve  regurgitation is mild. Aortic valve sclerosis is present, with no evidence  of aortic valve stenosis.   6. Aortic dilatation noted. There is borderline dilatation of the  ascending aorta, measuring 38 mm.   7. The inferior vena cava is normal in size with greater than 50%  respiratory variability, suggesting right atrial pressure of 3 mmHg.   Comparison(s): Changes from prior study are noted. LV with global  hypokinesis as well as focal wall motion abnormalities in the inferior and  inferoseptal walls. EF 30-35%.   EKG:  EKG is ordered today.  Shows background ventricular fibrillation related to 100% ventricular paced rhythm.  Broad paced QRS at 204 ms, QTc 493 ms.    Previous electrogram from 01/14/2022 shows a period of sinus rhythm followed by a ventricular paced beat and then possible atrial fibrillation, left bundle branch block with QRS duration 148 ms and left axis deviation, QTc normal  at 416 ms.  Recent Labs: 08/06/2022: ALT 15; BUN 15; Creatinine, Ser 1.10; Hemoglobin 15.0; Platelets 183.0; Potassium 4.5; Sodium 136   Lipid Panel    Component Value Date/Time   CHOL 130 08/06/2022 1050   CHOL 102 02/23/2020 0909   TRIG 77.0 08/06/2022 1050   HDL 42.30 08/06/2022 1050   HDL 43 02/23/2020 0909   CHOLHDL 3 08/06/2022 1050   VLDL 15.4 08/06/2022 1050   LDLCALC 72 08/06/2022 1050   LDLCALC 46 02/23/2020 0909     ASSESSMENT:    1. Chronic systolic heart failure (HCC)   2. Coronary artery disease involving native coronary artery of native heart without angina pectoris   3. CHB (complete heart block) (HCC)   4. Longstanding persistent atrial fibrillation (HCC)   5. Primary hypertension   6. Pacemaker   7. Pleural effusion, right s/p pleurodesis   8. Acquired thrombophilia (HCC)   9. Bilateral carotid artery stenosis   10. Hypercholesterolemia   11. Medication management   12. Abnormal echocardiogram      PLAN:  In order of problems listed above:  CHF: NYHA functional class I, clinically euvolemic taking a very low-dose of diuretic.  Dry weight appears to be 2102 pounds, lighter today.  However, LVEF is down to 30-35%, marked decrease since 2017.  He has a longstanding history of cardiomyopathy, but reviewing the  echo a large part of the reduction in LV function seems to be due to pacing related LV dyssynchrony.  Need to exclude due to new areas of ischemia/infarction.  Will schedule him for his PET scan.  Start Entresto. CAD: He does not have angina pectoris and has not required cardiac catheterization since his evaluation in 2004.  At that time he had diffuse coronary atherosclerosis and three-vessel coronary artery calcification with no significant coronary lesions.  His previous nuclear stress test have documented an old inferior myocardial infarction.  Need to reevaluate for new areas of ischemia in view of his depressed LV function.  He has repeatedly  described a preference for less invasive evaluations. CHB: He has been labeled as having congenital heart block with junctional escape rhythm, but we have clearly documented sinus rhythm with 1: 1 AV conduction, atrial flutter with variable AV block or atrial fibrillation with rapid ventricular response.  Interestingly, his son also bears a diagnosis of congenital complete heart block.  Although in the past she has had periods of complete heart block, most often rhythm is atrial fibrillation, controlled rate or with slow ventricular response and ventricular pacing.  He is not pacemaker dependent and requires roughly 50-60% ventricular pacing.  ECG last April shows clear evidence of sinus rhythm with 1:1 AV conduction, although it was felt that he has permanent atrial fibrillation.  This explains why sometimes his device will record regular tachycardia, probably sinus tachycardia or atrial flutter with 2: 1 AV block.  His QRS is always very broad (negative QRS 150 ms, paced QRS 200 ms). On one occasion years ago he had a near syncopal event and had regular wide complex rhythm, presumed to be VT.  Retrospectively one wonders whether that was a vasovagal event/hypotension (occurred outside in the heat at a golf tournament).   AFib: He has long periods of persistent atrial fibrillation, so he received a single chamber pacemaker, but surprisingly has documented sinus rhythm as recently as April 2023.  The presence or absence of atrial fibrillation versus irregular rhythm appears to have no impact on his functional status or symptoms.  CHADSVasc 5 (age 12, CHF, CAD, HTN).  On appropriate anticoagulation.  No history of embolic events.  The current dose of beta-blocker seems to be maintaining a reasonable balance between excessive ventricular pacing which could cause worsening heart failure versus control of the episodes of tachyarrhythmia. HTN: Was previously elevated and responded well to amlodipine, but in view of the  new information about decreased LVEF we will switch to [facility].  Continue metoprolol succinate. PPM: Single-chamber ventricular device.  Does have a very old atrial lead in place that has been capped since 2004.  Anticipate ERI in just over 3 months we are doing monthly downloads for battery check.  Increase the frequency of remote downloads to monthly. Upgrade to a dual-chamber or CRT device has been discussed but felt to be high-risk due to his multiple previous surgical procedures and increased risk of infection.  She may need to reconsider this in view of the new information about LV function. Right lung consolidation: History of previous right pleurodesis. Anticoagulation: No bleeding complications.  She therapeutic INR better than 90% of the time of the last year.   Renal artery stenosis: Most recent creatinine was 1.1, will recheck labs. Carotid stenosis: Asymptomatic.  He has declined ultrasound monitoring. HLP: Target LDL less than 70.  Very close to target range on combination pravastatin (he did not tolerate more potent statins) and Repatha.  Medication Adjustments/Labs and Tests Ordered: Current medicines are reviewed at length with the patient today.  Concerns regarding medicines are outlined above.  Medication changes, Labs and Tests ordered today are listed in the Patient Instructions below. Patient Instructions  Medication Instructions:  STOP AMLODIPINE START ENTRESTO 24/26 two times a day *If you need a refill on your cardiac medications before your next appointment, please call your pharmacy*   Lab Work: BMP- Please return for Blood Work in 2 weeks. No appointment needed, lab here at the office is open Monday-Friday from 8AM to 4PM and closed daily for lunch from 12:45-1:45.   If you have labs (blood work) drawn today and your tests are completely normal, you will receive your results only by: MyChart Message (if you have MyChart) OR A paper copy in the mail If you have  any lab test that is abnormal or we need to change your treatment, we will call you to review the results.   Testing/Procedures: How to Prepare for Your Cardiac PET/CT Stress Test:  1. Please do not take these medications before your test:   Medications that may interfere with the cardiac pharmacological stress agent (ex. nitrates - including erectile dysfunction medications, isosorbide mononitrate, tamulosin or beta-blockers) the day of the exam. (Erectile dysfunction medication should be held for at least 72 hrs prior to test) Theophylline containing medications for 12 hours. Dipyridamole 48 hours prior to the test. Your remaining medications may be taken with water.  2. Nothing to eat or drink, except water, 3 hours prior to arrival time.   NO caffeine/decaffeinated products, or chocolate 12 hours prior to arrival.  3. NO perfume, cologne or lotion  4. Total time is 1 to 2 hours; you may want to bring reading material for the waiting time.  5. Please report to Radiology at the [facility] Main Entrance 30 minutes early for your test.  [address] [place], [place] [address]  Diabetic Preparation:  Hold oral medications. You may take NPH and Lantus insulin. Do not take Humalog or Humulin R (Regular Insulin) the day of your test. Check blood sugars prior to leaving the house. If able to eat breakfast prior to 3 hour fasting, you may take all medications, including your insulin, Do not worry if you miss your breakfast dose of insulin - start at your next meal.  IF YOU THINK YOU MAY BE PREGNANT, OR ARE NURSING PLEASE INFORM THE TECHNOLOGIST.  In preparation for your appointment, medication and supplies will be purchased.  Appointment availability is limited, so if you need to cancel or reschedule, please call the Radiology Department at [phone]  24 hours in advance to avoid a cancellation fee of $100.00  What to Expect After you Arrive:  Once you arrive and  check in for your appointment, you will be taken to a preparation room within the Radiology Department.  A technologist or Nurse will obtain your medical history, verify that you are correctly prepped for the exam, and explain the procedure.  Afterwards,  an IV will be started in your arm and electrodes will be placed on your skin for EKG monitoring during the stress portion of the exam. Then you will be escorted to the PET/CT scanner.  There, staff will get you positioned on the scanner and obtain a blood pressure and EKG.  During the exam, you will continue to be connected to the EKG and blood pressure machines.  A small, safe amount of a radioactive tracer will be  injected in your IV to obtain a series of pictures of your heart along with an injection of a stress agent.    After your Exam:  It is recommended that you eat a meal and drink a caffeinated beverage to counter act any effects of the stress agent.  Drink plenty of fluids for the remainder of the day and urinate frequently for the first couple of hours after the exam.  Your doctor will inform you of your test results within 7-10 business days.  For more information and frequently asked questions, please visit our website : [email or web address]  For questions about your test or how to prepare for your test, please call: Rockwell Alexandria, Cardiac Imaging Nurse Navigator  Larey Brick, Cardiac Imaging Nurse Navigator Office: [phone]    Follow-Up: At [facility], you and your health needs are our priority.  As part of our continuing mission to provide you with exceptional heart care, we have created designated Provider Care Teams.  These Care Teams include your primary Cardiologist (physician) and Advanced Practice Providers (APPs -  Physician Assistants and Nurse Practitioners) who all work together to provide you with the care you need, when you need it.  We recommend signing up for the patient portal called  "MyChart".  Sign up information is provided on this After Visit Summary.  MyChart is used to connect with patients for Virtual Visits (Telemedicine).  Patients are able to view lab/test results, encounter notes, upcoming appointments, etc.  Non-urgent messages can be sent to your provider as well.   To learn more about what you can do with MyChart, go to [email or web address].    Your next appointment:   2-3 weeks with PharmD  3 months with Dr Royann Shivers     Signed, Thurmon Fair, MD  04/27/2023 2:33 PM    [facility] [address] [place], [place], [place]  [address] Phone: [phone]; Fax: [phone]

## 2023-04-24 NOTE — Patient Instructions (Signed)
Description   Take 2 tablets today and then continue taking 1.5 tablets every day except 1 tablet Monday and Friday.  Repeat INR in 6 weeks Anticoagulation Clinic [phone] or [phone]

## 2023-04-24 NOTE — Patient Instructions (Signed)
Medication Instructions:  STOP AMLODIPINE START ENTRESTO 24/26 two times a day *If you need a refill on your cardiac medications before your next appointment, please call your pharmacy*   Lab Work: BMP- Please return for Blood Work in 2 weeks. No appointment needed, lab here at the office is open Monday-Friday from 8AM to 4PM and closed daily for lunch from 12:45-1:45.   If you have labs (blood work) drawn today and your tests are completely normal, you will receive your results only by: MyChart Message (if you have MyChart) OR A paper copy in the mail If you have any lab test that is abnormal or we need to change your treatment, we will call you to review the results.   Testing/Procedures: How to Prepare for Your Cardiac PET/CT Stress Test:  1. Please do not take these medications before your test:   Medications that may interfere with the cardiac pharmacological stress agent (ex. nitrates - including erectile dysfunction medications, isosorbide mononitrate, tamulosin or beta-blockers) the day of the exam. (Erectile dysfunction medication should be held for at least 72 hrs prior to test) Theophylline containing medications for 12 hours. Dipyridamole 48 hours prior to the test. Your remaining medications may be taken with water.  2. Nothing to eat or drink, except water, 3 hours prior to arrival time.   NO caffeine/decaffeinated products, or chocolate 12 hours prior to arrival.  3. NO perfume, cologne or lotion  4. Total time is 1 to 2 hours; you may want to bring reading material for the waiting time.  5. Please report to Radiology at the [facility] Main Entrance 30 minutes early for your test.  [address] [place], [place] [address]  Diabetic Preparation:  Hold oral medications. You may take NPH and Lantus insulin. Do not take Humalog or Humulin R (Regular Insulin) the day of your test. Check blood sugars prior to leaving the house. If able to eat breakfast  prior to 3 hour fasting, you may take all medications, including your insulin, Do not worry if you miss your breakfast dose of insulin - start at your next meal.  IF YOU THINK YOU MAY BE PREGNANT, OR ARE NURSING PLEASE INFORM THE TECHNOLOGIST.  In preparation for your appointment, medication and supplies will be purchased.  Appointment availability is limited, so if you need to cancel or reschedule, please call the Radiology Department at [phone]  24 hours in advance to avoid a cancellation fee of $100.00  What to Expect After you Arrive:  Once you arrive and check in for your appointment, you will be taken to a preparation room within the Radiology Department.  A technologist or Nurse will obtain your medical history, verify that you are correctly prepped for the exam, and explain the procedure.  Afterwards,  an IV will be started in your arm and electrodes will be placed on your skin for EKG monitoring during the stress portion of the exam. Then you will be escorted to the PET/CT scanner.  There, staff will get you positioned on the scanner and obtain a blood pressure and EKG.  During the exam, you will continue to be connected to the EKG and blood pressure machines.  A small, safe amount of a radioactive tracer will be injected in your IV to obtain a series of pictures of your heart along with an injection of a stress agent.    After your Exam:  It is recommended that you eat a meal and drink a caffeinated beverage  to counter act any effects of the stress agent.  Drink plenty of fluids for the remainder of the day and urinate frequently for the first couple of hours after the exam.  Your doctor will inform you of your test results within 7-10 business days.  For more information and frequently asked questions, please visit our website : [email or web address]  For questions about your test or how to prepare for your test, please call: Rockwell Alexandria, Cardiac Imaging Nurse Navigator   Larey Brick, Cardiac Imaging Nurse Navigator Office: [phone]    Follow-Up: At [facility], you and your health needs are our priority.  As part of our continuing mission to provide you with exceptional heart care, we have created designated Provider Care Teams.  These Care Teams include your primary Cardiologist (physician) and Advanced Practice Providers (APPs -  Physician Assistants and Nurse Practitioners) who all work together to provide you with the care you need, when you need it.  We recommend signing up for the patient portal called "MyChart".  Sign up information is provided on this After Visit Summary.  MyChart is used to connect with patients for Virtual Visits (Telemedicine).  Patients are able to view lab/test results, encounter notes, upcoming appointments, etc.  Non-urgent messages can be sent to your provider as well.   To learn more about what you can do with MyChart, go to [email or web address].    Your next appointment:   2-3 weeks with PharmD  3 months with Dr Royann Shivers

## 2023-04-25 NOTE — Progress Notes (Signed)
Remote pacemaker transmission.   

## 2023-04-25 NOTE — Addendum Note (Signed)
Addended by: Geralyn Flash D on: 04/25/2023 04:54 PM   Modules accepted: Level of Service

## 2023-04-30 ENCOUNTER — Other Ambulatory Visit: Payer: Self-pay | Admitting: Cardiovascular Disease

## 2023-04-30 DIAGNOSIS — I25119 Atherosclerotic heart disease of native coronary artery with unspecified angina pectoris: Secondary | ICD-10-CM

## 2023-04-30 DIAGNOSIS — I6523 Occlusion and stenosis of bilateral carotid arteries: Secondary | ICD-10-CM

## 2023-05-08 ENCOUNTER — Ambulatory Visit: Payer: PPO

## 2023-05-08 DIAGNOSIS — I442 Atrioventricular block, complete: Secondary | ICD-10-CM | POA: Diagnosis not present

## 2023-05-09 DIAGNOSIS — I10 Essential (primary) hypertension: Secondary | ICD-10-CM | POA: Diagnosis not present

## 2023-05-09 DIAGNOSIS — Z79899 Other long term (current) drug therapy: Secondary | ICD-10-CM | POA: Diagnosis not present

## 2023-05-09 DIAGNOSIS — G459 Transient cerebral ischemic attack, unspecified: Secondary | ICD-10-CM | POA: Diagnosis not present

## 2023-05-14 ENCOUNTER — Other Ambulatory Visit: Payer: Self-pay | Admitting: Cardiovascular Disease

## 2023-05-14 ENCOUNTER — Other Ambulatory Visit: Payer: Self-pay | Admitting: *Deleted

## 2023-05-14 DIAGNOSIS — Z7901 Long term (current) use of anticoagulants: Secondary | ICD-10-CM

## 2023-05-14 DIAGNOSIS — I482 Chronic atrial fibrillation, unspecified: Secondary | ICD-10-CM

## 2023-05-14 MED ORDER — WARFARIN SODIUM 2.5 MG PO TABS
ORAL_TABLET | ORAL | 0 refills | Status: DC
Start: 2023-05-14 — End: 2023-06-26

## 2023-05-14 NOTE — Telephone Encounter (Signed)
Pt called to request a warfarin refill.  Afib Last INR 04/24/23 Last OV 10/17/22

## 2023-05-20 NOTE — Progress Notes (Signed)
Remote pacemaker transmission.   

## 2023-05-26 ENCOUNTER — Ambulatory Visit: Payer: PPO | Attending: Cardiology | Admitting: Student

## 2023-05-26 ENCOUNTER — Encounter: Payer: Self-pay | Admitting: Student

## 2023-05-26 ENCOUNTER — Telehealth: Payer: Self-pay | Admitting: Pharmacist

## 2023-05-26 ENCOUNTER — Other Ambulatory Visit (HOSPITAL_COMMUNITY): Payer: Self-pay

## 2023-05-26 VITALS — BP 136/74 | HR 66 | Ht 69.0 in | Wt 207.0 lb

## 2023-05-26 DIAGNOSIS — I255 Ischemic cardiomyopathy: Secondary | ICD-10-CM

## 2023-05-26 MED ORDER — EMPAGLIFLOZIN 10 MG PO TABS: 10.0000 mg | ORAL_TABLET | Freq: Every day | ORAL | 11 refills | Status: DC

## 2023-05-26 NOTE — Patient Instructions (Signed)
Changes made by your pharmacist Carmela Hurt, PharmD at today's visit:    Instructions/Changes  (what do you need to do) Your Notes  (what you did and when you did it)  Continue taking Entresto 24-26 mg twice daily    Start taking Jardiance 10 mg once a day    Start doing regular exercise - at least 15-30 min walks     Bring all of your meds, your BP cuff and your record of home blood pressures to your next appointment.    HOW TO TAKE YOUR BLOOD PRESSURE AT HOME  Rest 5 minutes before taking your blood pressure.  Don't smoke or drink caffeinated beverages for at least 30 minutes before. Take your blood pressure before (not after) you eat. Sit comfortably with your back supported and both feet on the floor (don't cross your legs). Elevate your arm to heart level on a table or a desk. Use the proper sized cuff. It should fit smoothly and snugly around your bare upper arm. There should be enough room to slip a fingertip under the cuff. The bottom edge of the cuff should be 1 inch above the crease of the elbow. Ideally, take 3 measurements at one sitting and record the average.  Important lifestyle changes to control high blood pressure  Intervention  Effect on the BP  Lose extra pounds and watch your waistline Weight loss is one of the most effective lifestyle changes for controlling blood pressure. If you're overweight or obese, losing even a small amount of weight can help reduce blood pressure. Blood pressure might go down by about 1 millimeter of mercury (mm Hg) with each kilogram (about 2.2 pounds) of weight lost.  Exercise regularly As a general goal, aim for at least 30 minutes of moderate physical activity every day. Regular physical activity can lower high blood pressure by about 5 to 8 mm Hg.  Eat a healthy diet Eating a diet rich in whole grains, fruits, vegetables, and low-fat dairy products and low in saturated fat and cholesterol. A healthy diet can lower high blood  pressure by up to 11 mm Hg.  Reduce salt (sodium) in your diet Even a small reduction of sodium in the diet can improve heart health and reduce high blood pressure by about 5 to 6 mm Hg.  Limit alcohol One drink equals 12 ounces of beer, 5 ounces of wine, or 1.5 ounces of 80-proof liquor.  Limiting alcohol to less than one drink a day for women or two drinks a day for men can help lower blood pressure by about 4 mm Hg.   If you have any questions or concerns please use My Chart to send questions or call the office at [phone]

## 2023-05-26 NOTE — Assessment & Plan Note (Signed)
Assessment: Last EF 30-35% (04/04/2023) BP is un controlled in office BP 136/74 heart rate 66 (goal <130/80)  Takes Lasix 20 mg daily and has not used any extra in past 2 weeks weight has been stable at 207 lbs Tolerates Entresto low dose well without any side effects Denies dizziness, lightheadedness, fatigue,chest pain, palpitations, or SOB.  No LEE, PND, or orthopnea. Weight is stable  and appetite has been normal Reiterated the importance of regular exercise and low salt diet   In agreement to optimize HF medications will initiate [place] is cost prohibitive - will enroll him in [facility]   Plan:  Start taking Jardiance 10 mg daily  Continue taking Entresto  24-26 mg twice daily  Patient to keep record of BP readings with heart rate and report to [place] at the next visit Patient to see PharmD in 4  weeks for follow up  Follow up lab(s): BMP in 2 weeks

## 2023-05-26 NOTE — Telephone Encounter (Signed)
ID [id number]   CARD STATUS Active   BIN [id number]   PCN [id number]   PC GROUP [id number]

## 2023-05-26 NOTE — Progress Notes (Unsigned)
Patient ID: Benjamin Mckay                 DOB: 1945/11/10                      MRN: [id number]     HPI: Benjamin Mckay is a 77 y.o. male referred by Dr. Royann Mckay to pharmacy clinic for HF medication management. PMH is significant for h complex cardiac history (intermittent complete heart block-pacemaker dependent, chronic atrial fibrillation versus atrial standstill, history of sustained ventricular tachycardia, coronary artery disease with history of inferior wall scar due to myocardial infarction, renal and carotid artery stenosis), amiodarone induced hyperthyroidism (now quiescent) and a recurrent right pleural effusion (s/p pleurodesis June 2017). echocardiogram 04/03/2023 shows a reduction in LV systolic function. LVEF which had been mildly depressed at 45% by echo in 2017 and is now down to 30-35% (04/04/2023).  Patient presented today in good spirit. Reports he tolerates low dose Entresto well without any s/e. He does not watches salt intake and doe not do any exercise. Says he is 77 and not interested in making any lifestyle changes. He has been taking Lasix  20 mg daily but did not need to take any extra from past 2 weeks. He does not check his BP at home regularly but he checked his twice in last 2 weeks. Reports it was good but does not recall exact readings.  Denies dizziness, lightheadedness, fatigue,chest pain, palpitations, or SOB. No LEE, PND, or orthopnea. Weight is stable  Appetite has been normal. Other discussion with patient today included the following: cardiac medication indications, introduction to GDMT clinic, reasoning behind medication titration, importance of medication adherence, and patient engagement.         Current CHF meds: Entresto 24-26 mg twice daily, furosemide 20 - 40 mg PRN for swelling  Previously tried:  Adherence Assessment  Do you ever forget to take your medication?  [] Yes [x] No  Do you ever skip doses due to side effects? [] Yes [x] No  Do you have  trouble affording your medicines? [x] Yes [] No  Are you ever unable to pick up your medication due to transportation difficulties? [] Yes [x] No  Do you ever stop taking your medications because you don't believe they are helping? [] Yes [x] No  Do you check your weight daily? 207lb tall 5 9'  [x] Yes [] No   Adherence strategy: pill box   Barriers to obtaining medications: cost of the brand name medications   BP goal: <130/80   Family History:  Relation Problem Comments  Mother (Deceased) Heart disease   Stroke     Father (Deceased) Prostate cancer     Maternal Grandmother (Deceased) Diabetes     Social History:  Alcohol: none Smoking: none  Recreational drug use: none  Diet: does not watch salt intake and drinks 3 Dr.Pepper per day    Exercise: golf once week    Home BP readings: checks twice in past 2 weeks does not recall exact reading reports it was good   Wt Readings from Last 3 Encounters:  05/26/23 207 lb (93.9 kg)  04/24/23 203 lb (92.1 kg)  12/02/22 205 lb (93 kg)   BP Readings from Last 3 Encounters:  05/26/23 136/74  04/24/23 112/68  12/02/22 130/72   Pulse Readings from Last 3 Encounters:  05/26/23 66  04/24/23 68  12/02/22 69    Renal function: Estimated Creatinine Clearance: 63.6 mL/min (by C-G formula based on SCr of 1.1 mg/dL).  Past Medical History:  Diagnosis Date   Atrial fibrillation (HCC)    Cardiomyopathy, ischemic 11/07/2013   Cataract    bil cataracts removed   CHB (complete heart block) (HCC) 11/06/2013   Chronic combined systolic and diastolic CHF (congestive heart failure) (HCC)    Clotting disorder (HCC)    TIA , cva - PT ON COUMADINE   Colon polyps    Colon polyps    Congenital heart block    Constipation    CVA (cerebral infarction)    Diverticulosis    Esophageal stricture    Family history of adverse reaction to anesthesia    Heart murmur    Hyperlipidemia    Hypertension    Hyperthyroidism 11/16/2015   Lymphoma (HCC)  1998   of colon    Presence of permanent cardiac pacemaker    [employer]  pt.states he is totally dependent on pacemaker   Shortness of breath dyspnea    Pulmonary Effusion   Stroke (HCC)    TIA, cva   Umbilical hernia    Urinary frequency     Current Outpatient Medications on File Prior to Visit  Medication Sig Dispense Refill   acetaminophen (TYLENOL) 500 MG tablet Take 1,000 mg by mouth every 6 (six) hours as needed for moderate pain.  (Patient not taking: Reported on 04/24/2023)     Evolocumab (REPATHA SURECLICK) 140 MG/ML SOAJ INJECT 140 MG (CONTENTS OF 1 INJECTOR) INTO THE SKIN EVERY 14 (FOURTEEN) DAYS 6 mL 3   fluticasone (FLONASE) 50 MCG/ACT nasal spray SPRAY 2 SPRAYS INTO EACH NOSTRIL EVERY DAY 48 mL 0   furosemide (LASIX) 20 MG tablet TAKE 1 TO 2 TABLETS BY MOUTH DAILY AS DIRECTED 180 tablet 3   metoprolol succinate (TOPROL-XL) 25 MG 24 hr tablet TAKE 1 TABLET BY MOUTH TWICE A DAY 180 tablet 3   pravastatin (PRAVACHOL) 80 MG tablet TAKE 1 TABLET BY MOUTH EVERY DAY 90 tablet 3   sacubitril-valsartan (ENTRESTO) 24-26 MG Take 1 tablet by mouth 2 (two) times daily. 60 tablet 1   tamsulosin (FLOMAX) 0.4 MG CAPS capsule Take 0.4 mg by mouth at bedtime.     traZODone (DESYREL) 50 MG tablet TAKE 1/2 TO 1 TABLET BY MOUTH AT BEDTIME AS NEEDED FOR SLEEP 90 tablet 1   warfarin (COUMADIN) 2.5 MG tablet TAKE 1 TO 1.5 TABLETS BY MOUTH DAILY AS DIRECTED BY THE COUMADIN CLINIC 120 tablet 0   No current facility-administered medications on file prior to visit.    Allergies  Allergen Reactions   Statins Other (See Comments)    Leg cramps.   Tolerates pravastatin.     Adhesive [Tape] Other (See Comments)    Skin irritation - please use paper tape   Latex Other (See Comments)    Skin irritation   Penicillins Rash    Has patient had a PCN reaction causing immediate rash, facial/tongue/throat swelling, SOB or lightheadedness with hypotension: Yes Has patient had a PCN reaction causing severe  rash involving mucus membranes or skin necrosis: No Has patient had a PCN reaction that required hospitalization No Has patient had a PCN reaction occurring within the last 10 years: No If all of the above answers are "NO", then may proceed with Cephalosporin use.     Cardiomyopathy, ischemic Assessment & Plan: Assessment: Last EF 30-35% (04/04/2023) BP is un controlled in office BP 136/74 heart rate 66 (goal <130/80)  Takes Lasix 20 mg daily and has not used any extra in past 2 weeks weight has been stable at  207 lbs Tolerates Entresto low dose well without any side effects Denies dizziness, lightheadedness, fatigue,chest pain, palpitations, or SOB.  No LEE, PND, or orthopnea. Weight is stable  and appetite has been normal Reiterated the importance of regular exercise and low salt diet   In agreement to optimize HF medications will initiate Benjamin Mckay is cost prohibitive - will enroll him in [facility]   Plan:  Start taking Jardiance 10 mg daily  Continue taking Entresto  24-26 mg twice daily  Patient to keep record of BP readings with heart rate and report to [place] at the next visit Patient to see PharmD in 4  weeks for follow up  Follow up lab(s): BMP in 2 weeks    Orders: -     Basic metabolic panel; Future  Other orders -     Empagliflozin; Take 1 tablet (10 mg total) by mouth daily before breakfast.  Dispense: 30 tablet; Refill: 11       Thank you   Carmela Hurt, Pharm.D [facility] A Division of [facility] [address], [place], [place] [address]  Phone: [phone]; Fax: [phone]

## 2023-05-28 ENCOUNTER — Ambulatory Visit: Payer: PPO

## 2023-05-30 ENCOUNTER — Other Ambulatory Visit: Payer: Self-pay | Admitting: Cardiovascular Disease

## 2023-06-02 DIAGNOSIS — H43812 Vitreous degeneration, left eye: Secondary | ICD-10-CM | POA: Diagnosis not present

## 2023-06-02 DIAGNOSIS — H35371 Puckering of macula, right eye: Secondary | ICD-10-CM | POA: Diagnosis not present

## 2023-06-02 DIAGNOSIS — Z961 Presence of intraocular lens: Secondary | ICD-10-CM | POA: Diagnosis not present

## 2023-06-02 DIAGNOSIS — H26493 Other secondary cataract, bilateral: Secondary | ICD-10-CM | POA: Diagnosis not present

## 2023-06-05 ENCOUNTER — Ambulatory Visit: Payer: PPO | Attending: Cardiovascular Disease | Admitting: *Deleted

## 2023-06-05 DIAGNOSIS — Z5181 Encounter for therapeutic drug level monitoring: Secondary | ICD-10-CM | POA: Diagnosis not present

## 2023-06-05 DIAGNOSIS — I482 Chronic atrial fibrillation, unspecified: Secondary | ICD-10-CM | POA: Diagnosis not present

## 2023-06-05 LAB — POCT INR: POC INR: 1.7

## 2023-06-05 NOTE — Patient Instructions (Signed)
Description   Take 2 tablets today and then START taking 1.5 tablets every day except 1 tablet  on Mondays.  Repeat INR in 3 weeks Anticoagulation Clinic [phone] or [phone]

## 2023-06-09 ENCOUNTER — Ambulatory Visit (INDEPENDENT_AMBULATORY_CARE_PROVIDER_SITE_OTHER): Payer: PPO

## 2023-06-09 DIAGNOSIS — I482 Chronic atrial fibrillation, unspecified: Secondary | ICD-10-CM

## 2023-06-09 LAB — CUP PACEART REMOTE DEVICE CHECK
Battery Remaining Longevity: 1 mo
Battery Remaining Percentage: 1 %
Battery Voltage: 2.63 V
Brady Statistic RV Percent Paced: 60 %
Date Time Interrogation Session: 20240825023647
Implantable Lead Connection Status: 753985
Implantable Lead Implant Date: 20130419
Implantable Lead Location: 753860
Implantable Pulse Generator Implant Date: 20130419
Lead Channel Impedance Value: 400 Ohm
Lead Channel Pacing Threshold Amplitude: 0.75 V
Lead Channel Pacing Threshold Pulse Width: 0.5 ms
Lead Channel Sensing Intrinsic Amplitude: 11.8 mV
Lead Channel Setting Pacing Amplitude: 2.5 V
Lead Channel Setting Pacing Pulse Width: 0.5 ms
Lead Channel Setting Sensing Sensitivity: 2.5 mV
Pulse Gen Model: 1210
Pulse Gen Serial Number: 7313697

## 2023-06-10 ENCOUNTER — Telehealth: Payer: Self-pay | Admitting: Pharmacist

## 2023-06-10 NOTE — Telephone Encounter (Signed)
Patient called and left VM stating that his BP has been in the 80's-90's systolic since being on [place]. He is also feeling a little light headed.  I called patient. States his BP has not been >100 systolic for about a week. He is peeing a lot more. I think he is dehydrated. Sherryll Burger and Jardiance can cause diuresis and furosemide is probably not needed anymore. I advised patient to hold Entresto, Jardiance and furosemide for a few days until BP is at least >110 systolic. Then resume [place] Furosemide is only to be taken if needed for swelling. Patient advised to call us if BP does not improve off of medications or it drops again after resuming. I advised that we do not want his BP to be <100 systolic.

## 2023-06-11 ENCOUNTER — Encounter (HOSPITAL_COMMUNITY): Payer: Self-pay

## 2023-06-12 ENCOUNTER — Ambulatory Visit
Admission: RE | Admit: 2023-06-12 | Discharge: 2023-06-12 | Disposition: A | Discharge status: Home / Self Care | Payer: PPO | Source: Ambulatory Visit | Attending: Cardiovascular Disease | Admitting: Cardiovascular Disease

## 2023-06-12 DIAGNOSIS — R931 Abnormal findings on diagnostic imaging of heart and coronary circulation: Secondary | ICD-10-CM | POA: Insufficient documentation

## 2023-06-12 DIAGNOSIS — J929 Pleural plaque without asbestos: Secondary | ICD-10-CM | POA: Diagnosis not present

## 2023-06-12 DIAGNOSIS — I5189 Other ill-defined heart diseases: Secondary | ICD-10-CM | POA: Insufficient documentation

## 2023-06-12 DIAGNOSIS — I2510 Atherosclerotic heart disease of native coronary artery without angina pectoris: Secondary | ICD-10-CM | POA: Insufficient documentation

## 2023-06-12 DIAGNOSIS — J9811 Atelectasis: Secondary | ICD-10-CM | POA: Diagnosis not present

## 2023-06-12 DIAGNOSIS — I700 Atherosclerosis of aorta: Secondary | ICD-10-CM | POA: Diagnosis not present

## 2023-06-12 DIAGNOSIS — J90 Pleural effusion, not elsewhere classified: Secondary | ICD-10-CM | POA: Diagnosis not present

## 2023-06-12 MED ORDER — RUBIDIUM RB82 GENERATOR (RUBYFILL)
25.0000 | PACK | Freq: Once | INTRAVENOUS | Status: AC
  Administered 2023-06-12: 24.36 via INTRAVENOUS

## 2023-06-12 MED ORDER — RUBIDIUM RB82 GENERATOR (RUBYFILL)
25.0000 | PACK | Freq: Once | INTRAVENOUS | Status: AC
  Administered 2023-06-12: 24.33 via INTRAVENOUS

## 2023-06-12 MED ORDER — REGADENOSON 0.4 MG/5ML IV SOLN
INTRAVENOUS | Status: AC
  Filled 2023-06-12: qty 5

## 2023-06-12 MED ORDER — REGADENOSON 0.4 MG/5ML IV SOLN
0.4000 mg | Freq: Once | INTRAVENOUS | Status: AC
  Administered 2023-06-12: 0.4 mg via INTRAVENOUS
  Filled 2023-06-12: qty 5

## 2023-06-12 NOTE — Progress Notes (Signed)
Patient presents for a cardiac PET stress test and tolerated procedure without incident. Patient maintained acceptable vital signs throughout the test and was offered caffeine after test.  Patient ambulated out of department with a steady gait.  

## 2023-06-13 LAB — NM PET CT CARDIAC PERFUSION MULTI W/ABSOLUTE BLOODFLOW
MBFR: 2.67
Nuc Rest EF: 36 %
Nuc Stress EF: 40 %
Peak HR: 102 {beats}/min
Rest HR: 65 {beats}/min
Rest MBF: 0.55 ml/g/min
Rest Nuclear Isotope Dose: 24.3 mCi
SRS: 5
SSS: 9
Stress MBF: 1.47 ml/g/min
Stress Nuclear Isotope Dose: 24.4 mCi
TID: 1.07

## 2023-06-17 NOTE — Telephone Encounter (Signed)
Spoke to patient today, states his BP has been improving off of Entresto and Jardiance. Home BP ~133/78 from past 4 days. He has restarted Jardiance from 1 Sept. He feels little sluggish he has been urinating a lot at night. Confirmed patient is taking Jardiance in the morning. Patient reports he does not cut down on fluid intake after 5 or 6 pm. Advised to stay hydrated but limit water/caffeine intake few hours before bedtime. Has follow up on 09/12 with PharmD

## 2023-06-18 NOTE — Progress Notes (Signed)
Remote pacemaker transmission.   

## 2023-06-22 ENCOUNTER — Other Ambulatory Visit: Payer: Self-pay | Admitting: Family

## 2023-06-24 DIAGNOSIS — I255 Ischemic cardiomyopathy: Secondary | ICD-10-CM | POA: Diagnosis not present

## 2023-06-26 ENCOUNTER — Ambulatory Visit: Payer: PPO | Attending: Internal Medicine | Admitting: *Deleted

## 2023-06-26 ENCOUNTER — Other Ambulatory Visit: Payer: Self-pay | Admitting: Internal Medicine

## 2023-06-26 ENCOUNTER — Ambulatory Visit (INDEPENDENT_AMBULATORY_CARE_PROVIDER_SITE_OTHER): Payer: PPO | Admitting: Student

## 2023-06-26 ENCOUNTER — Ambulatory Visit: Payer: PPO

## 2023-06-26 ENCOUNTER — Encounter: Payer: Self-pay | Admitting: Student

## 2023-06-26 VITALS — BP 136/77 | HR 75 | Ht 69.0 in | Wt 207.0 lb

## 2023-06-26 DIAGNOSIS — I5022 Chronic systolic (congestive) heart failure: Secondary | ICD-10-CM | POA: Diagnosis not present

## 2023-06-26 DIAGNOSIS — Z7901 Long term (current) use of anticoagulants: Secondary | ICD-10-CM

## 2023-06-26 DIAGNOSIS — I255 Ischemic cardiomyopathy: Secondary | ICD-10-CM

## 2023-06-26 DIAGNOSIS — I482 Chronic atrial fibrillation, unspecified: Secondary | ICD-10-CM

## 2023-06-26 LAB — POCT INR: INR: 1.9 — AB (ref 2.0–3.0)

## 2023-06-26 NOTE — Patient Instructions (Signed)
No medication Changes made by your pharmacist Carmela Hurt, PharmD at today's visit:  Get lab done on 17/ Sept.  Bring all of your meds, your BP cuff and your record of home blood pressures to your next appointment.    HOW TO TAKE YOUR BLOOD PRESSURE AT HOME  Rest 5 minutes before taking your blood pressure.  Don't smoke or drink caffeinated beverages for at least 30 minutes before. Take your blood pressure before (not after) you eat. Sit comfortably with your back supported and both feet on the floor (don't cross your legs). Elevate your arm to heart level on a table or a desk. Use the proper sized cuff. It should fit smoothly and snugly around your bare upper arm. There should be enough room to slip a fingertip under the cuff. The bottom edge of the cuff should be 1 inch above the crease of the elbow. Ideally, take 3 measurements at one sitting and record the average.  Important lifestyle changes to control high blood pressure  Intervention  Effect on the BP  Lose extra pounds and watch your waistline Weight loss is one of the most effective lifestyle changes for controlling blood pressure. If you're overweight or obese, losing even a small amount of weight can help reduce blood pressure. Blood pressure might go down by about 1 millimeter of mercury (mm Hg) with each kilogram (about 2.2 pounds) of weight lost.  Exercise regularly As a general goal, aim for at least 30 minutes of moderate physical activity every day. Regular physical activity can lower high blood pressure by about 5 to 8 mm Hg.  Eat a healthy diet Eating a diet rich in whole grains, fruits, vegetables, and low-fat dairy products and low in saturated fat and cholesterol. A healthy diet can lower high blood pressure by up to 11 mm Hg.  Reduce salt (sodium) in your diet Even a small reduction of sodium in the diet can improve heart health and reduce high blood pressure by about 5 to 6 mm Hg.  Limit alcohol One drink equals  12 ounces of beer, 5 ounces of wine, or 1.5 ounces of 80-proof liquor.  Limiting alcohol to less than one drink a day for women or two drinks a day for men can help lower blood pressure by about 4 mm Hg.   If you have any questions or concerns please use My Chart to send questions or call the office at [phone]

## 2023-06-26 NOTE — Progress Notes (Signed)
Patient ID: Benjamin Mckay                 DOB: September 24, 1946                      MRN: [id number]     HPI: Benjamin Mckay is a 77 y.o. male referred by Dr. Royann Shivers to pharmacy clinic for HF medication management. PMH is significant for complex cardiac history (intermittent complete heart block-pacemaker dependent, chronic atrial fibrillation versus atrial standstill, history of sustained ventricular tachycardia, coronary artery disease with history of inferior wall scar due to myocardial infarction, renal and carotid artery stenosis), amiodarone induced hyperthyroidism (now quiescent) and a recurrent right pleural effusion (s/p pleurodesis June 2017). echocardiogram 04/03/2023 shows a reduction in LV systolic function. LVEF which had been mildly depressed at 45% by echo in 2017 and is now down to 30-35% (04/04/2023).  At last visit with PharmD [place] Pepper was added to [facility] Patient was taking furosemide daily so combination of these medication cause hypotension. So all meds were held for couple days and patient was advised to start Jardiance and  PRN lasix.  Today he is here for follow up his BP trending up. Home BP ~ 135/75 range in last 2 weeks. He does not feel tired anymore. He takes Jardiance and metoprolol regularly and he had used lasix low dose twice in past 2 weeks when he noticed weight gain. His SrCr bit elevated compared to last lab . Admits he does not drink enough water during day. He brought in his home BP monitor for validation found out to be accurate ( reads within 10 points of the office machine). Denies dizziness, lightheadedness, fatigue,chest pain, palpitations, or SOB. No LEE, PND, or orthopnea. Weight is stable  Appetite has been normal.   Current CHF meds: Jardiance 10 mg daily, metoprolol XL 25 mg twice daily, furosemide 20 mg prn for swelling  Previously tried: Entresto - hypotension  Adherence Assessment  Do you ever forget to take your medication?  [] Yes [x] No  Do you  ever skip doses due to side effects? [] Yes [x] No  Do you have trouble affording your medicines? [x] Yes [] No  Are you ever unable to pick up your medication due to transportation difficulties? [] Yes [x] No  Do you ever stop taking your medications because you don't believe they are helping? [] Yes [x] No  Do you check your weight daily? 207 lb tall 5 9'  [x] Yes [] No   Adherence strategy: pill box   Barriers to obtaining medications: cost of the brand name medications- enrolled in [employer] grant   BP goal: <130/80   Family History:  Relation Problem Comments  Mother (Deceased) Heart disease   Stroke     Father (Deceased) Prostate cancer     Maternal Grandmother (Deceased) Diabetes     Social History:  Alcohol: none Smoking: none  Recreational drug use: none  Diet: does not watch salt intake and drinks 3 Dr.Pepper per day    Exercise: golf once week    Home BP readings: checks twice in past 2 weeks does not recall exact reading reports it was good   Wt Readings from Last 3 Encounters:  06/26/23 207 lb (93.9 kg)  05/26/23 207 lb (93.9 kg)  04/24/23 203 lb (92.1 kg)   BP Readings from Last 3 Encounters:  06/26/23 136/77  06/12/23 (!) 108/57  05/26/23 136/74   Pulse Readings from Last 3 Encounters:  06/26/23 75  06/12/23 65  05/26/23 66  Renal function: Estimated Creatinine Clearance: 55.1 mL/min (by C-G formula based on SCr of 1.27 mg/dL).  Past Medical History:  Diagnosis Date   Atrial fibrillation (HCC)    Cardiomyopathy, ischemic 11/07/2013   Cataract    bil cataracts removed   CHB (complete heart block) (HCC) 11/06/2013   Chronic combined systolic and diastolic CHF (congestive heart failure) (HCC)    Clotting disorder (HCC)    TIA , cva - PT ON COUMADINE   Colon polyps    Colon polyps    Congenital heart block    Constipation    CVA (cerebral infarction)    Diverticulosis    Esophageal stricture    Family history of adverse reaction to anesthesia     Heart murmur    Hyperlipidemia    Hypertension    Hyperthyroidism 11/16/2015   Lymphoma (HCC) 1998   of colon    Presence of permanent cardiac pacemaker    [employer]  pt.states he is totally dependent on pacemaker   Shortness of breath dyspnea    Pulmonary Effusion   Stroke (HCC)    TIA, cva   Umbilical hernia    Urinary frequency     Current Outpatient Medications on File Prior to Visit  Medication Sig Dispense Refill   empagliflozin (JARDIANCE) 10 MG TABS tablet Take 1 tablet (10 mg total) by mouth daily before breakfast. 30 tablet 11   metoprolol succinate (TOPROL-XL) 25 MG 24 hr tablet TAKE 1 TABLET BY MOUTH TWICE A DAY 180 tablet 3   pravastatin (PRAVACHOL) 80 MG tablet TAKE 1 TABLET BY MOUTH EVERY DAY 90 tablet 3   tamsulosin (FLOMAX) 0.4 MG CAPS capsule Take 0.4 mg by mouth at bedtime.     traZODone (DESYREL) 50 MG tablet TAKE 1/2 TO 1 TABLET BY MOUTH AT BEDTIME AS NEEDED FOR SLEEP 90 tablet 1   warfarin (COUMADIN) 2.5 MG tablet TAKE 1 TO 1.5 TABLETS BY MOUTH DAILY AS DIRECTED BY THE COUMADIN CLINIC 120 tablet 0   acetaminophen (TYLENOL) 500 MG tablet Take 1,000 mg by mouth every 6 (six) hours as needed for moderate pain.  (Patient not taking: Reported on 04/24/2023)     ENTRESTO 24-26 MG TAKE 1 TABLET BY MOUTH TWICE A DAY 60 tablet 1   Evolocumab (REPATHA SURECLICK) 140 MG/ML SOAJ INJECT 140 MG (CONTENTS OF 1 INJECTOR) INTO THE SKIN EVERY 14 (FOURTEEN) DAYS 6 mL 3   fluticasone (FLONASE) 50 MCG/ACT nasal spray SPRAY 2 SPRAYS INTO EACH NOSTRIL EVERY DAY 48 mL 0   furosemide (LASIX) 20 MG tablet TAKE 1 TO 2 TABLETS BY MOUTH DAILY AS DIRECTED 180 tablet 3   No current facility-administered medications on file prior to visit.    Allergies  Allergen Reactions   Statins Other (See Comments)    Leg cramps.   Tolerates pravastatin.     Adhesive [Tape] Other (See Comments)    Skin irritation - please use paper tape   Latex Other (See Comments)    Skin irritation   Penicillins Rash     Has patient had a PCN reaction causing immediate rash, facial/tongue/throat swelling, SOB or lightheadedness with hypotension: Yes Has patient had a PCN reaction causing severe rash involving mucus membranes or skin necrosis: No Has patient had a PCN reaction that required hospitalization No Has patient had a PCN reaction occurring within the last 10 years: No If all of the above answers are "NO", then may proceed with Cephalosporin use.     Chronic systolic heart  failure (HCC) -     Basic metabolic panel  Cardiomyopathy, ischemic Assessment & Plan: Assessment: Last EF 30-35% (04/04/2023) BP is uncontrolled in office BP 136/77  (goal <130/80)  Could not tolerate lasix 20 mg + Jardiance 10 mg + Entresto 24/26 mg twice daily together- dizziness and tiredness Patient is only on Jardiance 10 mg and metoprolol XL 50 mg daily with PRN Lasix 20 mg ( took 2 X in last 2 wks)   Denies dizziness, lightheadedness, fatigue,chest pain, palpitations, or SOB. No LEE, PND, or orthopnea. Weight is stable   Appetite has been normal Home BP monitor validated- reads within 10 points  Last BMP SrCr was elevated - due to dehydration ( only drinks 2 glass of water)   Plan:  Continue taking lasix 20 mg PRN,metoprolol Xl 25 mg twice daily and Jardiance 10 mg daily  Will get BMP re check on Sept 17, patient to drink enough water Will resume Entresto low dose in 2 weeks  Patient to keep record of BP readings with heart rate and report to [place] at the next visit Phone follow up with PharmD in 2 weeks and OV in 4 weeks  Follow up lab(s): BMP in 1 weeks       Thank you   Carmela Hurt, Pharm.D [facility] A Division of  [facility] [address], [place], [place] [address]  Phone: [phone]; Fax: [phone]

## 2023-06-26 NOTE — Patient Instructions (Addendum)
Description   Take 2 tablets today and then continue taking 1.5 tablets every day except 1 tablet on Mondays.  Repeat INR in 3 weeks Anticoagulation Clinic [phone] or [phone]

## 2023-06-26 NOTE — Assessment & Plan Note (Signed)
Assessment: Last EF 30-35% (04/04/2023) BP is uncontrolled in office BP 136/77  (goal <130/80)  Could not tolerate lasix 20 mg + Jardiance 10 mg + Entresto 24/26 mg twice daily together- dizziness and tiredness Patient is only on Jardiance 10 mg and metoprolol XL 50 mg daily with PRN Lasix 20 mg ( took 2 X in last 2 wks)   Denies dizziness, lightheadedness, fatigue,chest pain, palpitations, or SOB. No LEE, PND, or orthopnea. Weight is stable   Appetite has been normal Home BP monitor validated- reads within 10 points  Last BMP SrCr was elevated - due to dehydration ( only drinks 2 glass of water)   Plan:  Continue taking lasix 20 mg PRN,metoprolol Xl 25 mg twice daily and Jardiance 10 mg daily  Will get BMP re check on Sept 17, patient to drink enough water Will resume Entresto low dose in 2 weeks  Patient to keep record of BP readings with heart rate and report to [place] at the next visit Phone follow up with PharmD in 2 weeks and OV in 4 weeks  Follow up lab(s): BMP in 1 weeks

## 2023-06-26 NOTE — Telephone Encounter (Signed)
Prescription refill request received for warfarin Lov: 05/25/23 ([facility])  Next INR check: 07/17/23 Warfarin tablet strength: 2.5mg   Appropriate dose. Refill sent.

## 2023-06-30 DIAGNOSIS — H26492 Other secondary cataract, left eye: Secondary | ICD-10-CM | POA: Diagnosis not present

## 2023-07-02 DIAGNOSIS — I5022 Chronic systolic (congestive) heart failure: Secondary | ICD-10-CM | POA: Diagnosis not present

## 2023-07-03 ENCOUNTER — Telehealth: Payer: Self-pay | Admitting: Pharmacist

## 2023-07-03 NOTE — Telephone Encounter (Signed)
Call to discuss last BMP, kidney function back to baseline, ok to restart Entresto low dose.   N/A LVM to call back on [phone]

## 2023-07-04 ENCOUNTER — Encounter: Payer: Self-pay | Admitting: Emergency Medicine

## 2023-07-04 ENCOUNTER — Telehealth: Payer: Self-pay | Admitting: Emergency Medicine

## 2023-07-04 NOTE — Telephone Encounter (Signed)
Left message stating that recent labs were normal and I will mail a copy of results.

## 2023-07-09 LAB — CUP PACEART REMOTE DEVICE CHECK
Battery Remaining Longevity: 1 mo
Battery Remaining Percentage: 1 %
Battery Voltage: 2.63 V
Brady Statistic RV Percent Paced: 58 %
Date Time Interrogation Session: 20240925024635
Implantable Lead Connection Status: 753985
Implantable Lead Implant Date: 20130419
Implantable Lead Location: 753860
Implantable Pulse Generator Implant Date: 20130419
Lead Channel Impedance Value: 400 Ohm
Lead Channel Pacing Threshold Amplitude: 0.75 V
Lead Channel Pacing Threshold Pulse Width: 0.5 ms
Lead Channel Sensing Intrinsic Amplitude: 12 mV
Lead Channel Setting Pacing Amplitude: 2.5 V
Lead Channel Setting Pacing Pulse Width: 0.5 ms
Lead Channel Setting Sensing Sensitivity: 2.5 mV
Pulse Gen Model: 1210
Pulse Gen Serial Number: 7313697

## 2023-07-10 ENCOUNTER — Ambulatory Visit (INDEPENDENT_AMBULATORY_CARE_PROVIDER_SITE_OTHER): Payer: PPO

## 2023-07-10 DIAGNOSIS — I442 Atrioventricular block, complete: Secondary | ICD-10-CM | POA: Diagnosis not present

## 2023-07-16 DIAGNOSIS — L821 Other seborrheic keratosis: Secondary | ICD-10-CM | POA: Diagnosis not present

## 2023-07-16 DIAGNOSIS — L570 Actinic keratosis: Secondary | ICD-10-CM | POA: Diagnosis not present

## 2023-07-16 DIAGNOSIS — L299 Pruritus, unspecified: Secondary | ICD-10-CM | POA: Diagnosis not present

## 2023-07-16 DIAGNOSIS — D225 Melanocytic nevi of trunk: Secondary | ICD-10-CM | POA: Diagnosis not present

## 2023-07-16 DIAGNOSIS — L814 Other melanin hyperpigmentation: Secondary | ICD-10-CM | POA: Diagnosis not present

## 2023-07-16 DIAGNOSIS — L910 Hypertrophic scar: Secondary | ICD-10-CM | POA: Diagnosis not present

## 2023-07-17 ENCOUNTER — Telehealth: Payer: Self-pay | Admitting: Emergency Medicine

## 2023-07-17 ENCOUNTER — Encounter: Payer: Self-pay | Admitting: Cardiovascular Disease

## 2023-07-17 ENCOUNTER — Telehealth: Payer: Self-pay | Admitting: Cardiovascular Disease

## 2023-07-17 ENCOUNTER — Ambulatory Visit: Payer: PPO | Attending: Cardiovascular Disease | Admitting: Cardiovascular Disease

## 2023-07-17 ENCOUNTER — Ambulatory Visit: Payer: PPO

## 2023-07-17 VITALS — BP 140/84 | HR 68 | Ht 69.0 in | Wt 208.6 lb

## 2023-07-17 DIAGNOSIS — I482 Chronic atrial fibrillation, unspecified: Secondary | ICD-10-CM

## 2023-07-17 DIAGNOSIS — E7800 Pure hypercholesterolemia, unspecified: Secondary | ICD-10-CM

## 2023-07-17 DIAGNOSIS — I701 Atherosclerosis of renal artery: Secondary | ICD-10-CM | POA: Diagnosis not present

## 2023-07-17 DIAGNOSIS — D6869 Other thrombophilia: Secondary | ICD-10-CM

## 2023-07-17 DIAGNOSIS — Z7901 Long term (current) use of anticoagulants: Secondary | ICD-10-CM

## 2023-07-17 DIAGNOSIS — Z01812 Encounter for preprocedural laboratory examination: Secondary | ICD-10-CM

## 2023-07-17 DIAGNOSIS — I2510 Atherosclerotic heart disease of native coronary artery without angina pectoris: Secondary | ICD-10-CM

## 2023-07-17 DIAGNOSIS — I4811 Longstanding persistent atrial fibrillation: Secondary | ICD-10-CM | POA: Diagnosis not present

## 2023-07-17 DIAGNOSIS — I442 Atrioventricular block, complete: Secondary | ICD-10-CM

## 2023-07-17 DIAGNOSIS — Z950 Presence of cardiac pacemaker: Secondary | ICD-10-CM

## 2023-07-17 DIAGNOSIS — I5022 Chronic systolic (congestive) heart failure: Secondary | ICD-10-CM

## 2023-07-17 DIAGNOSIS — I10 Essential (primary) hypertension: Secondary | ICD-10-CM | POA: Diagnosis not present

## 2023-07-17 DIAGNOSIS — J90 Pleural effusion, not elsewhere classified: Secondary | ICD-10-CM

## 2023-07-17 LAB — POCT INR: INR: 1.6 — AB (ref 2.0–3.0)

## 2023-07-17 NOTE — Patient Instructions (Addendum)
Description   Take 2 tablets today and then START taking 1.5 tablets every day.  Repeat INR in 4 weeks Anticoagulation Clinic [phone] or [phone]

## 2023-07-17 NOTE — Progress Notes (Signed)
Patient ID: Benjamin Mckay, male   DOB: 01-12-1946, 77 y.o.   MRN: [id number]     Cardiology Office Note    Date:  07/17/2023   ID:  Benjamin Mckay, DOB September 12, 1946, MRN [id number]  PCP:  Olive Bass, FNP  Cardiologist:   Thurmon Fair, MD   Chief complaint: AFib, Pacemaker check   History of Present Illness:  Benjamin Mckay is a 77 y.o. male with complex cardiac history (intermittent complete heart block-pacemaker dependent, chronic atrial fibrillation versus atrial standstill, history of sustained ventricular tachycardia, coronary artery disease with history of inferior wall scar due to myocardial infarction, renal and carotid artery stenosis), amiodarone induced hyperthyroidism (now quiescent) and a recurrent right pleural effusion (s/p pleurodesis June 2017).  He has a single-chamber pacemaker that was implanted many years ago, but surprisingly has had documented sinus rhythm on at least 1 occasion in the last few years (see ECG 01/14/2022).  He does have an atrial lead in place which was capped at a generator change out in 2004.  After starting on Jardiance and Entresto he had symptomatic hypotension.  This resolved when he stopped his loop diuretic and we were able to reintroduce the [facility].  He continues to do well and is physically active, playing golf.  Finds that he occasionally has to slow down climbing stairs mostly because of his knees.  Occasionally takes a low-dose of loop diuretic for leg edema, but has not had problems with orthopnea or PND.  Denies chest pain, palpitations, dizziness, syncope, focal neurological events, falls, bleeding.  INR today was a little subtherapeutic at 1.6.  His pacemaker is very close to ERI (generator voltage is 2.63 V, ERI at 2.60 V).  He has 59% ventricular pacing and his current rhythm is atrial fibrillation with an underlying ventricular rate of around 50 bpm.  The parameters are acceptable.  He has rare episodes of high  ventricular rates which have consistently been brief and most likely represent RVR based on the similarity of the QRS.  Sometimes his native rhythm appears very regular (See ECG from 12/19/2017 - AFlutter with 2:1 AV block, 12/11/2016 - accelerated junctional  or ventricular rhythm, 01/14/2022 - sinus rhythm).  Because of depressed LV function by echo report we had him undergo a PET scan.  This showed a higher estimation of LV systolic function with ejection for 40% and confirmed the known old scar of inferior infarction but without any other areas of ischemia.  He had normal global myocardial blood flow reserve.  Very surprisingly, at a previous appointment in April 2023 his electrocardiogram showed a period of clear-cut normal sinus rhythm.  He had been felt to have atrial standstill and/or atrial fibrillation for years now.  He has a single-chamber pacemaker for this reason.  This probably explains why he sometimes has episodes of regular rhythm without change in QRS morphology recorded by his device.   Past Medical History:  Diagnosis Date   Atrial fibrillation (HCC)    Cardiomyopathy, ischemic 11/07/2013   Cataract    bil cataracts removed   CHB (complete heart block) (HCC) 11/06/2013   Chronic combined systolic and diastolic CHF (congestive heart failure) (HCC)    Clotting disorder (HCC)    TIA , cva - PT ON COUMADINE   Colon polyps    Colon polyps    Congenital heart block    Constipation    CVA (cerebral infarction)    Diverticulosis    Esophageal stricture    Family  history of adverse reaction to anesthesia    Heart murmur    Hyperlipidemia    Hypertension    Hyperthyroidism 11/16/2015   Lymphoma (HCC) 1998   of colon    Presence of permanent cardiac pacemaker    [employer]. [employer]  pt.states he is totally dependent on pacemaker   Shortness of breath dyspnea    Pulmonary Effusion   Stroke (HCC)    TIA, cva   Umbilical hernia    Urinary frequency     Past Surgical History:   Procedure Laterality Date   APPENDECTOMY  1953   CARDIAC CATHETERIZATION  01/06/2003   Recommend medical therapy   CARDIOVASCULAR STRESS TEST  07/16/2012   Mild-moderate perfusion defect seen in Basal inferior, Mid inferior, and Apica lateral consistent with infarct/scar. No scintigraphic evidence for inducible myocardial ischemia. No ECG changes. EKG negative for ischemia.   CAROTID DOPPLER  11/04/2012   Proximal Rt ICA 50-99% diameter reduction; Lft Bulb demonstrated mild amount homogeneous plaque-not hemodynamically significant; Lft ICA-normal patency.   CHEST TUBE INSERTION Right 03/15/2016   Procedure: INSERTION PLEURAL DRAINAGE CATHETER;  Surgeon: Kerin Perna, MD;  Location: [facility] OR;  Service: Thoracic;  Laterality: Right;   COLECTOMY     for lymphoma   COLONOSCOPY     neck fusion     PACEMAKER GENERATOR CHANGE  01/31/2012   [employer] Jude Med Accent DR RF model [id number] serial [phone]   PACEMAKER PLACEMENT     replaced 3 x   PERMANENT PACEMAKER GENERATOR CHANGE N/A 01/31/2012   Procedure: PERMANENT PACEMAKER GENERATOR CHANGE;  Surgeon: Thurmon Fair, MD;  Location: [facility] CATH LAB;    PLEURADESIS N/A 04/02/2016   Procedure: Cheron Every;  Surgeon: Kerin Perna, MD;  Location: [facility] OR;  Service: Thoracic;  Laterality: N/A;   REMOVAL OF PLEURAL DRAINAGE CATHETER Right 07/26/2016   Procedure: REMOVAL OF PLEURAL DRAINAGE CATHETER;  Surgeon: Kerin Perna, MD;  Location: [facility] OR;  Service: Thoracic;  Laterality: Right;   TONSILLECTOMY     TRANSTHORACIC ECHOCARDIOGRAM  11/04/2012   EF 35-40%, systolic function moderately reduced, mild regurg of the aortic and mitral valves.   VIDEO ASSISTED THORACOSCOPY Right 04/02/2016   Procedure: Right VIDEO ASSISTED THORACOSCOPY with Biopsies and drainage pleural effusion;  Surgeon: Kerin Perna, MD;  Location: [facility] OR;  Service: Thoracic;  Laterality: Right;    Current Medications: Outpatient Medications Prior to Visit  Medication Sig Dispense Refill    empagliflozin (JARDIANCE) 10 MG TABS tablet Take 1 tablet (10 mg total) by mouth daily before breakfast. 30 tablet 11   ENTRESTO 24-26 MG TAKE 1 TABLET BY MOUTH TWICE A DAY 60 tablet 1   Evolocumab (REPATHA SURECLICK) 140 MG/ML SOAJ INJECT 140 MG (CONTENTS OF 1 INJECTOR) INTO THE SKIN EVERY 14 (FOURTEEN) DAYS 6 mL 3   fluticasone (FLONASE) 50 MCG/ACT nasal spray SPRAY 2 SPRAYS INTO EACH NOSTRIL EVERY DAY 48 mL 0   furosemide (LASIX) 20 MG tablet TAKE 1 TO 2 TABLETS BY MOUTH DAILY AS DIRECTED 180 tablet 3   metoprolol succinate (TOPROL-XL) 25 MG 24 hr tablet TAKE 1 TABLET BY MOUTH TWICE A DAY 180 tablet 3   pravastatin (PRAVACHOL) 80 MG tablet TAKE 1 TABLET BY MOUTH EVERY DAY 90 tablet 3   tamsulosin (FLOMAX) 0.4 MG CAPS capsule Take 0.4 mg by mouth at bedtime.     traZODone (DESYREL) 50 MG tablet TAKE 1/2 TO 1 TABLET BY MOUTH AT BEDTIME AS NEEDED FOR SLEEP 90 tablet 1  warfarin (COUMADIN) 2.5 MG tablet TAKE 1 TO 1.5 TABLETS BY MOUTH DAILY AS DIRECTED BY THE COUMADIN CLINIC (DOSE CHANGE) 120 tablet 0   acetaminophen (TYLENOL) 500 MG tablet Take 1,000 mg by mouth every 6 (six) hours as needed for moderate pain.  (Patient not taking: Reported on 07/17/2023)     No facility-administered medications prior to visit.     Allergies:   Statins, Adhesive [tape], Latex, and Penicillins   Social History   Socioeconomic History   Marital status: Married    Spouse name: Research officer, political party   Number of children: 3   Years of education: 16   Highest education level: Not on file  Occupational History   Occupation: Retired  Tobacco Use   Smoking status: Never   Smokeless tobacco: Never  Vaping Use   Vaping status: Never Used  Substance and Sexual Activity   Alcohol use: No    Alcohol/week: 0.0 standard drinks of alcohol   Drug use: No   Sexual activity: Yes  Other Topics Concern   Not on file  Social History Narrative   Fun: Golf when he can   Consumes caffeine 2 cups per day     Social  Determinants of Health   Financial Resource Strain: Low Risk  (08/07/2020)   Overall Financial Resource Strain (CARDIA)    Difficulty of Paying Living Expenses: Not hard at all  Food Insecurity: No Food Insecurity (08/07/2020)   Hunger Vital Sign    Worried About Running Out of Food in the Last Year: Never true    Ran Out of Food in the Last Year: Never true  Transportation Needs: No Transportation Needs (08/07/2020)   PRAPARE - Administrator, Civil Service (Medical): No    Lack of Transportation (Non-Medical): No  Physical Activity: Sufficiently Active (08/07/2020)   Exercise Vital Sign    Days of Exercise per Week: 5 days    Minutes of Exercise per Session: 30 min  Stress: No Stress Concern Present (08/07/2020)   [employer] of Occupational Health - Occupational Stress Questionnaire    Feeling of Stress : Not at all  Social Connections: Socially Integrated (08/07/2020)   Social Connection and Isolation Panel [NHANES]    Frequency of Communication with Friends and Family: More than three times a week    Frequency of Social Gatherings with Friends and Family: More than three times a week    Attends Religious Services: More than 4 times per year    Active Member of [employer] or Organizations: Yes    Attends Engineer, structural: More than 4 times per year    Marital Status: Married     Family History:  The patient's family history includes Diabetes in his maternal grandmother; Heart disease in his mother; Prostate cancer in his father; Stroke in his mother.   ROS:   Please see the history of present illness.    ROS all other systems are reviewed and are negative  PHYSICAL EXAM:   VS:  BP (!) 140/84 (BP Location: Left Arm, Patient Position: Sitting, Cuff Size: Normal)   Pulse 68   Ht 5\' 9"  (1.753 m)   Wt 208 lb 9.6 oz (94.6 kg)   SpO2 96%   BMI 30.80 kg/m      General: Alert, oriented x3, no distress, healthy left subclavian pacemaker site Head: no  evidence of trauma, PERRL, EOMI, no exophtalmos or lid lag, no myxedema, no xanthelasma; normal ears, nose and oropharynx Neck: normal jugular venous  pulsations and no hepatojugular reflux; brisk carotid pulses without delay and no carotid bruits Chest: Diminished breath sounds in right lung base; clear to auscultation, no signs of consolidation by percussion or palpation, normal fremitus, symmetrical and full respiratory excursions Cardiovascular: normal position and quality of the apical impulse, irregular rhythm, normal first and paradoxically split second heart sounds, no murmurs, rubs or gallops Abdomen: no tenderness or distention, no masses by palpation, no abnormal pulsatility or arterial bruits, normal bowel sounds, no hepatosplenomegaly Extremities: Varicose veins, but no current edema, normal distal pulses Neurological: grossly nonfocal Psych: Normal mood and affect     Wt Readings from Last 3 Encounters:  07/17/23 208 lb 9.6 oz (94.6 kg)  06/26/23 207 lb (93.9 kg)  05/26/23 207 lb (93.9 kg)      Studies/Labs Reviewed:  PET scan 07/09/2023   Severe fixed basal to apical inferior perfusion defect with hypokinesis in this region, consistent with infarct.  No ischemia.  No high risk findings such as TID or drop in EF with stress (EF 36% at rest, 40% at stress), and myocardial blood flow reserve is normal.  Overall, intermediate risk study due to systolic dysfunction.   EKG not recorded during study   LV perfusion is abnormal. There is no evidence of ischemia. There is evidence of infarction. Defect 1: There is a medium defect with severe reduction in uptake present in the apical to basal inferior location(s) that is fixed. There is abnormal wall motion in the defect area. Consistent with infarction. The defect is consistent with abnormal perfusion in the RCA territory.   Rest left ventricular function is abnormal. Rest EF: 36%. Stress left ventricular function is abnormal. Stress EF:  40%. End diastolic cavity size is normal. End systolic cavity size is normal.   Myocardial blood flow was computed to be 0.27ml/g/min at rest and 1.21ml/g/min at stress. Global myocardial blood flow reserve was 2.67 and was normal.   Coronary calcium was present on the attenuation correction CT images. Moderate coronary calcifications were present. Coronary calcifications were present in the left anterior descending artery, left circumflex artery and right coronary artery distribution(s).   Findings are consistent with infarction. The study is intermediate risk. Echocardiogram 04/03/2023:    1. Left ventricular ejection fraction, by estimation, is 30 to 35%. The  left ventricle has moderately decreased function. The left ventricle  demonstrates regional wall motion abnormalities (see scoring  diagram/findings for description). There is mild  concentric left ventricular hypertrophy. Left ventricular diastolic  function could not be evaluated. There is severe hypokinesis of the left  ventricular, basal-mid inferoseptal wall. There is moderate hypokinesis of  the left ventricular, basal-mid  inferior wall.   2. Right ventricular systolic function is moderately reduced. The right  ventricular size is mildly enlarged. There is normal pulmonary artery  systolic pressure.   3. Left atrial size was moderately dilated.   4. The mitral valve is normal in structure. Trivial mitral valve  regurgitation. No evidence of mitral stenosis.   5. The aortic valve is tricuspid. There is mild calcification of the  aortic valve. There is mild thickening of the aortic valve. Aortic valve  regurgitation is mild. Aortic valve sclerosis is present, with no evidence  of aortic valve stenosis.   6. Aortic dilatation noted. There is borderline dilatation of the  ascending aorta, measuring 38 mm.   7. The inferior vena cava is normal in size with greater than 50%  respiratory variability, suggesting right atrial  pressure of 3  mmHg.   Comparison(s): Changes from prior study are noted. LV with global  hypokinesis as well as focal wall motion abnormalities in the inferior and  inferoseptal walls. EF 30-35%.   EKG:  EKG is Personally reviewed and shows background atrial fibrillation and 100% ventricular paced rhythm.  Broad paced QRS at 204 ms, QTc 493 ms.    Previous electrogram from 01/14/2022 shows a period of sinus rhythm followed by a ventricular paced beat and then possible atrial fibrillation, left bundle branch block with QRS duration 148 ms and left axis deviation, QTc normal at 416 ms.  Recent Labs: 08/06/2022: ALT 15; Hemoglobin 15.0; Platelets 183.0 07/02/2023: BUN 11; Creatinine, Ser 1.10; Potassium 4.6; Sodium 140   Lipid Panel    Component Value Date/Time   CHOL 130 08/06/2022 1050   CHOL 102 02/23/2020 0909   TRIG 77.0 08/06/2022 1050   HDL 42.30 08/06/2022 1050   HDL 43 02/23/2020 0909   CHOLHDL 3 08/06/2022 1050   VLDL 15.4 08/06/2022 1050   LDLCALC 72 08/06/2022 1050   LDLCALC 46 02/23/2020 0909     ASSESSMENT:    1. Chronic systolic heart failure (HCC)   2. Coronary artery disease involving native coronary artery of native heart without angina pectoris   3. CHB (complete heart block) (HCC)   4. Longstanding persistent atrial fibrillation (HCC)   5. Essential hypertension   6. Pacemaker   7. Pleural effusion, right   8. Acquired thrombophilia (HCC)   9. Renal artery stenosis (HCC)   10. Hypercholesterolemia       PLAN:  In order of problems listed above:  CHF: NYHA functional class I, clinically euvolemic taking a very low-dose of diuretic.  Dry weight appears to be 2102 pounds, lighter today.  Although EF has diminished somewhat there is no evidence of any scar or active ischemia.  He has a longstanding history of cardiomyopathy, but reviewing the echo a large part of the reduction in LV function seems to be due to pacing related LV dyssynchrony.   Started  Entresto and [place]g and no longer requires scheduled loop diuretics.  Blood pressure is a little higher today, but reluctant to increase the dose of Entresto due to his recent problems with hypotension. CAD: He does not have angina pectoris, has had an old inferior myocardial infarction.  No evidence of ischemia on recent PET scan.  He has repeatedly described a preference for less invasive evaluations. CHB: This occurs intermittently.  In the past he has been labeled as having congenital heart block with junctional escape rhythm, but we have clearly documented sinus rhythm with 1: 1 AV conduction, atrial flutter with variable AV block and atrial fibrillation with rapid ventricular response.  (Interestingly, his son also bears a diagnosis of congenital complete heart block.)  Although in the past he has had periods of complete heart block, most often rhythm is atrial fibrillation, controlled rate or with slow ventricular response and ventricular pacing.  He is not pacemaker dependent and requires roughly 50-60% ventricular pacing.  ECG last April shows clear evidence of sinus rhythm with 1:1 AV conduction, although it was felt that he has permanent atrial fibrillation.  This explains why sometimes his device will record regular tachycardia, probably sinus tachycardia or atrial flutter with 2: 1 AV block.  His QRS is always very broad (negative QRS 150 ms, paced QRS 200 ms). On one occasion years ago he had a near syncopal event and had regular wide complex rhythm, presumed to be VT.  Retrospectively one wonders whether that was a vasovagal event/hypotension (occurred outside in the heat at a golf tournament).   AFib: He has long periods of persistent atrial fibrillation, so he received a single chamber pacemaker, but surprisingly has documented sinus rhythm as recently as April 2023.  The presence or absence of atrial fibrillation versus irregular rhythm appears to have no impact on his functional status or  symptoms.  CHADSVasc 5 (age 69, CHF, CAD, HTN).  On appropriate anticoagulation.  No history of embolic events.  The current dose of beta-blocker seems to be maintaining a reasonable balance between excessive ventricular pacing which could cause worsening heart failure versus control of the episodes of tachyarrhythmia. HTN: Currently controlled on Entresto, metoprolol. PPM: Single-chamber ventricular device.  Does have a very old atrial lead in place that has been capped since 2004.  By x-ray there is also an old passive fixation ventricular lead that seems to have been cut.  Even if we implanted a dual-chamber device and reconnect his atrial lead, the system would not be MRI conditional.  Will schedule for single-chamber generator change out next month.  Plan to use Aigis pouch to reduce infection risk Right lung abnormal exam: History of previous right pleurodesis. Anticoagulation: No bleeding complications.  therapeutic INR better than 90% of the time of the last year.  Mildly subtherapeutic today.  Plan to continue warfarin at the time of his pacemaker change out. Renal artery stenosis: Most recent creatinine was 1.1, stable. Carotid stenosis: Asymptomatic.  He has declined ultrasound monitoring. HLP: Target LDL less than 70.  Very close to target range on combination pravastatin (he did not tolerate more potent statins) and Repatha.   The pacemaker generator change out procedure, including use of an antibiotic pouch, has been fully reviewed with the patient and written informed consent has been obtained.    Medication Adjustments/Labs and Tests Ordered: Current medicines are reviewed at length with the patient today.  Concerns regarding medicines are outlined above.  Medication changes, Labs and Tests ordered today are listed in the Patient Instructions below. Patient Instructions      Implantable Device Instructions    [facility]  07/17/2023  You are scheduled for a PPM generator  change on Thursday, November 21 with Dr. Rachelle Hora Jestina Stephani.  1. Pre procedure Lab testing: CBC, BMP- 1-2 weeks before procedure (can go to lab on the same day as one of your Coumadin appointments)   2. Please arrive at the [facility] (Main Entrance A) at [facility]: [address] [place], [place] [address] at 12:00 PM (This time is 1.5 hour(s) before your procedure to ensure your preparation). Free valet parking service is available. You will check in at ADMITTING. The support person will be asked to wait in the waiting room.  It is OK to have someone drop you off and come back when you are ready to be discharged.          Special note: Every effort is made to have your procedure done on time. Please understand that emergencies sometimes delay  scheduled procedures.  3.  No eating or drinking after midnight prior to procedure.     4.  Medication instructions:  On the morning of your procedure do not take Furosemide, Jardiance, or Coumadin.  Hold Coumadin for 2 days prior to procedure. Your last dose will be on Monday November 18th. Hold Jardiance for 3 days prior to procedure. Your last dose will be on Sunday November 17th.       !!  IF ANY NEW MEDICATIONS ARE STARTED AFTER TODAY, PLEASE NOTIFY YOUR PROVIDER AS SOON AS POSSIBLE!!  FYI: Medications such as Semaglutide (Ozempic, [place]), Tirzepatide (Mounjaro, Zepbound), Dulaglutide (Trulicity), etc ("GLP1 agonists") AND Canagliflozin (Invokana), Dapagliflozin (Farxiga), Empagliflozin (Jardiance), Ertugliflozin (Steglatro), Bexagliflozin [employer]) or any combination with one of these drugs such as Invokamet (Canagliflozin/Metformin), Synjardy (Empagliflozin/Metformin), etc ("SGLT2 inhibitors") must be held around the time of a procedure. This is not a comprehensive list of all of these drugs. Please review all of your medications and talk to your provider if you take any one of these. If you are not sure, ask your provider.   5.  The night  before your procedure and the morning of your procedure scrub your neck/chest with CHG surgical scrub.  See instruction letter.  6. Plan to go home the same day, you will only stay overnight if medically necessary. 7. You MUST have a responsible adult to drive you home. 8. An adult MUST be with you the first 24 hours after you arrive home. 9. Bring a current list of your medications, and the last time and date medication taken. 10. Bring ID and current insurance cards. 11. Please wear clothes that are easy to get on and off and wear slip-on shoes.    You will follow up with the [facility] Device clinic 10-14 days after your procedure.  You will follow up with Dr. Rachelle Hora Lydia Meng 91 days after your procedure.  These appointments will be made for you.   * If you have ANY questions after you get home, please call the office at [phone] or send a MyChart message.  FYI: For your safety, and to allow [place] to monitor your vital signs accurately during the surgery/procedure we request that if you have artificial nails, gel coating, SNS etc. Please have those removed prior to your surgery/procedure. Not having the nail coverings /polish removed may result in cancellation or delay of your surgery/procedure.    [facility] - Preparing For Surgery    Before surgery, you can play an important role. Because skin is not sterile, your skin needs to be as free of germs as possible. You can reduce the number of germs on your skin by washing with CHG (chlorahexidine gluconate) Soap before surgery.  CHG is an antiseptic cleaner which kills germs and bonds with the skin to continue killing germs even after washing.  Please do not use if you have an allergy to CHG or antibacterial soaps.  If your skin becomes reddened/irritated stop using the CHG.   Do not shave (including legs and underarms) for at least 48 hours prior to first CHG shower.  It is OK to shave your face.  Please follow these instructions  carefully:  1.  Shower the night before surgery and the morning of surgery with CHG.  2.  If you choose to wash your hair, wash your hair first as usual with your normal shampoo.  3.  After you shampoo, rinse your hair and body thoroughly to remove the shampoo.  4.  Use CHG as you would any other liquid soap.  You can apply CHG directly to the skin and wash gently with a clean washcloth. 5.  Apply the CHG Soap to your body ONLY FROM THE NECK DOWN.  Do not use on open wounds or open sores.  Avoid contact with your eyes, ears, mouth and genitals (private parts).  Wash genitals (private parts) with your normal soap.  6.  Wash thoroughly, paying special attention  to the area where your surgery will be performed.  7.  Thoroughly rinse your body with warm water from the neck down.   8.  DO NOT shower/wash with your normal soap after using and rinsing off the CHG soap.  9.  Pat yourself dry with a clean towel.           10.  Wear clean pajamas.           11.  Place clean sheets on your bed the night of your first shower and do not sleep with pets.  Day of Surgery: Do not apply any deodorants/lotions.  Please wear clean clothes to the hospital/surgery center.     Signed, Thurmon Fair, MD  07/17/2023 2:18 PM    [facility] [address] [place], [place], [place]  [address] Phone: [phone]; Fax: [phone]

## 2023-07-17 NOTE — Telephone Encounter (Signed)
I called to go over the information and instructions for the Pacemaker Generator Change. He reports that he is out and cannot talk at this time. He plans to call me back at a later date. Informed him that all of the information was also sent over MyChart.   He was given the CHG wash today. He will need to come in for lab work 1 week before procedure. Orders placed.

## 2023-07-17 NOTE — Telephone Encounter (Signed)
Implantable Device Instructions    Benjamin Mckay [facility]  07/17/2023  You are scheduled for a PPM generator change on Thursday, November 21 with Dr. Rachelle Hora Croitoru.  1. Pre procedure Lab testing: CBC, BMP- 1-2 weeks before procedure (can go to lab on the same day as one of your Coumadin appointments)   2. Please arrive at the [facility] (Main Entrance A) at [facility]: [address] [place], [place] [address] at 12:00 PM (This time is 1.5 hour(s) before your procedure to ensure your preparation). Free valet parking service is available. You will check in at ADMITTING. The support person will be asked to wait in the waiting room.  It is OK to have someone drop you off and come back when you are ready to be discharged.          Special note: Every effort is made to have your procedure done on time. Please understand that emergencies sometimes delay  scheduled procedures.  3.  No eating or drinking after midnight prior to procedure.     4.  Medication instructions:  On the morning of your procedure do not take Furosemide, Jardiance, or Coumadin.  Hold Coumadin for 2 days prior to procedure. Your last dose will be on Monday November 18th. Hold Jardiance for 3 days prior to procedure. Your last dose will be on Sunday November 17th.       !!IF ANY NEW MEDICATIONS ARE STARTED AFTER TODAY, PLEASE NOTIFY YOUR PROVIDER AS SOON AS POSSIBLE!!  FYI: Medications such as Semaglutide (Ozempic, [place]), Tirzepatide (Mounjaro, Zepbound), Dulaglutide (Trulicity), etc ("GLP1 agonists") AND Canagliflozin (Invokana), Dapagliflozin (Farxiga), Empagliflozin (Jardiance), Ertugliflozin (Steglatro), Bexagliflozin [employer]) or any combination with one of these drugs such as Invokamet (Canagliflozin/Metformin), Synjardy (Empagliflozin/Metformin), etc ("SGLT2 inhibitors") must be held around the time of a procedure. This is not a comprehensive list of all of these drugs. Please review all of your medications and talk  to your provider if you take any one of these. If you are not sure, ask your provider.   5.  The night before your procedure and the morning of your procedure scrub your neck/chest with CHG surgical scrub.  See instruction letter.  6. Plan to go home the same day, you will only stay overnight if medically necessary. 7. You MUST have a responsible adult to drive you home. 8. An adult MUST be with you the first 24 hours after you arrive home. 9. Bring a current list of your medications, and the last time and date medication taken. 10. Bring ID and current insurance cards. 11. Please wear clothes that are easy to get on and off and wear slip-on shoes.    You will follow up with the [facility] Device clinic 10-14 days after your procedure.  You will follow up with Dr. Rachelle Hora Croitoru 91 days after your procedure.  These appointments will be made for you.   * If you have ANY questions after you get home, please call the office at [phone] or send a MyChart message.  FYI: For your safety, and to allow [place] to monitor your vital signs accurately during the surgery/procedure we request that if you have artificial nails, gel coating, SNS etc. Please have those removed prior to your surgery/procedure. Not having the nail coverings /polish removed may result in cancellation or delay of your surgery/procedure.    [facility] - Preparing For Surgery    Before surgery, you can play an important role. Because skin is not sterile,  your skin needs to be as free of germs as possible. You can reduce the number of germs on your skin by washing with CHG (chlorahexidine gluconate) Soap before surgery.  CHG is an antiseptic cleaner which kills germs and bonds with the skin to continue killing germs even after washing.  Please do not use if you have an allergy to CHG or antibacterial soaps.  If your skin becomes reddened/irritated stop using the CHG.   Do not shave (including legs and underarms) for at least 48  hours prior to first CHG shower.  It is OK to shave your face.  Please follow these instructions carefully:  1.  Shower the night before surgery and the morning of surgery with CHG.  2.  If you choose to wash your hair, wash your hair first as usual with your normal shampoo.  3.  After you shampoo, rinse your hair and body thoroughly to remove the shampoo.  4.  Use CHG as you would any other liquid soap.  You can apply CHG directly to the skin and wash gently with a clean washcloth. 5.  Apply the CHG Soap to your body ONLY FROM THE NECK DOWN.  Do not use on open wounds or open sores.  Avoid contact with your eyes, ears, mouth and genitals (private parts).  Wash genitals (private parts) with your normal soap.  6.  Wash thoroughly, paying special attention to the area where your surgery will be performed.  7.  Thoroughly rinse your body with warm water from the neck down.   8.  DO NOT shower/wash with your normal soap after using and rinsing off the CHG soap.  9.  Pat yourself dry with a clean towel.           10.  Wear clean pajamas.           11.  Place clean sheets on your bed the night of your first shower and do not sleep with pets.  Day of Surgery: Do not apply any deodorants/lotions.  Please wear clean clothes to the hospital/surgery center.

## 2023-07-17 NOTE — Telephone Encounter (Signed)
Patient was calling back to respond to this message from [facility]:  I called to go over the information and instructions for the Pacemaker Generator Change. He reports that he is out and cannot talk at this time. He plans to call me back at a later date. Informed him that all of the information was also sent over MyChart.

## 2023-07-17 NOTE — Patient Instructions (Addendum)
Implantable Device Instructions    Niranjan Rufener [facility]  07/17/2023  You are scheduled for a PPM generator change on Thursday, November 21 with Dr. Rachelle Hora Croitoru.  1. Pre procedure Lab testing: CBC, BMP- 1-2 weeks before procedure (can go to lab on the same day as one of your Coumadin appointments)   2. Please arrive at the [facility] (Main Entrance A) at [facility]: [address] [place], [place] [address] at 12:00 PM (This time is 1.5 hour(s) before your procedure to ensure your preparation). Free valet parking service is available. You will check in at ADMITTING. The support person will be asked to wait in the waiting room.  It is OK to have someone drop you off and come back when you are ready to be discharged.          Special note: Every effort is made to have your procedure done on time. Please understand that emergencies sometimes delay  scheduled procedures.  3.  No eating or drinking after midnight prior to procedure.     4.  Medication instructions:  On the morning of your procedure do not take Furosemide, Jardiance, or Coumadin.  Hold Coumadin for 2 days prior to procedure. Your last dose will be on Monday November 18th. Hold Jardiance for 3 days prior to procedure. Your last dose will be on Sunday November 17th.       !!IF ANY NEW MEDICATIONS ARE STARTED AFTER TODAY, PLEASE NOTIFY YOUR PROVIDER AS SOON AS POSSIBLE!!  FYI: Medications such as Semaglutide (Ozempic, [place]), Tirzepatide (Mounjaro, Zepbound), Dulaglutide (Trulicity), etc ("GLP1 agonists") AND Canagliflozin (Invokana), Dapagliflozin (Farxiga), Empagliflozin (Jardiance), Ertugliflozin (Steglatro), Bexagliflozin [employer]) or any combination with one of these drugs such as Invokamet (Canagliflozin/Metformin), Synjardy (Empagliflozin/Metformin), etc ("SGLT2 inhibitors") must be held around the time of a procedure. This is not a comprehensive list of all of these drugs. Please review all of your medications and talk  to your provider if you take any one of these. If you are not sure, ask your provider.   5.  The night before your procedure and the morning of your procedure scrub your neck/chest with CHG surgical scrub.  See instruction letter.  6. Plan to go home the same day, you will only stay overnight if medically necessary. 7. You MUST have a responsible adult to drive you home. 8. An adult MUST be with you the first 24 hours after you arrive home. 9. Bring a current list of your medications, and the last time and date medication taken. 10. Bring ID and current insurance cards. 11. Please wear clothes that are easy to get on and off and wear slip-on shoes.    You will follow up with the [facility] Device clinic 10-14 days after your procedure.  You will follow up with Dr. Rachelle Hora Croitoru 91 days after your procedure.  These appointments will be made for you.   * If you have ANY questions after you get home, please call the office at [phone] or send a MyChart message.  FYI: For your safety, and to allow [place] to monitor your vital signs accurately during the surgery/procedure we request that if you have artificial nails, gel coating, SNS etc. Please have those removed prior to your surgery/procedure. Not having the nail coverings /polish removed may result in cancellation or delay of your surgery/procedure.    [facility] - Preparing For Surgery    Before surgery, you can play an important role. Because skin is not sterile,  your skin needs to be as free of germs as possible. You can reduce the number of germs on your skin by washing with CHG (chlorahexidine gluconate) Soap before surgery.  CHG is an antiseptic cleaner which kills germs and bonds with the skin to continue killing germs even after washing.  Please do not use if you have an allergy to CHG or antibacterial soaps.  If your skin becomes reddened/irritated stop using the CHG.   Do not shave (including legs and underarms) for at least 48  hours prior to first CHG shower.  It is OK to shave your face.  Please follow these instructions carefully:  1.  Shower the night before surgery and the morning of surgery with CHG.  2.  If you choose to wash your hair, wash your hair first as usual with your normal shampoo.  3.  After you shampoo, rinse your hair and body thoroughly to remove the shampoo.  4.  Use CHG as you would any other liquid soap.  You can apply CHG directly to the skin and wash gently with a clean washcloth. 5.  Apply the CHG Soap to your body ONLY FROM THE NECK DOWN.  Do not use on open wounds or open sores.  Avoid contact with your eyes, ears, mouth and genitals (private parts).  Wash genitals (private parts) with your normal soap.  6.  Wash thoroughly, paying special attention to the area where your surgery will be performed.  7.  Thoroughly rinse your body with warm water from the neck down.   8.  DO NOT shower/wash with your normal soap after using and rinsing off the CHG soap.  9.  Pat yourself dry with a clean towel.           10.  Wear clean pajamas.           11.  Place clean sheets on your bed the night of your first shower and do not sleep with pets.  Day of Surgery: Do not apply any deodorants/lotions.  Please wear clean clothes to the hospital/surgery center.

## 2023-07-18 ENCOUNTER — Other Ambulatory Visit: Payer: Self-pay | Admitting: Cardiovascular Disease

## 2023-07-18 DIAGNOSIS — I482 Chronic atrial fibrillation, unspecified: Secondary | ICD-10-CM

## 2023-07-18 DIAGNOSIS — Z7901 Long term (current) use of anticoagulants: Secondary | ICD-10-CM

## 2023-07-22 NOTE — Progress Notes (Signed)
Remote pacemaker transmission.   

## 2023-07-23 NOTE — Telephone Encounter (Signed)
Went over all of the instructions with the patient. The only thing he has a question about is "Holding the Coumadin for 2 days before procedure." He said he thought Dr Royann Shivers said that he wouldn't need to hold it; or he would check the "levels" and see if it needed to be held. He reports that a family member had to hold anticoag med pre-procedure, then ended up having a stroke right after the procedure; he is worried about that happening to him.  Told him that I would send this question to Dr C. I assured him that Dr C will be following/checking on his INR levels and will surely make adjustments if needed. Told him that he is out of town for a few weeks, but will get back to him with an answer after he gets back.   He verbalized understanding.

## 2023-07-31 ENCOUNTER — Encounter: Payer: Self-pay | Admitting: Pharmacist

## 2023-08-11 ENCOUNTER — Ambulatory Visit (INDEPENDENT_AMBULATORY_CARE_PROVIDER_SITE_OTHER): Payer: PPO

## 2023-08-11 DIAGNOSIS — I442 Atrioventricular block, complete: Secondary | ICD-10-CM

## 2023-08-11 DIAGNOSIS — I482 Chronic atrial fibrillation, unspecified: Secondary | ICD-10-CM

## 2023-08-12 ENCOUNTER — Encounter: Payer: Self-pay | Admitting: Family

## 2023-08-12 ENCOUNTER — Ambulatory Visit (INDEPENDENT_AMBULATORY_CARE_PROVIDER_SITE_OTHER): Payer: PPO | Admitting: Family

## 2023-08-12 VITALS — BP 138/78 | HR 80 | Resp 18 | Ht 69.0 in | Wt 206.0 lb

## 2023-08-12 DIAGNOSIS — Z13220 Encounter for screening for lipoid disorders: Secondary | ICD-10-CM

## 2023-08-12 DIAGNOSIS — Z23 Encounter for immunization: Secondary | ICD-10-CM | POA: Diagnosis not present

## 2023-08-12 DIAGNOSIS — Z125 Encounter for screening for malignant neoplasm of prostate: Secondary | ICD-10-CM | POA: Diagnosis not present

## 2023-08-12 DIAGNOSIS — Z0000 Encounter for general adult medical examination without abnormal findings: Secondary | ICD-10-CM

## 2023-08-12 LAB — LIPID PANEL
Cholesterol: 120 mg/dL (ref 0–200)
HDL: 44.1 mg/dL (ref >39.00)
LDL Cholesterol: 64 mg/dL (ref 0–99)
NonHDL: 75.81
Total CHOL/HDL Ratio: 3
Triglycerides: 61 mg/dL (ref 0.0–149.0)
VLDL: 12.2 mg/dL (ref 0.0–40.0)

## 2023-08-12 LAB — PSA: PSA: 1.28 ng/mL (ref 0.10–4.00)

## 2023-08-12 NOTE — Progress Notes (Signed)
Benjamin Mckay is a 77 y.o. 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 Jude pacemaker 2013 11/06/2013   Chronic atrial fibrillation (HCC) 01/29/2013   Long term (current) use of anticoagulants 01/29/2013   HTN (hypertension) 02/01/2012   Carotid stenosis 02/01/2012   Renal artery stenosis (HCC) 02/01/2012   Coronary artery disease involving native coronary artery of native heart with angina pectoris (HCC) 02/01/2012   Esophageal reflux 08/21/2011   Hemorrhage of rectum and anus 04/26/2011   Umbilical hernia 05/22/2010   Cardiac dysrhythmia 11/11/2007   Transient cerebral ischemia 11/11/2007   ESOPHAGEAL STRICTURE 11/11/2007   Diverticulosis of colon 11/11/2007   Non-Hodgkin's lymphoma of intestine (HCC) 09/03/2007    Current Outpatient Medications  Medication Sig Dispense Refill   acetaminophen (TYLENOL) 500 MG tablet Take 1,000 mg by mouth every 6 (six) hours as needed for moderate pain (pain score 4-6).     empagliflozin (JARDIANCE) 10 MG TABS tablet Take 1 tablet (10 mg total) by mouth daily before breakfast. 30 tablet 11   ENTRESTO 24-26 MG TAKE 1 TABLET BY MOUTH TWICE A DAY 60 tablet 1   metoprolol succinate  (TOPROL-XL) 25 MG 24 hr tablet TAKE 1 TABLET BY MOUTH TWICE A DAY 180 tablet 3   pravastatin (PRAVACHOL) 80 MG tablet TAKE 1 TABLET BY MOUTH EVERY DAY 90 tablet 3   tamsulosin (FLOMAX) 0.4 MG CAPS capsule Take 0.4 mg by mouth at bedtime.     traZODone (DESYREL) 50 MG tablet TAKE 1/2 TO 1 TABLET BY MOUTH AT BEDTIME AS NEEDED FOR SLEEP 90 tablet 1   warfarin (COUMADIN) 2.5 MG tablet TAKE 1 TO 1.5 TABLETS BY MOUTH DAILY AS DIRECTED BY THE COUMADIN CLINIC 100 tablet 1   Evolocumab (REPATHA SURECLICK) 140 MG/ML SOAJ INJECT 140 MG (CONTENTS OF 1 INJECTOR) INTO THE SKIN EVERY 14 (FOURTEEN) DAYS (Patient not taking: Reported on 08/12/2023) 6 mL 3   fluticasone (FLONASE) 50 MCG/ACT nasal spray SPRAY 2 SPRAYS INTO EACH NOSTRIL EVERY DAY (Patient not taking: Reported on 08/12/2023) 48 mL 0   No current facility-administered medications for this visit.    Allergies: Statins, Adhesive [tape], Latex, and Penicillins  Past Medical History:  Diagnosis Date   Atrial fibrillation (HCC)    Cardiomyopathy, ischemic 11/07/2013   Cataract    bil cataracts removed   CHB (complete heart block) (HCC) 11/06/2013   Chronic combined systolic and diastolic CHF (congestive heart failure) (HCC)    Clotting disorder (HCC)    TIA , cva - PT ON COUMADINE   Colon polyps    Colon polyps    Congenital heart block    Constipation    CVA (cerebral infarction)    Diverticulosis    Esophageal stricture    Family history of adverse reaction to anesthesia    Heart murmur  Hyperlipidemia    Hypertension    Hyperthyroidism 11/16/2015   Lymphoma (HCC) 1998   of colon    Presence of permanent cardiac pacemaker    St. Jude  pt.states he is totally dependent on pacemaker   Shortness of breath dyspnea    Pulmonary Effusion   Stroke (HCC)    TIA, cva   Umbilical hernia    Urinary frequency     Past Surgical History:  Procedure Laterality Date   APPENDECTOMY  1953   CARDIAC CATHETERIZATION  01/06/2003   Recommend medical  therapy   CARDIOVASCULAR STRESS TEST  07/16/2012   Mild-moderate perfusion defect seen in Basal inferior, Mid inferior, and Apica lateral consistent with infarct/scar. No scintigraphic evidence for inducible myocardial ischemia. No ECG changes. EKG negative for ischemia.   CAROTID DOPPLER  11/04/2012   Proximal Rt ICA 50-99% diameter reduction; Lft Bulb demonstrated mild amount homogeneous plaque-not hemodynamically significant; Lft ICA-normal patency.   CHEST TUBE INSERTION Right 03/15/2016   Procedure: INSERTION PLEURAL DRAINAGE CATHETER;  Surgeon: Kerin Perna, MD;  Location[facility] OR;  Service: Thoracic;  Laterality: Right;   COLECTOMY     for lymphoma   COLONOSCOPY     neck fusion     PACEMAKER GENERATOR CHANGE  01/31/2012   St Jude Med [employer] DR RF model [id number] serial [id number]   PACEMAKER PLACEMENT     replaced 3 x   PERMANENT PACEMAKER GENERATOR CHANGE N/A 01/31/2012   Procedure: PERMANENT PACEMAKER GENERATOR CHANGE;  Surgeon: Thurmon Fair, MD;  Location: [facility] CATH LAB;    PLEURADESIS N/A 04/02/2016   Procedure: Cheron Every;  Surgeon: Kerin Perna, MD;  Location: [facility] OR;  Service: Thoracic;  Laterality: N/A;   REMOVAL OF PLEURAL DRAINAGE CATHETER Right 07/26/2016   Procedure: REMOVAL OF PLEURAL DRAINAGE CATHETER;  Surgeon: Kerin Perna, MD;  Location: [facility] OR;  Service: Thoracic;  Laterality: Right;   TONSILLECTOMY     TRANSTHORACIC ECHOCARDIOGRAM  11/04/2012   EF 35-40%, systolic function moderately reduced, mild regurg of the aortic and mitral valves.   VIDEO ASSISTED THORACOSCOPY Right 04/02/2016   Procedure: Right VIDEO ASSISTED THORACOSCOPY with Biopsies and drainage pleural effusion;  Surgeon: Kerin Perna, MD;  Location: [facility] OR;  Service: Thoracic;  Laterality: Right;    Family History  Problem Relation Age of Onset   Prostate cancer Father    Heart disease Mother    Stroke Mother    Diabetes Maternal Grandmother    Colon cancer Neg Hx    Heart attack Neg Hx    Esophageal  cancer Neg Hx    Stomach cancer Neg Hx    Rectal cancer Neg Hx     Social History   Tobacco Use   Smoking status: Never   Smokeless tobacco: Never  Substance Use Topics   Alcohol use: No    Alcohol/week: 0.0 standard drinks of alcohol    Subjective:   Presents for yearly follow up; no acute concerns today; would like a flu shot; working with cardiology very closely who is managing majority of healthcare needs;   Review of Systems  Constitutional: Negative.   HENT: Negative.    Eyes: Negative.   Respiratory: Negative.    Cardiovascular: Negative.   Gastrointestinal: Negative.   Genitourinary: Negative.   Musculoskeletal: Negative.   Skin: Negative.   Neurological: Negative.   Endo/Heme/Allergies: Negative.   Psychiatric/Behavioral: Negative.       Objective:  Vitals:   08/12/23 0945 08/12/23 1000  BP: (!) 140/78  138/78  Pulse: 80   Resp: 18   SpO2: 97%   Weight: 206 lb (93.4 kg)   Height: 5\' 9"  (1.753 m)     General: Well developed, well nourished, in no acute distress  Skin : Warm and dry.  Head: Normocephalic and atraumatic  Eyes: Sclera and conjunctiva clear; pupils round and reactive to light; extraocular movements intact  Ears: External normal; canals clear; tympanic membranes normal  Oropharynx: Pink, supple. No suspicious lesions  Neck: Supple without thyromegaly, adenopathy  Lungs: Respirations unlabored; clear to auscultation bilaterally without wheeze, rales, rhonchi  CVS exam: normal rate and regular rhythm.  Abdomen: Soft; nontender; nondistended; normoactive bowel sounds; no masses or hepatosplenomegaly  Musculoskeletal: No deformities; no active joint inflammation  Extremities: No edema, cyanosis, clubbing  Vessels: Symmetric bilaterally  Neurologic: Alert and oriented; speech intact; face symmetrical; moves all extremities well; CNII-XII intact without focal deficit   Assessment:  1. PE (physical exam), annual   2. Prostate cancer screening    3. Lipid screening   4. Need for influenza vaccination     Plan:  Age appropriate preventive healthcare needs addressed; encouraged regular eye doctor and dental exams; encouraged regular exercise; will update labs and refills as needed today; follow-up in 1 year, sooner prn.  Flu shot updated;    No follow-ups on file.  Orders Placed This Encounter  Procedures   Flu Vaccine Trivalent High Dose (Fluad)   PSA   Lipid panel    Requested Prescriptions    No prescriptions requested or ordered in this encounter

## 2023-08-13 ENCOUNTER — Encounter: Payer: Self-pay | Admitting: Pharmacist Clinician (PhC)/ Clinical Pharmacy Specialist

## 2023-08-13 ENCOUNTER — Ambulatory Visit: Payer: PPO | Attending: Internal Medicine | Admitting: Pharmacist Clinician (PhC)/ Clinical Pharmacy Specialist

## 2023-08-13 ENCOUNTER — Ambulatory Visit (INDEPENDENT_AMBULATORY_CARE_PROVIDER_SITE_OTHER): Payer: PPO | Admitting: *Deleted

## 2023-08-13 VITALS — BP 130/77 | HR 70

## 2023-08-13 DIAGNOSIS — I482 Chronic atrial fibrillation, unspecified: Secondary | ICD-10-CM | POA: Diagnosis not present

## 2023-08-13 DIAGNOSIS — I255 Ischemic cardiomyopathy: Secondary | ICD-10-CM | POA: Diagnosis not present

## 2023-08-13 DIAGNOSIS — Z7901 Long term (current) use of anticoagulants: Secondary | ICD-10-CM

## 2023-08-13 DIAGNOSIS — I5042 Chronic combined systolic (congestive) and diastolic (congestive) heart failure: Secondary | ICD-10-CM

## 2023-08-13 LAB — CUP PACEART REMOTE DEVICE CHECK
Battery Remaining Longevity: 1 mo
Battery Remaining Percentage: 0.5 %
Battery Voltage: 2.62 V
Brady Statistic RV Percent Paced: 59 %
Date Time Interrogation Session: 20241028025756
Implantable Lead Connection Status: 753985
Implantable Lead Implant Date: 20130419
Implantable Lead Location: 753860
Implantable Pulse Generator Implant Date: 20130419
Lead Channel Impedance Value: 440 Ohm
Lead Channel Pacing Threshold Amplitude: 0.75 V
Lead Channel Pacing Threshold Pulse Width: 0.5 ms
Lead Channel Sensing Intrinsic Amplitude: 8.6 mV
Lead Channel Setting Pacing Amplitude: 2.5 V
Lead Channel Setting Pacing Pulse Width: 0.5 ms
Lead Channel Setting Sensing Sensitivity: 2.5 mV
Pulse Gen Model: 1210
Pulse Gen Serial Number: 7313697

## 2023-08-13 LAB — POCT INR: INR: 2.1 (ref 2.0–3.0)

## 2023-08-13 MED ORDER — SPIRONOLACTONE 25 MG PO TABS: 12.5000 mg | ORAL_TABLET | ORAL | 1 refills | Status: DC

## 2023-08-13 MED ORDER — ENTRESTO 24-26 MG PO TABS: 1.0000 | ORAL_TABLET | Freq: Two times a day (BID) | ORAL | 1 refills | Status: DC

## 2023-08-13 NOTE — Progress Notes (Signed)
Office Visit    Patient Name: Benjamin Mckay Date of Encounter: 08/13/2023  Primary Care Provider:  Olive Bass, FNP Primary Cardiologist:  Thurmon Fair, MD  Chief Complaint    Heart Failure Medication Titration - EF 30-35%% (by echo 03/2023)  Significant Past Medical History   Heart block Pacemaker dependent  AF Chronic, CHADS2-VASc =5, on warfarin  CAD MI, RAS, carotid stenosis    Allergies  Allergen Reactions   Statins Other (See Comments)    Leg cramps.   Tolerates pravastatin.     Adhesive [Tape] Other (See Comments)    Skin irritation - please use paper tape   Latex Other (See Comments)    Skin irritation   Penicillins Rash    Has patient had a PCN reaction causing immediate rash, facial/tongue/throat swelling, SOB or lightheadedness with hypotension: Yes Has patient had a PCN reaction causing severe rash involving mucus membranes or skin necrosis: No Has patient had a PCN reaction that required hospitalization No Has patient had a PCN reaction occurring within the last 10 years: No If all of the above answers are "NO", then may proceed with Cephalosporin use.    History of Present Illness    Benjamin Mckay is a 77 y.o. male patient of Dr Royann Shivers, in the office today for heart failure medication management.   He had trouble initially when put on Entresto and empagliflozin, with some orthostatic hypotension.  His furosemide was switched to only as needed and he has done well since.   At his last visit with Dr. Royann Shivers BP was noted to be 140/84.  Previously he was seen by Carmela Hurt as well.  He notes that he only takes the [facility] once daily, as twice daily was dropping his systolic pressure to < 100.    Today he reports feeling well overall.  Is scheduled for a generator change on Nov 21.  Notes his BP has been much better with the Entresto once daily, no readings < 110 systolic.  Was into the 90's with bid dosing.    Blood Pressure Goal:   130/80  GDMT: ACEI/ARB/ARNI [x] Yes [] No Entresto 24-26 mg once daily  Beta blocker [x] Yes [] No Metoprolol succ 25 mg bid  MRA [] Yes [x] No   SGLT2 inhibitor [x] Yes [] No Empagliflozin 10 mg   Social Hx:      Tobacco: no  Alcohol:  no  Caffeine: Dr. Reino Kent - 3 + cans per day  Diet:   combination of home and eating out; doesn't eat as much,  just mid-morning and dinner;  variety of protein, eats a lot of meat, vegetables more starchy     Exercise: golf 18 holes weekly, does use cart  Home BP readings: mostly 130's/< 80   Adherence Assessment  Do you ever forget to take your medication? [] Yes [x] No  Do you ever skip doses due to side effects? [] Yes [x] No  Do you have trouble affording your medicines? [] Yes [x] No - now has [employer] grants  Are you ever unable to pick up your medication due to transportation difficulties? [] Yes [x] No  Do you ever stop taking your medications because you don't believe they are helping? [] Yes [x] No  Do you check your weight daily? [x] Yes [] No   Adherence strategy: pill box  Accessory Clinical Findings    Lab Results  Component Value Date   CREATININE 1.10 07/02/2023   BUN 11 07/02/2023   NA 140 07/02/2023   K 4.6 07/02/2023   CL 102 07/02/2023  CO2 27 07/02/2023   Lab Results  Component Value Date   ALT 15 08/06/2022   AST 20 08/06/2022   ALKPHOS 83 08/06/2022   BILITOT 1.5 (H) 08/06/2022   Lab Results  Component Value Date   HGBA1C 5.6 07/23/2018    Home Medications/Allergies    Current Outpatient Medications  Medication Sig Dispense Refill   spironolactone (ALDACTONE) 25 MG tablet Take 0.5 tablets (12.5 mg total) by mouth every other day. 15 tablet 1   acetaminophen (TYLENOL) 500 MG tablet Take 1,000 mg by mouth every 6 (six) hours as needed for moderate pain (pain score 4-6).     empagliflozin (JARDIANCE) 10 MG TABS tablet Take 1 tablet (10 mg total) by mouth daily before breakfast. 30 tablet 11   Evolocumab (REPATHA  SURECLICK) 140 MG/ML SOAJ INJECT 140 MG (CONTENTS OF 1 INJECTOR) INTO THE SKIN EVERY 14 (FOURTEEN) DAYS (Patient not taking: Reported on 08/12/2023) 6 mL 3   fluticasone (FLONASE) 50 MCG/ACT nasal spray SPRAY 2 SPRAYS INTO EACH NOSTRIL EVERY DAY (Patient not taking: Reported on 08/12/2023) 48 mL 0   metoprolol succinate (TOPROL-XL) 25 MG 24 hr tablet TAKE 1 TABLET BY MOUTH TWICE A DAY 180 tablet 3   pravastatin (PRAVACHOL) 80 MG tablet TAKE 1 TABLET BY MOUTH EVERY DAY 90 tablet 3   sacubitril-valsartan (ENTRESTO) 24-26 MG Take 1 tablet by mouth 2 (two) times daily. 180 tablet 1   tamsulosin (FLOMAX) 0.4 MG CAPS capsule Take 0.4 mg by mouth at bedtime.     traZODone (DESYREL) 50 MG tablet TAKE 1/2 TO 1 TABLET BY MOUTH AT BEDTIME AS NEEDED FOR SLEEP 90 tablet 1   warfarin (COUMADIN) 2.5 MG tablet TAKE 1 TO 1.5 TABLETS BY MOUTH DAILY AS DIRECTED BY THE COUMADIN CLINIC 100 tablet 1   No current facility-administered medications for this visit.     Allergies  Allergen Reactions   Statins Other (See Comments)    Leg cramps.   Tolerates pravastatin.     Adhesive [Tape] Other (See Comments)    Skin irritation - please use paper tape   Latex Other (See Comments)    Skin irritation   Penicillins Rash    Has patient had a PCN reaction causing immediate rash, facial/tongue/throat swelling, SOB or lightheadedness with hypotension: Yes Has patient had a PCN reaction causing severe rash involving mucus membranes or skin necrosis: No Has patient had a PCN reaction that required hospitalization No Has patient had a PCN reaction occurring within the last 10 years: No If all of the above answers are "NO", then may proceed with Cephalosporin use.       Assessment & Plan      Chronic combined systolic (congestive) and diastolic (congestive) heart failure (HCC) Assessment: BP in office today is 130/77 Patient with HFrEF  (30-35%) Tolerates current medications without any side effects Denies SOB,  palpitation, chest pain, headaches,or edema No concerns with regards to cost of medications, compliance, ability to pick up at pharmacy Reiterated the importance of regular exercise and low salt diet  Plan: GDMT ACEI/ARB/ARNI Entresto 24/26 every day  Consider re-trial of bid dosing after generator change  Beta blocker Metoprolo succ 25 mg bid   MRA Spironolactone 12.5 mg qod Start today  SGLT2 Empagliflozin 10 mg every day     Would consider decreasing metoprolol succ to 25 mg every day, but would probably have minimal impact on BP - not enough to increase Entresto to bid Labs ordered:  BMET - scheduled prior  to generator change Follow up with MD post procedure I will check in with him at next INR appointment to see if tolerating current meds   Phillips Hay PharmD CPP [facility]  [address] [place], [place] [address] [phone]

## 2023-08-13 NOTE — Assessment & Plan Note (Signed)
Assessment: BP in office today is 130/77 Patient with HFrEF  (30-35%) Tolerates current medications without any side effects Denies SOB, palpitation, chest pain, headaches,or edema No concerns with regards to cost of medications, compliance, ability to pick up at pharmacy Reiterated the importance of regular exercise and low salt diet  Plan: GDMT ACEI/ARB/ARNI Entresto 24/26 every day  Consider re-trial of bid dosing after generator change  Beta blocker Metoprolo succ 25 mg bid   MRA Spironolactone 12.5 mg qod Start today  SGLT2 Empagliflozin 10 mg every day     Would consider decreasing metoprolol succ to 25 mg every day, but would probably have minimal impact on BP - not enough to increase Entresto to bid Labs ordered:  BMET - scheduled prior to generator change Follow up with MD post procedure I will check in with him at next INR appointment to see if tolerating current meds

## 2023-08-13 NOTE — Patient Instructions (Addendum)
  Go to the lab in 10-14 days to check kidney function  Take your BP meds as follows:  Start spironolactone 12.5 mg every other day.    Check your blood pressure at home daily (if able) and keep record of the readings.  Hypertension "High blood pressure"  Hypertension is often called "The Silent Killer." It rarely causes symptoms until it is extremely  high or has done damage to other organs in the body. For this reason, you should have your  blood pressure checked regularly by your physician. We will check your blood pressure  every time you see a provider at one of our offices.   Your blood pressure reading consists of two numbers. Ideally, blood pressure should be  below 120/80. The first ("top") number is called the systolic pressure. It measures the  pressure in your arteries as your heart beats. The second ("bottom") number is called the diastolic pressure. It measures the pressure in your arteries as the heart relaxes between beats.  The benefits of getting your blood pressure under control are enormous. A 10-point  reduction in systolic blood pressure can reduce your risk of stroke by 27% and heart failure by 28%  Your blood pressure goal is < 130/80  To check your pressure at home you will need to:  1. Sit up in a chair, with feet flat on the floor and back supported. Do not cross your ankles or legs. 2. Rest your left arm so that the cuff is about heart level. If the cuff goes on your upper arm,  then just relax the arm on the table, arm of the chair or your lap. If you have a wrist cuff, we  suggest relaxing your wrist against your chest (think of it as Pledging the Flag with the  wrong arm).  3. Place the cuff snugly around your arm, about 1 inch above the crook of your elbow. The  cords should be inside the groove of your elbow.  4. Sit quietly, with the cuff in place, for about 5 minutes. After that 5 minutes press the power  button to start a reading. 5. Do not  talk or move while the reading is taking place.  6. Record your readings on a sheet of paper. Although most cuffs have a memory, it is often  easier to see a pattern developing when the numbers are all in front of you.  7. You can repeat the reading after 1-3 minutes if it is recommended  Make sure your bladder is empty and you have not had caffeine or tobacco within the last 30 min  Always bring your blood pressure log with you to your appointments. If you have not brought your monitor in to be double checked for accuracy, please bring it to your next appointment.  You can find a list of quality blood pressure cuffs at validatebp.org

## 2023-08-13 NOTE — Patient Instructions (Addendum)
Description   Continue taking warfarin 1.5 tablets every day.  Repeat INR in 1 week post gen change out. (normally 4 weeks) Anticoagulation Clinic [phone] or [phone]

## 2023-08-19 NOTE — Telephone Encounter (Signed)
Plan to do it on warfarin, but want to make sure INR is not too high (prefer <2.2). His INR last week was 2.1.  Please check INR 3 days before the procedure.

## 2023-08-29 NOTE — Progress Notes (Signed)
Remote pacemaker transmission.   

## 2023-08-29 NOTE — Addendum Note (Signed)
Addended by: Geralyn Flash D on: 08/29/2023 12:18 PM   Modules accepted: Level of Service

## 2023-09-01 ENCOUNTER — Ambulatory Visit: Payer: PPO | Attending: Cardiology

## 2023-09-01 ENCOUNTER — Other Ambulatory Visit: Payer: Self-pay

## 2023-09-01 DIAGNOSIS — Z7901 Long term (current) use of anticoagulants: Secondary | ICD-10-CM

## 2023-09-01 DIAGNOSIS — I482 Chronic atrial fibrillation, unspecified: Secondary | ICD-10-CM | POA: Diagnosis not present

## 2023-09-01 DIAGNOSIS — Z01812 Encounter for preprocedural laboratory examination: Secondary | ICD-10-CM | POA: Diagnosis not present

## 2023-09-01 LAB — POCT INR: INR: 1.8 — AB (ref 2.0–3.0)

## 2023-09-01 NOTE — Patient Instructions (Signed)
Continue taking warfarin 1.5 tablets every day. Generator Change 11/21 HOLD COUMADIN 11/19-11/20 Repeat INR in 1 week post gen change out. (normally 4 weeks) Anticoagulation Clinic [phone] or [phone]

## 2023-09-02 LAB — BASIC METABOLIC PANEL
BUN/Creatinine Ratio: 9 — ABNORMAL LOW (ref 10–24)
BUN: 11 mg/dL (ref 8–27)
CO2: 21 mmol/L (ref 20–29)
Calcium: 9.2 mg/dL (ref 8.6–10.2)
Chloride: 103 mmol/L (ref 96–106)
Creatinine, Ser: 1.22 mg/dL (ref 0.76–1.27)
Glucose: 86 mg/dL (ref 70–99)
Potassium: 5 mmol/L (ref 3.5–5.2)
Sodium: 139 mmol/L (ref 134–144)
eGFR: 61 mL/min/{1.73_m2} (ref >59)

## 2023-09-02 LAB — CBC
Hematocrit: 53.1 % — ABNORMAL HIGH (ref 37.5–51.0)
Hemoglobin: 18.1 g/dL — ABNORMAL HIGH (ref 13.0–17.7)
MCH: 33.3 pg — ABNORMAL HIGH (ref 26.6–33.0)
MCHC: 34.1 g/dL (ref 31.5–35.7)
MCV: 98 fL — ABNORMAL HIGH (ref 79–97)
Platelets: 188 10*3/uL (ref 150–450)
RBC: 5.44 x10E6/uL (ref 4.14–5.80)
RDW: 12.7 % (ref 11.6–15.4)
WBC: 6.6 10*3/uL (ref 3.4–10.8)

## 2023-09-04 ENCOUNTER — Other Ambulatory Visit: Payer: Self-pay

## 2023-09-04 ENCOUNTER — Ambulatory Visit (HOSPITAL_COMMUNITY)
Admission: RE | Disposition: A | Discharge status: Home / Self Care | Payer: Self-pay | Source: Home / Self Care | Attending: Cardiovascular Disease

## 2023-09-04 ENCOUNTER — Ambulatory Visit (HOSPITAL_COMMUNITY)
Admission: RE | Admit: 2023-09-04 | Discharge: 2023-09-04 | Disposition: A | Discharge status: Home / Self Care | Payer: PPO | Attending: Cardiovascular Disease | Admitting: Cardiovascular Disease

## 2023-09-04 DIAGNOSIS — I480 Paroxysmal atrial fibrillation: Secondary | ICD-10-CM | POA: Diagnosis not present

## 2023-09-04 DIAGNOSIS — Z45010 Encounter for checking and testing of cardiac pacemaker pulse generator [battery]: Secondary | ICD-10-CM | POA: Diagnosis not present

## 2023-09-04 DIAGNOSIS — I110 Hypertensive heart disease with heart failure: Secondary | ICD-10-CM | POA: Diagnosis not present

## 2023-09-04 DIAGNOSIS — Z7901 Long term (current) use of anticoagulants: Secondary | ICD-10-CM

## 2023-09-04 DIAGNOSIS — J90 Pleural effusion, not elsewhere classified: Secondary | ICD-10-CM | POA: Insufficient documentation

## 2023-09-04 DIAGNOSIS — I442 Atrioventricular block, complete: Secondary | ICD-10-CM | POA: Insufficient documentation

## 2023-09-04 DIAGNOSIS — I2510 Atherosclerotic heart disease of native coronary artery without angina pectoris: Secondary | ICD-10-CM | POA: Diagnosis not present

## 2023-09-04 DIAGNOSIS — I5022 Chronic systolic (congestive) heart failure: Secondary | ICD-10-CM | POA: Diagnosis not present

## 2023-09-04 DIAGNOSIS — Z79899 Other long term (current) drug therapy: Secondary | ICD-10-CM | POA: Diagnosis not present

## 2023-09-04 DIAGNOSIS — I252 Old myocardial infarction: Secondary | ICD-10-CM | POA: Diagnosis not present

## 2023-09-04 DIAGNOSIS — I4891 Unspecified atrial fibrillation: Secondary | ICD-10-CM

## 2023-09-04 HISTORY — PX: PPM GENERATOR CHANGEOUT: EP1233

## 2023-09-04 LAB — PROTIME-INR
INR: 1.8 — ABNORMAL HIGH (ref 0.8–1.2)
Prothrombin Time: 21.4 s — ABNORMAL HIGH (ref 11.4–15.2)

## 2023-09-04 SURGERY — PPM GENERATOR CHANGEOUT

## 2023-09-04 MED ORDER — CEFAZOLIN SODIUM-DEXTROSE 2-4 GM/100ML-% IV SOLN
INTRAVENOUS | Status: AC
  Filled 2023-09-04: qty 100

## 2023-09-04 MED ORDER — ACETAMINOPHEN 325 MG PO TABS: 325.0000 mg | ORAL_TABLET | ORAL | Status: DC | PRN

## 2023-09-04 MED ORDER — CHLORHEXIDINE GLUCONATE 4 % EX SOLN: 4.0000 | Freq: Once | CUTANEOUS | Status: DC

## 2023-09-04 MED ORDER — LIDOCAINE HCL (PF) 1 % IJ SOLN
INTRAMUSCULAR | Status: AC
  Filled 2023-09-04: qty 60

## 2023-09-04 MED ORDER — SODIUM CHLORIDE 0.9 % IV SOLN: INTRAVENOUS | Status: DC | PRN

## 2023-09-04 MED ORDER — FENTANYL CITRATE (PF) 100 MCG/2ML IJ SOLN
INTRAMUSCULAR | Status: AC
  Filled 2023-09-04: qty 2

## 2023-09-04 MED ORDER — SODIUM CHLORIDE 0.9% FLUSH: 3.0000 mL | INTRAVENOUS | Status: DC | PRN

## 2023-09-04 MED ORDER — ONDANSETRON HCL 4 MG/2ML IJ SOLN: 4.0000 mg | Freq: Four times a day (QID) | INTRAMUSCULAR | Status: DC | PRN

## 2023-09-04 MED ORDER — SODIUM CHLORIDE 0.9 % IV SOLN
INTRAVENOUS | Status: AC
  Filled 2023-09-04: qty 2

## 2023-09-04 MED ORDER — CEFAZOLIN SODIUM-DEXTROSE 2-4 GM/100ML-% IV SOLN
2.0000 g | INTRAVENOUS | Status: AC
  Administered 2023-09-04: 2 g via INTRAVENOUS

## 2023-09-04 MED ORDER — LIDOCAINE HCL (PF) 1 % IJ SOLN
INTRAMUSCULAR | Status: DC | PRN
  Administered 2023-09-04: 60 mL via INTRADERMAL

## 2023-09-04 MED ORDER — MIDAZOLAM HCL 2 MG/2ML IJ SOLN
INTRAMUSCULAR | Status: AC
Start: 2023-09-04 — End: ?
  Filled 2023-09-04: qty 2

## 2023-09-04 MED ORDER — SODIUM CHLORIDE 0.9 % IV SOLN
80.0000 mg | INTRAVENOUS | Status: AC
  Administered 2023-09-04: 80 mg

## 2023-09-04 MED ORDER — FENTANYL CITRATE (PF) 100 MCG/2ML IJ SOLN
INTRAMUSCULAR | Status: DC | PRN
  Administered 2023-09-04: 25 ug via INTRAVENOUS

## 2023-09-04 MED ORDER — MIDAZOLAM HCL 5 MG/5ML IJ SOLN
INTRAMUSCULAR | Status: DC | PRN
  Administered 2023-09-04: 1 mg via INTRAVENOUS

## 2023-09-04 SURGICAL SUPPLY — 6 items
CABLE SURGICAL S-101-97-12 (CABLE) ×1 IMPLANT
PACEMAKER ASSURITY SR-SF (Pacemaker) IMPLANT
PAD DEFIB RADIO PHYSIO CONN (PAD) ×1 IMPLANT
POUCH AIGIS-R ANTIBACT PPM (Mesh General) ×1 IMPLANT
POUCH AIGIS-R ANTIBACT PPM MED (Mesh General) IMPLANT
TRAY PACEMAKER INSERTION (PACKS) ×1 IMPLANT

## 2023-09-04 NOTE — Op Note (Signed)
Procedure report  Procedure performed:  1. Single chamber pacemaker generator changeout  2. Aigis pouch 3. Sedation  Reason for procedure:  1. Device generator at elective replacement interval  2. Atrial fibrillation with slow ventricular response 3. Complete heart block Procedure performed by:  Thurmon Fair, MD  Complications:  None  Estimated blood loss:  10 mL  Medications administered during procedure:  Ancef 1 g intravenously,  lidocaine 1% 30 mL locally, fentanyl 25 mcg intravenously, Versed 1 mg intravenously.  During this procedure the patient is administered a total of Versed 1 mg and Fentanyl 25 mcg to achieve and maintain moderate conscious sedation.  The patient's heart rate, blood pressure, and oxygen saturation are monitored continuously during the procedure. The period of conscious sedation is 34 minutes, of which I was present face-to-face 100% of this time. Derinda, RN is an independent, trained observer who assisted in the monitoring of the patient's level of consciousness.    Device details:   [employer], model number 1272, serial number [id number] Right atrial lead (chronic, capped)  Right ventricular lead (chronic)  [employer], model number Tendril STS 2088TC-58  , serial number [id number] (implanted 01/31/2012)  Explanted generator [employer] Accent SR 1210,   serial number  [id number] (implanted 01/31/2012)  Procedure details:  After the risks and benefits of the procedure were discussed the patient provided informed consent. She was brought to the cardiac catheter lab in the fasting state. The patient was prepped and draped in usual sterile fashion. Local anesthesia with 1% lidocaine was administered to to the left infraclavicular area. A 5-6cm horizontal incision was made parallel with and 2-3 cm caudal to the left clavicle, in the area of an old scar. Two older scars were seen even more distally to the left clavicle. Using minimal electrocautery and  mostly sharp and blunt dissection the prepectoral pocket was opened carefully to avoid injury to the loops of chronic leads. Extensive dissection was necessary. The device was explanted. The pocket was carefully inspected for hemostasis and flushed with copious amounts of antibiotic solution.  The lead was disconnected from the old generator and connected to the new generator , with appropriate pacing noted. Telemetry testing showed normal parameters.   Thedevice was placed in an Aigis pouch and the entire system was then carefully inserted in the pocket with care been taking that both the active ventricular lead and the capped atrial lead were located deep to the generator and the device assumed a comfortable position without pressure on the incision. The pocket was then closed in layers using 2 layers of 2-0 Vicryl, one layer of 3-0 Vicryl and cutaneous steristrips after which a sterile dressing was applied.   At the end of the procedure the following lead parameters were encountered:   Right ventricular lead sensed R waves none detected, impedance 440 ohms, threshold 0.75 at 0.4 ms pulse width.  Thurmon Fair, MD, [facility] [phone] office [phone] pager

## 2023-09-04 NOTE — H&P (Signed)
Cardiology Admission History and Physical   Patient ID: Benjamin Mckay MRN: [id number]; DOB: [birth year]   Admission date: 09/04/2023  PCP:  Olive Bass, FNP   [facility] Providers Cardiologist:  Thurmon Fair, MD        Chief Complaint:  pacemaker battery depletion  Patient Profile:   Benjamin Mckay is a 77 y.o. male with paroxysmal complete heart block and atrial fibrillation with slow ventricular response who is being seen 09/04/2023 for the evaluation of pacemaker battery depletion.   History of Present Illness:   Mr. Shippen has a complex cardiac history (intermittent complete heart block-pacemaker dependent, chronic atrial fibrillation versus atrial standstill, history of sustained ventricular tachycardia, coronary artery disease with history of inferior wall scar due to myocardial infarction, renal and carotid artery stenosis, LVEF 40% with compensated HFrEF, amiodarone induced hyperthyroidism (now quiescent) and a recurrent right pleural effusion (s/p pleurodesis June 2017).  He has a single-chamber pacemaker that was implanted many years ago, but surprisingly has had documented sinus rhythm on at least 1 occasion in the last few years (see ECG 01/14/2022).  He does have an atrial lead in place which was capped at a generator change out in 2004.   He usually has 60% RV pacing on the average. He has rare episodes of RVR.  His pacemaker reached ERI recently. Sometimes his native rhythm appears very regular (See ECG from 12/19/2017 - AFlutter with 2:1 AV block, 12/11/2016 - accelerated junctional  or ventricular rhythm, 01/14/2022 - sinus rhythm).  PET scan in August showed EF 36-40%, apical inferior scar without ischemia, normal global myocardial blood flow reserve.   Past Medical History:  Diagnosis Date   Atrial fibrillation (HCC)    Cardiomyopathy, ischemic 11/07/2013   Cataract    bil cataracts removed   CHB (complete heart block) (HCC) 11/06/2013    Chronic combined systolic and diastolic CHF (congestive heart failure) (HCC)    Clotting disorder (HCC)    TIA , cva - PT ON COUMADINE   Colon polyps    Colon polyps    Congenital heart block    Constipation    CVA (cerebral infarction)    Diverticulosis    Esophageal stricture    Family history of adverse reaction to anesthesia    Heart murmur    Hyperlipidemia    Hypertension    Hyperthyroidism 11/16/2015   Lymphoma (HCC) 1998   of colon    Presence of permanent cardiac pacemaker    [employer]  pt.states he is totally dependent on pacemaker   Shortness of breath dyspnea    Pulmonary Effusion   Stroke (HCC)    TIA, cva   Umbilical hernia    Urinary frequency     Past Surgical History:  Procedure Laterality Date   APPENDECTOMY  1953   CARDIAC CATHETERIZATION  01/06/2003   Recommend medical therapy   CARDIOVASCULAR STRESS TEST  07/16/2012   Mild-moderate perfusion defect seen in Basal inferior, Mid inferior, and Apica lateral consistent with infarct/scar. No scintigraphic evidence for inducible myocardial ischemia. No ECG changes. EKG negative for ischemia.   CAROTID DOPPLER  11/04/2012   Proximal Rt ICA 50-99% diameter reduction; Lft Bulb demonstrated mild amount homogeneous plaque-not hemodynamically significant; Lft ICA-normal patency.   CHEST TUBE INSERTION Right 03/15/2016   Procedure: INSERTION PLEURAL DRAINAGE CATHETER;  Surgeon: Kerin Perna, MD;  Location: [facility];  Service: Thoracic;  Laterality: Right;   COLECTOMY     for lymphoma   COLONOSCOPY  neck fusion     PACEMAKER GENERATOR CHANGE  01/31/2012   [employer] Jude Med Accent DR RF model [id number] serial [phone]   PACEMAKER PLACEMENT     replaced 3 x   PERMANENT PACEMAKER GENERATOR CHANGE N/A 01/31/2012   Procedure: PERMANENT PACEMAKER GENERATOR CHANGE;  Surgeon: Thurmon Fair, MD;  Location: [facility] CATH LAB;    PLEURADESIS N/A 04/02/2016   Procedure: Cheron Every;  Surgeon: Kerin Perna, MD;  Location: [facility] OR;  Service:  Thoracic;  Laterality: N/A;   REMOVAL OF PLEURAL DRAINAGE CATHETER Right 07/26/2016   Procedure: REMOVAL OF PLEURAL DRAINAGE CATHETER;  Surgeon: Kerin Perna, MD;  Location: [facility] OR;  Service: Thoracic;  Laterality: Right;   TONSILLECTOMY     TRANSTHORACIC ECHOCARDIOGRAM  11/04/2012   EF 35-40%, systolic function moderately reduced, mild regurg of the aortic and mitral valves.   VIDEO ASSISTED THORACOSCOPY Right 04/02/2016   Procedure: Right VIDEO ASSISTED THORACOSCOPY with Biopsies and drainage pleural effusion;  Surgeon: Kerin Perna, MD;  Location: [facility] OR;  Service: Thoracic;  Laterality: Right;     Medications Prior to Admission: Prior to Admission medications   Medication Sig Start Date End Date Taking? Authorizing Provider  acetaminophen (TYLENOL) 500 MG tablet Take 1,000 mg by mouth every 6 (six) hours as needed for moderate pain (pain score 4-6).   Yes [provider]  empagliflozin (JARDIANCE) 10 MG TABS tablet Take 1 tablet (10 mg total) by mouth daily before breakfast. 05/26/23  Yes Jonathandavid Marlett, MD  Evolocumab (REPATHA SURECLICK) 140 MG/ML SOAJ INJECT 140 MG (CONTENTS OF 1 INJECTOR) INTO THE SKIN EVERY 14 (FOURTEEN) DAYS 04/30/23  Yes Gricelda Foland, MD  hydrocortisone cream 1 % Apply 1 Application topically 2 (two) times daily as needed for itching.   Yes [provider]  metoprolol succinate (TOPROL-XL) 25 MG 24 hr tablet TAKE 1 TABLET BY MOUTH TWICE A DAY 04/08/23  Yes Sephiroth Mcluckie, MD  pravastatin (PRAVACHOL) 80 MG tablet TAKE 1 TABLET BY MOUTH EVERY DAY 01/29/23  Yes Jasiel Apachito, MD  sacubitril-valsartan (ENTRESTO) 24-26 MG Take 1 tablet by mouth 2 (two) times daily. Patient taking differently: Take 1 tablet by mouth daily. 08/13/23  Yes Deshara Rossi, MD  spironolactone (ALDACTONE) 25 MG tablet Take 0.5 tablets (12.5 mg total) by mouth every other day. 08/13/23 11/11/23 Yes Evanie Buckle, MD  tamsulosin (FLOMAX) 0.4 MG CAPS capsule Take 0.4 mg by  mouth at bedtime.   Yes [provider]  traZODone (DESYREL) 50 MG tablet TAKE 1/2 TO 1 TABLET BY MOUTH AT BEDTIME AS NEEDED FOR SLEEP Patient taking differently: Take 50 mg by mouth at bedtime. 06/23/23  Yes Olive Bass, FNP  warfarin (COUMADIN) 2.5 MG tablet TAKE 1 TO 1.5 TABLETS BY MOUTH DAILY AS DIRECTED BY THE COUMADIN CLINIC Patient taking differently: Take 3.75 mg by mouth daily at 4 PM. 07/18/23  Yes Doreene Forrey, Rachelle Hora, MD     Allergies:    Allergies  Allergen Reactions   Statins Other (See Comments)    Leg cramps.   Tolerates pravastatin.     Adhesive [Tape] Other (See Comments)    Skin irritation - please use paper tape   Latex Other (See Comments)    Skin irritation   Penicillins Rash    Social History:   Social History   Socioeconomic History   Marital status: Married    Spouse name: Research officer, political party   Number of children: 3   Years of education: 16   Highest education level:  Not on file  Occupational History   Occupation: Retired  Tobacco Use   Smoking status: Never   Smokeless tobacco: Never  Vaping Use   Vaping status: Never Used  Substance and Sexual Activity   Alcohol use: No    Alcohol/week: 0.0 standard drinks of alcohol   Drug use: No   Sexual activity: Yes  Other Topics Concern   Not on file  Social History Narrative   Fun: Golf when he can   Consumes caffeine 2 cups per day     Social Determinants of Health   Financial Resource Strain: Low Risk  (08/07/2020)   Overall Financial Resource Strain (CARDIA)    Difficulty of Paying Living Expenses: Not hard at all  Food Insecurity: No Food Insecurity (08/07/2020)   Hunger Vital Sign    Worried About Running Out of Food in the Last Year: Never true    Ran Out of Food in the Last Year: Never true  Transportation Needs: No Transportation Needs (08/07/2020)   PRAPARE - Administrator, Civil Service (Medical): No    Lack of Transportation (Non-Medical): No  Physical  Activity: Sufficiently Active (08/07/2020)   Exercise Vital Sign    Days of Exercise per Week: 5 days    Minutes of Exercise per Session: 30 min  Stress: No Stress Concern Present (08/07/2020)   [employer] of Occupational Health - Occupational Stress Questionnaire    Feeling of Stress : Not at all  Social Connections: Socially Integrated (08/07/2020)   Social Connection and Isolation Panel [NHANES]    Frequency of Communication with Friends and Family: More than three times a week    Frequency of Social Gatherings with Friends and Family: More than three times a week    Attends Religious Services: More than 4 times per year    Active Member of [employer] or Organizations: Yes    Attends Engineer, structural: More than 4 times per year    Marital Status: Married  Catering manager Violence: Not At Risk (08/07/2020)   Humiliation, Afraid, Rape, and Kick questionnaire    Fear of Current or Ex-Partner: No    Emotionally Abused: No    Physically Abused: No    Sexually Abused: No    Family History:   The patient's family history includes Diabetes in his maternal grandmother; Heart disease in his mother; Prostate cancer in his father; Stroke in his mother. There is no history of Colon cancer, Heart attack, Esophageal cancer, Stomach cancer, or Rectal cancer.    ROS:  Please see the history of present illness.  All other ROS reviewed and negative.     Physical Exam/Data:   Vitals:   09/04/23 1237  BP: (!) 165/88  Pulse: 65  Resp: 18  Temp: 97.9 F (36.6 C)  TempSrc: Oral  SpO2: 97%  Weight: 92.1 kg  Height: 5\' 9"  (1.753 m)   No intake or output data in the 24 hours ending 09/04/23 1246    09/04/2023   12:37 PM 08/12/2023    9:45 AM 07/17/2023    9:12 AM  Last 3 Weights  Weight (lbs) 203 lb 206 lb 208 lb 9.6 oz  Weight (kg) 92.08 kg 93.441 kg 94.62 kg     Body mass index is 29.98 kg/m.  General: Alert, oriented x3, no distress, healthy left subclavian pacemaker  site Head: no evidence of trauma, PERRL, EOMI, no exophtalmos or lid lag, no myxedema, no xanthelasma; normal ears, nose and oropharynx Neck: normal  jugular venous pulsations and no hepatojugular reflux; brisk carotid pulses without delay and no carotid bruits Chest: Diminished breath sounds in right lung base; clear to auscultation, no signs of consolidation by percussion or palpation, normal fremitus, symmetrical and full respiratory excursions Cardiovascular: normal position and quality of the apical impulse, irregular rhythm, normal first and paradoxically split second heart sounds, no murmurs, rubs or gallops Abdomen: no tenderness or distention, no masses by palpation, no abnormal pulsatility or arterial bruits, normal bowel sounds, no hepatosplenomegaly Extremities: Varicose veins, but no current edema, normal distal pulses Neurological: grossly nonfocal Psych: Normal mood and affect     Relevant CV Studies: PET scan 06/12/2023    Severe fixed basal to apical inferior perfusion defect with hypokinesis in this region, consistent with infarct.  No ischemia.  No high risk findings such as TID or drop in EF with stress (EF 36% at rest, 40% at stress), and myocardial blood flow reserve is normal.  Overall, intermediate risk study due to systolic dysfunction.   EKG not recorded during study   LV perfusion is abnormal. There is no evidence of ischemia. There is evidence of infarction. Defect 1: There is a medium defect with severe reduction in uptake present in the apical to basal inferior location(s) that is fixed. There is abnormal wall motion in the defect area. Consistent with infarction. The defect is consistent with abnormal perfusion in the RCA territory.   Rest left ventricular function is abnormal. Rest EF: 36%. Stress left ventricular function is abnormal. Stress EF: 40%. End diastolic cavity size is normal. End systolic cavity size is normal.   Myocardial blood flow was computed to be  0.11ml/g/min at rest and 1.63ml/g/min at stress. Global myocardial blood flow reserve was 2.67 and was normal.   Coronary calcium was present on the attenuation correction CT images. Moderate coronary calcifications were present. Coronary calcifications were present in the left anterior descending artery, left circumflex artery and right coronary artery distribution(s).   Findings are consistent with infarction. The study is intermediate risk.   Electronically signed by Epifanio Lesches, MD    Laboratory Data:  High Sensitivity Troponin:  No results for input(s): "TROPONINIHS" in the last 720 hours.    Chemistry Recent Labs  Lab 09/01/23 1624  NA 139  K 5.0  CL 103  CO2 21  GLUCOSE 86  BUN 11  CREATININE 1.22  CALCIUM 9.2    No results for input(s): "PROT", "ALBUMIN", "AST", "ALT", "ALKPHOS", "BILITOT" in the last 168 hours. Lipids No results for input(s): "CHOL", "TRIG", "HDL", "LABVLDL", "LDLCALC", "CHOLHDL" in the last 168 hours. Hematology Recent Labs  Lab 09/01/23 1624  WBC 6.6  RBC 5.44  HGB 18.1*  HCT 53.1*  MCV 98*  MCH 33.3*  MCHC 34.1  RDW 12.7  PLT 188   Thyroid No results for input(s): "TSH", "FREET4" in the last 168 hours. BNPNo results for input(s): "BNP", "PROBNP" in the last 168 hours.  DDimer No results for input(s): "DDIMER" in the last 168 hours.   Radiology/Studies:  No results found.   Assessment and Plan:   Pacemaker at [facility] History of CHB.  Here for generator change. This procedure has been fully reviewed with the patient and written informed consent has been obtained.    Risk Assessment/Risk Scores:       New York Heart Association (NYHA) Functional Class NYHA Class I  CHA2DS2-VASc Score = 5   This indicates a 7.2% annual risk of stroke. The patient's score is based upon:  CHF History: 1 HTN History: 1 Diabetes History: 0 Stroke History: 0 Vascular Disease History: 1 Age Score: 2 Gender Score: 0     Code Status: Full  Code   For questions or updates, please contact [facility] Please consult [email or web address] for contact info under     Signed, Thurmon Fair, MD  09/04/2023 12:46 PM

## 2023-09-04 NOTE — Interval H&P Note (Signed)
History and Physical Interval Note:  09/04/2023 1:55 PM  Benjamin Mckay [facility]  has presented today for surgery, with the diagnosis of afib with slow ventricular response.  The various methods of treatment have been discussed with the patient and family. After consideration of risks, benefits and other options for treatment, the patient has consented to  Procedure(s): PPM GENERATOR CHANGEOUT (N/A) as a surgical intervention.  The patient's history has been reviewed, patient examined, no change in status, stable for surgery.  I have reviewed the patient's chart and labs.  Questions were answered to the patient's satisfaction.     Benjamin Mckay

## 2023-09-04 NOTE — Discharge Instructions (Addendum)

## 2023-09-05 ENCOUNTER — Other Ambulatory Visit: Payer: Self-pay | Admitting: Cardiovascular Disease

## 2023-09-05 ENCOUNTER — Encounter (HOSPITAL_COMMUNITY): Payer: Self-pay | Admitting: Cardiovascular Disease

## 2023-09-05 ENCOUNTER — Telehealth: Payer: Self-pay | Admitting: Cardiovascular Disease

## 2023-09-05 MED FILL — Midazolam HCl Inj 2 MG/2ML (Base Equivalent): INTRAMUSCULAR | Qty: 1 | Status: AC

## 2023-09-05 NOTE — Telephone Encounter (Signed)
Spoke with Dr Royann Shivers and he said that the pressure dressing may be taken off. The tegaderm underneath the pressure dressing may be left on- can remove that one tomorrow.  Went over the information above with the patient and he verbalized understanding. Told that the tegaderm is the clear/plastic-looking piece- the pressure dressing above it may be d/c'd today and the tegaderm d/c'd tomorrow.

## 2023-09-05 NOTE — Telephone Encounter (Signed)
Pt states he has some questions regarding his procedure that he had done yesterday. Please advise

## 2023-09-05 NOTE — Telephone Encounter (Signed)
The patient reports that he got a Pressure Dressing Placed yesterday after his Generator change out and did not receive any directions of how long to leave it on and what he can take for pain. He said that he never got a pressure dressing with his others and it is uncomfortable.  Informed him that he can take tylenol for discomfort and he should be able to take the bandage off in 48 hours, but I will touch base with Dr Royann Shivers to see if there is any other instruction/if he can take the pressure bandage off before 48 hours due to discomfort. Will call him back with recommendations.

## 2023-09-10 ENCOUNTER — Telehealth: Payer: Self-pay

## 2023-09-10 ENCOUNTER — Ambulatory Visit: Payer: PPO | Attending: Cardiology

## 2023-09-10 DIAGNOSIS — Z5181 Encounter for therapeutic drug level monitoring: Secondary | ICD-10-CM | POA: Diagnosis not present

## 2023-09-10 DIAGNOSIS — I482 Chronic atrial fibrillation, unspecified: Secondary | ICD-10-CM | POA: Diagnosis not present

## 2023-09-10 LAB — POCT INR: INR: 2.5 (ref 2.0–3.0)

## 2023-09-10 NOTE — Telephone Encounter (Signed)
Called pt to get transmission before DC appt 12/4. Pt states he will send transmission, I have let pt know I will call back if not received and pt aware on how to send it

## 2023-09-10 NOTE — Patient Instructions (Signed)
Description   Continue taking warfarin 1.5 tablets every day. Repeat INR in 3 weeks.  Anticoagulation Clinic [phone] or [phone]

## 2023-09-15 NOTE — Patient Instructions (Incomplete)
   After Your Pacemaker   Monitor your pacemaker site for redness, swelling, and drainage. Call the device clinic at [phone] if you experience these symptoms or fever/chills.  Your incision was closed with Steri-strips or staples:  You may shower 7 days after your procedure and wash your incision with soap and water. Avoid lotions, ointments, or perfumes over your incision until it is well-healed.  You may use a hot tub or a pool after your wound check appointment if the incision is completely closed.   There are no other restrictions in arm movement after your wound check appointment.  You may drive, unless driving has been restricted by your healthcare providers.  Remote monitoring is used to monitor your pacemaker from home. This monitoring is scheduled every 91 days by our office. It allows [place] to keep an eye on the functioning of your device to ensure it is working properly. You will routinely see your Electrophysiologist annually (more often if necessary).

## 2023-09-17 ENCOUNTER — Telehealth: Payer: Self-pay

## 2023-09-17 ENCOUNTER — Ambulatory Visit: Payer: PPO | Attending: Internal Medicine

## 2023-09-17 DIAGNOSIS — I442 Atrioventricular block, complete: Secondary | ICD-10-CM

## 2023-09-17 DIAGNOSIS — I482 Chronic atrial fibrillation, unspecified: Secondary | ICD-10-CM

## 2023-09-17 DIAGNOSIS — I5022 Chronic systolic (congestive) heart failure: Secondary | ICD-10-CM

## 2023-09-17 NOTE — Telephone Encounter (Signed)
Outreach made to Pt.  Pt appt made with RU for this Friday for pressure dressing removal and wound recheck.  Will schedule INR next week per SK.

## 2023-09-18 LAB — CUP PACEART INCLINIC DEVICE CHECK
Battery Remaining Longevity: 116 mo
Battery Voltage: 3.05 V
Brady Statistic RV Percent Paced: 77 %
Date Time Interrogation Session: 20241204094400
Implantable Lead Connection Status: 753985
Implantable Lead Implant Date: 20130419
Implantable Lead Location: 753860
Implantable Pulse Generator Implant Date: 20241121
Lead Channel Impedance Value: 425 Ohm
Lead Channel Pacing Threshold Amplitude: 0.75 V
Lead Channel Pacing Threshold Amplitude: 0.75 V
Lead Channel Pacing Threshold Pulse Width: 0.5 ms
Lead Channel Pacing Threshold Pulse Width: 0.5 ms
Lead Channel Sensing Intrinsic Amplitude: 10.6 mV
Lead Channel Setting Pacing Amplitude: 2.5 V
Lead Channel Setting Pacing Pulse Width: 0.5 ms
Lead Channel Setting Sensing Sensitivity: 2.5 mV
Pulse Gen Model: 2272
Pulse Gen Serial Number: 8208618

## 2023-09-18 NOTE — Progress Notes (Signed)
Normal device function. Thresholds, sensing, and impedances consistent with implant measurements. Device programmed at chronic outputs post generator change. Histogram distribution appropriate for patient and level of activity. No mode switches or high ventricular rates noted. Patient educated about wound care. ROV in 3 months with implanting physician.  Wound check appointment. Steri-strips removed. Wound without redness.  Hematoma noted at incision site.  Hematoma is firm to touch. Incision edges approximated.  No s/s of infection.  No drainage.  Site evaluted with Dr. Graciela Husbands.  Pt currently taking warfarin for atrial fibrillation with history of stroke.    Dr. Graciela Husbands requests INR check.  Pharmacist completed and current INR 2.1.  Per Dr. Levy Pupa 1/2 dose of warfarin tonight and then return to normal dosing.  Per Dr. Lilian Coma pressure dressing and have Pt return for evaluation Friday or Monday.  Follow up scheduled with EP APP for 09/19/2023.  Pressure dressing applied.  Pt needs INR scheduled for week of 09/22/2023 per Dr. Graciela Husbands.  Will get this scheduled.

## 2023-09-18 NOTE — Progress Notes (Signed)
Cardiology Office Note:  .   Date:  09/18/2023  ID:  Benjamin Mckay, DOB [birth year], MRN [id number] PCP: Olive Bass, FNP  [facility] Providers Cardiologist:  Thurmon Fair, MD {  History of Present Illness: .   Benjamin Mckay is a 77 y.o. male w/PMHx of  --CHB w/PPM --VT (sustained),  --CAD (+MI hx) --PVD w/ RAS and carotid disease --Amiodarone > hyperthyroidism (better) --recurrent right pleural effusion (s/p pleurodesis June 2017)  --ICM (BP has been a limiter to GDMT titration)  -Thought to have permanent AFib (verses atrial standstill) >> at gen change in 2004 A lead capped/abandoned with permanent AFib) has had subsequent EKG with SR  He last saw Dr.Croitoru 07/17/23, discussed gen change, options with plans to keep single chamber device  GEN change 09/04/23 pressure dressing placed at time of procedure, INR was 1.8 day of procedure  Advised to remove 09/05/23  09/17/23 wound check visit, noted a hematoma, otherwise incision was stable/healed.  Pressure dressing placed, INR was 2.1 > advised to take 1/2 usual dose of his warfarin that night then take as usual with plans to come back 09/19/23 for pressure dressing removal and evaluation for his site  Today's visit is scheduled as a wound check pressure dressing removal visit  ROS:   Tape is quite bothersome/irritating, otherwise no concerns He has not seen any visible bleeding from the site    Device information UNKNOWN date of original implant  Abbott PPM Gen change 2004 (A lead capped and abandoned this procedure w/permanent Afib) > gen change 01/31/2012 single chamber device and 09/04/23 single chamber device ALSO has an abandoned (cut) RV lead by CXR   Studies Reviewed: Marland Kitchen    EKG not done today  DEVICE interrogation not interrogated given stable findings 09/17/23   PET scan 06/13/2023   Severe fixed basal to apical inferior perfusion defect with hypokinesis in this region, consistent with  infarct.  No ischemia.  No high risk findings such as TID or drop in EF with stress (EF 36% at rest, 40% at stress), and myocardial blood flow reserve is normal.  Overall, intermediate risk study due to systolic dysfunction.   EKG not recorded during study   LV perfusion is abnormal. There is no evidence of ischemia. There is evidence of infarction. Defect 1: There is a medium defect with severe reduction in uptake present in the apical to basal inferior location(s) that is fixed. There is abnormal wall motion in the defect area. Consistent with infarction. The defect is consistent with abnormal perfusion in the RCA territory.   Rest left ventricular function is abnormal. Rest EF: 36%. Stress left ventricular function is abnormal. Stress EF: 40%. End diastolic cavity size is normal. End systolic cavity size is normal.   Myocardial blood flow was computed to be 0.8ml/g/min at rest and 1.64ml/g/min at stress. Global myocardial blood flow reserve was 2.67 and was normal.   Coronary calcium was present on the attenuation correction CT images. Moderate coronary calcifications were present. Coronary calcifications were present in the left anterior descending artery, left circumflex artery and right coronary artery distribution(s).   Findings are consistent with infarction. The study is intermediate risk.   Echocardiogram 04/03/2023:  1. Left ventricular ejection fraction, by estimation, is 30 to 35%. The  left ventricle has moderately decreased function. The left ventricle  demonstrates regional wall motion abnormalities (see scoring  diagram/findings for description). There is mild  concentric left ventricular hypertrophy. Left ventricular diastolic  function could  not be evaluated. There is severe hypokinesis of the left  ventricular, basal-mid inferoseptal wall. There is moderate hypokinesis of  the left ventricular, basal-mid  inferior wall.   2. Right ventricular systolic function is moderately  reduced. The right  ventricular size is mildly enlarged. There is normal pulmonary artery  systolic pressure.   3. Left atrial size was moderately dilated.   4. The mitral valve is normal in structure. Trivial mitral valve  regurgitation. No evidence of mitral stenosis.   5. The aortic valve is tricuspid. There is mild calcification of the  aortic valve. There is mild thickening of the aortic valve. Aortic valve  regurgitation is mild. Aortic valve sclerosis is present, with no evidence  of aortic valve stenosis.   6. Aortic dilatation noted. There is borderline dilatation of the  ascending aorta, measuring 38 mm.   7. The inferior vena cava is normal in size with greater than 50%  respiratory variability, suggesting right atrial pressure of 3 mmHg.   Comparison(s): Changes from prior study are noted. LV with global  hypokinesis as well as focal wall motion abnormalities in the inferior and  inferoseptal walls. EF 30-35%.      Risk Assessment/Calculations:    Physical Exam:   VS:  There were no vitals taken for this visit.   Wt Readings from Last 3 Encounters:  09/04/23 203 lb (92.1 kg)  08/12/23 206 lb (93.4 kg)  07/17/23 208 lb 9.6 oz (94.6 kg)     PPM site: pressure dressing is removed without difficulty, pt tolerated well Skin edges are well approximated + ecchymosis around the pocket Small amount of fluctuation/fluid, no firm hematoma remains No heat, no drainage, bleeding nontender  ASSESSMENT AND PLAN: .    PPM  Pocket hematoma s/p PPM generator change 09/04/23 No firm pocket hematoma appreciated Very small fluid/fluctuation remains, soft, non-tender INR today is 1.9 (having taken a single 1/2 usual dose the other day), instructed to continue his home dose Coumadin clinic as scheduled next week I'll see him back in about 10days to ensure stability given is warfarin        Dispo: as above, sooner if needed  Signed, Sheilah Pigeon, PA-C

## 2023-09-19 ENCOUNTER — Encounter: Payer: Self-pay | Admitting: Physician Assistant

## 2023-09-19 ENCOUNTER — Ambulatory Visit (INDEPENDENT_AMBULATORY_CARE_PROVIDER_SITE_OTHER): Payer: PPO | Admitting: Pharmacist

## 2023-09-19 ENCOUNTER — Ambulatory Visit: Payer: PPO | Attending: Physician Assistant | Admitting: Physician Assistant

## 2023-09-19 VITALS — BP 140/72 | HR 74 | Wt 208.0 lb

## 2023-09-19 DIAGNOSIS — Z7901 Long term (current) use of anticoagulants: Secondary | ICD-10-CM

## 2023-09-19 DIAGNOSIS — T82837D Hemorrhage of cardiac prosthetic devices, implants and grafts, subsequent encounter: Secondary | ICD-10-CM

## 2023-09-19 DIAGNOSIS — I482 Chronic atrial fibrillation, unspecified: Secondary | ICD-10-CM | POA: Diagnosis not present

## 2023-09-19 LAB — POCT INR: INR: 1.9 — AB (ref 2.0–3.0)

## 2023-09-19 NOTE — Patient Instructions (Signed)
Medication Instructions:   Your physician recommends that you continue on your current medications as directed. Please refer to the Current Medication list given to you today.    *If you need a refill on your cardiac medications before your next appointment, please call your pharmacy*   Lab Work: [place]   If you have labs (blood work) drawn today and your tests are completely normal, you will receive your results only by: [place] (if you have MyChart) OR A paper copy in the mail If you have any lab test that is abnormal or we need to change your treatment, we will call you to review the results.   Testing/Procedures: NONE ORDERED  TODAY     Follow-Up: At [facility], you and your health needs are our priority.  As part of our continuing mission to provide you with exceptional heart care, we have created designated Provider Care Teams.  These Care Teams include your primary Cardiologist (physician) and Advanced Practice Providers (APPs -  Physician Assistants and Nurse Practitioners) who all work together to provide you with the care you need, when you need it.  We recommend signing up for the patient portal called "MyChart".  Sign up information is provided on this After Visit Summary.  MyChart is used to connect with patients for Virtual Visits (Telemedicine).  Patients are able to view lab/test results, encounter notes, upcoming appointments, etc.  Non-urgent messages can be sent to your provider as well.   To learn more about what you can do with MyChart, go to [email or web address].    Your next appointment:   2 week(s)  Provider:   Tommye Standard, PA-C    Other Instructions

## 2023-09-19 NOTE — Patient Instructions (Signed)
Continue taking warfarin 1.5 tablets every day. Patient seeing Francis Dowse, PA-C for  hematoma pacemaker pocket. Keeping warfarin dose same despite subtherapeutic INR  Repeat INR on 09/24/2023.  Anticoagulation Clinic [phone] or [phone]

## 2023-09-22 ENCOUNTER — Telehealth: Payer: Self-pay | Admitting: Physician Assistant

## 2023-09-22 ENCOUNTER — Ambulatory Visit: Payer: PPO | Attending: Cardiovascular Disease

## 2023-09-22 DIAGNOSIS — I442 Atrioventricular block, complete: Secondary | ICD-10-CM

## 2023-09-22 NOTE — Telephone Encounter (Signed)
Pt states he had surgery a week ago and his incision has started to bleed and he needs to speak with a nurse. Please advise

## 2023-09-22 NOTE — Telephone Encounter (Signed)
The pt states he is bleeding again. I told him that the nurse will give him a call back in a few minutes.

## 2023-09-22 NOTE — Patient Instructions (Addendum)
   After Your Pacemaker   Monitor your pacemaker site for INCREASED redness, swelling, and drainage. Call the device clinic at [phone] if you experience these symptoms or fever/chills. DO NOT take your Coumadin until directed further. It will be discuss at next office visit 09/29/2023.

## 2023-09-22 NOTE — Telephone Encounter (Signed)
Spoke w/ patient. Says he's been pretty much continuously bleeding from the corner since last night. Advised to hold pressure and DC apt made for 1P today. Dr. Royann Shivers made aware.

## 2023-09-22 NOTE — Progress Notes (Signed)
Pt seen in device clinic by myself and Dr. Ladona Ridgel for bleeding at incision site/firm hematoma. Dr. Ladona Ridgel advised pt to HOLD coumadin until next Monday when pt sees Renee, [place] at wound check who will then discuss further. Pt voiced understanding.

## 2023-09-24 ENCOUNTER — Telehealth: Payer: Self-pay | Admitting: Physician Assistant

## 2023-09-24 ENCOUNTER — Other Ambulatory Visit: Payer: Self-pay | Admitting: Physician Assistant

## 2023-09-24 ENCOUNTER — Ambulatory Visit: Payer: PPO

## 2023-09-24 MED ORDER — CEPHALEXIN 500 MG PO CAPS: 500.0000 mg | ORAL_CAPSULE | Freq: Two times a day (BID) | ORAL | 0 refills | Status: DC

## 2023-09-24 NOTE — Telephone Encounter (Signed)
Outreach made to Pt.  Advised Renee had discussed antibiotic with pharmacist and was ok for Pt to take.  Confirmed Pt pharmacy and prescription sent.

## 2023-09-24 NOTE — Telephone Encounter (Signed)
Called to follow up on wound.  When he changes the gauze/bandage in the morning notes blood on the bandage, though not having to change it more then that and not bleeding through the gauze/or outside of the bandage, described as maybe a pin-hole, no obvious or open wound.   He reports as a child having been told of a rash with PCN, but thinks he has taken ampicillin without issue. He tolerated Ancef at the time of his gen change In d/w Dr. Royann Shivers given this is his 4 or 5th procedure, and having hematoma some bleeding will give prophylactic antibiotoc Keflex 500mg  BID for 1 week Pt aware of the plan, he confirms off warfarin, no change (enlarging or smaller) to the site swelling D/w pharmacist, Cephalexin has minimal if any affect on INR  Francis Dowse, PA-C

## 2023-09-24 NOTE — Telephone Encounter (Signed)
Call back received from Pt.    He is concerned about the incision site.  It continues to bleed from a small area along incision.  Advised Pt to come to device clinic this Friday at 9:00 am for wound recheck.  Pt in agreement.

## 2023-09-24 NOTE — Addendum Note (Signed)
Addended by: Roney Mans A on: 09/24/2023 01:40 PM   Modules accepted: Orders

## 2023-09-24 NOTE — Progress Notes (Signed)
Cardiology Office Note:  .   Date:  09/24/2023  ID:  Benjamin Mckay, DOB 1946-02-23, MRN [id number] PCP: Benjamin Bass, FNP  [facility] Providers Cardiologist:  Benjamin Fair, MD {  History of Present Illness: .   Benjamin Mckay is a 77 y.o. male w/PMHx of  --CHB w/PPM --VT (sustained),  --CAD (+MI hx) --PVD w/ RAS and carotid disease --Amiodarone > hyperthyroidism (better) --recurrent right pleural effusion (s/p pleurodesis June 2017)  --ICM (BP has been a limiter to GDMT titration)  -Thought to have permanent AFib (verses atrial standstill) >> at gen change in 2004 A lead capped/abandoned with permanent AFib) has had subsequent EKG with SR  He last saw Dr.Croitoru 07/17/23, discussed gen change, options with plans to keep single chamber device  GEN change 09/04/23 pressure dressing placed at time of procedure, INR was 1.8 day of procedure  Advised to remove 09/05/23  09/17/23 wound check visit, noted a hematoma, otherwise incision was stable/healed.  Pressure dressing placed, INR was 2.1 > advised to take 1/2 usual dose of his warfarin that night then take as usual with plans to come back 09/19/23 for pressure dressing removal and evaluation for his site  I saw him 09/19/23 for wound check pressure dressing removal visit, small amount of fluid/pocket without firm hematoma, skin edges well approximated, no bleeding, + ecchymosis INR was 1.9 Home dose warfarin continued Not felt to require any further pressure dressing  09/22/23: pt called with bleeding advised to come in and be seen Seen by device RN and Dr. Irish Lack to stop his warfarin and planned follow up  In d/w Dr. Royann Shivers, given some bleeding and number of device procedures, placed on Keflex 500mg  BID for a week   ROS:   He is on the keflex, not quite finished yet Has been a few days that he has not seen any bleeding Was at the very medial side/superior edge that he reports a small pinhole  that would bleed some Feels well otherwise Reports an infrequent random fleeting/momentary pain superior to the device   Device information UNKNOWN date of original implant  Abbott PPM Gen change2004 (A lead capped and abandoned this procedure w/permanent Afib) >  Subsequent gen changes 01/31/2012 single chamber device and 09/04/23 single chamber device ALSO has an abandoned (cut) RV lead by CXR   Studies Reviewed: Marland Kitchen    EKG not done today  DEVICE interrogation not interrogated given stable findings 09/17/23   PET scan 06/13/2023   Severe fixed basal to apical inferior perfusion defect with hypokinesis in this region, consistent with infarct.  No ischemia.  No high risk findings such as TID or drop in EF with stress (EF 36% at rest, 40% at stress), and myocardial blood flow reserve is normal.  Overall, intermediate risk study due to systolic dysfunction.   EKG not recorded during study   LV perfusion is abnormal. There is no evidence of ischemia. There is evidence of infarction. Defect 1: There is a medium defect with severe reduction in uptake present in the apical to basal inferior location(s) that is fixed. There is abnormal wall motion in the defect area. Consistent with infarction. The defect is consistent with abnormal perfusion in the RCA territory.   Rest left ventricular function is abnormal. Rest EF: 36%. Stress left ventricular function is abnormal. Stress EF: 40%. End diastolic cavity size is normal. End systolic cavity size is normal.   Myocardial blood flow was computed to be 0.83ml/g/min at rest  and 1.20ml/g/min at stress. Global myocardial blood flow reserve was 2.67 and was normal.   Coronary calcium was present on the attenuation correction CT images. Moderate coronary calcifications were present. Coronary calcifications were present in the left anterior descending artery, left circumflex artery and right coronary artery distribution(s).   Findings are consistent with  infarction. The study is intermediate risk.   Echocardiogram 04/03/2023:  1. Left ventricular ejection fraction, by estimation, is 30 to 35%. The  left ventricle has moderately decreased function. The left ventricle  demonstrates regional wall motion abnormalities (see scoring  diagram/findings for description). There is mild  concentric left ventricular hypertrophy. Left ventricular diastolic  function could not be evaluated. There is severe hypokinesis of the left  ventricular, basal-mid inferoseptal wall. There is moderate hypokinesis of  the left ventricular, basal-mid  inferior wall.   2. Right ventricular systolic function is moderately reduced. The right  ventricular size is mildly enlarged. There is normal pulmonary artery  systolic pressure.   3. Left atrial size was moderately dilated.   4. The mitral valve is normal in structure. Trivial mitral valve  regurgitation. No evidence of mitral stenosis.   5. The aortic valve is tricuspid. There is mild calcification of the  aortic valve. There is mild thickening of the aortic valve. Aortic valve  regurgitation is mild. Aortic valve sclerosis is present, with no evidence  of aortic valve stenosis.   6. Aortic dilatation noted. There is borderline dilatation of the  ascending aorta, measuring 38 mm.   7. The inferior vena cava is normal in size with greater than 50%  respiratory variability, suggesting right atrial pressure of 3 mmHg.   Comparison(s): Changes from prior study are noted. LV with global  hypokinesis as well as focal wall motion abnormalities in the inferior and  inferoseptal walls. EF 30-35%.      Risk Assessment/Calculations:    Physical Exam:   VS:  There were no vitals taken for this visit.   Wt Readings from Last 3 Encounters:  09/19/23 208 lb (94.3 kg)  09/04/23 203 lb (92.1 kg)  08/12/23 206 lb (93.4 kg)     PPM site:  Skin edges are well approximated Very small 2-13mm "blood blister" very  superficial very medial and superior aspect of the incision site No bleeding, no hematoma + ecchymosis is resolving/better then last visit I saw him No heat, no drainage nontender  ASSESSMENT AND PLAN: .    PPM  Pocket hematoma s/p PPM generator change 09/04/23 Pressure dressing removed 09/19/23 Reported bleeding 09/22/23  No bleeding reported now a few days Site looks good No bleeding No hematoma + ecchymosis though less then when I saw him last Advised to resume his usual warfarin dose/schedule and move his INR check to Thursday Will have him back in a week or so back on warfarin to re-look at the site        Dispo: as above, sooner if needed  Signed, Sheilah Pigeon, PA-C

## 2023-09-26 ENCOUNTER — Ambulatory Visit: Payer: PPO | Attending: Cardiovascular Disease

## 2023-09-26 DIAGNOSIS — I442 Atrioventricular block, complete: Secondary | ICD-10-CM

## 2023-09-26 DIAGNOSIS — T82837D Hemorrhage of cardiac prosthetic devices, implants and grafts, subsequent encounter: Secondary | ICD-10-CM

## 2023-09-26 NOTE — Patient Instructions (Signed)
Follow up as scheduled.  

## 2023-09-26 NOTE — Progress Notes (Signed)
Pt seen in device clinic for wound recheck.  Pt with hematoma s/p gen change with drainage.  Pt evaluated on 09/22/2023 by Dr. Ladona Ridgel and advised to hold warfarin until follow up on 09/29/2023.  Pt started on Keflex 500 mg BID on 09/24/2023 as extra layer of protection to prevent infection d/t incision site with small opening.  Upon evaluation today, incision site is now closed.  Hematoma assessed.  Area continues to be firm to touch, but color has improved and there does not appear to be any activity bleeding.  Advised Pt he can now leave bandage off incision site and encouraged Pt to shower and cleanse site with soap and water.  Pt will continue to monitor and follow up as scheduled.

## 2023-09-29 ENCOUNTER — Ambulatory Visit: Payer: PPO | Attending: Physician Assistant | Admitting: Physician Assistant

## 2023-09-29 VITALS — BP 120/72 | HR 80 | Ht 69.0 in | Wt 206.8 lb

## 2023-09-29 DIAGNOSIS — Z5189 Encounter for other specified aftercare: Secondary | ICD-10-CM | POA: Diagnosis not present

## 2023-09-29 NOTE — Patient Instructions (Addendum)
Medication Instructions:   START BACK TODAY:  TAKING USUAL WARFARIN( COUMADIN) DOSE   *If you need a refill on your cardiac medications before your next appointment, please call your pharmacy*   Lab Work: NONE ORDERED  TODAY    If you have labs (blood work) drawn today and your tests are completely normal, you will receive your results only by: MyChart Message (if you have MyChart) OR A paper copy in the mail If you have any lab test that is abnormal or we need to change your treatment, we will call you to review the results.   Testing/Procedures: NONE ORDERED  TODAY   Follow-Up: At [facility], you and your health needs are our priority.  As part of our continuing mission to provide you with exceptional heart care, we have created designated Provider Care Teams.  These Care Teams include your primary Cardiologist (physician) and Advanced Practice Providers (APPs -  Physician Assistants and Nurse Practitioners) who all work together to provide you with the care you need, when you need it.  We recommend signing up for the patient portal called "MyChart".  Sign up information is provided on this After Visit Summary.  MyChart is used to connect with patients for Virtual Visits (Telemedicine).  Patients are able to view lab/test results, encounter notes, upcoming appointments, etc.  Non-urgent messages can be sent to your provider as well.   To learn more about what you can do with MyChart, go to [email or web address].    Your next appointment:  IN 7-10 DAYS  WITH DEVICE CLINIC  FOR WOUND CHECK   MOVE COUMADIN APPT TO THIS THURSDAY    AS SCHEDULED WITH    Provider:   You may see Dr. Jomarie Longs or one of the following Advanced Practice Providers on your designated Care Team:   Francis Dowse, PA-C [facility] [address] [place], [place] Sherie Don, NP Canary Brim, NP    Other Instructions

## 2023-09-30 ENCOUNTER — Ambulatory Visit: Payer: PPO

## 2023-10-02 ENCOUNTER — Ambulatory Visit: Payer: PPO | Attending: Internal Medicine | Admitting: *Deleted

## 2023-10-02 DIAGNOSIS — Z7901 Long term (current) use of anticoagulants: Secondary | ICD-10-CM | POA: Diagnosis not present

## 2023-10-02 DIAGNOSIS — I482 Chronic atrial fibrillation, unspecified: Secondary | ICD-10-CM

## 2023-10-02 LAB — POCT INR: INR: 1.1 — AB (ref 2.0–3.0)

## 2023-10-02 NOTE — Patient Instructions (Signed)
Description   Since resuming warfarin dose on Monday then continue taking warfarin 1.5 tablets every day. Patient seeing Pacer Dept hematoma pacemaker pocket. Keeping warfarin dose same despite subtherapeutic INR. Repeat INR Monday.   Anticoagulation Clinic [phone] or [phone]

## 2023-10-06 ENCOUNTER — Telehealth: Payer: Self-pay

## 2023-10-06 ENCOUNTER — Ambulatory Visit: Payer: PPO | Attending: Internal Medicine

## 2023-10-06 ENCOUNTER — Ambulatory Visit (INDEPENDENT_AMBULATORY_CARE_PROVIDER_SITE_OTHER): Payer: Self-pay | Admitting: Pharmacist

## 2023-10-06 DIAGNOSIS — I482 Chronic atrial fibrillation, unspecified: Secondary | ICD-10-CM

## 2023-10-06 DIAGNOSIS — I442 Atrioventricular block, complete: Secondary | ICD-10-CM

## 2023-10-06 DIAGNOSIS — Z7901 Long term (current) use of anticoagulants: Secondary | ICD-10-CM

## 2023-10-06 DIAGNOSIS — Z5189 Encounter for other specified aftercare: Secondary | ICD-10-CM

## 2023-10-06 LAB — POCT INR: INR: 1.5 — AB (ref 2.0–3.0)

## 2023-10-06 NOTE — Telephone Encounter (Signed)
Thank you for the follow-up.  I suspect that this is atrial flutter with 2: 1 AV conduction.  He has done this before.  The episodes are relatively brief and I think we will just keep on monitoring for the time being.  No change in medications

## 2023-10-06 NOTE — Progress Notes (Signed)
Pt seen for wound recheck.  Hematoma remains stable.  No drainage from site.  Hematoma continues to be firm to the touch.  Pt complains of slight discomfort at top of pocket.  No new bleeding noted.  Will reevaluate in one week.  Pt advised to call office if new bleeding, swelling or drainage noted.

## 2023-10-06 NOTE — Telephone Encounter (Signed)
Alert received:  Pt with increased episodes of HVR.  Appear to be atrially driven.  Will forward to Dr. Salena Saner for advisement.

## 2023-10-06 NOTE — Telephone Encounter (Signed)
Returned call to Pt.  Advised at this time-no med changes.    Confirmed appt time 10/14/2023 at 10:00 am.

## 2023-10-06 NOTE — Patient Instructions (Signed)
Follow up 10/14/2023 for INR and wound recheck.

## 2023-10-09 ENCOUNTER — Telehealth: Payer: Self-pay

## 2023-10-09 NOTE — Telephone Encounter (Signed)
error 

## 2023-10-14 ENCOUNTER — Ambulatory Visit (INDEPENDENT_AMBULATORY_CARE_PROVIDER_SITE_OTHER): Payer: PPO | Admitting: Pharmacist

## 2023-10-14 ENCOUNTER — Ambulatory Visit: Payer: PPO | Attending: Cardiology

## 2023-10-14 DIAGNOSIS — I482 Chronic atrial fibrillation, unspecified: Secondary | ICD-10-CM | POA: Diagnosis not present

## 2023-10-14 DIAGNOSIS — Z7901 Long term (current) use of anticoagulants: Secondary | ICD-10-CM

## 2023-10-14 DIAGNOSIS — I442 Atrioventricular block, complete: Secondary | ICD-10-CM

## 2023-10-14 DIAGNOSIS — Z5189 Encounter for other specified aftercare: Secondary | ICD-10-CM

## 2023-10-14 DIAGNOSIS — T82837D Hemorrhage of cardiac prosthetic devices, implants and grafts, subsequent encounter: Secondary | ICD-10-CM

## 2023-10-14 LAB — POCT INR: INR: 1.7 — AB (ref 2.0–3.0)

## 2023-10-14 NOTE — Progress Notes (Signed)
 Pt seen for wound recheck post warfarin restart.  Pacemaker hematoma continues to improve.  Swelling appears to have decreased in size.  No active bleeding noted.  Pt advised to call device clinic if site does not continue to improve.  He will follow up with Dr. Francyne and coumadin  clinic as scheduled.

## 2023-10-14 NOTE — Patient Instructions (Signed)
Please contact device clinic at [phone] if you have any concerns regarding your pacemaker site.  Follow up with Dr. Royann Shivers and coumadin clinic as advised.

## 2023-10-14 NOTE — Patient Instructions (Signed)
 Take 2 tablets today then continue taking warfarin 1.5 tablets every day.  Repeat INR Thursday 1/9   Anticoagulation Clinic [phone] or [phone]

## 2023-10-23 ENCOUNTER — Other Ambulatory Visit: Payer: Self-pay | Admitting: *Deleted

## 2023-10-23 ENCOUNTER — Ambulatory Visit: Payer: PPO | Attending: Cardiology | Admitting: *Deleted

## 2023-10-23 DIAGNOSIS — Z7901 Long term (current) use of anticoagulants: Secondary | ICD-10-CM

## 2023-10-23 DIAGNOSIS — I482 Chronic atrial fibrillation, unspecified: Secondary | ICD-10-CM

## 2023-10-23 LAB — POCT INR: INR: 1.6 — AB (ref 2.0–3.0)

## 2023-10-23 MED ORDER — WARFARIN SODIUM 2.5 MG PO TABS: ORAL_TABLET | ORAL | 1 refills | Status: DC

## 2023-10-23 NOTE — Telephone Encounter (Signed)
 Spoke with pharmacy to disregard 100 tablet refill of warfarin since not a 90 day supply for current dose. They will be on the lookout for new rx for warfarin. Last INR today 10/23/23 Last OV 09/29/23 Afib

## 2023-10-23 NOTE — Patient Instructions (Addendum)
 Description   Take 2 tablets today then continue taking warfarin 1.5 tablets every day.  Repeat INR in 1 week. Anticoagulation Clinic [phone] or [phone]

## 2023-10-30 ENCOUNTER — Ambulatory Visit: Payer: PPO | Attending: Internal Medicine

## 2023-10-30 DIAGNOSIS — I482 Chronic atrial fibrillation, unspecified: Secondary | ICD-10-CM | POA: Diagnosis not present

## 2023-10-30 LAB — POCT INR: INR: 1.5 — AB (ref 2.0–3.0)

## 2023-10-30 NOTE — Patient Instructions (Signed)
Description   Take 2 tablets today then START taking warfarin 1.5 tablets every day EXCEPT 2 tablets Mondays and Fridays.   Repeat INR in 1 week.  Anticoagulation Clinic [phone]

## 2023-11-10 ENCOUNTER — Ambulatory Visit: Payer: PPO | Attending: Cardiovascular Disease

## 2023-11-10 DIAGNOSIS — I482 Chronic atrial fibrillation, unspecified: Secondary | ICD-10-CM | POA: Diagnosis not present

## 2023-11-10 DIAGNOSIS — Z7901 Long term (current) use of anticoagulants: Secondary | ICD-10-CM

## 2023-11-10 LAB — POCT INR: INR: 1.7 — AB (ref 2.0–3.0)

## 2023-11-10 MED ORDER — WARFARIN SODIUM 2.5 MG PO TABS: ORAL_TABLET | ORAL | 0 refills | Status: DC

## 2023-11-10 NOTE — Patient Instructions (Signed)
Description   Take 2.5 tablets today and then START taking 2 tablets daily except 1.5 tablets on Tuesday, Thursday, and Saturday. Repeat INR in 2 weeks.  Anticoagulation Clinic [phone]

## 2023-11-13 DIAGNOSIS — R3121 Asymptomatic microscopic hematuria: Secondary | ICD-10-CM | POA: Diagnosis not present

## 2023-11-13 DIAGNOSIS — R350 Frequency of micturition: Secondary | ICD-10-CM | POA: Diagnosis not present

## 2023-11-13 DIAGNOSIS — N401 Enlarged prostate with lower urinary tract symptoms: Secondary | ICD-10-CM | POA: Diagnosis not present

## 2023-11-24 ENCOUNTER — Ambulatory Visit: Payer: PPO | Attending: Cardiovascular Disease

## 2023-11-24 DIAGNOSIS — Z5181 Encounter for therapeutic drug level monitoring: Secondary | ICD-10-CM | POA: Diagnosis not present

## 2023-11-24 DIAGNOSIS — I482 Chronic atrial fibrillation, unspecified: Secondary | ICD-10-CM | POA: Diagnosis not present

## 2023-11-24 LAB — POCT INR: INR: 2.2 (ref 2.0–3.0)

## 2023-11-24 NOTE — Patient Instructions (Signed)
 Description   Take 2.5 tablets today and then continue taking 2 tablets daily except 1.5 tablets on Tuesday, Thursday, and Saturday. Repeat INR in 3 weeks.  Anticoagulation Clinic [phone]

## 2023-12-04 ENCOUNTER — Ambulatory Visit (INDEPENDENT_AMBULATORY_CARE_PROVIDER_SITE_OTHER): Payer: PPO

## 2023-12-04 DIAGNOSIS — I442 Atrioventricular block, complete: Secondary | ICD-10-CM | POA: Diagnosis not present

## 2023-12-05 LAB — CUP PACEART REMOTE DEVICE CHECK
Battery Remaining Longevity: 116 mo
Battery Remaining Percentage: 95.5 %
Battery Voltage: 3.01 V
Brady Statistic RV Percent Paced: 48 %
Date Time Interrogation Session: 20250220020012
Lead Channel Impedance Value: 390 Ohm
Lead Channel Pacing Threshold Amplitude: 0.75 V
Lead Channel Pacing Threshold Pulse Width: 0.5 ms
Lead Channel Sensing Intrinsic Amplitude: 9 mV
Lead Channel Setting Pacing Amplitude: 2.5 V
Lead Channel Setting Pacing Pulse Width: 0.5 ms
Lead Channel Setting Sensing Sensitivity: 2.5 mV
Pulse Gen Model: 1272
Pulse Gen Serial Number: 8208618

## 2023-12-15 ENCOUNTER — Ambulatory Visit: Payer: PPO | Attending: Cardiovascular Disease | Admitting: *Deleted

## 2023-12-15 DIAGNOSIS — Z7901 Long term (current) use of anticoagulants: Secondary | ICD-10-CM

## 2023-12-15 DIAGNOSIS — I482 Chronic atrial fibrillation, unspecified: Secondary | ICD-10-CM | POA: Diagnosis not present

## 2023-12-15 LAB — POCT INR: INR: 2.8 (ref 2.0–3.0)

## 2023-12-15 NOTE — Patient Instructions (Signed)
 Description   Continue taking 2 tablets daily except 1.5 tablets on Tuesday, Thursday, and Saturday. Repeat INR in 2 weeks with MD appt.  Anticoagulation Clinic [phone]

## 2023-12-21 ENCOUNTER — Encounter: Payer: Self-pay | Admitting: Cardiovascular Disease

## 2024-01-01 ENCOUNTER — Ambulatory Visit: Payer: PPO | Attending: Cardiovascular Disease | Admitting: Cardiovascular Disease

## 2024-01-01 ENCOUNTER — Encounter: Payer: Self-pay | Admitting: Cardiovascular Disease

## 2024-01-01 ENCOUNTER — Ambulatory Visit (INDEPENDENT_AMBULATORY_CARE_PROVIDER_SITE_OTHER)

## 2024-01-01 VITALS — BP 124/62 | HR 73 | Ht 69.0 in | Wt 208.2 lb

## 2024-01-01 DIAGNOSIS — I482 Chronic atrial fibrillation, unspecified: Secondary | ICD-10-CM | POA: Diagnosis not present

## 2024-01-01 DIAGNOSIS — I10 Essential (primary) hypertension: Secondary | ICD-10-CM | POA: Diagnosis not present

## 2024-01-01 DIAGNOSIS — I4811 Longstanding persistent atrial fibrillation: Secondary | ICD-10-CM | POA: Diagnosis not present

## 2024-01-01 DIAGNOSIS — I701 Atherosclerosis of renal artery: Secondary | ICD-10-CM | POA: Diagnosis not present

## 2024-01-01 DIAGNOSIS — I6523 Occlusion and stenosis of bilateral carotid arteries: Secondary | ICD-10-CM

## 2024-01-01 DIAGNOSIS — E7800 Pure hypercholesterolemia, unspecified: Secondary | ICD-10-CM | POA: Diagnosis not present

## 2024-01-01 DIAGNOSIS — I454 Nonspecific intraventricular block: Secondary | ICD-10-CM

## 2024-01-01 DIAGNOSIS — I2510 Atherosclerotic heart disease of native coronary artery without angina pectoris: Secondary | ICD-10-CM | POA: Diagnosis not present

## 2024-01-01 DIAGNOSIS — Z950 Presence of cardiac pacemaker: Secondary | ICD-10-CM | POA: Diagnosis not present

## 2024-01-01 DIAGNOSIS — J90 Pleural effusion, not elsewhere classified: Secondary | ICD-10-CM

## 2024-01-01 DIAGNOSIS — D6869 Other thrombophilia: Secondary | ICD-10-CM | POA: Diagnosis not present

## 2024-01-01 DIAGNOSIS — I442 Atrioventricular block, complete: Secondary | ICD-10-CM

## 2024-01-01 DIAGNOSIS — I5042 Chronic combined systolic (congestive) and diastolic (congestive) heart failure: Secondary | ICD-10-CM | POA: Diagnosis not present

## 2024-01-01 LAB — POCT INR: INR: 2.8 (ref 2.0–3.0)

## 2024-01-01 NOTE — Patient Instructions (Addendum)
 Medication Instructions:  Your physician recommends that you continue on your current medications as directed. Please refer to the Current Medication list given to you today.   Follow-Up: At [facility], you and your health needs are our priority.  As part of our continuing mission to provide you with exceptional heart care, we have created designated Provider Care Teams.  These Care Teams include your primary Cardiologist (physician) and Advanced Practice Providers (APPs -  Physician Assistants and Nurse Practitioners) who all work together to provide you with the care you need, when you need it.  We recommend signing up for the patient portal called "MyChart".  Sign up information is provided on this After Visit Summary.  MyChart is used to connect with patients for Virtual Visits (Telemedicine).  Patients are able to view lab/test results, encounter notes, upcoming appointments, etc.  Non-urgent messages can be sent to your provider as well.   To learn more about what you can do with MyChart, go to [email or web address].    Your next appointment:   1 year(s)  Provider:   Thurmon Fair, MD     Other Instructions   1st Floor: - Lobby - Registration  - Pharmacy  - Lab - Cafe  2nd Floor: - PV Lab - Diagnostic Testing (echo, CT, nuclear med)  3rd Floor: - Vacant  4th Floor: - TCTS (cardiothoracic surgery) - AFib Clinic - Structural Heart Clinic - Vascular Surgery  - Vascular Ultrasound  5th Floor: - HeartCare Cardiology (general and EP) - Clinical Pharmacy for coumadin, hypertension, lipid, weight-loss medications, and med management appointments    Valet parking services will be available as well.

## 2024-01-01 NOTE — Patient Instructions (Signed)
 Description   Continue taking 2 tablets daily except 1.5 tablets on Tuesday, Thursday, and Saturday. Repeat INR in 4 weeks.  Anticoagulation Clinic [phone]

## 2024-01-03 NOTE — Progress Notes (Signed)
 Patient ID: Benjamin Mckay, male   DOB: [birth year], 78 y.o.   MRN: [id number]     Cardiology Office Note    Date:  01/03/2024   ID:  Benjamin Mckay, DOB 1945/10/20, MRN [id number]  PCP:  Benjamin Bass, FNP  Cardiologist:   Benjamin Fair, MD   Chief complaint: AFib, Pacemaker check   History of Present Illness:  Benjamin Mckay is a 78 y.o. male with complex cardiac history: intermittent complete heart block-pacemaker dependent, chronic atrial fibrillation versus atrial standstill, history of sustained ventricular tachycardia, coronary artery disease with history of inferior wall scar due to myocardial infarction, renal and carotid artery stenosis), amiodarone induced hyperthyroidism (now quiescent) and a recurrent right pleural effusion (s/p pleurodesis June 2017).  Sometimes his native rhythm appears very regular (See ECG from 12/19/2017 - AFlutter with 2:1 AV block, 12/11/2016 - accelerated junctional  or ventricular rhythm, 01/14/2022 -probably sinus rhythm).  He has a single-chamber pacemaker that was implanted many years ago, but surprisingly has had documented possible sinus rhythm (versus slow atrial flutter) on at least 1 occasion in the last few years (see ECG 01/14/2022).  He does have an atrial lead in place which was capped at a generator change out in 2004.  He underwent a second generator change in November 2024 and now has an Abbott Assurity MRI conditional device (but the system is not MRI compatible due to the abandoned atrial lead).  After his last generator change she had a lengthy problem with a pocket hematoma that delayed healing, but it is finally settled down.  The site now appears very healthy with complete healing of the surgical scar and no evidence of residual swelling, tenderness or any sign of infection.  Estimated generator longevity is 10 years.  He has roughly 47% ventricular pacing.  Although he frequently has native AV conduction, there are times when  he has complete heart block without escape rhythm and therefore is considered pacemaker dependent.  He has occasional high ventricular rates with no change in the ventricular electrogram, that likely represent atrial fibrillation with rapid ventricular response, but at times the rhythm is regular with a cycle length of 350 ms I suspect these represent atrial flutter with 2: 1 AV conduction, less likely paroxysmal atrial tachycardia with 1: 1 AV conduction.  He remains physically active.  Enjoys playing golf several times a week.  He has not had issues with shortness of breath or chest pain at rest or with activity.  He denies palpitations or syncope.  He has not had lower extremity edema, orthopnea or PND.  He is a little slow getting up and down stairs due to knee problems.     Has not required any recent extra doses of loop diuretic.  He has mildly depressed left ventricular systolic function and so we had him undergo a PET scan.  The ejection fraction was calculated at 40% and the study confirmed the known old scar of inferior infarction without showing any other areas of ischemia.  He had normal global myocardial flow reserve suggesting good prognosis.    Very surprisingly, at a previous appointment in April 2023 his electrocardiogram showed a period of clear-cut normal sinus rhythm.  He had been felt to have atrial standstill and/or atrial fibrillation for years now.  He has a single-chamber pacemaker for this reason.  This probably explains why he sometimes has episodes of regular rhythm without change in QRS morphology recorded by his device.   Past Medical History:  Diagnosis Date   Atrial fibrillation (HCC)    Cardiomyopathy, ischemic 11/07/2013   Cataract    bil cataracts removed   CHB (complete heart block) (HCC) 11/06/2013   Chronic combined systolic and diastolic CHF (congestive heart failure) (HCC)    Clotting disorder (HCC)    TIA , cva - PT ON COUMADINE   Colon polyps    Colon  polyps    Congenital heart block    Constipation    CVA (cerebral infarction)    Diverticulosis    Esophageal stricture    Family history of adverse reaction to anesthesia    Heart murmur    Hyperlipidemia    Hypertension    Hyperthyroidism 11/16/2015   Lymphoma (HCC) 1998   of colon    Presence of permanent cardiac pacemaker    [employer]  pt.states he is totally dependent on pacemaker   Shortness of breath dyspnea    Pulmonary Effusion   Stroke (HCC)    TIA, cva   Umbilical hernia    Urinary frequency     Past Surgical History:  Procedure Laterality Date   APPENDECTOMY  1953   CARDIAC CATHETERIZATION  01/06/2003   Recommend medical therapy   CARDIOVASCULAR STRESS TEST  07/16/2012   Mild-moderate perfusion defect seen in Basal inferior, Mid inferior, and Apica lateral consistent with infarct/scar. No scintigraphic evidence for inducible myocardial ischemia. No ECG changes. EKG negative for ischemia.   CAROTID DOPPLER  11/04/2012   Proximal Rt ICA 50-99% diameter reduction; Lft Bulb demonstrated mild amount homogeneous plaque-not hemodynamically significant; Lft ICA-normal patency.   CHEST TUBE INSERTION Right 03/15/2016   Procedure: INSERTION PLEURAL DRAINAGE CATHETER;  Surgeon: Benjamin Perna, MD;  Location: [facility] OR;  Service: Thoracic;  Laterality: Right;   COLECTOMY     for lymphoma   COLONOSCOPY     neck fusion     PACEMAKER GENERATOR CHANGE  01/31/2012   [employer] Accent DR RF model [id number] serial [phone]   PACEMAKER PLACEMENT     replaced 3 x   PERMANENT PACEMAKER GENERATOR CHANGE N/A 01/31/2012   Procedure: PERMANENT PACEMAKER GENERATOR CHANGE;  Surgeon: Benjamin Fair, MD;  Location: [facility] CATH LAB;    PLEURADESIS N/A 04/02/2016   Procedure: Benjamin Mckay;  Surgeon: Benjamin Perna, MD;  Location: [facility] OR;  Service: Thoracic;  Laterality: N/A;   PPM GENERATOR CHANGEOUT N/A 09/04/2023   Procedure: PPM GENERATOR CHANGEOUT;  Surgeon: Benjamin Fair, MD;  Location: [facility] INVASIVE CV  LAB;  Service: Cardiovascular;  Laterality: N/A;   REMOVAL OF PLEURAL DRAINAGE CATHETER Right 07/26/2016   Procedure: REMOVAL OF PLEURAL DRAINAGE CATHETER;  Surgeon: Benjamin Perna, MD;  Location: [facility] OR;  Service: Thoracic;  Laterality: Right;   TONSILLECTOMY     TRANSTHORACIC ECHOCARDIOGRAM  11/04/2012   EF 35-40%, systolic function moderately reduced, mild regurg of the aortic and mitral valves.   VIDEO ASSISTED THORACOSCOPY Right 04/02/2016   Procedure: Right VIDEO ASSISTED THORACOSCOPY with Biopsies and drainage pleural effusion;  Surgeon: Benjamin Perna, MD;  Location: [facility] OR;  Service: Thoracic;  Laterality: Right;    Current Medications: Outpatient Medications Prior to Visit  Medication Sig Dispense Refill   acetaminophen (TYLENOL) 500 MG tablet Take 1,000 mg by mouth Mckay 6 (six) hours as needed for moderate pain (pain score 4-6).     empagliflozin (JARDIANCE) 10 MG TABS tablet Take 1 tablet (10 mg total) by mouth daily before breakfast. 30 tablet 11   Evolocumab (REPATHA SURECLICK) 140 MG/ML SOAJ  INJECT 140 MG (CONTENTS OF 1 INJECTOR) INTO THE SKIN Mckay 14 (FOURTEEN) DAYS 6 mL 3   hydrocortisone cream 1 % Apply 1 Application topically 2 (two) times daily as needed for itching.     metoprolol succinate (TOPROL-XL) 25 MG 24 hr tablet TAKE 1 TABLET BY MOUTH TWICE A DAY 180 tablet 3   pravastatin (PRAVACHOL) 80 MG tablet TAKE 1 TABLET BY MOUTH Mckay DAY 90 tablet 3   sacubitril-valsartan (ENTRESTO) 24-26 MG Take 1 tablet by mouth 2 (two) times daily. 180 tablet 1   spironolactone (ALDACTONE) 25 MG tablet TAKE 1/2 TABLET (12.5 MG TOTAL) BY MOUTH Mckay OTHER DAY. 45 tablet 3   tamsulosin (FLOMAX) 0.4 MG CAPS capsule Take 0.4 mg by mouth at bedtime.     traZODone (DESYREL) 50 MG tablet TAKE 1/2 TO 1 TABLET BY MOUTH AT BEDTIME AS NEEDED FOR SLEEP 90 tablet 1   warfarin (COUMADIN) 2.5 MG tablet TAKE 1.5 TO 2 TABLETS BY MOUTH DAILY OR AS DIRECTED BY THE COUMADIN CLINIC 160 tablet 0    cephALEXin (KEFLEX) 500 MG capsule Take 1 capsule (500 mg total) by mouth 2 (two) times daily. 14 capsule 0   No facility-administered medications prior to visit.     Allergies:   Statins, Adhesive [tape], Latex, and Penicillins   Family History:  The patient's family history includes Diabetes in his maternal grandmother; Heart disease in his mother; Prostate cancer in his father; Stroke in his mother.     PHYSICAL EXAM:   VS:  BP 124/62 (BP Location: Left Arm, Patient Position: Sitting, Cuff Size: Normal)   Pulse 73   Ht 5\' 9"  (1.753 m)   Wt 208 lb 3.2 oz (94.4 kg)   SpO2 97%   BMI 30.75 kg/m      General: Alert, oriented x3, no distress, he is borderline obese but also appears pretty muscular and fit for age 60.  The left subclavian pacemaker site is well-healed without evidence of redness, fluctuance, tenderness or warmth. Head: no evidence of trauma, PERRL, EOMI, no exophtalmos or lid lag, no myxedema, no xanthelasma; normal ears, nose and oropharynx Neck: normal jugular venous pulsations and no hepatojugular reflux; brisk carotid pulses without delay and no carotid bruits Chest: Dullness to percussion in the right lung base following previous previous pleurodesis Cardiovascular: normal position and quality of the apical impulse, irregular rhythm, normal first and second heart sounds, no murmurs, rubs or gallops Abdomen: no tenderness or distention, no masses by palpation, no abnormal pulsatility or arterial bruits, normal bowel sounds, no hepatosplenomegaly Extremities: no clubbing, cyanosis or edema; 2+ radial, ulnar and brachial pulses bilaterally; 2+ right femoral, posterior tibial and dorsalis pedis pulses; 2+ left femoral, posterior tibial and dorsalis pedis pulses; no subclavian or femoral bruits Neurological: grossly nonfocal Psych: Normal mood and affect     Wt Readings from Last 3 Encounters:  01/01/24 208 lb 3.2 oz (94.4 kg)  09/29/23 206 lb 12.8 oz (93.8 kg)   09/19/23 208 lb (94.3 kg)      Studies/Labs Reviewed:  PET scan 07-09-23   Severe fixed basal to apical inferior perfusion defect with hypokinesis in this region, consistent with infarct.  No ischemia.  No high risk findings such as TID or drop in EF with stress (EF 36% at rest, 40% at stress), and myocardial blood flow reserve is normal.  Overall, intermediate risk study due to systolic dysfunction.   EKG not recorded during study   LV perfusion is abnormal. There is no  evidence of ischemia. There is evidence of infarction. Defect 1: There is a medium defect with severe reduction in uptake present in the apical to basal inferior location(s) that is fixed. There is abnormal wall motion in the defect area. Consistent with infarction. The defect is consistent with abnormal perfusion in the RCA territory.   Rest left ventricular function is abnormal. Rest EF: 36%. Stress left ventricular function is abnormal. Stress EF: 40%. End diastolic cavity size is normal. End systolic cavity size is normal.   Myocardial blood flow was computed to be 0.63ml/g/min at rest and 1.66ml/g/min at stress. Global myocardial blood flow reserve was 2.67 and was normal.   Coronary calcium was present on the attenuation correction CT images. Moderate coronary calcifications were present. Coronary calcifications were present in the left anterior descending artery, left circumflex artery and right coronary artery distribution(s).   Findings are consistent with infarction. The study is intermediate risk. Echocardiogram 04/03/2023:    1. Left ventricular ejection fraction, by estimation, is 30 to 35%. The  left ventricle has moderately decreased function. The left ventricle  demonstrates regional wall motion abnormalities (see scoring  diagram/findings for description). There is mild  concentric left ventricular hypertrophy. Left ventricular diastolic  function could not be evaluated. There is severe hypokinesis of the  left  ventricular, basal-mid inferoseptal wall. There is moderate hypokinesis of  the left ventricular, basal-mid  inferior wall.   2. Right ventricular systolic function is moderately reduced. The right  ventricular size is mildly enlarged. There is normal pulmonary artery  systolic pressure.   3. Left atrial size was moderately dilated.   4. The mitral valve is normal in structure. Trivial mitral valve  regurgitation. No evidence of mitral stenosis.   5. The aortic valve is tricuspid. There is mild calcification of the  aortic valve. There is mild thickening of the aortic valve. Aortic valve  regurgitation is mild. Aortic valve sclerosis is present, with no evidence  of aortic valve stenosis.   6. Aortic dilatation noted. There is borderline dilatation of the  ascending aorta, measuring 38 mm.   7. The inferior vena cava is normal in size with greater than 50%  respiratory variability, suggesting right atrial pressure of 3 mmHg.   Comparison(s): Changes from prior study are noted. LV with global  hypokinesis as well as focal wall motion abnormalities in the inferior and  inferoseptal walls. EF 30-35%.   EKG: Most recent tracing from 04/24/2023 personally reviewed shows 100% ventricular paced rhythm with background atrial fibrillation.    EKG Interpretation Date/Time:    Ventricular Rate:    PR Interval:    QRS Duration:    QT Interval:    QTC Calculation:   R Axis:      Text Interpretation:           Previous electrogram from 01/14/2022 shows a period of sinus rhythm followed by a ventricular paced beat and then possible atrial fibrillation, left bundle branch block with QRS duration 148 ms and left axis deviation, QTc normal at 416 ms.  Recent Labs: 09/01/2023: BUN 11; Creatinine, Ser 1.22; Hemoglobin 18.1; Platelets 188; Potassium 5.0; Sodium 139   Lipid Panel    Component Value Date/Time   CHOL 120 08/12/2023 1022   CHOL 102 02/23/2020 0909   TRIG 61.0 08/12/2023  1022   HDL 44.10 08/12/2023 1022   HDL 43 02/23/2020 0909   CHOLHDL 3 08/12/2023 1022   VLDL 12.2 08/12/2023 1022   LDLCALC 64 08/12/2023 1022  LDLCALC 46 02/23/2020 0909     ASSESSMENT:    1. Chronic combined systolic (congestive) and diastolic (congestive) heart failure (HCC)   2. Coronary artery disease involving native coronary artery of native heart without angina pectoris   3. CHB (complete heart block) (HCC)   4. IVCD (intraventricular conduction defect)   5. Longstanding persistent atrial fibrillation (HCC)   6. Essential hypertension   7. Pacemaker   8. Pleural effusion, right   9. Acquired thrombophilia (HCC)   10. Renal artery stenosis (HCC)   11. Bilateral carotid artery stenosis   12. Hypercholesterolemia        PLAN:  In order of problems listed above:  CHF: Clinically euvolemic without loop diuretic, NYHA functional class I.  Dry weight appears to be 2102 pounds, and he is exactly in fact range today.  Reduction in EF is largely related to previous inferior infarction but also significant pacing related LV dyssynchrony.   No longer required scheduled loop diuretics after we started him on Jardiance and Entresto and spironolactone.  Only on a low-dose of beta-blocker to limit the need for ventricular pacing. CAD: He does not have angina.  Has evidence of old inferior wall scar but no ischemia on a recent PET scan.  She prefers not to undergo invasive evaluations. CHB: Requires roughly 50% ventricular pacing and sometimes he appears to be pacemaker dependent.  But the complete heart block occurs only intermittently .  In the past he has been labeled as having congenital heart block with junctional escape rhythm, but we have clearly documented sinus rhythm with 1: 1 AV conduction, atrial flutter with variable AV block and atrial fibrillation with rapid ventricular response.  (Interestingly, his son also bears a diagnosis of congenital complete heart block.)  Although  in the past he has had periods of complete heart block, most often rhythm is atrial fibrillation, controlled rate or with slow ventricular response and ventricular pacing.  His ECG in April 2023 shows clear evidence of sinus rhythm with 1:1 AV conduction, although it was felt that he has permanent atrial fibrillation.  This explains why sometimes his device will record regular tachycardia, probably sinus tachycardia or atrial flutter with 2: 1 AV block.  His QRS is always very broad (native QRS 150 ms, paced QRS 200 ms). On one occasion years ago he had a near syncopal event and had regular wide complex rhythm, presumed to be VT.  Retrospectively one wonders whether that was a vasovagal event/hypotension (occurred outside in the heat at a golf tournament).   IVCD: On several occasions in the past I have offered upgrade to a CRT device, but he has always declined.  He is doing so well clinically that it is hard to push for a CRT upgrade. AFib: He is never aware of the arrhythmia and it appears to be consistently asymptomatic regardless of how long it persists.  He has long periods of persistent atrial fibrillation, so he received a single chamber pacemaker, but surprisingly has documented sinus rhythm as recently as April 2023.  The presence or absence of atrial fibrillation versus irregular rhythm appears to have no impact on his functional status or symptoms.  CHADSVasc 5 (age 89, CHF, CAD, HTN).  On appropriate anticoagulation.  No history of embolic events.  The current dose of beta-blocker seems to be maintaining a reasonable balance between excessive ventricular pacing which could cause worsening heart failure versus control of the episodes of tachyarrhythmia. HTN: Well-controlled on current medications. PPM: Single-chamber ventricular  device with normal function.  Although he has an MRI conditional generator, the abandoned atrial lead makes his system incompatible with MRI.  Also has an old passive fixation  ventricular lead that seems to have been cut.  Even if we implanted a dual-chamber device and reconnect his atrial lead, the system would not be MRI conditional.   Right lung abnormal exam: Previous pleurodesis. Anticoagulation: Almost always in therapeutic range on warfarin. Renal artery stenosis: Stable creatinine around 1.1-1.2. Carotid stenosis: No neurological complaints/asymptomatic.  He has declined to undergo ultrasound monitoring. HLP: At [employer] LDL less than 70.  He requires both pravastatin and Repatha to achieve this.  He did not tolerate more potent statins due to myopathy.    Medication Adjustments/Labs and Tests Ordered: Current medicines are reviewed at length with the patient today.  Concerns regarding medicines are outlined above.  Medication changes, Labs and Tests ordered today are listed in the Patient Instructions below. Patient Instructions  Medication Instructions:  Your physician recommends that you continue on your current medications as directed. Please refer to the Current Medication list given to you today.  Follow-Up: At [facility], you and your health needs are our priority.  As part of our continuing mission to provide you with exceptional heart care, we have created designated Provider Care Teams.  These Care Teams include your primary Cardiologist (physician) and Advanced Practice Providers (APPs -  Physician Assistants and Nurse Practitioners) who all work together to provide you with the care you need, when you need it.  We recommend signing up for the patient portal called "MyChart".  Sign up information is provided on this After Visit Summary.  MyChart is used to connect with patients for Virtual Visits (Telemedicine).  Patients are able to view lab/test results, encounter notes, upcoming appointments, etc.  Non-urgent messages can be sent to your provider as well.   To learn more about what you can do with MyChart, go to [email or web address].     Your next appointment:   1 year(s)  Provider:   Thurmon Fair, MD     Other Instructions   1st Floor: - Lobby - Registration  - Pharmacy  - Lab - Cafe  2nd Floor: - PV Lab - Diagnostic Testing (echo, CT, nuclear med)  3rd Floor: - Vacant  4th Floor: - TCTS (cardiothoracic surgery) - AFib Clinic - Structural Heart Clinic - Vascular Surgery  - Vascular Ultrasound  5th Floor: - HeartCare Cardiology (general and EP) - Clinical Pharmacy for coumadin, hypertension, lipid, weight-loss medications, and med management appointments    Valet parking services will be available as well.         Signed, Benjamin Fair, MD  01/03/2024 7:02 PM    [facility] [address] [place], [place], [place]  [address] Phone: [phone]; Fax: [phone]

## 2024-01-05 ENCOUNTER — Other Ambulatory Visit: Payer: Self-pay | Admitting: Cardiovascular Disease

## 2024-01-05 DIAGNOSIS — I5023 Acute on chronic systolic (congestive) heart failure: Secondary | ICD-10-CM

## 2024-01-07 NOTE — Progress Notes (Signed)
 Remote pacemaker transmission.

## 2024-01-07 NOTE — Addendum Note (Signed)
 Addended by: Elease Etienne A on: 01/07/2024 10:41 AM   Modules accepted: Orders

## 2024-01-14 DIAGNOSIS — L821 Other seborrheic keratosis: Secondary | ICD-10-CM | POA: Diagnosis not present

## 2024-01-14 DIAGNOSIS — L570 Actinic keratosis: Secondary | ICD-10-CM | POA: Diagnosis not present

## 2024-01-14 DIAGNOSIS — Z85828 Personal history of other malignant neoplasm of skin: Secondary | ICD-10-CM | POA: Diagnosis not present

## 2024-01-14 DIAGNOSIS — L814 Other melanin hyperpigmentation: Secondary | ICD-10-CM | POA: Diagnosis not present

## 2024-01-14 DIAGNOSIS — Z08 Encounter for follow-up examination after completed treatment for malignant neoplasm: Secondary | ICD-10-CM | POA: Diagnosis not present

## 2024-01-15 ENCOUNTER — Other Ambulatory Visit: Payer: Self-pay | Admitting: Family

## 2024-01-15 NOTE — Telephone Encounter (Signed)
 Existing refill.

## 2024-01-15 NOTE — Telephone Encounter (Signed)
 Copied from CRM [id number]. Topic: Clinical - Medication Refill >> Jan 15, 2024  2:27 PM Marica Otter wrote: Most Recent Primary Care Visit:  Provider: Ria Clock [facility]  Department: [facility]  Visit Type: PHYSICAL  Date: 08/12/2023  Medication: traZODone (DESYREL) 50 MG tablet  Has the patient contacted their pharmacy? Yes, pharmacy states waiting on provider authorization (Agent: If no, request that the patient contact the pharmacy for the refill. If patient does not wish to contact the pharmacy document the reason why and proceed with request.) (Agent: If yes, when and what did the pharmacy advise?)  Is this the correct pharmacy for this prescription? Yes If no, delete pharmacy and type the correct one.  This is the patient's preferred pharmacy:  [id number] [place], [place] - [address] [place] [address] Phone: [phone] Fax: [phone]   Has the prescription been filled recently? No  Is the patient out of the medication? Yes  Has the patient been seen for an appointment in the last year OR does the patient have an upcoming appointment? Yes  Can we respond through MyChart? Yes  Agent: Please be advised that Rx refills may take up to 3 business days. We ask that you follow-up with your pharmacy.

## 2024-01-20 ENCOUNTER — Other Ambulatory Visit: Payer: Self-pay | Admitting: Cardiovascular Disease

## 2024-01-21 ENCOUNTER — Other Ambulatory Visit: Payer: Self-pay | Admitting: Family

## 2024-01-22 NOTE — Telephone Encounter (Signed)
 Error

## 2024-01-29 ENCOUNTER — Ambulatory Visit: Attending: Cardiovascular Disease | Admitting: *Deleted

## 2024-01-29 DIAGNOSIS — Z5181 Encounter for therapeutic drug level monitoring: Secondary | ICD-10-CM | POA: Diagnosis not present

## 2024-01-29 DIAGNOSIS — I482 Chronic atrial fibrillation, unspecified: Secondary | ICD-10-CM

## 2024-01-29 LAB — POCT INR: POC INR: 2.4

## 2024-01-29 NOTE — Patient Instructions (Signed)
 Description   Continue taking 2 tablets daily except 1.5 tablets on Tuesday, Thursday, and Saturday. Repeat INR in 5 weeks.  Anticoagulation Clinic [phone]      1st Floor: - Lobby - Registration  - Pharmacy  - Lab - Cafe  2nd Floor: - PV Lab - Diagnostic Testing (echo, CT, nuclear med)  3rd Floor: - Vacant  4th Floor: - TCTS (cardiothoracic surgery) - AFib Clinic - Structural Heart Clinic - Vascular Surgery  - Vascular Ultrasound  5th Floor: - [facility] (general and EP) - Clinical Pharmacy for coumadin, hypertension, lipid, weight-loss medications, and med management appointments    Valet parking services will be available as well.

## 2024-02-24 ENCOUNTER — Other Ambulatory Visit: Payer: Self-pay | Admitting: Family Medicine

## 2024-02-24 ENCOUNTER — Other Ambulatory Visit: Payer: Self-pay | Admitting: Family

## 2024-02-24 ENCOUNTER — Telehealth: Payer: Self-pay | Admitting: Family

## 2024-02-24 MED ORDER — TRAZODONE HCL 50 MG PO TABS: 50.0000 mg | ORAL_TABLET | Freq: Every evening | ORAL | 1 refills | Status: DC | PRN

## 2024-02-24 NOTE — Telephone Encounter (Signed)
   Called patient to schedule as noted in refill refusal. He is confused about needing an additional appointment, he has his annual each October and has never been requested to come in other than yearly exam. Benjamin Mckay is requesting PCP Rice Chamorro review refill request and call him back if she is requiring appointment, otherwise would like refill as requested. Please follow up with Gwinda Leopard.

## 2024-02-24 NOTE — Telephone Encounter (Signed)
 Please let him know we fixed the Trazodone  prescription for him. Sorry for the confusion- another provider had filled for me last month while I was on vacation and the refill was being sent to the wrong provider.

## 2024-02-24 NOTE — Telephone Encounter (Signed)
 Spoke with pt, pt is aware and expressed understanding.

## 2024-02-24 NOTE — Telephone Encounter (Unsigned)
 Copied from CRM [id number]. Topic: Clinical - Medication Refill >> Feb 24, 2024 11:40 AM Earnestine Goes B wrote: Medication: traZODone  (DESYREL ) 50 MG tablet   Has the patient contacted their pharmacy? Yes (Agent: If no, request that the patient contact the pharmacy for the refill. If patient does not wish to contact the pharmacy document the reason why and proceed with request.) (Agent: If yes, when and what did the pharmacy advise?)  This is the patient's preferred pharmacy:  [id number] [place] [facility], [place] - [address] [place] [address] Phone: [phone] Fax: [phone]  Is this the correct pharmacy for this prescription? Yes If no, delete pharmacy and type the correct one.   Has the prescription been filled recently? Yes  Is the patient out of the medication? Yes  Has the patient been seen for an appointment in the last year OR does the patient have an upcoming appointment? Yes  Can we respond through MyChart? Yes  Agent: Please be advised that Rx refills may take up to 3 business days. We ask that you follow-up with your pharmacy.

## 2024-02-24 NOTE — Telephone Encounter (Signed)
 Copied from CRM [id number]. Topic: Clinical - Medication Refill >> Feb 24, 2024 11:41 AM Earnestine Goes B wrote: Medication: traZODone  (DESYREL ) 50 MG tablet   Has the patient contacted their pharmacy? Yes (Agent: If no, request that the patient contact the pharmacy for the refill. If patient does not wish to contact the pharmacy document the reason why and proceed with request.) (Agent: If yes, when and what did the pharmacy advise?)  This is the patient's preferred pharmacy:  [id number] [place] [facility], [place] - [address] [place] [address] Phone: [phone] Fax: [phone]  Is this the correct pharmacy for this prescription? Yes If no, delete pharmacy and type the correct one.   Has the prescription been filled recently? Yes  Is the patient out of the medication? Yes  Has the patient been seen for an appointment in the last year OR does the patient have an upcoming appointment? Yes  Can we respond through MyChart? Yes  Agent: Please be advised that Rx refills may take up to 3 business days. We ask that you follow-up with your pharmacy.

## 2024-02-24 NOTE — Telephone Encounter (Signed)
Spoke with pt, pt is aware Rx has been sent in.  

## 2024-03-04 ENCOUNTER — Ambulatory Visit (INDEPENDENT_AMBULATORY_CARE_PROVIDER_SITE_OTHER): Payer: PPO

## 2024-03-04 ENCOUNTER — Ambulatory Visit: Attending: Cardiovascular Disease | Admitting: *Deleted

## 2024-03-04 DIAGNOSIS — I482 Chronic atrial fibrillation, unspecified: Secondary | ICD-10-CM

## 2024-03-04 DIAGNOSIS — I442 Atrioventricular block, complete: Secondary | ICD-10-CM | POA: Diagnosis not present

## 2024-03-04 DIAGNOSIS — Z5181 Encounter for therapeutic drug level monitoring: Secondary | ICD-10-CM

## 2024-03-04 LAB — CUP PACEART REMOTE DEVICE CHECK
Battery Remaining Longevity: 115 mo
Battery Remaining Percentage: 95.5 %
Battery Voltage: 3.01 V
Brady Statistic RV Percent Paced: 54 %
Date Time Interrogation Session: 20250522020013
Lead Channel Impedance Value: 410 Ohm
Lead Channel Pacing Threshold Amplitude: 0.75 V
Lead Channel Pacing Threshold Pulse Width: 0.5 ms
Lead Channel Sensing Intrinsic Amplitude: 7.2 mV
Lead Channel Setting Pacing Amplitude: 2.5 V
Lead Channel Setting Pacing Pulse Width: 0.5 ms
Lead Channel Setting Sensing Sensitivity: 2.5 mV
Pulse Gen Model: 1272
Pulse Gen Serial Number: 8208618

## 2024-03-04 LAB — POCT INR: POC INR: 2.6

## 2024-03-04 NOTE — Patient Instructions (Signed)
 Description   Continue taking 2 tablets daily except 1.5 tablets on Tuesday, Thursday, and Saturday. Repeat INR in 6 weeks.  Anticoagulation Clinic [phone]

## 2024-03-08 ENCOUNTER — Ambulatory Visit: Payer: Self-pay | Admitting: Cardiovascular Disease

## 2024-03-21 ENCOUNTER — Other Ambulatory Visit: Payer: Self-pay | Admitting: Cardiovascular Disease

## 2024-03-21 DIAGNOSIS — I482 Chronic atrial fibrillation, unspecified: Secondary | ICD-10-CM

## 2024-03-21 DIAGNOSIS — Z7901 Long term (current) use of anticoagulants: Secondary | ICD-10-CM

## 2024-04-15 ENCOUNTER — Ambulatory Visit: Attending: Cardiovascular Disease | Admitting: *Deleted

## 2024-04-15 DIAGNOSIS — Z5181 Encounter for therapeutic drug level monitoring: Secondary | ICD-10-CM

## 2024-04-15 DIAGNOSIS — I482 Chronic atrial fibrillation, unspecified: Secondary | ICD-10-CM | POA: Diagnosis not present

## 2024-04-15 LAB — POCT INR: POC INR: 2.2

## 2024-04-15 NOTE — Progress Notes (Signed)
Please see anticoagulation encounter.

## 2024-04-15 NOTE — Patient Instructions (Signed)
 Description   Continue taking 2 tablets daily except 1.5 tablets on Tuesday, Thursday, and Saturday. Repeat INR in 6 weeks.  Anticoagulation Clinic [phone]

## 2024-04-21 NOTE — Progress Notes (Signed)
 Remote pacemaker transmission.

## 2024-05-08 ENCOUNTER — Other Ambulatory Visit: Payer: Self-pay | Admitting: Cardiovascular Disease

## 2024-05-08 DIAGNOSIS — I6523 Occlusion and stenosis of bilateral carotid arteries: Secondary | ICD-10-CM

## 2024-05-08 DIAGNOSIS — I25119 Atherosclerotic heart disease of native coronary artery with unspecified angina pectoris: Secondary | ICD-10-CM

## 2024-05-23 ENCOUNTER — Other Ambulatory Visit: Payer: Self-pay | Admitting: Cardiovascular Disease

## 2024-06-03 ENCOUNTER — Ambulatory Visit (INDEPENDENT_AMBULATORY_CARE_PROVIDER_SITE_OTHER): Payer: PPO

## 2024-06-03 ENCOUNTER — Other Ambulatory Visit (HOSPITAL_COMMUNITY): Payer: Self-pay

## 2024-06-03 ENCOUNTER — Telehealth: Payer: Self-pay

## 2024-06-03 ENCOUNTER — Ambulatory Visit: Payer: Self-pay | Admitting: Cardiovascular Disease

## 2024-06-03 ENCOUNTER — Ambulatory Visit: Attending: Cardiovascular Disease | Admitting: *Deleted

## 2024-06-03 DIAGNOSIS — I482 Chronic atrial fibrillation, unspecified: Secondary | ICD-10-CM

## 2024-06-03 DIAGNOSIS — Z7901 Long term (current) use of anticoagulants: Secondary | ICD-10-CM

## 2024-06-03 DIAGNOSIS — I442 Atrioventricular block, complete: Secondary | ICD-10-CM | POA: Diagnosis not present

## 2024-06-03 LAB — CUP PACEART REMOTE DEVICE CHECK
Battery Remaining Longevity: 110 mo
Battery Remaining Percentage: 95.5 %
Battery Voltage: 3.01 V
Brady Statistic RV Percent Paced: 60 %
Date Time Interrogation Session: 20250821020023
Lead Channel Impedance Value: 410 Ohm
Lead Channel Pacing Threshold Amplitude: 0.75 V
Lead Channel Pacing Threshold Pulse Width: 0.5 ms
Lead Channel Sensing Intrinsic Amplitude: 9.9 mV
Lead Channel Setting Pacing Amplitude: 2.5 V
Lead Channel Setting Pacing Pulse Width: 0.5 ms
Lead Channel Setting Sensing Sensitivity: 2.5 mV
Pulse Gen Model: 1272
Pulse Gen Serial Number: 8208618

## 2024-06-03 LAB — POCT INR: INR: 2.5 (ref 2.0–3.0)

## 2024-06-03 MED ORDER — WARFARIN SODIUM 2.5 MG PO TABS: ORAL_TABLET | ORAL | 1 refills | Status: AC

## 2024-06-03 NOTE — Telephone Encounter (Signed)
 Patient Advocate Encounter   The patient was approved for a [employer] grant that will help cover the cost of JARDIANCE  AND ENTRESTO  Total amount awarded, $4,500.  Effective: 05/04/24 - 05/03/25   APW:[id number] ERW:[id number] Hmnle:[id number] PI:[id number]   Pharmacy provided with approval and processing information. Patient informed via RHONA Ileana Lehmann, CPhT  Pharmacy Patient Advocate Specialist  Direct Number: [phone] Fax: [phone]

## 2024-06-03 NOTE — Patient Instructions (Signed)
 Description   Continue taking 2 tablets daily except 1.5 tablets on Tuesday, Thursday, and Saturday. Repeat INR in 6 weeks.  Anticoagulation Clinic [phone]

## 2024-06-03 NOTE — Progress Notes (Signed)
 INR-2.5; Continue taking 2 tablets daily except 1.5 tablets on Tuesday, Thursday, and Saturday. Repeat INR in 6 weeks.  Anticoagulation Clinic [phone]

## 2024-06-08 DIAGNOSIS — Z961 Presence of intraocular lens: Secondary | ICD-10-CM | POA: Diagnosis not present

## 2024-06-08 DIAGNOSIS — H26491 Other secondary cataract, right eye: Secondary | ICD-10-CM | POA: Diagnosis not present

## 2024-06-08 DIAGNOSIS — H43812 Vitreous degeneration, left eye: Secondary | ICD-10-CM | POA: Diagnosis not present

## 2024-06-08 DIAGNOSIS — H35371 Puckering of macula, right eye: Secondary | ICD-10-CM | POA: Diagnosis not present

## 2024-06-29 ENCOUNTER — Ambulatory Visit: Payer: Self-pay | Admitting: Family

## 2024-06-29 ENCOUNTER — Encounter: Payer: Self-pay | Admitting: Family

## 2024-06-29 ENCOUNTER — Ambulatory Visit: Admitting: Family

## 2024-06-29 VITALS — BP 132/80 | HR 66 | Resp 18 | Ht 69.0 in | Wt 202.2 lb

## 2024-06-29 DIAGNOSIS — L0390 Cellulitis, unspecified: Secondary | ICD-10-CM

## 2024-06-29 DIAGNOSIS — K429 Umbilical hernia without obstruction or gangrene: Secondary | ICD-10-CM | POA: Diagnosis not present

## 2024-06-29 LAB — CBC WITH DIFFERENTIAL/PLATELET
Basophils Absolute: 0 K/uL (ref 0.0–0.1)
Basophils Relative: 0.6 % (ref 0.0–3.0)
Eosinophils Absolute: 0.2 K/uL (ref 0.0–0.7)
Eosinophils Relative: 2.8 % (ref 0.0–5.0)
HCT: 52.3 % — ABNORMAL HIGH (ref 39.0–52.0)
Hemoglobin: 17.4 g/dL — ABNORMAL HIGH (ref 13.0–17.0)
Lymphocytes Relative: 29.6 % (ref 12.0–46.0)
Lymphs Abs: 2 K/uL (ref 0.7–4.0)
MCHC: 33.2 g/dL (ref 30.0–36.0)
MCV: 97.5 fl (ref 78.0–100.0)
Monocytes Absolute: 0.4 K/uL (ref 0.1–1.0)
Monocytes Relative: 5.3 % (ref 3.0–12.0)
Neutro Abs: 4.2 K/uL (ref 1.4–7.7)
Neutrophils Relative %: 61.7 % (ref 43.0–77.0)
Platelets: 156 K/uL (ref 150.0–400.0)
RBC: 5.37 Mil/uL (ref 4.22–5.81)
RDW: 13.5 % (ref 11.5–15.5)
WBC: 6.9 K/uL (ref 4.0–10.5)

## 2024-06-29 MED ORDER — DOXYCYCLINE HYCLATE 100 MG PO TABS: 100.0000 mg | ORAL_TABLET | Freq: Two times a day (BID) | ORAL | 0 refills | Status: DC

## 2024-06-29 NOTE — Progress Notes (Signed)
 Benjamin Mckay is a 78 y.o. 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 Jude pacemaker 2013 11/06/2013   Chronic atrial fibrillation (HCC) 01/29/2013   Long term (current) use of anticoagulants 01/29/2013   HTN (hypertension) 02/01/2012   Carotid stenosis 02/01/2012   Renal artery stenosis (HCC) 02/01/2012   Coronary artery disease involving native coronary artery of native heart with angina pectoris (HCC) 02/01/2012   Esophageal reflux 08/21/2011   Hemorrhage of rectum and anus 04/26/2011   Umbilical hernia 05/22/2010   Cardiac dysrhythmia 11/11/2007   Transient cerebral ischemia 11/11/2007   ESOPHAGEAL STRICTURE 11/11/2007   Diverticulosis of colon 11/11/2007   Non-Hodgkin's lymphoma of intestine (HCC) 09/03/2007    Current Outpatient Medications  Medication Sig Dispense Refill   acetaminophen  (TYLENOL ) 500 MG tablet Take 1,000 mg by mouth every 6 (six) hours as needed for moderate pain (pain score 4-6).     doxycycline  (VIBRA -TABS) 100 MG tablet Take 1 tablet (100 mg total) by mouth 2 (two) times daily. 14 tablet 0   Evolocumab  (REPATHA  SURECLICK) 140 MG/ML SOAJ INJECT 140 MG (CONTENTS OF 1 INJECTOR) INTO THE  SKIN EVERY 14 (FOURTEEN) DAYS 6 mL 3   hydrocortisone  cream 1 % Apply 1 Application topically 2 (two) times daily as needed for itching.     JARDIANCE  10 MG TABS tablet TAKE 1 TABLET BY MOUTH DAILY BEFORE BREAKFAST. 30 tablet 11   metoprolol  succinate (TOPROL -XL) 25 MG 24 hr tablet TAKE 1 TABLET BY MOUTH TWICE A DAY 180 tablet 3   pravastatin  (PRAVACHOL ) 80 MG tablet TAKE 1 TABLET BY MOUTH EVERY DAY 90 tablet 3   sacubitril -valsartan  (ENTRESTO ) 24-26 MG TAKE 1 TABLET BY MOUTH TWICE A DAY 180 tablet 3   spironolactone  (ALDACTONE ) 25 MG tablet TAKE 1/2 TABLET (12.5 MG TOTAL) BY MOUTH EVERY OTHER DAY. 45 tablet 3   tamsulosin  (FLOMAX ) 0.4 MG CAPS capsule Take 0.4 mg by mouth at bedtime.     traZODone  (DESYREL ) 50 MG tablet Take 1 tablet (50 mg total) by mouth at bedtime as needed for sleep. 90 tablet 1   warfarin (COUMADIN ) 2.5 MG tablet TAKE 2 TABLETS BY MOUTH DAILY EXCEPT 1.5 TABLETS ON TUESDAY, THURSDAY, AND SATURDAY OR AS DIRECTED BY THE ANTICOAGULATION CLINIC 170 tablet 1   No current facility-administered medications for this visit.    Allergies: Statins, Adhesive [tape], Latex, and Penicillins  Past Medical History:  Diagnosis Date   Atrial fibrillation (HCC)    Cardiomyopathy, ischemic 11/07/2013   Cataract    bil cataracts removed   CHB (complete heart block) (HCC) 11/06/2013   Chronic combined systolic and diastolic CHF (congestive heart failure) (HCC)    Clotting disorder (HCC)    TIA , cva - PT ON COUMADINE   Colon polyps    Colon polyps    Congenital heart block  Constipation    CVA (cerebral infarction)    Diverticulosis    Esophageal stricture    Family history of adverse reaction to anesthesia    Heart murmur    Hyperlipidemia    Hypertension    Hyperthyroidism 11/16/2015   Lymphoma (HCC) 1998   of colon    Presence of permanent cardiac pacemaker    St. Jude  pt.states he is totally dependent on pacemaker   Shortness of breath dyspnea    Pulmonary Effusion    Stroke (HCC)    TIA, cva   Umbilical hernia    Urinary frequency     Past Surgical History:  Procedure Laterality Date   APPENDECTOMY  1953   CARDIAC CATHETERIZATION  01/06/2003   Recommend medical therapy   CARDIOVASCULAR STRESS TEST  07/16/2012   Mild-moderate perfusion defect seen in Basal inferior, Mid inferior, and Apica lateral consistent with infarct/scar. No scintigraphic evidence for inducible myocardial ischemia. No ECG changes. EKG negative for ischemia.   CAROTID DOPPLER  11/04/2012   Proximal Rt ICA 50-99% diameter reduction; Lft Bulb demonstrated mild amount homogeneous plaque-not hemodynamically significant; Lft ICA-normal patency.   CHEST TUBE INSERTION Right 03/15/2016   Procedure: INSERTION PLEURAL DRAINAGE CATHETER;  Surgeon: Maude Fleeta Ochoa, MD;  Location: [facility] OR;  Service: Thoracic;  Laterality: Right;   COLECTOMY     for lymphoma   COLONOSCOPY     neck fusion     PACEMAKER GENERATOR CHANGE  01/31/2012   St Jude Med Accent DR RF model [id number] serial [id number]   PACEMAKER PLACEMENT     replaced 3 x   PERMANENT PACEMAKER GENERATOR CHANGE N/A 01/31/2012   Procedure: PERMANENT PACEMAKER GENERATOR CHANGE;  Surgeon: Jerel Balding, MD;  Location: [facility] CATH LAB;    PLEURADESIS N/A 04/02/2016   Procedure: ELNOR;  Surgeon: Maude Fleeta Ochoa, MD;  Location: [facility] OR;  Service: Thoracic;  Laterality: N/A;   PPM GENERATOR CHANGEOUT N/A 09/04/2023   Procedure: PPM GENERATOR CHANGEOUT;  Surgeon: Balding Jerel, MD;  Location: [facility] INVASIVE CV LAB;  Service: Cardiovascular;  Laterality: N/A;   REMOVAL OF PLEURAL DRAINAGE CATHETER Right 07/26/2016   Procedure: REMOVAL OF PLEURAL DRAINAGE CATHETER;  Surgeon: Maude Fleeta Ochoa, MD;  Location: [facility] OR;  Service: Thoracic;  Laterality: Right;   TONSILLECTOMY     TRANSTHORACIC ECHOCARDIOGRAM  11/04/2012   EF 35-40%, systolic function moderately reduced, mild regurg of the aortic and mitral valves.   VIDEO ASSISTED THORACOSCOPY Right 04/02/2016    Procedure: Right VIDEO ASSISTED THORACOSCOPY with Biopsies and drainage pleural effusion;  Surgeon: Maude Fleeta Ochoa, MD;  Location: [facility] OR;  Service: Thoracic;  Laterality: Right;    Family History  Problem Relation Age of Onset   Prostate cancer Father    Heart disease Mother    Stroke Mother    Diabetes Maternal Grandmother    Colon cancer Neg Hx    Heart attack Neg Hx    Esophageal cancer Neg Hx    Stomach cancer Neg Hx    Rectal cancer Neg Hx     Social History   Tobacco Use   Smoking status: Never   Smokeless tobacco: Never  Substance Use Topics   Alcohol use: No    Alcohol/week: 0.0 standard drinks of alcohol    Subjective:   Known history of umbilical hernia x 20 years; noted on Friday that area of concern became more prominent/ surrounding redness and has been draining;  He notes the area is mildly painful; no fever or  nausea or vomiting; no changes in bowel habits;    Objective:  Vitals:   06/29/24 1117  BP: 132/80  Pulse: 66  Resp: 18  SpO2: 98%  Weight: 202 lb 3.2 oz (91.7 kg)  Height: 5' 9 (1.753 m)    General: Well developed, well nourished, in no acute distress  Skin : Warm and dry.  Head: Normocephalic and atraumatic  Lungs: Respirations unlabored;  Neurologic: Alert and oriented; speech intact; face symmetrical; moves all extremities well; CNII-XII intact without focal deficit   Assessment:  1. Umbilical hernia without obstruction and without gangrene   2. Cellulitis, unspecified cellulitis site     Plan:  Will update STAT CBC, CMP today; start Doxycycline  100 mg bid x 7 days; emergent referral updated to general surgery; patient may be directed to the ER based on lab results today; follow up to be determined.   No follow-ups on file.  Orders Placed This Encounter  Procedures   CBC with Differential/Platelet   Comp Met (CMET)   Ambulatory referral to General Surgery    Referral Priority:   Emergency    Referral Type:   Surgical    Referral  Reason:   Specialty Services Required    Requested Specialty:   General Surgery    Number of Visits Requested:   1    Requested Prescriptions   Signed Prescriptions Disp Refills   doxycycline  (VIBRA -TABS) 100 MG tablet 14 tablet 0    Sig: Take 1 tablet (100 mg total) by mouth 2 (two) times daily.

## 2024-06-30 ENCOUNTER — Telehealth: Payer: Self-pay

## 2024-06-30 ENCOUNTER — Ambulatory Visit: Payer: Self-pay | Admitting: General Surgery

## 2024-06-30 DIAGNOSIS — K429 Umbilical hernia without obstruction or gangrene: Secondary | ICD-10-CM | POA: Diagnosis not present

## 2024-06-30 DIAGNOSIS — I4819 Other persistent atrial fibrillation: Secondary | ICD-10-CM | POA: Diagnosis not present

## 2024-06-30 NOTE — Telephone Encounter (Signed)
   Pre-operative Risk Assessment    Patient Name: COURAGE BIGLOW  DOB: 04/19/1946 MRN: [id number]   Date of last office visit: 04/15/24 Date of next office visit: none   Request for Surgical Clearance    Procedure:  Laparoscopic Ventral hernia  Date of Surgery:  Clearance TBD                                 Surgeon:  Lynda Nims, MD Surgeon's Group or Practice Name:  [facility] Phone number:  [phone] Fax number:  [phone]   Type of Clearance Requested:   - Medical    Type of Anesthesia:  General    Additional requests/questions:  N/A  SignedMerlynn LITTIE Essex   06/30/2024, 3:20 PM

## 2024-06-30 NOTE — H&P (Signed)
 Chief Complaint: New Consultation (Umbilical hernia)       History of Present Illness: Benjamin Mckay is a 78 y.o. male who is seen today as an office consultation at the request of Dr. Jason for evaluation of New Consultation (Umbilical hernia) .       History of Present Illness Benjamin Mckay is a 78 year old male with atrial fibrillation and a pacemaker who presents with an umbilical hernia. He was referred by Dr. Drema for evaluation of his umbilical hernia.   He has had an umbilical hernia for over twenty years, which has recently increased in size and become red and raw since last Friday. The hernia is uncomfortable with increased sensitivity above it, but there is no associated pain. He denies nausea or vomiting.   He is on warfarin for atrial fibrillation, with a recent INR of 2.5. He has previously been off warfarin for three to four days for procedures without issues, but recalls bleeding through a scar when not taken off warfarin for a pacemaker battery replacement.   His past surgical history includes a colectomy over twenty years ago, and procedures for lung congestion and a spinal issue. He denies other intra-abdominal surgeries.     Review of Systems: A complete review of systems was obtained from the patient.  I have reviewed this information and discussed as appropriate with the patient.  See HPI as well for other ROS.   Review of Systems  Constitutional:  Negative for fever.  HENT:  Negative for congestion.   Eyes:  Negative for blurred vision.  Respiratory:  Negative for cough, shortness of breath and wheezing.   Cardiovascular:  Negative for chest pain and palpitations.  Gastrointestinal:  Negative for heartburn.  Genitourinary:  Negative for dysuria.  Musculoskeletal:  Negative for myalgias.  Skin:  Negative for rash.  Neurological:  Negative for dizziness and headaches.  Psychiatric/Behavioral:  Negative for depression and suicidal ideas.   All other  systems reviewed and are negative.       Medical History: Past Medical History Past Medical History: Diagnosis Date  Arrhythmia    CHF (congestive heart failure) (CMS/HHS-HCC)    Heart valve disease    History of cancer    History of stroke    Hyperlipidemia         Problem List There is no problem list on file for this patient.     Past Surgical History Past Surgical History: Procedure Laterality Date  APPENDECTOMY      COLON SURGERY      SPINE SURGERY          Allergies Allergies Allergen Reactions  Penicillins Rash      Medications Ordered Prior to Encounter Current Outpatient Medications on File Prior to Visit Medication Sig Dispense Refill  doxycycline  (VIBRA -TABS) 100 MG tablet Take 100 mg by mouth 2 (two) times daily      ENTRESTO  24-26 mg tablet Take 1 tablet by mouth 2 (two) times daily      JARDIANCE  10 mg tablet Take 10 mg by mouth every morning before breakfast      metoprolol  SUCCinate (TOPROL -XL) 25 MG XL tablet Take 25 mg by mouth 2 (two) times daily      pravastatin  (PRAVACHOL ) 80 MG tablet Take 80 mg by mouth once daily      REPATHA  SURECLICK 140 mg/mL PnIj INJECT 140 MG (CONTENTS OF 1 INJECTOR) INTO THE SKIN EVERY 14 (FOURTEEN) DAYS      spironolactone  (ALDACTONE ) 25 MG  tablet TAKE 1/2 TABLET (12.5 MG TOTAL) BY MOUTH EVERY OTHER DAY.      tamsulosin  (FLOMAX ) 0.4 mg capsule Take 0.4 mg by mouth at bedtime      warfarin (COUMADIN ) 2.5 MG tablet TAKE 2 TABLETS BY MOUTH DAILY EXCEPT 1.5 TABLETS ON TUESDAY, THURSDAY, AND SATURDAY OR AS DIRECTED BY THE ANTICOAGULATION CLINIC        No current facility-administered medications on file prior to visit.      Family History Family History Problem Relation Age of Onset  Stroke Mother    Coronary Artery Disease (Blocked arteries around heart) Mother        Tobacco Use History Social History    Tobacco Use Smoking Status Never Smokeless Tobacco Never      Social History Social History     Socioeconomic History  Marital status: Married Tobacco Use  Smoking status: Never  Smokeless tobacco: Never Substance and Sexual Activity  Drug use: Never    Social Drivers of Manufacturing engineer Strain: Low Risk  (08/07/2020)   Received from [facility]   Overall Financial Resource Strain (CARDIA)    Difficulty of Paying Living Expenses: Not hard at all Food Insecurity: No Food Insecurity (08/07/2020)   Received from [facility]   Hunger Vital Sign    Within the past 12 months, you worried that your food would run out before you got the money to buy more.: Never true    Within the past 12 months, the food you bought just didn't last and you didn't have money to get more.: Never true Transportation Needs: No Transportation Needs (08/07/2020)   Received from [facility] - Transportation    Lack of Transportation (Medical): No    Lack of Transportation (Non-Medical): No Physical Activity: Sufficiently Active (08/07/2020)   Received from [facility]   Exercise Vital Sign    On average, how many days per week do you engage in moderate to strenuous exercise (like a brisk walk)?: 5 days    On average, how many minutes do you engage in exercise at this level?: 30 min Stress: No Stress Concern Present (08/07/2020)   Received from [facility] - Occupational Stress Questionnaire    Feeling of Stress : Not at all Social Connections: Socially Integrated (08/07/2020)   Received from [facility]   Social Connection and Isolation Panel    In a typical week, how many times do you talk on the phone with family, friends, or neighbors?: More than three times a week    How often do you get together with friends or relatives?: More than three times a week    How often do you attend church or religious services?: More than 4 times per year    Do you belong to any clubs or organizations such as church groups, unions, fraternal or  athletic groups, or school groups?: Yes    How often do you attend meetings of the clubs or organizations you belong to?: More than 4 times per year    Are you married, widowed, divorced, separated, never married, or living with a partner?: Married Housing Stability: Unknown (06/30/2024)   Housing Stability Vital Sign    Homeless in the Last Year: No      Objective:     Vitals:   06/30/24 1038 BP: (!) 143/80 Pulse: 98 Temp: 36.7 C (98 F) SpO2: 98% Weight: 92.5 kg (204 lb) Height: 175.3 cm (5'  9)   Body mass index is 30.13 kg/m.   Physical Exam Constitutional:      General: He is not in acute distress.    Appearance: Normal appearance.  HENT:     Head: Normocephalic.     Nose: No rhinorrhea.     Mouth/Throat:     Mouth: Mucous membranes are moist.     Pharynx: Oropharynx is clear.  Eyes:     General: No scleral icterus.    Pupils: Pupils are equal, round, and reactive to light.  Cardiovascular:     Rate and Rhythm: Normal rate.     Pulses: Normal pulses.  Pulmonary:     Effort: Pulmonary effort is normal. No respiratory distress.     Breath sounds: No stridor. No wheezing.  Abdominal:     General: Abdomen is flat. There is no distension.     Tenderness: There is no abdominal tenderness. There is no guarding or rebound.     Hernia: A hernia is present. Hernia is present in the umbilical area.     Comments: Some superficial ulceration   Musculoskeletal:        General: Normal range of motion.     Cervical back: Normal range of motion and neck supple.  Skin:    General: Skin is warm and dry.     Capillary Refill: Capillary refill takes less than 2 seconds.     Coloration: Skin is not jaundiced.  Neurological:     General: No focal deficit present.     Mental Status: He is alert and oriented to person, place, and time. Mental status is at baseline.  Psychiatric:        Mood and Affect: Mood normal.        Thought Content: Thought content normal.         Judgment: Judgment normal.       Hernia Size:3cm Incarcerated: yes Initial Hernia     Assessment and Plan: Diagnoses and all orders for this visit:   Umbilical hernia without obstruction or gangrene   Chronic a-fib (CMS/HHS-HCC)     Benjamin Mckay is a 78 y.o. male  Will need to be off coumadin . Will need Cards clearance Will need to expedite surgery as he does have some ulceration/superficial erosion of the skin at the umbilical area.     1.  We will proceed to the OR for a lap ventral hernia repair with mesh. 2. All risks and benefits were discussed with the patient, to generally include infection, bleeding, damage to surrounding structures, acute and chronic nerve pain, and recurrence. Alternatives were offered and described.  All questions were answered and the patient voiced understanding of the procedure and wishes to proceed at this point.             No follow-ups on file.   Lynda Leos, MD, [facility] Surgery, [place] [facility] Minimally Invasive Surgery

## 2024-07-03 LAB — COMPREHENSIVE METABOLIC PANEL WITH GFR
ALT: 13 U/L (ref 0–53)
AST: 16 U/L (ref 0–37)
Albumin: 4.1 g/dL (ref 3.5–5.2)
Alkaline Phosphatase: 77 U/L (ref 39–117)
BUN: 11 mg/dL (ref 6–23)
CO2: 30 meq/L (ref 19–32)
Calcium: 9.3 mg/dL (ref 8.4–10.5)
Chloride: 105 meq/L (ref 96–112)
Creatinine, Ser: 1.14 mg/dL (ref 0.40–1.50)
GFR: 61.6 mL/min (ref >60.00)
Glucose, Bld: 90 mg/dL (ref 70–99)
Potassium: 4.2 meq/L (ref 3.5–5.1)
Sodium: 138 meq/L (ref 135–145)
Total Bilirubin: 1.5 mg/dL — ABNORMAL HIGH (ref 0.2–1.2)
Total Protein: 6.9 g/dL (ref 6.0–8.3)

## 2024-07-05 ENCOUNTER — Telehealth (HOSPITAL_BASED_OUTPATIENT_CLINIC_OR_DEPARTMENT_OTHER): Payer: Self-pay

## 2024-07-05 NOTE — Telephone Encounter (Signed)
 Patient with diagnosis of atrial fibrillation on warfarin for anticoagulation.    Procedure:  Laparoscopic Ventral hernia   Date of Surgery:  Clearance TBD        CHA2DS2-VASc Score = 5   This indicates a 7.2% annual risk of stroke. The patient's score is based upon: CHF History: 1 HTN History: 1 Diabetes History: 0 Stroke History: 0 Vascular Disease History: 1 Age Score: 2 Gender Score: 0   CrCl 69 Platelet count 156  Patient has not had an Afib/aflutter ablation or Watchman within the last 3 months or DCCV within the last 30 days   Per office protocol, patient can hold Eliquis for 2 days prior to procedure.   Patient will not need bridging with Lovenox (enoxaparin) around procedure.  **This guidance is not considered finalized until pre-operative APP has relayed final recommendations.**

## 2024-07-05 NOTE — Telephone Encounter (Signed)
 Called patient to set up an televisit appointment for pre-op clearance on 07/06/24 @11 :00. Meds, Rec, and consent done.      Patient Consent for Virtual Visit        Benjamin Mckay [facility] has provided verbal consent on 07/05/2024 for a virtual visit (video or telephone).   CONSENT FOR VIRTUAL VISIT FOR:  Benjamin Mckay  By participating in this virtual visit I agree to the following:  I hereby voluntarily request, consent and authorize [facility] and its employed or contracted physicians, physician assistants, nurse practitioners or other licensed health care professionals (the Practitioner), to provide me with telemedicine health care services (the "Services) as deemed necessary by the treating Practitioner. I acknowledge and consent to receive the Services by the Practitioner via telemedicine. I understand that the telemedicine visit will involve communicating with the Practitioner through live audiovisual communication technology and the disclosure of certain medical information by electronic transmission. I acknowledge that I have been given the opportunity to request an in-person assessment or other available alternative prior to the telemedicine visit and am voluntarily participating in the telemedicine visit.  I understand that I have the right to withhold or withdraw my consent to the use of telemedicine in the course of my care at any time, without affecting my right to future care or treatment, and that the Practitioner or I may terminate the telemedicine visit at any time. I understand that I have the right to inspect all information obtained and/or recorded in the course of the telemedicine visit and may receive copies of available information for a reasonable fee.  I understand that some of the potential risks of receiving the Services via telemedicine include:  Delay or interruption in medical evaluation due to technological equipment failure or disruption; Information  transmitted may not be sufficient (e.g. poor resolution of images) to allow for appropriate medical decision making by the Practitioner; and/or  In rare instances, security protocols could fail, causing a breach of personal health information.  Furthermore, I acknowledge that it is my responsibility to provide information about my medical history, conditions and care that is complete and accurate to the best of my ability. I acknowledge that Practitioner's advice, recommendations, and/or decision may be based on factors not within their control, such as incomplete or inaccurate data provided by me or distortions of diagnostic images or specimens that may result from electronic transmissions. I understand that the practice of medicine is not an exact science and that Practitioner makes no warranties or guarantees regarding treatment outcomes. I acknowledge that a copy of this consent can be made available to me via my patient portal [facility] MyChart), or I can request a printed copy by calling the office of [facility].    I understand that my insurance will be billed for this visit.   I have read or had this consent read to me. I understand the contents of this consent, which adequately explains the benefits and risks of the Services being provided via telemedicine.  I have been provided ample opportunity to ask questions regarding this consent and the Services and have had my questions answered to my satisfaction. I give my informed consent for the services to be provided through the use of telemedicine in my medical care

## 2024-07-05 NOTE — Telephone Encounter (Signed)
 Called patient to set up an televisit appointment for pre-op clearance on 07/06/24 @11 :00. Meds, Rec, and consent done.

## 2024-07-05 NOTE — Telephone Encounter (Signed)
   Name: Benjamin Mckay [facility] [place]  DOB: 07-11-1946  MRN: [id number]  Primary Cardiologist: Jerel Balding, MD   Preoperative team, please contact this patient and set up a phone call appointment for further preoperative risk assessment. Please obtain consent and complete medication review. Thank you for your help.  I confirm that guidance regarding antiplatelet and oral anticoagulation therapy has been completed and, if necessary, noted below.  Per office protocol, patient can hold Eliquis for 2 days prior to procedure.   Patient will not need bridging with Lovenox (enoxaparin) around procedure. Please resume Eliquis as soon as possible postprocedure, at the discretion of the surgeon.    I also confirmed the patient resides in the state of [place] . As per [facility] Medical Board telemedicine laws, the patient must reside in the state in which the provider is licensed.   Damien JAYSON Braver, NP 07/05/2024, 4:09 PM [facility]

## 2024-07-06 ENCOUNTER — Ambulatory Visit: Attending: Cardiology | Admitting: Nurse Practitioner

## 2024-07-06 ENCOUNTER — Encounter: Payer: Self-pay | Admitting: Cardiovascular Disease

## 2024-07-06 ENCOUNTER — Telehealth: Payer: Self-pay

## 2024-07-06 DIAGNOSIS — Z01810 Encounter for preprocedural cardiovascular examination: Secondary | ICD-10-CM | POA: Diagnosis not present

## 2024-07-06 NOTE — Progress Notes (Signed)
 Virtual Visit via Telephone Note   Because of Benjamin Mckay [facility] co-morbid illnesses, he is at least at moderate risk for complications without adequate follow up.  This format is felt to be most appropriate for this patient at this time.  Due to technical limitations with video connection (technology), today's appointment will be conducted as an audio only telehealth visit, and Benjamin Mckay [facility] verbally agreed to proceed in this manner.   All issues noted in this document were discussed and addressed.  No physical exam could be performed with this format.  Evaluation Performed:  Preoperative cardiovascular risk assessment _____________   Date:  07/06/2024   Patient ID:  Benjamin Mckay, DOB Jul 13, 1946, MRN [id number] Patient Location:  Home Provider location:   Office  Primary Care Provider:  Jason Leita Repine, FNP Primary Cardiologist:  Benjamin Balding, MD  Chief Complaint / Patient Profile   78 y.o. y/o male with a h/o complete heart block/IVCD s/p PPM, paroxysmal atrial fibrillation, chronic combined systolic and diastolic heart failure, CAD, hypertension, hyperlipidemia, carotid artery stenosis, renal artery stenosis, CVA, and hypothyroidism who is pending laparoscopic ventral hernia repair with Dr. Lynda Leos of [facility] and presents today for telephonic preoperative cardiovascular risk assessment.  History of Present Illness    Benjamin Mckay is a 78 y.o. male who presents via audio/video conferencing for a telehealth visit today. Pt was last seen in cardiology clinic on 01/01/2024 by Benjamin Mckay. At that time Benjamin Mckay was doing well.  The patient is now pending procedure as outlined above. Since his last visit, he has done well from a cardiac standpoint.   He denies chest pain, palpitations, dyspnea, pnd, orthopnea, n, v, dizziness, syncope, edema, weight gain, or early satiety. All other systems reviewed and are otherwise negative except as noted above.    Past Medical History    Past Medical History:  Diagnosis Date   Atrial fibrillation (HCC)    Cardiomyopathy, ischemic 11/07/2013   Cataract    bil cataracts removed   CHB (complete heart block) (HCC) 11/06/2013   Chronic combined systolic and diastolic CHF (congestive heart failure) (HCC)    Clotting disorder    TIA , cva - PT ON COUMADINE   Colon polyps    Colon polyps    Congenital heart block    Constipation    CVA (cerebral infarction)    Diverticulosis    Esophageal stricture    Family history of adverse reaction to anesthesia    Heart murmur    Hyperlipidemia    Hypertension    Hyperthyroidism 11/16/2015   Lymphoma (HCC) 1998   of colon    Presence of permanent cardiac pacemaker    [employer]  pt.states he is totally dependent on pacemaker   Shortness of breath dyspnea    Pulmonary Effusion   Stroke (HCC)    TIA, cva   Umbilical hernia    Urinary frequency    Past Surgical History:  Procedure Laterality Date   APPENDECTOMY  1953   CARDIAC CATHETERIZATION  01/06/2003   Recommend medical therapy   CARDIOVASCULAR STRESS TEST  07/16/2012   Mild-moderate perfusion defect seen in Basal inferior, Mid inferior, and Apica lateral consistent with infarct/scar. No scintigraphic evidence for inducible myocardial ischemia. No ECG changes. EKG negative for ischemia.   CAROTID DOPPLER  11/04/2012   Proximal Rt ICA 50-99% diameter reduction; Lft Bulb demonstrated mild amount homogeneous plaque-not hemodynamically significant; Lft ICA-normal patency.   CHEST TUBE INSERTION Right 03/15/2016  Procedure: INSERTION PLEURAL DRAINAGE CATHETER;  Surgeon: Maude Fleeta Ochoa, MD;  Location: [facility] OR;  Service: Thoracic;  Laterality: Right;   COLECTOMY     for lymphoma   COLONOSCOPY     neck fusion     PACEMAKER GENERATOR CHANGE  01/31/2012   [employer] Jude Med Accent DR RF model [id number] serial [id number]   PACEMAKER PLACEMENT     replaced 3 x   PERMANENT PACEMAKER GENERATOR CHANGE N/A 01/31/2012    Procedure: PERMANENT PACEMAKER GENERATOR CHANGE;  Surgeon: Benjamin Balding, MD;  Location: [facility] CATH LAB;    PLEURADESIS N/A 04/02/2016   Procedure: ELNOR;  Surgeon: Maude Fleeta Ochoa, MD;  Location: [facility] OR;  Service: Thoracic;  Laterality: N/A;   PPM GENERATOR CHANGEOUT N/A 09/04/2023   Procedure: PPM GENERATOR CHANGEOUT;  Surgeon: Mckay Jerel, MD;  Location: [facility] INVASIVE CV LAB;  Service: Cardiovascular;  Laterality: N/A;   REMOVAL OF PLEURAL DRAINAGE CATHETER Right 07/26/2016   Procedure: REMOVAL OF PLEURAL DRAINAGE CATHETER;  Surgeon: Maude Fleeta Ochoa, MD;  Location: [facility] OR;  Service: Thoracic;  Laterality: Right;   TONSILLECTOMY     TRANSTHORACIC ECHOCARDIOGRAM  11/04/2012   EF 35-40%, systolic function moderately reduced, mild regurg of the aortic and mitral valves.   VIDEO ASSISTED THORACOSCOPY Right 04/02/2016   Procedure: Right VIDEO ASSISTED THORACOSCOPY with Biopsies and drainage pleural effusion;  Surgeon: Maude Fleeta Ochoa, MD;  Location: [facility] OR;  Service: Thoracic;  Laterality: Right;    Allergies  Allergies  Allergen Reactions   Statins Other (See Comments)    Leg cramps.   Tolerates pravastatin .     Adhesive [Tape] Other (See Comments)    Skin irritation - please use paper tape   Latex Other (See Comments)    Skin irritation   Penicillins Rash    Home Medications    Prior to Admission medications   Medication Sig Start Date End Date Taking? Authorizing Provider  acetaminophen  (TYLENOL ) 500 MG tablet Take 1,000 mg by mouth every 6 (six) hours as needed for moderate pain (pain score 4-6).    [provider]  doxycycline  (VIBRA -TABS) 100 MG tablet Take 1 tablet (100 mg total) by mouth 2 (two) times daily. 06/29/24   Benjamin Leita Repine, FNP  Evolocumab  (REPATHA  SURECLICK) 140 MG/ML SOAJ INJECT 140 MG (CONTENTS OF 1 INJECTOR) INTO THE SKIN EVERY 14 (FOURTEEN) DAYS 05/10/24   Benjamin Mckay, Jerel, MD  hydrocortisone  cream 1 % Apply 1 Application topically 2 (two) times  daily as needed for itching.    [provider]  JARDIANCE  10 MG TABS tablet TAKE 1 TABLET BY MOUTH DAILY BEFORE BREAKFAST. 05/26/24   Benjamin Mckay, Mihai, MD  metoprolol  succinate (TOPROL -XL) 25 MG 24 hr tablet TAKE 1 TABLET BY MOUTH TWICE A DAY 01/07/24   Benjamin Mckay, Mihai, MD  pravastatin  (PRAVACHOL ) 80 MG tablet TAKE 1 TABLET BY MOUTH EVERY DAY 01/07/24   Benjamin Mckay, Mihai, MD  sacubitril -valsartan  (ENTRESTO ) 24-26 MG TAKE 1 TABLET BY MOUTH TWICE A DAY 01/20/24   Benjamin Mckay, Mihai, MD  spironolactone  (ALDACTONE ) 25 MG tablet TAKE 1/2 TABLET (12.5 MG TOTAL) BY MOUTH EVERY OTHER DAY. 09/05/23   Benjamin Mckay, Mihai, MD  tamsulosin  (FLOMAX ) 0.4 MG CAPS capsule Take 0.4 mg by mouth at bedtime.    [provider]  traZODone  (DESYREL ) 50 MG tablet Take 1 tablet (50 mg total) by mouth at bedtime as needed for sleep. 02/24/24   Benjamin Leita Repine, FNP  warfarin (COUMADIN ) 2.5 MG tablet TAKE 2 TABLETS BY MOUTH DAILY EXCEPT 1.5  TABLETS ON TUESDAY, THURSDAY, AND SATURDAY OR AS DIRECTED BY THE ANTICOAGULATION CLINIC 06/03/24   Benjamin Mckay, Jerel, MD    Physical Exam    Vital Signs:  Benjamin Mckay [facility] does not have vital signs available for review today.  Given telephonic nature of communication, physical exam is limited. AAOx3. NAD. Normal affect.  Speech and respirations are unlabored.  Accessory Clinical Findings    None  Assessment & Plan    1.  Preoperative Cardiovascular Risk Assessment:  According to the Revised Cardiac Risk Index (RCRI), his Perioperative Risk of Major Cardiac Event is (%): 6.6. His Functional Capacity in METs is: 5.81 according to the Duke Activity Status Index (DASI). Therefore, based on ACC/AHA guidelines, patient would be at acceptable risk for the planned procedure without further cardiovascular testing.  The patient was advised that if he develops new symptoms prior to surgery to contact our office to arrange for a follow-up visit, and he verbalized understanding.  Per  office protocol, patient can hold warfarin for 5 days prior to procedure. Patient will not need bridging with Lovenox (enoxaparin) around procedure. Please resume warfarin as soon as possible postprocedure, at the discretion of the surgeon.   Patient does have a pacemaker.  I will send clearance request to our device clinic in case any additional recommendations are required regarding perioperative device management.  A copy of this note will be routed to requesting surgeon.  Time:   Today, I have spent 8 minutes with the patient with telehealth technology discussing medical history, symptoms, and management plan.     Benjamin JAYSON Braver, NP  07/06/2024, 11:12 AM

## 2024-07-06 NOTE — Telephone Encounter (Signed)
-----   Message from Damien JAYSON Braver sent at 07/06/2024 11:14 AM EDT ----- See attached request for surgical clearance.  Please route any additional recommendations regarding patient's device to the requesting party.  Thank you-EM

## 2024-07-06 NOTE — Telephone Encounter (Signed)
 Please refer to periop device clearance form in separate note.  Thanks.

## 2024-07-06 NOTE — Telephone Encounter (Signed)
 Pharmacy correction.  Patient on warfarin not Eliquis.  Okay to hold warfarin for 5 days.  Will not need Lovenox bridge.

## 2024-07-06 NOTE — Progress Notes (Signed)
 PERIOPERATIVE PRESCRIPTION FOR IMPLANTED CARDIAC DEVICE PROGRAMMING  Patient Information: Name:  Benjamin Mckay  DOB:  1946-03-17  MRN:  [id number]   Request for Surgical Clearance    Procedure:  Laparoscopic Ventral hernia   Date of Surgery:  Clearance TBD                                  Surgeon:  Lynda Nims, MD Surgeon's Group or Practice Name:  [facility] Phone number:  [phone] Fax number:  [phone]   Device Information:  Clinic EP Physician:  Jerel Balding, MD.  Device Type:  Pacemaker Manufacturer and Phone #:  St. Jude/[employer]: [phone] Pacemaker Dependent?:  Yes, intermittent Date of Last Device Check:  (01/01/24 in office, 06/03/24 remote) Normal Device Function?:  Yes.    Electrophysiologist's Recommendations:  Have magnet available. Provide continuous ECG monitoring when magnet is used or reprogramming is to be performed.  Procedure will likely interfere with device function.  Device should be programmed:  Asynchronous pacing during procedure and returned to normal programming after procedure  Per Device Clinic Standing Orders, Alan JAYSON Fees, RN  12:34 PM 07/06/2024

## 2024-07-07 NOTE — Progress Notes (Signed)
 Remote PPM Transmission

## 2024-07-14 ENCOUNTER — Ambulatory Visit: Attending: Cardiovascular Disease | Admitting: *Deleted

## 2024-07-14 DIAGNOSIS — I482 Chronic atrial fibrillation, unspecified: Secondary | ICD-10-CM

## 2024-07-14 DIAGNOSIS — Z7901 Long term (current) use of anticoagulants: Secondary | ICD-10-CM

## 2024-07-14 LAB — POCT INR: INR: 5.4 — AB (ref 2.0–3.0)

## 2024-07-14 NOTE — Patient Instructions (Addendum)
 Description   INR-5.4; Do not take any warfarin today and no warfarin tomorrow and no warfarin Friday then continue taking 2 tablets daily except 1.5 tablets on Tuesday, Thursday, and Saturday. Repeat INR in 1 week post procedure.  Anticoagulation Clinic [phone]     Last dose of warfarin for upcoming procedure on 07/21/24 and hold 5 days.  After you resume your warfarin take an  extra 1/2 tablet of warfarin for 2 days then resume normal dose. Please call if needed at [phone]

## 2024-07-14 NOTE — Progress Notes (Signed)
 Description   INR-5.4; Do not take any warfarin today and no warfarin tomorrow and no warfarin Friday then continue taking 2 tablets daily except 1.5 tablets on Tuesday, Thursday, and Saturday. Repeat INR in 1 week post procedure.  Anticoagulation Clinic [phone]

## 2024-07-19 ENCOUNTER — Encounter: Payer: Self-pay | Admitting: Family Medicine

## 2024-07-19 ENCOUNTER — Ambulatory Visit: Admitting: Family Medicine

## 2024-07-19 VITALS — BP 124/72 | HR 84 | Temp 97.7°F | Resp 16 | Ht 69.0 in | Wt 206.8 lb

## 2024-07-19 DIAGNOSIS — L814 Other melanin hyperpigmentation: Secondary | ICD-10-CM | POA: Diagnosis not present

## 2024-07-19 DIAGNOSIS — L821 Other seborrheic keratosis: Secondary | ICD-10-CM | POA: Diagnosis not present

## 2024-07-19 DIAGNOSIS — Z0000 Encounter for general adult medical examination without abnormal findings: Secondary | ICD-10-CM | POA: Diagnosis not present

## 2024-07-19 DIAGNOSIS — D582 Other hemoglobinopathies: Secondary | ICD-10-CM | POA: Diagnosis not present

## 2024-07-19 DIAGNOSIS — L02216 Cutaneous abscess of umbilicus: Secondary | ICD-10-CM | POA: Diagnosis not present

## 2024-07-19 DIAGNOSIS — I255 Ischemic cardiomyopathy: Secondary | ICD-10-CM | POA: Diagnosis not present

## 2024-07-19 DIAGNOSIS — L570 Actinic keratosis: Secondary | ICD-10-CM | POA: Diagnosis not present

## 2024-07-19 DIAGNOSIS — Z23 Encounter for immunization: Secondary | ICD-10-CM

## 2024-07-19 DIAGNOSIS — D1801 Hemangioma of skin and subcutaneous tissue: Secondary | ICD-10-CM | POA: Diagnosis not present

## 2024-07-19 MED ORDER — TRAZODONE HCL 50 MG PO TABS: 50.0000 mg | ORAL_TABLET | Freq: Every evening | ORAL | 1 refills | Status: AC | PRN

## 2024-07-19 NOTE — Patient Instructions (Addendum)
 Give us  2-3 business days to get the results of your labs back.   Keep the diet clean and stay active.  Please get me a copy of your advanced directive form at your convenience.   Please consider getting your tetanus booster at the pharmacy.  Please get me a copy of your advanced directive form at your convenience.   Let us  know if you need anything.

## 2024-07-19 NOTE — Progress Notes (Signed)
 Chief Complaint  Patient presents with   Establish Care    Establishing Care    Well Male Benjamin Mckay [name] is here for a complete physical.   His last physical was >1 year ago.  Current diet: in general, a healthy diet.   Current exercise: golfing Weight trend: stable Fatigue out of ordinary? No. Seat belt? Yes.   Advanced directive? Yes  Health maintenance Shingrix- No Tetanus- No Hep C- Yes Pneumonia vaccine- Yes  Past Medical History:  Diagnosis Date   Atrial fibrillation (HCC)    Cardiomyopathy, ischemic 11/07/2013   Cataract    bil cataracts removed   CHB (complete heart block) (HCC) 11/06/2013   Chronic combined systolic and diastolic CHF (congestive heart failure) (HCC)    Clotting disorder    TIA , cva - PT ON COUMADINE   Colon polyps    Colon polyps    Congenital heart block    Constipation    CVA (cerebral infarction)    Diverticulosis    Esophageal stricture    Family history of adverse reaction to anesthesia    Heart murmur    Hyperlipidemia    Hypertension    Hyperthyroidism 11/16/2015   Lymphoma (HCC) 1998   of colon    Presence of permanent cardiac pacemaker    St. Jude  pt.states he is totally dependent on pacemaker   Stroke (HCC)    TIA, cva   Umbilical hernia      Past Surgical History:  Procedure Laterality Date   APPENDECTOMY  1953   CARDIAC CATHETERIZATION  01/06/2003   Recommend medical therapy   CARDIOVASCULAR STRESS TEST  07/16/2012   Mild-moderate perfusion defect seen in Basal inferior, Mid inferior, and Apica lateral consistent with infarct/scar. No scintigraphic evidence for inducible myocardial ischemia. No ECG changes. EKG negative for ischemia.   CAROTID DOPPLER  11/04/2012   Proximal Rt ICA 50-99% diameter reduction; Lft Bulb demonstrated mild amount homogeneous plaque-not hemodynamically significant; Lft ICA-normal patency.   CHEST TUBE INSERTION Right 03/15/2016   Procedure: INSERTION PLEURAL DRAINAGE CATHETER;  Surgeon:  Maude Fleeta Ochoa, MD;  Location: [facility] OR;  Service: Thoracic;  Laterality: Right;   COLECTOMY     for lymphoma   COLONOSCOPY     neck fusion     PACEMAKER GENERATOR CHANGE  01/31/2012   St Jude Med Accent DR RF model [id number] serial [id number]   PACEMAKER PLACEMENT     replaced 3 x   PERMANENT PACEMAKER GENERATOR CHANGE N/A 01/31/2012   Procedure: PERMANENT PACEMAKER GENERATOR CHANGE;  Surgeon: Jerel Balding, MD;  Location: [facility] CATH LAB;    PLEURADESIS N/A 04/02/2016   Procedure: ELNOR;  Surgeon: Maude Fleeta Ochoa, MD;  Location: [facility] OR;  Service: Thoracic;  Laterality: N/A;   PPM GENERATOR CHANGEOUT N/A 09/04/2023   Procedure: PPM GENERATOR CHANGEOUT;  Surgeon: Balding Jerel, MD;  Location: [facility] INVASIVE CV LAB;  Service: Cardiovascular;  Laterality: N/A;   REMOVAL OF PLEURAL DRAINAGE CATHETER Right 07/26/2016   Procedure: REMOVAL OF PLEURAL DRAINAGE CATHETER;  Surgeon: Maude Fleeta Ochoa, MD;  Location: [facility] OR;  Service: Thoracic;  Laterality: Right;   TONSILLECTOMY     TRANSTHORACIC ECHOCARDIOGRAM  11/04/2012   EF 35-40%, systolic function moderately reduced, mild regurg of the aortic and mitral valves.   VIDEO ASSISTED THORACOSCOPY Right 04/02/2016   Procedure: Right VIDEO ASSISTED THORACOSCOPY with Biopsies and drainage pleural effusion;  Surgeon: Maude Fleeta Ochoa, MD;  Location: [facility] OR;  Service: Thoracic;  Laterality: Right;  Medications  Current Outpatient Medications on File Prior to Visit  Medication Sig Dispense Refill   acetaminophen  (TYLENOL ) 500 MG tablet Take 1,000 mg by mouth every 6 (six) hours as needed for moderate pain (pain score 4-6).     Evolocumab  (REPATHA  SURECLICK) 140 MG/ML SOAJ INJECT 140 MG (CONTENTS OF 1 INJECTOR) INTO THE SKIN EVERY 14 (FOURTEEN) DAYS 6 mL 3   hydrocortisone  cream 1 % Apply 1 Application topically 2 (two) times daily as needed for itching.     JARDIANCE  10 MG TABS tablet TAKE 1 TABLET BY MOUTH DAILY BEFORE BREAKFAST. 30 tablet 11   metoprolol   succinate (TOPROL -XL) 25 MG 24 hr tablet TAKE 1 TABLET BY MOUTH TWICE A DAY 180 tablet 3   pravastatin  (PRAVACHOL ) 80 MG tablet TAKE 1 TABLET BY MOUTH EVERY DAY 90 tablet 3   sacubitril -valsartan  (ENTRESTO ) 24-26 MG TAKE 1 TABLET BY MOUTH TWICE A DAY 180 tablet 3   spironolactone  (ALDACTONE ) 25 MG tablet TAKE 1/2 TABLET (12.5 MG TOTAL) BY MOUTH EVERY OTHER DAY. 45 tablet 3   tamsulosin  (FLOMAX ) 0.4 MG CAPS capsule Take 0.4 mg by mouth at bedtime.     warfarin (COUMADIN ) 2.5 MG tablet TAKE 2 TABLETS BY MOUTH DAILY EXCEPT 1.5 TABLETS ON TUESDAY, THURSDAY, AND SATURDAY OR AS DIRECTED BY THE ANTICOAGULATION CLINIC 170 tablet 1   No current facility-administered medications on file prior to visit.     Allergies Allergies  Allergen Reactions   Statins Other (See Comments)    Leg cramps.   Tolerates pravastatin .     Adhesive [Tape] Other (See Comments)    Skin irritation - please use paper tape   Latex Other (See Comments)    Skin irritation   Penicillins Rash    Family History Family History  Problem Relation Age of Onset   Prostate cancer Father    Heart disease Mother    Stroke Mother    Diabetes Maternal Grandmother    Colon cancer Neg Hx    Heart attack Neg Hx    Esophageal cancer Neg Hx    Stomach cancer Neg Hx    Rectal cancer Neg Hx     Review of Systems: Constitutional:  no fevers Eye:  no recent significant change in vision Ears:  No changes in hearing Nose/Mouth/Throat:  no complaints of nasal congestion, no sore throat Cardiovascular: no chest pain Respiratory:  No shortness of breath Gastrointestinal:  No change in bowel habits GU:  No frequency Integumentary:  no abnormal skin lesions reported Neurologic:  no headaches Endocrine:  denies unexplained weight changes  Exam BP 124/72 (BP Location: Left Arm, Patient Position: Sitting)   Pulse 84   Temp 97.7 F (36.5 C) (Oral)   Resp 16   Ht 5' 9 (1.753 m)   Wt 206 lb 12.8 oz (93.8 kg)   SpO2 95%   BMI  30.54 kg/m  General:  well developed, well nourished, in no apparent distress Skin:  no significant moles, warts, or growths Head:  no masses, lesions, or tenderness Eyes:  pupils equal and round, sclera anicteric without injection Ears:  canals without lesions, TMs shiny without retraction, no obvious effusion, no erythema Nose:  nares patent, mucosa normal Throat/Pharynx:  lips and gingiva without lesion; tongue and uvula midline; non-inflamed pharynx; no exudates or postnasal drainage Lungs:  clear to auscultation, breath sounds equal bilaterally, no respiratory distress Cardio:  regular rate and rhythm, no LE edema or bruits Rectal: Deferred GI: BS+, S, NT, ND, no masses  or organomegaly Musculoskeletal:  symmetrical muscle groups noted without atrophy or deformity Neuro:  gait normal; deep tendon reflexes normal and symmetric Psych: well oriented with normal range of affect and appropriate judgment/insight  Assessment and Plan  Well adult exam  Elevated hemoglobin - Plan: Erythropoietin  Cardiomyopathy, ischemic - Plan: CBC, Lipid panel   Well 78 y.o. male. Counseled on diet and exercise. Advanced directive form provided today.  Other orders as above. Tetanus booster and Shingrix rec'd to get at pharmacy.  Flu shot today.  F/u on elevated Hb.  Follow up in 6 mo.  The patient voiced understanding and agreement to the plan.  Mabel [place], DO 07/19/24 2:58 PM

## 2024-07-19 NOTE — Addendum Note (Signed)
 Addended by: Kendan Cornforth M on: 07/19/2024 03:02 PM   Modules accepted: Orders

## 2024-07-20 ENCOUNTER — Ambulatory Visit: Payer: Self-pay | Admitting: Family Medicine

## 2024-07-20 LAB — LIPID PANEL
Cholesterol: 121 mg/dL (ref 0–200)
HDL: 40.1 mg/dL (ref >39.00)
LDL Cholesterol: 42 mg/dL (ref 0–99)
NonHDL: 81.09
Total CHOL/HDL Ratio: 3
Triglycerides: 194 mg/dL — ABNORMAL HIGH (ref 0.0–149.0)
VLDL: 38.8 mg/dL (ref 0.0–40.0)

## 2024-07-20 LAB — CBC
HCT: 47.3 % (ref 39.0–52.0)
Hemoglobin: 16 g/dL (ref 13.0–17.0)
MCHC: 33.8 g/dL (ref 30.0–36.0)
MCV: 97.7 fl (ref 78.0–100.0)
Platelets: 212 K/uL (ref 150.0–400.0)
RBC: 4.84 Mil/uL (ref 4.22–5.81)
RDW: 14.1 % (ref 11.5–15.5)
WBC: 7.5 K/uL (ref 4.0–10.5)

## 2024-07-21 LAB — ERYTHROPOIETIN: Erythropoietin: 13.9 m[IU]/mL (ref 2.6–18.5)

## 2024-07-26 ENCOUNTER — Encounter: Payer: Self-pay | Admitting: Cardiovascular Disease

## 2024-07-26 ENCOUNTER — Encounter (HOSPITAL_COMMUNITY): Payer: Self-pay | Admitting: General Surgery

## 2024-07-26 ENCOUNTER — Other Ambulatory Visit: Payer: Self-pay

## 2024-07-26 NOTE — Progress Notes (Signed)
 SDW CALL  Patient was given pre-op instructions over the phone. The opportunity was given for the patient to ask questions. No further questions asked. Patient verbalized understanding of instructions given.   PCP - Mabel Pry  (LOV - 10/6) Cardiologist - Dr. Jerel Croitoru (clearance 07/06/24)  PPM/ICD - [employer]. Jude Pacemaker Device Orders - requested and received Rep Notified - paged rep at 0815 on 07/26/24  Chest x-ray - denies EKG - 01/06/24 Stress Test -  ECHO - 04/03/23 Cardiac Cath - 2004  Sleep Study - denies  No DM  Last dose of GLP1 agonist-  n/a GLP1 instructions: n/a  Last dose of Jardiance  - 07/25/24 - instructed not to take on 10/13 or 10/14  Blood Thinner Instructions: last dose of Warfarin 07/21/24 Aspirin  Instructions: n/a  ERAS Protcol - clears until 0830 PRE-SURGERY Ensure or G2- n/a  COVID TEST- n/a   Anesthesia review: sent on 10/13 - A. Fib; CHF, heart murmur, HTN, TIA; Pacemaker  Patient denies shortness of breath, fever, cough and chest pain over the phone call   All instructions explained to the patient, with a verbal understanding of the material. Patient agrees to go over the instructions while at home for a better understanding.

## 2024-07-26 NOTE — Anesthesia Preprocedure Evaluation (Signed)
 Anesthesia Evaluation    Airway        Dental   Pulmonary           Cardiovascular hypertension,      Neuro/Psych    GI/Hepatic   Endo/Other    Renal/GU      Musculoskeletal   Abdominal   Peds  Hematology   Anesthesia Other Findings   Reproductive/Obstetrics                              Anesthesia Physical Anesthesia Plan  ASA:   Anesthesia Plan:    Post-op Pain Management:    Induction:   PONV Risk Score and Plan:   Airway Management Planned:   Additional Equipment:   Intra-op Plan:   Post-operative Plan:   Informed Consent:   Plan Discussed with:   Anesthesia Plan Comments: (PAT note by Lynwood Hope, PA-C:  78 year old male follows the cardiology for history of intermittent CHB s/p Saint Jude/Abbott PPM, pAF on warfarin, SVT, combined heart failure, CAD, HTN, HLD, carotid stenosis ultrasound 01/2016 showed R ICA 60 to 79% and LICA 40 to 59%; patient declined serial monitoring), renal artery stenosis, CVA.  Cardiac PET/CT 05/2023 showed severe fixed basal to apical inferior perfusion defect with hypokinesis consistent with infarct, no ischemia, EF 36%.  Seen by Damien Braver, NP on 07/06/2024 for preop evaluation.  Per note, According to the Revised Cardiac Risk Index (RCRI), his Perioperative Risk of Major Cardiac Event is (%): 6.6. His Functional Capacity in METs is: 5.81 according to the Duke Activity Status Index (DASI). Therefore, based on ACC/AHA guidelines, patient would be at acceptable risk for the planned procedure without further cardiovascular testing. The patient was advised that if he develops new symptoms prior to surgery to contact our office to arrange for a follow-up visit, and he verbalized understanding. Per office protocol, patient can hold warfarin for 5 days prior to procedure. Patient will not need bridging with Lovenox (enoxaparin) around procedure. Please resume  warfarin as soon as possible postprocedure, at the discretion of the surgeon.  Patient reports last dose of warfarin 07/21/2024.  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  BMP 06/29/2024 reviewed, unremarkable.  CBC 07/19/2024 reviewed, WNL.  EKG 04/24/2023: Ventricular paced rhythm.  Rate 68.  Perioperative prescription for implanted cardiac device programming per progress note 07/26/2024: Device Information:   Clinic EP Physician:  Dr. Madelyne Croitoru    Device Type:  Pacemaker Manufacturer and Phone #:  St. Jude/Abbott: [phone] Pacemaker Dependent?:  No. Date of Last Device Check:  07/20/2024      Normal Device Function?:  Yes.     Electrophysiologist's Recommendations:    Have magnet available.  Provide continuous ECG monitoring when magnet is used or reprogramming is to be performed.   Procedure may interfere with device function.  Magnet should be placed over device during procedure.  Cardiac PET/CT 06/12/2023:    Severe fixed basal to apical inferior perfusion defect with hypokinesis in this region, consistent with infarct.  No ischemia.  No high risk findings such as TID or drop in EF with stress (EF 36% at rest, 40% at stress), and myocardial blood flow reserve is normal.  Overall, intermediate risk study due to systolic dysfunction.   EKG not recorded during study   LV perfusion is abnormal. There is no evidence of ischemia. There is evidence of infarction.  Defect 1: There is a medium defect with severe reduction in uptake present in the apical to basal inferior location(s) that is fixed. There is abnormal wall motion in the defect area. Consistent with infarction. The defect is consistent with abnormal perfusion in the RCA territory.   Rest left ventricular function is abnormal. Rest EF: 36%. Stress left ventricular function is abnormal. Stress EF: 40%. End  diastolic cavity size is normal. End systolic cavity size is normal.   Myocardial blood flow was computed to be 0.55ml/g/min at rest and 1.47ml/g/min at stress. Global myocardial blood flow reserve was 2.67 and was normal.   Coronary calcium was present on the attenuation correction CT images. Moderate coronary calcifications were present. Coronary calcifications were present in the left anterior descending artery, left circumflex artery and right coronary artery distribution(s).   Findings are consistent with infarction. The study is intermediate risk.   Electronically signed by Lonni Nanas, MD   TTE 04/03/2023: 1. Left ventricular ejection fraction, by estimation, is 30 to 35%. The  left ventricle has moderately decreased function. The left ventricle  demonstrates regional wall motion abnormalities (see scoring  diagram/findings for description). There is mild  concentric left ventricular hypertrophy. Left ventricular diastolic  function could not be evaluated. There is severe hypokinesis of the left  ventricular, basal-mid inferoseptal wall. There is moderate hypokinesis of  the left ventricular, basal-mid  inferior wall.   2. Right ventricular systolic function is moderately reduced. The right  ventricular size is mildly enlarged. There is normal pulmonary artery  systolic pressure.   3. Left atrial size was moderately dilated.   4. The mitral valve is normal in structure. Trivial mitral valve  regurgitation. No evidence of mitral stenosis.   5. The aortic valve is tricuspid. There is mild calcification of the  aortic valve. There is mild thickening of the aortic valve. Aortic valve  regurgitation is mild. Aortic valve sclerosis is present, with no evidence  of aortic valve stenosis.   6. Aortic dilatation noted. There is borderline dilatation of the  ascending aorta, measuring 38 mm.   7. The inferior vena cava is normal in size with greater than 50%  respiratory  variability, suggesting right atrial pressure of 3 mmHg.   Comparison(s): Changes from prior study are noted. LV with global  hypokinesis as well as focal wall motion abnormalities in the inferior and  inferoseptal walls. EF 30-35%.   )         Anesthesia Quick Evaluation

## 2024-07-26 NOTE — Progress Notes (Signed)
 PERIOPERATIVE PRESCRIPTION FOR IMPLANTED CARDIAC DEVICE PROGRAMMING  Patient Information: Name:  Benjamin Mckay  DOB:  1946/02/07  MRN:  [id number]    Planned Procedure:  Laparoscopic Ventral Hernia Repair with mesh  Surgeon:  Dr. Lynda Leos  Date of Procedure:  07/27/24  Cautery will be used.  Position during surgery:  supine   Please send documentation back to:  [facility] (Fax # [phone])  Device Information:  Clinic EP Physician:  Dr. Madelyne Croitoru   Device Type:  Pacemaker Manufacturer and Phone #:  St. Jude/Abbott: [phone] Pacemaker Dependent?:  No. Date of Last Device Check:  07/20/2024  Normal Device Function?:  Yes.    Electrophysiologist's Recommendations:  Have magnet available. Provide continuous ECG monitoring when magnet is used or reprogramming is to be performed.  Procedure may interfere with device function.  Magnet should be placed over device during procedure.  Per Device Clinic Standing Orders, Almarie ONEIDA Shutter, RN  10:02 AM 07/26/2024

## 2024-07-26 NOTE — Progress Notes (Addendum)
 Patient called and stated that is coughing and has a temperature of 100.5.  Patient states that he will call the surgeon's office and I have also notified the surgeon's office.    Notified [employer]. [employer] that case has been canceled.

## 2024-07-26 NOTE — Progress Notes (Signed)
 Anesthesia Chart Review: Same day workup  78 year old male follows the cardiology for history of intermittent CHB s/p Saint Jude/Abbott PPM, pAF on warfarin, SVT, combined heart failure, CAD, HTN, HLD, carotid stenosis ultrasound 01/2016 showed R ICA 60 to 79% and LICA 40 to 59%; patient declined serial monitoring), renal artery stenosis, CVA.  Cardiac PET/CT 05/2023 showed severe fixed basal to apical inferior perfusion defect with hypokinesis consistent with infarct, no ischemia, EF 36%.  Seen by Damien Braver, NP on 07/06/2024 for preop evaluation.  Per note, According to the Revised Cardiac Risk Index (RCRI), his Perioperative Risk of Major Cardiac Event is (%): 6.6. His Functional Capacity in METs is: 5.81 according to the Duke Activity Status Index (DASI). Therefore, based on ACC/AHA guidelines, patient would be at acceptable risk for the planned procedure without further cardiovascular testing. The patient was advised that if he develops new symptoms prior to surgery to contact our office to arrange for a follow-up visit, and he verbalized understanding. Per office protocol, patient can hold warfarin for 5 days prior to procedure. Patient will not need bridging with Lovenox (enoxaparin) around procedure. Please resume warfarin as soon as possible postprocedure, at the discretion of the surgeon.  Patient reports last dose of warfarin 07/21/2024.  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  BMP 06/29/2024 reviewed, unremarkable.  CBC 07/19/2024 reviewed, WNL.  EKG 04/24/2023: Ventricular paced rhythm.  Rate 68.  Perioperative prescription for implanted cardiac device programming per progress note 07/26/2024: Device Information:   Clinic EP Physician:  Dr. Madelyne Croitoru    Device Type:  Pacemaker Manufacturer and Phone #:  St. Jude/Abbott: [phone] Pacemaker Dependent?:  No. Date of  Last Device Check:  07/20/2024      Normal Device Function?:  Yes.     Electrophysiologist's Recommendations:   Have magnet available. Provide continuous ECG monitoring when magnet is used or reprogramming is to be performed.  Procedure may interfere with device function.  Magnet should be placed over device during procedure.  Cardiac PET/CT 06/12/2023:    Severe fixed basal to apical inferior perfusion defect with hypokinesis in this region, consistent with infarct.  No ischemia.  No high risk findings such as TID or drop in EF with stress (EF 36% at rest, 40% at stress), and myocardial blood flow reserve is normal.  Overall, intermediate risk study due to systolic dysfunction.   EKG not recorded during study   LV perfusion is abnormal. There is no evidence of ischemia. There is evidence of infarction. Defect 1: There is a medium defect with severe reduction in uptake present in the apical to basal inferior location(s) that is fixed. There is abnormal wall motion in the defect area. Consistent with infarction. The defect is consistent with abnormal perfusion in the RCA territory.   Rest left ventricular function is abnormal. Rest EF: 36%. Stress left ventricular function is abnormal. Stress EF: 40%. End diastolic cavity size is normal. End systolic cavity size is normal.   Myocardial blood flow was computed to be 0.55ml/g/min at rest and 1.47ml/g/min at stress. Global myocardial blood flow reserve was 2.67 and was normal.   Coronary calcium was present on the attenuation correction CT images. Moderate coronary calcifications were present. Coronary calcifications were present in the left anterior descending artery, left circumflex artery and right coronary artery distribution(s).   Findings are consistent with infarction. The study is intermediate risk.   Electronically signed by Lonni Nanas,  MD   TTE 04/03/2023: 1. Left ventricular ejection fraction, by estimation, is 30 to 35%. The   left ventricle has moderately decreased function. The left ventricle  demonstrates regional wall motion abnormalities (see scoring  diagram/findings for description). There is mild  concentric left ventricular hypertrophy. Left ventricular diastolic  function could not be evaluated. There is severe hypokinesis of the left  ventricular, basal-mid inferoseptal wall. There is moderate hypokinesis of  the left ventricular, basal-mid  inferior wall.   2. Right ventricular systolic function is moderately reduced. The right  ventricular size is mildly enlarged. There is normal pulmonary artery  systolic pressure.   3. Left atrial size was moderately dilated.   4. The mitral valve is normal in structure. Trivial mitral valve  regurgitation. No evidence of mitral stenosis.   5. The aortic valve is tricuspid. There is mild calcification of the  aortic valve. There is mild thickening of the aortic valve. Aortic valve  regurgitation is mild. Aortic valve sclerosis is present, with no evidence  of aortic valve stenosis.   6. Aortic dilatation noted. There is borderline dilatation of the  ascending aorta, measuring 38 mm.   7. The inferior vena cava is normal in size with greater than 50%  respiratory variability, suggesting right atrial pressure of 3 mmHg.   Comparison(s): Changes from prior study are noted. LV with global  hypokinesis as well as focal wall motion abnormalities in the inferior and  inferoseptal walls. EF 30-35%.       Lynwood Geofm RIGGERS [facility] Short Stay Center/Anesthesiology Phone [phone] 07/26/2024 10:59 AM

## 2024-07-27 ENCOUNTER — Ambulatory Visit (HOSPITAL_COMMUNITY): Admission: RE | Admit: 2024-07-27 | Admitting: General Surgery

## 2024-07-27 ENCOUNTER — Encounter (HOSPITAL_COMMUNITY): Payer: Self-pay | Admitting: Physician Assistant

## 2024-07-27 SURGERY — REPAIR, HERNIA, VENTRAL, LAPAROSCOPIC
Anesthesia: General

## 2024-07-28 ENCOUNTER — Ambulatory Visit: Admitting: Family Medicine

## 2024-07-28 ENCOUNTER — Encounter: Payer: Self-pay | Admitting: Family Medicine

## 2024-07-28 VITALS — BP 122/76 | HR 89 | Temp 98.0°F | Resp 16 | Ht 69.0 in | Wt 207.4 lb

## 2024-07-28 DIAGNOSIS — J069 Acute upper respiratory infection, unspecified: Secondary | ICD-10-CM

## 2024-07-28 NOTE — Patient Instructions (Signed)
 Continue to push fluids, practice good hand hygiene, and cover your mouth if you cough. ? ?If you start having fevers, shaking or shortness of breath, seek immediate care. ? ?OK to take Tylenol 1000 mg (2 extra strength tabs) or 975 mg (3 regular strength tabs) every 6 hours as needed. ? ?Let us know if you need anything. ?

## 2024-07-28 NOTE — Progress Notes (Signed)
 Chief Complaint  Patient presents with   Cough    Cough and Fever    [facility] here for URI complaints.  Duration: 4 days  Associated symptoms: coughing, some SOB, sinus pressure Denies: sinus congestion, rhinorrhea, itchy watery eyes, ear pain, ear drainage, sore throat, wheezing, myalgia, and lingering fevers Treatment to date: Dayquil, Tylenol , Delsym Sick contacts: No  Past Medical History:  Diagnosis Date   Atrial fibrillation (HCC)    Cardiomyopathy, ischemic 11/07/2013   Cataract    bil cataracts removed   CHB (complete heart block) (HCC) 11/06/2013   Chronic combined systolic and diastolic CHF (congestive heart failure) (HCC)    Clotting disorder    TIA , cva - PT ON COUMADINE   Colon polyps    Colon polyps    Congenital heart block    Constipation    CVA (cerebral infarction)    Diverticulosis    Esophageal stricture    Family history of adverse reaction to anesthesia    Heart murmur    Hyperlipidemia    Hypertension    Hyperthyroidism 11/16/2015   Lymphoma (HCC) 1998   of colon    Presence of permanent cardiac pacemaker    St. Jude  pt.states he is totally dependent on pacemaker   Stroke (HCC)    TIA, cva   Umbilical hernia     Objective BP 122/76 (BP Location: Left Arm, Patient Position: Sitting)   Pulse 89   Temp 98 F (36.7 C) (Oral)   Resp 16   Ht 5' 9 (1.753 m)   Wt 207 lb 6.4 oz (94.1 kg)   SpO2 98%   BMI 30.63 kg/m  General: Awake, alert, appears stated age HEENT: AT, [place], ears patent b/l and TM's neg, nares patent w/o discharge, pharynx pink and without exudates, MMM, no sinus ttp Neck: No masses or asymmetry Heart: RRR Lungs: CTAB, no accessory muscle use Psych: Age appropriate judgment and insight, normal mood and affect  Viral URI with cough  Continue to push fluids, practice good hand hygiene, cover mouth when coughing. OK to take Tylenol /Delsym.  F/u prn. If starting to experience fevers, shaking, or shortness of breath,  seek immediate care. Pt voiced understanding and agreement to the plan.  Mabel [place], DO 07/28/24 3:12 PM

## 2024-08-04 ENCOUNTER — Ambulatory Visit: Attending: Cardiovascular Disease | Admitting: *Deleted

## 2024-08-04 DIAGNOSIS — I482 Chronic atrial fibrillation, unspecified: Secondary | ICD-10-CM

## 2024-08-04 DIAGNOSIS — Z7901 Long term (current) use of anticoagulants: Secondary | ICD-10-CM | POA: Diagnosis not present

## 2024-08-04 LAB — POCT INR: INR: 2 (ref 2.0–3.0)

## 2024-08-04 NOTE — Progress Notes (Signed)
 Description   INR-2.0; Today take 2.5 tablets of warfarin then continue taking 2 tablets daily except 1.5 tablets on Tuesday, Thursday, and Saturday. Repeat INR in 3 weeks.  Anticoagulation Clinic [phone]

## 2024-08-04 NOTE — Patient Instructions (Signed)
 Description   INR-2.0; Today take 2.5 tablets of warfarin then continue taking 2 tablets daily except 1.5 tablets on Tuesday, Thursday, and Saturday. Repeat INR in 3 weeks.  Anticoagulation Clinic [phone]

## 2024-08-24 NOTE — Progress Notes (Signed)
 Redell Regal, [employer] Jude rep, notified of patients surgery date and time. Rep states that he will be at hospital morning of surgery to evaluate device.

## 2024-08-25 ENCOUNTER — Encounter (HOSPITAL_COMMUNITY): Payer: Self-pay

## 2024-08-25 ENCOUNTER — Other Ambulatory Visit: Payer: Self-pay

## 2024-08-25 ENCOUNTER — Ambulatory Visit: Attending: Cardiovascular Disease | Admitting: Pharmacist

## 2024-08-25 ENCOUNTER — Encounter (HOSPITAL_COMMUNITY)
Admission: RE | Admit: 2024-08-25 | Discharge: 2024-08-25 | Disposition: A | Discharge status: Home / Self Care | Source: Ambulatory Visit | Attending: General Surgery | Admitting: General Surgery

## 2024-08-25 VITALS — BP 133/69 | HR 76 | Temp 98.0°F | Resp 17 | Ht 69.0 in | Wt 208.0 lb

## 2024-08-25 DIAGNOSIS — Z7901 Long term (current) use of anticoagulants: Secondary | ICD-10-CM | POA: Diagnosis not present

## 2024-08-25 DIAGNOSIS — I482 Chronic atrial fibrillation, unspecified: Secondary | ICD-10-CM

## 2024-08-25 DIAGNOSIS — Z01812 Encounter for preprocedural laboratory examination: Secondary | ICD-10-CM | POA: Diagnosis present

## 2024-08-25 DIAGNOSIS — Z01810 Encounter for preprocedural cardiovascular examination: Secondary | ICD-10-CM | POA: Diagnosis present

## 2024-08-25 DIAGNOSIS — Z01818 Encounter for other preprocedural examination: Secondary | ICD-10-CM | POA: Insufficient documentation

## 2024-08-25 LAB — BASIC METABOLIC PANEL WITH GFR
Anion gap: 10 (ref 5–15)
BUN: 10 mg/dL (ref 8–23)
CO2: 26 mmol/L (ref 22–32)
Calcium: 8.8 mg/dL — ABNORMAL LOW (ref 8.9–10.3)
Chloride: 102 mmol/L (ref 98–111)
Creatinine, Ser: 1.07 mg/dL (ref 0.61–1.24)
GFR, Estimated: >60 mL/min (ref >60)
Glucose, Bld: 94 mg/dL (ref 70–99)
Potassium: 4.1 mmol/L (ref 3.5–5.1)
Sodium: 138 mmol/L (ref 135–145)

## 2024-08-25 LAB — CBC
HCT: 47.6 % (ref 39.0–52.0)
Hemoglobin: 16 g/dL (ref 13.0–17.0)
MCH: 32.5 pg (ref 26.0–34.0)
MCHC: 33.6 g/dL (ref 30.0–36.0)
MCV: 96.7 fL (ref 80.0–100.0)
Platelets: 145 K/uL — ABNORMAL LOW (ref 150–400)
RBC: 4.92 MIL/uL (ref 4.22–5.81)
RDW: 12.8 % (ref 11.5–15.5)
WBC: 5.9 K/uL (ref 4.0–10.5)
nRBC: 0 % (ref 0.0–0.2)

## 2024-08-25 LAB — POCT INR: INR: 2.4 (ref 2.0–3.0)

## 2024-08-25 NOTE — Progress Notes (Signed)
 Description   INR-2.4; Begin holding warfarin for 5 days. Take 2 tablets on 11/18 then resume previous dose of 2 tablets daily except 1.5 tablets on Tuesday, Thursday, and Saturday. Repeat INR in 3 weeks.  Anticoagulation Clinic [phone]

## 2024-08-25 NOTE — Pre-Procedure Instructions (Signed)
 Surgical Instructions   Your procedure is scheduled on Tuesday, November 18th. Report to [facility] Main Entrance A at 09:20 A.M., then check in with the Admitting office. Any questions or running late day of surgery: call [phone]  Questions prior to your surgery date: call [phone], Monday-Friday, 8am-4pm. If you experience any cold or flu symptoms such as cough, fever, chills, shortness of breath, etc. between now and your scheduled surgery, please notify us  at the above number.     Remember:  Do not eat after midnight the night before your surgery   You may drink clear liquids until 08:20 AM the morning of your surgery.   Clear liquids allowed are: Water, Non-Citrus Juices (without pulp), Carbonated Beverages, Clear Tea (no milk, honey, etc.), Black Coffee Only (NO MILK, CREAM OR POWDERED CREAMER of any kind), and Gatorade.    Take these medicines the morning of surgery with A SIP OF WATER  JARDIANCE   metoprolol  succinate (TOPROL -XL)  pravastatin  (PRAVACHOL )    May take these medicines IF NEEDED: acetaminophen  (TYLENOL )    STOP taking Warfarin 5 days prior to surgery. Last dose 11/12.  One week prior to surgery, STOP taking any Aspirin  (unless otherwise instructed by your surgeon) Aleve, Naproxen, Ibuprofen, Motrin, Advil, Goody's, BC's, all herbal medications, fish oil, and non-prescription vitamins.                     Do NOT Smoke (Tobacco/Vaping) for 24 hours prior to your procedure.  If you use a CPAP at night, you may bring your mask/headgear for your overnight stay.   You will be asked to remove any contacts, glasses, piercing's, hearing aid's, dentures/partials prior to surgery. Please bring cases for these items if needed.    Patients discharged the day of surgery will not be allowed to drive home, and someone needs to stay with them for 24 hours.  SURGICAL WAITING ROOM VISITATION Patients may have no more than 2 support people in the waiting area -  these visitors may rotate.   Pre-op nurse will coordinate an appropriate time for 1 ADULT support person, who may not rotate, to accompany patient in pre-op.  Children under the age of 50 must have an adult with them who is not the patient and must remain in the main waiting area with an adult.  If the patient needs to stay at the hospital during part of their recovery, the visitor guidelines for inpatient rooms apply.  Please refer to the [facility] website for the visitor guidelines for any additional information.   If you received a COVID test during your pre-op visit  it is requested that you wear a mask when out in public, stay away from anyone that may not be feeling well and notify your surgeon if you develop symptoms. If you have been in contact with anyone that has tested positive in the last 10 days please notify you surgeon.      Pre-operative CHG Bathing Instructions   You can play a key role in reducing the risk of infection after surgery. Your skin needs to be as free of germs as possible. You can reduce the number of germs on your skin by washing with CHG (chlorhexidine  gluconate) soap before surgery. CHG is an antiseptic soap that kills germs and continues to kill germs even after washing.   DO NOT use if you have an allergy to chlorhexidine /CHG or antibacterial soaps. If your skin becomes reddened or irritated, stop using the CHG and  notify one of our RNs at [phone].              TAKE A SHOWER THE NIGHT BEFORE SURGERY   Please keep in mind the following:  DO NOT shave, including legs and underarms, 48 hours prior to surgery.   You may shave your face before/day of surgery.  Place clean sheets on your bed the night before surgery Use a clean washcloth (not used since being washed) for shower. DO NOT sleep with pet's night before surgery.  CHG Shower Instructions:  Wash your face and private area with normal soap. If you choose to wash your hair, wash first with  your normal shampoo.  After you use shampoo/soap, rinse your hair and body thoroughly to remove shampoo/soap residue.  Turn the water OFF and apply half the bottle of CHG soap to a CLEAN washcloth.  Apply CHG soap ONLY FROM YOUR NECK DOWN TO YOUR TOES (washing for 3-5 minutes)  DO NOT use CHG soap on face, private areas, open wounds, or sores.  Pay special attention to the area where your surgery is being performed.  If you are having back surgery, having someone wash your back for you may be helpful. Wait 2 minutes after CHG soap is applied, then you may rinse off the CHG soap.  Pat dry with a clean towel  Put on clean pajamas    Additional instructions for the day of surgery: If you choose, you may shower the morning of surgery with an antibacterial soap.  DO NOT APPLY any lotions, deodorants, cologne, or perfumes.   Do not wear jewelry or makeup Do not wear nail polish, gel polish, artificial nails, or any other type of covering on natural nails (fingers and toes) Do not bring valuables to the hospital. [facility] is not responsible for valuables/personal belongings. Put on clean/comfortable clothes.  Please brush your teeth.  Ask your nurse before applying any prescription medications to the skin.

## 2024-08-25 NOTE — Patient Instructions (Signed)
 Description   INR-2.4; Begin holding warfarin for 5 days. Take 2 tablets on 11/18 then resume previous dose of 2 tablets daily except 1.5 tablets on Tuesday, Thursday, and Saturday. Repeat INR in 3 weeks.  Anticoagulation Clinic [phone]

## 2024-08-25 NOTE — Progress Notes (Addendum)
 PCP - Dr. Mabel Pry Cardiologist - Dr. Jerel Croitoru  PPM/ICD - denies   Chest x-ray - 01/14/22 EKG - 08/25/24 Stress Test - 06/12/23 ECHO - 04/03/23 Cardiac Cath - denies  Sleep Study - denies   DM- denies  Last dose of GLP1 agonist-  n/a   Blood Thinner Instructions: Hold Warfarin 5 days. Last dose 11/12 Aspirin  Instructions: n/a  ERAS Protcol - clears until 0820   COVID TEST- n/a   Anesthesia review: yes, cardiac clearance,  pt recently reviewed by Lynwood, PA. (Rescheduled due to sickness)  Patient denies shortness of breath, fever, cough and chest pain at PAT appointment   All instructions explained to the patient, with a verbal understanding of the material. Patient agrees to go over the instructions while at home for a better understanding. The opportunity to ask questions was provided.

## 2024-08-30 NOTE — Progress Notes (Addendum)
 Left voicemail for patient and his son with new arrival time of 1330 and call back numbers [phone] and [phone].  Patient called back at 1759 and voiced understanding of new arrival time of 1330 and clear liquids okay until 1230

## 2024-08-30 NOTE — H&P (Signed)
 Chief Complaint: New Consultation (Umbilical hernia)       History of Present Illness: Benjamin Mckay is a 78 y.o. male who is seen today as an office consultation at the request of Dr. Jason for evaluation of New Consultation (Umbilical hernia) .       History of Present Illness Benjamin Mckay is a 78 year old male with atrial fibrillation and a pacemaker who presents with an umbilical hernia. He was referred by Dr. Drema for evaluation of his umbilical hernia.   He has had an umbilical hernia for over twenty years, which has recently increased in size and become red and raw since last Friday. The hernia is uncomfortable with increased sensitivity above it, but there is no associated pain. He denies nausea or vomiting.   He is on warfarin for atrial fibrillation, with a recent INR of 2.5. He has previously been off warfarin for three to four days for procedures without issues, but recalls bleeding through a scar when not taken off warfarin for a pacemaker battery replacement.   His past surgical history includes a colectomy over twenty years ago, and procedures for lung congestion and a spinal issue. He denies other intra-abdominal surgeries.     Review of Systems: A complete review of systems was obtained from the patient.  I have reviewed this information and discussed as appropriate with the patient.  See HPI as well for other ROS.   Review of Systems  Constitutional:  Negative for fever.  HENT:  Negative for congestion.   Eyes:  Negative for blurred vision.  Respiratory:  Negative for cough, shortness of breath and wheezing.   Cardiovascular:  Negative for chest pain and palpitations.  Gastrointestinal:  Negative for heartburn.  Genitourinary:  Negative for dysuria.  Musculoskeletal:  Negative for myalgias.  Skin:  Negative for rash.  Neurological:  Negative for dizziness and headaches.  Psychiatric/Behavioral:  Negative for depression and suicidal ideas.   All other  systems reviewed and are negative.       Medical History: Past Medical History Past Medical History: Diagnosis Date  Arrhythmia    CHF (congestive heart failure) (CMS/HHS-HCC)    Heart valve disease    History of cancer    History of stroke    Hyperlipidemia         Problem List There is no problem list on file for this patient.     Past Surgical History Past Surgical History: Procedure Laterality Date  APPENDECTOMY      COLON SURGERY      SPINE SURGERY          Allergies Allergies Allergen Reactions  Penicillins Rash      Medications Ordered Prior to Encounter Current Outpatient Medications on File Prior to Visit Medication Sig Dispense Refill  doxycycline  (VIBRA -TABS) 100 MG tablet Take 100 mg by mouth 2 (two) times daily      ENTRESTO  24-26 mg tablet Take 1 tablet by mouth 2 (two) times daily      JARDIANCE  10 mg tablet Take 10 mg by mouth every morning before breakfast      metoprolol  SUCCinate (TOPROL -XL) 25 MG XL tablet Take 25 mg by mouth 2 (two) times daily      pravastatin  (PRAVACHOL ) 80 MG tablet Take 80 mg by mouth once daily      REPATHA  SURECLICK 140 mg/mL PnIj INJECT 140 MG (CONTENTS OF 1 INJECTOR) INTO THE SKIN EVERY 14 (FOURTEEN) DAYS      spironolactone  (ALDACTONE ) 25 MG  tablet TAKE 1/2 TABLET (12.5 MG TOTAL) BY MOUTH EVERY OTHER DAY.      tamsulosin  (FLOMAX ) 0.4 mg capsule Take 0.4 mg by mouth at bedtime      warfarin (COUMADIN ) 2.5 MG tablet TAKE 2 TABLETS BY MOUTH DAILY EXCEPT 1.5 TABLETS ON TUESDAY, THURSDAY, AND SATURDAY OR AS DIRECTED BY THE ANTICOAGULATION CLINIC        No current facility-administered medications on file prior to visit.      Family History Family History Problem Relation Age of Onset  Stroke Mother    Coronary Artery Disease (Blocked arteries around heart) Mother        Tobacco Use History Social History    Tobacco Use Smoking Status Never Smokeless Tobacco Never      Social History Social History     Socioeconomic History  Marital status: Married Tobacco Use  Smoking status: Never  Smokeless tobacco: Never Substance and Sexual Activity  Drug use: Never    Social Drivers of Manufacturing engineer Strain: Low Risk  (08/07/2020)   Received from [facility]   Overall Financial Resource Strain (CARDIA)    Difficulty of Paying Living Expenses: Not hard at all Food Insecurity: No Food Insecurity (08/07/2020)   Received from [facility]   Hunger Vital Sign    Within the past 12 months, you worried that your food would run out before you got the money to buy more.: Never true    Within the past 12 months, the food you bought just didn't last and you didn't have money to get more.: Never true Transportation Needs: No Transportation Needs (08/07/2020)   Received from [facility] - Transportation    Lack of Transportation (Medical): No    Lack of Transportation (Non-Medical): No Physical Activity: Sufficiently Active (08/07/2020)   Received from [facility]   Exercise Vital Sign    On average, how many days per week do you engage in moderate to strenuous exercise (like a brisk walk)?: 5 days    On average, how many minutes do you engage in exercise at this level?: 30 min Stress: No Stress Concern Present (08/07/2020)   Received from [facility] - Occupational Stress Questionnaire    Feeling of Stress : Not at all Social Connections: Socially Integrated (08/07/2020)   Received from [facility]   Social Connection and Isolation Panel    In a typical week, how many times do you talk on the phone with family, friends, or neighbors?: More than three times a week    How often do you get together with friends or relatives?: More than three times a week    How often do you attend church or religious services?: More than 4 times per year    Do you belong to any clubs or organizations such as church groups, unions, fraternal or  athletic groups, or school groups?: Yes    How often do you attend meetings of the clubs or organizations you belong to?: More than 4 times per year    Are you married, widowed, divorced, separated, never married, or living with a partner?: Married Housing Stability: Unknown (06/30/2024)   Housing Stability Vital Sign    Homeless in the Last Year: No      Objective:     Vitals:   06/30/24 1038 BP: (!) 143/80 Pulse: 98 Temp: 36.7 C (98 F) SpO2: 98% Weight: 92.5 kg (204 lb) Height: 175.3 cm (5'  9)   Body mass index is 30.13 kg/m.   Physical Exam Constitutional:      General: He is not in acute distress.    Appearance: Normal appearance.  HENT:     Head: Normocephalic.     Nose: No rhinorrhea.     Mouth/Throat:     Mouth: Mucous membranes are moist.     Pharynx: Oropharynx is clear.  Eyes:     General: No scleral icterus.    Pupils: Pupils are equal, round, and reactive to light.  Cardiovascular:     Rate and Rhythm: Normal rate.     Pulses: Normal pulses.  Pulmonary:     Effort: Pulmonary effort is normal. No respiratory distress.     Breath sounds: No stridor. No wheezing.  Abdominal:     General: Abdomen is flat. There is no distension.     Tenderness: There is no abdominal tenderness. There is no guarding or rebound.     Hernia: A hernia is present. Hernia is present in the umbilical area.     Comments: Some superficial ulceration   Musculoskeletal:        General: Normal range of motion.     Cervical back: Normal range of motion and neck supple.  Skin:    General: Skin is warm and dry.     Capillary Refill: Capillary refill takes less than 2 seconds.     Coloration: Skin is not jaundiced.  Neurological:     General: No focal deficit present.     Mental Status: He is alert and oriented to person, place, and time. Mental status is at baseline.  Psychiatric:        Mood and Affect: Mood normal.        Thought Content: Thought content normal.         Judgment: Judgment normal.       Hernia Size:3cm Incarcerated: yes Initial Hernia     Assessment and Plan: Diagnoses and all orders for this visit:   Umbilical hernia without obstruction or gangrene   Chronic a-fib (CMS/HHS-HCC)     Benjamin Mckay is a 78 y.o. male  Will need to be off coumadin . Will need Cards clearance Will need to expedite surgery as he does have some ulceration/superficial erosion of the skin at the umbilical area.     1.  We will proceed to the OR for a lap ventral hernia repair with mesh. 2. All risks and benefits were discussed with the patient, to generally include infection, bleeding, damage to surrounding structures, acute and chronic nerve pain, and recurrence. Alternatives were offered and described.  All questions were answered and the patient voiced understanding of the procedure and wishes to proceed at this point.             No follow-ups on file.   Lynda Leos, MD, [facility] Surgery, [place] [facility] Minimally Invasive Surgery

## 2024-08-31 ENCOUNTER — Ambulatory Visit (HOSPITAL_COMMUNITY): Payer: Self-pay | Admitting: Physician Assistant

## 2024-08-31 ENCOUNTER — Encounter (HOSPITAL_COMMUNITY)
Admission: RE | Disposition: A | Discharge status: Home / Self Care | Payer: Self-pay | Source: Ambulatory Visit | Attending: General Surgery

## 2024-08-31 ENCOUNTER — Ambulatory Visit (HOSPITAL_COMMUNITY): Payer: Self-pay

## 2024-08-31 ENCOUNTER — Ambulatory Visit (HOSPITAL_COMMUNITY)
Admission: RE | Admit: 2024-08-31 | Discharge: 2024-08-31 | Disposition: A | Discharge status: Home / Self Care | Source: Ambulatory Visit | Attending: General Surgery | Admitting: General Surgery

## 2024-08-31 ENCOUNTER — Other Ambulatory Visit: Payer: Self-pay

## 2024-08-31 ENCOUNTER — Encounter (HOSPITAL_COMMUNITY): Payer: Self-pay | Admitting: General Surgery

## 2024-08-31 DIAGNOSIS — I482 Chronic atrial fibrillation, unspecified: Secondary | ICD-10-CM | POA: Diagnosis not present

## 2024-08-31 DIAGNOSIS — I110 Hypertensive heart disease with heart failure: Secondary | ICD-10-CM | POA: Insufficient documentation

## 2024-08-31 DIAGNOSIS — Z7901 Long term (current) use of anticoagulants: Secondary | ICD-10-CM | POA: Insufficient documentation

## 2024-08-31 DIAGNOSIS — K439 Ventral hernia without obstruction or gangrene: Secondary | ICD-10-CM | POA: Diagnosis not present

## 2024-08-31 DIAGNOSIS — I2510 Atherosclerotic heart disease of native coronary artery without angina pectoris: Secondary | ICD-10-CM

## 2024-08-31 DIAGNOSIS — Z8673 Personal history of transient ischemic attack (TIA), and cerebral infarction without residual deficits: Secondary | ICD-10-CM | POA: Insufficient documentation

## 2024-08-31 DIAGNOSIS — I25119 Atherosclerotic heart disease of native coronary artery with unspecified angina pectoris: Secondary | ICD-10-CM | POA: Diagnosis not present

## 2024-08-31 DIAGNOSIS — K432 Incisional hernia without obstruction or gangrene: Secondary | ICD-10-CM | POA: Diagnosis not present

## 2024-08-31 DIAGNOSIS — Z950 Presence of cardiac pacemaker: Secondary | ICD-10-CM | POA: Insufficient documentation

## 2024-08-31 DIAGNOSIS — Z79899 Other long term (current) drug therapy: Secondary | ICD-10-CM | POA: Diagnosis not present

## 2024-08-31 DIAGNOSIS — I5042 Chronic combined systolic (congestive) and diastolic (congestive) heart failure: Secondary | ICD-10-CM | POA: Diagnosis not present

## 2024-08-31 DIAGNOSIS — Z7984 Long term (current) use of oral hypoglycemic drugs: Secondary | ICD-10-CM | POA: Insufficient documentation

## 2024-08-31 DIAGNOSIS — I509 Heart failure, unspecified: Secondary | ICD-10-CM | POA: Diagnosis not present

## 2024-08-31 DIAGNOSIS — E1151 Type 2 diabetes mellitus with diabetic peripheral angiopathy without gangrene: Secondary | ICD-10-CM | POA: Diagnosis not present

## 2024-08-31 DIAGNOSIS — Z01818 Encounter for other preprocedural examination: Secondary | ICD-10-CM

## 2024-08-31 HISTORY — PX: INSERTION OF MESH: SHX5868

## 2024-08-31 HISTORY — PX: VENTRAL HERNIA REPAIR: SHX424

## 2024-08-31 LAB — PROTIME-INR
INR: 1.1 (ref 0.8–1.2)
Prothrombin Time: 14.8 s (ref 11.4–15.2)

## 2024-08-31 SURGERY — REPAIR, HERNIA, VENTRAL, LAPAROSCOPIC
Anesthesia: General | Site: Abdomen

## 2024-08-31 MED ORDER — ONDANSETRON HCL 4 MG/2ML IJ SOLN
INTRAMUSCULAR | Status: AC
  Filled 2024-08-31: qty 2

## 2024-08-31 MED ORDER — DROPERIDOL 2.5 MG/ML IJ SOLN: 0.6250 mg | Freq: Once | INTRAMUSCULAR | Status: DC | PRN

## 2024-08-31 MED ORDER — OXYCODONE HCL 5 MG PO TABS: 5.0000 mg | ORAL_TABLET | Freq: Once | ORAL | Status: DC | PRN

## 2024-08-31 MED ORDER — ACETAMINOPHEN 500 MG PO TABS
1000.0000 mg | ORAL_TABLET | ORAL | Status: AC
  Administered 2024-08-31: 1000 mg via ORAL
  Filled 2024-08-31: qty 2

## 2024-08-31 MED ORDER — CHLORHEXIDINE GLUCONATE 0.12 % MT SOLN
15.0000 mL | Freq: Once | OROMUCOSAL | Status: AC
  Administered 2024-08-31: 15 mL via OROMUCOSAL
  Filled 2024-08-31: qty 15

## 2024-08-31 MED ORDER — PHENYLEPHRINE HCL-NACL 20-0.9 MG/250ML-% IV SOLN
INTRAVENOUS | Status: DC | PRN
Start: 2024-08-31 — End: 2024-08-31
  Administered 2024-08-31: 40 ug/min via INTRAVENOUS

## 2024-08-31 MED ORDER — FENTANYL CITRATE (PF) 100 MCG/2ML IJ SOLN
INTRAMUSCULAR | Status: AC
  Filled 2024-08-31: qty 2

## 2024-08-31 MED ORDER — ORAL CARE MOUTH RINSE: 15.0000 mL | Freq: Once | OROMUCOSAL | Status: AC

## 2024-08-31 MED ORDER — PROPOFOL 10 MG/ML IV BOLUS
INTRAVENOUS | Status: DC | PRN
  Administered 2024-08-31: 130 mg via INTRAVENOUS

## 2024-08-31 MED ORDER — LIDOCAINE 2% (20 MG/ML) 5 ML SYRINGE
INTRAMUSCULAR | Status: AC
  Filled 2024-08-31: qty 5

## 2024-08-31 MED ORDER — LACTATED RINGERS IV SOLN: INTRAVENOUS | Status: DC

## 2024-08-31 MED ORDER — ACETAMINOPHEN 10 MG/ML IV SOLN
INTRAVENOUS | Status: AC
  Filled 2024-08-31: qty 100

## 2024-08-31 MED ORDER — BUPIVACAINE-EPINEPHRINE (PF) 0.25% -1:200000 IJ SOLN
INTRAMUSCULAR | Status: DC | PRN
  Administered 2024-08-31: 19 mL

## 2024-08-31 MED ORDER — LIDOCAINE 2% (20 MG/ML) 5 ML SYRINGE
INTRAMUSCULAR | Status: DC | PRN
  Administered 2024-08-31: 40 mg via INTRAVENOUS

## 2024-08-31 MED ORDER — CHLORHEXIDINE GLUCONATE CLOTH 2 % EX PADS: 6.0000 | MEDICATED_PAD | Freq: Once | CUTANEOUS | Status: DC

## 2024-08-31 MED ORDER — ROCURONIUM BROMIDE 10 MG/ML (PF) SYRINGE
PREFILLED_SYRINGE | INTRAVENOUS | Status: DC | PRN
  Administered 2024-08-31: 50 mg via INTRAVENOUS

## 2024-08-31 MED ORDER — VANCOMYCIN HCL IN DEXTROSE 1-5 GM/200ML-% IV SOLN
1000.0000 mg | INTRAVENOUS | Status: AC
  Administered 2024-08-31: 1000 mg via INTRAVENOUS
  Filled 2024-08-31: qty 200

## 2024-08-31 MED ORDER — FENTANYL CITRATE (PF) 250 MCG/5ML IJ SOLN
INTRAMUSCULAR | Status: DC | PRN
  Administered 2024-08-31 (×2): 50 ug via INTRAVENOUS

## 2024-08-31 MED ORDER — ACETAMINOPHEN 10 MG/ML IV SOLN: 1000.0000 mg | Freq: Once | INTRAVENOUS | Status: DC | PRN

## 2024-08-31 MED ORDER — ROCURONIUM BROMIDE 10 MG/ML (PF) SYRINGE
PREFILLED_SYRINGE | INTRAVENOUS | Status: AC
  Filled 2024-08-31: qty 10

## 2024-08-31 MED ORDER — DEXAMETHASONE SOD PHOSPHATE PF 10 MG/ML IJ SOLN
INTRAMUSCULAR | Status: DC | PRN
Start: 2024-08-31 — End: 2024-08-31
  Administered 2024-08-31: 10 mg via INTRAVENOUS

## 2024-08-31 MED ORDER — OXYCODONE HCL 5 MG/5ML PO SOLN: 5.0000 mg | Freq: Once | ORAL | Status: DC | PRN

## 2024-08-31 MED ORDER — FENTANYL CITRATE (PF) 100 MCG/2ML IJ SOLN
25.0000 ug | INTRAMUSCULAR | Status: DC | PRN
  Administered 2024-08-31 (×2): 50 ug via INTRAVENOUS

## 2024-08-31 MED ORDER — SUGAMMADEX SODIUM 200 MG/2ML IV SOLN
INTRAVENOUS | Status: DC | PRN
  Administered 2024-08-31 (×2): 100 mg via INTRAVENOUS

## 2024-08-31 MED ORDER — ONDANSETRON HCL 4 MG/2ML IJ SOLN
INTRAMUSCULAR | Status: DC | PRN
  Administered 2024-08-31: 4 mg via INTRAVENOUS

## 2024-08-31 MED ORDER — PHENYLEPHRINE 80 MCG/ML (10ML) SYRINGE FOR IV PUSH (FOR BLOOD PRESSURE SUPPORT)
PREFILLED_SYRINGE | INTRAVENOUS | Status: DC | PRN
  Administered 2024-08-31: 160 ug via INTRAVENOUS
  Administered 2024-08-31: 80 ug via INTRAVENOUS

## 2024-08-31 MED ORDER — 0.9 % SODIUM CHLORIDE (POUR BTL) OPTIME
TOPICAL | Status: DC | PRN
  Administered 2024-08-31: 1000 mL

## 2024-08-31 MED ORDER — BUPIVACAINE-EPINEPHRINE (PF) 0.25% -1:200000 IJ SOLN
INTRAMUSCULAR | Status: AC
  Filled 2024-08-31: qty 30

## 2024-08-31 MED ORDER — TRAMADOL HCL 50 MG PO TABS: 50.0000 mg | ORAL_TABLET | Freq: Four times a day (QID) | ORAL | 0 refills | Status: AC | PRN

## 2024-08-31 SURGICAL SUPPLY — 39 items
BAG COUNTER SPONGE SURGICOUNT (BAG) ×1 IMPLANT
CANISTER SUCTION 3000ML PPV (SUCTIONS) IMPLANT
CHLORAPREP W/TINT 26 (MISCELLANEOUS) ×1 IMPLANT
CLIP APPLIE 5 13 M/L LIGAMAX5 (MISCELLANEOUS) IMPLANT
COVER SURGICAL LIGHT HANDLE (MISCELLANEOUS) ×1 IMPLANT
DERMABOND ADVANCED .7 DNX12 (GAUZE/BANDAGES/DRESSINGS) ×1 IMPLANT
DEVICE SECURE STRAP 25 ABSORB (INSTRUMENTS) ×1 IMPLANT
DEVICE TROCAR PUNCTURE CLOSURE (ENDOMECHANICALS) ×1 IMPLANT
ELECTRODE REM PT RTRN 9FT ADLT (ELECTROSURGICAL) ×1 IMPLANT
GLOVE BIO SURGEON STRL SZ7.5 (GLOVE) ×2 IMPLANT
GOWN STRL REUS W/ TWL LRG LVL3 (GOWN DISPOSABLE) ×2 IMPLANT
GOWN STRL REUS W/ TWL XL LVL3 (GOWN DISPOSABLE) ×1 IMPLANT
IRRIGATION SUCT STRKRFLW 2 WTP (MISCELLANEOUS) IMPLANT
KIT BASIN OR (CUSTOM PROCEDURE TRAY) ×1 IMPLANT
KIT TURNOVER KIT B (KITS) ×1 IMPLANT
MESH VENTRALIGHT ST 4X6IN (Mesh General) IMPLANT
NDL INSUFFLATION 14GA 120MM (NEEDLE) ×1 IMPLANT
NEEDLE INSUFFLATION 14GA 120MM (NEEDLE) ×1 IMPLANT
PAD ARMBOARD POSITIONER FOAM (MISCELLANEOUS) ×2 IMPLANT
POUCH LAPAROSCOPIC INSTRUMENT (MISCELLANEOUS) ×1 IMPLANT
SCISSORS LAP 5X35 DISP (ENDOMECHANICALS) ×1 IMPLANT
SET TUBE SMOKE EVAC HIGH FLOW (TUBING) ×1 IMPLANT
SHEARS HARMONIC 36 ACE (MISCELLANEOUS) IMPLANT
SLEEVE Z-THREAD 5X100MM (TROCAR) ×1 IMPLANT
SOLN 0.9% NACL POUR BTL 1000ML (IV SOLUTION) ×1 IMPLANT
SOLN STERILE WATER BTL 1000 ML (IV SOLUTION) ×1 IMPLANT
SUT CHROMIC 2 0 SH (SUTURE) ×1 IMPLANT
SUT ETHIBOND 0 MO6 C/R (SUTURE) IMPLANT
SUT MNCRL AB 4-0 PS2 18 (SUTURE) ×1 IMPLANT
SUT NOVA 1 T20/GS 25DT (SUTURE) IMPLANT
SUT PROLENE 2 0 KS (SUTURE) IMPLANT
SUT VICRYL 0 UR6 27IN ABS (SUTURE) IMPLANT
TOWEL GREEN STERILE (TOWEL DISPOSABLE) ×1 IMPLANT
TOWEL GREEN STERILE FF (TOWEL DISPOSABLE) ×1 IMPLANT
TRAY FOLEY W/BAG SLVR 16FR ST (SET/KITS/TRAYS/PACK) IMPLANT
TRAY LAPAROSCOPIC MC (CUSTOM PROCEDURE TRAY) ×1 IMPLANT
TROCAR 11X100 Z THREAD (TROCAR) IMPLANT
TROCAR Z-THREAD OPTICAL 5X100M (TROCAR) ×1 IMPLANT
WARMER LAPAROSCOPE (MISCELLANEOUS) ×1 IMPLANT

## 2024-08-31 NOTE — Anesthesia Preprocedure Evaluation (Addendum)
 Anesthesia Evaluation  Patient identified by MRN, date of birth, ID band Patient awake    Reviewed: Allergy & Precautions, NPO status , Patient's Chart, lab work & pertinent test results  Airway Mallampati: II  TM Distance: >3 FB Neck ROM: Full    Dental  (+) Teeth Intact, Dental Advisory Given   Pulmonary neg pulmonary ROS   breath sounds clear to auscultation       Cardiovascular hypertension, Pt. on home beta blockers and Pt. on medications + angina  + CAD, + Peripheral Vascular Disease and +CHF  + dysrhythmias Atrial Fibrillation + pacemaker + Valvular Problems/Murmurs  Rhythm:Regular Rate:Normal     Neuro/Psych CVA    GI/Hepatic Neg liver ROS,GERD  ,,  Endo/Other  diabetes, Type 2, Oral Hypoglycemic Agents Hyperthyroidism   Renal/GU Renal disease     Musculoskeletal negative musculoskeletal ROS (+)    Abdominal   Peds  Hematology negative hematology ROS (+)   Anesthesia Other Findings   Reproductive/Obstetrics                              Anesthesia Physical Anesthesia Plan  ASA: 4  Anesthesia Plan: General   Post-op Pain Management: Tylenol  PO (pre-op)* and Toradol  IV (intra-op)*   Induction: Intravenous  PONV Risk Score and Plan: 3 and Ondansetron , Dexamethasone and Midazolam   Airway Management Planned: Oral ETT  Additional Equipment: None  Intra-op Plan:   Post-operative Plan: Extubation in OR  Informed Consent: I have reviewed the patients History and Physical, chart, labs and discussed the procedure including the risks, benefits and alternatives for the proposed anesthesia with the patient or authorized representative who has indicated his/her understanding and acceptance.       Plan Discussed with: CRNA  Anesthesia Plan Comments: (  78 year old male follows the cardiology for history of intermittent CHB s/p Saint Jude/Abbott PPM, pAF on warfarin, SVT,  combined heart failure, CAD, HTN, HLD, carotid stenosis ultrasound 01/2016 showed R ICA 60 to 79% and LICA 40 to 59%; patient declined serial monitoring), renal artery stenosis, CVA.  Cardiac PET/CT 05/2023 showed severe fixed basal to apical inferior perfusion defect with hypokinesis consistent with infarct, no ischemia, EF 36%.  Seen by Damien Braver, NP on 07/06/2024 for preop evaluation.  Per note, According to the Revised Cardiac Risk Index (RCRI), his Perioperative Risk of Major Cardiac Event is (%): 6.6. His Functional Capacity in METs is: 5.81 according to the Duke Activity Status Index (DASI). Therefore, based on ACC/AHA guidelines, patient would be at acceptable risk for the planned procedure without further cardiovascular testing. The patient was advised that if he develops new symptoms prior to surgery to contact our office to arrange for a follow-up visit, and he verbalized understanding. Per office protocol, patient can hold warfarin for 5 days prior to procedure. Patient will not need bridging with Lovenox (enoxaparin) around procedure. Please resume warfarin as soon as possible postprocedure, at the discretion of the surgeon.   Patient reports last dose of warfarin 07/21/2024.   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   BMP 06/29/2024 reviewed, unremarkable.  CBC 07/19/2024 reviewed, WNL.   EKG 04/24/2023: Ventricular paced rhythm.  Rate 68.   Perioperative prescription for implanted cardiac device programming per progress note 07/26/2024: Device Information:   Clinic EP Physician:  Dr. Madelyne Croitoru    Device Type:  Pacemaker Manufacturer and Phone #:  St. Jude/Abbott: [phone] Pacemaker Dependent?:  No. Date of Last Device Check:  07/20/2024      Normal Device Function?:  Yes.     Electrophysiologist's Recommendations:   Have magnet available.            Provide  continuous ECG monitoring when magnet is used or reprogramming is to be performed.             Procedure may interfere with device function.  Magnet should be placed over device during procedure.   Cardiac PET/CT 06/12/2023:     Severe fixed basal to apical inferior perfusion defect with hypokinesis in this region, consistent with infarct.  No ischemia.  No high risk findings such as TID or drop in EF with stress (EF 36% at rest, 40% at stress), and myocardial blood flow reserve is normal.  Overall, intermediate risk study due to systolic dysfunction.   EKG not recorded during study   LV perfusion is abnormal. There is no evidence of ischemia. There is evidence of infarction. Defect 1: There is a medium defect with severe reduction in uptake present in the apical to basal inferior location(s) that is fixed. There is abnormal wall motion in the defect area. Consistent with infarction. The defect is consistent with abnormal perfusion in the RCA territory.   Rest left ventricular function is abnormal. Rest EF: 36%. Stress left ventricular function is abnormal. Stress EF: 40%. End diastolic cavity size is normal. End systolic cavity size is normal.   Myocardial blood flow was computed to be 0.55ml/g/min at rest and 1.47ml/g/min at stress. Global myocardial blood flow reserve was 2.67 and was normal.   Coronary calcium was present on the attenuation correction CT images. Moderate coronary calcifications were present. Coronary calcifications were present in the left anterior descending artery, left circumflex artery and right coronary artery distribution(s).   Findings are consistent with infarction. The study is intermediate risk.   Electronically signed by Lonni Nanas, MD     TTE 04/03/2023: 1. Left ventricular ejection fraction, by estimation, is 30 to 35%. The  left ventricle has moderately decreased function. The left ventricle  demonstrates regional wall motion abnormalities (see  scoring  diagram/findings for description). There is mild  concentric left ventricular hypertrophy. Left ventricular diastolic  function could not be evaluated. There is severe hypokinesis of the left  ventricular, basal-mid inferoseptal wall. There is moderate hypokinesis of  the left ventricular, basal-mid  inferior wall.   2. Right ventricular systolic function is moderately reduced. The right  ventricular size is mildly enlarged. There is normal pulmonary artery  systolic pressure.   3. Left atrial size was moderately dilated.   4. The mitral valve is normal in structure. Trivial mitral valve  regurgitation. No evidence of mitral stenosis.   5. The aortic valve is tricuspid. There is mild calcification of the  aortic valve. There is mild thickening of the aortic valve. Aortic valve  regurgitation is mild. Aortic valve sclerosis is present, with no evidence  of aortic valve stenosis.   6. Aortic dilatation noted. There is borderline dilatation of the  ascending aorta, measuring 38 mm.   7. The inferior vena cava is normal in size with greater than 50%  respiratory variability, suggesting right atrial pressure of 3 mmHg.    Comparison(s): Changes from prior study are noted. LV with global  hypokinesis as well as focal wall motion abnormalities in the inferior and  inferoseptal walls. EF 30-35%.  )  Anesthesia Quick Evaluation

## 2024-08-31 NOTE — Anesthesia Postprocedure Evaluation (Signed)
 Anesthesia Post Note  Patient: Benjamin Mckay [facility]  Procedure(s) Performed: REPAIR, HERNIA, VENTRAL, LAPAROSCOPIC (Abdomen) INSERTION OF MESH (Abdomen)     Patient location during evaluation: PACU Anesthesia Type: General Level of consciousness: awake and alert Pain management: pain level controlled Vital Signs Assessment: post-procedure vital signs reviewed and stable Respiratory status: spontaneous breathing, nonlabored ventilation, respiratory function stable and patient connected to nasal cannula oxygen Cardiovascular status: blood pressure returned to baseline and stable Postop Assessment: no apparent nausea or vomiting Anesthetic complications: no   There were no known notable events for this encounter.  Last Vitals:  Vitals:   08/31/24 1545 08/31/24 1600  BP: (!) 141/73 (!) 146/74  Pulse: 74 78  Resp: 16 16  Temp:  36.6 C  SpO2: 95% 95%    Last Pain:  Vitals:   08/31/24 1600  TempSrc:   PainSc: 2                  Benjamin Mckay

## 2024-08-31 NOTE — Anesthesia Procedure Notes (Signed)
 Procedure Name: Intubation Date/Time: 08/31/2024 2:39 PM  Performed by: Bexley Laubach C., CRNAPre-anesthesia Checklist: Patient identified, Emergency Drugs available, Suction available, Patient being monitored and Timeout performed Patient Re-evaluated:Patient Re-evaluated prior to induction Oxygen Delivery Method: Circle system utilized Preoxygenation: Pre-oxygenation with 100% oxygen Induction Type: IV induction Ventilation: Mask ventilation without difficulty Laryngoscope Size: Mac and 4 Grade View: Grade I Tube type: Oral Tube size: 7.5 mm Number of attempts: 1 Airway Equipment and Method: Stylet Placement Confirmation: ETT inserted through vocal cords under direct vision, positive ETCO2 and breath sounds checked- equal and bilateral Secured at: 23 cm Tube secured with: Tape Dental Injury: Teeth and Oropharynx as per pre-operative assessment

## 2024-08-31 NOTE — Interval H&P Note (Signed)
 History and Physical Interval Note:  08/31/2024 1:59 PM  Benjamin Mckay [name]  has presented today for surgery, with the diagnosis of VENTRAL HERNIA.  The various methods of treatment have been discussed with the patient and family. After consideration of risks, benefits and other options for treatment, the patient has consented to  Procedure(s): REPAIR, HERNIA, VENTRAL, LAPAROSCOPIC (N/A) INSERTION OF MESH (N/A) as a surgical intervention.  The patient's history has been reviewed, patient examined, no change in status, stable for surgery.  I have reviewed the patient's chart and labs.  Questions were answered to the patient's satisfaction.     [name]

## 2024-08-31 NOTE — Discharge Instructions (Signed)

## 2024-08-31 NOTE — Op Note (Signed)
 08/31/2024  3:07 PM  PATIENT:  Benjamin Mckay  78 y.o. male  PRE-OPERATIVE DIAGNOSIS:  VENTRAL HERNIA  POST-OPERATIVE DIAGNOSIS:  INCISIONAL VENTRAL HERNIA, 2cm  PROCEDURE:  Procedure(s): REPAIR, HERNIA, VENTRAL, LAPAROSCOPIC (N/A) INSERTION OF MESH (N/A)  SURGEON:  Surgeons and Role:    * Rubin Calamity, MD - Primary  ASSISTANTS: Waddell Collier, RNFA   ANESTHESIA:   local and general  EBL:  minimal   BLOOD ADMINISTERED:none  DRAINS: none   LOCAL MEDICATIONS USED:  BUPIVICAINE   SPECIMEN:  No Specimen  DISPOSITION OF SPECIMEN:  N/A  COUNTS:  YES  TOURNIQUET:  * No tourniquets in log *  DICTATION: .Dragon Dictation  Findings: 2cm incisional hernia  Details of the procedure:  After the patient was consented patient was taken back to the operating room patient was then placed in supine position bilateral SCDs in place.  The patient was prepped and draped in the usual sterile fashion. After antibiotics were confirmed a timeout was called and all facts were verified. The Veress needle technique was used to insuflate the abdomen at Palmer's point. The abdomen was insufflated to 14 mm mercury. Subsequently a 5 mm trocar was placed a camera inserted there was no injury to any intra-abdominal organs.    There was seen to be an non-incarcerated  2cm incisional hernia.  A second camera port was in placed into the left lower quadrant.   At this the Falicform ligament was taken down with Bovie cautery maintaining hemostasis.   I proceeded to reduce the hernia contents.  The hernia sac was dissected out of the hernia and disposed.  The fascia at the hernia was reapproximated using a #0 Ethibonds x 3.  Once the hernia was cleared away, a Bard Ventralight 10x15cm  mesh was inserted into the abdomen.  The mesh was secured circumferentially with am Securestrap tacker in a double crown fashion.   The omentum was brought over the area of the mesh. The fascia at the left lower quadrant  port site was reapproximated with a 0 vicryl and an endoclose device.  The pneumoperitoneum was evacuated & all trocars  were removed. The skin was reapproximated with 4-0  Monocryl sutures in a subcuticular fashion. The skin was dressed with Dermabond.  The patient was taken to the recovery room in stable condition.  Type of repair -primary suture & mesh  Mesh overlap - 4cm  Placement of mesh -  beneath fascia and into peritoneal cavity  Size: 3cm, Primary Hernia, and Reducible Hernia   PLAN OF CARE: Discharge to home after PACU  PATIENT DISPOSITION:  PACU - hemodynamically stable.   Delay start of Pharmacological VTE agent (>24hrs) due to surgical blood loss or risk of bleeding: not applicable

## 2024-08-31 NOTE — Transfer of Care (Signed)
 Immediate Anesthesia Transfer of Care Note  Patient: Naod Sweetland [facility]  Procedure(s) Performed: REPAIR, HERNIA, VENTRAL, LAPAROSCOPIC (Abdomen) INSERTION OF MESH (Abdomen)  Patient Location: PACU  Anesthesia Type:General  Level of Consciousness: awake, alert , and oriented  Airway & Oxygen Therapy: Patient Spontanous Breathing and Patient connected to face mask oxygen  Post-op Assessment: Report given to RN and Post -op Vital signs reviewed and stable  Post vital signs: Reviewed and stable  Last Vitals:  Vitals Value Taken Time  BP 146/90 08/31/24 15:24  Temp 36.6 C 08/31/24 15:21  Pulse 88 08/31/24 15:28  Resp 19 08/31/24 15:28  SpO2 96 % 08/31/24 15:28  Vitals shown include unfiled device data.  Last Pain:  Vitals:   08/31/24 1332  TempSrc:   PainSc: 0-No pain         Complications: There were no known notable events for this encounter.

## 2024-09-01 ENCOUNTER — Encounter (HOSPITAL_COMMUNITY): Payer: Self-pay | Admitting: General Surgery

## 2024-09-02 ENCOUNTER — Ambulatory Visit: Payer: PPO

## 2024-09-02 DIAGNOSIS — I482 Chronic atrial fibrillation, unspecified: Secondary | ICD-10-CM

## 2024-09-03 LAB — CUP PACEART REMOTE DEVICE CHECK
Battery Remaining Longevity: 112 mo
Battery Remaining Percentage: 95 %
Battery Voltage: 2.99 V
Brady Statistic RV Percent Paced: 58 %
Date Time Interrogation Session: 20251120034014
Lead Channel Impedance Value: 460 Ohm
Lead Channel Pacing Threshold Amplitude: 0.75 V
Lead Channel Pacing Threshold Pulse Width: 0.5 ms
Lead Channel Sensing Intrinsic Amplitude: 6.3 mV
Lead Channel Setting Pacing Amplitude: 2.5 V
Lead Channel Setting Pacing Pulse Width: 0.5 ms
Lead Channel Setting Sensing Sensitivity: 2.5 mV
Pulse Gen Model: 1272
Pulse Gen Serial Number: 8208618

## 2024-09-06 ENCOUNTER — Ambulatory Visit: Payer: Self-pay | Admitting: Cardiovascular Disease

## 2024-09-06 NOTE — Progress Notes (Signed)
 Remote PPM Transmission

## 2024-09-15 ENCOUNTER — Ambulatory Visit: Attending: Cardiovascular Disease | Admitting: Pharmacist

## 2024-09-15 DIAGNOSIS — I482 Chronic atrial fibrillation, unspecified: Secondary | ICD-10-CM

## 2024-09-15 DIAGNOSIS — Z7901 Long term (current) use of anticoagulants: Secondary | ICD-10-CM

## 2024-09-15 LAB — POCT INR: INR: 1.9 — AB (ref 2.0–3.0)

## 2024-09-15 NOTE — Patient Instructions (Signed)
 Description   INR-1.9; Take 2 and 1/2 tablets today and then resume previous dose of 2 tablets daily except 1.5 tablets on Tuesday, Thursday, and Saturday. Repeat INR in 4 weeks.  Anticoagulation Clinic [phone]

## 2024-09-15 NOTE — Progress Notes (Signed)
 Description   INR-1.9; Take 2 and 1/2 tablets today and then resume previous dose of 2 tablets daily except 1.5 tablets on Tuesday, Thursday, and Saturday. Repeat INR in 4 weeks.  Anticoagulation Clinic [phone]      Patient declined DOAC

## 2024-10-08 ENCOUNTER — Telehealth: Payer: Self-pay

## 2024-10-08 DIAGNOSIS — I5023 Acute on chronic systolic (congestive) heart failure: Secondary | ICD-10-CM

## 2024-10-08 DIAGNOSIS — I482 Chronic atrial fibrillation, unspecified: Secondary | ICD-10-CM

## 2024-10-08 MED ORDER — METOPROLOL SUCCINATE ER 50 MG PO TB24: 50.0000 mg | ORAL_TABLET | Freq: Two times a day (BID) | ORAL | 3 refills | Status: AC

## 2024-10-08 NOTE — Telephone Encounter (Signed)
 Most likely atrial flutter with 2:1 Av conduction. Please increase metoprolol  to 50 mg twice daily.

## 2024-10-08 NOTE — Telephone Encounter (Signed)
 Croitoru, Jerel, MD to Gershon Alan BROCKS, RN    10/08/24 10:14 AM Note Most likely atrial flutter with 2:1 Av conduction. Please increase metoprolol  to 50 mg twice daily.     Spoke with patient to relay recommendations. Pt agreeable to plan. He has 25 mg tablets on hand, so he will plan to call pharmacy when he is ready to pick up new prescription. He agrees to call back if he experiences symptomatic low BPs on increased metoprolol  dose. Pt denies any questions or additional concerns at this time.

## 2024-10-08 NOTE — Telephone Encounter (Signed)
 Alert remote transmission: VHR Presenting EGM with regular R-R with V-rates 160s.  Cannot exclude ventricular driven.  6 new VHR in the past day up to ~3 min, V-rates 170-180s.   Patient has hx of this pattern.  Morphology is same as in prior presenting rhythms.  Likely 2:1 atrial flutter or 1:1 atrial tach driven HVR.  Repeat transmission this am shows irregular R-R, v-rates 120's  (AF w/RVR).   He is mildly symptomatic this am, worse last pm.  Hx of chronic AF is on warfarin.   Patient states he felt dizziness and palpitations yesterday evening and when going to bed during episodes below. Denies any missed doses of medications, no other real changes to diet, meds or general health.   He is currently on Metoprolol  25mg  bid.  BP:  (normally runs high per patient).  Consulting with in office EP providers for recommendations, consideration for increase in rate control medication and in office follow up if rates continue.

## 2024-10-11 ENCOUNTER — Telehealth: Payer: Self-pay | Admitting: *Deleted

## 2024-10-11 NOTE — Telephone Encounter (Signed)
 2 alerts received from [employer]:  10/09/24  Alert remote transmission: VHR; recurrent 1 new VHR in the past day up to ~3 min, V-rates 170s.  Cannot exclude ventricular driven.  To triage.  Follow up as scheduled. [place], [facility]   10/10/24 Alert remote transmission: VHR; recurrent 1 new VHR in the past day up to 1 min 14 sec, V-rates 178s.  Cannot exclude ventricular driven.  To triage.  Follow up as scheduled. [place], CV[facility] _______________________________________________________________________  Duplicate alerts with same morphologies  See 10/08/24 phone note  Patient knows to call with symptoms or low BP's

## 2024-10-13 ENCOUNTER — Ambulatory Visit: Admitting: *Deleted

## 2024-10-13 DIAGNOSIS — I482 Chronic atrial fibrillation, unspecified: Secondary | ICD-10-CM | POA: Diagnosis not present

## 2024-10-13 DIAGNOSIS — Z7901 Long term (current) use of anticoagulants: Secondary | ICD-10-CM | POA: Diagnosis not present

## 2024-10-13 LAB — POCT INR: INR: 2.8 (ref 2.0–3.0)

## 2024-10-13 NOTE — Progress Notes (Signed)
 Description   INR-2.8; Continue taking 2 tablets daily except 1.5 tablets on Tuesday, Thursday, and Saturday. Repeat INR in 5 weeks.  Anticoagulation Clinic [phone]

## 2024-10-13 NOTE — Patient Instructions (Signed)
 Description   INR-2.8; Continue taking 2 tablets daily except 1.5 tablets on Tuesday, Thursday, and Saturday. Repeat INR in 5 weeks.  Anticoagulation Clinic [phone]

## 2024-11-17 ENCOUNTER — Ambulatory Visit: Admitting: *Deleted

## 2024-11-17 DIAGNOSIS — Z7901 Long term (current) use of anticoagulants: Secondary | ICD-10-CM

## 2024-11-17 DIAGNOSIS — I482 Chronic atrial fibrillation, unspecified: Secondary | ICD-10-CM

## 2024-11-17 LAB — POCT INR: INR: 2.2 (ref 2.0–3.0)

## 2024-11-17 NOTE — Patient Instructions (Signed)
 Description   INR-2.2; Continue taking 2 tablets daily except 1.5 tablets on Tuesday, Thursday, and Saturday. Repeat INR in 6 weeks.  Anticoagulation Clinic [phone]

## 2024-11-17 NOTE — Progress Notes (Signed)
 Description   INR-2.2; Continue taking 2 tablets daily except 1.5 tablets on Tuesday, Thursday, and Saturday. Repeat INR in 6 weeks.  Anticoagulation Clinic [phone]

## 2024-11-18 ENCOUNTER — Telehealth: Payer: Self-pay | Admitting: Pharmacy Technician

## 2024-11-18 ENCOUNTER — Other Ambulatory Visit: Payer: Self-pay | Admitting: Cardiovascular Disease

## 2024-11-18 ENCOUNTER — Other Ambulatory Visit (HOSPITAL_COMMUNITY): Payer: Self-pay

## 2024-11-18 DIAGNOSIS — I482 Chronic atrial fibrillation, unspecified: Secondary | ICD-10-CM

## 2024-11-18 DIAGNOSIS — Z7901 Long term (current) use of anticoagulants: Secondary | ICD-10-CM

## 2024-11-18 NOTE — Telephone Encounter (Signed)
 Patient Advocate Encounter   The patient was approved for a [employer] grant that will help cover the cost of Repatha  Total amount awarded, 2500.00.  Effective: 10/19/24 - 10/18/25   APW:[id number] ERW:[id number] Hmnle:[id number] PI:[id number]   [employer] ID: [id number]   Pharmacy provided with approval and processing information. Patient informed via mychart

## 2024-11-18 NOTE — Telephone Encounter (Signed)
 Effective: 05/04/24 - 05/03/25   APW:[id number] ERW:[id number] Hmnle:[id number] PI:[id number]    Jardiance  Benjamin Mckay is still active

## 2024-12-14 ENCOUNTER — Ambulatory Visit: Admitting: Cardiovascular Disease

## 2024-12-29 ENCOUNTER — Ambulatory Visit

## 2025-01-17 ENCOUNTER — Ambulatory Visit: Admitting: Family Medicine
